# Patient Record
Sex: Male | Born: 1990
Health system: Southern US, Community
[De-identification: ages and names within clinical notes are randomized; demographics above are authoritative.]

## PROBLEM LIST (undated history)

## (undated) DIAGNOSIS — Z9889 Other specified postprocedural states: Secondary | ICD-10-CM

## (undated) DIAGNOSIS — Z87898 Personal history of other specified conditions: Secondary | ICD-10-CM

## (undated) DIAGNOSIS — I499 Cardiac arrhythmia, unspecified: Secondary | ICD-10-CM

## (undated) DIAGNOSIS — T8384XA Pain from genitourinary prosthetic devices, implants and grafts, initial encounter: Secondary | ICD-10-CM

## (undated) DIAGNOSIS — T8859XA Other complications of anesthesia, initial encounter: Secondary | ICD-10-CM

## (undated) DIAGNOSIS — R112 Nausea with vomiting, unspecified: Secondary | ICD-10-CM

## (undated) DIAGNOSIS — Z8619 Personal history of other infectious and parasitic diseases: Secondary | ICD-10-CM

## (undated) DIAGNOSIS — E039 Hypothyroidism, unspecified: Secondary | ICD-10-CM

## (undated) DIAGNOSIS — G43909 Migraine, unspecified, not intractable, without status migrainosus: Secondary | ICD-10-CM

## (undated) DIAGNOSIS — Z973 Presence of spectacles and contact lenses: Secondary | ICD-10-CM

## (undated) DIAGNOSIS — M419 Scoliosis, unspecified: Secondary | ICD-10-CM

## (undated) DIAGNOSIS — T4145XA Adverse effect of unspecified anesthetic, initial encounter: Secondary | ICD-10-CM

## (undated) DIAGNOSIS — Z87442 Personal history of urinary calculi: Secondary | ICD-10-CM

## (undated) DIAGNOSIS — N201 Calculus of ureter: Secondary | ICD-10-CM

## (undated) DIAGNOSIS — N200 Calculus of kidney: Secondary | ICD-10-CM

## (undated) DIAGNOSIS — N318 Other neuromuscular dysfunction of bladder: Secondary | ICD-10-CM

## (undated) HISTORY — PX: EXTRACORPOREAL SHOCK WAVE LITHOTRIPSY: SHX1557

## (undated) HISTORY — PX: OTHER SURGICAL HISTORY: SHX169

## (undated) HISTORY — PX: SVT ABLATION: EP1225

---
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
## 2011-01-08 ENCOUNTER — Emergency Department (HOSPITAL_COMMUNITY)
Admission: EM | Admit: 2011-01-08 | Discharge: 2011-01-08 | Payer: Self-pay | Source: Home / Self Care | Admitting: Emergency Medicine

## 2011-01-11 LAB — BASIC METABOLIC PANEL
BUN: 12 mg/dL (ref 6–23)
CO2: 26 mEq/L (ref 19–32)
Calcium: 9.5 mg/dL (ref 8.4–10.5)
Chloride: 104 mEq/L (ref 96–112)
Creatinine, Ser: 1.01 mg/dL (ref 0.4–1.5)
GFR calc Af Amer: >60 mL/min (ref >60)
GFR calc non Af Amer: >60 mL/min (ref >60)
Glucose, Bld: 109 mg/dL — ABNORMAL HIGH (ref 70–99)
Potassium: 3.8 mEq/L (ref 3.5–5.1)
Sodium: 140 mEq/L (ref 135–145)

## 2011-07-17 IMAGING — CT CT ABD-PELV W/O CM
3 of 4 series · 10 of 46 positions shown, 17 images · non-contrast
Comparison: None.

CLINICAL DATA: Left flank pain.  No history of kidney stones.

CT ABDOMEN AND PELVIS WITHOUT CONTRAST
TECHNIQUE: Multidetector CT imaging of the abdomen and pelvis was
performed following the standard protocol without intravenous
contrast.

[Series 3: lung 5.0 b60f · axial · 0.72mm/px · z∈[-110,-10]mm · 6 of 29 slices shown, 11 images]
[im 5/29  soft-tissue]
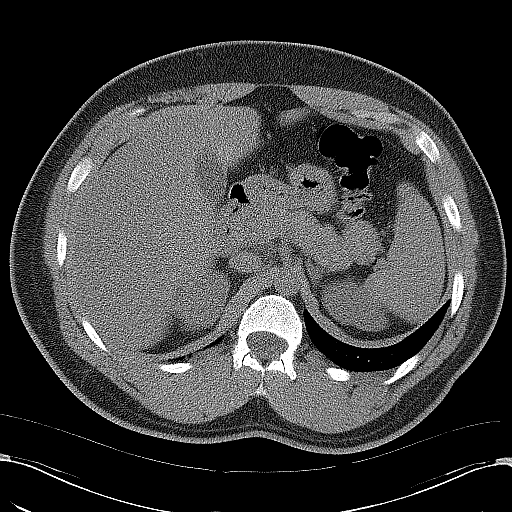
[im 5/29  bone]
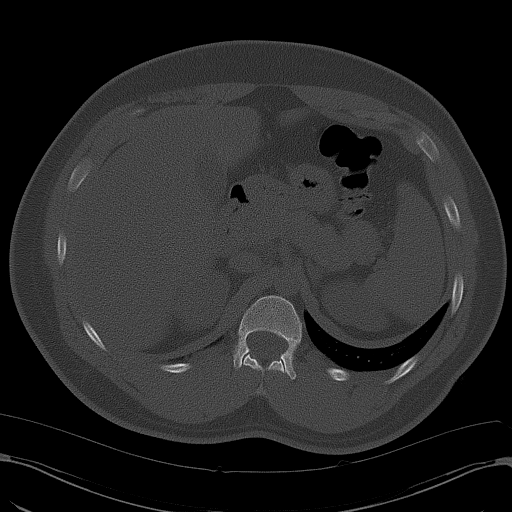
[im 9/29  soft-tissue]
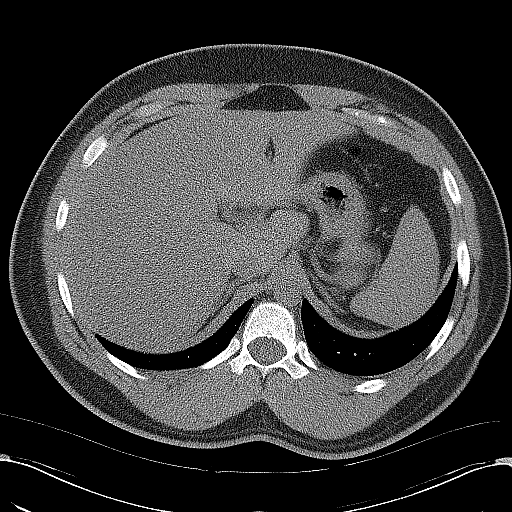
[im 13/29  soft-tissue]
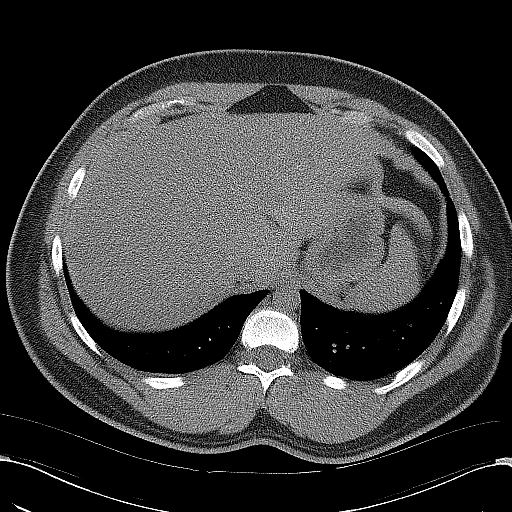
[im 13/29  lung]
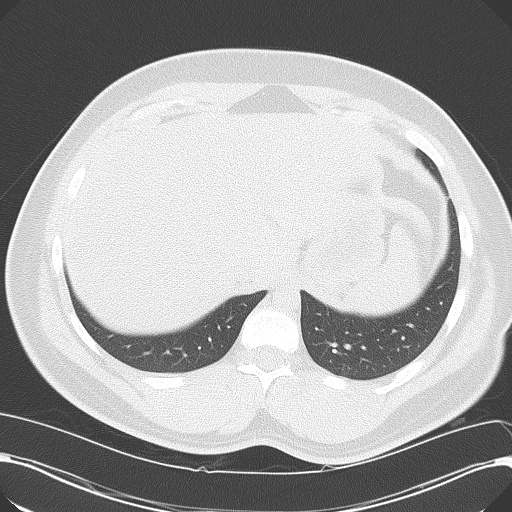
[im 17/29  soft-tissue]
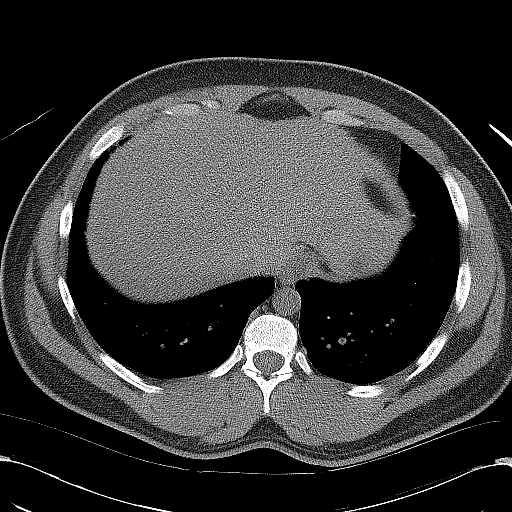
[im 17/29  lung]
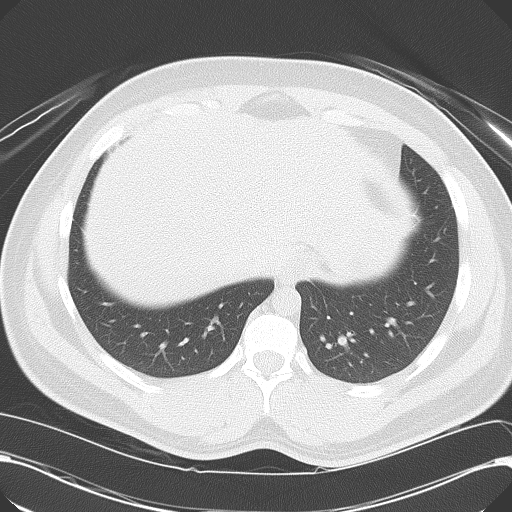
[im 21/29  soft-tissue]
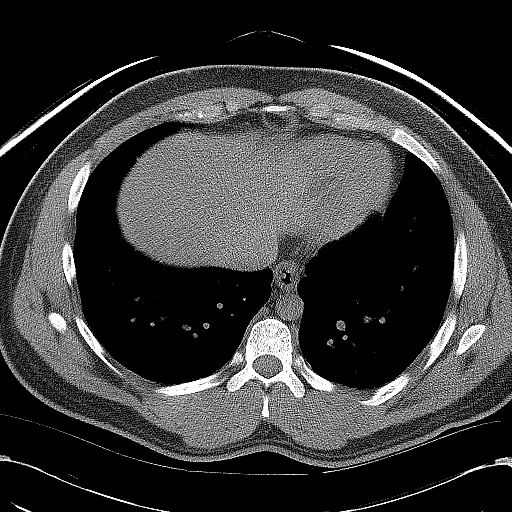
[im 21/29  lung]
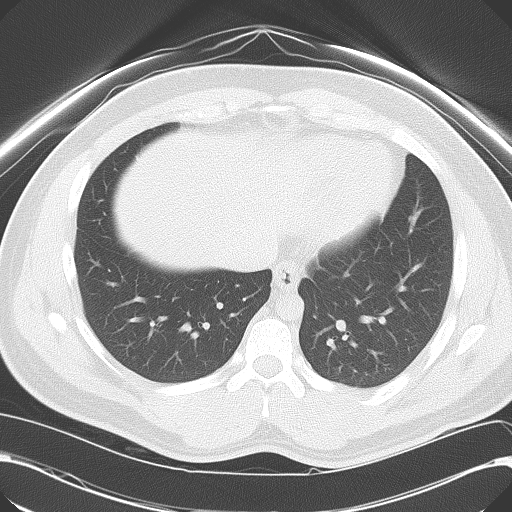
[im 25/29  soft-tissue]
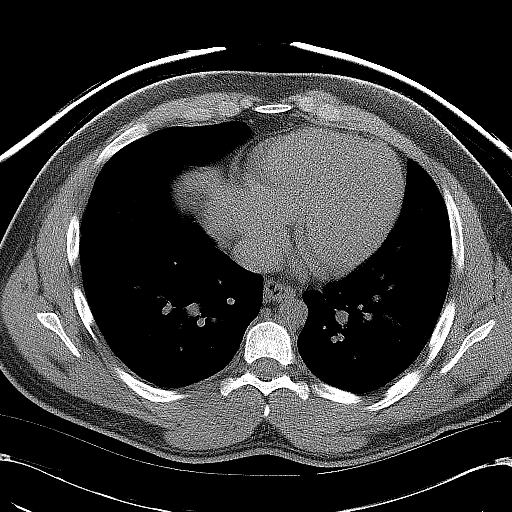
[im 25/29  lung]
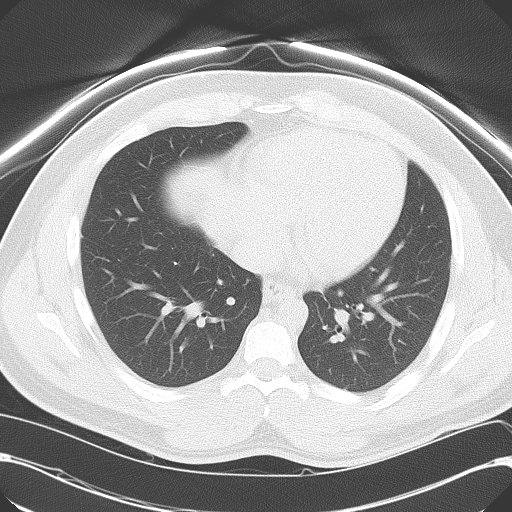

[Series 4: mpr coronal (id) · coronal · 0.80mm/px · 3 of 89 slices shown, 4 images]
[im 30/89  soft-tissue]
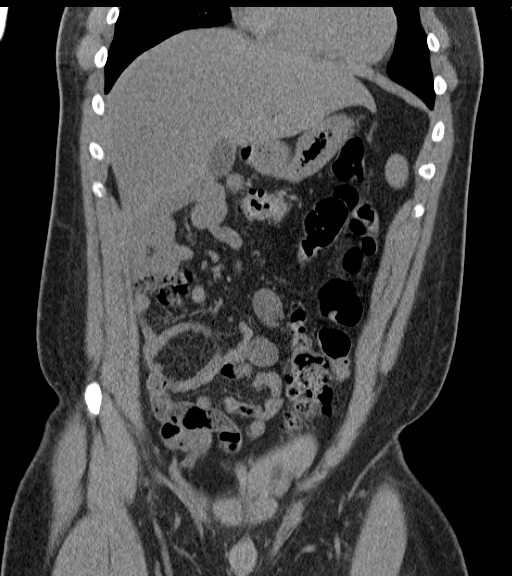
[im 40/89  soft-tissue]
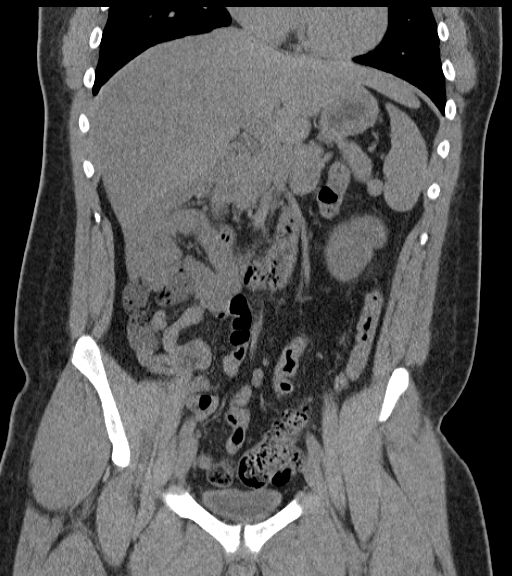
[im 40/89  bone]
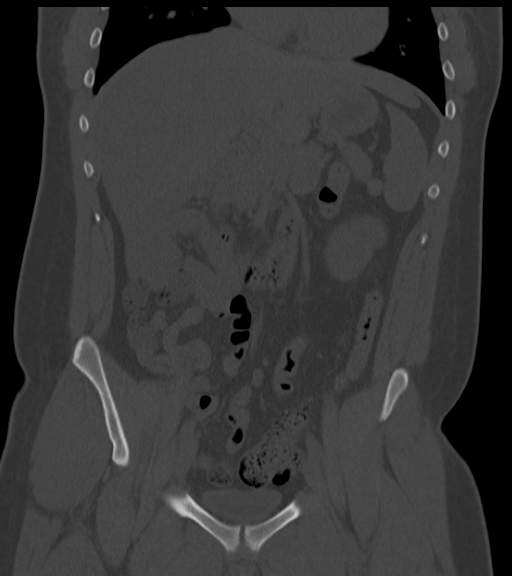
[im 49/89  soft-tissue]
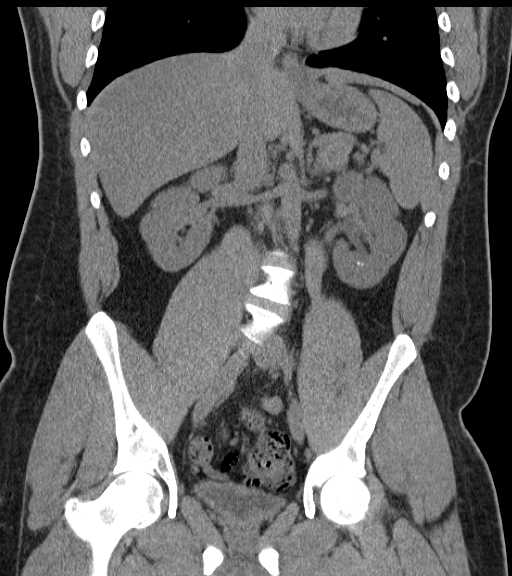

[Series 5: mpr sagittal (id) · sagittal · 0.61mm/px · 1 of 120 slices shown, 2 images]
[im 40/120  soft-tissue]
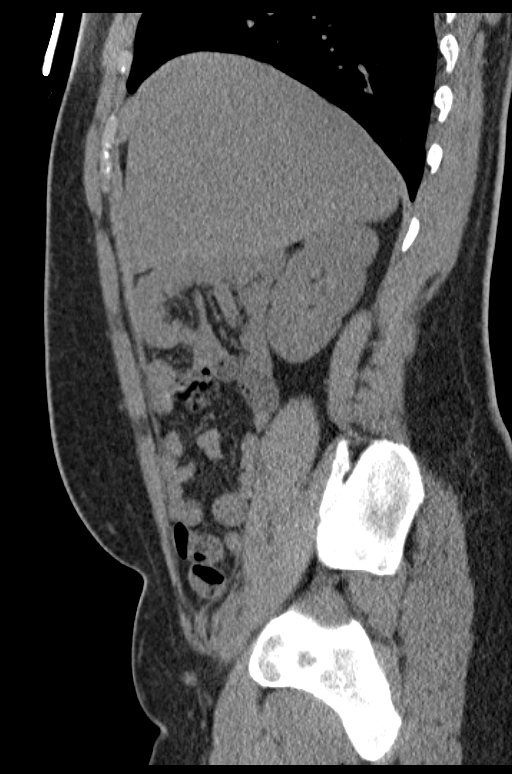
[im 40/120  bone]
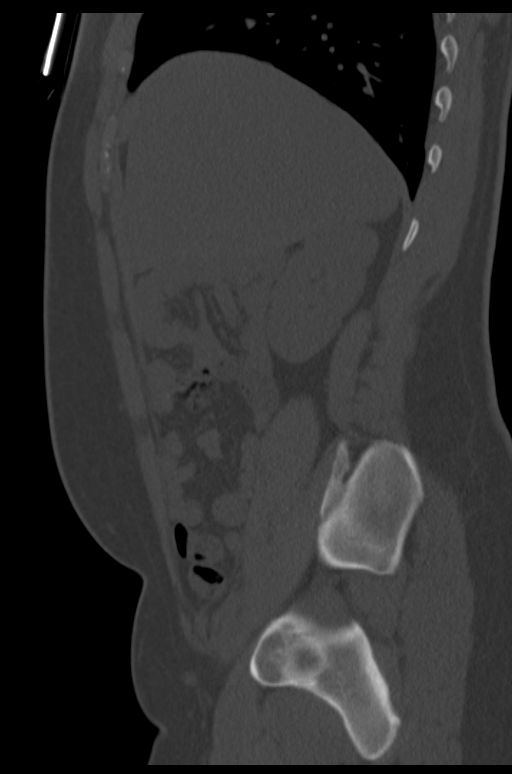

[10 of 46 positions shown; findings below may reference images not displayed]

FINDINGS: There are several calyceal calculi in the lower pole of
the left kidney, the largest measuring 6 mm on image 40.  There is
mild left-sided hydronephrosis without appreciable perinephric soft
tissue stranding.  There is a subtle 2 mm calculus in the proximal
left ureter seen on axial image 48.  This is confirmed on the
reformatted images and is located at the L4-L5 disc space level.
No right-sided renal calculi are demonstrated.

The lung bases are clear.  The liver demonstrates diffuse steatosis
without focal abnormality.  The spleen, gallbladder, pancreas and
adrenal glands appear normal.

The bowel gas pattern appears normal.  No intra-abdominal
inflammatory changes are demonstrated.

There is a moderate convex left lower lumbar scoliosis.  No focal
osseous abnormalities are identified.
IMPRESSION: 1.  2 mm obstructing proximal left ureteral calculus.
2.  Several calyceal calculi are present in the lower pole of the
left kidney.
3.  Hepatic steatosis.
4.  Convex left lumbar scoliosis.

## 2011-09-12 ENCOUNTER — Emergency Department (HOSPITAL_COMMUNITY)
Admission: EM | Admit: 2011-09-12 | Discharge: 2011-09-13 | Disposition: A | Discharge status: Home / Self Care | Payer: No Typology Code available for payment source | Attending: Emergency Medicine | Admitting: Emergency Medicine

## 2011-09-12 DIAGNOSIS — Z87442 Personal history of urinary calculi: Secondary | ICD-10-CM | POA: Insufficient documentation

## 2011-09-12 DIAGNOSIS — R110 Nausea: Secondary | ICD-10-CM | POA: Insufficient documentation

## 2011-09-12 DIAGNOSIS — R109 Unspecified abdominal pain: Secondary | ICD-10-CM | POA: Insufficient documentation

## 2011-09-12 DIAGNOSIS — N23 Unspecified renal colic: Secondary | ICD-10-CM

## 2011-09-12 MED ORDER — KETOROLAC TROMETHAMINE 30 MG/ML IJ SOLN
30.0000 mg | Freq: Once | INTRAMUSCULAR | Status: AC
  Administered 2011-09-13: 30 mg via INTRAVENOUS
  Filled 2011-09-12: qty 1

## 2011-09-12 MED ORDER — HYDROMORPHONE HCL 1 MG/ML IJ SOLN
1.0000 mg | Freq: Once | INTRAMUSCULAR | Status: AC
  Administered 2011-09-13: 1 mg via INTRAVENOUS
  Filled 2011-09-12: qty 1

## 2011-09-12 MED ORDER — ONDANSETRON HCL 4 MG/2ML IJ SOLN
4.0000 mg | Freq: Once | INTRAMUSCULAR | Status: AC
  Administered 2011-09-13: 4 mg via INTRAVENOUS
  Filled 2011-09-12: qty 2

## 2011-09-12 MED ORDER — SODIUM CHLORIDE 0.9 % IV BOLUS (SEPSIS)
1000.0000 mL | Freq: Once | INTRAVENOUS | Status: AC
  Administered 2011-09-13: 1000 mL via INTRAVENOUS

## 2011-09-12 NOTE — ED Provider Notes (Signed)
History     CSN: [id number] Arrival date & time: 09/12/2011 11:51 PM  Chief Complaint  Patient presents with  . Nephrolithiasis    HPI  (Consider location/radiation/quality/duration/timing/severity/associated sxs/prior treatment)  Patient is a 20 y.o. male presenting with flank pain. The history is provided by the patient.  Flank Pain This is a new problem. The current episode started 1 to 2 hours ago. The problem occurs constantly. Pertinent negatives include no chest pain, no headaches and no shortness of breath.   patient experienced sudden onset of left flank flank pain tonight. He has a history of kidney stones once in the past, and is followed by [facility]. He has nausea but no vomiting. Pain radiates from his flank to his left lower quadrant of his abdomen. Quality is sharp. Pain is severe. No associated hematuria or diarrhea. Duration has been constant since onset  Past Medical History  Diagnosis Date  . Kidney stone     Past Surgical History  Procedure Date  . Foot capsule release w/ percutaneous heel cord lengthening, tibial tendon transfer   . Lymph gland excision     No family history on file.  History  Substance Use Topics  . Smoking status: Never Smoker   . Smokeless tobacco: Not on file  . Alcohol Use: Yes     occ      Review of Systems  Review of Systems  Constitutional: Negative for fever and chills.  HENT: Negative for neck pain and neck stiffness.   Eyes: Negative for pain.  Respiratory: Negative for shortness of breath.   Cardiovascular: Negative for chest pain and palpitations.  Gastrointestinal: Positive for nausea. Negative for vomiting and rectal pain.  Genitourinary: Positive for flank pain. Negative for dysuria, frequency, hematuria, penile pain and testicular pain.  Musculoskeletal: Negative for back pain.  Skin: Negative for rash.  Neurological: Negative for headaches.  All other systems reviewed and are  negative.    Allergies  Codeine  Home Medications  No current outpatient prescriptions on file.  Physical Exam    BP 130/89  Pulse 75  Temp(Src) 98.3 F (36.8 C) (Oral)  Resp 20  Ht 5' 8.5" (1.74 m)  Wt 210 lb (95.255 kg)  BMI 31.47 kg/m2  SpO2 100%  Physical Exam  Constitutional: He is oriented to person, place, and time. He appears well-developed and well-nourished.  HENT:  Head: Normocephalic and atraumatic.  Eyes: Conjunctivae and EOM are normal. Pupils are equal, round, and reactive to light.  Neck: Trachea normal. Neck supple. No thyromegaly present.  Cardiovascular: Normal rate, regular rhythm, S1 normal, S2 normal and normal pulses.     No systolic murmur is present   No diastolic murmur is present  Pulses:      Radial pulses are 2+ on the right side, and 2+ on the left side.  Pulmonary/Chest: Effort normal and breath sounds normal. He has no wheezes. He has no rhonchi. He has no rales. He exhibits no tenderness.  Abdominal: Soft. Normal appearance and bowel sounds are normal. There is no CVA tenderness and negative Murphy's sign.       Pain localized to left flank without any reproducible tenderness. No guarding, no rebound, no peritonitis. No discoloration.  Neurological: He is alert and oriented to person, place, and time. He has normal strength. No cranial nerve deficit or sensory deficit. GCS eye subscore is 4. GCS verbal subscore is 5. GCS motor subscore is 6.  Skin: Skin is warm and dry. No rash noted.  He is not diaphoretic.  Psychiatric: His speech is normal.       Cooperative and appropriate    ED Course  Procedures (including critical care time)   Ct Abdomen Pelvis Wo Contrast  09/13/2011  *RADIOLOGY REPORT*  Clinical Data: Left-sided flank pain and pelvic pain; history of renal stones.  CT ABDOMEN AND PELVIS WITHOUT CONTRAST  Technique:  Multidetector CT imaging of the abdomen and pelvis was performed following the standard protocol without  intravenous contrast.  Comparison: CT of the abdomen and pelvis performed 01/08/2011  Findings: The visualized lung bases are clear.  The liver and spleen are unremarkable in appearance.  The gallbladder is within normal limits.  The pancreas and adrenal glands are unremarkable.  There is no evidence of hydronephrosis.  No obstructing ureteral stones are seen.  Two nonobstructing renal stones are noted at the lower pole of the left kidney, measuring 0.8 cm and 0.5 cm in size. Bilateral fetal lobulations are seen.  No perinephric stranding is appreciated.  No free fluid is identified.  The small bowel is grossly unremarkable in appearance; it is entirely on the right side of the abdomen.  The stomach is within normal limits.  No acute vascular abnormalities are seen.  The colon is entirely on the left side of the abdomen, compatible with malrotation.  The cecum is deep within the pelvis.  The appendix is normal in caliber, directly superior to the bladder, without evidence for appendicitis.  The colon is otherwise unremarkable in appearance.  The bladder is decompressed and not well assessed.  The prostate remains normal in size.  No inguinal lymphadenopathy is seen.  No acute osseous abnormalities are identified.  Left convex lumbar scoliosis is noted.  IMPRESSION:  1.  No evidence of hydronephrosis; no obstructing ureteral stones seen. 2.  Two nonobstructing renal stones at the lower pole of the left kidney, measuring 0.8 cm and 0.5 cm in size. 3.  Small bowel noted on the right side of the abdomen, while the colon is seen on the left side of the abdomen, compatible with malrotation.  The appendix remains normal in appearance, seen directly superior to the bladder. 4.  Left convex lumbar scoliosis again noted.  Original Report Authenticated By: Tonia Ghent, M.D.    MDM  11:55 PM patient evaluated. Presentation consistent with left ureteral colic. IV fluids and IV pain medications provided. IV Zofran for  nausea. Urinalysis obtained. CT scan obtained to further evaluate.  1:44 AM on recheck patient is feeling much better, and much more comfortable. CT scan was reviewed as above.  exam remains unchanged without any tenderness elicited on exam and no peritonitis. Plan discharge home with pain medications and followup with urologist as needed. Patient states understanding strict return precautions for any fevers, vomiting or worsening condition.      Sunnie Nielsen, MD 09/13/11 [phone]

## 2011-09-12 NOTE — ED Notes (Signed)
Left lower ab pain that started tonight, h/o stones, told he had 4 and feels like one is moving, +n/v

## 2011-09-13 ENCOUNTER — Emergency Department (HOSPITAL_COMMUNITY): Payer: No Typology Code available for payment source

## 2011-09-13 LAB — URINALYSIS, ROUTINE W REFLEX MICROSCOPIC
Bilirubin Urine: NEGATIVE
Glucose, UA: NEGATIVE mg/dL
Hgb urine dipstick: NEGATIVE
Ketones, ur: NEGATIVE mg/dL
Leukocytes, UA: NEGATIVE
Nitrite: NEGATIVE
Protein, ur: NEGATIVE mg/dL
Specific Gravity, Urine: 1.02 (ref 1.005–1.030)
Urobilinogen, UA: 0.2 mg/dL (ref 0.0–1.0)
pH: 8 (ref 5.0–8.0)

## 2011-09-13 MED ORDER — IBUPROFEN 800 MG PO TABS: 800.0000 mg | ORAL_TABLET | Freq: Three times a day (TID) | ORAL | Status: AC

## 2011-09-13 MED ORDER — ONDANSETRON HCL 4 MG PO TABS: 4.0000 mg | ORAL_TABLET | Freq: Four times a day (QID) | ORAL | Status: AC

## 2011-09-13 MED ORDER — HYDROCODONE-ACETAMINOPHEN 7.5-500 MG/15ML PO SOLN: 15.0000 mL | Freq: Four times a day (QID) | ORAL | Status: AC | PRN

## 2012-03-21 IMAGING — CT CT ABD-PELV W/O CM
2 of 3 series · 9 of 46 positions shown, 11 images · non-contrast
Comparison: CT of the abdomen and pelvis performed 01/08/2011

CLINICAL DATA: Left-sided flank pain and pelvic pain; history of
renal stones.

CT ABDOMEN AND PELVIS WITHOUT CONTRAST
TECHNIQUE: Multidetector CT imaging of the abdomen and pelvis was
performed following the standard protocol without intravenous
contrast.

[Series 4: mpr coronal (id) · coronal · 0.79mm/px · 8 of 87 slices shown, 9 images]
[im 10/87  soft-tissue]
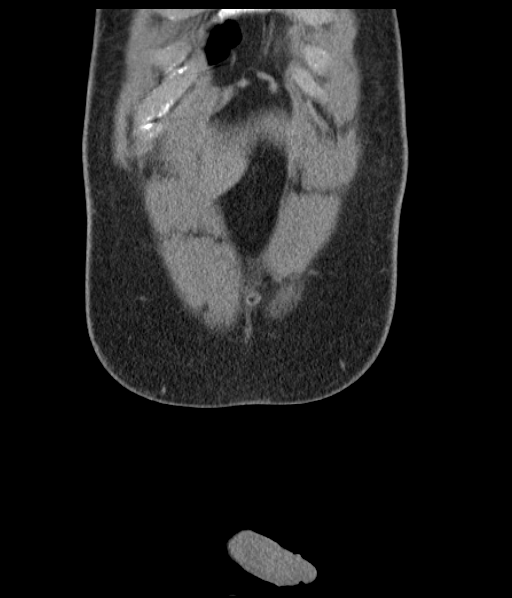
[im 10/87  bone]
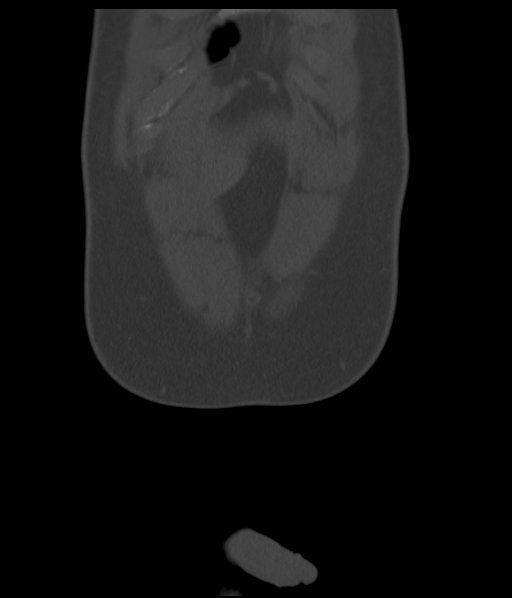
[im 20/87  soft-tissue]
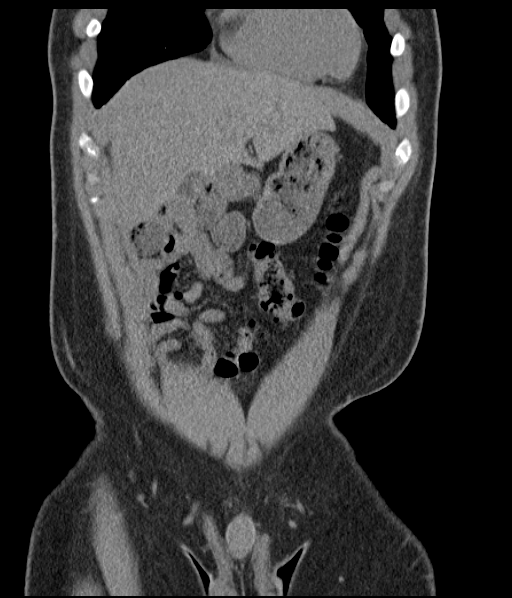
[im 29/87  soft-tissue]
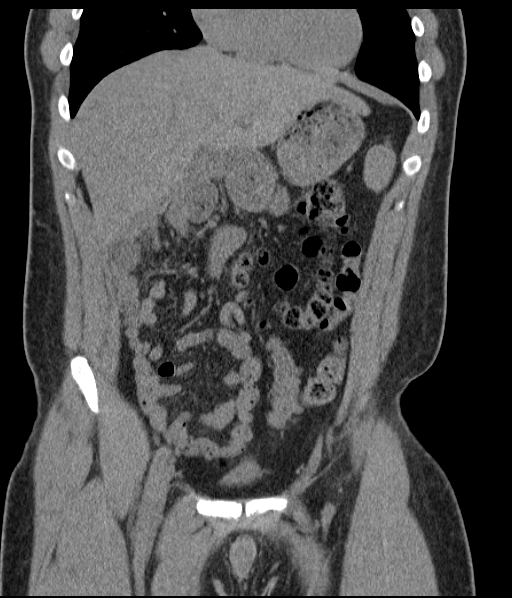
[im 39/87  soft-tissue]
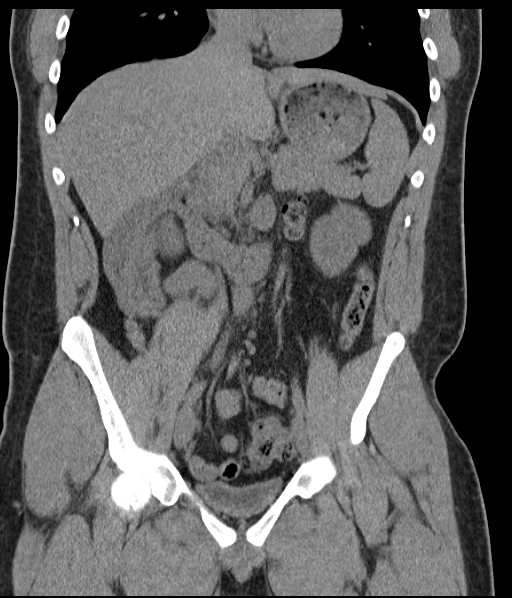
[im 48/87  soft-tissue]
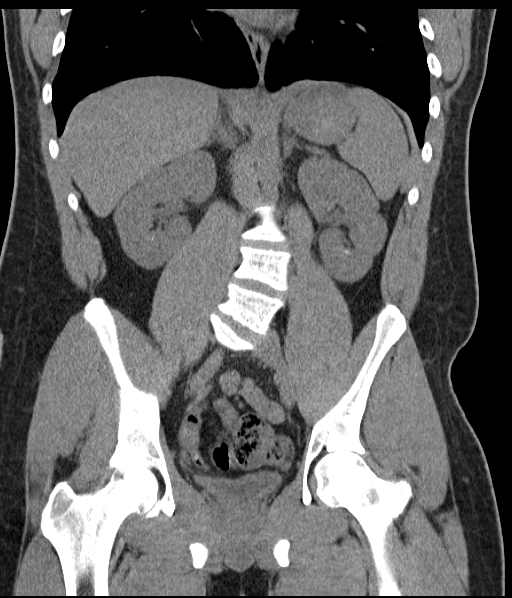
[im 58/87  soft-tissue]
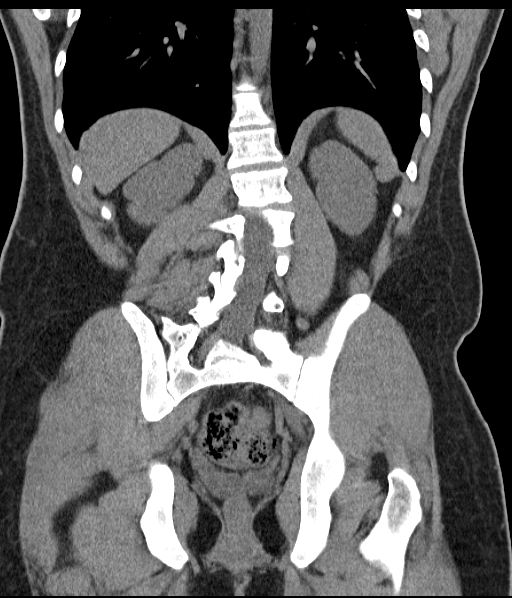
[im 67/87  soft-tissue]
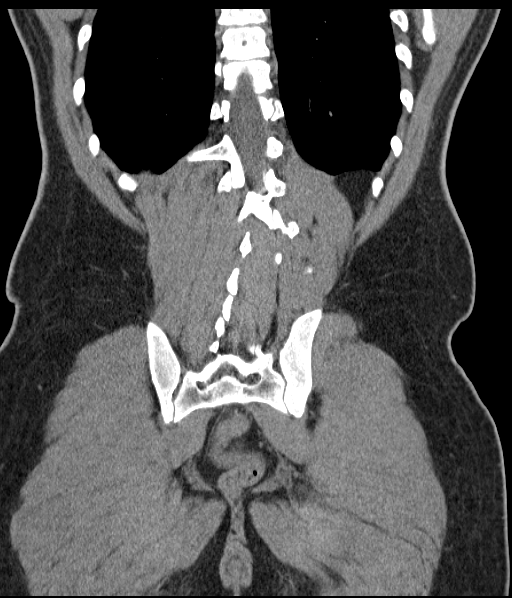
[im 77/87  soft-tissue]
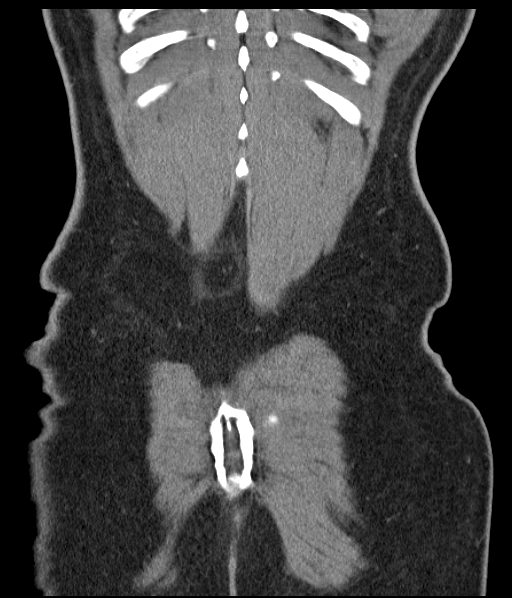

[Series 5: mpr sagittal (id) · sagittal · 0.58mm/px · 1 of 122 slices shown, 2 images]
[im 41/122  soft-tissue]
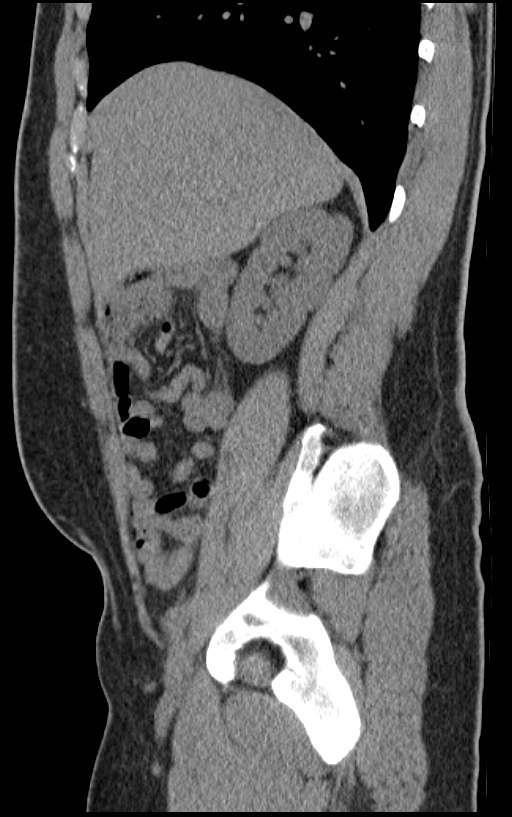
[im 41/122  bone]
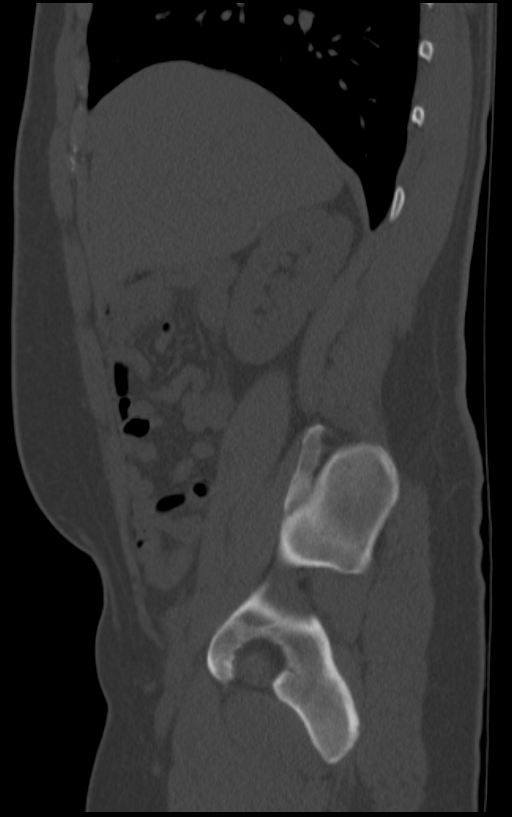

[9 of 46 positions shown; findings below may reference images not displayed]

FINDINGS: The visualized lung bases are clear.

The liver and spleen are unremarkable in appearance.  The
gallbladder is within normal limits.  The pancreas and adrenal
glands are unremarkable.

There is no evidence of hydronephrosis.  No obstructing ureteral
stones are seen.  Two nonobstructing renal stones are noted at the
lower pole of the left kidney, measuring 0.8 cm and 0.5 cm in size.
Bilateral fetal lobulations are seen.  No perinephric stranding is
appreciated.

No free fluid is identified.  The small bowel is grossly
unremarkable in appearance; it is entirely on the right side of the
abdomen.  The stomach is within normal limits.  No acute vascular
abnormalities are seen.

The colon is entirely on the left side of the abdomen, compatible
with malrotation.  The cecum is deep within the pelvis.  The
appendix is normal in caliber, directly superior to the bladder,
without evidence for appendicitis.  The colon is otherwise
unremarkable in appearance.

The bladder is decompressed and not well assessed.  The prostate
remains normal in size.  No inguinal lymphadenopathy is seen.

No acute osseous abnormalities are identified.  Left convex lumbar
scoliosis is noted.
IMPRESSION: 1.  No evidence of hydronephrosis; no obstructing ureteral stones
seen.
2.  Two nonobstructing renal stones at the lower pole of the left
kidney, measuring 0.8 cm and 0.5 cm in size.
3.  Small bowel noted on the right side of the abdomen, while the
colon is seen on the left side of the abdomen, compatible with
malrotation.  The appendix remains normal in appearance, seen
directly superior to the bladder.
4.  Left convex lumbar scoliosis again noted.

## 2013-03-17 ENCOUNTER — Emergency Department (HOSPITAL_COMMUNITY)
Admission: EM | Admit: 2013-03-17 | Discharge: 2013-03-17 | Disposition: A | Discharge status: Home / Self Care | Payer: BC Managed Care – PPO | Attending: Emergency Medicine | Admitting: Emergency Medicine

## 2013-03-17 ENCOUNTER — Encounter (HOSPITAL_COMMUNITY): Payer: Self-pay | Admitting: *Deleted

## 2013-03-17 ENCOUNTER — Emergency Department (HOSPITAL_COMMUNITY): Payer: BC Managed Care – PPO

## 2013-03-17 DIAGNOSIS — R3915 Urgency of urination: Secondary | ICD-10-CM | POA: Insufficient documentation

## 2013-03-17 DIAGNOSIS — N200 Calculus of kidney: Secondary | ICD-10-CM

## 2013-03-17 DIAGNOSIS — R112 Nausea with vomiting, unspecified: Secondary | ICD-10-CM | POA: Insufficient documentation

## 2013-03-17 LAB — URINE MICROSCOPIC-ADD ON

## 2013-03-17 LAB — URINALYSIS, ROUTINE W REFLEX MICROSCOPIC
Bilirubin Urine: NEGATIVE
Glucose, UA: NEGATIVE mg/dL
Ketones, ur: NEGATIVE mg/dL
Leukocytes, UA: NEGATIVE
Nitrite: NEGATIVE
Protein, ur: NEGATIVE mg/dL
Specific Gravity, Urine: 1.02 (ref 1.005–1.030)
Urobilinogen, UA: 0.2 mg/dL (ref 0.0–1.0)
pH: 7 (ref 5.0–8.0)

## 2013-03-17 MED ORDER — ONDANSETRON HCL 4 MG/2ML IJ SOLN
4.0000 mg | Freq: Once | INTRAMUSCULAR | Status: AC
  Administered 2013-03-17: 4 mg via INTRAVENOUS
  Filled 2013-03-17: qty 2

## 2013-03-17 MED ORDER — HYDROMORPHONE HCL PF 1 MG/ML IJ SOLN
1.0000 mg | Freq: Once | INTRAMUSCULAR | Status: AC
  Administered 2013-03-17: 1 mg via INTRAVENOUS
  Filled 2013-03-17: qty 1

## 2013-03-17 MED ORDER — PROMETHAZINE HCL 25 MG PO TABS: 25.0000 mg | ORAL_TABLET | Freq: Four times a day (QID) | ORAL | Status: DC | PRN

## 2013-03-17 MED ORDER — OXYCODONE-ACETAMINOPHEN 5-325 MG PO TABS: 2.0000 | ORAL_TABLET | ORAL | Status: DC | PRN

## 2013-03-17 NOTE — ED Notes (Signed)
Discharge instructions reviewed with pt, questions answered. Pt verbalized understanding.  

## 2013-03-17 NOTE — ED Provider Notes (Signed)
History  This chart was scribed for Donnetta Hutching, MD by Erskine Emery, ED Scribe. This patient was seen in room APA12/APA12 and the patient's care was started at 07:35.   CSN: [id number]  Arrival date & time 03/17/13  0718   First MD Initiated Contact with Patient 03/17/13 [phone]      Chief Complaint  Patient presents with  . Flank Pain  . Nausea    (Consider location/radiation/quality/duration/timing/severity/associated sxs/prior treatment) The history is provided by the patient and a relative. No language interpreter was used.  Thomas Howell is a 22 y.o. male who presents to the Emergency Department complaining of sudden onset urinary urgency and sharp left flank pain that radiates to the LLQ of the abdomen since just PTA. Pt reports some associated nausea and emesis but denies any hematuria. Pt has a h/o 4 kidney stones and states this episode feels similar. Pt is allergic to codeine but has taken Dilaudid successfully for previous episodes. Pt reports he has had a lot of CT scans in his life for various issues. Pt has no other medical conditions or complaints at this time.  Past Medical History  Diagnosis Date  . Kidney stone     Past Surgical History  Procedure Laterality Date  . Foot capsule release w/ percutaneous heel cord lengthening, tibial tendon transfer    . Lymph gland excision      History reviewed. No pertinent family history.  History  Substance Use Topics  . Smoking status: Never Smoker   . Smokeless tobacco: Not on file  . Alcohol Use: Yes     Comment: occ      Review of Systems A complete 10 system review of systems was obtained and all systems are negative except as noted in the HPI and PMH.    Allergies  Codeine  Home Medications  No current outpatient prescriptions on file.  There were no vitals taken for this visit.  Physical Exam  Nursing note and vitals reviewed. Constitutional: He is oriented to person, place, and time. He appears  well-developed and well-nourished.  HENT:  Head: Normocephalic and atraumatic.  Eyes: Conjunctivae and EOM are normal. Pupils are equal, round, and reactive to light.  Neck: Normal range of motion. Neck supple.  Cardiovascular: Normal rate, regular rhythm and normal heart sounds.   Pulmonary/Chest: Effort normal and breath sounds normal.  Abdominal: Soft. Bowel sounds are normal.  Genitourinary:  Tender in left flank that radiates into LLQ.  Musculoskeletal: Normal range of motion.  Neurological: He is alert and oriented to person, place, and time.  Skin: Skin is warm and dry.  Psychiatric: He has a normal mood and affect.    ED Course  Procedures (including critical care time) DIAGNOSTIC STUDIES:  COORDINATION OF CARE: 07:55--I evaluated the patient and we discussed a treatment plan including urinalysis, Zofran, and pain medication (Dilaudid) to which the pt agreed.   Results for orders placed during the hospital encounter of 03/17/13  URINALYSIS, ROUTINE W REFLEX MICROSCOPIC      Result Value Range   Color, Urine YELLOW  YELLOW   APPearance CLEAR  CLEAR   Specific Gravity, Urine 1.020  1.005 - 1.030   pH 7.0  5.0 - 8.0   Glucose, UA NEGATIVE  NEGATIVE mg/dL   Hgb urine dipstick LARGE (*) NEGATIVE   Bilirubin Urine NEGATIVE  NEGATIVE   Ketones, ur NEGATIVE  NEGATIVE mg/dL   Protein, ur NEGATIVE  NEGATIVE mg/dL   Urobilinogen, UA 0.2  0.0 -  1.0 mg/dL   Nitrite NEGATIVE  NEGATIVE   Leukocytes, UA NEGATIVE  NEGATIVE  URINE MICROSCOPIC-ADD ON      Result Value Range   Squamous Epithelial / LPF RARE  RARE   WBC, UA 0-2  <3 WBC/hpf   RBC / HPF 21-50  <3 RBC/hpf   Bacteria, UA FEW (*) RARE   Dg Abd 1 View  03/17/2013  *RADIOLOGY REPORT*  Clinical Data: Left flank pain and nausea.  History of urolithiasis.  ABDOMEN - 1 VIEW  Comparison: CT urogram 09/13/2011.  Findings: Left renal calculi are again noted.  There is a new faint radiodensity in the left pelvis, measuring 2 mm.   This was not seen on the prior CT and could reflect a small distal ureteral calculus. The bowel gas pattern is normal.  A moderate convex left scoliosis is again noted.  IMPRESSION: Possible new small left pelvic calcification suspicious for urolithiasis.  Correlate clinically.  Grossly stable left renal calculi.   Original Report Authenticated By: Carey Bullocks, M.D.       No diagnosis found.    MDM  History and physical suggest stone.  21-50 red cells in urine.  Will avoid CT scan secondary to multiple scans in the past.  Patient is stable after pain management. Discharge meds Percocet #20 and Phenergan 25 mg #20      I personally performed the services described in this documentation, which was scribed in my presence. The recorded information has been reviewed and is accurate.    Donnetta Hutching, MD 03/17/13 [phone]

## 2013-03-17 NOTE — ED Notes (Signed)
Pt co pain to lt flank with N/V. Pt has HX of kidney stones states it feels the same way as past events.

## 2013-03-17 NOTE — ED Notes (Signed)
Pt has a history of kidney stones. The patient states he has nausea and vomiting, and left flank pain radiating to back and groin area.

## 2014-02-05 ENCOUNTER — Emergency Department (HOSPITAL_COMMUNITY)
Admission: EM | Admit: 2014-02-05 | Discharge: 2014-02-05 | Disposition: A | Discharge status: Home / Self Care | Payer: No Typology Code available for payment source | Attending: Emergency Medicine | Admitting: Emergency Medicine

## 2014-02-05 ENCOUNTER — Encounter (HOSPITAL_COMMUNITY): Payer: Self-pay | Admitting: Emergency Medicine

## 2014-02-05 DIAGNOSIS — R1110 Vomiting, unspecified: Secondary | ICD-10-CM | POA: Insufficient documentation

## 2014-02-05 DIAGNOSIS — Z87442 Personal history of urinary calculi: Secondary | ICD-10-CM | POA: Insufficient documentation

## 2014-02-05 DIAGNOSIS — R109 Unspecified abdominal pain: Secondary | ICD-10-CM | POA: Insufficient documentation

## 2014-02-05 DIAGNOSIS — R319 Hematuria, unspecified: Secondary | ICD-10-CM | POA: Insufficient documentation

## 2014-02-05 DIAGNOSIS — Z8679 Personal history of other diseases of the circulatory system: Secondary | ICD-10-CM | POA: Insufficient documentation

## 2014-02-05 HISTORY — DX: Migraine, unspecified, not intractable, without status migrainosus: G43.909

## 2014-02-05 LAB — URINALYSIS, ROUTINE W REFLEX MICROSCOPIC
Glucose, UA: NEGATIVE mg/dL
Ketones, ur: NEGATIVE mg/dL
Leukocytes, UA: NEGATIVE
Nitrite: NEGATIVE
Protein, ur: 30 mg/dL — AB
Specific Gravity, Urine: 1.02 (ref 1.005–1.030)
Urobilinogen, UA: 0.2 mg/dL (ref 0.0–1.0)
pH: 7 (ref 5.0–8.0)

## 2014-02-05 LAB — URINE MICROSCOPIC-ADD ON

## 2014-02-05 MED ORDER — ONDANSETRON HCL 4 MG/2ML IJ SOLN
4.0000 mg | Freq: Once | INTRAMUSCULAR | Status: AC
  Administered 2014-02-05: 4 mg via INTRAVENOUS
  Filled 2014-02-05: qty 2

## 2014-02-05 MED ORDER — KETOROLAC TROMETHAMINE 30 MG/ML IJ SOLN
30.0000 mg | Freq: Once | INTRAMUSCULAR | Status: AC
  Administered 2014-02-05: 30 mg via INTRAVENOUS
  Filled 2014-02-05: qty 1

## 2014-02-05 MED ORDER — ONDANSETRON 8 MG PO TBDP: 8.0000 mg | ORAL_TABLET | Freq: Once | ORAL | Status: DC

## 2014-02-05 MED ORDER — OXYCODONE-ACETAMINOPHEN 5-325 MG PO TABS: 1.0000 | ORAL_TABLET | ORAL | Status: DC | PRN

## 2014-02-05 MED ORDER — OXYCODONE-ACETAMINOPHEN 5-325 MG PO TABS: 1.0000 | ORAL_TABLET | Freq: Once | ORAL | Status: DC

## 2014-02-05 MED ORDER — ONDANSETRON HCL 8 MG PO TABS: 8.0000 mg | ORAL_TABLET | Freq: Three times a day (TID) | ORAL | Status: DC | PRN

## 2014-02-05 MED ORDER — HYDROMORPHONE HCL PF 1 MG/ML IJ SOLN
1.0000 mg | Freq: Once | INTRAMUSCULAR | Status: AC
  Administered 2014-02-05: 1 mg via INTRAVENOUS
  Filled 2014-02-05: qty 1

## 2014-02-05 NOTE — ED Provider Notes (Signed)
CSN: [id number]     Arrival date & time 02/05/14  1356 History   First MD Initiated Contact with Patient 02/05/14 1532     Chief Complaint  Patient presents with  . Flank Pain      Patient is a 23 y.o. male presenting with flank pain. The history is provided by the patient.  Flank Pain This is a recurrent problem. The current episode started 6 to 12 hours ago. The problem occurs constantly. The problem has been gradually worsening. Associated symptoms include abdominal pain. Pertinent negatives include no chest pain and no shortness of breath. Exacerbated by: palpation. The symptoms are relieved by rest. He has tried rest for the symptoms. The treatment provided mild relief.    Past Medical History  Diagnosis Date  . Kidney stone   . Migraine    Past Surgical History  Procedure Laterality Date  . Foot capsule release w/ percutaneous heel cord lengthening, tibial tendon transfer    . Lymph gland excision     History reviewed. No pertinent family history. History  Substance Use Topics  . Smoking status: Never Smoker   . Smokeless tobacco: Never Used  . Alcohol Use: Yes     Comment: occ    Review of Systems  Constitutional: Negative for fever.  Respiratory: Negative for shortness of breath.   Cardiovascular: Negative for chest pain.  Gastrointestinal: Positive for vomiting and abdominal pain.  Genitourinary: Positive for flank pain.  All other systems reviewed and are negative.      Allergies  Codeine and Tape  Home Medications   Current Outpatient Rx  Name  Route  Sig  Dispense  Refill  . naproxen sodium (ALEVE) 220 MG tablet   Oral   Take 660 mg by mouth 2 (two) times daily as needed (pain).          BP 124/76  Pulse 79  Temp(Src) 97.7 F (36.5 C) (Oral)  Resp 18  Ht 5\' 9"  (1.753 m)  Wt 210 lb (95.255 kg)  BMI 31.00 kg/m2  SpO2 99% Physical Exam CONSTITUTIONAL: Well developed/well nourished, pt actively vomiting HEAD:  Normocephalic/atraumatic EYES: EOMI/PERRL ENMT: Mucous membranes moist NECK: supple no meningeal signs SPINE:entire spine nontender CV: S1/S2 noted, no murmurs/rubs/gallops noted LUNGS: Lungs are clear to auscultation bilaterally, no apparent distress ABDOMEN: soft, nontender, no rebound or guarding YN:WGNFA cva tenderness, no inguinal hernia, and no scrotal tenderness noted NEURO: Pt is awake/alert, moves all extremitiesx4 EXTREMITIES: pulses normal, full ROM SKIN: warm, color normal PSYCH: no abnormalities of mood noted  ED Course  Procedures (including critical care time) 4:07 PM Pt with h/o kidney stones reports similar type pain here with nausea/vomiting Previous CT imaging reveals nephrolithiasis though usually left sided Will treat pain and reasses 5:20 PM Pt feels improved, no distress noted Do not feel emergent imaging needed due to h/o kidney stones with similar type pain and hematuria We discussed strict return precautions He reports he has taken percocet in past without issue (this was given previously per records)  Labs Review Labs Reviewed  URINALYSIS, ROUTINE W REFLEX MICROSCOPIC - Abnormal; Notable for the following:    Color, Urine RED (*)    APPearance HAZY (*)    Hgb urine dipstick LARGE (*)    Bilirubin Urine SMALL (*)    Protein, ur 30 (*)    All other components within normal limits  URINE MICROSCOPIC-ADD ON - Abnormal; Notable for the following:    Bacteria, UA FEW (*)    All  other components within normal limits     MDM   Final diagnoses:  Flank pain  Hematuria    Nursing notes including past medical history and social history reviewed and considered in documentation Previous records reviewed and considered Labs/vital reviewed and considered     Joya Gaskinsonald W Birch Farino, MD 02/05/14 1721

## 2014-02-05 NOTE — ED Notes (Signed)
Patient c/o pain in right flank that radiates into right lower abd pain. Patient reports dark urine. Patient reports nausea and vomiting. Denies any fevers or diarrhea. Per patient burning with urination. Patient reports hx of kidney stones.

## 2014-02-08 ENCOUNTER — Encounter (HOSPITAL_COMMUNITY): Payer: Self-pay | Admitting: Emergency Medicine

## 2014-02-08 ENCOUNTER — Emergency Department (HOSPITAL_COMMUNITY): Payer: No Typology Code available for payment source

## 2014-02-08 ENCOUNTER — Emergency Department (HOSPITAL_COMMUNITY)
Admission: EM | Admit: 2014-02-08 | Discharge: 2014-02-09 | Disposition: A | Discharge status: Home / Self Care | Payer: Self-pay | Attending: Emergency Medicine | Admitting: Emergency Medicine

## 2014-02-08 DIAGNOSIS — N23 Unspecified renal colic: Secondary | ICD-10-CM

## 2014-02-08 DIAGNOSIS — N200 Calculus of kidney: Secondary | ICD-10-CM | POA: Insufficient documentation

## 2014-02-08 DIAGNOSIS — Z8679 Personal history of other diseases of the circulatory system: Secondary | ICD-10-CM | POA: Insufficient documentation

## 2014-02-08 DIAGNOSIS — R112 Nausea with vomiting, unspecified: Secondary | ICD-10-CM | POA: Insufficient documentation

## 2014-02-08 MED ORDER — TAMSULOSIN HCL 0.4 MG PO CAPS
ORAL_CAPSULE | ORAL | Status: AC
  Filled 2014-02-08: qty 2

## 2014-02-08 MED ORDER — ONDANSETRON 4 MG PO TBDP: 4.0000 mg | ORAL_TABLET | Freq: Once | ORAL | Status: DC

## 2014-02-08 MED ORDER — SODIUM CHLORIDE 0.9 % IV SOLN
Freq: Once | INTRAVENOUS | Status: AC
  Administered 2014-02-08: 23:00:00 via INTRAVENOUS

## 2014-02-08 MED ORDER — ONDANSETRON HCL 4 MG/2ML IJ SOLN
4.0000 mg | Freq: Once | INTRAMUSCULAR | Status: AC
  Administered 2014-02-08: 4 mg via INTRAVENOUS

## 2014-02-08 MED ORDER — TAMSULOSIN HCL 0.4 MG PO CAPS
0.8000 mg | ORAL_CAPSULE | Freq: Once | ORAL | Status: AC
  Administered 2014-02-08: 0.8 mg via ORAL

## 2014-02-08 MED ORDER — ONDANSETRON HCL 4 MG/2ML IJ SOLN
4.0000 mg | Freq: Once | INTRAMUSCULAR | Status: DC
  Filled 2014-02-08: qty 2

## 2014-02-08 MED ORDER — HYDROMORPHONE HCL PF 1 MG/ML IJ SOLN
INTRAMUSCULAR | Status: AC
  Administered 2014-02-08: 1 mg via INTRAVENOUS
  Filled 2014-02-08: qty 1

## 2014-02-08 MED ORDER — HYDROMORPHONE HCL PF 1 MG/ML IJ SOLN
1.0000 mg | Freq: Once | INTRAMUSCULAR | Status: AC
  Administered 2014-02-08: 1 mg via INTRAVENOUS

## 2014-02-08 MED ORDER — HYDROMORPHONE HCL PF 1 MG/ML IJ SOLN
1.0000 mg | Freq: Once | INTRAMUSCULAR | Status: AC
  Administered 2014-02-08: 1 mg via INTRAVENOUS
  Filled 2014-02-08: qty 1

## 2014-02-08 NOTE — ED Notes (Signed)
Right flank pain, nausea and vomiting. Was here for the same this week per pt. Possible kidney stone.

## 2014-02-08 NOTE — ED Notes (Signed)
PT C/O RT LOWER QUADRANT ABDOMINAL AND RT FLANK PAIN STARTING AT 1830 TONIGHT. STATES THAT HE WAS HERE ON Tuesday FOR SAME THING AND THEY SENT HIM HOME ON PERCOCET WHICH EASED THE PAIN AND PT HAD ACTUALLY BEEN PAIN FREE FOR LAST COUPLE OF DAYS UNTIL TONIGHT AFTER HE GOT BACK FROM [place]. TOOK A ZOFRAN FOR NAUSEA AND A PERCOCET FOR PAIN AND VOMITED BOTH UP. RATES PAIN AS 10/10 NOW. WILL CONTINUE TO MONITOR.

## 2014-02-09 LAB — URINE MICROSCOPIC-ADD ON

## 2014-02-09 LAB — URINALYSIS, ROUTINE W REFLEX MICROSCOPIC
Bilirubin Urine: NEGATIVE
Glucose, UA: NEGATIVE mg/dL
Leukocytes, UA: NEGATIVE
Nitrite: NEGATIVE
Specific Gravity, Urine: 1.015 (ref 1.005–1.030)
Urobilinogen, UA: 0.2 mg/dL (ref 0.0–1.0)
pH: 7.5 (ref 5.0–8.0)

## 2014-02-09 MED ORDER — OXYCODONE-ACETAMINOPHEN 5-325 MG PO TABS: 1.0000 | ORAL_TABLET | Freq: Four times a day (QID) | ORAL | Status: DC | PRN

## 2014-02-09 MED ORDER — KETOROLAC TROMETHAMINE 30 MG/ML IJ SOLN
30.0000 mg | Freq: Once | INTRAMUSCULAR | Status: AC
  Administered 2014-02-09: 30 mg via INTRAVENOUS
  Filled 2014-02-09: qty 1

## 2014-02-09 MED ORDER — KETOROLAC TROMETHAMINE 10 MG PO TABS: 10.0000 mg | ORAL_TABLET | Freq: Three times a day (TID) | ORAL | Status: DC

## 2014-02-09 MED ORDER — TAMSULOSIN HCL 0.4 MG PO CAPS: 0.4000 mg | ORAL_CAPSULE | Freq: Every day | ORAL | Status: DC

## 2014-02-09 MED ORDER — PROMETHAZINE HCL 25 MG/ML IJ SOLN
12.5000 mg | Freq: Once | INTRAMUSCULAR | Status: AC
  Administered 2014-02-09: 12.5 mg via INTRAVENOUS
  Filled 2014-02-09: qty 1

## 2014-02-09 NOTE — ED Notes (Signed)
C/o tenderness at IV site.  Aspirates and flushes easily.  Applied ice pack to site.

## 2014-02-09 NOTE — Discharge Instructions (Signed)
Please add Flomax and Toradol to your current Percocet prescription. Please strain all urine. Please call our licensed urology on Monday for office evaluation of your kidney stones. Please return to the emergency department if pain not responding to the medications. Ureteral Colic Ureteral colic is spasm-like pain from the kidney or the ureter. This is often caused by a kidney stone. The pain is caused by the stone trying to get through the tubes that pass your pee. HOME CARE   Drink enough fluids to keep your pee (urine) clear or pale yellow.  Strain all your pee. A strainer will be provided. Keep anything caught in the strainer and bring it to your doctor. The stone causing the pain may be very small.  Only take medicine as told by your doctor.  Follow up with your doctor as told. GET HELP RIGHT AWAY IF:   Pain is not controlled with medicine.  Pain continues or gets worse.  The pain changes and there is chest or belly (abdominal) pain.  You pass out (faint).  You cannot pee.  You keep throwing up (vomiting).  You have a temperature by mouth above 102 F (38.9 C), not controlled by medicine. MAKE SURE YOU:   Understand these instructions.  Will watch this condition.  Will get help right away if you are not doing well or get worse. Document Released: 05/24/2008 Document Revised: 02/28/2012 Document Reviewed: 05/24/2008 [facility] HospitalExitCare Patient Information 2014 [place] Land[place]ExitCare, [place].

## 2014-02-09 NOTE — ED Provider Notes (Signed)
CSN: [id number]     Arrival date & time 02/08/14  2150 History   First MD Initiated Contact with Patient 02/08/14 2202     Chief Complaint  Patient presents with  . Flank Pain     (Consider location/radiation/quality/duration/timing/severity/associated sxs/prior Treatment) HPI Comments: Patient is a 23 year old male who presents to the emergency department with right flank pain. The patient states that he has a history of kidney stones. He states he was seen here in the emergency department a few days ago, he was given medication was feeling better but today he began to have increasing flank pain. This pain was accompanied by nausea or vomiting. He eventually was unable to keep down his medications, and came to the emergency department for assistance with nausea vomiting and pain. No reported fevers today. He has not seen blood in his urine today, but did see blood in his urine a couple days ago. He has tried Percocet for his pain, but states he cannot keep it down long enough for it to work for him.  Patient is a 23 y.o. male presenting with flank pain. The history is provided by the patient.  Flank Pain This is a recurrent problem. Pertinent negatives include no abdominal pain, arthralgias, chest pain, coughing or neck pain.    Past Medical History  Diagnosis Date  . Kidney stone   . Migraine    Past Surgical History  Procedure Laterality Date  . Foot capsule release w/ percutaneous heel cord lengthening, tibial tendon transfer    . Lymph gland excision     History reviewed. No pertinent family history. History  Substance Use Topics  . Smoking status: Never Smoker   . Smokeless tobacco: Never Used  . Alcohol Use: Yes     Comment: occ    Review of Systems  Constitutional: Negative for activity change.       All ROS Neg except as noted in HPI  HENT: Negative for nosebleeds.   Eyes: Negative for photophobia and discharge.  Respiratory: Negative for cough, shortness of breath  and wheezing.   Cardiovascular: Negative for chest pain and palpitations.  Gastrointestinal: Negative for abdominal pain and blood in stool.  Genitourinary: Positive for flank pain. Negative for dysuria, frequency and hematuria.  Musculoskeletal: Negative for arthralgias, back pain and neck pain.  Skin: Negative.   Neurological: Negative for dizziness, seizures and speech difficulty.  Psychiatric/Behavioral: Negative for hallucinations and confusion.      Allergies  Codeine and Tape  Home Medications   Current Outpatient Rx  Name  Route  Sig  Dispense  Refill  . ondansetron (ZOFRAN) 8 MG tablet   Oral   Take 1 tablet (8 mg total) by mouth every 8 (eight) hours as needed for nausea.   10 tablet   0   . oxyCODONE-acetaminophen (PERCOCET/ROXICET) 5-325 MG per tablet   Oral   Take 1 tablet by mouth every 4 (four) hours as needed for severe pain.   15 tablet   0    BP 154/91  Pulse 76  Temp(Src) 97.6 F (36.4 C) (Oral)  Ht 5\' 9"  (1.753 m)  Wt 210 lb (95.255 kg)  BMI 31.00 kg/m2  SpO2 99% Physical Exam  Nursing note and vitals reviewed. Constitutional: He is oriented to person, place, and time. He appears well-developed and well-nourished.  Non-toxic appearance. He has a sickly appearance. He appears distressed.  HENT:  Head: Normocephalic.  Right Ear: Tympanic membrane and external ear normal.  Left Ear: Tympanic membrane  and external ear normal.  Eyes: EOM and lids are normal. Pupils are equal, round, and reactive to light.  Neck: Normal range of motion. Neck supple. Carotid bruit is not present.  Cardiovascular: Normal rate, regular rhythm, normal heart sounds, intact distal pulses and normal pulses.   Pulmonary/Chest: Breath sounds normal. No respiratory distress.  Abdominal: Soft. Bowel sounds are normal. There is no tenderness. There is no guarding.  Genitourinary:  Right flank pain to movement.  Musculoskeletal: Normal range of motion.  Lymphadenopathy:        Head (right side): No submandibular adenopathy present.       Head (left side): No submandibular adenopathy present.    He has no cervical adenopathy.  Neurological: He is alert and oriented to person, place, and time. He has normal strength. No cranial nerve deficit or sensory deficit.  Skin: Skin is warm and dry.  Psychiatric: He has a normal mood and affect. His speech is normal.    ED Course  Procedures (including critical care time) Labs Review Labs Reviewed  URINALYSIS, ROUTINE W REFLEX MICROSCOPIC - Abnormal; Notable for the following:    Hgb urine dipstick LARGE (*)    Ketones, ur TRACE (*)    Protein, ur TRACE (*)    All other components within normal limits  URINE MICROSCOPIC-ADD ON - Abnormal; Notable for the following:    Bacteria, UA FEW (*)    All other components within normal limits  URINALYSIS, ROUTINE W REFLEX MICROSCOPIC   Imaging Review Dg Abd Acute W/chest  02/08/2014   CLINICAL DATA:  Right flank pain with hematuria, nausea and vomiting.  EXAM: ACUTE ABDOMEN SERIES (ABDOMEN 2 VIEW & CHEST 1 VIEW)  COMPARISON:  DG ABDOMEN 1V dated 03/17/2013  FINDINGS: Cardiomediastinal silhouette is unremarkable. Lungs are clear, no pleural effusions. No pneumothorax. Soft tissue planes and included osseous structures are unremarkable. Thoracic dextroscoliosis.  Bowel gas pattern is nondilated and nonobstructive. Moderate amount of stool projects of the rectal vault, unchanged. At least 6 radiopaque calculi project in the left kidney, largest measuring 8 mm, with probable at least 1-2 new stones. No intra-abdominal mass effect, or free air. Soft tissue planes and included osseous structures are nonsuspicious. Lumbar levoscoliosis.  IMPRESSION: No acute cardiopulmonary process.  At least 6 left renal calculi, at least 1 of which appears new.  Moderate amount of stool projecting at the rectal vault without radiographic findings of bowel obstruction.   Electronically Signed   By: Awilda Metro   On: 02/08/2014 23:47    EKG Interpretation   None       MDM Patient has a history of kidney stones. He began earlier today to have nausea vomiting, unable to keep his pain medication down. He is having increasing right flank area pain, and states this feels like his previous bouts with kidney stones.  Patient was given IV Dilaudid with the first dose not helping, the second dose was given with Toradol and flow max. The patient's pain came down to a 2/10.  Three-way abdomen reveals at least 6 left renal calculi at least one of which appears new. There is moderate stool present, but no bowel obstruction present. Urinalysis reveals a clear yellow specimen with a specific gravity 1.015. A large hemoglobin and trace ketones on the dipstick. There is 0-2 white cells, too numerous to count red cells, only a few bacteria.  The plan is to add Toradol and Flomax to the current Percocet being used. Patient strongly advised to see  the urology specialist next week as sone as possible. Patient is to return to the emergency department if any changes, problems, or deterioration in condition.    Final diagnoses:  None    *I have reviewed nursing notes, vital signs, and all appropriate lab and imaging results for this patient.Kathie Dike**    Krystian Younglove M Pariss Hommes, PA-C 02/09/14 [phone]

## 2014-02-09 NOTE — ED Provider Notes (Signed)
Medical screening examination/treatment/procedure(s) were performed by non-physician practitioner and as supervising physician I was immediately available for consultation/collaboration.     Geoffery Lyonsouglas Tannor Pyon, MD 02/09/14 [phone]

## 2014-02-09 NOTE — ED Notes (Signed)
Patient with no complaints at this time. Respirations even and unlabored. Skin warm/dry. Discharge instructions reviewed with patient at this time. Patient given opportunity to voice concerns/ask questions. Patient discharged at this time and left Emergency Department with steady gait.   

## 2014-03-16 ENCOUNTER — Encounter (HOSPITAL_COMMUNITY): Payer: Self-pay | Admitting: Emergency Medicine

## 2014-03-16 ENCOUNTER — Emergency Department (HOSPITAL_COMMUNITY)
Admission: EM | Admit: 2014-03-16 | Discharge: 2014-03-17 | Disposition: A | Discharge status: Home / Self Care | Payer: BC Managed Care – PPO | Attending: Emergency Medicine | Admitting: Emergency Medicine

## 2014-03-16 DIAGNOSIS — Z79899 Other long term (current) drug therapy: Secondary | ICD-10-CM | POA: Insufficient documentation

## 2014-03-16 DIAGNOSIS — Z791 Long term (current) use of non-steroidal anti-inflammatories (NSAID): Secondary | ICD-10-CM | POA: Insufficient documentation

## 2014-03-16 DIAGNOSIS — R519 Headache, unspecified: Secondary | ICD-10-CM

## 2014-03-16 DIAGNOSIS — R51 Headache: Secondary | ICD-10-CM | POA: Insufficient documentation

## 2014-03-16 DIAGNOSIS — Z8669 Personal history of other diseases of the nervous system and sense organs: Secondary | ICD-10-CM | POA: Insufficient documentation

## 2014-03-16 DIAGNOSIS — Z87442 Personal history of urinary calculi: Secondary | ICD-10-CM | POA: Insufficient documentation

## 2014-03-16 DIAGNOSIS — R112 Nausea with vomiting, unspecified: Secondary | ICD-10-CM | POA: Insufficient documentation

## 2014-03-16 NOTE — ED Notes (Signed)
PT C/O MIGRAINE SINCE 2PM AND VOMITING SINCE 7PM.

## 2014-03-17 MED ORDER — PROCHLORPERAZINE EDISYLATE 5 MG/ML IJ SOLN: 10.0000 mg | Freq: Once | INTRAMUSCULAR | Status: DC

## 2014-03-17 MED ORDER — KETOROLAC TROMETHAMINE 30 MG/ML IJ SOLN
30.0000 mg | Freq: Once | INTRAMUSCULAR | Status: AC
  Administered 2014-03-17: 30 mg via INTRAVENOUS
  Filled 2014-03-17: qty 1

## 2014-03-17 MED ORDER — SODIUM CHLORIDE 0.9 % IV BOLUS (SEPSIS)
1000.0000 mL | Freq: Once | INTRAVENOUS | Status: AC
  Administered 2014-03-17: 1000 mL via INTRAVENOUS

## 2014-03-17 MED ORDER — HYDROMORPHONE HCL PF 1 MG/ML IJ SOLN
1.0000 mg | Freq: Once | INTRAMUSCULAR | Status: AC
  Administered 2014-03-17: 1 mg via INTRAVENOUS
  Filled 2014-03-17: qty 1

## 2014-03-17 MED ORDER — NAPROXEN 500 MG PO TABS: 500.0000 mg | ORAL_TABLET | Freq: Two times a day (BID) | ORAL | Status: DC

## 2014-03-17 MED ORDER — ONDANSETRON HCL 4 MG/2ML IJ SOLN
4.0000 mg | Freq: Once | INTRAMUSCULAR | Status: AC
  Administered 2014-03-17: 4 mg via INTRAVENOUS
  Filled 2014-03-17: qty 2

## 2014-03-17 NOTE — ED Provider Notes (Signed)
CSN: [id number]     Arrival date & time 03/16/14  2219 History  This chart was scribed for Thomas CoKevin M Cyana Shook, MD by Dorothey Basemania Sutton, ED Scribe. This patient was seen in room [facility]09/[facility]09 and the patient's care was started at 12:06 AM.    Chief Complaint  Patient presents with  . Migraine   The history is provided by the patient. No language interpreter was used.   HPI Comments: Thomas MendsRussell K Howell is a 23 y.o. Male with a history of migraines who presents to the Emergency Department complaining of a constant, diffuse headache with associated nausea and multiple episodes of non-bilious, non-bloody emesis onset around 10 hours ago that he states feels similar to his past migraines. He denies any exacerbation with bright light or bright sound. Patient reports taking Tylenol and Percocet at home, but states that he was unable to keep them down, so he has not had any relief of his symptoms. He denies visual disturbance, weakness, diarrhea. Patient has an allergy to codeine. Patient also has a history of nephrolithiasis.   Past Medical History  Diagnosis Date  . Kidney stone   . Migraine    Past Surgical History  Procedure Laterality Date  . Foot capsule release w/ percutaneous heel cord lengthening, tibial tendon transfer    . Lymph gland excision     History reviewed. No pertinent family history. History  Substance Use Topics  . Smoking status: Never Smoker   . Smokeless tobacco: Never Used  . Alcohol Use: Yes     Comment: occ    Review of Systems  A complete 10 system review of systems was obtained and all systems are negative except as noted in the HPI and PMH.    Allergies  Codeine and Tape  Home Medications   Current Outpatient Rx  Name  Route  Sig  Dispense  Refill  . ketorolac (TORADOL) 10 MG tablet   Oral   Take 1 tablet (10 mg total) by mouth 3 (three) times daily.   20 tablet   0   . naproxen (NAPROSYN) 500 MG tablet   Oral   Take 1 tablet (500 mg total) by mouth 2 (two)  times daily with a meal.   15 tablet   0   . ondansetron (ZOFRAN) 8 MG tablet   Oral   Take 1 tablet (8 mg total) by mouth every 8 (eight) hours as needed for nausea.   10 tablet   0   . oxyCODONE-acetaminophen (PERCOCET/ROXICET) 5-325 MG per tablet   Oral   Take 1 tablet by mouth every 4 (four) hours as needed for severe pain.   15 tablet   0   . oxyCODONE-acetaminophen (PERCOCET/ROXICET) 5-325 MG per tablet   Oral   Take 1 tablet by mouth every 6 (six) hours as needed for severe pain.   15 tablet   0   . tamsulosin (FLOMAX) 0.4 MG CAPS capsule   Oral   Take 1 capsule (0.4 mg total) by mouth daily.   20 capsule   0    Triage Vitals: BP 140/87  Pulse 73  Temp(Src) 97.8 F (36.6 C) (Oral)  Resp 20  Wt 210 lb (95.255 kg)  SpO2 97%  Physical Exam  Nursing note and vitals reviewed. Constitutional: He is oriented to person, place, and time. He appears well-developed and well-nourished.  HENT:  Head: Normocephalic and atraumatic.  Eyes: EOM are normal. Pupils are equal, round, and reactive to light.  Neck: Normal range  of motion.  Cardiovascular: Normal rate, regular rhythm, normal heart sounds and intact distal pulses.   Pulmonary/Chest: Effort normal and breath sounds normal. No respiratory distress.  Abdominal: Soft. He exhibits no distension. There is no tenderness.  Musculoskeletal: Normal range of motion.  Neurological: He is alert and oriented to person, place, and time.  5/5 strength in major muscle groups of  bilateral upper and lower extremities. Speech normal. No facial asymetry.   Skin: Skin is warm and dry.  Psychiatric: He has a normal mood and affect. Judgment normal.    ED Course  Procedures (including critical care time)  DIAGNOSTIC STUDIES: Oxygen Saturation is 97% on room air, normal by my interpretation.    COORDINATION OF CARE: 5:06 AM- Will order medication to manage symptoms. Discussed treatment plan with patient at bedside and patient  verbalized agreement.     Labs Review Labs Reviewed - No data to display Imaging Review No results found.   EKG Interpretation None      MDM   Final diagnoses:  Headache    Typical migraine headache for the pt. Non focal neuro exam. No recent head trauma. No fever. Doubt meningitis. Doubt intracranial bleed. Doubt normal pressure hydrocephalus. No indication for imaging. Will treat with migraine cocktail and reevaluate  feels better at time of discharge  I personally performed the services described in this documentation, which was scribed in my presence. The recorded information has been reviewed and is accurate.       Thomas Co, MD 03/17/14 [phone]

## 2014-04-30 ENCOUNTER — Other Ambulatory Visit: Payer: Self-pay | Admitting: *Deleted

## 2014-04-30 MED ORDER — RIZATRIPTAN BENZOATE 10 MG PO TABS: 10.0000 mg | ORAL_TABLET | ORAL | Status: DC | PRN

## 2014-04-30 MED ORDER — NORTRIPTYLINE HCL 25 MG PO CAPS: 25.0000 mg | ORAL_CAPSULE | Freq: Every day | ORAL | Status: DC

## 2014-05-29 ENCOUNTER — Telehealth: Payer: Self-pay | Admitting: Family Medicine

## 2014-05-29 ENCOUNTER — Encounter: Payer: Self-pay | Admitting: Family Medicine

## 2014-05-29 NOTE — Telephone Encounter (Signed)
Patient is requesting a work excuse to cover him for 05/26/2014 and 05/27/2014 for his chronic migraines.

## 2014-05-29 NOTE — Telephone Encounter (Signed)
Patient may have work excuse per Dr. Lorin Picket

## 2014-05-29 NOTE — Telephone Encounter (Signed)
LMOM notifying mother that excuse is ready.

## 2014-05-30 ENCOUNTER — Encounter: Payer: Self-pay | Admitting: Nurse Practitioner

## 2014-05-30 ENCOUNTER — Ambulatory Visit (INDEPENDENT_AMBULATORY_CARE_PROVIDER_SITE_OTHER): Payer: BC Managed Care – PPO | Admitting: Nurse Practitioner

## 2014-05-30 VITALS — BP 118/82 | Ht 69.0 in | Wt 221.0 lb

## 2014-05-30 DIAGNOSIS — G43009 Migraine without aura, not intractable, without status migrainosus: Secondary | ICD-10-CM

## 2014-05-30 DIAGNOSIS — T3995XA Adverse effect of unspecified nonopioid analgesic, antipyretic and antirheumatic, initial encounter: Secondary | ICD-10-CM

## 2014-05-30 DIAGNOSIS — G4440 Drug-induced headache, not elsewhere classified, not intractable: Secondary | ICD-10-CM

## 2014-05-30 MED ORDER — NORTRIPTYLINE HCL 50 MG PO CAPS: 50.0000 mg | ORAL_CAPSULE | Freq: Every day | ORAL | Status: DC

## 2014-05-30 NOTE — Patient Instructions (Signed)
Slowly decrease caffeine intake Slowly wean off all analgesics (except for Naproxen with Maxalt)

## 2014-06-05 ENCOUNTER — Encounter: Payer: Self-pay | Admitting: Nurse Practitioner

## 2014-06-05 DIAGNOSIS — G4440 Drug-induced headache, not elsewhere classified, not intractable: Secondary | ICD-10-CM | POA: Insufficient documentation

## 2014-06-05 DIAGNOSIS — T3995XA Adverse effect of unspecified nonopioid analgesic, antipyretic and antirheumatic, initial encounter: Secondary | ICD-10-CM

## 2014-06-05 NOTE — Progress Notes (Signed)
Subjective:  Presents for recheck on his migraines. Began at age 23. No change in migraine symptomatology. Starts as pressure or throbbing in the temporal area on one side, also pressure behind his eyes. Minimal blurred vision. Photosensitivity. No phonophobia. Nausea and vomiting at times. Now having headaches at least 3-4 days out of the week. Usually lasted several hours. Does not wake him up at nighttime. Some relief with Maxalt and rest. Has a new job working on [employer]. Specific triggers including getting overheated, little [employer] pizza and drinking wine. Is due for an eye exam. Drinks a lot of caffeine. Rare social smoker. Takes daily anti-inflammatories for some form of headache. After being treated for his last kidney stones, was told to stop his Topamax since this can be a factor. Minimal improvement on low-dose nortriptyline 25 mg start on 5/12.  Objective:   BP 118/82  Ht 5\' 9"  (1.753 m)  Wt 221 lb (100.245 kg)  BMI 32.62 kg/m2 NAD. Alert, oriented. Lungs clear. Heart regular rate rhythm.  Assessment: Problem List Items Addressed This Visit     Cardiovascular and Mediastinum   Migraine headache without aura - Primary   Relevant Medications      nortriptyline (PAMELOR) capsule    Other Visit Diagnoses   Analgesic rebound headache        Relevant Medications       nortriptyline (PAMELOR) capsule        Plan: Meds ordered this encounter  Medications  . nortriptyline (PAMELOR) 50 MG capsule    Sig: Take 1 capsule (50 mg total) by mouth at bedtime.    Dispense:  30 capsule    Refill:  2    Order Specific Question:  Supervising Ryleigh Buenger    Answer:  Merlyn AlbertLUKING, WILLIAM S [[id number]]   Increase dose of nortriptyline to 50 mg. Patient to call back in 2 weeks if no improvement in headaches, if no adverse effects, slowly titrate dose of nortriptyline. Maxalt as directed. Slowly wean off all OTC analgesics. Take naproxen as directed with Maxalt. Slowly wean off most of caffeine. Warning  signs reviewed. Return in about 3 months (around 08/30/2014). Keep migraine headache diary and bring to next visit. Call back sooner if needed.

## 2014-06-17 ENCOUNTER — Emergency Department (HOSPITAL_COMMUNITY)
Admission: EM | Admit: 2014-06-17 | Discharge: 2014-06-17 | Disposition: A | Discharge status: Home / Self Care | Payer: BC Managed Care – PPO | Attending: Emergency Medicine | Admitting: Emergency Medicine

## 2014-06-17 ENCOUNTER — Encounter (HOSPITAL_COMMUNITY): Payer: Self-pay | Admitting: Emergency Medicine

## 2014-06-17 ENCOUNTER — Emergency Department (HOSPITAL_COMMUNITY): Payer: BC Managed Care – PPO

## 2014-06-17 DIAGNOSIS — G43909 Migraine, unspecified, not intractable, without status migrainosus: Secondary | ICD-10-CM | POA: Insufficient documentation

## 2014-06-17 DIAGNOSIS — R51 Headache: Secondary | ICD-10-CM | POA: Insufficient documentation

## 2014-06-17 DIAGNOSIS — N201 Calculus of ureter: Secondary | ICD-10-CM | POA: Insufficient documentation

## 2014-06-17 DIAGNOSIS — Z87442 Personal history of urinary calculi: Secondary | ICD-10-CM | POA: Insufficient documentation

## 2014-06-17 DIAGNOSIS — Z79899 Other long term (current) drug therapy: Secondary | ICD-10-CM | POA: Insufficient documentation

## 2014-06-17 DIAGNOSIS — Z791 Long term (current) use of non-steroidal anti-inflammatories (NSAID): Secondary | ICD-10-CM | POA: Insufficient documentation

## 2014-06-17 DIAGNOSIS — R109 Unspecified abdominal pain: Secondary | ICD-10-CM | POA: Diagnosis present

## 2014-06-17 LAB — CBC WITH DIFFERENTIAL/PLATELET
Basophils Absolute: 0 10*3/uL (ref 0.0–0.1)
Basophils Relative: 0 % (ref 0–1)
Eosinophils Absolute: 0.1 10*3/uL (ref 0.0–0.7)
Eosinophils Relative: 1 % (ref 0–5)
HCT: 45.5 % (ref 39.0–52.0)
Hemoglobin: 16.3 g/dL (ref 13.0–17.0)
Lymphocytes Relative: 39 % (ref 12–46)
Lymphs Abs: 2.3 10*3/uL (ref 0.7–4.0)
MCH: 30.5 pg (ref 26.0–34.0)
MCHC: 35.8 g/dL (ref 30.0–36.0)
MCV: 85.2 fL (ref 78.0–100.0)
Monocytes Absolute: 0.5 10*3/uL (ref 0.1–1.0)
Monocytes Relative: 9 % (ref 3–12)
Neutro Abs: 3 10*3/uL (ref 1.7–7.7)
Neutrophils Relative %: 51 % (ref 43–77)
Platelets: 190 10*3/uL (ref 150–400)
RBC: 5.34 MIL/uL (ref 4.22–5.81)
RDW: 11.9 % (ref 11.5–15.5)
WBC: 5.9 10*3/uL (ref 4.0–10.5)

## 2014-06-17 LAB — BASIC METABOLIC PANEL
BUN: 18 mg/dL (ref 6–23)
CO2: 26 mEq/L (ref 19–32)
Calcium: 9.6 mg/dL (ref 8.4–10.5)
Chloride: 100 mEq/L (ref 96–112)
Creatinine, Ser: 1.12 mg/dL (ref 0.50–1.35)
GFR calc Af Amer: >90 mL/min (ref >90)
GFR calc non Af Amer: >90 mL/min (ref >90)
Glucose, Bld: 110 mg/dL — ABNORMAL HIGH (ref 70–99)
Potassium: 4.2 mEq/L (ref 3.7–5.3)
Sodium: 139 mEq/L (ref 137–147)

## 2014-06-17 LAB — URINALYSIS, ROUTINE W REFLEX MICROSCOPIC
Glucose, UA: NEGATIVE mg/dL
Ketones, ur: NEGATIVE mg/dL
Leukocytes, UA: NEGATIVE
Nitrite: NEGATIVE
Specific Gravity, Urine: 1.025 (ref 1.005–1.030)
Urobilinogen, UA: 0.2 mg/dL (ref 0.0–1.0)
pH: 6 (ref 5.0–8.0)

## 2014-06-17 LAB — URINE MICROSCOPIC-ADD ON

## 2014-06-17 MED ORDER — HYDROMORPHONE HCL PF 1 MG/ML IJ SOLN
1.0000 mg | Freq: Once | INTRAMUSCULAR | Status: AC
  Administered 2014-06-17: 1 mg via INTRAVENOUS
  Filled 2014-06-17: qty 1

## 2014-06-17 MED ORDER — ONDANSETRON HCL 4 MG/2ML IJ SOLN
4.0000 mg | Freq: Once | INTRAMUSCULAR | Status: AC
  Administered 2014-06-17: 4 mg via INTRAVENOUS
  Filled 2014-06-17: qty 2

## 2014-06-17 MED ORDER — NAPROXEN 500 MG PO TABS: 500.0000 mg | ORAL_TABLET | Freq: Two times a day (BID) | ORAL | Status: DC

## 2014-06-17 MED ORDER — HYDROCODONE-ACETAMINOPHEN 5-325 MG PO TABS: 1.0000 | ORAL_TABLET | Freq: Four times a day (QID) | ORAL | Status: DC | PRN

## 2014-06-17 MED ORDER — SODIUM CHLORIDE 0.9 % IV BOLUS (SEPSIS)
1000.0000 mL | Freq: Once | INTRAVENOUS | Status: AC
  Administered 2014-06-17: 1000 mL via INTRAVENOUS

## 2014-06-17 MED ORDER — PROMETHAZINE HCL 25 MG PO TABS: 25.0000 mg | ORAL_TABLET | Freq: Four times a day (QID) | ORAL | Status: DC | PRN

## 2014-06-17 MED ORDER — KETOROLAC TROMETHAMINE 30 MG/ML IJ SOLN
30.0000 mg | Freq: Once | INTRAMUSCULAR | Status: AC
  Administered 2014-06-17: 30 mg via INTRAVENOUS
  Filled 2014-06-17: qty 1

## 2014-06-17 MED ORDER — SODIUM CHLORIDE 0.9 % IV SOLN: INTRAVENOUS | Status: DC

## 2014-06-17 NOTE — ED Provider Notes (Signed)
CSN: [id number]     Arrival date & time 06/17/14  7829 History  This chart was scribed for Vanetta Mulders, MD by Ardelia Mems, ED Scribe. This patient was seen in room APA09/APA09 and the patient's care was started at 7:32 AM.  Chief Complaint  Patient presents with  . Flank Pain    Patient is a 23 y.o. male presenting with flank pain. The history is provided by the patient. No language interpreter was used.  Flank Pain This is a recurrent (history of prior kidney stones with similar pain) problem. The current episode started 1 to 2 hours ago. The problem occurs rarely. The problem has not changed since onset.Associated symptoms include abdominal pain and headaches. Pertinent negatives include no chest pain and no shortness of breath. Nothing aggravates the symptoms. Nothing relieves the symptoms. He has tried nothing for the symptoms.    HPI Comments: Thomas Howell is a 23 y.o. Male with a history of kidney stones who presents to the Emergency Department complaining of intermittent, severe left flank pain that radiates to the right side of his abdomen onset this morning, about 1.5 hours ago. He rates his pain at '10/10" currently. He describes his pain as "sharp and aching". He reports associated nausea with episodes of emesis. He also notes that he has been noticing darker than usual urine today. He relates his pain to feeling like prior kidney stones. His most recent kidney stone was in March 2015. He reports that he has always been able to pass kidney stones on his own without surgical intervention. He denies associated fever or any other symptoms. He states that his only medication allergy is to Codeine.    Past Medical History  Diagnosis Date  . Kidney stone   . Migraine    Past Surgical History  Procedure Laterality Date  . Foot capsule release w/ percutaneous heel cord lengthening, tibial tendon transfer    . Lymph gland excision     No family history on file. History   Substance Use Topics  . Smoking status: Never Smoker   . Smokeless tobacco: Never Used  . Alcohol Use: Yes     Comment: occ    Review of Systems  Constitutional: Negative for fever and chills.  HENT: Negative for congestion, rhinorrhea and sore throat.   Eyes: Negative for visual disturbance.  Respiratory: Negative for cough and shortness of breath.   Cardiovascular: Negative for chest pain and leg swelling.  Gastrointestinal: Positive for nausea, vomiting and abdominal pain.  Genitourinary: Positive for hematuria ("dark urine") and flank pain. Negative for dysuria.  Musculoskeletal: Positive for back pain.  Skin: Negative for rash.  Neurological: Positive for headaches.  Hematological: Does not bruise/bleed easily.  Psychiatric/Behavioral: Negative for confusion.    Allergies  Codeine and Tape  Home Medications   Prior to Admission medications   Medication Sig Start Date End Date Taking? Authorizing Provider  naproxen sodium (ANAPROX) 220 MG tablet Take 440 mg by mouth daily as needed (migraines).   Yes Historical Provider, MD  nortriptyline (PAMELOR) 50 MG capsule Take 1 capsule (50 mg total) by mouth at bedtime. 05/30/14  Yes Campbell Riches, NP  rizatriptan (MAXALT) 10 MG tablet Take 1 tablet (10 mg total) by mouth as needed for migraine. May repeat in 2 hours if needed 04/30/14  Yes Merlyn Albert, MD  HYDROcodone-acetaminophen (NORCO/VICODIN) 5-325 MG per tablet Take 1-2 tablets by mouth every 6 (six) hours as needed. 06/17/14   Vanetta Mulders, MD  naproxen (NAPROSYN) 500 MG tablet Take 1 tablet (500 mg total) by mouth 2 (two) times daily. 06/17/14   Vanetta MuldersScott Fatima Fedie, MD  promethazine (PHENERGAN) 25 MG tablet Take 1 tablet (25 mg total) by mouth every 6 (six) hours as needed. 06/17/14   Vanetta MuldersScott Lemoyne Scarpati, MD   Triage Vitals: BP 139/89  Pulse 70  Temp(Src) 98.1 F (36.7 C) (Oral)  Resp 18  Ht 5\' 9"  (1.753 m)  Wt 215 lb (97.523 kg)  BMI 31.74 kg/m2  SpO2  100%  Physical Exam  Nursing note and vitals reviewed. Constitutional: He is oriented to person, place, and time. He appears well-developed and well-nourished. No distress.  HENT:  Head: Normocephalic and atraumatic.  Eyes: Conjunctivae and EOM are normal.  Neck: Neck supple. No tracheal deviation present.  Cardiovascular: Normal rate and regular rhythm.   Pulmonary/Chest: Effort normal and breath sounds normal. No respiratory distress. He has no wheezes. He has no rales.  Lungs CTA bilaterally  Abdominal: Soft. Bowel sounds are normal. There is no tenderness.  Genitourinary:  No CVA tenderness  Musculoskeletal: Normal range of motion. He exhibits no edema.  No swelling in ankles  Neurological: He is alert and oriented to person, place, and time.  Skin: Skin is warm and dry.  Psychiatric: He has a normal mood and affect. His behavior is normal.    ED Course  Procedures (including critical care time)  DIAGNOSTIC STUDIES: Oxygen Saturation is 100% on RA, normal by my interpretation.    COORDINATION OF CARE: 7:36 AM- Discussed pan to obtain an abdomen-pelvis CT and diagnostic lab work. Will also order medications. Pt advised of plan for treatment and pt agrees.  Medications  0.9 %  sodium chloride infusion (not administered)  sodium chloride 0.9 % bolus 1,000 mL (0 mLs Intravenous Stopped 06/17/14 0927)  ondansetron (ZOFRAN) injection 4 mg (4 mg Intravenous Given 06/17/14 0805)  HYDROmorphone (DILAUDID) injection 1 mg (1 mg Intravenous Given 06/17/14 0805)  ondansetron (ZOFRAN) injection 4 mg (4 mg Intravenous Given 06/17/14 0924)  ketorolac (TORADOL) 30 MG/ML injection 30 mg (30 mg Intravenous Given 06/17/14 0924)    Results for orders placed during the hospital encounter of 06/17/14  URINALYSIS, ROUTINE W REFLEX MICROSCOPIC      Result Value Ref Range   Color, Urine YELLOW  YELLOW   APPearance HAZY (*) CLEAR   Specific Gravity, Urine 1.025  1.005 - 1.030   pH 6.0  5.0 - 8.0    Glucose, UA NEGATIVE  NEGATIVE mg/dL   Hgb urine dipstick LARGE (*) NEGATIVE   Bilirubin Urine SMALL (*) NEGATIVE   Ketones, ur NEGATIVE  NEGATIVE mg/dL   Protein, ur TRACE (*) NEGATIVE mg/dL   Urobilinogen, UA 0.2  0.0 - 1.0 mg/dL   Nitrite NEGATIVE  NEGATIVE   Leukocytes, UA NEGATIVE  NEGATIVE  URINE MICROSCOPIC-ADD ON      Result Value Ref Range   RBC / HPF TOO NUMEROUS TO COUNT  <3 RBC/hpf   Bacteria, UA MANY (*) RARE  CBC WITH DIFFERENTIAL      Result Value Ref Range   WBC 5.9  4.0 - 10.5 K/uL   RBC 5.34  4.22 - 5.81 MIL/uL   Hemoglobin 16.3  13.0 - 17.0 g/dL   HCT 78.245.5  95.639.0 - 21.352.0 %   MCV 85.2  78.0 - 100.0 fL   MCH 30.5  26.0 - 34.0 pg   MCHC 35.8  30.0 - 36.0 g/dL   RDW 08.611.9  57.811.5 - 46.915.5 %  Platelets 190  150 - 400 K/uL   Neutrophils Relative % 51  43 - 77 %   Neutro Abs 3.0  1.7 - 7.7 K/uL   Lymphocytes Relative 39  12 - 46 %   Lymphs Abs 2.3  0.7 - 4.0 K/uL   Monocytes Relative 9  3 - 12 %   Monocytes Absolute 0.5  0.1 - 1.0 K/uL   Eosinophils Relative 1  0 - 5 %   Eosinophils Absolute 0.1  0.0 - 0.7 K/uL   Basophils Relative 0  0 - 1 %   Basophils Absolute 0.0  0.0 - 0.1 K/uL  BASIC METABOLIC PANEL      Result Value Ref Range   Sodium 139  137 - 147 mEq/L   Potassium 4.2  3.7 - 5.3 mEq/L   Chloride 100  96 - 112 mEq/L   CO2 26  19 - 32 mEq/L   Glucose, Bld 110 (*) 70 - 99 mg/dL   BUN 18  6 - 23 mg/dL   Creatinine, Ser 9.141.12  0.50 - 1.35 mg/dL   Calcium 9.6  8.4 - 78.210.5 mg/dL   GFR calc non Af Amer >90  >90 mL/min   GFR calc Af Amer >90  >90 mL/min   Ct Abdomen Pelvis Wo Contrast  06/17/2014   CLINICAL DATA:  Left-sided flank pain.  Urolithiasis.  EXAM: CT ABDOMEN AND PELVIS WITHOUT CONTRAST  TECHNIQUE: Multidetector CT imaging of the abdomen and pelvis was performed following the standard protocol without IV contrast.  COMPARISON:  09/13/2011  FINDINGS: A 4 mm proximal left ureteral calculus is seen just above the level of the iliac crest. This causes mild  left hydronephrosis. Multiple small less than 1 cm intrarenal calculi are noted bilaterally, left side greater than right. No evidence of right-sided hydronephrosis or ureteral calculi.  The other abdominal parenchymal organs are unremarkable in appearance on this noncontrast study. No soft tissue masses or lymphadenopathy identified. No evidence of inflammatory process or abnormal fluid collections. No evidence of bowel wall thickening or dilatation. Small bowel is located in the right abdomen with the colon and cecum in the left abdomen and pelvis, consistent with congenital malrotation of bowel.  IMPRESSION: 4 mm proximal left ureteral calculus causing mild left hydronephrosis.  Bilateral nonobstructive nephrolithiasis.  Incidentally noted congenital malrotation of bowel. No evidence of bowel obstruction.   Electronically Signed   By: Myles RosenthalJohn  Stahl M.D.   On: 06/17/2014 08:59     EKG Interpretation None      MDM   Final diagnoses:  Left ureteral stone   CT scan now shows left renal calculus proximal 4 mm in size. That's patient's clinical scenario and urinalysis showing hematuria. Patient has had left flank pain. Patient improved here with pain medicine and antinausea medicine. Will continue Naprosyn hydrocodone and Phenergan at home. Followup with primary care Dr. Work note provided. Patient appears to have no complicating factors at this point in time.  I personally performed the services described in this documentation, which was scribed in my presence. The recorded information has been reviewed and is accurate.    Vanetta MuldersScott Randie Tallarico, MD 06/17/14 1017

## 2014-06-17 NOTE — Discharge Instructions (Signed)
Take the Naprosyn on a regular basis. Take the Phenergan at least for today. Supplement with hydrocodone as needed for pain. Followup with your regular Dr. in the next few days. Would expect you to pass the stone sometime in the next 2 days. Return for any newer worse symptoms like fever or persistent vomiting worse pain. Work note provided to be off the next 3 days.

## 2014-06-17 NOTE — ED Notes (Signed)
Pt states he is not driving, aunt is coming to pick him up.

## 2014-06-17 NOTE — ED Notes (Signed)
Pt reports left sided flank pain that started this morning. Pt has hx of stones.

## 2014-06-17 NOTE — ED Notes (Signed)
Pt given strainer at d/c.

## 2014-06-21 ENCOUNTER — Emergency Department (HOSPITAL_COMMUNITY): Payer: BC Managed Care – PPO

## 2014-06-21 ENCOUNTER — Encounter (HOSPITAL_COMMUNITY): Payer: Self-pay | Admitting: Emergency Medicine

## 2014-06-21 ENCOUNTER — Emergency Department (HOSPITAL_COMMUNITY)
Admission: EM | Admit: 2014-06-21 | Discharge: 2014-06-22 | Disposition: A | Discharge status: Home / Self Care | Payer: BC Managed Care – PPO | Attending: Emergency Medicine | Admitting: Emergency Medicine

## 2014-06-21 DIAGNOSIS — G43909 Migraine, unspecified, not intractable, without status migrainosus: Secondary | ICD-10-CM

## 2014-06-21 DIAGNOSIS — G43009 Migraine without aura, not intractable, without status migrainosus: Secondary | ICD-10-CM | POA: Diagnosis not present

## 2014-06-21 DIAGNOSIS — Z79899 Other long term (current) drug therapy: Secondary | ICD-10-CM | POA: Insufficient documentation

## 2014-06-21 DIAGNOSIS — N200 Calculus of kidney: Secondary | ICD-10-CM

## 2014-06-21 DIAGNOSIS — N23 Unspecified renal colic: Secondary | ICD-10-CM | POA: Diagnosis not present

## 2014-06-21 LAB — BASIC METABOLIC PANEL
Anion gap: 14 (ref 5–15)
BUN: 24 mg/dL — ABNORMAL HIGH (ref 6–23)
CO2: 26 mEq/L (ref 19–32)
Calcium: 9.8 mg/dL (ref 8.4–10.5)
Chloride: 99 mEq/L (ref 96–112)
Creatinine, Ser: 1.63 mg/dL — ABNORMAL HIGH (ref 0.50–1.35)
GFR calc Af Amer: 68 mL/min — ABNORMAL LOW (ref >90)
GFR calc non Af Amer: 58 mL/min — ABNORMAL LOW (ref >90)
Glucose, Bld: 95 mg/dL (ref 70–99)
Potassium: 4.1 mEq/L (ref 3.7–5.3)
Sodium: 139 mEq/L (ref 137–147)

## 2014-06-21 LAB — URINE MICROSCOPIC-ADD ON

## 2014-06-21 LAB — URINALYSIS, ROUTINE W REFLEX MICROSCOPIC
Bilirubin Urine: NEGATIVE
Glucose, UA: NEGATIVE mg/dL
Ketones, ur: 40 mg/dL — AB
Leukocytes, UA: NEGATIVE
Nitrite: NEGATIVE
Protein, ur: NEGATIVE mg/dL
Specific Gravity, Urine: 1.02 (ref 1.005–1.030)
Urobilinogen, UA: 0.2 mg/dL (ref 0.0–1.0)
pH: 6.5 (ref 5.0–8.0)

## 2014-06-21 MED ORDER — HYDROMORPHONE HCL PF 1 MG/ML IJ SOLN
1.0000 mg | Freq: Once | INTRAMUSCULAR | Status: AC
  Administered 2014-06-21: 1 mg via INTRAVENOUS
  Filled 2014-06-21: qty 1

## 2014-06-21 MED ORDER — ONDANSETRON HCL 4 MG/2ML IJ SOLN
4.0000 mg | Freq: Once | INTRAMUSCULAR | Status: AC
  Administered 2014-06-21: 4 mg via INTRAVENOUS
  Filled 2014-06-21: qty 2

## 2014-06-21 MED ORDER — SODIUM CHLORIDE 0.9 % IV BOLUS (SEPSIS)
1000.0000 mL | Freq: Once | INTRAVENOUS | Status: AC
  Administered 2014-06-21: 1000 mL via INTRAVENOUS

## 2014-06-21 MED ORDER — KETOROLAC TROMETHAMINE 30 MG/ML IJ SOLN
30.0000 mg | Freq: Once | INTRAMUSCULAR | Status: AC
  Administered 2014-06-21: 30 mg via INTRAVENOUS
  Filled 2014-06-21: qty 1

## 2014-06-21 NOTE — ED Notes (Signed)
Kidney stone symptoms started Monday morning, was seen and treated. Medication not relieving pain. Now causing vomiting and migraine  headache.

## 2014-06-21 NOTE — ED Provider Notes (Signed)
CSN: [id number]     Arrival date & time 06/21/14  2150 History   First MD Initiated Contact with Patient 06/21/14 2205     Chief Complaint  Patient presents with  . Nephrolithiasis     (Consider location/radiation/quality/duration/timing/severity/associated sxs/prior Treatment) The history is provided by the patient and a parent.    Thomas Howell is a 23 y.o. male presenting with continued left flank pain which has been intermittent since he was seen here 5 days ago and diagnosed with a 4 mm left ureteral stone.  Around 5 pm tonight he developed worsened pain, but also developed an acute migraine headache which is consistent with prior migraines,  Describing right sided pain behind his eye along with nausea and vomiting.  He has had 10+ episodes of vomiting since the migraine started.  He has been unable to keep down his phenergan or hydrocodone.  The patients symptoms were not preceded by prodromal symptoms.   There has been no fevers, chills, syncope, confusion or focal weakness.       Past Medical History  Diagnosis Date  . Kidney stone   . Migraine    Past Surgical History  Procedure Laterality Date  . Foot capsule release w/ percutaneous heel cord lengthening, tibial tendon transfer    . Lymph gland excision     History reviewed. No pertinent family history. History  Substance Use Topics  . Smoking status: Never Smoker   . Smokeless tobacco: Never Used  . Alcohol Use: Yes     Comment: occ    Review of Systems  Constitutional: Negative for fever and chills.  HENT: Negative for congestion and sore throat.   Eyes: Negative.   Respiratory: Negative for chest tightness and shortness of breath.   Cardiovascular: Negative for chest pain.  Gastrointestinal: Negative for nausea and abdominal pain.  Genitourinary: Negative.   Musculoskeletal: Negative for arthralgias, joint swelling and neck pain.  Skin: Negative.  Negative for rash and wound.  Neurological: Positive for  headaches. Negative for dizziness, weakness, light-headedness and numbness.  Psychiatric/Behavioral: Negative.       Allergies  Codeine and Tape  Home Medications   Prior to Admission medications   Medication Sig Start Date End Date Taking? Authorizing Provider  HYDROcodone-acetaminophen (NORCO/VICODIN) 5-325 MG per tablet Take 1-2 tablets by mouth every 6 (six) hours as needed. 06/17/14   Vanetta MuldersScott Zackowski, MD  naproxen (NAPROSYN) 500 MG tablet Take 1 tablet (500 mg total) by mouth 2 (two) times daily. 06/17/14   Vanetta MuldersScott Zackowski, MD  naproxen sodium (ANAPROX) 220 MG tablet Take 440 mg by mouth daily as needed (migraines).    Historical Provider, MD  nortriptyline (PAMELOR) 50 MG capsule Take 1 capsule (50 mg total) by mouth at bedtime. 05/30/14   Campbell Richesarolyn C Hoskins, NP  oxyCODONE-acetaminophen (PERCOCET/ROXICET) 5-325 MG per tablet Take 1 tablet by mouth every 4 (four) hours as needed. 06/22/14   Burgess AmorJulie Callista Hoh, PA-C  promethazine (PHENERGAN) 25 MG tablet Take 1 tablet (25 mg total) by mouth every 6 (six) hours as needed. 06/17/14   Vanetta MuldersScott Zackowski, MD  rizatriptan (MAXALT) 10 MG tablet Take 1 tablet (10 mg total) by mouth as needed for migraine. May repeat in 2 hours if needed 04/30/14   Merlyn AlbertWilliam S Luking, MD  tamsulosin (FLOMAX) 0.4 MG CAPS capsule Take 1 capsule (0.4 mg total) by mouth daily after supper. 06/22/14   Burgess AmorJulie Kamille Toomey, PA-C   BP 133/79  Pulse 80  Temp(Src) 98.2 F (36.8 C) (Oral)  Resp  16  Ht 5\' 9"  (1.753 m)  Wt 215 lb (97.523 kg)  BMI 31.74 kg/m2  SpO2 94% Physical Exam  Nursing note and vitals reviewed. Constitutional: He is oriented to person, place, and time. He appears well-developed and well-nourished.  Uncomfortable appearing  HENT:  Head: Normocephalic and atraumatic.  Mouth/Throat: Oropharynx is clear and moist.  Eyes: Conjunctivae and EOM are normal. Pupils are equal, round, and reactive to light.  Neck: Normal range of motion. Neck supple.  Cardiovascular: Normal rate,  regular rhythm, normal heart sounds and intact distal pulses.   Pulmonary/Chest: Effort normal and breath sounds normal. He has no wheezes.  Abdominal: Soft. Bowel sounds are normal. He exhibits no distension. There is no tenderness.  Musculoskeletal: Normal range of motion.  Lymphadenopathy:    He has no cervical adenopathy.  Neurological: He is alert and oriented to person, place, and time. He has normal strength. No sensory deficit. Gait normal. GCS eye subscore is 4. GCS verbal subscore is 5. GCS motor subscore is 6.   normal rapid alternating movements. Cranial nerves III-XII intact.  No pronator drift.  Skin: Skin is warm and dry. No rash noted.  Psychiatric: He has a normal mood and affect. His speech is normal and behavior is normal. Thought content normal. Cognition and memory are normal.    ED Course  Procedures (including critical care time) Labs Review Labs Reviewed  URINALYSIS, ROUTINE W REFLEX MICROSCOPIC - Abnormal; Notable for the following:    Hgb urine dipstick LARGE (*)    Ketones, ur 40 (*)    All other components within normal limits  BASIC METABOLIC PANEL - Abnormal; Notable for the following:    BUN 24 (*)    Creatinine, Ser 1.63 (*)    GFR calc non Af Amer 58 (*)    GFR calc Af Amer 68 (*)    All other components within normal limits  URINE MICROSCOPIC-ADD ON    Imaging Review Dg Abd 1 View  06/21/2014   CLINICAL DATA:  Kidney stones.  EXAM: ABDOMEN - 1 VIEW  COMPARISON:  02/08/2014  FINDINGS: There are 2 calculi overlapping the left flank, at the level of the L3-4 disc space, measuring 4 and 5 mm individually. Only 1 stone was seen in this location on 06/17/2014 CT. Multiple other left-sided urinary calculi, overlapping the renal shadow, measuring up to 11 mm. No right-sided nephrolithiasis visible by radiography.  The bowel gas pattern is nonobstructive. There is lumbar levoscoliosis incidentally noted.  IMPRESSION: 1. Two urinary calculi, 5 mm and 4 mm,  overlaps the mid left ureter. 2. Extensive left nephrolithiasis.   Electronically Signed   By: Tiburcio PeaJonathan  Watts M.D.   On: 06/21/2014 23:51     EKG Interpretation None      MDM   Final diagnoses:  Ureteral colic  Kidney stone  Migraine without status migrainosus, not intractable, unspecified migraine type    Pt was given IV fluids, 2 liters total, dilaudid, toradol and zofran IV.  Temporary improvement in pain (both headache and flank) until he had to move for imaging. Added decadron and phenergan with no improvement in sx.  Compazine 10 mg, benadryl 12.5 mg with improved headache. Flank pain resolved.  Oral trial of fluids which he tolerated.  flomax also given. He has f/u appt with [facility] in 6 days. Encouraged to keep this appt. In the interim,  Advised return here for any worsened pain, fever, uncontrolled vomiting.  Patients labs and/or radiological studies were viewed  and considered during the medical decision making and disposition process. Discussed patient with Dr. Jeraldine Loots prior to disposition.      Burgess Amor, PA-C 06/22/14 [phone]

## 2014-06-22 MED ORDER — PROCHLORPERAZINE EDISYLATE 5 MG/ML IJ SOLN
10.0000 mg | Freq: Once | INTRAMUSCULAR | Status: AC
  Administered 2014-06-22: 10 mg via INTRAVENOUS
  Filled 2014-06-22: qty 2

## 2014-06-22 MED ORDER — PROMETHAZINE HCL 25 MG/ML IJ SOLN
12.5000 mg | Freq: Once | INTRAMUSCULAR | Status: AC
  Administered 2014-06-22: 12.5 mg via INTRAVENOUS
  Filled 2014-06-22: qty 1

## 2014-06-22 MED ORDER — SODIUM CHLORIDE 0.9 % IV BOLUS (SEPSIS)
1000.0000 mL | Freq: Once | INTRAVENOUS | Status: AC
  Administered 2014-06-22: 1000 mL via INTRAVENOUS

## 2014-06-22 MED ORDER — TAMSULOSIN HCL 0.4 MG PO CAPS
0.4000 mg | ORAL_CAPSULE | Freq: Once | ORAL | Status: AC
  Administered 2014-06-22: 0.4 mg via ORAL
  Filled 2014-06-22: qty 1

## 2014-06-22 MED ORDER — OXYCODONE-ACETAMINOPHEN 5-325 MG PO TABS: 1.0000 | ORAL_TABLET | ORAL | Status: DC | PRN

## 2014-06-22 MED ORDER — DIPHENHYDRAMINE HCL 50 MG/ML IJ SOLN
12.5000 mg | Freq: Once | INTRAMUSCULAR | Status: AC
  Administered 2014-06-22: 12.5 mg via INTRAVENOUS
  Filled 2014-06-22: qty 1

## 2014-06-22 MED ORDER — PROMETHAZINE HCL 25 MG PO TABS: 25.0000 mg | ORAL_TABLET | Freq: Four times a day (QID) | ORAL | Status: DC | PRN

## 2014-06-22 MED ORDER — DEXAMETHASONE SODIUM PHOSPHATE 10 MG/ML IJ SOLN
10.0000 mg | Freq: Once | INTRAMUSCULAR | Status: AC
  Administered 2014-06-22: 10 mg via INTRAVENOUS
  Filled 2014-06-22: qty 1

## 2014-06-22 MED ORDER — TAMSULOSIN HCL 0.4 MG PO CAPS: 0.4000 mg | ORAL_CAPSULE | Freq: Every day | ORAL | Status: DC

## 2014-06-22 MED ORDER — TAMSULOSIN HCL 0.4 MG PO CAPS
ORAL_CAPSULE | ORAL | Status: AC
  Filled 2014-06-22: qty 1

## 2014-06-22 NOTE — ED Provider Notes (Signed)
  Medical screening examination/treatment/procedure(s) were performed by non-physician practitioner and as supervising physician I was immediately available for consultation/collaboration.   EKG Interpretation None         Zedrick Springsteen, MD 06/22/14 1558 

## 2014-06-22 NOTE — Discharge Instructions (Signed)
Kidney Stones °Kidney stones (urolithiasis) are deposits that form inside your kidneys. The intense pain is caused by the stone moving through the urinary tract. When the stone moves, the ureter goes into spasm around the stone. The stone is usually passed in the urine.  °CAUSES  °· A disorder that makes certain neck glands produce too much parathyroid hormone (primary hyperparathyroidism). °· A buildup of uric acid crystals, similar to gout in your joints. °· Narrowing (stricture) of the ureter. °· A kidney obstruction present at birth (congenital obstruction). °· Previous surgery on the kidney or ureters. °· Numerous kidney infections. °SYMPTOMS  °· Feeling sick to your stomach (nauseous). °· Throwing up (vomiting). °· Blood in the urine (hematuria). °· Pain that usually spreads (radiates) to the groin. °· Frequency or urgency of urination. °DIAGNOSIS  °· Taking a history and physical exam. °· Blood or urine tests. °· CT scan. °· Occasionally, an examination of the inside of the urinary bladder (cystoscopy) is performed. °TREATMENT  °· Observation. °· Increasing your fluid intake. °· Extracorporeal shock wave lithotripsy--This is a noninvasive procedure that uses shock waves to break up kidney stones. °· Surgery may be needed if you have severe pain or persistent obstruction. There are various surgical procedures. Most of the procedures are performed with the use of small instruments. Only small incisions are needed to accommodate these instruments, so recovery time is minimized. °The size, location, and chemical composition are all important variables that will determine the proper choice of action for you. Talk to your health care provider to better understand your situation so that you will minimize the risk of injury to yourself and your kidney.  °HOME CARE INSTRUCTIONS  °· Drink enough water and fluids to keep your urine clear or pale yellow. This will help you to pass the stone or stone fragments. °· Strain  all urine through the provided strainer. Keep all particulate matter and stones for your health care provider to see. The stone causing the pain may be as small as a grain of salt. It is very important to use the strainer each and every time you pass your urine. The collection of your stone will allow your health care provider to analyze it and verify that a stone has actually passed. The stone analysis will often identify what you can do to reduce the incidence of recurrences. °· Only take over-the-counter or prescription medicines for pain, discomfort, or fever as directed by your health care provider. °· Make a follow-up appointment with your health care provider as directed. °· Get follow-up X-rays if required. The absence of pain does not always mean that the stone has passed. It may have only stopped moving. If the urine remains completely obstructed, it can cause loss of kidney function or even complete destruction of the kidney. It is your responsibility to make sure X-rays and follow-ups are completed. Ultrasounds of the kidney can show blockages and the status of the kidney. Ultrasounds are not associated with any radiation and can be performed easily in a matter of minutes. °SEEK MEDICAL CARE IF: °· You experience pain that is progressive and unresponsive to any pain medicine you have been prescribed. °SEEK IMMEDIATE MEDICAL CARE IF:  °· Pain cannot be controlled with the prescribed medicine. °· You have a fever or shaking chills. °· The severity or intensity of pain increases over 18 hours and is not relieved by pain medicine. °· You develop a new onset of abdominal pain. °· You feel faint or pass out. °·   You are unable to urinate. MAKE SURE YOU:   Understand these instructions.  Will watch your condition.  Will get help right away if you are not doing well or get worse. Document Released: 12/06/2005 Document Revised: 08/08/2013 Document Reviewed: 05/09/2013 [facility] Patient Information 2015  [place], [place]. This information is not intended to replace advice given to you by your health care provider. Make sure you discuss any questions you have with your health care provider.  Migraine Headache A migraine headache is very bad, throbbing pain on one or both sides of your head. Talk to your doctor about what things may bring on (trigger) your migraine headaches. HOME CARE  Only take medicines as told by your doctor.  Lie down in a dark, quiet room when you have a migraine.  Keep a journal to find out if certain things bring on migraine headaches. For example, write down:  What you eat and drink.  How much sleep you get.  Any change to your diet or medicines.  Lessen how much alcohol you drink.  Quit smoking if you smoke.  Get enough sleep.  Lessen any stress in your life.  Keep lights dim if bright lights bother you or make your migraines worse. GET HELP RIGHT AWAY IF:   Your migraine becomes really bad.  You have a fever.  You have a stiff neck.  You have trouble seeing.  Your muscles are weak, or you lose muscle control.  You lose your balance or have trouble walking.  You feel like you will pass out (faint), or you pass out.  You have really bad symptoms that are different than your first symptoms. MAKE SURE YOU:   Understand these instructions.  Will watch your condition.  Will get help right away if you are not doing well or get worse. Document Released: 09/14/2008 Document Revised: 02/28/2012 Document Reviewed: 08/13/2013 [facility] Patient Information 2015 [place], [place]. This information is not intended to replace advice given to you by your health care provider. Make sure you discuss any questions you have with your health care provider.

## 2014-06-24 ENCOUNTER — Ambulatory Visit (HOSPITAL_COMMUNITY): Payer: BC Managed Care – PPO | Admitting: Anesthesiology

## 2014-06-24 ENCOUNTER — Other Ambulatory Visit: Payer: Self-pay | Admitting: Urology

## 2014-06-24 ENCOUNTER — Encounter (HOSPITAL_COMMUNITY): Payer: BC Managed Care – PPO | Admitting: Anesthesiology

## 2014-06-24 ENCOUNTER — Encounter (HOSPITAL_COMMUNITY)
Admission: RE | Disposition: A | Discharge status: Home / Self Care | Payer: Self-pay | Source: Ambulatory Visit | Attending: Urology

## 2014-06-24 ENCOUNTER — Ambulatory Visit (HOSPITAL_COMMUNITY)
Admission: RE | Admit: 2014-06-24 | Discharge: 2014-06-24 | Disposition: A | Discharge status: Home / Self Care | Payer: BC Managed Care – PPO | Source: Ambulatory Visit | Attending: Urology | Admitting: Urology

## 2014-06-24 ENCOUNTER — Encounter (HOSPITAL_COMMUNITY): Payer: Self-pay | Admitting: *Deleted

## 2014-06-24 DIAGNOSIS — N200 Calculus of kidney: Secondary | ICD-10-CM

## 2014-06-24 DIAGNOSIS — Z885 Allergy status to narcotic agent status: Secondary | ICD-10-CM | POA: Insufficient documentation

## 2014-06-24 DIAGNOSIS — Z6833 Body mass index (BMI) 33.0-33.9, adult: Secondary | ICD-10-CM | POA: Diagnosis not present

## 2014-06-24 DIAGNOSIS — N201 Calculus of ureter: Secondary | ICD-10-CM | POA: Diagnosis not present

## 2014-06-24 HISTORY — DX: Other complications of anesthesia, initial encounter: T88.59XA

## 2014-06-24 HISTORY — DX: Other specified postprocedural states: R11.2

## 2014-06-24 HISTORY — DX: Adverse effect of unspecified anesthetic, initial encounter: T41.45XA

## 2014-06-24 HISTORY — PX: CYSTOSCOPY WITH RETROGRADE PYELOGRAM, URETEROSCOPY AND STENT PLACEMENT: SHX5789

## 2014-06-24 HISTORY — DX: Other specified postprocedural states: Z98.890

## 2014-06-24 SURGERY — CYSTOURETEROSCOPY, WITH RETROGRADE PYELOGRAM AND STENT INSERTION
Anesthesia: General | Laterality: Left

## 2014-06-24 MED ORDER — HYDROMORPHONE HCL PF 1 MG/ML IJ SOLN
INTRAMUSCULAR | Status: AC
  Filled 2014-06-24: qty 1

## 2014-06-24 MED ORDER — MIDAZOLAM HCL 5 MG/5ML IJ SOLN
INTRAMUSCULAR | Status: DC | PRN
  Administered 2014-06-24: 2 mg via INTRAVENOUS

## 2014-06-24 MED ORDER — LIDOCAINE HCL (CARDIAC) 20 MG/ML IV SOLN
INTRAVENOUS | Status: DC | PRN
  Administered 2014-06-24: 100 mg via INTRAVENOUS

## 2014-06-24 MED ORDER — KETOROLAC TROMETHAMINE 30 MG/ML IJ SOLN
INTRAMUSCULAR | Status: AC
  Filled 2014-06-24: qty 1

## 2014-06-24 MED ORDER — SUCCINYLCHOLINE CHLORIDE 20 MG/ML IJ SOLN
INTRAMUSCULAR | Status: DC | PRN
  Administered 2014-06-24: 100 mg via INTRAVENOUS

## 2014-06-24 MED ORDER — HYOSCYAMINE SULFATE 0.125 MG PO TABS: 0.1250 mg | ORAL_TABLET | ORAL | Status: DC | PRN

## 2014-06-24 MED ORDER — OXYCODONE-ACETAMINOPHEN 5-325 MG PO TABS
ORAL_TABLET | ORAL | Status: AC
  Filled 2014-06-24: qty 1

## 2014-06-24 MED ORDER — FENTANYL CITRATE 0.05 MG/ML IJ SOLN
INTRAMUSCULAR | Status: DC | PRN
  Administered 2014-06-24 (×4): 50 ug via INTRAVENOUS

## 2014-06-24 MED ORDER — METOCLOPRAMIDE HCL 5 MG/ML IJ SOLN
INTRAMUSCULAR | Status: AC
  Filled 2014-06-24: qty 2

## 2014-06-24 MED ORDER — OXYCODONE-ACETAMINOPHEN 5-325 MG PO TABS: 1.0000 | ORAL_TABLET | ORAL | Status: DC | PRN

## 2014-06-24 MED ORDER — LIDOCAINE HCL 2 % EX GEL
CUTANEOUS | Status: DC | PRN
  Administered 2014-06-24: 1 via URETHRAL

## 2014-06-24 MED ORDER — FENTANYL CITRATE 0.05 MG/ML IJ SOLN
INTRAMUSCULAR | Status: AC
  Filled 2014-06-24: qty 2

## 2014-06-24 MED ORDER — PROMETHAZINE HCL 25 MG/ML IJ SOLN: 6.2500 mg | INTRAMUSCULAR | Status: DC | PRN

## 2014-06-24 MED ORDER — ESMOLOL HCL 10 MG/ML IV SOLN
INTRAVENOUS | Status: DC | PRN
  Administered 2014-06-24: 20 mg via INTRAVENOUS

## 2014-06-24 MED ORDER — TAMSULOSIN HCL 0.4 MG PO CAPS: 0.4000 mg | ORAL_CAPSULE | Freq: Every day | ORAL | Status: DC

## 2014-06-24 MED ORDER — ONDANSETRON HCL 4 MG/2ML IJ SOLN
INTRAMUSCULAR | Status: DC | PRN
  Administered 2014-06-24: 4 mg via INTRAVENOUS

## 2014-06-24 MED ORDER — LIDOCAINE HCL (CARDIAC) 20 MG/ML IV SOLN
INTRAVENOUS | Status: AC
  Filled 2014-06-24: qty 5

## 2014-06-24 MED ORDER — ONDANSETRON HCL 4 MG/2ML IJ SOLN
INTRAMUSCULAR | Status: AC
  Filled 2014-06-24: qty 2

## 2014-06-24 MED ORDER — HYDROMORPHONE HCL PF 1 MG/ML IJ SOLN
0.2500 mg | INTRAMUSCULAR | Status: DC | PRN
  Administered 2014-06-24: 0.5 mg via INTRAVENOUS

## 2014-06-24 MED ORDER — PHENAZOPYRIDINE HCL 200 MG PO TABS: 200.0000 mg | ORAL_TABLET | Freq: Three times a day (TID) | ORAL | Status: DC | PRN

## 2014-06-24 MED ORDER — FENTANYL CITRATE 0.05 MG/ML IJ SOLN
25.0000 ug | INTRAMUSCULAR | Status: DC
  Administered 2014-06-24: 25 ug via INTRAVENOUS
  Filled 2014-06-24: qty 2

## 2014-06-24 MED ORDER — CEFAZOLIN SODIUM-DEXTROSE 2-3 GM-% IV SOLR
2.0000 g | INTRAVENOUS | Status: AC
  Administered 2014-06-24: 2 g via INTRAVENOUS

## 2014-06-24 MED ORDER — CEFAZOLIN SODIUM-DEXTROSE 2-3 GM-% IV SOLR
INTRAVENOUS | Status: AC
Start: 2014-06-24 — End: 2014-06-24
  Filled 2014-06-24: qty 50

## 2014-06-24 MED ORDER — DEXAMETHASONE SODIUM PHOSPHATE 10 MG/ML IJ SOLN
INTRAMUSCULAR | Status: DC | PRN
  Administered 2014-06-24: 10 mg via INTRAVENOUS

## 2014-06-24 MED ORDER — METOCLOPRAMIDE HCL 5 MG/ML IJ SOLN
INTRAMUSCULAR | Status: DC | PRN
  Administered 2014-06-24: 10 mg via INTRAVENOUS

## 2014-06-24 MED ORDER — PROPOFOL 10 MG/ML IV BOLUS
INTRAVENOUS | Status: AC
  Filled 2014-06-24: qty 20

## 2014-06-24 MED ORDER — BELLADONNA ALKALOIDS-OPIUM 16.2-60 MG RE SUPP
RECTAL | Status: DC | PRN
  Administered 2014-06-24: 1 via RECTAL

## 2014-06-24 MED ORDER — SENNOSIDES-DOCUSATE SODIUM 8.6-50 MG PO TABS: 1.0000 | ORAL_TABLET | Freq: Two times a day (BID) | ORAL | Status: DC

## 2014-06-24 MED ORDER — LIDOCAINE HCL 2 % EX GEL
CUTANEOUS | Status: AC
  Filled 2014-06-24: qty 10

## 2014-06-24 MED ORDER — FENTANYL CITRATE 0.05 MG/ML IJ SOLN
25.0000 ug | INTRAMUSCULAR | Status: DC | PRN
  Administered 2014-06-24 (×2): 50 ug via INTRAVENOUS

## 2014-06-24 MED ORDER — ESMOLOL HCL 10 MG/ML IV SOLN
INTRAVENOUS | Status: AC
  Filled 2014-06-24: qty 10

## 2014-06-24 MED ORDER — OXYBUTYNIN CHLORIDE 5 MG PO TABS: 5.0000 mg | ORAL_TABLET | Freq: Four times a day (QID) | ORAL | Status: DC | PRN

## 2014-06-24 MED ORDER — KETOROLAC TROMETHAMINE 30 MG/ML IJ SOLN
15.0000 mg | Freq: Once | INTRAMUSCULAR | Status: AC | PRN
  Administered 2014-06-24: 30 mg via INTRAVENOUS

## 2014-06-24 MED ORDER — MIDAZOLAM HCL 2 MG/2ML IJ SOLN
INTRAMUSCULAR | Status: AC
  Filled 2014-06-24: qty 2

## 2014-06-24 MED ORDER — FENTANYL CITRATE 0.05 MG/ML IJ SOLN
25.0000 ug | INTRAMUSCULAR | Status: DC | PRN
  Administered 2014-06-24 (×2): 25 ug via INTRAVENOUS

## 2014-06-24 MED ORDER — BELLADONNA ALKALOIDS-OPIUM 16.2-60 MG RE SUPP
RECTAL | Status: AC
  Filled 2014-06-24: qty 1

## 2014-06-24 MED ORDER — PROPOFOL 10 MG/ML IV BOLUS
INTRAVENOUS | Status: DC | PRN
  Administered 2014-06-24: 100 mg via INTRAVENOUS
  Administered 2014-06-24: 200 mg via INTRAVENOUS

## 2014-06-24 MED ORDER — IOHEXOL 350 MG/ML SOLN
INTRAVENOUS | Status: DC | PRN
  Administered 2014-06-24: 20 mL

## 2014-06-24 MED ORDER — LACTATED RINGERS IV SOLN
INTRAVENOUS | Status: DC
  Administered 2014-06-24: 17:00:00 via INTRAVENOUS

## 2014-06-24 MED ORDER — SODIUM CHLORIDE 0.9 % IR SOLN
Status: DC | PRN
  Administered 2014-06-24: 1000 mL via INTRAVESICAL

## 2014-06-24 MED ORDER — CEPHALEXIN 500 MG PO CAPS: 500.0000 mg | ORAL_CAPSULE | Freq: Three times a day (TID) | ORAL | Status: DC

## 2014-06-24 SURGICAL SUPPLY — 27 items
BAG URO CATCHER STRL LF (DRAPE) ×2 IMPLANT
BASKET LASER NITINOL 1.9FR (BASKET) IMPLANT
BASKET STNLS GEMINI 4WIRE 3FR (BASKET) IMPLANT
BASKET ZERO TIP NITINOL 2.4FR (BASKET) IMPLANT
CATH CLEAR GEL 3F BACKSTOP (CATHETERS) IMPLANT
CATH URET 5FR 28IN CONE TIP (BALLOONS)
CATH URET 5FR 28IN OPEN ENDED (CATHETERS) ×2 IMPLANT
CATH URET 5FR 70CM CONE TIP (BALLOONS) IMPLANT
CATH URET DUAL LUMEN 6-10FR 50 (CATHETERS) IMPLANT
CLOTH BEACON ORANGE TIMEOUT ST (SAFETY) ×2 IMPLANT
DRAPE CAMERA CLOSED 9X96 (DRAPES) ×2 IMPLANT
FIBER LASER FLEXIVA 200 (UROLOGICAL SUPPLIES) IMPLANT
FIBER LASER FLEXIVA 365 (UROLOGICAL SUPPLIES) IMPLANT
GLOVE BIOGEL M 7.0 STRL (GLOVE) ×2 IMPLANT
GOWN STRL REUS W/TWL LRG LVL3 (GOWN DISPOSABLE) ×2 IMPLANT
GUIDEWIRE ANG ZIPWIRE 038X150 (WIRE) IMPLANT
GUIDEWIRE STR DUAL SENSOR (WIRE) ×2 IMPLANT
IV NS IRRIG 3000ML ARTHROMATIC (IV SOLUTION) ×2 IMPLANT
LEGGING LITHOTOMY PAIR STRL (DRAPES) ×2 IMPLANT
PACK CYSTO (CUSTOM PROCEDURE TRAY) ×2 IMPLANT
SCRUB PCMX 4 OZ (MISCELLANEOUS) IMPLANT
SHEATH ACCESS URETERAL 38CM (SHEATH) IMPLANT
SHEATH URET ACCESS 12FR/35CM (UROLOGICAL SUPPLIES) IMPLANT
SHEATH URET ACCESS 12FR/55CM (UROLOGICAL SUPPLIES) IMPLANT
STENT POLARIS 5FRX26 (STENTS) ×2 IMPLANT
SYRINGE IRR TOOMEY STRL 70CC (SYRINGE) IMPLANT
TUBING CONNECTING 10 (TUBING) ×2 IMPLANT

## 2014-06-24 NOTE — H&P (Signed)
Urology History and Physical Exam  CC: Left ureter stones. Left nephrolithiasis.  HPI:  23 year old male presents today for several issues.    #1 ureter stones. New. These are located on the left side.  There are 2 stones.  One stone is 5 mm.  The stone is 4 mm.  They are associated with left flank pain.  He denies fever.  Improved with pain medication.  #2 kidney stones.  Chronic.  The located on the left side.  He has a large stone burden.  The largest stone is a millimeters in the left mid pole.  There are at least 4 other left-sided kidney stones.  He passed several stones several years ago.  He's never had surgery for kidney stones. Nothing makes this better or worse.  We discussed management options along with risks, benefits, alternatives, and likelihood of achieving goals.  He elected to proceed with left ureter stent placement today with staged left ureteroscopy for treatment of his left ureter stones and left kidney stones.  06/21/14: KUB shows two left ureter stones and multiple left kidney stones.  06/17/14 CT: Left mid ureter stone. Multiple left kidney stones. LLP 6mm, 1mm, 3mm, 3mm.  LMP 8 mm LUP 4 mm   PMH: Past Medical History  Diagnosis Date  . Kidney stone   . Migraine     PSH: Past Surgical History  Procedure Laterality Date  . Foot capsule release w/ percutaneous heel cord lengthening, tibial tendon transfer    . Lymph gland excision      Allergies: Allergies  Allergen Reactions  . Codeine Swelling    Lips swell  . Tape Rash    Adhesive tape     Medications: No prescriptions prior to admission     Social History: History   Social History  . Marital Status: Single    Spouse Name: N/A    Number of Children: N/A  . Years of Education: N/A   Occupational History  . Not on file.   Social History Main Topics  . Smoking status: Never Smoker   . Smokeless tobacco: Never Used  . Alcohol Use: Yes     Comment: occ  . Drug Use: No  . Sexual  Activity: Yes    Birth Control/ Protection: None   Other Topics Concern  . Not on file   Social History Narrative  . No narrative on file    Family History: No family history on file.  Review of Systems: Positive: Left flank pain. Nausea.  Negative: Fever, SOB, or chest pain.  A further 10 point review of systems was negative except what is listed in the HPI.  Physical Exam: Filed Vitals:   06/24/14 1536  BP: 148/93  Pulse: 102  Temp: 97.4 F (36.3 C)  Resp: 18    General: No acute distress.  Awake. Head:  Normocephalic.  Atraumatic. ENT:  EOMI.  Mucous membranes moist Neck:  Supple.  No lymphadenopathy. CV:  S1 present. S2 present. Regular rate. Pulmonary: Equal effort bilaterally.  Clear to auscultation bilaterally. Abdomen: Soft.  Non- tender to palpation. Skin:  Normal turgor.  No visible rash. Extremity: No gross deformity of bilateral upper extremities.  No gross deformity of    bilateral lower extremities. Neurologic: Alert. Appropriate mood.    Studies:  No results found for this basename: HGB, WBC, PLT,  in the last 72 hours  Recent Labs     06/21/14  2253  NA  139  K  4.1  CL  99  CO2  26  BUN  24*  CREATININE  1.63*  CALCIUM  9.8  GFRNONAA  58*  GFRAA  68*     No results found for this basename: PT, INR, APTT,  in the last 72 hours   No components found with this basename: ABG,     Assessment:  Left ureter stones. Left nephrolithiasis.  Plan: To OR for cystoscopy, left retrograde pyelogram, left ureter stent placement.  He is planning to have a staged left ureteroscopy in the future.

## 2014-06-24 NOTE — Anesthesia Postprocedure Evaluation (Signed)
  Anesthesia Post-op Note  Patient: Thomas Howell  Procedure(s) Performed: Procedure(s) (LRB): CYSTOSCOPY WITH RETROGRADE PYELOGRAM,  AND STENT PLACEMENT (Left)  Patient Location: PACU  Anesthesia Type: General  Level of Consciousness: awake and alert   Airway and Oxygen Therapy: Patient Spontanous Breathing  Post-op Pain: mild  Post-op Assessment: Post-op Vital signs reviewed, Patient's Cardiovascular Status Stable, Respiratory Function Stable, Patent Airway and No signs of Nausea or vomiting  Last Vitals:  Filed Vitals:   06/24/14 1833  BP: 132/79  Pulse: 112  Temp:   Resp: 16    Post-op Vital Signs: stable   Complications: No apparent anesthesia complications

## 2014-06-24 NOTE — Discharge Instructions (Signed)
DISCHARGE INSTRUCTIONS FOR KIDNEY STONES OR URETERAL STENT  MEDICATIONS:   1.  Resume all your other meds from home.  ACTIVITY 1. No strenuous activity x 1week 2. No driving while on narcotic pain medications 3. Drink plenty of water 4. Continue to walk at home - you can still get blood clots when you are at home, so keep active, but don't over do it. 5. May return to work in 3 days.  BATHING 1. You can shower or take a bath.    SIGNS/SYMPTOMS TO CALL: 1. Please call us if you have a fever greater than 101.5, uncontrolled  nausea/vomiting, uncontrolled pain, dizziness, unable to urinate, chest pain, shortness of breath, leg swelling, leg pain, redness around wound, drainage from wound, or any other concerns or questions.  You can reach us at [phone].   Post Anesthesia Home Care Instructions  Activity: Get plenty of rest for the remainder of the day. A responsible adult should stay with you for 24 hours following the procedure.  For the next 24 hours, DO NOT: -Drive a car -Advertising copywriterperate machinery -Drink alcoholic beverages -Take any medication unless instructed by your physician -Make any legal decisions or sign important papers.  Meals: Start with liquid foods such as gelatin or soup. Progress to regular foods as tolerated. Avoid greasy, spicy, heavy foods. If nausea and/or vomiting occur, drink only clear liquids until the nausea and/or vomiting subsides. Call your physician if vomiting continues.  Special Instructions/Symptoms: Your throat may feel dry or sore from the anesthesia or the breathing tube placed in your throat during surgery. If this causes discomfort, gargle with warm salt water. The discomfort should disappear within 24 hours. CYSTOSCOPY HOME CARE INSTRUCTIONS  Activity: Rest for the remainder of the day.  Do not drive or operate equipment today.  You may resume normal activities in one to two days as instructed by your physician.   Meals: Drink plenty  of liquids and eat light foods such as gelatin or soup this evening.  You may return to a normal meal plan tomorrow.  Return to Work: You may return to work in one to two days or as instructed by your physician.  Special Instructions / Symptoms: Call your physician if any of these symptoms occur:   -persistent or heavy bleeding  -bleeding which continues after first few urination  -large blood clots that are difficult to pass  -urine stream diminishes or stops completely  -fever equal to or higher than 101 degrees Farenheit.  -cloudy urine with a strong, foul odor  -severe pain  Females should always wipe from front to back after elimination.  You may feel some burning pain when you urinate.  This should disappear with time.  Applying moist heat to the lower abdomen or a hot tub bath may help relieve the pain. \  Follow-Up / Date of Return Visit to Your Physician:  *** Call for an appointment to arrange follow-up.  Patient Signature:  ________________________________________________________  Nurse's Signature:  ________________________________________________________

## 2014-06-24 NOTE — Op Note (Signed)
Urology Operative Report  Date of Procedure: 06/24/14  Surgeon: Natalia Leatherwoodaniel Declyn Offield, MD Assistant:  None  Preoperative Diagnosis: Left ureter stones. Left nephrolithiasis. Postoperative Diagnosis:  Same  Procedure(s): Cystoscopy Left ureter stent placement (5 x 26 polaris without tether). Left retrograde pyelogram with interpretation.  Estimated blood loss: None  Specimen: None  Drains: None  Complications: None  Findings: Left mid-proximal ureter stones. Left renal pelvic stone.  History of present illness: 23 year old male presented to clinic with 2 left proximal to mid ureter stones and multiple stones and left kidney. He elected to proceed with left ureter stent placement with plans for staged left ureteroscopy by one of my partners in the future.   Procedure in detail: After informed consent was obtained, the patient was taken to the operating room. They were placed in the supine position. SCDs were turned on and in place. IV antibiotics were infused, and general anesthesia was induced. A timeout was performed in which the correct patient, surgical site, and procedure were identified and agreed upon by the team.  The patient was placed in a dorsolithotomy position, making sure to pad all pertinent neurovascular pressure points. The genitals were prepped and draped in the usual sterile fashion.  A rigid cystoscope was advanced through the urethra and into the bladder. The bladder was drained and then fully distended and evaluated in a systematic fashion to visualize the entire surface of the bladder. This was negative for bladder tumors.  Attention was turned the left ureter orifice. It was cannulated with a 5 [place] ureter catheter. I injected 10 cc of Omnipaque which revealed 2 filling defects in the mid to proximal ureter consistent with previously seen stones on CT scan. Contrast would not pass beyond the stones easily. I then advanced the ureter catheter several more  centimeters under fluoroscopy and injected an additional 10 cc of Omnipaque. This revealed an outline of the left renal pelvic stone which may be intermittently obstructing the left ureter.  I removed the left ureter catheter and placed a sensor wire under fluoroscopy up the left ureter and into the left renal pelvis. I then placed a 5 x 26 Polaris stent without tether over the wire through the cystoscope and under fluoroscopy with ease. This was deployed with a good curl in the left renal pelvis and loops within the bladder. There was return of clear reflux from the urine.  The bladder was drained and the cystoscope was removed. I placed 10 cc of lidocaine jelly into the urethra and then a belladonna and opium suppository to rectum.  He's placed in a supine position, anesthesia was reversed, and [facility].  He'll be given 3 days of Keflex. He will followup for staged left ureteroscopy with one of my partners.  All counts were correct at the end of the case.

## 2014-06-24 NOTE — Transfer of Care (Signed)
Immediate Anesthesia Transfer of Care Note  Patient: Thomas Howell  Procedure(s) Performed: Procedure(s) (LRB): CYSTOSCOPY WITH RETROGRADE PYELOGRAM,  AND STENT PLACEMENT (Left)  Patient Location: PACU  Anesthesia Type: General  Level of Consciousness: sedated, patient cooperative and responds to stimulation  Airway & Oxygen Therapy: Patient Spontanous Breathing and Patient connected to face mask oxgen  Post-op Assessment: Report given to PACU RN and Post -op Vital signs reviewed and stable  Post vital signs: Reviewed and stable  Complications: No apparent anesthesia complications

## 2014-06-24 NOTE — Anesthesia Preprocedure Evaluation (Addendum)
Anesthesia Evaluation  Patient identified by MRN, date of birth, ID band Patient awake    Reviewed: Allergy & Precautions, H&P , NPO status , Patient's Chart, lab work & pertinent test results  Airway Mallampati: II TM Distance: >3 FB Neck ROM: Full    Dental no notable dental hx.    Pulmonary neg pulmonary ROS,  breath sounds clear to auscultation  Pulmonary exam normal       Cardiovascular negative cardio ROS  Rhythm:Regular Rate:Normal     Neuro/Psych negative neurological ROS  negative psych ROS   GI/Hepatic negative GI ROS, Neg liver ROS,   Endo/Other  negative endocrine ROS  Renal/GU negative Renal ROS  negative genitourinary   Musculoskeletal negative musculoskeletal ROS (+)   Abdominal   Peds negative pediatric ROS (+)  Hematology negative hematology ROS (+)   Anesthesia Other Findings   Reproductive/Obstetrics negative OB ROS                           Anesthesia Physical Anesthesia Plan  ASA: I  Anesthesia Plan: General   Post-op Pain Management:    Induction: Intravenous  Airway Management Planned: Oral ETT  Additional Equipment:   Intra-op Plan:   Post-operative Plan: Extubation in OR  Informed Consent: I have reviewed the patients History and Physical, chart, labs and discussed the procedure including the risks, benefits and alternatives for the proposed anesthesia with the patient or authorized representative who has indicated his/her understanding and acceptance.   Dental advisory given  Plan Discussed with: CRNA and Surgeon  Anesthesia Plan Comments: (Solids at 1000)       Anesthesia Quick Evaluation

## 2014-06-25 ENCOUNTER — Other Ambulatory Visit: Payer: Self-pay | Admitting: Urology

## 2014-06-25 ENCOUNTER — Encounter (HOSPITAL_COMMUNITY): Payer: Self-pay | Admitting: Urology

## 2014-06-28 ENCOUNTER — Encounter (HOSPITAL_BASED_OUTPATIENT_CLINIC_OR_DEPARTMENT_OTHER): Payer: Self-pay | Admitting: *Deleted

## 2014-07-02 ENCOUNTER — Encounter (HOSPITAL_BASED_OUTPATIENT_CLINIC_OR_DEPARTMENT_OTHER): Payer: Self-pay | Admitting: *Deleted

## 2014-07-02 NOTE — Progress Notes (Signed)
NPO AFTER MN. ARRIVE AT 0600. CURRENT LAB RESULTS IN CHART AND EPIC. MAY TAKE PAIN MEDS IF NEEDED W/ SIPS OF WATER AM DOS EXCEPT NO NAPROXEN OR IBUPROFEN.

## 2014-07-05 ENCOUNTER — Encounter (HOSPITAL_BASED_OUTPATIENT_CLINIC_OR_DEPARTMENT_OTHER): Payer: BC Managed Care – PPO | Admitting: Anesthesiology

## 2014-07-05 ENCOUNTER — Encounter (HOSPITAL_BASED_OUTPATIENT_CLINIC_OR_DEPARTMENT_OTHER): Payer: Self-pay | Admitting: *Deleted

## 2014-07-05 ENCOUNTER — Ambulatory Visit (HOSPITAL_BASED_OUTPATIENT_CLINIC_OR_DEPARTMENT_OTHER)
Admission: RE | Admit: 2014-07-05 | Discharge: 2014-07-05 | Disposition: A | Discharge status: Home / Self Care | Payer: BC Managed Care – PPO | Source: Ambulatory Visit | Attending: Urology | Admitting: Urology

## 2014-07-05 ENCOUNTER — Encounter (HOSPITAL_BASED_OUTPATIENT_CLINIC_OR_DEPARTMENT_OTHER)
Admission: RE | Disposition: A | Discharge status: Home / Self Care | Payer: Self-pay | Source: Ambulatory Visit | Attending: Urology

## 2014-07-05 ENCOUNTER — Ambulatory Visit (HOSPITAL_BASED_OUTPATIENT_CLINIC_OR_DEPARTMENT_OTHER): Payer: BC Managed Care – PPO | Admitting: Anesthesiology

## 2014-07-05 DIAGNOSIS — N200 Calculus of kidney: Secondary | ICD-10-CM | POA: Insufficient documentation

## 2014-07-05 DIAGNOSIS — N201 Calculus of ureter: Secondary | ICD-10-CM | POA: Diagnosis not present

## 2014-07-05 HISTORY — PX: HOLMIUM LASER APPLICATION: SHX5852

## 2014-07-05 HISTORY — DX: Presence of spectacles and contact lenses: Z97.3

## 2014-07-05 HISTORY — PX: CYSTOSCOPY WITH RETROGRADE PYELOGRAM, URETEROSCOPY AND STENT PLACEMENT: SHX5789

## 2014-07-05 SURGERY — CYSTOURETEROSCOPY, WITH RETROGRADE PYELOGRAM AND STENT INSERTION
Anesthesia: General | Site: Ureter | Laterality: Left

## 2014-07-05 MED ORDER — FENTANYL CITRATE 0.05 MG/ML IJ SOLN
INTRAMUSCULAR | Status: DC | PRN
  Administered 2014-07-05 (×2): 25 ug via INTRAVENOUS
  Administered 2014-07-05: 50 ug via INTRAVENOUS
  Administered 2014-07-05 (×4): 25 ug via INTRAVENOUS

## 2014-07-05 MED ORDER — PROMETHAZINE HCL 25 MG/ML IJ SOLN
6.2500 mg | INTRAMUSCULAR | Status: DC | PRN
Start: 2014-07-05 — End: 2014-07-05
  Filled 2014-07-05: qty 1

## 2014-07-05 MED ORDER — LIDOCAINE HCL (CARDIAC) 20 MG/ML IV SOLN
INTRAVENOUS | Status: DC | PRN
  Administered 2014-07-05: 100 mg via INTRAVENOUS

## 2014-07-05 MED ORDER — LACTATED RINGERS IV SOLN
INTRAVENOUS | Status: DC
  Administered 2014-07-05: 07:00:00 via INTRAVENOUS
  Filled 2014-07-05: qty 1000

## 2014-07-05 MED ORDER — CEFAZOLIN SODIUM-DEXTROSE 2-3 GM-% IV SOLR
2.0000 g | INTRAVENOUS | Status: AC
  Administered 2014-07-05: 2 g via INTRAVENOUS
  Filled 2014-07-05: qty 50

## 2014-07-05 MED ORDER — DEXAMETHASONE SODIUM PHOSPHATE 4 MG/ML IJ SOLN
INTRAMUSCULAR | Status: DC | PRN
  Administered 2014-07-05: 10 mg via INTRAVENOUS

## 2014-07-05 MED ORDER — KETOROLAC TROMETHAMINE 30 MG/ML IJ SOLN
INTRAMUSCULAR | Status: DC | PRN
  Administered 2014-07-05: 30 mg via INTRAVENOUS

## 2014-07-05 MED ORDER — FENTANYL CITRATE 0.05 MG/ML IJ SOLN
INTRAMUSCULAR | Status: AC
  Filled 2014-07-05: qty 2

## 2014-07-05 MED ORDER — OXYCODONE-ACETAMINOPHEN 5-325 MG PO TABS: 1.0000 | ORAL_TABLET | ORAL | Status: DC | PRN

## 2014-07-05 MED ORDER — PHENAZOPYRIDINE HCL 200 MG PO TABS
200.0000 mg | ORAL_TABLET | Freq: Three times a day (TID) | ORAL | Status: AC | PRN
  Administered 2014-07-05: 200 mg via ORAL
  Filled 2014-07-05: qty 1

## 2014-07-05 MED ORDER — STERILE WATER FOR IRRIGATION IR SOLN
Status: DC | PRN
  Administered 2014-07-05: 500 mL

## 2014-07-05 MED ORDER — PROPOFOL 10 MG/ML IV BOLUS
INTRAVENOUS | Status: DC | PRN
  Administered 2014-07-05: 250 mg via INTRAVENOUS

## 2014-07-05 MED ORDER — ONDANSETRON HCL 4 MG/2ML IJ SOLN
INTRAMUSCULAR | Status: DC | PRN
  Administered 2014-07-05: 4 mg via INTRAVENOUS

## 2014-07-05 MED ORDER — MIDAZOLAM HCL 2 MG/2ML IJ SOLN
INTRAMUSCULAR | Status: AC
  Filled 2014-07-05: qty 4

## 2014-07-05 MED ORDER — METOCLOPRAMIDE HCL 5 MG/ML IJ SOLN
INTRAMUSCULAR | Status: DC | PRN
  Administered 2014-07-05: 10 mg via INTRAVENOUS

## 2014-07-05 MED ORDER — FENTANYL CITRATE 0.05 MG/ML IJ SOLN
INTRAMUSCULAR | Status: AC
  Filled 2014-07-05: qty 4

## 2014-07-05 MED ORDER — LACTATED RINGERS IV SOLN
INTRAVENOUS | Status: DC | PRN
  Administered 2014-07-05 (×2): via INTRAVENOUS

## 2014-07-05 MED ORDER — OXYCODONE-ACETAMINOPHEN 5-325 MG PO TABS
ORAL_TABLET | ORAL | Status: AC
  Filled 2014-07-05: qty 1

## 2014-07-05 MED ORDER — SULFAMETHOXAZOLE-TMP DS 800-160 MG PO TABS: 1.0000 | ORAL_TABLET | Freq: Every day | ORAL | Status: DC

## 2014-07-05 MED ORDER — MIDAZOLAM HCL 5 MG/5ML IJ SOLN
INTRAMUSCULAR | Status: DC | PRN
  Administered 2014-07-05: 2 mg via INTRAVENOUS
  Administered 2014-07-05 (×2): 1 mg via INTRAVENOUS

## 2014-07-05 MED ORDER — OXYCODONE-ACETAMINOPHEN 5-325 MG PO TABS
1.0000 | ORAL_TABLET | ORAL | Status: AC | PRN
Start: 2014-07-05 — End: 2014-07-05
  Administered 2014-07-05: 1 via ORAL
  Filled 2014-07-05: qty 2

## 2014-07-05 MED ORDER — ACETAMINOPHEN 10 MG/ML IV SOLN
INTRAVENOUS | Status: DC | PRN
  Administered 2014-07-05: 1000 mg via INTRAVENOUS

## 2014-07-05 MED ORDER — IOHEXOL 350 MG/ML SOLN
INTRAVENOUS | Status: DC | PRN
  Administered 2014-07-05: 5 mL

## 2014-07-05 MED ORDER — LACTATED RINGERS IV SOLN
INTRAVENOUS | Status: DC
  Filled 2014-07-05: qty 1000

## 2014-07-05 MED ORDER — MEPERIDINE HCL 25 MG/ML IJ SOLN
6.2500 mg | INTRAMUSCULAR | Status: DC | PRN
  Filled 2014-07-05: qty 1

## 2014-07-05 MED ORDER — FENTANYL CITRATE 0.05 MG/ML IJ SOLN
25.0000 ug | INTRAMUSCULAR | Status: DC | PRN
  Administered 2014-07-05: 50 ug via INTRAVENOUS
  Filled 2014-07-05: qty 1

## 2014-07-05 MED ORDER — PHENAZOPYRIDINE HCL 100 MG PO TABS
ORAL_TABLET | ORAL | Status: AC
  Filled 2014-07-05: qty 2

## 2014-07-05 MED ORDER — SODIUM CHLORIDE 0.9 % IR SOLN
Status: DC | PRN
  Administered 2014-07-05: 1000 mL
  Administered 2014-07-05: 3000 mL

## 2014-07-05 SURGICAL SUPPLY — 22 items
ADAPTER CATH URET PLST 4-6FR (CATHETERS) IMPLANT
BAG DRAIN URO-CYSTO SKYTR STRL (DRAIN) ×2 IMPLANT
CANISTER SUCT LVC 12 LTR MEDI- (MISCELLANEOUS) ×2 IMPLANT
CATH INTERMIT  6FR 70CM (CATHETERS) ×2 IMPLANT
CATH URET DUAL LUMEN 6-10FR 50 (CATHETERS) ×2 IMPLANT
CLOTH BEACON ORANGE TIMEOUT ST (SAFETY) ×2 IMPLANT
DRAPE CAMERA CLOSED 9X96 (DRAPES) ×2 IMPLANT
EXTRACTOR STONE NITINOL NGAGE (UROLOGICAL SUPPLIES) ×2 IMPLANT
FIBER LASER TRAC TIP (UROLOGICAL SUPPLIES) ×2 IMPLANT
GLOVE BIO SURGEON STRL SZ 6.5 (GLOVE) ×2 IMPLANT
GLOVE BIO SURGEON STRL SZ7.5 (GLOVE) ×2 IMPLANT
GLOVE INDICATOR 6.5 STRL GRN (GLOVE) ×4 IMPLANT
GOWN STRL REUS W/ TWL LRG LVL3 (GOWN DISPOSABLE) ×1 IMPLANT
GOWN STRL REUS W/TWL LRG LVL3 (GOWN DISPOSABLE) ×1
GOWN STRL REUS W/TWL XL LVL3 (GOWN DISPOSABLE) ×2 IMPLANT
GUIDEWIRE 0.038 PTFE COATED (WIRE) ×2 IMPLANT
GUIDEWIRE STR DUAL SENSOR (WIRE) ×2 IMPLANT
IV NS IRRIG 3000ML ARTHROMATIC (IV SOLUTION) ×4 IMPLANT
PACK CYSTOSCOPY (CUSTOM PROCEDURE TRAY) ×2 IMPLANT
SHEATH ACCESS URETERAL 38CM (SHEATH) ×2 IMPLANT
STENT URET 6FRX26 CONTOUR (STENTS) ×2 IMPLANT
SYR CONTROL 10ML LL (SYRINGE) ×2 IMPLANT

## 2014-07-05 NOTE — Op Note (Signed)
Preoperative diagnosis: Left ureteral stone, left renal stones Postoperative diagnosis: Left ureteral stone, left renal stones, left lower pole calyceal diverticulum  Procedure: Cystoscopy Left retrograde pyelogram Left ureteroscopy with holmium laser lithotripsy stone basket extraction Left ureteral stent placement  Surgeon: Rosemae Mcquown  Type of anesthesia: Gen.  Findings:    Left retrograde pyelogram-this outlined a single ureter single collecting system unit with a filling defect in the proximal ureter consistent with the stone. The collecting system appeared normal without dilation.  Ureteroscopy-Left proximal ureteral stone, Left lower pole calyceal diverticulum with multiple stones, left upper pole stone.  Description of procedure: After consent was obtained patient brought to the operating room. After adequate anesthesia he is placed in lithotomy position and prepped and draped in the usual sterile fashion. A timeout was performed to confirm the patient and procedure. Cystoscope was passed per urethra and the left ureteral stent was grasped and removed through the urethral meatus. A sensor wire was advanced through the stent and coiled in the left upper pole collecting system. The stent was removed. A dual-lumen exchange catheter was advanced over the wire and through the side-port contrast was injected retrograde. A second guidewire was then advanced and coiled in the collecting system. A dual-lumen was removed. A dual-lumen went up easily. Over the guidewire an access sheath was advanced without difficulty. The inner cannula and wire removed. A digital ureteroscope was advanced and the left proximal ureteral stone was encountered. It was lasered at a setting of 0.2 and 40 as it grew smaller in size it washed into the left renal pelvis. He was followed into the kidney and grasped with an engaged basket and put in the left upper pole adjacent to the other smaller stone. The other calyces were  inspected and I did not see any stones in the midpole but in the lower pole there is a collection of stones underneath the mucosa and a calyceal diverticulum. The engaged basket was used to reach inside the mucosa and grab to the stones. The other ones can be seen submucosal but these channels appear to be connected. Therefore the basket was removed and a 200  laser fiber passed. In the upper pole the stones were fragmented to small 1-2 mm pieces. In the lower pole mucosa was incised which released 3 stones and these were sequentially grasped and removed along with the largest fragments from the upper pole which contained also the remainder of the ureteral stent. I believe the anterior lower pole stones were still present and visible on fluoroscopy hour this may be also a combination of contrast and the calyx or diverticulum. 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 Therefore I couldn't get into the lower pole and and also look anteriorly. All visible stone of significance was removed from all the calyces on final inspection. The access sheath was backed out on the ureteroscope and the ureter carefully examined noted to be free of injury and stone free. The wire was backloaded on the cystoscope and a 6 x 26 cm left ureteral stent was advanced. The wire was removed and coil seen in the collecting system and a good coil in the bladder. The bladder was drained and the scope removed. The string was taped the patient. He was awakened and taken to the recovery room in stable condition.  Complications: None Blood loss: Minimal Drains: 6 x 26 cm left ureteral stent with string Specimens: Stone fragments  to office lab

## 2014-07-05 NOTE — Discharge Instructions (Signed)
Ureteral Stent Implantation, Care After Refer to this sheet in the next few weeks. These instructions provide you with information on caring for yourself after your procedure. Your health care provider may also give you more specific instructions. Your treatment has been planned according to current medical practices, but problems sometimes occur. Call your health care provider if you have any problems or questions after your procedure. WHAT TO EXPECT AFTER THE PROCEDURE You should be back to normal activity within 48 hours after the procedure. Nausea and vomiting may occur and are commonly the result of anesthesia. It is common to experience sharp pain in the back or lower abdomen and penis with voiding. This is caused by movement of the ends of the stent with the act of urinating.It usually goes away within minutes after you have stopped urinating. HOME CARE INSTRUCTIONS Make sure to drink plenty of fluids. You may have small amounts of bleeding, causing your urine to be red. This is normal. Certain movements may trigger pain or a feeling that you need to urinate. You may be given medicines to prevent infection or bladder spasms. Be sure to take all medicines as directed. Only take over-the-counter or prescription medicines for pain, discomfort, or fever as directed by your health care provider. Do not take aspirin, as this can make bleeding worse.  +_+_+_+REMOVAL OF THE STENT - remove the stent Tuesday morning, July 09, 2014, by pulling the string was slow steady pressure. He needed to were common to the office and we can remove it. All then plan to see you back in 6 weeks for left renal ultrasound.   SEEK MEDICAL CARE IF:  You experience increasing pain.  Your pain medicine is not working. SEEK IMMEDIATE MEDICAL CARE IF:  Your urine is dark red or has blood clots.  You are leaking urine (incontinent).  You have a fever, chills, feeling sick to your stomach (nausea), or vomiting.  Your  pain is not relieved by pain medicine.  The end of the stent comes out of the urethra.  You are unable to urinate.  Document Released: 08/08/2013 Document Revised: 12/11/2013 Document Reviewed: 08/08/2013 [facility] Patient Information 2015 [employer], [place]. This information is not intended to replace advice given to you by your health care provider. Make sure you discuss any questions you have with your health care provider.   Post Anesthesia Home Care Instructions  Activity: Get plenty of rest for the remainder of the day. A responsible adult should stay with you for 24 hours following the procedure.  For the next 24 hours, DO NOT: -Drive a car -Advertising copywriterperate machinery -Drink alcoholic beverages -Take any medication unless instructed by your physician -Make any legal decisions or sign important papers.  Meals: Start with liquid foods such as gelatin or soup. Progress to regular foods as tolerated. Avoid greasy, spicy, heavy foods. If nausea and/or vomiting occur, drink only clear liquids until the nausea and/or vomiting subsides. Call your physician if vomiting continues.  Special Instructions/Symptoms: Your throat may feel dry or sore from the anesthesia or the breathing tube placed in your throat during surgery. If this causes discomfort, gargle with warm salt water. The discomfort should disappear within 24 hours.

## 2014-07-05 NOTE — Interval H&P Note (Signed)
History and Physical Interval Note:  07/05/2014 7:27 AM  Thomas Howell  has presented today for surgery, with the diagnosis of LEFT URETERAL STONE  The various methods of treatment have been discussed with the patient and family. After consideration of risks, benefits and other options for treatment, the patient has consented to  Procedure(s): CYSTO/LEFT URETEROSCOPY/LEFT RETROGRADE PYELOGRAM/LEFT STENT PLACEMENT (Left) LASER LITHO (Left) as a surgical intervention .  The patient's history has been reviewed, patient examined, no change in status, stable for surgery.  I have reviewed the patient's chart and labs.  Pt has been well. No f/c, emesis, or dysuria. He has had some flank pain, hematuria and nausea typical of a stent - mild symptoms. I discussed with the patient and his family (mom, dad) the nature, potential benefits, risks and alternatives to left URS/laser/stent, including side effects of the proposed treatment, the likelihood of the patient achieving the goals of the procedure, and any potential problems that might occur during the procedure or recuperation. All questions answered. Patient elects to proceed. His office urine Cx was negative. We discussed he amy need a staged procedure with multiple procedures.     [name], Tyeler Goedken

## 2014-07-05 NOTE — H&P (View-Only) (Signed)
Urology History and Physical Exam  CC: Left ureter stones. Left nephrolithiasis.  HPI:  23 year old male presents today for several issues.    #1 ureter stones. New. These are located on the left side.  There are 2 stones.  One stone is 5 mm.  The stone is 4 mm.  They are associated with left flank pain.  He denies fever.  Improved with pain medication.  #2 kidney stones.  Chronic.  The located on the left side.  He has a large stone burden.  The largest stone is a millimeters in the left mid pole.  There are at least 4 other left-sided kidney stones.  He passed several stones several years ago.  He's never had surgery for kidney stones. Nothing makes this better or worse.  We discussed management options along with risks, benefits, alternatives, and likelihood of achieving goals.  He elected to proceed with left ureter stent placement today with staged left ureteroscopy for treatment of his left ureter stones and left kidney stones.  06/21/14: KUB shows two left ureter stones and multiple left kidney stones.  06/17/14 CT: Left mid ureter stone. Multiple left kidney stones. LLP 6mm, 1mm, 3mm, 3mm.  LMP 8 mm LUP 4 mm   PMH: Past Medical History  Diagnosis Date  . Kidney stone   . Migraine     PSH: Past Surgical History  Procedure Laterality Date  . Foot capsule release w/ percutaneous heel cord lengthening, tibial tendon transfer    . Lymph gland excision      Allergies: Allergies  Allergen Reactions  . Codeine Swelling    Lips swell  . Tape Rash    Adhesive tape     Medications: No prescriptions prior to admission     Social History: History   Social History  . Marital Status: Single    Spouse Name: N/A    Number of Children: N/A  . Years of Education: N/A   Occupational History  . Not on file.   Social History Main Topics  . Smoking status: Never Smoker   . Smokeless tobacco: Never Used  . Alcohol Use: Yes     Comment: occ  . Drug Use: No  . Sexual  Activity: Yes    Birth Control/ Protection: None   Other Topics Concern  . Not on file   Social History Narrative  . No narrative on file    Family History: No family history on file.  Review of Systems: Positive: Left flank pain. Nausea.  Negative: Fever, SOB, or chest pain.  A further 10 point review of systems was negative except what is listed in the HPI.  Physical Exam: Filed Vitals:   06/24/14 1536  BP: 148/93  Pulse: 102  Temp: 97.4 F (36.3 C)  Resp: 18    General: No acute distress.  Awake. Head:  Normocephalic.  Atraumatic. ENT:  EOMI.  Mucous membranes moist Neck:  Supple.  No lymphadenopathy. CV:  S1 present. S2 present. Regular rate. Pulmonary: Equal effort bilaterally.  Clear to auscultation bilaterally. Abdomen: Soft.  Non- tender to palpation. Skin:  Normal turgor.  No visible rash. Extremity: No gross deformity of bilateral upper extremities.  No gross deformity of    bilateral lower extremities. Neurologic: Alert. Appropriate mood.    Studies:  No results found for this basename: HGB, WBC, PLT,  in the last 72 hours  Recent Labs     06/21/14  2253  NA  139  K  4.1  CL  99  CO2  26  BUN  24*  CREATININE  1.63*  CALCIUM  9.8  GFRNONAA  58*  GFRAA  68*     No results found for this basename: PT, INR, APTT,  in the last 72 hours   No components found with this basename: ABG,     Assessment:  Left ureter stones. Left nephrolithiasis.  Plan: To OR for cystoscopy, left retrograde pyelogram, left ureter stent placement.  He is planning to have a staged left ureteroscopy in the future. 

## 2014-07-05 NOTE — Transfer of Care (Signed)
Immediate Anesthesia Transfer of Care Note  Patient: Thomas Howell  Procedure(s) Performed: Procedure(s) (LRB): CYSTO/LEFT URETEROSCOPY/LEFT RETROGRADE PYELOGRAM/LEFT STENT PLACEMENT (Left) LASER LITHO (Left)  Patient Location: PACU  Anesthesia Type: General  Level of Consciousness: awake, sedated, patient cooperative and responds to stimulation  Airway & Oxygen Therapy: Patient Spontanous Breathing and Patient connected to face mask oxygen  Post-op Assessment: Report given to PACU RN, Post -op Vital signs reviewed and stable and Patient moving all extremities  Post vital signs: Reviewed and stable  Complications: No apparent anesthesia complications

## 2014-07-05 NOTE — Anesthesia Procedure Notes (Signed)
Procedure Name: LMA Insertion Date/Time: 07/05/2014 7:35 AM Performed by: Jessica PriestBEESON, Thomas Paulsen C Pre-anesthesia Checklist: Patient identified, Emergency Drugs available, Suction available and Patient being monitored Patient Re-evaluated:Patient Re-evaluated prior to inductionOxygen Delivery Method: Circle System Utilized Preoxygenation: Pre-oxygenation with 100% oxygen Intubation Type: IV induction Ventilation: Mask ventilation without difficulty LMA: LMA inserted LMA Size: 4.0 Number of attempts: 1 Airway Equipment and Method: bite block Placement Confirmation: positive ETCO2 Tube secured with: Tape Dental Injury: Teeth and Oropharynx as per pre-operative assessment

## 2014-07-05 NOTE — Anesthesia Preprocedure Evaluation (Addendum)
Anesthesia Evaluation  Patient identified by MRN, date of birth, ID band Patient awake    Reviewed: Allergy & Precautions, H&P , NPO status , Patient's Chart, lab work & pertinent test results  History of Anesthesia Complications (+) PONV  Airway Mallampati: II TM Distance: >3 FB Neck ROM: Full    Dental no notable dental hx.    Pulmonary neg pulmonary ROS,  breath sounds clear to auscultation  Pulmonary exam normal       Cardiovascular negative cardio ROS  Rhythm:Regular Rate:Normal     Neuro/Psych negative neurological ROS  negative psych ROS   GI/Hepatic negative GI ROS, Neg liver ROS,   Endo/Other  negative endocrine ROS  Renal/GU negative Renal ROS  negative genitourinary   Musculoskeletal negative musculoskeletal ROS (+)   Abdominal   Peds negative pediatric ROS (+)  Hematology negative hematology ROS (+)   Anesthesia Other Findings   Reproductive/Obstetrics negative OB ROS                          Anesthesia Physical Anesthesia Plan  ASA: I  Anesthesia Plan: General   Post-op Pain Management:    Induction: Intravenous  Airway Management Planned: LMA  Additional Equipment:   Intra-op Plan:   Post-operative Plan: Extubation in OR  Informed Consent: I have reviewed the patients History and Physical, chart, labs and discussed the procedure including the risks, benefits and alternatives for the proposed anesthesia with the patient or authorized representative who has indicated his/her understanding and acceptance.   Dental advisory given  Plan Discussed with: CRNA  Anesthesia Plan Comments:         Anesthesia Quick Evaluation

## 2014-07-05 NOTE — Anesthesia Postprocedure Evaluation (Signed)
  Anesthesia Post-op Note  Patient: Thomas Howell  Procedure(s) Performed: Procedure(s) (LRB): CYSTO/LEFT URETEROSCOPY/LEFT RETROGRADE PYELOGRAM/LEFT STENT PLACEMENT (Left) LASER LITHO (Left)  Patient Location: PACU  Anesthesia Type: General  Level of Consciousness: awake and alert   Airway and Oxygen Therapy: Patient Spontanous Breathing  Post-op Pain: mild  Post-op Assessment: Post-op Vital signs reviewed, Patient's Cardiovascular Status Stable, Respiratory Function Stable, Patent Airway and No signs of Nausea or vomiting  Last Vitals:  Filed Vitals:   07/05/14 1040  BP: 136/84  Pulse: 100  Temp: 36.2 C  Resp: 16    Post-op Vital Signs: stable   Complications: No apparent anesthesia complications

## 2014-07-08 ENCOUNTER — Encounter (HOSPITAL_BASED_OUTPATIENT_CLINIC_OR_DEPARTMENT_OTHER): Payer: Self-pay | Admitting: Urology

## 2014-07-31 ENCOUNTER — Other Ambulatory Visit: Payer: Self-pay | Admitting: Family Medicine

## 2014-08-09 ENCOUNTER — Other Ambulatory Visit: Payer: Self-pay | Admitting: Nurse Practitioner

## 2014-08-09 ENCOUNTER — Telehealth: Payer: Self-pay | Admitting: Nurse Practitioner

## 2014-08-09 MED ORDER — NORTRIPTYLINE HCL 50 MG PO CAPS: ORAL_CAPSULE | ORAL | Status: DC

## 2014-08-09 NOTE — Telephone Encounter (Signed)
Patient notified

## 2014-08-09 NOTE — Telephone Encounter (Signed)
Sent in new Rx. Recheck in 3 months.

## 2014-08-09 NOTE — Telephone Encounter (Signed)
Patient said that he is taking the 100 mg nortriptyline (he said he was told to increase weekly). So now that he has reached 100 mg, he needs a [facility] the [facility]

## 2014-08-17 IMAGING — CR DG ABDOMEN ACUTE W/ 1V CHEST
4 series · 4 of 4 positions shown · non-contrast
Comparison: DG ABDOMEN 1V dated 03/17/2013

CLINICAL DATA: Right flank pain with hematuria, nausea and
vomiting.

EXAM:
ACUTE ABDOMEN SERIES (ABDOMEN 2 VIEW & CHEST 1 VIEW)

[view not recorded (1 of 4)]
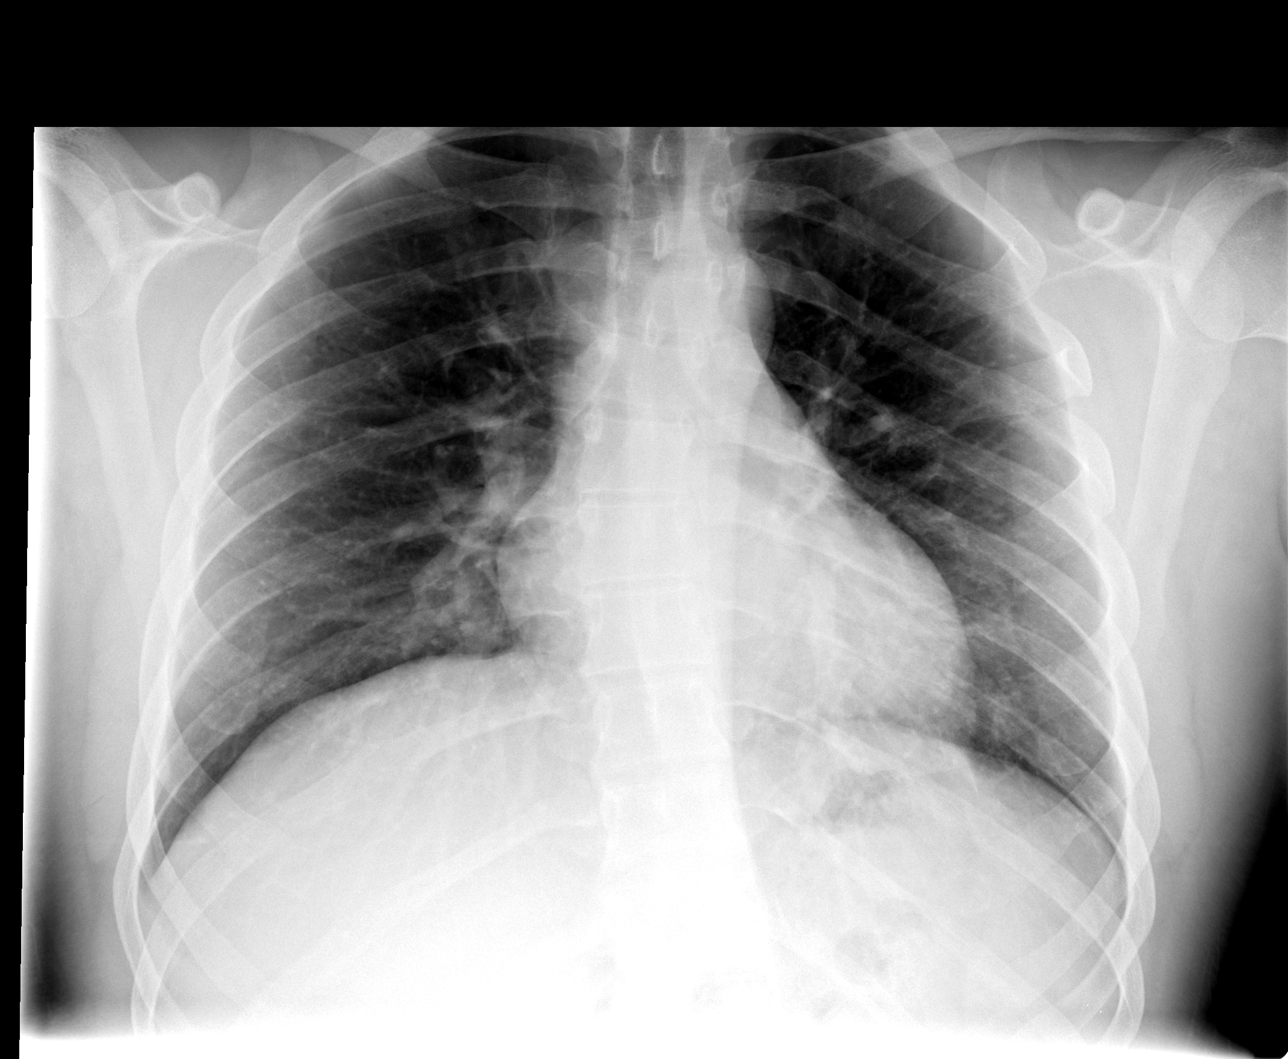

[view not recorded (2 of 4)]
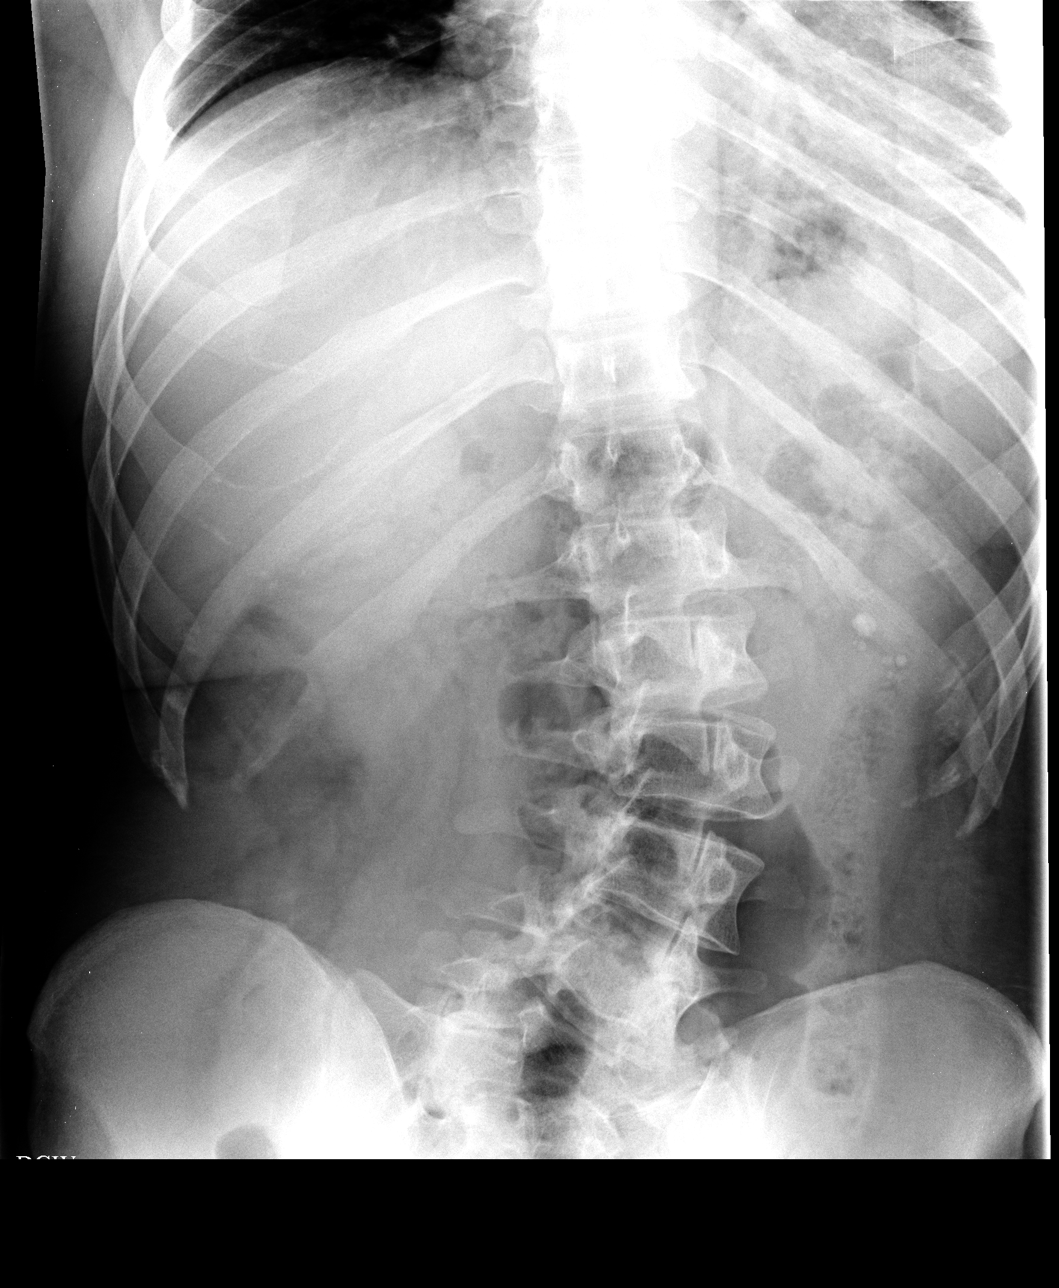

[view not recorded (3 of 4)]
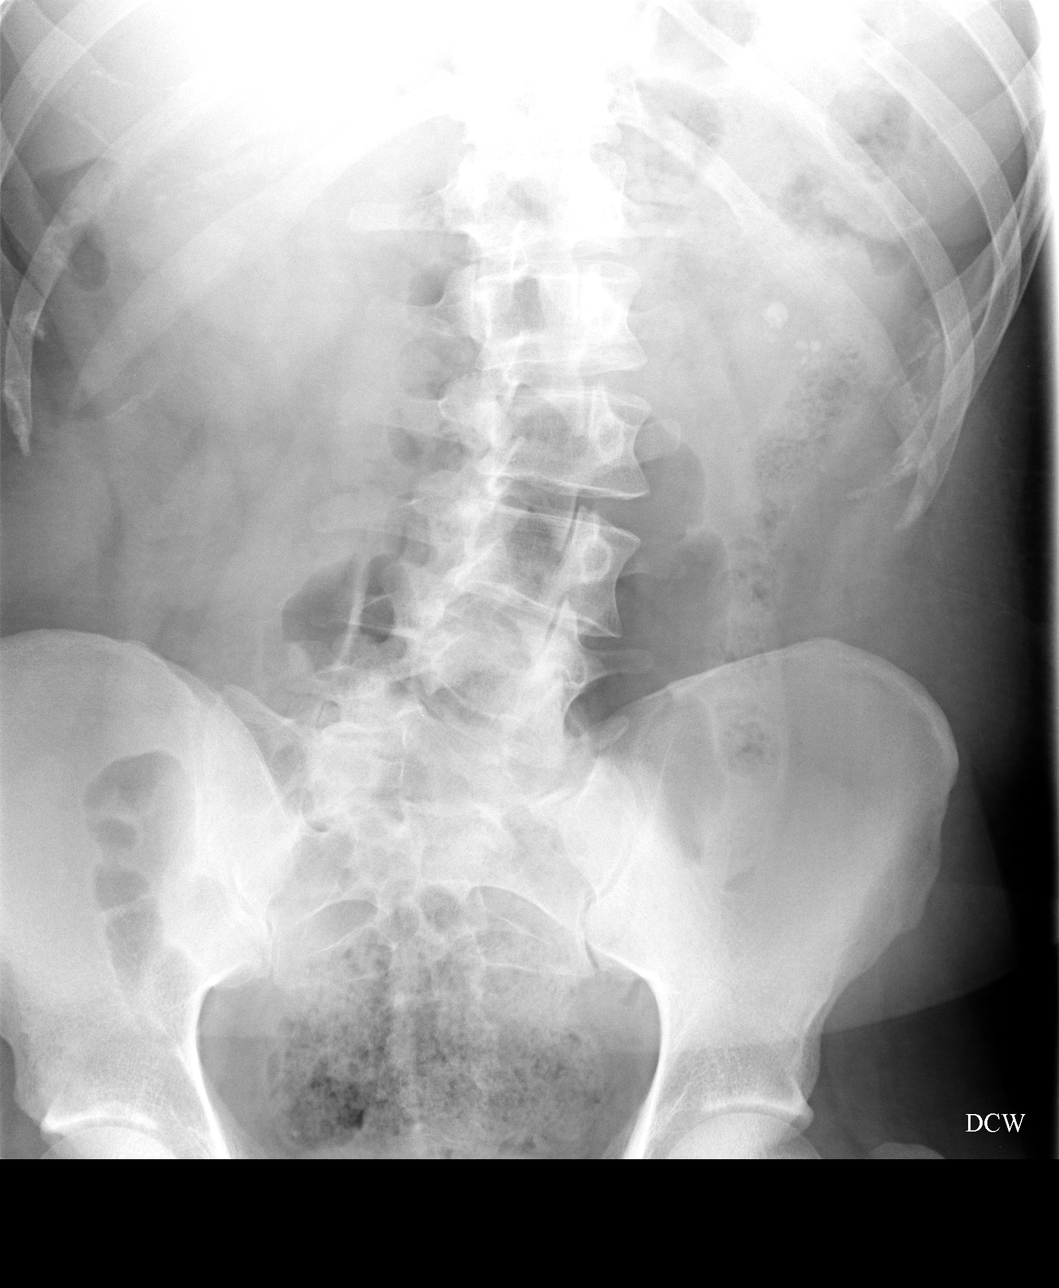

[view not recorded (4 of 4)]
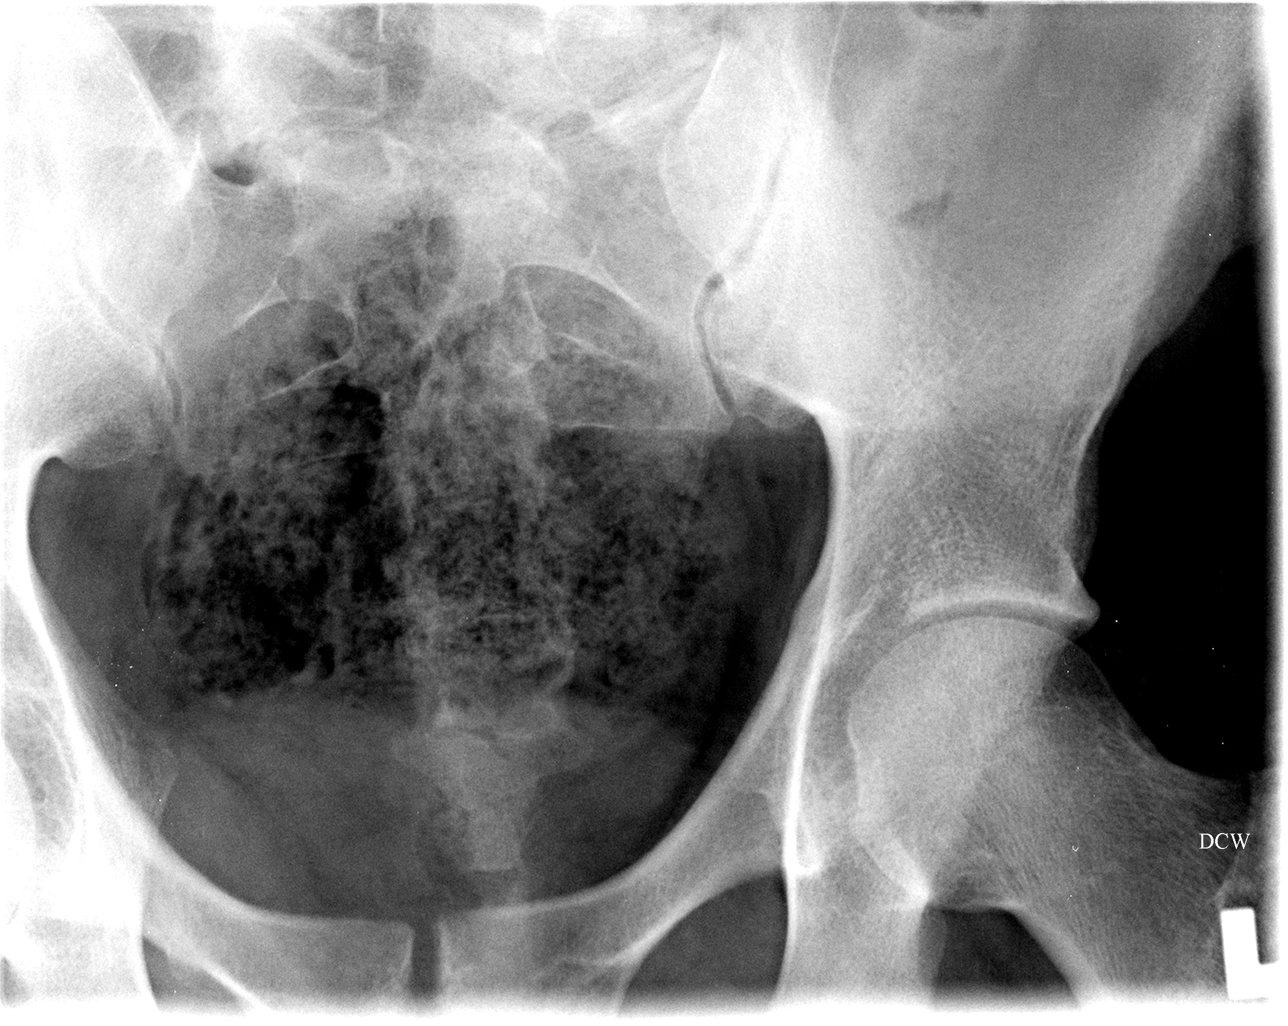

[4 of 4 positions shown; findings below may reference images not displayed]

FINDINGS: Cardiomediastinal silhouette is unremarkable. Lungs are clear, no
pleural effusions. No pneumothorax. Soft tissue planes and included
osseous structures are unremarkable. Thoracic dextroscoliosis.

Bowel gas pattern is nondilated and nonobstructive. Moderate amount
of stool projects of the rectal vault, unchanged. At least 6
radiopaque calculi project in the left kidney, largest measuring 8
mm, with probable at least 1-2 new stones. No intra-abdominal mass
effect, or free air. Soft tissue planes and included osseous
structures are nonsuspicious. Lumbar levoscoliosis.
IMPRESSION: No acute cardiopulmonary process.

At least 6 left renal calculi, at least 1 of which appears new.

Moderate amount of stool projecting at the rectal vault without
radiographic findings of bowel obstruction.

  By: Tuovi Paldanius

## 2014-08-30 ENCOUNTER — Ambulatory Visit: Payer: BC Managed Care – PPO | Admitting: Nurse Practitioner

## 2014-09-02 ENCOUNTER — Other Ambulatory Visit: Payer: Self-pay | Admitting: Nurse Practitioner

## 2014-09-02 MED ORDER — RIZATRIPTAN BENZOATE 10 MG PO TABS: ORAL_TABLET | ORAL | Status: DC

## 2014-09-06 ENCOUNTER — Ambulatory Visit (INDEPENDENT_AMBULATORY_CARE_PROVIDER_SITE_OTHER): Payer: BC Managed Care – PPO | Admitting: Nurse Practitioner

## 2014-09-06 ENCOUNTER — Encounter: Payer: Self-pay | Admitting: Nurse Practitioner

## 2014-09-06 VITALS — BP 118/86 | Ht 69.0 in | Wt 222.0 lb

## 2014-09-06 DIAGNOSIS — G43009 Migraine without aura, not intractable, without status migrainosus: Secondary | ICD-10-CM

## 2014-09-06 MED ORDER — PROMETHAZINE HCL 25 MG PO TABS: 25.0000 mg | ORAL_TABLET | Freq: Four times a day (QID) | ORAL | Status: DC | PRN

## 2014-09-06 MED ORDER — NORTRIPTYLINE HCL 50 MG PO CAPS: ORAL_CAPSULE | ORAL | Status: DC

## 2014-09-06 MED ORDER — PHENTERMINE HCL 37.5 MG PO TABS: 37.5000 mg | ORAL_TABLET | Freq: Every day | ORAL | Status: DC

## 2014-09-06 MED ORDER — RIZATRIPTAN BENZOATE 10 MG PO TABS: ORAL_TABLET | ORAL | Status: DC

## 2014-09-09 ENCOUNTER — Encounter: Payer: Self-pay | Admitting: Nurse Practitioner

## 2014-09-09 NOTE — Progress Notes (Signed)
Subjective:  Presents for recheck on his migraines. Averaging about 1 per week, stops with use of Maxalt. Is going to school and working. Has not been exercising like he did before. Would like to restart phentermine to help some with his weight loss. Has taken this without difficulty in the past.  Objective:   BP 118/86  Ht  (1.753 m)  Wt 222 lb (100.699 kg)  BMI 32.77 kg/m2 NAD. Alert, oriented. Lungs clear. Heart regular rate rhythm.  Assessment:  Problem List Items Addressed This Visit     Cardiovascular and Mediastinum   Migraine headache without aura - Primary   Relevant Medications      rizatriptan (MAXALT) tablet      nortriptyline (PAMELOR) capsule    Other Visit Diagnoses   Morbid obesity        Relevant Medications       phentermine (ADIPEX-P) 37.5 MG tablet       Plan:  Meds ordered this encounter  Medications  . rizatriptan (MAXALT) 10 MG tablet    Sig: One po at onset of migraine; may repeat once in 2 hours if needed; max 2 per 24 hours    Dispense:  10 tablet    Refill:  5    Order Specific Question:  Supervising Provider    Answer:  Merlyn Albert [[id number]]  . nortriptyline (PAMELOR) 50 MG capsule    Sig: Take 2 po at bedtime    Dispense:  60 capsule    Refill:  5    Order Specific Question:  Supervising Provider    Answer:  Merlyn Albert [[id number]]  . phentermine (ADIPEX-P) 37.5 MG tablet    Sig: Take 1 tablet (37.5 mg total) by mouth daily before breakfast.    Dispense:  30 tablet    Refill:  2    Order Specific Question:  Supervising Provider    Answer:  Merlyn Albert [[id number]]  . promethazine (PHENERGAN) 25 MG tablet    Sig: Take 1 tablet (25 mg total) by mouth every 6 (six) hours as needed.    Dispense:  30 tablet    Refill:  0    Order Specific Question:  Supervising Provider    Answer:  Merlyn Albert [[id number]]   Discussed importance of exercise not only for weight loss but for stress reduction. Restart phentermine as directed. Continue  other medications as directed. Return in about 6 months (around 03/07/2015). Call back sooner if any problems. Needs to be seen in 3 months if he wishes to continue phentermine.

## 2014-09-17 ENCOUNTER — Encounter: Payer: Self-pay | Admitting: Family Medicine

## 2014-09-17 ENCOUNTER — Ambulatory Visit (INDEPENDENT_AMBULATORY_CARE_PROVIDER_SITE_OTHER): Payer: BC Managed Care – PPO | Admitting: Family Medicine

## 2014-09-17 VITALS — BP 122/84 | HR 80 | Temp 98.2°F | Resp 14 | Ht 67.25 in | Wt 217.0 lb

## 2014-09-17 DIAGNOSIS — Z0000 Encounter for general adult medical examination without abnormal findings: Secondary | ICD-10-CM

## 2014-09-17 DIAGNOSIS — Z0189 Encounter for other specified special examinations: Secondary | ICD-10-CM

## 2014-09-17 NOTE — Progress Notes (Signed)
   Subjective:    Patient ID: Thomas Howell, male    DOB: [birth year], 23 y.o.   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 Already had flu vaccine.   Occasional migraine headaches otherwise no significant difficulty.  Decent diet though not perfect. Next  Unfortunately not exercising at this time.  Review of Systems  Constitutional: Negative for fever, activity change and appetite change.  HENT: Negative for congestion and rhinorrhea.   Eyes: Negative for discharge.  Respiratory: Negative for cough and wheezing.   Cardiovascular: Negative for chest pain.  Gastrointestinal: Negative for vomiting, abdominal pain and blood in stool.  Genitourinary: Negative for frequency and difficulty urinating.  Musculoskeletal: Negative for neck pain.  Skin: Negative for rash.  Allergic/Immunologic: Negative for environmental allergies and food allergies.  Neurological: Negative for weakness and headaches.  Psychiatric/Behavioral: Negative for agitation.  All other systems reviewed and are negative.      Objective:   Physical Exam  Vitals reviewed. Constitutional: He appears well-developed and well-nourished.  HENT:  Head: Normocephalic and atraumatic.  Right Ear: External ear normal.  Left Ear: External ear normal.  Nose: Nose normal.  Mouth/Throat: Oropharynx is clear and moist.  Eyes: EOM are normal. Pupils are equal, round, and reactive to light.  Neck: Normal range of motion. Neck supple. No thyromegaly present.  Cardiovascular: Normal rate, regular rhythm and normal heart sounds.   No murmur heard. Pulmonary/Chest: Effort normal and breath sounds normal. No respiratory distress. He has no wheezes.  Abdominal: Soft. Bowel sounds are normal. He exhibits no distension and no mass. There is no tenderness.  Genitourinary: Penis normal.  Musculoskeletal: Normal range of motion. He exhibits no edema.  Lymphadenopathy:    He has no cervical adenopathy.  Neurological: He is  alert. He exhibits normal muscle tone.  Skin: Skin is warm and dry. No erythema.  Psychiatric: He has a normal mood and affect. His behavior is normal. Judgment normal.          Assessment & Plan:   Impression #1 wellness exam #2 migraines clinically stable plan patient needs varicella titer. School forms filled out. PPD and TD a at the health Department. WSL

## 2014-09-18 LAB — VARICELLA ZOSTER ANTIBODY, IGG: Varicella IgG: 725.9 Index — ABNORMAL HIGH (ref <135.00)

## 2014-09-24 ENCOUNTER — Telehealth: Payer: Self-pay | Admitting: Family Medicine

## 2014-09-24 MED ORDER — AZITHROMYCIN 250 MG PO TABS: ORAL_TABLET | ORAL | Status: DC

## 2014-09-24 NOTE — Telephone Encounter (Signed)
zpk if not allergic 

## 2014-09-24 NOTE — Telephone Encounter (Signed)
Patient is in clinicals for college right now. Mom said that he is very prone to getting strep throat and that Dr. Brett CanalesSteve has called something in for him at times. He has a sore throat and chills. Mom wants to know if we can call something in.   [facility] [address]

## 2014-09-24 NOTE — Telephone Encounter (Signed)
Left message on voicemail notifying mom that medication has been sent to pharmacy.  

## 2014-12-23 ENCOUNTER — Telehealth: Payer: Self-pay | Admitting: Family Medicine

## 2014-12-23 NOTE — Telephone Encounter (Signed)
Pt called, requested referral to current urologist due to change in insurance, patient states they can see him tomorrow but needs referral from [place]   Referral auth# [id number], good for 6 visits until 06/23/15 through [facility], faxed referral auth to [facility] - Dr. Mena Goes

## 2014-12-24 ENCOUNTER — Other Ambulatory Visit: Payer: Self-pay | Admitting: Urology

## 2014-12-24 ENCOUNTER — Ambulatory Visit: Payer: BC Managed Care – PPO | Admitting: Family Medicine

## 2014-12-24 IMAGING — CT CT ABD-PELV W/O CM
3 of 4 series · 10 of 46 positions shown, 17 images · non-contrast
Comparison: 09/13/2011

CLINICAL DATA: Left-sided flank pain.  Urolithiasis.

EXAM:
CT ABDOMEN AND PELVIS WITHOUT CONTRAST
TECHNIQUE: Multidetector CT imaging of the abdomen and pelvis was performed
following the standard protocol without IV contrast.

[Series 3: mpr coronal (id) · coronal · 0.87mm/px · 3 of 87 slices shown, 4 images]
[im 29/87  soft-tissue]
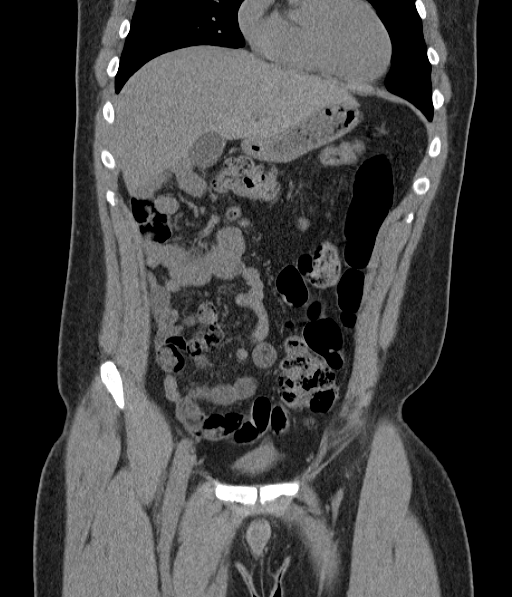
[im 39/87  soft-tissue]
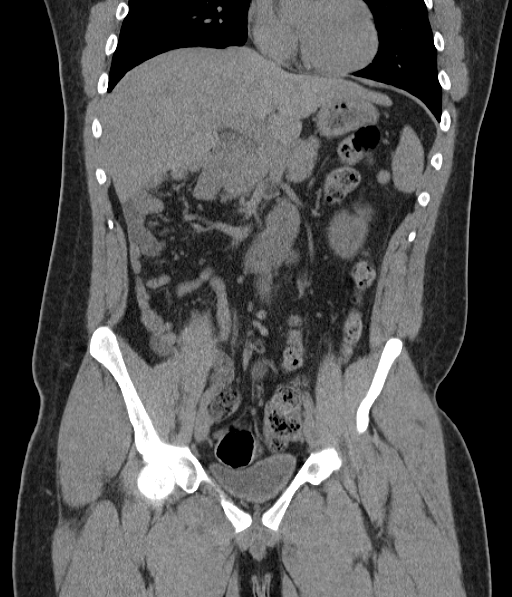
[im 39/87  bone]
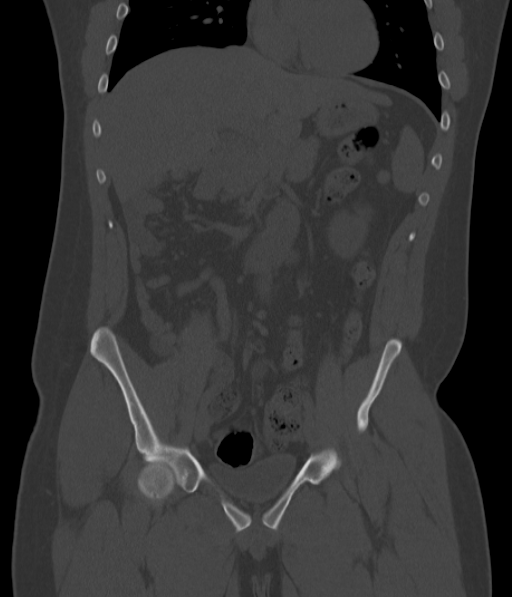
[im 48/87  soft-tissue]
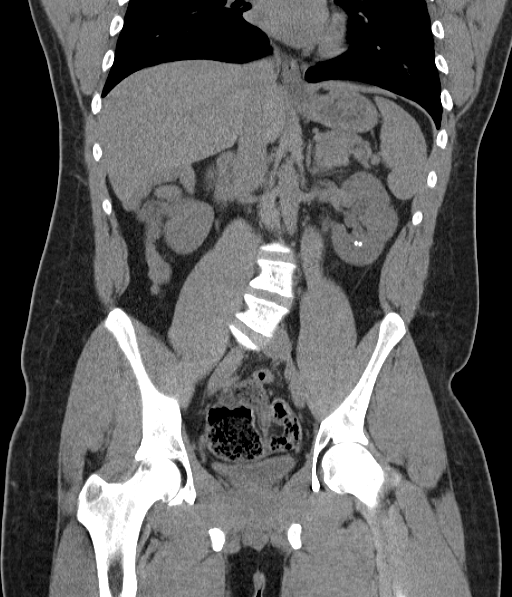

[Series 4: mpr sagittal (id) · sagittal · 0.60mm/px · 1 of 131 slices shown, 2 images]
[im 44/131  soft-tissue]
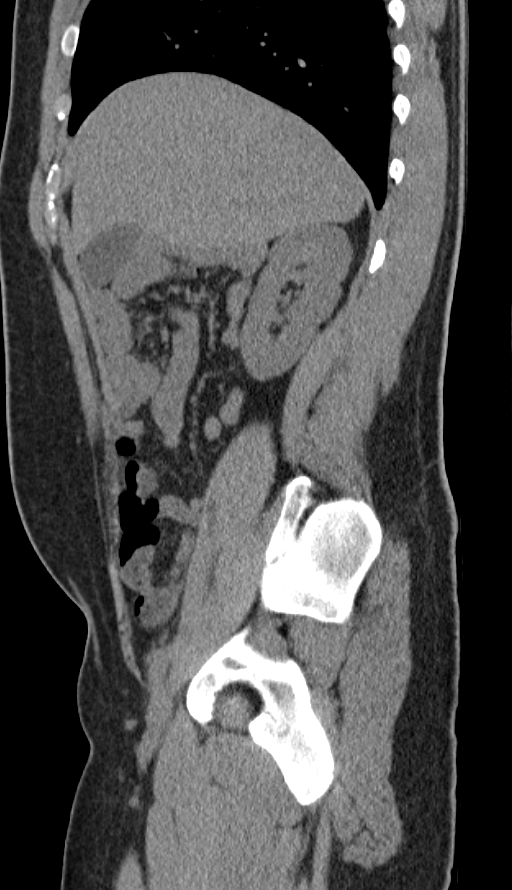
[im 44/131  bone]
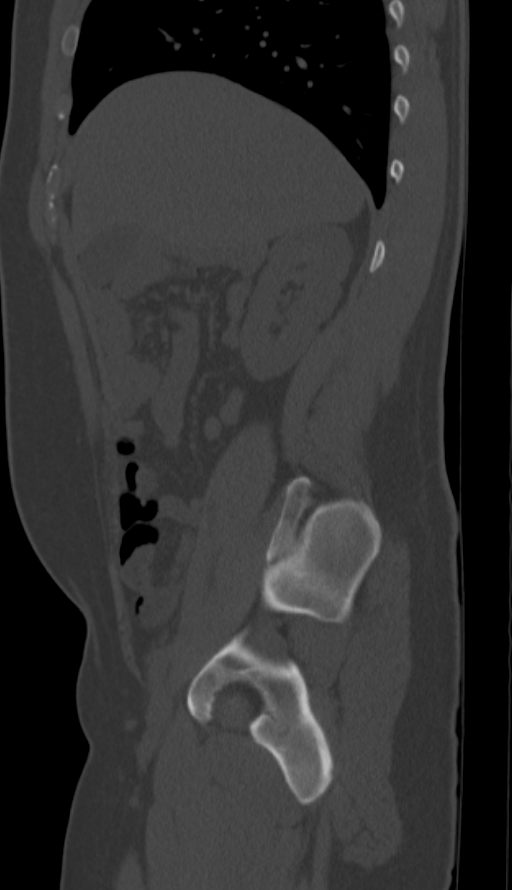

[Series 6: lung 5.0 b60f · axial · 0.87mm/px · z∈[-98,+22]mm · 6 of 34 slices shown, 11 images]
[im 5/34  soft-tissue]
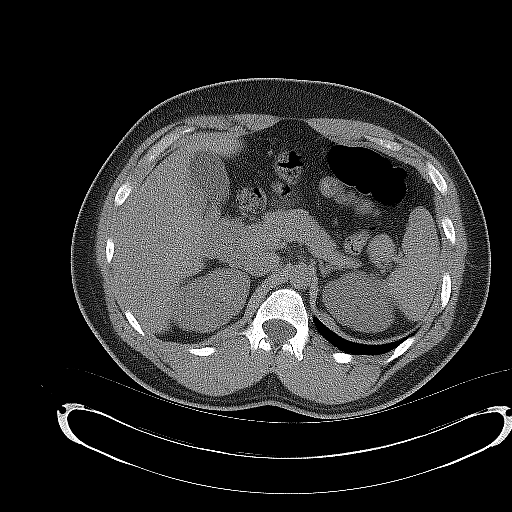
[im 5/34  bone]
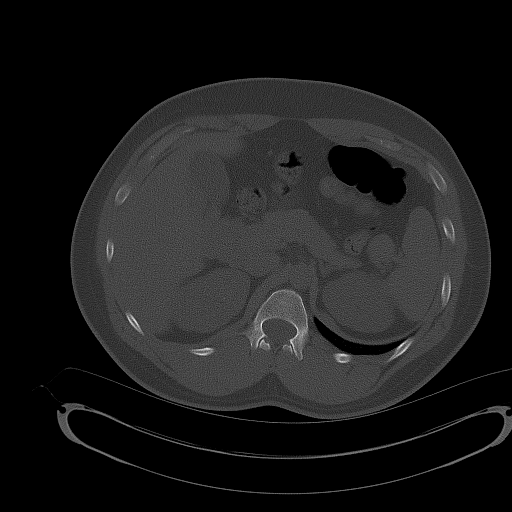
[im 10/34  soft-tissue]
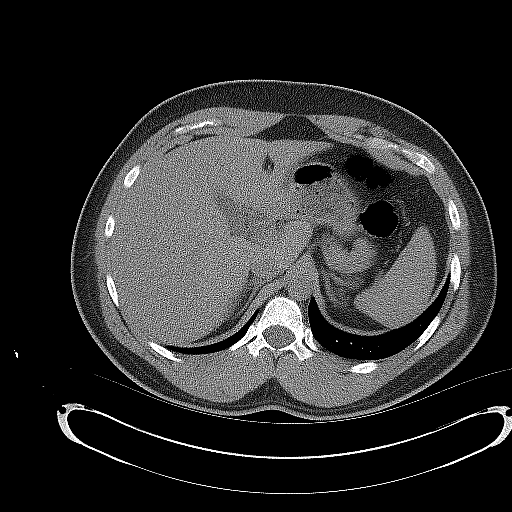
[im 15/34  soft-tissue]
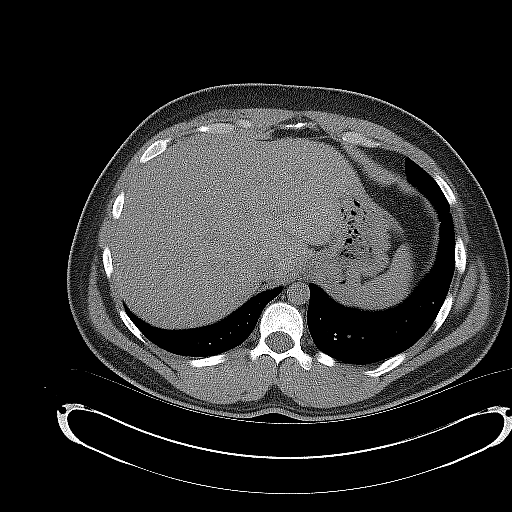
[im 15/34  lung]
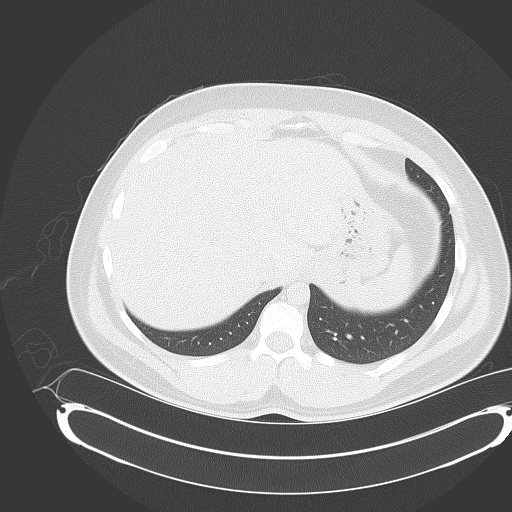
[im 19/34  soft-tissue]
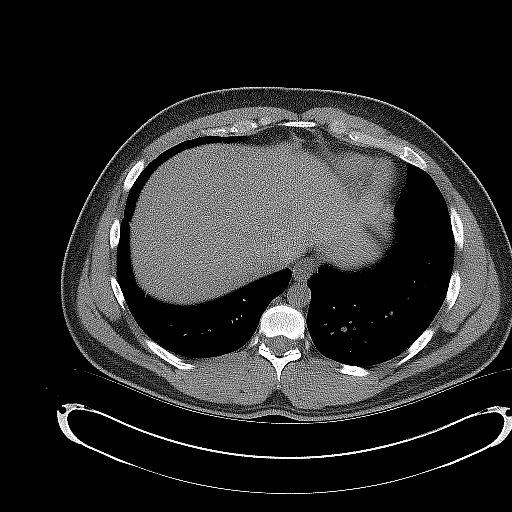
[im 19/34  lung]
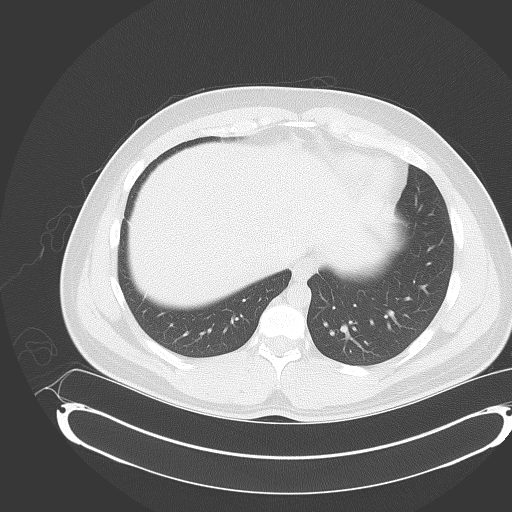
[im 24/34  soft-tissue]
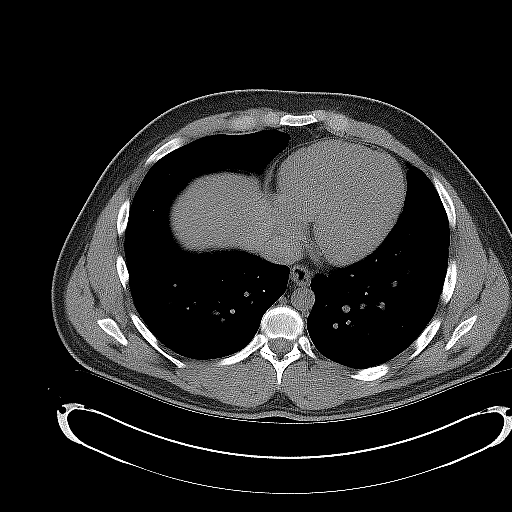
[im 24/34  lung]
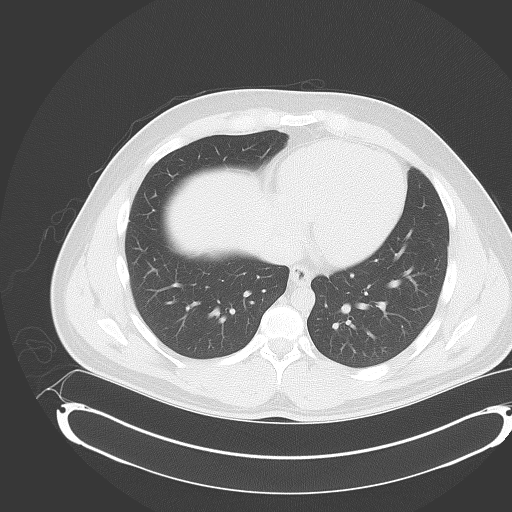
[im 29/34  soft-tissue]
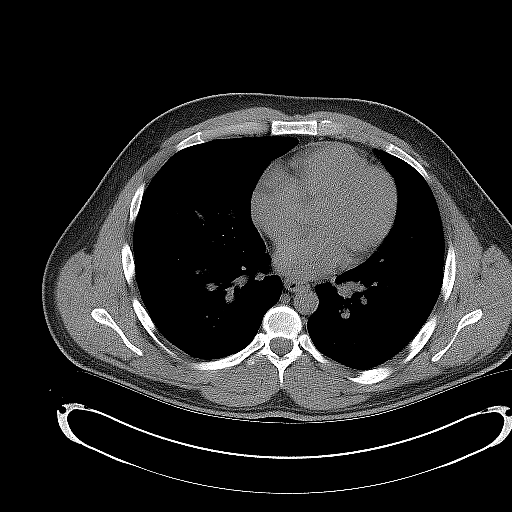
[im 29/34  lung]
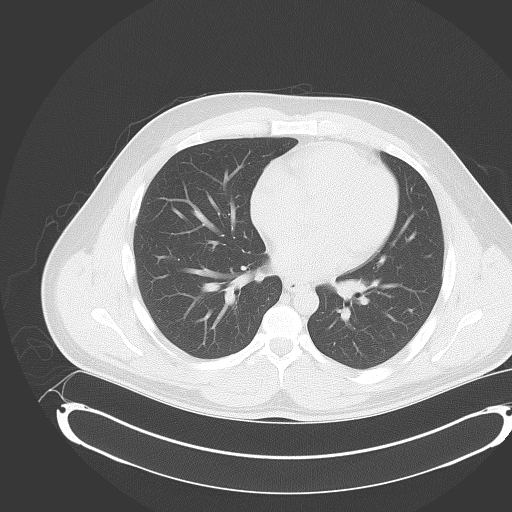

[10 of 46 positions shown; findings below may reference images not displayed]

FINDINGS: A 4 mm proximal left ureteral calculus is seen just above the level
of the iliac crest. This causes mild left hydronephrosis. Multiple
small less than 1 cm intrarenal calculi are noted bilaterally, left
side greater than right. No evidence of right-sided hydronephrosis
or ureteral calculi.

The other abdominal parenchymal organs are unremarkable in
appearance on this noncontrast study. No soft tissue masses or
lymphadenopathy identified. No evidence of inflammatory process or
abnormal fluid collections. No evidence of bowel wall thickening or
dilatation. Small bowel is located in the right abdomen with the
colon and cecum in the left abdomen and pelvis, consistent with
congenital malrotation of bowel.
IMPRESSION: 4 mm proximal left ureteral calculus causing mild left
hydronephrosis.

Bilateral nonobstructive nephrolithiasis.

Incidentally noted congenital malrotation of bowel. No evidence of
bowel obstruction.

## 2014-12-25 ENCOUNTER — Encounter (HOSPITAL_COMMUNITY): Payer: Self-pay | Admitting: *Deleted

## 2014-12-25 NOTE — H&P (Signed)
Reason For Visit Seen today for an ER f/u.   Active Problems Problems  1. Calculus of left ureter (N20.1)   Assessed By: Jimmey Ralph (Urology); Last Assessed: 24 Dec 2014 2. Microscopic hematuria (R31.2)   Assessed By: Jimmey Ralph (Urology); Last Assessed: 24 Dec 2014 3. Nephrolithiasis (N20.0)   Assessed By: Jimmey Ralph (Urology); Last Assessed: 24 Dec 2014  History of Present Illness [name] seen today for an ER f/u. Seen in the ER at [place], [place] 12/23/14.    GU Hx:    1-nephrolithiasis- patient passed stones in 2012 and they were calcium phosphate. PTH and calcium were normal. Urine pH usually 6-7.   -Jul 2015 - He passed several stones several years ago. He's never had surgery for kidney stones. 06/17/14 CT: Left mid ureter stone. Multiple left kidney stones. LLP 15m, 136m 43m12m43mm100m LMP 8 mm  LUP 4 mm   06/21/14: KUB shows two left ureteral stones and multiple left kidney stones.      September 2015 interval history  July 2015 patient underwent a staged left ureteroscopy. I did the procedure. This revealed a multichannel left lower pole diverticulum with multiple stones. I tried to open up and empty as many stones as I could. Stones are carbonate apatite 90%, calcium oxalate 10%    Patient had a string and pulled the stent. Today he is well without flank pain, further stone passage and no gross hematuria.    The renal ultrasound showed no significant dilation. A remaining 4 mm left lower pole stone.    Jan 2016 Interval Hx:   Today states that pain is 5/10 and moderately controlled with PRN Oxycodone. Was not started on alpha blocker. Denies f/c or n/v.   Surgical History Problems  1. History of Cystoscopy With Insertion Of Ureteral Stent Left 2. History of Cystoscopy With Insertion Of Ureteral Stent Left 3. History of Cystoscopy With Ureteroscopy With Lithotripsy 4. History of Foot Surgery 5. History of  Lymphadenectomy  Current Meds 1. Naproxen 500 MG Oral Tablet;  Therapy: [id number] 2. Nortriptyline HCl - 25 MG Oral Capsule;  Therapy: [id number] 3. Percocet 5-325 MG Oral Tablet;  Therapy: (Recorded:05Jan2016) to Recorded 4. Phenergan TABS;  Therapy: (Recorded:06Jul2015) to Recorded  Allergies Medication  1. Codeine Derivatives 2. Hydrocodone-Acetaminophen CAPS  Family History Problems  1. Family history of Blood In Urine : Mother 2. Family history of Blood In Urine : Brother 3. Family history of Family Health Status Number Of Children   1 brother 4. Family history of Nephrolithiasis : Mother, Brother  Social History Problems  1. Alcohol Use   social 2. Caffeine Use   3 glasses a day 3. Current smoker on some days (F17.210)   social 4. Marital History - Single 5. Occupation:   studShip brokerDenied: History of Tobacco Use  Review of Systems Genitourinary, constitutional, skin, eye, otolaryngeal, hematologic/lymphatic, cardiovascular, pulmonary, endocrine, musculoskeletal, gastrointestinal, neurological and psychiatric system(s) were reviewed and pertinent findings if present are noted and are otherwise negative.  Gastrointestinal: flank pain.    Vitals Vital Signs [Data Includes: Last 1 Day]  Recorded: [id number]  Blood Pressure: 152 / 103 Temperature: 97.7 F Heart Rate: 97  Physical Exam Constitutional: Well nourished and well developed . No acute distress. The patient appears well hydrated.  ENT:. The ears and nose are normal in appearance. The oropharynx is normal. Mallimpati - class II.  Abdomen: The abdomen is mildly obese.  Moderate tenderness in the LLQ is present, but no tenderness in the RLQ. No right CVA tenderness and moderate left CVA tenderness.  Skin: Normal skin turgor.  Neuro/Psych:. Mood and affect are appropriate.    Results/Data Urine [Data Includes: Last 1 Day]   [id number]  COLOR AMBER   APPEARANCE CLOUDY    SPECIFIC GRAVITY 1.025   pH 5.0   GLUCOSE NEG mg/dL  BILIRUBIN SMALL   KETONE NEG mg/dL  BLOOD LARGE   PROTEIN TRACE mg/dL  UROBILINOGEN 0.2 mg/dL  NITRITE NEG   LEUKOCYTE ESTERASE NEG   SQUAMOUS EPITHELIAL/HPF NONE SEEN   WBC 0-2 WBC/hpf  RBC TNTC RBC/hpf  BACTERIA FEW   CRYSTALS NONE SEEN   CASTS NONE SEEN   Other MUCUS NOTED    The following images/tracing/specimen were independently visualized:  KUB: shows large amount of stone burden left lower pole. There are at least 1-2 stones noted at left mid ureter. This KUB was also reviewed by Dr. Junious Silk.  The following clinical lab reports were reviewed:  UA- significant microscopic hematuria pH: 5.0.  The following radiology reports were reviewed: Reviewed CT urogram from 12/23/14. Shows bilateral renal calculi and mild left hydronephrosis secondary to 6 mm proximal ureteral stone.    Assessment Assessed  1. Microscopic hematuria (R31.2) 2. Nephrolithiasis (N20.0) 3. Calculus of left ureter (N20.1)  Plan  Calculus of left ureter  1. Start: HYDROmorphone HCl - 2 MG Oral Tablet; TAKE 1 TABLET EVERY 4 TO 6 HOURS  AS NEEDED FOR PAIN 2. Start: Tamsulosin HCl - 0.4 MG Oral Capsule; TAKE 1 CAPSULE Daily Unlinked  3. Stop: Naproxen 500 MG Oral Tablet  Discussed MET. Pt is demanding something be done today because he starts nursing school in 6 days and can not afford to miss class.  Will have pt him begin Tamsulosin 0.4 mg 1 po daily #30/0RF  Hydromorphone 2 mg 1 po Q4-6 hrs prn #20/0RF  Will proceed with ESWL on 12/25/14 with Dr. Junious Silk. Risk and benefits discussed of: renal hematoma, possible need for second procedure either ESWL or cystourethroscopy, L RPG, stone extraction, and double J stent placement. Voices understanding. Wishes to proceed.        KUB; Status:Resulted - Requires Verification;  Done: 01EOF1219 12:00AM  [id number]; Marked Important;Ordered; Today;   EBR:AXENMMHW of left ureter, Nephrolithiasis;  Ordered KG:SUPJSR, Diane;   Electronics engineer signed by : Jimmey Ralph, Trey Paula; Dec 24 2014  3:59PM EST  Attending attestation: I reviewed the ED notes and discussed patient with NP Diane. I reviewed KUB image and compared to prior KUB and CT images and the calcification in the left ureter on KUB appears to be the one described on the CT.

## 2014-12-25 NOTE — Progress Notes (Signed)
Notified Lossie FaesPam Gibson with [facility] Dr. Mena GoesEskridge needs to sign orders.

## 2014-12-26 ENCOUNTER — Encounter (HOSPITAL_COMMUNITY): Payer: Self-pay | Admitting: General Practice

## 2014-12-26 ENCOUNTER — Ambulatory Visit (HOSPITAL_COMMUNITY)
Admission: RE | Admit: 2014-12-26 | Discharge: 2014-12-26 | Disposition: A | Discharge status: Home / Self Care | Payer: 59 | Source: Ambulatory Visit | Attending: Urology | Admitting: Urology

## 2014-12-26 ENCOUNTER — Ambulatory Visit (HOSPITAL_COMMUNITY): Payer: 59

## 2014-12-26 ENCOUNTER — Encounter (HOSPITAL_COMMUNITY)
Admission: RE | Disposition: A | Discharge status: Home / Self Care | Payer: Self-pay | Source: Ambulatory Visit | Attending: Urology

## 2014-12-26 DIAGNOSIS — Z91048 Other nonmedicinal substance allergy status: Secondary | ICD-10-CM | POA: Diagnosis not present

## 2014-12-26 DIAGNOSIS — Z87442 Personal history of urinary calculi: Secondary | ICD-10-CM | POA: Insufficient documentation

## 2014-12-26 DIAGNOSIS — N201 Calculus of ureter: Secondary | ICD-10-CM

## 2014-12-26 DIAGNOSIS — Z888 Allergy status to other drugs, medicaments and biological substances status: Secondary | ICD-10-CM | POA: Insufficient documentation

## 2014-12-26 DIAGNOSIS — F17210 Nicotine dependence, cigarettes, uncomplicated: Secondary | ICD-10-CM | POA: Insufficient documentation

## 2014-12-26 DIAGNOSIS — G43909 Migraine, unspecified, not intractable, without status migrainosus: Secondary | ICD-10-CM | POA: Insufficient documentation

## 2014-12-26 DIAGNOSIS — R312 Other microscopic hematuria: Secondary | ICD-10-CM | POA: Diagnosis not present

## 2014-12-26 SURGERY — LITHOTRIPSY, ESWL
Anesthesia: LOCAL | Laterality: Left

## 2014-12-26 MED ORDER — DIAZEPAM 5 MG PO TABS
10.0000 mg | ORAL_TABLET | ORAL | Status: AC
  Administered 2014-12-26: 10 mg via ORAL
  Filled 2014-12-26: qty 2

## 2014-12-26 MED ORDER — AZITHROMYCIN 250 MG PO TABS: ORAL_TABLET | ORAL | Status: DC

## 2014-12-26 MED ORDER — OXYCODONE-ACETAMINOPHEN 5-325 MG PO TABS: 1.0000 | ORAL_TABLET | ORAL | Status: DC | PRN

## 2014-12-26 MED ORDER — PROMETHAZINE HCL 12.5 MG PO TABS: 12.5000 mg | ORAL_TABLET | Freq: Four times a day (QID) | ORAL | Status: DC | PRN

## 2014-12-26 MED ORDER — TAMSULOSIN HCL 0.4 MG PO CAPS: 0.4000 mg | ORAL_CAPSULE | Freq: Every day | ORAL | Status: DC

## 2014-12-26 MED ORDER — SODIUM CHLORIDE 0.9 % IV SOLN
INTRAVENOUS | Status: DC
Start: 2014-12-26 — End: 2014-12-26
  Administered 2014-12-26: 08:00:00 via INTRAVENOUS

## 2014-12-26 MED ORDER — DIPHENHYDRAMINE HCL 25 MG PO CAPS
25.0000 mg | ORAL_CAPSULE | ORAL | Status: AC
  Administered 2014-12-26: 25 mg via ORAL
  Filled 2014-12-26: qty 1

## 2014-12-26 MED ORDER — CIPROFLOXACIN HCL 500 MG PO TABS
500.0000 mg | ORAL_TABLET | ORAL | Status: AC
  Administered 2014-12-26: 500 mg via ORAL
  Filled 2014-12-26: qty 1

## 2014-12-26 NOTE — Interval H&P Note (Signed)
History and Physical Interval Note:  12/26/2014 9:40 AM  Thomas Howell  has presented today for surgery, with the diagnosis of LEFT MID URETERAL STONE  The various methods of treatment have been discussed with the patient and family. After consideration of risks, benefits and other options for treatment, the patient has consented to  Procedure(s): LEFT EXTRACORPOREAL SHOCK WAVE LITHOTRIPSY (ESWL) (Left) as a surgical intervention .  The patient's history has been reviewed, patient examined, no change in status, stable for surgery.  I have reviewed the patient's chart and labs. Patient passed left ureteral stone. They asked about treating the LLP. Discussed anatomy and why larger more anterior stone not accessable with URS. If we trear LLP I would start on the largest LLP stone near the diverticulum which might be increasing in size and responsible for some of the stone passage. The more anterior LLp stone seems stable. I discussed with the patient, MOP/FOP the nature, potential benefits, risks and alternatives to left ESWL, including side effects of the proposed treatment, the likelihood of the patient achieving the goals of the procedure, and any potential problems that might occur during the procedure or recuperation. He starts school Monday, so I advised waiting for a school break and doing ESWL electively in the event stone fragments obstruct or he needs a staged procedure. All questions answered. Patient elects to proceed with LLP ESWL. Otherwise he has no fever or dysuria.    Joelle Roswell

## 2014-12-26 NOTE — Op Note (Signed)
Left Lower Pole stone Left Lower Pole ESWL  See scanned [facility] Op note  Pt passed the left ureteral stone. Left LP treated largest more medial leading stone.

## 2014-12-26 NOTE — Discharge Instructions (Signed)

## 2014-12-28 IMAGING — CR DG ABDOMEN 1V
2 series · 2 of 2 positions shown · non-contrast
Comparison: 02/08/2014

CLINICAL DATA: Kidney stones.

EXAM:
ABDOMEN - 1 VIEW

[view not recorded (1 of 2)]
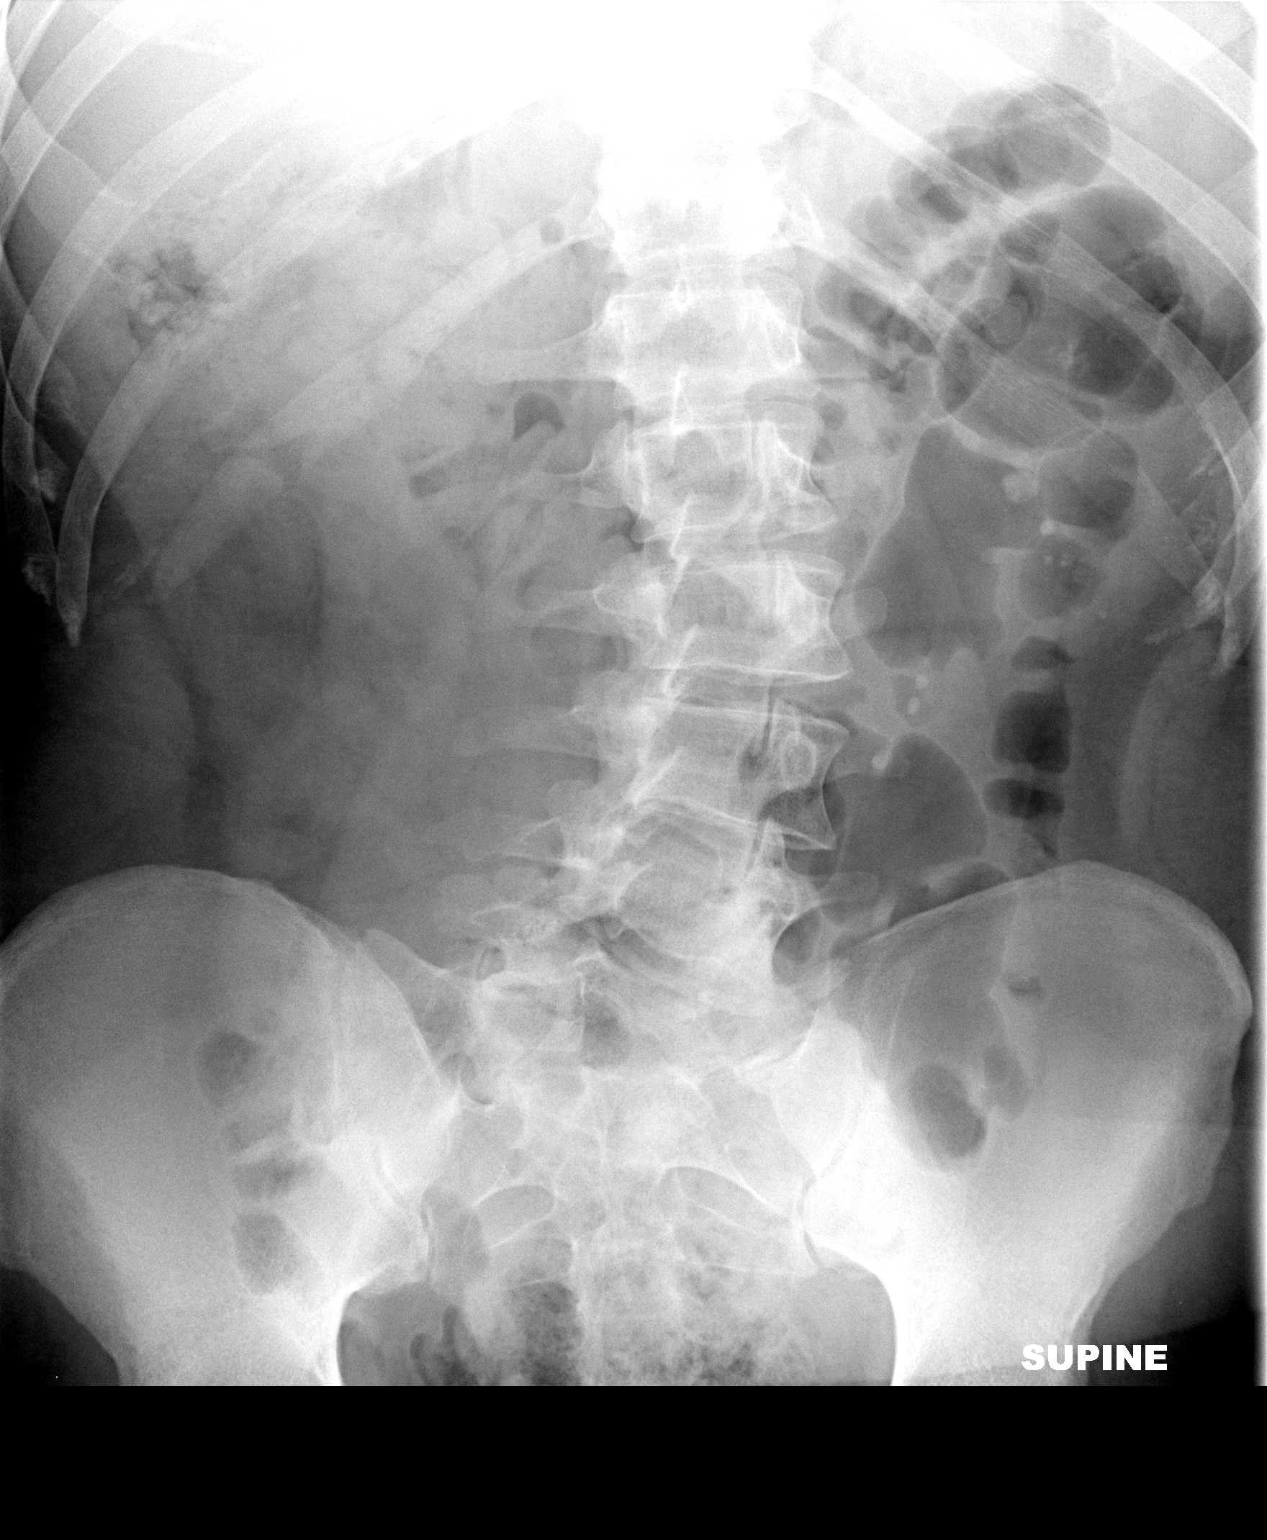

[view not recorded (2 of 2)]
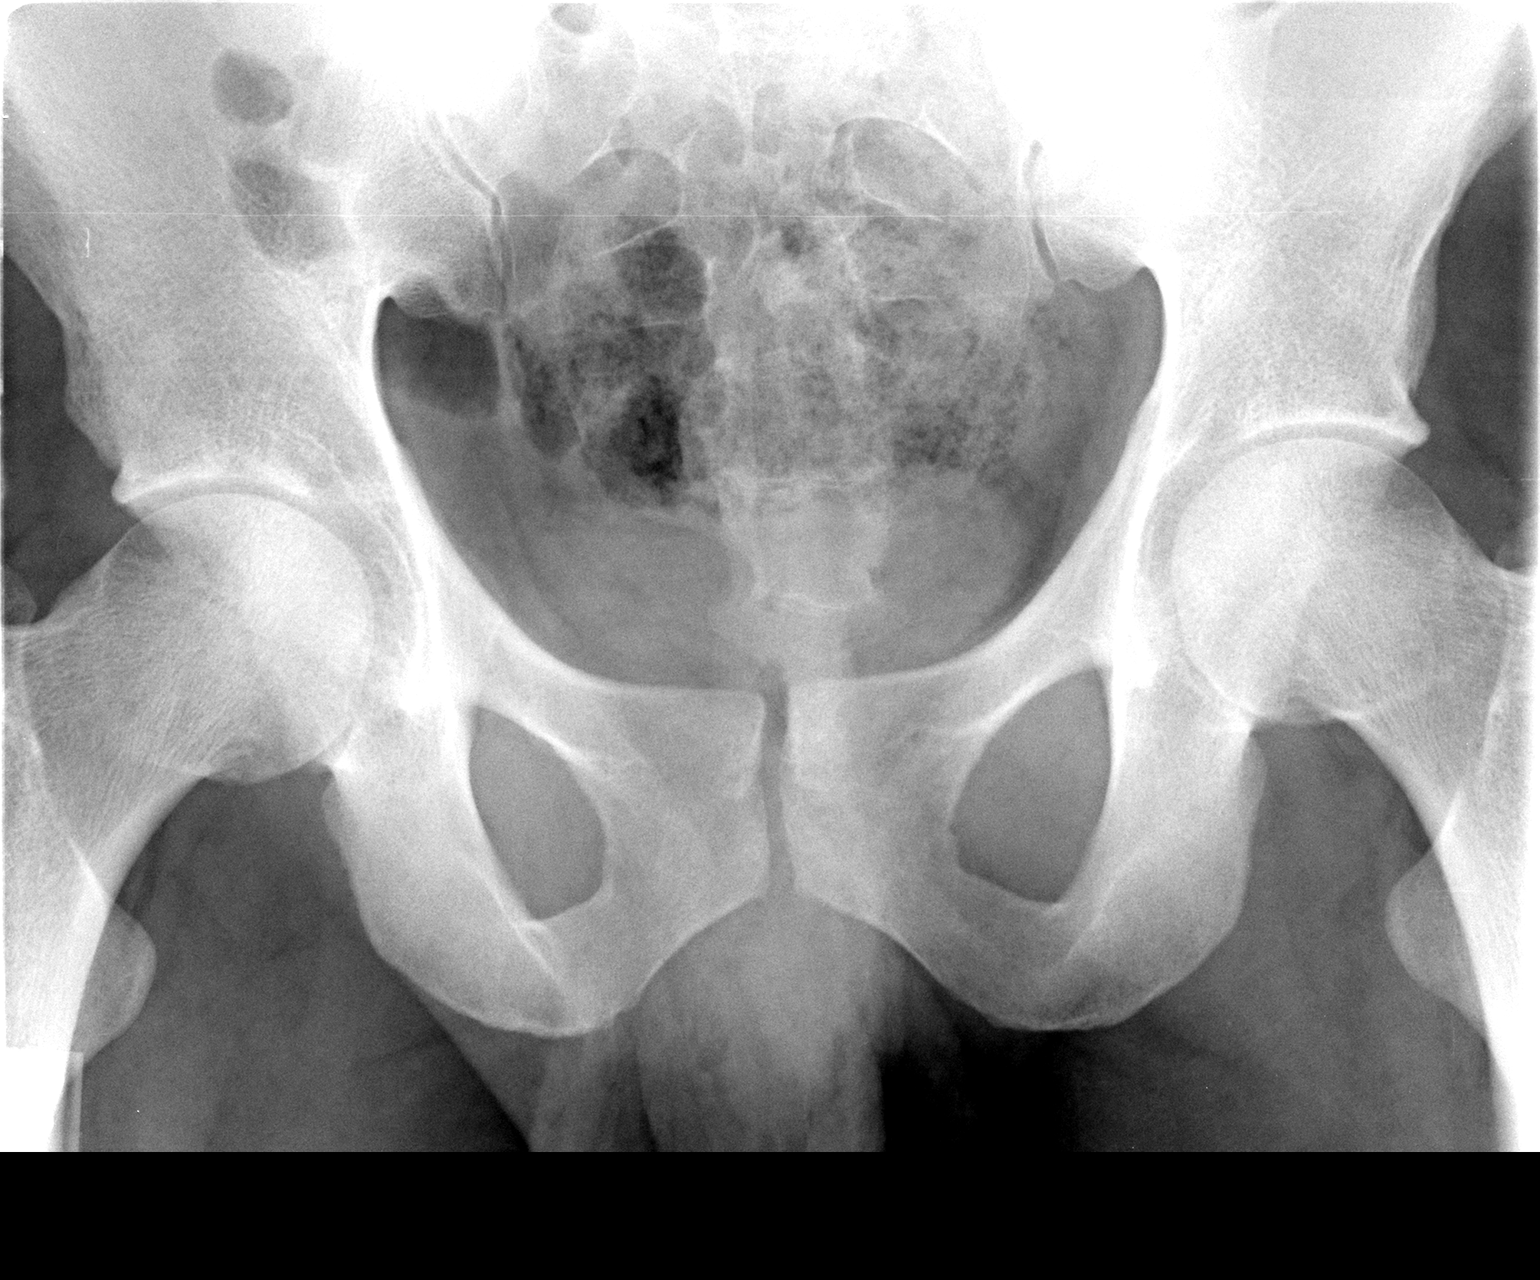

[2 of 2 positions shown; findings below may reference images not displayed]

FINDINGS: There are 2 calculi overlapping the left flank, at the level of the
L3-4 disc space, measuring 4 and 5 mm individually. Only 1 stone was
seen in this location on 06/17/2014 CT. Multiple other left-sided
urinary calculi, overlapping the renal shadow, measuring up to 11
mm. No right-sided nephrolithiasis visible by radiography.

The bowel gas pattern is nonobstructive. There is lumbar
levoscoliosis incidentally noted.
IMPRESSION: 1. Two urinary calculi, 5 mm and 4 mm, overlaps the mid left ureter.
2. Extensive left nephrolithiasis.

## 2015-01-19 ENCOUNTER — Encounter (HOSPITAL_COMMUNITY): Payer: Self-pay | Admitting: Emergency Medicine

## 2015-01-19 ENCOUNTER — Emergency Department (HOSPITAL_COMMUNITY)
Admission: EM | Admit: 2015-01-19 | Discharge: 2015-01-19 | Disposition: A | Discharge status: Home / Self Care | Payer: 59 | Attending: Emergency Medicine | Admitting: Emergency Medicine

## 2015-01-19 ENCOUNTER — Emergency Department (HOSPITAL_COMMUNITY): Payer: 59

## 2015-01-19 DIAGNOSIS — G43909 Migraine, unspecified, not intractable, without status migrainosus: Secondary | ICD-10-CM | POA: Diagnosis not present

## 2015-01-19 DIAGNOSIS — N200 Calculus of kidney: Secondary | ICD-10-CM

## 2015-01-19 DIAGNOSIS — Z79899 Other long term (current) drug therapy: Secondary | ICD-10-CM | POA: Insufficient documentation

## 2015-01-19 DIAGNOSIS — Z9889 Other specified postprocedural states: Secondary | ICD-10-CM | POA: Diagnosis not present

## 2015-01-19 DIAGNOSIS — Z792 Long term (current) use of antibiotics: Secondary | ICD-10-CM | POA: Diagnosis not present

## 2015-01-19 DIAGNOSIS — R109 Unspecified abdominal pain: Secondary | ICD-10-CM | POA: Diagnosis present

## 2015-01-19 LAB — URINE MICROSCOPIC-ADD ON

## 2015-01-19 LAB — URINALYSIS, ROUTINE W REFLEX MICROSCOPIC
Bilirubin Urine: NEGATIVE
Glucose, UA: NEGATIVE mg/dL
Ketones, ur: NEGATIVE mg/dL
Leukocytes, UA: NEGATIVE
Nitrite: NEGATIVE
Protein, ur: NEGATIVE mg/dL
Specific Gravity, Urine: 1.01 (ref 1.005–1.030)
Urobilinogen, UA: 0.2 mg/dL (ref 0.0–1.0)
pH: 6.5 (ref 5.0–8.0)

## 2015-01-19 MED ORDER — PROMETHAZINE HCL 25 MG PO TABS: 25.0000 mg | ORAL_TABLET | Freq: Four times a day (QID) | ORAL | Status: DC | PRN

## 2015-01-19 MED ORDER — KETOROLAC TROMETHAMINE 10 MG PO TABS: 10.0000 mg | ORAL_TABLET | Freq: Four times a day (QID) | ORAL | Status: DC | PRN

## 2015-01-19 MED ORDER — SODIUM CHLORIDE 0.9 % IV BOLUS (SEPSIS)
500.0000 mL | Freq: Once | INTRAVENOUS | Status: AC
  Administered 2015-01-19: 500 mL via INTRAVENOUS

## 2015-01-19 MED ORDER — HYDROMORPHONE HCL 2 MG PO TABS: 2.0000 mg | ORAL_TABLET | ORAL | Status: DC | PRN

## 2015-01-19 MED ORDER — KETOROLAC TROMETHAMINE 30 MG/ML IJ SOLN
30.0000 mg | Freq: Once | INTRAMUSCULAR | Status: AC
  Administered 2015-01-19: 30 mg via INTRAVENOUS
  Filled 2015-01-19: qty 1

## 2015-01-19 MED ORDER — ONDANSETRON HCL 4 MG/2ML IJ SOLN
4.0000 mg | Freq: Once | INTRAMUSCULAR | Status: AC
  Administered 2015-01-19: 4 mg via INTRAVENOUS
  Filled 2015-01-19: qty 2

## 2015-01-19 MED ORDER — HYDROMORPHONE HCL 1 MG/ML IJ SOLN
1.0000 mg | Freq: Once | INTRAMUSCULAR | Status: AC
Start: 2015-01-19 — End: 2015-01-19
  Administered 2015-01-19: 1 mg via INTRAVENOUS
  Filled 2015-01-19: qty 1

## 2015-01-19 NOTE — ED Provider Notes (Signed)
CSN: [id number]     Arrival date & time 01/19/15  1140 History  This chart was scribed for Donnetta Hutching, MD by Ronney Lion, ED Scribe. This patient was seen in room APA12/APA12 and the patient's care was started at 12:33 PM.     Chief Complaint  Patient presents with  . Flank Pain   The history is provided by the patient. No language interpreter was used.     HPI Comments: Thomas Howell is a 24 y.o. male with a history of multiple kidney stones who presents to the Emergency Department complaining of moderate, non-radiating, left flank pain that began about 6 hours ago, at 6 AM. He had a lithotripsy done on his left kidney 3 weeks ago at [facility], and a history of another lithotripsy and a laser uteroscopy in the past. Patient also had a stent placed on his left kidney July 2015. He is usually given Dilaudid, Toradol, and Zofran, which alleviates his pain. Patient also had leftover pain medication that he tried this morning which alleviated his pain, but he ran out of medication. Dr. Mena Goes at [facility] is his urologist.    Past Medical History  Diagnosis Date  . Migraine   . Complication of anesthesia   . PONV (postoperative nausea and vomiting)   . Left ureteral calculus   . Left nephrolithiasis   . Wears glasses    Past Surgical History  Procedure Laterality Date  . Foot capsule release w/ percutaneous heel cord lengthening, tibial tendon transfer Left 1994    clubfoot  . Lymph gland excision  2003    neck--  benign  . Cystoscopy with retrograde pyelogram, ureteroscopy and stent placement Left 06/24/2014    Procedure: CYSTOSCOPY WITH RETROGRADE PYELOGRAM,  AND STENT PLACEMENT;  Surgeon: Magdalene Molly, MD;  Location: [facility];  Service: Urology;  Laterality: Left;  . Cystoscopy with retrograde pyelogram, ureteroscopy and stent placement Left 07/05/2014    Procedure: CYSTO/LEFT URETEROSCOPY/LEFT RETROGRADE PYELOGRAM/LEFT STENT PLACEMENT;  Surgeon: Jerilee Field, MD;   Location: [place];  Service: Urology;  Laterality: Left;  . Holmium laser application Left 07/05/2014    Procedure: LASER LITHO;  Surgeon: Jerilee Field, MD;  Location: [facility];  Service: Urology;  Laterality: Left;   History reviewed. No pertinent family history. History  Substance Use Topics  . Smoking status: Never Smoker   . Smokeless tobacco: Never Used  . Alcohol Use: Yes     Comment: occ    Review of Systems  Constitutional: Negative for fever.  Genitourinary: Positive for flank pain.  All other systems reviewed and are negative.     Allergies  Codeine and Tape  Home Medications   Prior to Admission medications   Medication Sig Start Date End Date Taking? Authorizing Provider  nortriptyline (PAMELOR) 50 MG capsule Take 2 po at bedtime Patient taking differently: Take 100 mg by mouth at bedtime.  09/06/14  Yes Campbell Riches, NP  rizatriptan (MAXALT) 10 MG tablet One po at onset of migraine; may repeat once in 2 hours if needed; max 2 per 24 hours 09/06/14  Yes Campbell Riches, NP  tamsulosin (FLOMAX) 0.4 MG CAPS capsule Take 1 capsule (0.4 mg total) by mouth daily after supper. 12/26/14  Yes Jerilee Field, MD  azithromycin (ZITHROMAX Z-PAK) 250 MG tablet Take 2 tablets (500 mg) on  Day 1,  followed by 1 tablet (250 mg) once daily on Days 2 through 5. Patient not taking: Reported  on 01/19/2015 12/26/14   Jerilee FieldMatthew Eskridge, MD  HYDROmorphone (DILAUDID) 2 MG tablet Take 1 tablet (2 mg total) by mouth every 4 (four) hours as needed for severe pain. 01/19/15   Donnetta HutchingBrian Trini Christiansen, MD  ketorolac (TORADOL) 10 MG tablet Take 1 tablet (10 mg total) by mouth every 6 (six) hours as needed. 01/19/15   Donnetta HutchingBrian Matalyn Nawaz, MD  oxyCODONE-acetaminophen (PERCOCET/ROXICET) 5-325 MG per tablet Take 1-2 tablets by mouth every 4 (four) hours as needed for severe pain. Patient not taking: Reported on 01/19/2015 12/26/14   Jerilee FieldMatthew Eskridge, MD  phentermine (ADIPEX-P) 37.5 MG  tablet Take 1 tablet (37.5 mg total) by mouth daily before breakfast. 09/06/14   Campbell Richesarolyn C Hoskins, NP  promethazine (PHENERGAN) 25 MG tablet Take 1 tablet (25 mg total) by mouth every 6 (six) hours as needed. 01/19/15   Donnetta HutchingBrian Krizia Flight, MD   BP 127/88 mmHg  Pulse 91  Temp(Src) 97.8 F (36.6 C) (Oral)  Resp 16  Ht 5\' 9"  (1.753 m)  Wt 215 lb (97.523 kg)  BMI 31.74 kg/m2  SpO2 94% Physical Exam  Constitutional: He is oriented to person, place, and time. He appears well-developed and well-nourished.  HENT:  Head: Normocephalic and atraumatic.  Eyes: Conjunctivae and EOM are normal. Pupils are equal, round, and reactive to light.  Neck: Normal range of motion. Neck supple.  Cardiovascular: Normal rate and regular rhythm.   Pulmonary/Chest: Effort normal and breath sounds normal.  Abdominal: Soft. Bowel sounds are normal.  Genitourinary:  Tenderness on left flank.  Musculoskeletal: Normal range of motion.  Neurological: He is alert and oriented to person, place, and time.  Skin: Skin is warm and dry.  Psychiatric: He has a normal mood and affect. His behavior is normal.  Nursing note and vitals reviewed.   ED Course  Procedures (including critical care time)  DIAGNOSTIC STUDIES: Oxygen Saturation is 98% on room air, normal by my interpretation.    COORDINATION OF CARE: 12:34 PM - Discussed treatment plan with pt at bedside which includes pain medications and XR abdomen and pt agreed to plan.    Labs Review Labs Reviewed  URINALYSIS, ROUTINE W REFLEX MICROSCOPIC - Abnormal; Notable for the following:    Hgb urine dipstick LARGE (*)    All other components within normal limits  URINE MICROSCOPIC-ADD ON - Abnormal; Notable for the following:    Bacteria, UA FEW (*)    All other components within normal limits    Imaging Review Dg Abd 1 View  01/19/2015   CLINICAL DATA:  Left flank pain, history of lithotripsy 3 weeks ago  EXAM: ABDOMEN - 1 VIEW  COMPARISON:  12/26/2014  FINDINGS:  Multiple densities project in the left mid abdomen, appearing more tightly clustered possibly more numerous than on the prior exam. There is subtle density that projects the left of L3, not evident previously and possibly representing a proximal ureteral stone.  Moderate increased stool is noted in the rectosigmoid. No evidence of bowel obstruction.  Stable levoscoliosis of the lumbar spine.  IMPRESSION: Possible small proximal left ureteral stone. Multiple densities project in the lower pole of the left kidney with a mild change in number and configuration from the prior study consistent with interval lithotripsy.   Electronically Signed   By: Amie Portlandavid  Ormond M.D.   On: 01/19/2015 13:45     EKG Interpretation None      MDM   Final diagnoses:  Kidney stone on left side   History and physical suggestive of left  kidney stone. I avoided a CT scan secondary to risk of radiation exposure. Patient is hemodynamically stable. Will treat pain. Discussed findings of tests with patient and his aunt. Referral to urologist. Discharge medications oral Dilantin, Phenergan 25 mg, Toradol 10 mg  I personally performed the services described in this documentation, which was scribed in my presence. The recorded information has been reviewed and is accurate.      Donnetta Hutching, MD 01/21/15 1022

## 2015-01-19 NOTE — Discharge Instructions (Signed)
Follow-up with your urologist this week. Prescriptions for pain medicine, nausea medicine, Toradol.

## 2015-01-19 NOTE — ED Notes (Signed)
Pt verbalized understanding of no driving and to use caution within 4 hours of taking pain meds due to meds cause drowsiness 

## 2015-01-19 NOTE — ED Notes (Signed)
Iv d/c'd to left AC, catheter intact and site wnl

## 2015-01-19 NOTE — ED Notes (Signed)
Patient c/o left flank pain, non-radiating. Per patient extensive hx of kidney stones. Patient reports having lithotripsy 3 weeks ago. Patient states this pain started this morning. Patient is nauseated but denies any blood in urine, fevers, or vomiting.

## 2015-02-17 ENCOUNTER — Ambulatory Visit (INDEPENDENT_AMBULATORY_CARE_PROVIDER_SITE_OTHER): Payer: 59 | Admitting: Nurse Practitioner

## 2015-02-17 ENCOUNTER — Encounter: Payer: Self-pay | Admitting: Nurse Practitioner

## 2015-02-17 VITALS — BP 128/80 | Temp 98.8°F | Ht 69.0 in

## 2015-02-17 DIAGNOSIS — J300 Vasomotor rhinitis: Secondary | ICD-10-CM

## 2015-02-17 DIAGNOSIS — G43009 Migraine without aura, not intractable, without status migrainosus: Secondary | ICD-10-CM | POA: Diagnosis not present

## 2015-02-17 MED ORDER — AZITHROMYCIN 250 MG PO TABS: ORAL_TABLET | ORAL | Status: DC

## 2015-02-17 MED ORDER — FLUTICASONE PROPIONATE 50 MCG/ACT NA SUSP: 2.0000 | Freq: Every day | NASAL | Status: DC

## 2015-02-17 MED ORDER — TOPIRAMATE 25 MG PO TABS: ORAL_TABLET | ORAL | Status: DC

## 2015-02-20 ENCOUNTER — Encounter: Payer: Self-pay | Admitting: Nurse Practitioner

## 2015-02-20 NOTE — Progress Notes (Signed)
Subjective:  Presents for recheck on his migraines. Was doing well on Nortriptyline for awhile but headaches steadily increasing. Had 3 migraines last week. No specific triggers. No change in symptomatology. Also c/o head congestion that started yesterday. No fever or sinus headache. Scratchy throat. Ear pressure. No cough.   Objective:   BP 128/80 mmHg  Temp(Src) 98.8 F (37.1 C) (Oral)  Ht 5\' 9"  (1.753 m) NAD. Alert, oriented. TMs clear effusion, no erythema. Pharynx injected with cloudy PND noted. Lungs clear. Heart RRR.   Assessment:  Problem List Items Addressed This Visit      Cardiovascular and Mediastinum   Migraine headache without aura - Primary   Relevant Medications   topiramate (TOPAMAX) tablet    Other Visit Diagnoses    Vasomotor rhinitis          Plan:  Meds ordered this encounter  Medications  . DISCONTD: promethazine (PHENERGAN) 12.5 MG tablet    Sig:     Refill:  0  . topiramate (TOPAMAX) 25 MG tablet    Sig: One po qhs x 7 d then 2 po qhs    Dispense:  49 tablet    Refill:  0    Order Specific Question:  Supervising Provider    Answer:  Merlyn AlbertLUKING, WILLIAM S [[id number]]  . azithromycin (ZITHROMAX Z-PAK) 250 MG tablet    Sig: Take 2 tablets (500 mg) on  Day 1,  followed by 1 tablet (250 mg) once daily on Days 2 through 5.    Dispense:  6 each    Refill:  0    Order Specific Question:  Supervising Provider    Answer:  Merlyn AlbertLUKING, WILLIAM S [[id number]]  . fluticasone (FLONASE) 50 MCG/ACT nasal spray    Sig: Place 2 sprays into both nostrils daily.    Dispense:  16 g    Refill:  5    Order Specific Question:  Supervising Provider    Answer:  Merlyn AlbertLUKING, WILLIAM S [[id number]]   Given Rx for Zpack in case it is needed. OTC antihistamine as directed. Wean off amitriptyline, decrease to 50 mg for several days then stop. Start Topamax as directed.  Return in about 3 months (around 05/18/2015) for migraine recheck. Call sooner if no improvement.

## 2015-03-17 ENCOUNTER — Other Ambulatory Visit: Payer: Self-pay | Admitting: Nurse Practitioner

## 2015-03-28 ENCOUNTER — Other Ambulatory Visit: Payer: Self-pay | Admitting: Nurse Practitioner

## 2015-03-28 ENCOUNTER — Other Ambulatory Visit: Payer: Self-pay | Admitting: *Deleted

## 2015-03-28 NOTE — Telephone Encounter (Signed)
Remove his age azithromycin from his med list may refill this time 6

## 2015-04-01 ENCOUNTER — Emergency Department (HOSPITAL_COMMUNITY)
Admission: EM | Admit: 2015-04-01 | Discharge: 2015-04-01 | Disposition: A | Discharge status: Home / Self Care | Payer: 59 | Attending: Emergency Medicine | Admitting: Emergency Medicine

## 2015-04-01 ENCOUNTER — Encounter (HOSPITAL_COMMUNITY): Payer: Self-pay

## 2015-04-01 DIAGNOSIS — L299 Pruritus, unspecified: Secondary | ICD-10-CM | POA: Diagnosis not present

## 2015-04-01 DIAGNOSIS — R21 Rash and other nonspecific skin eruption: Secondary | ICD-10-CM | POA: Diagnosis not present

## 2015-04-01 DIAGNOSIS — Z79899 Other long term (current) drug therapy: Secondary | ICD-10-CM | POA: Diagnosis not present

## 2015-04-01 DIAGNOSIS — T781XXA Other adverse food reactions, not elsewhere classified, initial encounter: Secondary | ICD-10-CM | POA: Insufficient documentation

## 2015-04-01 DIAGNOSIS — Z8679 Personal history of other diseases of the circulatory system: Secondary | ICD-10-CM | POA: Diagnosis not present

## 2015-04-01 DIAGNOSIS — Z7952 Long term (current) use of systemic steroids: Secondary | ICD-10-CM | POA: Insufficient documentation

## 2015-04-01 DIAGNOSIS — Z87442 Personal history of urinary calculi: Secondary | ICD-10-CM | POA: Diagnosis not present

## 2015-04-01 DIAGNOSIS — T7840XA Allergy, unspecified, initial encounter: Secondary | ICD-10-CM

## 2015-04-01 MED ORDER — DIPHENHYDRAMINE HCL 25 MG PO TABS: 50.0000 mg | ORAL_TABLET | Freq: Four times a day (QID) | ORAL | Status: DC | PRN

## 2015-04-01 MED ORDER — FAMOTIDINE 20 MG PO TABS
20.0000 mg | ORAL_TABLET | Freq: Once | ORAL | Status: AC
  Administered 2015-04-01: 20 mg via ORAL

## 2015-04-01 MED ORDER — DIPHENHYDRAMINE HCL 25 MG PO CAPS
ORAL_CAPSULE | ORAL | Status: AC
  Filled 2015-04-01: qty 2

## 2015-04-01 MED ORDER — DIPHENHYDRAMINE HCL 25 MG PO CAPS
50.0000 mg | ORAL_CAPSULE | Freq: Once | ORAL | Status: AC
  Administered 2015-04-01: 50 mg via ORAL

## 2015-04-01 MED ORDER — FAMOTIDINE 20 MG PO TABS
ORAL_TABLET | ORAL | Status: AC
  Filled 2015-04-01: qty 1

## 2015-04-01 MED ORDER — DEXAMETHASONE 4 MG PO TABS
ORAL_TABLET | ORAL | Status: AC
  Filled 2015-04-01: qty 3

## 2015-04-01 MED ORDER — DEXAMETHASONE 4 MG PO TABS
10.0000 mg | ORAL_TABLET | Freq: Once | ORAL | Status: AC
  Administered 2015-04-01: 10 mg via ORAL

## 2015-04-01 NOTE — ED Notes (Signed)
Patient states he drank chocolate milk and right eye started swelling, and throat was tingling. Patient states redness and itching to his face at this time.

## 2015-04-01 NOTE — Discharge Instructions (Signed)

## 2015-04-01 NOTE — ED Notes (Signed)
Patient verbalizes understanding of discharge instructions, prescription medications, home care and follow up care. Patient ambulatory out of department at this time. 

## 2015-04-01 NOTE — ED Provider Notes (Addendum)
CSN: [id number]     Arrival date & time 04/01/15  0001 History   First MD Initiated Contact with Patient 04/01/15 0209     Chief Complaint  Patient presents with  . Allergic Reaction     (Consider location/radiation/quality/duration/timing/severity/associated sxs/prior Treatment) HPI  This is a [age] year old male who developed itching and an urticarial rash of his face, particularly below the right eye, about 11:30 PM yesterday evening. It occurred right after drinking chocolate milk. He has no history of milk or chocolate allergies. His face also felt tingly. And he had some scratchiness in his throat but denies throat swelling, shortness of breath, wheezing, nausea, vomiting or diarrhea. Symptoms have improved without treatment. He continues to scratch his face. Symptoms are mild to moderate. He also has nasal congestion but attributes this to seasonal allergies.  Past Medical History  Diagnosis Date  . Migraine   . Complication of anesthesia   . PONV (postoperative nausea and vomiting)   . Left ureteral calculus   . Left nephrolithiasis   . Wears glasses    Past Surgical History  Procedure Laterality Date  . Foot capsule release w/ percutaneous heel cord lengthening, tibial tendon transfer Left 1994    clubfoot  . Lymph gland excision  2003    neck--  benign  . Cystoscopy with retrograde pyelogram, ureteroscopy and stent placement Left 06/24/2014    Procedure: CYSTOSCOPY WITH RETROGRADE PYELOGRAM,  AND STENT PLACEMENT;  Surgeon: Magdalene Mollyaniel Y Woodruff, MD;  Location: [facility];  Service: Urology;  Laterality: Left;  . Cystoscopy with retrograde pyelogram, ureteroscopy and stent placement Left 07/05/2014    Procedure: CYSTO/LEFT URETEROSCOPY/LEFT RETROGRADE PYELOGRAM/LEFT STENT PLACEMENT;  Surgeon: Jerilee FieldMatthew Eskridge, MD;  [facility];  Service: Urology;  Laterality: Left;  . Holmium laser application Left 07/05/2014    Procedure: LASER LITHO;  Surgeon: Jerilee FieldMatthew Eskridge,  MD;  [facility];  Service: Urology;  Laterality: Left;   History reviewed. No pertinent family history. History  Substance Use Topics  . Smoking status: Never Smoker   . Smokeless tobacco: Never Used  . Alcohol Use: Yes     Comment: occ    Review of Systems  All other systems reviewed and are negative.   Allergies  Codeine; Other; and Tape  Home Medications   Prior to Admission medications   Medication Sig Start Date End Date Taking? Authorizing Provider  fluticasone (FLONASE) 50 MCG/ACT nasal spray Place 2 sprays into both nostrils daily. 02/17/15  Yes Campbell Richesarolyn C Hoskins, NP  topiramate (TOPAMAX) 25 MG tablet TAKE 1 TABLET BY MOUTH AT BEDTIME EVERY DAY FOR 7 DAYS THEN 2 TABLETS DAILY AT BEDTIME 03/17/15  Yes Babs SciaraScott A Luking, MD  nortriptyline (PAMELOR) 50 MG capsule TAKE 2 CAPSULES BY MOUTH AT BEDTIME 03/28/15   Babs SciaraScott A Luking, MD  phentermine (ADIPEX-P) 37.5 MG tablet Take 1 tablet (37.5 mg total) by mouth daily before breakfast. 09/06/14   Campbell Richesarolyn C Hoskins, NP  promethazine (PHENERGAN) 25 MG tablet Take 1 tablet (25 mg total) by mouth every 6 (six) hours as needed. 01/19/15   Donnetta HutchingBrian Cook, MD  rizatriptan (MAXALT) 10 MG tablet One po at onset of migraine; may repeat once in 2 hours if needed; max 2 per 24 hours 09/06/14   Campbell Richesarolyn C Hoskins, NP   BP 135/94 mmHg  Pulse 66  Temp(Src) 98.4 F (36.9 C) (Oral)  Resp 20  Ht 5\' 8"  (1.727 m)  Wt 195 lb (88.451 kg)  BMI 29.66 kg/m2  SpO2 98%   Physical Exam  General: Well-developed, well-nourished male in no acute distress; appearance consistent with age of record HENT: normocephalic; atraumatic; mild erythema and edema below eyes, right greater than left; no dysphonia; nasal congestion Eyes: pupils equal, round and reactive to light; extraocular muscles intact Neck: supple Heart: regular rate and rhythm Lungs: clear to auscultation bilaterally Abdomen: soft; nondistended Extremities: No deformity; full range  of motion Neurologic: Awake, alert and oriented; motor function intact in all extremities and symmetric; no facial droop Skin: Warm and dry; no rash Psychiatric: Normal mood and affect    ED Course  Procedures (including critical care time)   MDM  Patient advised to avoid chocolate milk in the future.     Paula Libra, MD 04/01/15 1610  Paula Libra, MD 04/01/15 9604

## 2015-04-14 ENCOUNTER — Other Ambulatory Visit: Payer: Self-pay | Admitting: Family Medicine

## 2015-04-15 ENCOUNTER — Other Ambulatory Visit: Payer: Self-pay | Admitting: Nurse Practitioner

## 2015-04-15 MED ORDER — RIZATRIPTAN BENZOATE 10 MG PO TBDP: 10.0000 mg | ORAL_TABLET | ORAL | Status: DC | PRN

## 2015-05-02 ENCOUNTER — Ambulatory Visit (INDEPENDENT_AMBULATORY_CARE_PROVIDER_SITE_OTHER): Payer: 59 | Admitting: Family Medicine

## 2015-05-02 ENCOUNTER — Other Ambulatory Visit: Payer: Self-pay | Admitting: Nurse Practitioner

## 2015-05-02 ENCOUNTER — Encounter: Payer: Self-pay | Admitting: Family Medicine

## 2015-05-02 VITALS — BP 110/68 | Ht 69.0 in | Wt 191.6 lb

## 2015-05-02 DIAGNOSIS — R21 Rash and other nonspecific skin eruption: Secondary | ICD-10-CM | POA: Diagnosis not present

## 2015-05-02 MED ORDER — TOPIRAMATE 100 MG PO TABS: ORAL_TABLET | ORAL | Status: DC

## 2015-05-02 NOTE — Progress Notes (Signed)
   Subjective:    Patient ID: Thomas MendsRussell K Kittel, male    DOB: [birth year], 24 y.o.   MRN: [id number]  HPI Patient is here today for an ER follow up visit. [facility] 1 month [facility] for an allergic reaction. Patient is doing well now.    Patient drink chocolate milk. Within an hour he had a very itchy rash on the side of his face.   Also noted a little bit of swollen in the lip.    No shortness breath no headache    Review of Systems     No chest pain no loss of consciousness never is had this before ROS otherwise negative Objective:   Physical Exam   alert vital stable HEENT normal pharynx normal neck supple lungs clear heart regular in rhythm.      Assessment & Plan:   impression acute urticaria possible allergy related discussed plan avoid chocolate milk. If further spells occur will need allergies G referral. Warning signs discussed. WS L

## 2015-05-16 ENCOUNTER — Ambulatory Visit: Payer: 59 | Admitting: Nurse Practitioner

## 2015-06-26 ENCOUNTER — Encounter (HOSPITAL_COMMUNITY): Payer: Self-pay | Admitting: Emergency Medicine

## 2015-06-26 ENCOUNTER — Emergency Department (HOSPITAL_COMMUNITY)
Admission: EM | Admit: 2015-06-26 | Discharge: 2015-06-26 | Disposition: A | Discharge status: Home / Self Care | Payer: 59 | Attending: Emergency Medicine | Admitting: Emergency Medicine

## 2015-06-26 ENCOUNTER — Emergency Department (HOSPITAL_COMMUNITY): Payer: 59

## 2015-06-26 DIAGNOSIS — Z9889 Other specified postprocedural states: Secondary | ICD-10-CM | POA: Diagnosis not present

## 2015-06-26 DIAGNOSIS — G43909 Migraine, unspecified, not intractable, without status migrainosus: Secondary | ICD-10-CM | POA: Insufficient documentation

## 2015-06-26 DIAGNOSIS — N23 Unspecified renal colic: Secondary | ICD-10-CM | POA: Diagnosis not present

## 2015-06-26 DIAGNOSIS — R109 Unspecified abdominal pain: Secondary | ICD-10-CM

## 2015-06-26 DIAGNOSIS — Z87442 Personal history of urinary calculi: Secondary | ICD-10-CM | POA: Insufficient documentation

## 2015-06-26 LAB — URINALYSIS, ROUTINE W REFLEX MICROSCOPIC
Bilirubin Urine: NEGATIVE
Glucose, UA: NEGATIVE mg/dL
Ketones, ur: NEGATIVE mg/dL
Leukocytes, UA: NEGATIVE
Nitrite: NEGATIVE
Protein, ur: NEGATIVE mg/dL
Specific Gravity, Urine: 1.02 (ref 1.005–1.030)
Urobilinogen, UA: 0.2 mg/dL (ref 0.0–1.0)
pH: 6 (ref 5.0–8.0)

## 2015-06-26 LAB — BASIC METABOLIC PANEL
Anion gap: 8 (ref 5–15)
BUN: 19 mg/dL (ref 6–20)
CO2: 24 mmol/L (ref 22–32)
Calcium: 8.7 mg/dL — ABNORMAL LOW (ref 8.9–10.3)
Chloride: 106 mmol/L (ref 101–111)
Creatinine, Ser: 1.1 mg/dL (ref 0.61–1.24)
GFR calc Af Amer: >60 mL/min (ref >60)
GFR calc non Af Amer: >60 mL/min (ref >60)
Glucose, Bld: 93 mg/dL (ref 65–99)
Potassium: 3.5 mmol/L (ref 3.5–5.1)
Sodium: 138 mmol/L (ref 135–145)

## 2015-06-26 LAB — CBC WITH DIFFERENTIAL/PLATELET
Basophils Absolute: 0 10*3/uL (ref 0.0–0.1)
Basophils Relative: 0 % (ref 0–1)
Eosinophils Absolute: 0.1 10*3/uL (ref 0.0–0.7)
Eosinophils Relative: 1 % (ref 0–5)
HCT: 42.8 % (ref 39.0–52.0)
Hemoglobin: 14.8 g/dL (ref 13.0–17.0)
Lymphocytes Relative: 52 % — ABNORMAL HIGH (ref 12–46)
Lymphs Abs: 3.4 10*3/uL (ref 0.7–4.0)
MCH: 30.6 pg (ref 26.0–34.0)
MCHC: 34.6 g/dL (ref 30.0–36.0)
MCV: 88.6 fL (ref 78.0–100.0)
Monocytes Absolute: 0.6 10*3/uL (ref 0.1–1.0)
Monocytes Relative: 10 % (ref 3–12)
Neutro Abs: 2.4 10*3/uL (ref 1.7–7.7)
Neutrophils Relative %: 37 % — ABNORMAL LOW (ref 43–77)
Platelets: 181 10*3/uL (ref 150–400)
RBC: 4.83 MIL/uL (ref 4.22–5.81)
RDW: 12.8 % (ref 11.5–15.5)
WBC: 6.5 10*3/uL (ref 4.0–10.5)

## 2015-06-26 LAB — URINE MICROSCOPIC-ADD ON

## 2015-06-26 MED ORDER — SODIUM CHLORIDE 0.9 % IV BOLUS (SEPSIS)
1000.0000 mL | Freq: Once | INTRAVENOUS | Status: AC
  Administered 2015-06-26: 1000 mL via INTRAVENOUS

## 2015-06-26 MED ORDER — SODIUM CHLORIDE 0.9 % IV BOLUS (SEPSIS)
1000.0000 mL | Freq: Once | INTRAVENOUS | Status: AC
Start: 2015-06-26 — End: 2015-06-26
  Administered 2015-06-26: 1000 mL via INTRAVENOUS

## 2015-06-26 MED ORDER — TAMSULOSIN HCL 0.4 MG PO CAPS: 0.4000 mg | ORAL_CAPSULE | Freq: Every day | ORAL | Status: DC

## 2015-06-26 MED ORDER — OXYCODONE-ACETAMINOPHEN 5-325 MG PO TABS: 1.0000 | ORAL_TABLET | ORAL | Status: DC | PRN

## 2015-06-26 MED ORDER — ONDANSETRON HCL 4 MG/2ML IJ SOLN
4.0000 mg | Freq: Once | INTRAMUSCULAR | Status: AC
  Administered 2015-06-26: 4 mg via INTRAVENOUS
  Filled 2015-06-26: qty 2

## 2015-06-26 MED ORDER — HYDROMORPHONE HCL 1 MG/ML IJ SOLN
1.0000 mg | Freq: Once | INTRAMUSCULAR | Status: AC
  Administered 2015-06-26: 1 mg via INTRAVENOUS
  Filled 2015-06-26: qty 1

## 2015-06-26 MED ORDER — ONDANSETRON HCL 4 MG PO TABS: 4.0000 mg | ORAL_TABLET | Freq: Four times a day (QID) | ORAL | Status: DC

## 2015-06-26 MED ORDER — HYDROCODONE-ACETAMINOPHEN 5-325 MG PO TABS
2.0000 | ORAL_TABLET | Freq: Once | ORAL | Status: AC
  Administered 2015-06-26: 2 via ORAL
  Filled 2015-06-26: qty 2

## 2015-06-26 MED ORDER — KETOROLAC TROMETHAMINE 30 MG/ML IJ SOLN
30.0000 mg | Freq: Once | INTRAMUSCULAR | Status: AC
  Administered 2015-06-26: 30 mg via INTRAVENOUS
  Filled 2015-06-26: qty 1

## 2015-06-26 MED ORDER — IBUPROFEN 800 MG PO TABS: 800.0000 mg | ORAL_TABLET | Freq: Three times a day (TID) | ORAL | Status: DC

## 2015-06-26 NOTE — ED Notes (Signed)
MD Rancour at bedside 

## 2015-06-26 NOTE — Discharge Instructions (Signed)
Ureteral Colic (Kidney Stones) °Ureteral colic is the result of a condition when kidney stones form inside the kidney. Once kidney stones are formed they may move into the tube that connects the kidney with the bladder (ureter). If this occurs, this condition may cause pain (colic) in the ureter.  °CAUSES  °Pain is caused by stone movement in the ureter and the obstruction caused by the stone. °SYMPTOMS  °The pain comes and goes as the ureter contracts around the stone. The pain is usually intense, sharp, and stabbing in character. The location of the pain may move as the stone moves through the ureter. When the stone is near the kidney the pain is usually located in the back and radiates to the belly (abdomen). When the stone is ready to pass into the bladder the pain is often located in the lower abdomen on the side the stone is located. At this location, the symptoms may mimic those of a urinary tract infection with urinary frequency. Once the stone is located here it often passes into the bladder and the pain disappears completely. °TREATMENT  °· Your caregiver will provide you with medicine for pain relief. °· You may require specialized follow-up X-rays. °· The absence of pain does not always mean that the stone has passed. It may have just stopped moving. If the urine remains completely obstructed, it can cause loss of kidney function or even complete destruction of the involved kidney. It is your responsibility and in your interest that X-rays and follow-ups as suggested by your caregiver are completed. Relief of pain without passage of the stone can be associated with severe damage to the kidney, including loss of kidney function on that side. °· If your stone does not pass on its own, additional measures may be taken by your caregiver to ensure its removal. °HOME CARE INSTRUCTIONS  °· Increase your fluid intake. Water is the preferred fluid since juices containing vitamin C may acidify the urine making it  less likely for certain stones (uric acid stones) to pass. °· Strain all urine. A strainer will be provided. Keep all particulate matter or stones for your caregiver to inspect. °· Take your pain medicine as directed. °· Make a follow-up appointment with your caregiver as directed. °· Remember that the goal is passage of your stone. The absence of pain does not mean the stone is gone. Follow your caregiver's instructions. °· Only take over-the-counter or prescription medicines for pain, discomfort, or fever as directed by your caregiver. °SEEK MEDICAL CARE IF:  °· Pain cannot be controlled with the prescribed medicine. °· You have a fever. °· Pain continues for longer than your caregiver advises it should. °· There is a change in the pain, and you develop chest discomfort or constant abdominal pain. °· You feel faint or pass out. °MAKE SURE YOU:  °· Understand these instructions. °· Will watch your condition. °· Will get help right away if you are not doing well or get worse. °Document Released: 09/15/2005 Document Revised: 04/02/2013 Document Reviewed: 06/02/2011 °[employer]® Patient Information ©2015 [employer]. This information is not intended to replace advice given to you by your health care provider. Make sure you discuss any questions you have with your health care provider. ° °

## 2015-06-26 NOTE — ED Notes (Signed)
Pt started with lt flank pain on July 4th - pain staying in back - radiating around to front   Feeling nauseated -  Pt has hx of Kidney stones

## 2015-06-26 NOTE — ED Provider Notes (Signed)
CSN: [id number]     Arrival date & time 06/26/15  1755 History   First MD Initiated Contact with Patient 06/26/15 1818     Chief Complaint  Patient presents with  . Flank Pain     (Consider location/radiation/quality/duration/timing/severity/associated sxs/prior Treatment) HPI Comments: Patient presents with left flank pain for the past 3 days it is becoming worse. Reading to left frontal abdomen. Feels similar to previous kidney stones. History of lithotripsy as well as stent placement in 2015. Denies fever. Has nausea but no vomiting. Endorses hematuria but no dysuria. No testicular pain. No chest pain or shortness of breath. No right-sided abdominal pain. No diarrhea. Been taking tramadol at home without relief.  Patient is a 24 y.o. male presenting with flank pain. The history is provided by the patient.  Flank Pain Associated symptoms include abdominal pain. Pertinent negatives include no chest pain, no headaches and no shortness of breath.    Past Medical History  Diagnosis Date  . Migraine   . Complication of anesthesia   . PONV (postoperative nausea and vomiting)   . Left ureteral calculus   . Left nephrolithiasis   . Wears glasses    Past Surgical History  Procedure Laterality Date  . Foot capsule release w/ percutaneous heel cord lengthening, tibial tendon transfer Left 1994    clubfoot  . Lymph gland excision  2003    neck--  benign  . Cystoscopy with retrograde pyelogram, ureteroscopy and stent placement Left 06/24/2014    Procedure: CYSTOSCOPY WITH RETROGRADE PYELOGRAM,  AND STENT PLACEMENT;  Surgeon: Magdalene Molly, MD;  Location: [facility];  Service: Urology;  Laterality: Left;  . Cystoscopy with retrograde pyelogram, ureteroscopy and stent placement Left 07/05/2014    Procedure: CYSTO/LEFT URETEROSCOPY/LEFT RETROGRADE PYELOGRAM/LEFT STENT PLACEMENT;  Surgeon: Jerilee Field, MD;  Location: [facility];  Service: Urology;  Laterality: Left;  . Holmium  laser application Left 07/05/2014    Procedure: LASER LITHO;  Surgeon: Jerilee Field, MD;  Location: [facility];  Service: Urology;  Laterality: Left;   No family history on file. History  Substance Use Topics  . Smoking status: Never Smoker   . Smokeless tobacco: Never Used  . Alcohol Use: 0.0 oz/week    0 Standard drinks or equivalent per week     Comment: occ    Review of Systems  Constitutional: Negative for fever, activity change and appetite change.  HENT: Negative for congestion and rhinorrhea.   Respiratory: Negative for cough, chest tightness and shortness of breath.   Cardiovascular: Negative for chest pain.  Gastrointestinal: Positive for nausea and abdominal pain. Negative for vomiting.  Genitourinary: Positive for dysuria, frequency, hematuria and flank pain. Negative for urgency and testicular pain.  Musculoskeletal: Positive for myalgias, back pain and arthralgias.  Skin: Negative for rash.  Neurological: Negative for dizziness, weakness and headaches.  A complete 10 system review of systems was obtained and all systems are negative except as noted in the HPI and PMH.      Allergies  Codeine; Other; and Tape  Home Medications   Prior to Admission medications   Medication Sig Start Date End Date Taking? Authorizing Provider  topiramate (TOPAMAX) 100 MG tablet One po qhs Patient taking differently: Take 100 mg by mouth at bedtime. One po qhs 05/02/15  Yes Campbell Riches, NP  diphenhydrAMINE (BENADRYL) 25 MG tablet Take 2 tablets (50 mg total) by mouth every 6 (six) hours as needed for itching or allergies. Patient not taking:  Reported on 06/26/2015 04/01/15   Paula LibraJohn Molpus, MD  fluticasone (FLONASE) 50 MCG/ACT nasal spray Place 2 sprays into both nostrils daily. Patient not taking: Reported on 06/26/2015 02/17/15   Campbell Richesarolyn C Hoskins, NP  ibuprofen (ADVIL,MOTRIN) 800 MG tablet Take 1 tablet (800 mg total) by mouth 3 (three) times daily. 06/26/15    Glynn OctaveStephen Lashala Laser, MD  ondansetron (ZOFRAN) 4 MG tablet Take 1 tablet (4 mg total) by mouth every 6 (six) hours. 06/26/15   Glynn OctaveStephen Leith Hedlund, MD  oxyCODONE-acetaminophen (PERCOCET/ROXICET) 5-325 MG per tablet Take 1 tablet by mouth every 4 (four) hours as needed for severe pain. 06/26/15   Glynn OctaveStephen Liisa Picone, MD  rizatriptan (MAXALT-MLT) 10 MG disintegrating tablet Take 1 tablet (10 mg total) by mouth as needed for migraine. May repeat in 2 hours if needed; max 2 per 24 hrs 04/15/15   Campbell Richesarolyn C Hoskins, NP  tamsulosin (FLOMAX) 0.4 MG CAPS capsule Take 1 capsule (0.4 mg total) by mouth daily. 06/26/15   Glynn OctaveStephen Pinkey Mcjunkin, MD   BP 111/60 mmHg  Pulse 62  Temp(Src) 97.7 F (36.5 C) (Oral)  Resp 18  Ht 5\' 8"  (1.727 m)  Wt 185 lb (83.915 kg)  BMI 28.14 kg/m2  SpO2 98% Physical Exam  Constitutional: He is oriented to person, place, and time. He appears well-developed and well-nourished. No distress.  HENT:  Head: Normocephalic and atraumatic.  Mouth/Throat: Oropharynx is clear and moist. No oropharyngeal exudate.  Eyes: Conjunctivae and EOM are normal. Pupils are equal, round, and reactive to light.  Neck: Normal range of motion. Neck supple.  No meningismus.  Cardiovascular: Normal rate, regular rhythm, normal heart sounds and intact distal pulses.   No murmur heard. Pulmonary/Chest: Effort normal and breath sounds normal. No respiratory distress. He exhibits no tenderness.  Abdominal: Soft. There is tenderness. There is no rebound and no guarding.  TTP LLQ   Genitourinary:  No testicular pain  Musculoskeletal: Normal range of motion. He exhibits tenderness. He exhibits no edema.  L CVAT  Neurological: He is alert and oriented to person, place, and time. No cranial nerve deficit. He exhibits normal muscle tone. Coordination normal.  No ataxia on finger to nose bilaterally. No pronator drift. 5/5 strength throughout. CN 2-12 intact. Negative Romberg. Equal grip strength. Sensation intact. Gait is normal.    Skin: Skin is warm.  Psychiatric: He has a normal mood and affect. His behavior is normal.  Nursing note and vitals reviewed.   ED Course  Procedures (including critical care time) Labs Review Labs Reviewed  URINALYSIS, ROUTINE W REFLEX MICROSCOPIC (NOT AT [facility]) - Abnormal; Notable for the following:    APPearance HAZY (*)    Hgb urine dipstick LARGE (*)    All other components within normal limits  CBC WITH DIFFERENTIAL/PLATELET - Abnormal; Notable for the following:    Neutrophils Relative % 37 (*)    Lymphocytes Relative 52 (*)    All other components within normal limits  BASIC METABOLIC PANEL - Abnormal; Notable for the following:    Calcium 8.7 (*)    All other components within normal limits  URINE MICROSCOPIC-ADD ON - Abnormal; Notable for the following:    Squamous Epithelial / LPF MANY (*)    All other components within normal limits    Imaging Review Ct Renal Stone Study  06/26/2015   CLINICAL DATA:  24 year old male with history of renal stones and left flank pain.  EXAM: CT ABDOMEN AND PELVIS WITHOUT CONTRAST  TECHNIQUE: Multidetector CT imaging of the abdomen  and pelvis was performed following the standard protocol without IV contrast.  COMPARISON:  Radiograph dated 01/24/2015  FINDINGS: Evaluation of this exam is limited in the absence of intravenous contrast.  The visualized lung bases are clear.  No intra-abdominal free air or free fluid.  The liver, gallbladder, pancreas, spleen, and adrenal glands appear unremarkable. There are bilateral nonobstructing renal calculi measuring up to 1.3 cm in the inferior pole of the left kidney. There is no hydronephrosis on either side. There is bilateral renal cortical or lobularity. The visualized ureters and urinary bladder appear unremarkable. The prostate gland is within normal limits by size criteria.  Copious amount of stool noted throughout the colon. There is no evidence of bowel obstruction or inflammation. The appendix is  not visualized with certainty. No inflammatory changes identified in the right lower quadrant.  The visualized abdominal aorta and IVC are grossly unremarkable on this noncontrast study. There is no lymphadenopathy. There is scoliosis of the lumbar spine. No acute fracture.  IMPRESSION: Bilateral nonobstructing renal calculi.  No hydronephrosis.   Electronically Signed   By: Elgie Collard M.D.   On: 06/26/2015 19:09     EKG Interpretation None      MDM   Final diagnoses:  Flank pain  Renal colic   Flank pain with dysuria similar to previous kidney stones. History of multiple urological procedures in the past.  UA shows hematuria without infection. Creatinine normal. CT scan shows multiple renal stones but no ureteral stones.  Suspect possibility of passed kidney stone.  Patient's pain is controlled in the ED. No further vomiting. Tolerating PO in the ED.  patient states he is able to tolerate hydrocodone and oxycodone despite codeine allergy. Follow-up with urology. Return precautions discussed.   Glynn Octave, MD 06/26/15 [phone]

## 2015-06-26 NOTE — ED Notes (Signed)
MD Rancour at bedside updating patient.  

## 2015-06-30 ENCOUNTER — Emergency Department (HOSPITAL_COMMUNITY)
Admission: EM | Admit: 2015-06-30 | Discharge: 2015-06-30 | Disposition: A | Discharge status: Home / Self Care | Payer: 59 | Attending: Emergency Medicine | Admitting: Emergency Medicine

## 2015-06-30 ENCOUNTER — Encounter (HOSPITAL_COMMUNITY): Payer: Self-pay | Admitting: Emergency Medicine

## 2015-06-30 ENCOUNTER — Emergency Department (HOSPITAL_COMMUNITY): Payer: 59

## 2015-06-30 DIAGNOSIS — R319 Hematuria, unspecified: Secondary | ICD-10-CM

## 2015-06-30 DIAGNOSIS — N201 Calculus of ureter: Secondary | ICD-10-CM

## 2015-06-30 DIAGNOSIS — N202 Calculus of kidney with calculus of ureter: Secondary | ICD-10-CM | POA: Diagnosis not present

## 2015-06-30 DIAGNOSIS — Z791 Long term (current) use of non-steroidal anti-inflammatories (NSAID): Secondary | ICD-10-CM | POA: Diagnosis not present

## 2015-06-30 DIAGNOSIS — G43909 Migraine, unspecified, not intractable, without status migrainosus: Secondary | ICD-10-CM | POA: Diagnosis not present

## 2015-06-30 DIAGNOSIS — Z7951 Long term (current) use of inhaled steroids: Secondary | ICD-10-CM | POA: Diagnosis not present

## 2015-06-30 DIAGNOSIS — Z79899 Other long term (current) drug therapy: Secondary | ICD-10-CM | POA: Diagnosis not present

## 2015-06-30 DIAGNOSIS — N200 Calculus of kidney: Secondary | ICD-10-CM

## 2015-06-30 DIAGNOSIS — R109 Unspecified abdominal pain: Secondary | ICD-10-CM | POA: Diagnosis present

## 2015-06-30 LAB — URINALYSIS, ROUTINE W REFLEX MICROSCOPIC
Bilirubin Urine: NEGATIVE
Glucose, UA: NEGATIVE mg/dL
Ketones, ur: NEGATIVE mg/dL
Leukocytes, UA: NEGATIVE
Nitrite: NEGATIVE
Specific Gravity, Urine: 1.02 (ref 1.005–1.030)
Urobilinogen, UA: 0.2 mg/dL (ref 0.0–1.0)
pH: 5.5 (ref 5.0–8.0)

## 2015-06-30 LAB — COMPREHENSIVE METABOLIC PANEL
ALT: 23 U/L (ref 17–63)
AST: 49 U/L — ABNORMAL HIGH (ref 15–41)
Albumin: 4.6 g/dL (ref 3.5–5.0)
Alkaline Phosphatase: 57 U/L (ref 38–126)
Anion gap: 9 (ref 5–15)
BUN: 17 mg/dL (ref 6–20)
CO2: 23 mmol/L (ref 22–32)
Calcium: 8.9 mg/dL (ref 8.9–10.3)
Chloride: 106 mmol/L (ref 101–111)
Creatinine, Ser: 1.18 mg/dL (ref 0.61–1.24)
GFR calc Af Amer: >60 mL/min (ref >60)
GFR calc non Af Amer: >60 mL/min (ref >60)
Glucose, Bld: 98 mg/dL (ref 65–99)
Potassium: 4 mmol/L (ref 3.5–5.1)
Sodium: 138 mmol/L (ref 135–145)
Total Bilirubin: 0.8 mg/dL (ref 0.3–1.2)
Total Protein: 7.8 g/dL (ref 6.5–8.1)

## 2015-06-30 LAB — CBC WITH DIFFERENTIAL/PLATELET
Basophils Absolute: 0 10*3/uL (ref 0.0–0.1)
Basophils Relative: 0 % (ref 0–1)
Eosinophils Absolute: 0.1 10*3/uL (ref 0.0–0.7)
Eosinophils Relative: 1 % (ref 0–5)
HCT: 45.1 % (ref 39.0–52.0)
Hemoglobin: 16.2 g/dL (ref 13.0–17.0)
Lymphocytes Relative: 37 % (ref 12–46)
Lymphs Abs: 2.1 10*3/uL (ref 0.7–4.0)
MCH: 31.7 pg (ref 26.0–34.0)
MCHC: 35.9 g/dL (ref 30.0–36.0)
MCV: 88.3 fL (ref 78.0–100.0)
Monocytes Absolute: 0.4 10*3/uL (ref 0.1–1.0)
Monocytes Relative: 8 % (ref 3–12)
Neutro Abs: 3 10*3/uL (ref 1.7–7.7)
Neutrophils Relative %: 54 % (ref 43–77)
Platelets: 163 10*3/uL (ref 150–400)
RBC: 5.11 MIL/uL (ref 4.22–5.81)
RDW: 12.5 % (ref 11.5–15.5)
WBC: 5.6 10*3/uL (ref 4.0–10.5)

## 2015-06-30 LAB — URINE MICROSCOPIC-ADD ON

## 2015-06-30 MED ORDER — OXYCODONE-ACETAMINOPHEN 5-325 MG PO TABS
1.0000 | ORAL_TABLET | Freq: Once | ORAL | Status: AC
  Administered 2015-06-30: 1 via ORAL
  Filled 2015-06-30: qty 1

## 2015-06-30 MED ORDER — HYDROMORPHONE HCL 1 MG/ML IJ SOLN
1.0000 mg | Freq: Once | INTRAMUSCULAR | Status: AC
  Administered 2015-06-30: 1 mg via INTRAVENOUS
  Filled 2015-06-30: qty 1

## 2015-06-30 MED ORDER — MORPHINE SULFATE 4 MG/ML IJ SOLN: 4.0000 mg | Freq: Once | INTRAMUSCULAR | Status: DC

## 2015-06-30 MED ORDER — KETOROLAC TROMETHAMINE 30 MG/ML IJ SOLN
30.0000 mg | Freq: Once | INTRAMUSCULAR | Status: AC
  Administered 2015-06-30: 30 mg via INTRAVENOUS
  Filled 2015-06-30: qty 1

## 2015-06-30 MED ORDER — PROMETHAZINE HCL 25 MG PO TABS: 25.0000 mg | ORAL_TABLET | Freq: Four times a day (QID) | ORAL | Status: DC | PRN

## 2015-06-30 MED ORDER — ONDANSETRON HCL 4 MG/2ML IJ SOLN
4.0000 mg | Freq: Once | INTRAMUSCULAR | Status: AC
  Administered 2015-06-30: 4 mg via INTRAMUSCULAR
  Filled 2015-06-30: qty 2

## 2015-06-30 MED ORDER — TAMSULOSIN HCL 0.4 MG PO CAPS
0.4000 mg | ORAL_CAPSULE | Freq: Once | ORAL | Status: AC
  Administered 2015-06-30: 0.4 mg via ORAL
  Filled 2015-06-30: qty 1

## 2015-06-30 MED ORDER — OXYCODONE-ACETAMINOPHEN 5-325 MG PO TABS: 1.0000 | ORAL_TABLET | ORAL | Status: DC | PRN

## 2015-06-30 NOTE — ED Notes (Signed)
Pt states that he has been having left flank pain for a week with nausea.  States was seen here and told no kidney stone but pain is getting worse.

## 2015-06-30 NOTE — Discharge Instructions (Signed)
Kidney Stones °Kidney stones (urolithiasis) are deposits that form inside your kidneys. The intense pain is caused by the stone moving through the urinary tract. When the stone moves, the ureter goes into spasm around the stone. The stone is usually passed in the urine.  °CAUSES  °· A disorder that makes certain neck glands produce too much parathyroid hormone (primary hyperparathyroidism). °· A buildup of uric acid crystals, similar to gout in your joints. °· Narrowing (stricture) of the ureter. °· A kidney obstruction present at birth (congenital obstruction). °· Previous surgery on the kidney or ureters. °· Numerous kidney infections. °SYMPTOMS  °· Feeling sick to your stomach (nauseous). °· Throwing up (vomiting). °· Blood in the urine (hematuria). °· Pain that usually spreads (radiates) to the groin. °· Frequency or urgency of urination. °DIAGNOSIS  °· Taking a history and physical exam. °· Blood or urine tests. °· CT scan. °· Occasionally, an examination of the inside of the urinary bladder (cystoscopy) is performed. °TREATMENT  °· Observation. °· Increasing your fluid intake. °· Extracorporeal shock wave lithotripsy--This is a noninvasive procedure that uses shock waves to break up kidney stones. °· Surgery may be needed if you have severe pain or persistent obstruction. There are various surgical procedures. Most of the procedures are performed with the use of small instruments. Only small incisions are needed to accommodate these instruments, so recovery time is minimized. °The size, location, and chemical composition are all important variables that will determine the proper choice of action for you. Talk to your health care provider to better understand your situation so that you will minimize the risk of injury to yourself and your kidney.  °HOME CARE INSTRUCTIONS  °· Drink enough water and fluids to keep your urine clear or pale yellow. This will help you to pass the stone or stone fragments. °· Strain  all urine through the provided strainer. Keep all particulate matter and stones for your health care provider to see. The stone causing the pain may be as small as a grain of salt. It is very important to use the strainer each and every time you pass your urine. The collection of your stone will allow your health care provider to analyze it and verify that a stone has actually passed. The stone analysis will often identify what you can do to reduce the incidence of recurrences. °· Only take over-the-counter or prescription medicines for pain, discomfort, or fever as directed by your health care provider. °· Make a follow-up appointment with your health care provider as directed. °· Get follow-up X-rays if required. The absence of pain does not always mean that the stone has passed. It may have only stopped moving. If the urine remains completely obstructed, it can cause loss of kidney function or even complete destruction of the kidney. It is your responsibility to make sure X-rays and follow-ups are completed. Ultrasounds of the kidney can show blockages and the status of the kidney. Ultrasounds are not associated with any radiation and can be performed easily in a matter of minutes. °SEEK MEDICAL CARE IF: °· You experience pain that is progressive and unresponsive to any pain medicine you have been prescribed. °SEEK IMMEDIATE MEDICAL CARE IF:  °· Pain cannot be controlled with the prescribed medicine. °· You have a fever or shaking chills. °· The severity or intensity of pain increases over 18 hours and is not relieved by pain medicine. °· You develop a new onset of abdominal pain. °· You feel faint or pass out. °·   You are unable to urinate. MAKE SURE YOU:   Understand these instructions.  Will watch your condition.  Will get help right away if you are not doing well or get worse. Document Released: 12/06/2005 Document Revised: 08/08/2013 Document Reviewed: 05/09/2013 [facility] Patient Information 2015  [place]. This information is not intended to replace advice given to you by your health care provider. Make sure you discuss any questions you have with your health care provider.   Do not drive within 4 hours of taking Percocet as his medication will make you drowsy.  Also make sure you get the Flomax prescription filled that you were provided at last visit.  Keep your appointment with Dr. Mena GoesEskridge as planned.  Get rechecked immediately for uncontrolled vomiting or fever.  Do not take any non-steroidal medicines which include Advil, Motrin, ibuprofen or Aleve.  Also avoid aspirin between now and your lithotripsy appointment.

## 2015-07-01 ENCOUNTER — Telehealth: Payer: Self-pay | Admitting: Family Medicine

## 2015-07-01 ENCOUNTER — Other Ambulatory Visit: Payer: Self-pay | Admitting: Urology

## 2015-07-01 DIAGNOSIS — N201 Calculus of ureter: Secondary | ICD-10-CM

## 2015-07-01 NOTE — Telephone Encounter (Signed)
Pt calling to say he would like his referral to [facility] to be renewed Needs this done by this Friday he has an appt Monday with them.   Dr Jerilee FieldMatthew Eskridge   Call pt when done

## 2015-07-01 NOTE — Telephone Encounter (Signed)
Let's do 

## 2015-07-02 NOTE — Telephone Encounter (Signed)
Order for referral put in. Pt notified on voicemail.  

## 2015-07-02 NOTE — ED Provider Notes (Signed)
CSN: [id number]     Arrival date & time 06/30/15  0930 History   First MD Initiated Contact with Patient 06/30/15 [phone]     Chief Complaint  Patient presents with  . Flank Pain     (Consider location/radiation/quality/duration/timing/severity/associated sxs/prior Treatment) The history is provided by the patient and a relative.   Thomas Howell is a 25 y.o. male with a history significant for kidney stones, followed by Dr. Mena Goes, most recently having lithotripsy 1/16 presenting with persistent and worsened left flank pain and continued hematuria despite a Ct scan here 4 days ago negative for ureteral stones (but multiple renal stones).  He has persistent nausea which he reports is due to pain, denies fevers, vomiting, sob, cough and has been able to maintain PO intake. He denies lower abdomen or scrotal pain.  He has been taking tramadol and zofran without relief of symptoms. Additionally he was prescribed flomax at his last visit here which he has not filled yet.  He was planning to contact his urologist today, but his pain was too intense to wait for an appointment.     Past Medical History  Diagnosis Date  . Migraine   . Complication of anesthesia   . PONV (postoperative nausea and vomiting)   . Left ureteral calculus   . Left nephrolithiasis   . Wears glasses    Past Surgical History  Procedure Laterality Date  . Foot capsule release w/ percutaneous heel cord lengthening, tibial tendon transfer Left 1994    clubfoot  . Lymph gland excision  2003    neck--  benign  . Cystoscopy with retrograde pyelogram, ureteroscopy and stent placement Left 06/24/2014    Procedure: CYSTOSCOPY WITH RETROGRADE PYELOGRAM,  AND STENT PLACEMENT;  Surgeon: Magdalene Molly, MD;  Location: [facility];  Service: Urology;  Laterality: Left;  . Cystoscopy with retrograde pyelogram, ureteroscopy and stent placement Left 07/05/2014    Procedure: CYSTO/LEFT URETEROSCOPY/LEFT RETROGRADE PYELOGRAM/LEFT STENT  PLACEMENT;  Surgeon: Jerilee Field, MD;  Location: [facility];  Service: Urology;  Laterality: Left;  . Holmium laser application Left 07/05/2014    Procedure: LASER LITHO;  Surgeon: Jerilee Field, MD;  Location: [facility];  Service: Urology;  Laterality: Left;   History reviewed. No pertinent family history. History  Substance Use Topics  . Smoking status: Never Smoker   . Smokeless tobacco: Never Used  . Alcohol Use: 0.0 oz/week    0 Standard drinks or equivalent per week     Comment: occ    Review of Systems  Constitutional: Negative for fever and chills.  HENT: Negative for congestion and sore throat.   Eyes: Negative.   Respiratory: Negative for chest tightness and shortness of breath.   Cardiovascular: Negative for chest pain.  Gastrointestinal: Positive for nausea. Negative for abdominal pain.  Genitourinary: Positive for hematuria and flank pain. Negative for testicular pain.  Musculoskeletal: Negative for joint swelling, arthralgias and neck pain.  Skin: Negative.  Negative for rash and wound.  Neurological: Negative for dizziness, weakness, light-headedness, numbness and headaches.  Psychiatric/Behavioral: Negative.       Allergies  Codeine; Other; and Tape  Home Medications   Prior to Admission medications   Medication Sig Start Date End Date Taking? Authorizing Provider  aspirin-acetaminophen-caffeine (EXCEDRIN MIGRAINE) [phone] MG per tablet Take 2 tablets by mouth daily as needed for headache.   Yes Historical Provider, MD  ibuprofen (ADVIL,MOTRIN) 800 MG tablet Take 1 tablet (800 mg total) by mouth 3 (three)  times daily. 06/26/15  Yes Glynn OctaveStephen Rancour, MD  ondansetron (ZOFRAN) 4 MG tablet Take 1 tablet (4 mg total) by mouth every 6 (six) hours. 06/26/15  Yes Glynn OctaveStephen Rancour, MD  rizatriptan (MAXALT-MLT) 10 MG disintegrating tablet Take 1 tablet (10 mg total) by mouth as needed for migraine. May repeat in 2 hours if needed; max  2 per 24 hrs 04/15/15  Yes Campbell Richesarolyn C Hoskins, NP  tamsulosin (FLOMAX) 0.4 MG CAPS capsule Take 1 capsule (0.4 mg total) by mouth daily. 06/26/15  Yes Glynn OctaveStephen Rancour, MD  topiramate (TOPAMAX) 100 MG tablet One po qhs Patient taking differently: Take 100 mg by mouth at bedtime. One po qhs 05/02/15  Yes Campbell Richesarolyn C Hoskins, NP  diphenhydrAMINE (BENADRYL) 25 MG tablet Take 2 tablets (50 mg total) by mouth every 6 (six) hours as needed for itching or allergies. Patient not taking: Reported on 06/26/2015 04/01/15   Paula LibraJohn Molpus, MD  fluticasone [facility](FLONASE) 50 MCG/ACT nasal spray Place 2 sprays into both nostrils daily. Patient not taking: Reported on 06/26/2015 02/17/15   Campbell Richesarolyn C Hoskins, NP  oxyCODONE-acetaminophen (PERCOCET/ROXICET) 5-325 MG per tablet Take 1 tablet by mouth every 4 (four) hours as needed. 06/30/15   Burgess AmorJulie Jyra Lagares, PA-C  promethazine (PHENERGAN) 25 MG tablet Take 1 tablet (25 mg total) by mouth every 6 (six) hours as needed for nausea or vomiting. 06/30/15   Burgess AmorJulie Zophia Marrone, PA-C   BP 113/62 mmHg  Pulse 63  Temp(Src) 98 F (36.7 C) (Oral)  Resp 18  Ht 5\' 9"  (1.753 m)  Wt 185 lb (83.915 kg)  BMI 27.31 kg/m2  SpO2 100% Physical Exam  Constitutional: He appears well-developed and well-nourished.  HENT:  Head: Normocephalic and atraumatic.  Eyes: Conjunctivae are normal.  Neck: Normal range of motion.  Cardiovascular: Normal rate, regular rhythm, normal heart sounds and intact distal pulses.   Pulmonary/Chest: Effort normal and breath sounds normal. He has no wheezes.  Abdominal: Soft. Bowel sounds are normal. There is no splenomegaly or hepatomegaly. There is tenderness in the left upper quadrant. There is CVA tenderness. There is no rigidity and no guarding.  Musculoskeletal: Normal range of motion.  Neurological: He is alert.  Skin: Skin is warm and dry.  Psychiatric: He has a normal mood and affect.  Nursing note and vitals reviewed.   ED Course  Procedures (including critical care  time) Labs Review Labs Reviewed  URINALYSIS, ROUTINE W REFLEX MICROSCOPIC (NOT [facility]) - Abnormal; Notable for the following:    Color, Urine RED (*)    APPearance CLOUDY (*)    Hgb urine dipstick LARGE (*)    Protein, ur TRACE (*)    All other components within normal limits  COMPREHENSIVE METABOLIC PANEL - Abnormal; Notable for the following:    AST 49 (*)    All other components within normal limits  CBC WITH DIFFERENTIAL/PLATELET  URINE MICROSCOPIC-ADD ON    Imaging Review Dg Abd 1 View  06/30/2015   CLINICAL DATA:  Left-sided flank pain for 2 days.  Nausea.  EXAM: ABDOMEN - 1 VIEW  COMPARISON:  CT scan dated 06/26/2015  FINDINGS: There is 7 mm oblong stone in the mid left ureter at the L3-4 level, new since the prior exam. There are multiple stones in the left kidney. Small stone in the lower pole of the right kidney.  Bowel gas pattern is normal.  Stable lumbar scoliosis.  IMPRESSION: New 7 mm stone in the mid left ureter.  Multiple renal calculi.   Electronically Signed  By: Francene Boyers M.D.   On: 06/30/2015 13:42     EKG Interpretation None      MDM   Final diagnoses:  Ureteral stone  Renal stones  Hematuria    Patients labs and/or radiological studies were reviewed and considered during the medical decision making and disposition process.  Results were also discussed with patient.  Findings discussed with Dr. Mena Goes who also spoke to the patient while here.  He is being scheduled for lithotripsy this week (in 3 days).  In the interim, advised pt to get his flomax started (was given dose here, advised next dose tomorrow).  Increased pain medicine to oxycodone.  Phenergan prescribed as pt reports zofran not helpful for his nausea. Encouraged immediate recheck for any fevers, uncontrolled vomiting or worsened pain.  No uti today.  F/u per Dr. Estil Daft instructions.    Burgess Amor, PA-C 07/02/15 1610  Blane Ohara, MD 07/04/15 [phone]

## 2015-07-03 ENCOUNTER — Encounter (HOSPITAL_COMMUNITY): Payer: Self-pay | Admitting: *Deleted

## 2015-07-04 ENCOUNTER — Emergency Department (HOSPITAL_COMMUNITY)
Admission: EM | Admit: 2015-07-04 | Discharge: 2015-07-05 | Disposition: A | Discharge status: Home / Self Care | Payer: 59 | Attending: Emergency Medicine | Admitting: Emergency Medicine

## 2015-07-04 ENCOUNTER — Encounter (HOSPITAL_COMMUNITY): Payer: Self-pay

## 2015-07-04 DIAGNOSIS — N200 Calculus of kidney: Secondary | ICD-10-CM | POA: Diagnosis not present

## 2015-07-04 DIAGNOSIS — G43909 Migraine, unspecified, not intractable, without status migrainosus: Secondary | ICD-10-CM | POA: Diagnosis not present

## 2015-07-04 DIAGNOSIS — Z7951 Long term (current) use of inhaled steroids: Secondary | ICD-10-CM | POA: Insufficient documentation

## 2015-07-04 DIAGNOSIS — Z791 Long term (current) use of non-steroidal anti-inflammatories (NSAID): Secondary | ICD-10-CM | POA: Insufficient documentation

## 2015-07-04 DIAGNOSIS — Z79899 Other long term (current) drug therapy: Secondary | ICD-10-CM | POA: Insufficient documentation

## 2015-07-04 DIAGNOSIS — R109 Unspecified abdominal pain: Secondary | ICD-10-CM | POA: Diagnosis present

## 2015-07-04 IMAGING — CR DG ABDOMEN 1V
1 series · 1 of 1 positions shown · non-contrast
Comparison: 12/24/2014, 06/21/2014.

CLINICAL DATA: Preoperative examination- for left ureteral calculi.

EXAM:
ABDOMEN - 1 VIEW

[t abdomen supine]
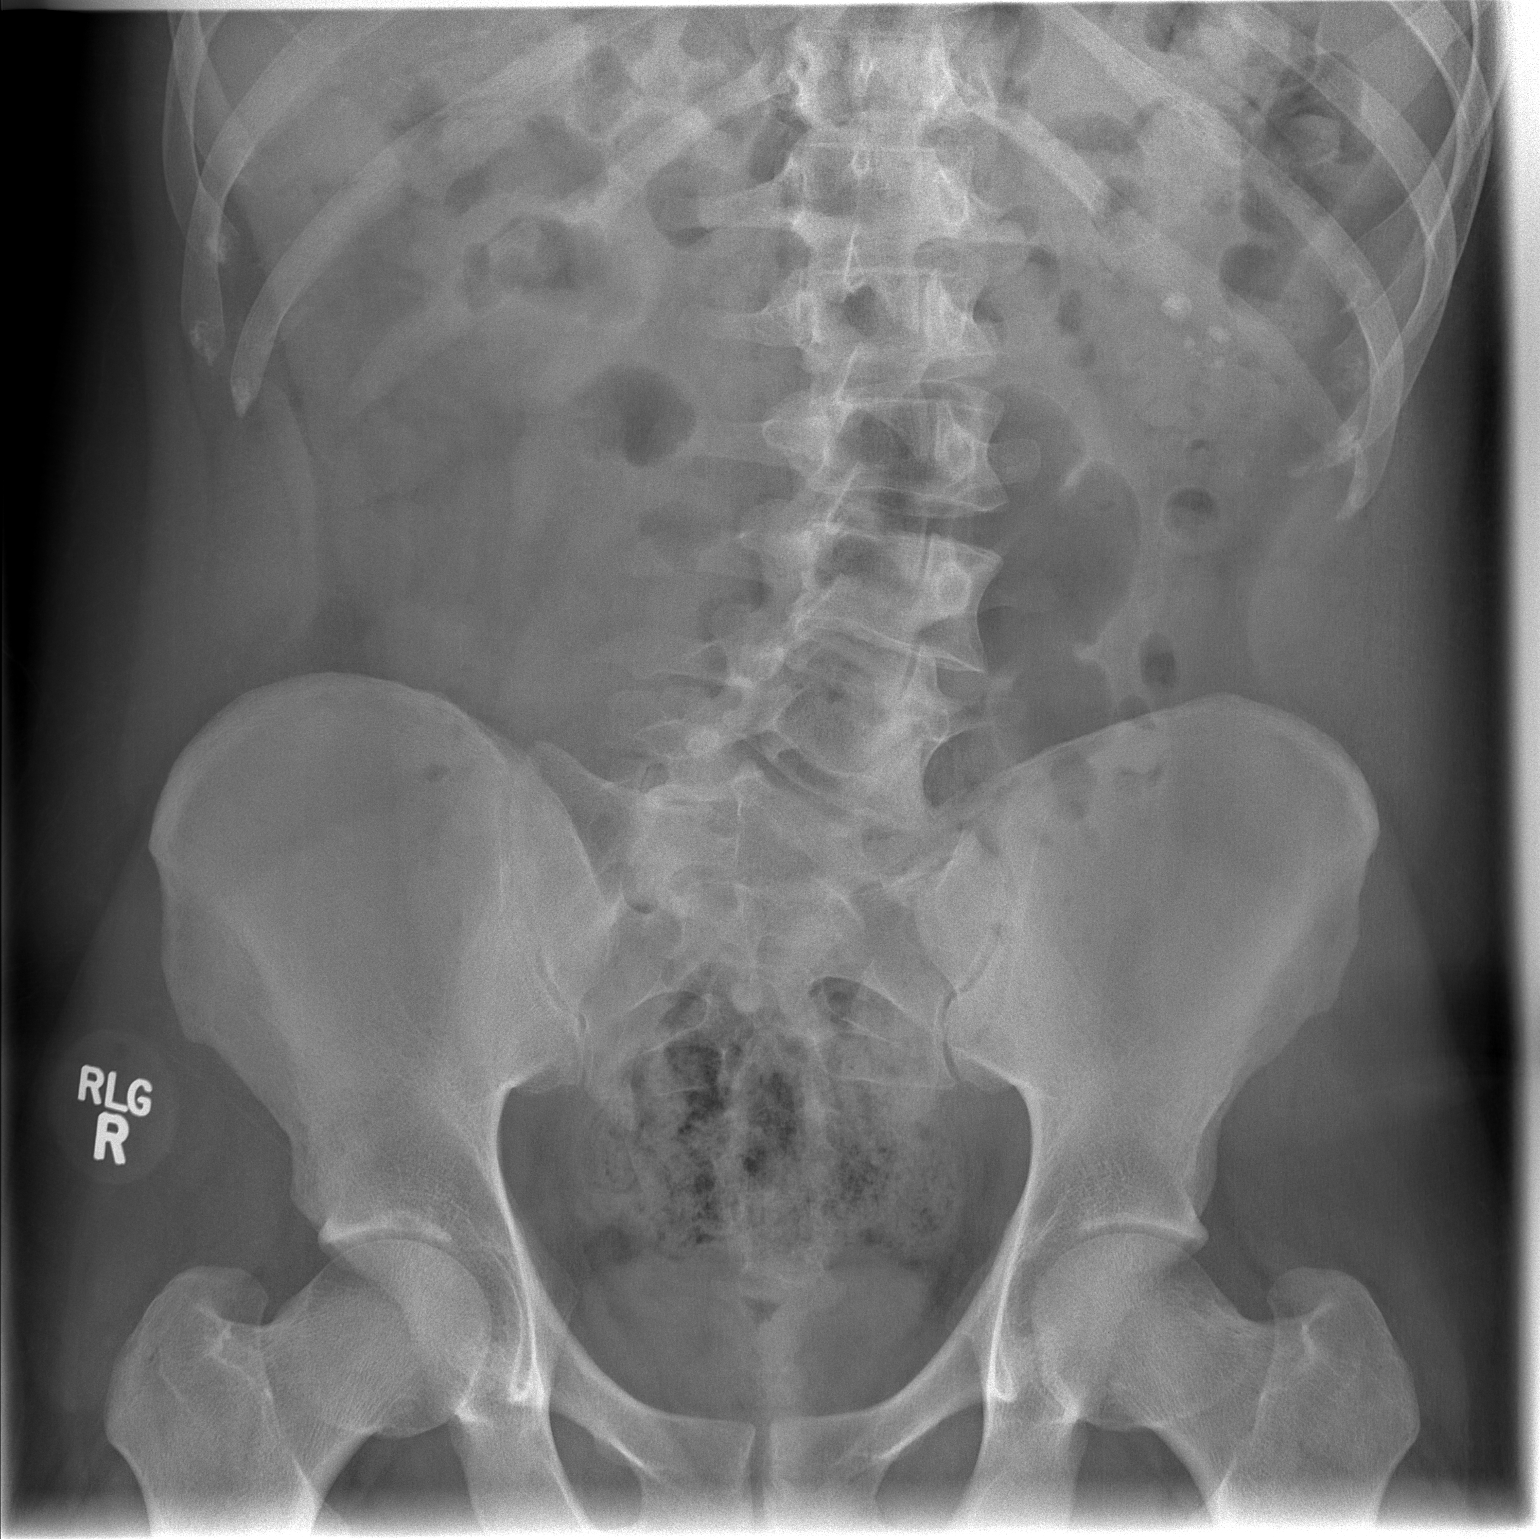

[1 of 1 positions shown; findings below may reference images not displayed]

FINDINGS: The presumed left ureteral calculi identified on the examination 2
days ago at [HOSPITAL] are no longer visualized
and may have passed in the interval. Multiple left lower pole renal
calculi, the largest approximating 9 mm, are unchanged. Bowel gas
pattern unremarkable. Lumbar scoliosis convex left again noted.
IMPRESSION: Left ureteral calculi identified on the examination 2 days ago are
no longer visible and have presumably passed in the interval.
Multiple left lower pole renal calculi, unchanged. No acute
abdominal abnormality.

## 2015-07-04 MED ORDER — ONDANSETRON HCL 4 MG/2ML IJ SOLN
4.0000 mg | Freq: Once | INTRAMUSCULAR | Status: AC
  Administered 2015-07-05: 4 mg via INTRAVENOUS
  Filled 2015-07-04: qty 2

## 2015-07-04 MED ORDER — KETOROLAC TROMETHAMINE 30 MG/ML IJ SOLN
30.0000 mg | Freq: Once | INTRAMUSCULAR | Status: AC
  Administered 2015-07-05: 30 mg via INTRAVENOUS
  Filled 2015-07-04: qty 1

## 2015-07-04 MED ORDER — SODIUM CHLORIDE 0.9 % IV SOLN
INTRAVENOUS | Status: AC
  Administered 2015-07-05: via INTRAVENOUS

## 2015-07-04 NOTE — ED Notes (Signed)
Pt has known 7mm kidney stone on the left, is scheduled for lithotrypsy on Monday, states the pain is unbearable despite pain med taken.  Seen here twice for same.

## 2015-07-04 NOTE — ED Provider Notes (Signed)
CSN: [id number]     Arrival date & time 07/04/15  2315 History   First MD Initiated Contact with Patient 07/04/15 2334     Chief Complaint  Patient presents with  . Flank Pain     (Consider location/radiation/quality/duration/timing/severity/associated sxs/prior Treatment) HPI  Thomas Howell is a 24 y.o. male who presents to the ED with left flank pain. He has had pain for the past week and is scheduled [facility] on [facility]. He reports that he has been taking his Phenergan and Percocet but tonight about 8 pm he started vomiting and the pain became severe. He has not been able to keep down his Phenergan or his Percocet.    Past Medical History  Diagnosis Date  . Migraine   . Complication of anesthesia   . PONV (postoperative nausea and vomiting)   . Left ureteral calculus   . Left nephrolithiasis   . Wears glasses    Past Surgical History  Procedure Laterality Date  . Foot capsule release w/ percutaneous heel cord lengthening, tibial tendon transfer Left 1994    clubfoot  . Lymph gland excision  2003    neck--  benign  . Cystoscopy with retrograde pyelogram, ureteroscopy and stent placement Left 06/24/2014    Procedure: CYSTOSCOPY WITH RETROGRADE PYELOGRAM,  AND STENT PLACEMENT;  Surgeon: Magdalene Mollyaniel Y Woodruff, MD;  Location: [facility];  Service: Urology;  Laterality: Left;  . Cystoscopy with retrograde pyelogram, ureteroscopy and stent placement Left 07/05/2014    Procedure: CYSTO/LEFT URETEROSCOPY/LEFT RETROGRADE PYELOGRAM/LEFT STENT PLACEMENT;  Surgeon: Jerilee FieldMatthew Eskridge, MD;  [facility];  Service: Urology;  Laterality: Left;  . Holmium laser application Left 07/05/2014    Procedure: LASER LITHO;  Surgeon: Jerilee FieldMatthew Eskridge, MD;  [facility];  Service: Urology;  Laterality: Left;   No family history on file. History  Substance Use Topics  . Smoking status: Never Smoker   . Smokeless tobacco: Never Used  .  Alcohol Use: 0.0 oz/week    0 Standard drinks or equivalent per week     Comment: occ    Review of Systems  Gastrointestinal: Positive for nausea, vomiting and abdominal pain.  Genitourinary: Positive for flank pain.  all other systems negative    Allergies  Codeine; Other; and Tape  Home Medications   Prior to Admission medications   Medication Sig Start Date End Date Taking? Authorizing Provider  aspirin-acetaminophen-caffeine (EXCEDRIN MIGRAINE) 6148297846250-250-65 MG per tablet Take 2 tablets by mouth daily as needed for headache.    Historical Provider, MD  diphenhydrAMINE (BENADRYL) 25 MG tablet Take 2 tablets (50 mg total) by mouth every 6 (six) hours as needed for itching or allergies. Patient not taking: Reported on 06/26/2015 04/01/15   Paula LibraJohn Molpus, MD  fluticasone [facility](FLONASE) 50 MCG/ACT nasal spray Place 2 sprays into both nostrils daily. Patient not taking: Reported on 06/26/2015 02/17/15   Campbell Richesarolyn C Hoskins, NP  ibuprofen (ADVIL,MOTRIN) 800 MG tablet Take 1 tablet (800 mg total) by mouth 3 (three) times daily. 06/26/15   Glynn OctaveStephen Rancour, MD  ondansetron (ZOFRAN) 4 MG tablet Take 1 tablet (4 mg total) by mouth every 6 (six) hours. 07/05/15   Chapman Matteucci Orlene OchM Hughes Wyndham, NP  oxyCODONE-acetaminophen (PERCOCET/ROXICET) 5-325 MG per tablet Take 1 tablet by mouth every 4 (four) hours as needed. 06/30/15   Burgess AmorJulie Idol, PA-C  promethazine (PHENERGAN) 25 MG tablet Take 1 tablet (25 mg total) by mouth every 6 (six) hours as needed for nausea or  vomiting. 06/30/15   Burgess Amor, PA-C  rizatriptan (MAXALT-MLT) 10 MG disintegrating tablet Take 1 tablet (10 mg total) by mouth as needed for migraine. May repeat in 2 hours if needed; max 2 per 24 hrs 04/15/15   Campbell Riches, NP  tamsulosin (FLOMAX) 0.4 MG CAPS capsule Take 1 capsule (0.4 mg total) by mouth daily. 06/26/15   Glynn Octave, MD  topiramate (TOPAMAX) 100 MG tablet One po qhs Patient taking differently: Take 100 mg by mouth at bedtime. One po qhs 05/02/15    Campbell Riches, NP   BP 141/72 mmHg  Pulse 77  Temp(Src) 98 F (36.7 C) (Oral)  Resp 18  Ht  (1.727 m)  Wt 185 lb (83.915 kg)  BMI 28.14 kg/m2  SpO2 99% Physical Exam  Constitutional: He is oriented to person, place, and time. He appears well-developed and well-nourished. No distress.  HENT:  Head: Normocephalic and atraumatic.  Eyes: EOM are normal.  Neck: Neck supple.  Cardiovascular: Normal rate.   Pulmonary/Chest: Effort normal and breath sounds normal.  Abdominal: Soft. Bowel sounds are normal. There is tenderness in the left lower quadrant. There is no rebound and no guarding.  Left flank tenderness  Musculoskeletal: Normal range of motion.  Neurological: He is alert and oriented to person, place, and time. No cranial nerve deficit.  Skin: Skin is warm and dry.  Psychiatric: He has a normal mood and affect. His behavior is normal.  Nursing note and vitals reviewed.   ED Course  Procedures (including critical care time)  IV hydration NS 1,000 ccs. Zofran 4 mg IV, Toradol 30 mg IV  Patient feeling better after medications, no nausea, continues to have some left flank pain.  Dilaudid 1 mg IV given Urine sent to lab.   Labs Review  Imaging Review No results found.   MDM  24 y.o. male with known kidney stone and scheduled for lithotripsy 07/07/15 improved with medications and IV fluids.  Will give Rx for Zofran ODT and he will continue his home medications as directed.  U/A pending. Dr. Lynelle Doctor to review results and make final disposition of patient.    Final diagnoses:  Kidney stone        [facility], NP 07/05/15 1610  Devoria Albe, MD 07/05/15 [phone]

## 2015-07-05 LAB — URINALYSIS, ROUTINE W REFLEX MICROSCOPIC
Bilirubin Urine: NEGATIVE
Glucose, UA: NEGATIVE mg/dL
Ketones, ur: NEGATIVE mg/dL
Leukocytes, UA: NEGATIVE
Nitrite: NEGATIVE
Protein, ur: NEGATIVE mg/dL
Specific Gravity, Urine: 1.015 (ref 1.005–1.030)
Urobilinogen, UA: 0.2 mg/dL (ref 0.0–1.0)
pH: 7 (ref 5.0–8.0)

## 2015-07-05 LAB — URINE MICROSCOPIC-ADD ON

## 2015-07-05 MED ORDER — ONDANSETRON HCL 4 MG PO TABS: 4.0000 mg | ORAL_TABLET | Freq: Four times a day (QID) | ORAL | Status: DC

## 2015-07-05 MED ORDER — HYDROMORPHONE HCL 1 MG/ML IJ SOLN
1.0000 mg | Freq: Once | INTRAMUSCULAR | Status: AC
  Administered 2015-07-05: 1 mg via INTRAVENOUS
  Filled 2015-07-05: qty 1

## 2015-07-05 NOTE — ED Notes (Signed)
Discharge instructions given, pt demonstrated teach back and verbal understanding. No concerns voiced.  

## 2015-07-06 ENCOUNTER — Emergency Department (HOSPITAL_COMMUNITY): Payer: 59

## 2015-07-06 ENCOUNTER — Encounter (HOSPITAL_COMMUNITY): Payer: Self-pay | Admitting: Emergency Medicine

## 2015-07-06 ENCOUNTER — Encounter (HOSPITAL_COMMUNITY)
Admission: EM | Disposition: A | Discharge status: Home / Self Care | Payer: Self-pay | Source: Home / Self Care | Attending: Urology

## 2015-07-06 ENCOUNTER — Inpatient Hospital Stay (HOSPITAL_COMMUNITY): Payer: 59 | Admitting: Certified Registered"

## 2015-07-06 ENCOUNTER — Observation Stay (HOSPITAL_COMMUNITY)
Admission: EM | Admit: 2015-07-06 | Discharge: 2015-07-07 | Disposition: A | Discharge status: Home / Self Care | Payer: 59 | Attending: Urology | Admitting: Urology

## 2015-07-06 DIAGNOSIS — R52 Pain, unspecified: Secondary | ICD-10-CM | POA: Diagnosis present

## 2015-07-06 DIAGNOSIS — N201 Calculus of ureter: Secondary | ICD-10-CM | POA: Diagnosis present

## 2015-07-06 DIAGNOSIS — N179 Acute kidney failure, unspecified: Secondary | ICD-10-CM | POA: Diagnosis not present

## 2015-07-06 DIAGNOSIS — G43909 Migraine, unspecified, not intractable, without status migrainosus: Secondary | ICD-10-CM | POA: Diagnosis not present

## 2015-07-06 DIAGNOSIS — Z79899 Other long term (current) drug therapy: Secondary | ICD-10-CM | POA: Diagnosis not present

## 2015-07-06 DIAGNOSIS — N200 Calculus of kidney: Secondary | ICD-10-CM

## 2015-07-06 DIAGNOSIS — N202 Calculus of kidney with calculus of ureter: Principal | ICD-10-CM | POA: Insufficient documentation

## 2015-07-06 DIAGNOSIS — Z87442 Personal history of urinary calculi: Secondary | ICD-10-CM | POA: Insufficient documentation

## 2015-07-06 HISTORY — PX: CYSTOSCOPY W/ RETROGRADES: SHX1426

## 2015-07-06 HISTORY — PX: CYSTOSCOPY WITH RETROGRADE PYELOGRAM, URETEROSCOPY AND STENT PLACEMENT: SHX5789

## 2015-07-06 LAB — CBC WITH DIFFERENTIAL/PLATELET
Basophils Absolute: 0 10*3/uL (ref 0.0–0.1)
Basophils Relative: 0 % (ref 0–1)
Eosinophils Absolute: 0 10*3/uL (ref 0.0–0.7)
Eosinophils Relative: 0 % (ref 0–5)
HCT: 43.5 % (ref 39.0–52.0)
Hemoglobin: 15.4 g/dL (ref 13.0–17.0)
Lymphocytes Relative: 11 % — ABNORMAL LOW (ref 12–46)
Lymphs Abs: 1.2 10*3/uL (ref 0.7–4.0)
MCH: 31.1 pg (ref 26.0–34.0)
MCHC: 35.4 g/dL (ref 30.0–36.0)
MCV: 87.9 fL (ref 78.0–100.0)
Monocytes Absolute: 0.7 10*3/uL (ref 0.1–1.0)
Monocytes Relative: 6 % (ref 3–12)
Neutro Abs: 9.4 10*3/uL — ABNORMAL HIGH (ref 1.7–7.7)
Neutrophils Relative %: 83 % — ABNORMAL HIGH (ref 43–77)
Platelets: 186 10*3/uL (ref 150–400)
RBC: 4.95 MIL/uL (ref 4.22–5.81)
RDW: 12.1 % (ref 11.5–15.5)
WBC: 11.3 10*3/uL — ABNORMAL HIGH (ref 4.0–10.5)

## 2015-07-06 LAB — URINE MICROSCOPIC-ADD ON

## 2015-07-06 LAB — URINALYSIS, ROUTINE W REFLEX MICROSCOPIC
Bilirubin Urine: NEGATIVE
Glucose, UA: NEGATIVE mg/dL
Ketones, ur: NEGATIVE mg/dL
Nitrite: NEGATIVE
Protein, ur: 100 mg/dL — AB
Specific Gravity, Urine: 1.012 (ref 1.005–1.030)
Urobilinogen, UA: 0.2 mg/dL (ref 0.0–1.0)
pH: 6 (ref 5.0–8.0)

## 2015-07-06 LAB — BASIC METABOLIC PANEL
Anion gap: 9 (ref 5–15)
BUN: 23 mg/dL — ABNORMAL HIGH (ref 6–20)
CO2: 21 mmol/L — ABNORMAL LOW (ref 22–32)
Calcium: 9.6 mg/dL (ref 8.9–10.3)
Chloride: 105 mmol/L (ref 101–111)
Creatinine, Ser: 2.22 mg/dL — ABNORMAL HIGH (ref 0.61–1.24)
GFR calc Af Amer: 46 mL/min — ABNORMAL LOW (ref >60)
GFR calc non Af Amer: 40 mL/min — ABNORMAL LOW (ref >60)
Glucose, Bld: 89 mg/dL (ref 65–99)
Potassium: 3.8 mmol/L (ref 3.5–5.1)
Sodium: 135 mmol/L (ref 135–145)

## 2015-07-06 SURGERY — CYSTOURETEROSCOPY, WITH RETROGRADE PYELOGRAM AND STENT INSERTION
Anesthesia: General | Laterality: Right

## 2015-07-06 MED ORDER — PROPOFOL 10 MG/ML IV BOLUS
INTRAVENOUS | Status: AC
  Filled 2015-07-06: qty 20

## 2015-07-06 MED ORDER — SODIUM CHLORIDE 0.9 % IV BOLUS (SEPSIS)
1000.0000 mL | Freq: Once | INTRAVENOUS | Status: AC
  Administered 2015-07-06: 1000 mL via INTRAVENOUS

## 2015-07-06 MED ORDER — DEXAMETHASONE SODIUM PHOSPHATE 10 MG/ML IJ SOLN
INTRAMUSCULAR | Status: DC | PRN
  Administered 2015-07-06: 10 mg via INTRAVENOUS

## 2015-07-06 MED ORDER — DIAZEPAM 5 MG PO TABS: 10.0000 mg | ORAL_TABLET | ORAL | Status: DC

## 2015-07-06 MED ORDER — LACTATED RINGERS IV SOLN: INTRAVENOUS | Status: DC

## 2015-07-06 MED ORDER — CEFAZOLIN SODIUM-DEXTROSE 2-3 GM-% IV SOLR
INTRAVENOUS | Status: AC
  Filled 2015-07-06: qty 50

## 2015-07-06 MED ORDER — HYDROMORPHONE HCL 1 MG/ML IJ SOLN
INTRAMUSCULAR | Status: AC
  Filled 2015-07-06: qty 1

## 2015-07-06 MED ORDER — MORPHINE SULFATE 4 MG/ML IJ SOLN
4.0000 mg | Freq: Once | INTRAMUSCULAR | Status: AC
  Administered 2015-07-06: 4 mg via INTRAVENOUS
  Filled 2015-07-06: qty 1

## 2015-07-06 MED ORDER — SODIUM CHLORIDE 0.9 % IR SOLN
Status: DC | PRN
  Administered 2015-07-06: 3000 mL via INTRAVESICAL
  Administered 2015-07-06: 1000 mL via INTRAVESICAL

## 2015-07-06 MED ORDER — CEFAZOLIN SODIUM-DEXTROSE 2-3 GM-% IV SOLR
2.0000 g | INTRAVENOUS | Status: AC
  Administered 2015-07-06: 2 g via INTRAVENOUS
  Filled 2015-07-06: qty 50

## 2015-07-06 MED ORDER — FENTANYL CITRATE (PF) 100 MCG/2ML IJ SOLN
INTRAMUSCULAR | Status: DC | PRN
  Administered 2015-07-06 (×2): 50 ug via INTRAVENOUS

## 2015-07-06 MED ORDER — SODIUM CHLORIDE 0.9 % IV SOLN: INTRAVENOUS | Status: DC

## 2015-07-06 MED ORDER — LIDOCAINE HCL (CARDIAC) 20 MG/ML IV SOLN
INTRAVENOUS | Status: AC
  Filled 2015-07-06: qty 5

## 2015-07-06 MED ORDER — DIPHENHYDRAMINE HCL 25 MG PO CAPS: 25.0000 mg | ORAL_CAPSULE | ORAL | Status: DC

## 2015-07-06 MED ORDER — CIPROFLOXACIN HCL 500 MG PO TABS: 500.0000 mg | ORAL_TABLET | ORAL | Status: DC

## 2015-07-06 MED ORDER — IOHEXOL 350 MG/ML SOLN
INTRAVENOUS | Status: DC | PRN
  Administered 2015-07-06: 50 mL via URETHRAL

## 2015-07-06 MED ORDER — FENTANYL CITRATE (PF) 100 MCG/2ML IJ SOLN
INTRAMUSCULAR | Status: AC
  Filled 2015-07-06: qty 2

## 2015-07-06 MED ORDER — MIDAZOLAM HCL 5 MG/5ML IJ SOLN
INTRAMUSCULAR | Status: DC | PRN
  Administered 2015-07-06: 2 mg via INTRAVENOUS

## 2015-07-06 MED ORDER — TAMSULOSIN HCL 0.4 MG PO CAPS: 0.4000 mg | ORAL_CAPSULE | Freq: Every day | ORAL | Status: DC

## 2015-07-06 MED ORDER — DEXAMETHASONE SODIUM PHOSPHATE 10 MG/ML IJ SOLN
INTRAMUSCULAR | Status: AC
  Filled 2015-07-06: qty 1

## 2015-07-06 MED ORDER — FENTANYL CITRATE (PF) 100 MCG/2ML IJ SOLN
25.0000 ug | INTRAMUSCULAR | Status: DC | PRN
  Administered 2015-07-06 – 2015-07-07 (×2): 50 ug via INTRAVENOUS

## 2015-07-06 MED ORDER — HYDROMORPHONE HCL 1 MG/ML IJ SOLN
INTRAMUSCULAR | Status: DC | PRN
  Administered 2015-07-06 (×2): 0.5 mg via INTRAVENOUS

## 2015-07-06 MED ORDER — FENTANYL CITRATE (PF) 100 MCG/2ML IJ SOLN
50.0000 ug | Freq: Once | INTRAMUSCULAR | Status: AC
  Administered 2015-07-06: 50 ug via INTRAVENOUS
  Filled 2015-07-06: qty 2

## 2015-07-06 MED ORDER — PROPOFOL 10 MG/ML IV BOLUS
INTRAVENOUS | Status: DC | PRN
  Administered 2015-07-06: 200 mg via INTRAVENOUS

## 2015-07-06 MED ORDER — LIDOCAINE HCL (PF) 2 % IJ SOLN
INTRAMUSCULAR | Status: DC | PRN
  Administered 2015-07-06: 40 mg via INTRADERMAL

## 2015-07-06 MED ORDER — MIDAZOLAM HCL 2 MG/2ML IJ SOLN
INTRAMUSCULAR | Status: AC
  Filled 2015-07-06: qty 2

## 2015-07-06 MED ORDER — HYDROMORPHONE HCL 1 MG/ML IJ SOLN
0.5000 mg | Freq: Once | INTRAMUSCULAR | Status: AC
  Administered 2015-07-06: 0.5 mg via INTRAVENOUS
  Filled 2015-07-06: qty 1

## 2015-07-06 MED ORDER — LACTATED RINGERS IV SOLN
INTRAVENOUS | Status: DC | PRN
Start: 2015-07-06 — End: 2015-07-06
  Administered 2015-07-06: 22:00:00 via INTRAVENOUS

## 2015-07-06 MED ORDER — OXYCODONE-ACETAMINOPHEN 5-325 MG PO TABS: 1.0000 | ORAL_TABLET | Freq: Four times a day (QID) | ORAL | Status: DC | PRN

## 2015-07-06 MED ORDER — ONDANSETRON HCL 4 MG/2ML IJ SOLN
4.0000 mg | Freq: Once | INTRAMUSCULAR | Status: AC
  Administered 2015-07-06: 4 mg via INTRAVENOUS
  Filled 2015-07-06: qty 2

## 2015-07-06 SURGICAL SUPPLY — 28 items
BAG URO CATCHER STRL LF (DRAPE) ×3 IMPLANT
BASKET LASER NITINOL 1.9FR (BASKET) IMPLANT
BASKET STNLS GEMINI 4WIRE 3FR (BASKET) IMPLANT
BASKET ZERO TIP NITINOL 2.4FR (BASKET) IMPLANT
BRUSH URET BIOPSY 3F (UROLOGICAL SUPPLIES) IMPLANT
CATH INTERMIT  6FR 70CM (CATHETERS) IMPLANT
CATH URET 5FR 28IN OPEN ENDED (CATHETERS) ×3 IMPLANT
CLOTH BEACON ORANGE TIMEOUT ST (SAFETY) ×3 IMPLANT
EXTRACTOR STONE NITINOL NGAGE (UROLOGICAL SUPPLIES) ×3 IMPLANT
FIBER LASER FLEXIVA 1000 (UROLOGICAL SUPPLIES) IMPLANT
FIBER LASER FLEXIVA 200 (UROLOGICAL SUPPLIES) ×3 IMPLANT
FIBER LASER FLEXIVA 365 (UROLOGICAL SUPPLIES) IMPLANT
FIBER LASER FLEXIVA 550 (UROLOGICAL SUPPLIES) IMPLANT
FIBER LASER TRAC TIP (UROLOGICAL SUPPLIES) IMPLANT
GLOVE BIOGEL M STRL SZ7.5 (GLOVE) ×3 IMPLANT
GOWN STRL REUS W/TWL XL LVL3 (GOWN DISPOSABLE) ×3 IMPLANT
GUIDEWIRE ANG ZIPWIRE 038X150 (WIRE) ×3 IMPLANT
GUIDEWIRE STR DUAL SENSOR (WIRE) IMPLANT
IV NS IRRIG 3000ML ARTHROMATIC (IV SOLUTION) ×6 IMPLANT
KIT BALLIN UROMAX 15FX10 (LABEL) IMPLANT
KIT BALLN UROMAX 15FX4 (MISCELLANEOUS) IMPLANT
KIT BALLN UROMAX 26 75X4 (MISCELLANEOUS)
PACK CYSTO (CUSTOM PROCEDURE TRAY) ×6 IMPLANT
SET HIGH PRES BAL DIL (LABEL)
SHEATH ACCESS URETERAL 24CM (SHEATH) IMPLANT
SHEATH ACCESS URETERAL 54CM (SHEATH) ×3 IMPLANT
STENT CONTOUR 6FRX26X.038 (STENTS) ×3 IMPLANT
SYRINGE IRR TOOMEY STRL 70CC (SYRINGE) IMPLANT

## 2015-07-06 NOTE — ED Notes (Signed)
Pt states that he is scheduled to have lithotripsy on his L side tomorrow morning but has been vomiting at home and is unable to keep his oxycodone and phenergan down. Alert and oriented.

## 2015-07-06 NOTE — Progress Notes (Signed)
Patient has a 13 mm left lower pole stone that was difficult to reach with the ureteroscope. We have shockwave planned in the morning which was for the left ureteral stone but this was cleared tonight. I discussed with the patient and his mom the nature risk and benefits of left lower pole extracorporeal shockwave lithotripsy versus simply removing the stent and continued observation. All questions answered and elected to proceed with shockwave lithotripsy in the morning of the left lower pole stone.  I did place a left ureteral stent which will need to stay in place for a few weeks given the edema of the left ureter and the left ureteroscopy.

## 2015-07-06 NOTE — Anesthesia Preprocedure Evaluation (Signed)
Anesthesia Evaluation  Patient identified by MRN, date of birth, ID band Patient awake    Reviewed: Allergy & Precautions, H&P , NPO status , Patient's Chart, lab work & pertinent test results  History of Anesthesia Complications (+) PONV  Airway Mallampati: II  TM Distance: >3 FB Neck ROM: Full    Dental no notable dental hx. (+) Dental Advisory Given, Teeth Intact   Pulmonary neg pulmonary ROS,  breath sounds clear to auscultation  Pulmonary exam normal       Cardiovascular negative cardio ROS Normal cardiovascular examRhythm:Regular Rate:Normal     Neuro/Psych  Headaches, negative neurological ROS  negative psych ROS   GI/Hepatic negative GI ROS, Neg liver ROS,   Endo/Other  negative endocrine ROS  Renal/GU Renal diseasenegative Renal ROSCRT 2.2  negative genitourinary   Musculoskeletal negative musculoskeletal ROS (+)   Abdominal   Peds negative pediatric ROS (+)  Hematology negative hematology ROS (+)   Anesthesia Other Findings   Reproductive/Obstetrics negative OB ROS                             Anesthesia Physical Anesthesia Plan  ASA: III  Anesthesia Plan: General   Post-op Pain Management:    Induction: Intravenous  Airway Management Planned: LMA  Additional Equipment:   Intra-op Plan:   Post-operative Plan:   Informed Consent:   Plan Discussed with: Surgeon  Anesthesia Plan Comments:         Anesthesia Quick Evaluation

## 2015-07-06 NOTE — H&P (Signed)
H&P  Chief Complaint: Kidney stones  History of Present Illness: Patient is a 24 year old white male with history of kidney stones. He was scheduled for shockwave lithotripsy for a left proximal ureteral stone in the morning but he developed severe bilateral flank pain earlier today, nausea and vomiting. His creatinine is elevated. Has not been able to keep medicines down in the emergency department. X-ray passed a small stone in the emergency department and on KUB might of been the right lower pole stone. I don't appreciate a small right lower pole stone on the KUB but I did seated on the prior scout CT. Left proximal stone appears stable. Left lower pole stone appears stable. He's had no dysuria or gross hematuria.  Past Medical History  Diagnosis Date  . Migraine   . Complication of anesthesia   . PONV (postoperative nausea and vomiting)   . Left ureteral calculus   . Left nephrolithiasis   . Wears glasses    Past Surgical History  Procedure Laterality Date  . Foot capsule release w/ percutaneous heel cord lengthening, tibial tendon transfer Left 1994    clubfoot  . Lymph gland excision  2003    neck--  benign  . Cystoscopy with retrograde pyelogram, ureteroscopy and stent placement Left 06/24/2014    Procedure: CYSTOSCOPY WITH RETROGRADE PYELOGRAM,  AND STENT PLACEMENT;  Surgeon: Magdalene Molly, MD;  Location: [facility];  Service: Urology;  Laterality: Left;  . Cystoscopy with retrograde pyelogram, ureteroscopy and stent placement Left 07/05/2014    Procedure: CYSTO/LEFT URETEROSCOPY/LEFT RETROGRADE PYELOGRAM/LEFT STENT PLACEMENT;  Surgeon: Jerilee Field, MD;  Location: [facility];  Service: Urology;  Laterality: Left;  . Holmium laser application Left 07/05/2014    Procedure: LASER LITHO;  Surgeon: Jerilee Field, MD;  Location: [facility];  Service: Urology;  Laterality: Left;    Home Medications:  Prescriptions prior to admission  Medication Sig  Dispense Refill Last Dose  . aspirin-acetaminophen-caffeine (EXCEDRIN MIGRAINE) 250-250-65 MG per tablet Take 2 tablets by mouth daily as needed for headache.   Past Month at Unknown time  . oxyCODONE-acetaminophen (PERCOCET/ROXICET) 5-325 MG per tablet Take 1 tablet by mouth every 4 (four) hours as needed. 30 tablet 0 07/06/2015 at Unknown time  . promethazine (PHENERGAN) 25 MG tablet Take 1 tablet (25 mg total) by mouth every 6 (six) hours as needed for nausea or vomiting. 30 tablet 0 07/06/2015 at Unknown time  . rizatriptan (MAXALT-MLT) 10 MG disintegrating tablet Take 1 tablet (10 mg total) by mouth as needed for migraine. May repeat in 2 hours if needed; max 2 per 24 hrs 6 tablet 2 Past Month at Unknown time  . tamsulosin (FLOMAX) 0.4 MG CAPS capsule Take 1 capsule (0.4 mg total) by mouth daily. 10 capsule 0 07/06/2015 at Unknown time  . topiramate (TOPAMAX) 100 MG tablet One po qhs (Patient taking differently: Take 100 mg by mouth at bedtime. One po qhs) 30 tablet 5 07/05/2015 at 2030  . diphenhydrAMINE (BENADRYL) 25 MG tablet Take 2 tablets (50 mg total) by mouth every 6 (six) hours as needed for itching or allergies. (Patient not taking: Reported on 06/26/2015)  0 Completed Course at Unknown time  . fluticasone (FLONASE) 50 MCG/ACT nasal spray Place 2 sprays into both nostrils daily. (Patient not taking: Reported on 06/26/2015) 16 g 5 Completed Course at Unknown time  . ibuprofen (ADVIL,MOTRIN) 800 MG tablet Take 1 tablet (800 mg total) by mouth 3 (three) times daily. 21 tablet 0 06/29/2015  .  ondansetron (ZOFRAN) 4 MG tablet Take 1 tablet (4 mg total) by mouth every 6 (six) hours. 20 tablet 0    Allergies:  Allergies  Allergen Reactions  . Codeine Swelling    Lips swell  . Other     Hycodan cough syrup - lips swelling  . Tape Rash    Adhesive tape     History reviewed. No pertinent family history. Social History:  reports that he has never smoked. He has never used smokeless tobacco. He  reports that he drinks alcohol. He reports that he does not use illicit drugs.  ROS: A complete review of systems was performed.  All systems are negative except for pertinent findings as noted. ROS   Physical Exam:  Vital signs in last 24 hours: Temp:  [97.9 F (36.6 C)-98 F (36.7 C)] 98 F (36.7 C) (07/17 2108) Pulse Rate:  [84-88] 84 (07/17 2108) Resp:  [18-19] 18 (07/17 2108) BP: (130-136)/(72-74) 130/74 mmHg (07/17 2108) SpO2:  [99 %] 99 % (07/17 2108) Weight:  [83.915 kg (185 lb)] 83.915 kg (185 lb) (07/17 1921) General:  Alert and oriented, No acute distress HEENT: Normocephalic, atraumatic Neck: No JVD or lymphadenopathy Cardiovascular: Regular rate and rhythm Lungs: Regular rate and effort Abdomen: Soft, nontender, nondistended, no abdominal masses Back: No CVA tenderness Extremities: No edema Neurologic: Grossly intact  Laboratory Data:  Results for orders placed or performed during the hospital encounter of 07/06/15 (from the past 24 hour(s))  Basic metabolic panel     Status: Abnormal   Collection Time: 07/06/15  7:48 PM  Result Value Ref Range   Sodium 135 135 - 145 mmol/L   Potassium 3.8 3.5 - 5.1 mmol/L   Chloride 105 101 - 111 mmol/L   CO2 21 (L) 22 - 32 mmol/L   Glucose, Bld 89 65 - 99 mg/dL   BUN 23 (H) 6 - 20 mg/dL   Creatinine, Ser 1.61 (H) 0.61 - 1.24 mg/dL   Calcium 9.6 8.9 - 09.6 mg/dL   GFR calc non Af Amer 40 (L) >60 mL/min   GFR calc Af Amer 46 (L) >60 mL/min   Anion gap 9 5 - 15  CBC with Differential     Status: Abnormal   Collection Time: 07/06/15  7:48 PM  Result Value Ref Range   WBC 11.3 (H) 4.0 - 10.5 K/uL   RBC 4.95 4.22 - 5.81 MIL/uL   Hemoglobin 15.4 13.0 - 17.0 g/dL   HCT 04.5 40.9 - 81.1 %   MCV 87.9 78.0 - 100.0 fL   MCH 31.1 26.0 - 34.0 pg   MCHC 35.4 30.0 - 36.0 g/dL   RDW 91.4 78.2 - 95.6 %   Platelets 186 150 - 400 K/uL   Neutrophils Relative % 83 (H) 43 - 77 %   Neutro Abs 9.4 (H) 1.7 - 7.7 K/uL   Lymphocytes  Relative 11 (L) 12 - 46 %   Lymphs Abs 1.2 0.7 - 4.0 K/uL   Monocytes Relative 6 3 - 12 %   Monocytes Absolute 0.7 0.1 - 1.0 K/uL   Eosinophils Relative 0 0 - 5 %   Eosinophils Absolute 0.0 0.0 - 0.7 K/uL   Basophils Relative 0 0 - 1 %   Basophils Absolute 0.0 0.0 - 0.1 K/uL  Urinalysis, Routine w reflex microscopic (not at [facility])     Status: Abnormal   Collection Time: 07/06/15  8:17 PM  Result Value Ref Range   Color, Urine RED (A) YELLOW  APPearance CLOUDY (A) CLEAR   Specific Gravity, Urine 1.012 1.005 - 1.030   pH 6.0 5.0 - 8.0   Glucose, UA NEGATIVE NEGATIVE mg/dL   Hgb urine dipstick LARGE (A) NEGATIVE   Bilirubin Urine NEGATIVE NEGATIVE   Ketones, ur NEGATIVE NEGATIVE mg/dL   Protein, ur 086100 (A) NEGATIVE mg/dL   Urobilinogen, UA 0.2 0.0 - 1.0 mg/dL   Nitrite NEGATIVE NEGATIVE   Leukocytes, UA TRACE (A) NEGATIVE  Urine microscopic-add on     Status: None   Collection Time: 07/06/15  8:17 PM  Result Value Ref Range   WBC, UA 0-2 <3 WBC/hpf   RBC / HPF TOO NUMEROUS TO COUNT <3 RBC/hpf   Bacteria, UA RARE RARE   No results found for this or any previous visit (from the past 240 hour(s)). Creatinine:  Recent Labs  06/30/15 1002 07/06/15 1948  CREATININE 1.18 2.22*    Impression/Assessment/plan: Bilateral renal stones, left ureteral stone- I discussed with the patient I discussed with the patient the nature, potential benefits, risks and alternatives to bilateral retrograde pyelogram, left ureteral stent, possible bilateral ureteroscopy holmium laser lithotripsy and right ureteral stent, including side effects of the proposed treatment, the likelihood of the patient achieving the goals of the procedure, and any potential problems that might occur during the procedure or recuperation. All questions answered. I be concerned about performing shockwave lithotripsy on the left proximal stone given his renal failure, nausea vomiting and possible passage of a right stone. He  could develop bilateral obstruction. Discussed will make best attempt to clear him of all ureteral stones tonight but is not possible will stent one or both sides and consider shockwave lithotripsy of the left proximal stone in the morning. Patient elects to proceed. We discussed alternatives such as continued observation with possible shockwave lithotripsy in the morning.  Taci Sterling 07/06/2015, 9:59 PM

## 2015-07-06 NOTE — Transfer of Care (Signed)
Immediate Anesthesia Transfer of Care Note  Patient: Thomas Howell  Procedure(s) Performed: Procedure(s): CYSTOSCOPY WITH RETROGRADE PYELOGRAM, URETEROSCOPY , laser, STENT PLACEMENT and basket extraction (Left) CYSTOSCOPY WITH RETROGRADE PYELOGRAM (Right)  Patient Location: PACU  Anesthesia Type:General  Level of Consciousness:   patient cooperative and responds to stimulation  Airway & Oxygen Therapy:Patient Spontanous Breathing and Patient connected to face mask oxgen  Post-op Assessment:  Report given to PACU RN and Post -op Vital signs reviewed and stable  Post vital signs:  Reviewed and stable  Last Vitals:  Filed Vitals:   07/06/15 2108  BP: 130/74  Pulse: 84  Temp: 36.7 C  Resp: 18    Complications: No apparent anesthesia complications

## 2015-07-06 NOTE — Op Note (Signed)
Preoperative diagnosis: Right renal stone, left ureteral stone, left renal stone Postoperative diagnosis: Left ureteral stone, left renal stone  Procedure: Cystoscopy, Right retrograde pyelogram, Left retrograde pyelogram, left ureteroscopy with holmium laser lithotripsy, stone basket extraction, left ureteral stent placement  Surgeon: Mena GoesEskridge  Anesthesia: Gen.  Indication for procedure: Patient's a 24 year old male who is passing the left ureteral stone he then developed severe bilateral flank pain today with nausea and vomiting and an elevated creatinine. He is brought to the operating room for cystoscopy with retrograde pyelograms possible ureteroscopy, holmium laser lithotripsy and stent placement given that he might be passing bilateral ureteral stones. Of note the patient reported passing a sizable stone when he voided in the emergency department prior to coming to the operating room. His pain improved at that point. We proceeded with the procedure as I did not want to put him through shockwave lithotripsy with possible bilateral obstruction.  Findings: Cystoscopy revealed a normal urethra, the bladder was normal without stone or foreign body. The trigone and ureteral orifices were in their normal orthotopic position. There is a small amount of blood at the right ureteral orifice. Left ureteral orifice appeared normal.  Right retrograde pyelogram-this outlined a mildly dilated ureter and collecting system without filling defect or stricture. On withdrawal of the catheter the right side drained briskly.  Left retrograde pyelogram-this outlined a normal distal and mid ureter but contrast reached the point of the stone in the proximal ureter and would progress no further. No proximal contrast would advance.  Left ureteroscopy the stone was found in the proximal ureter. There was quite a bit of edema around the stone impaction site. The left lower pole stone was visualized was protruding from  a sort of calyceal diverticulum which branched off the left lower pole at a 90 angle no was not able to get the laser on the stone.   Description of procedure: After consent was obtained patient brought the operating room. After adequate anesthesia he is placed in lithotomy position and prepped and draped in the usual sterile fashion. A timeout was performed to confirm the patient and procedure. Cystoscope was passed per urethra and the bladder inspected. The right ureteral orifice was cannulated with a 5 [place] open-ended catheter and retrograde injection of contrast was performed. The catheter was withdrawn and this side drained briskly and I saw no evidence of further stone on that side. There is also no stone in the lower pole in the scout image on the right.   The stone was visible in the left proximal ureter on scout as well as the left lower pole stone. The left ureteral orifice was cannulated with a 5 [place] open-ended catheter and retrograde injection of contrast was performed. Contrast again would not progress past the stone. I was able to advance a sensor wire past the stone. A semirigid ureteroscope was advanced in the proximal ureter I was not able to reach the level of the stone despite hubbing the scope. Also the ureter narrowed around the stone from edema therefore it would need the digital approach. Therefore a second wire was advanced but this would also not past the stone. Therefore it was left in the ureter not backed out the semirigid ureteroscope. The 5 [place] open-ended catheter was passed over the second wire to brace it now was able to advance it and coiled in the collecting system. A digital access sheath was then advanced and the digital ureteroscope. Carefully peering through the narrowed ureter I was able to  dust the stone in a setting of 0.3 and 50 enough to create some room to get the scope on the stone. The stone was then dusted with 1 small fragment remaining which was grasped  within the engage basket and removed. I was then able to get the ureteroscope through the narrowed edematous ureter and into the proximal ureter and then into the kidney. I tried to angle the ureteroscope down into the lower pole and it was almost a 90 bend in the lower pole stone was progressing office infundibulum into another calyx and I was not able to get the laser on the stone. Because the ureter was quite edematous I thought was best to leave a ureteral stent. I did not appreciate any other stones in the kidney the renal pelvis and proximal ureter were otherwise normal and the sheath and scope her backed out together and the remainder of the ureter was normal without injury or stone fragment. The wire was backloaded on the cystoscope and a 6 x 26 cm stent was advanced. The wire was removed with a good coil in the kidney and a good coil in the bladder. The bladder was drained and the scope removed.   The patient was awakened taken to the PACU in stable condition.   Complications: None   Blood loss: Minimal   Specimens: None   Drains: 6 x 26 cm left ureteral stent without string

## 2015-07-06 NOTE — Anesthesia Procedure Notes (Signed)
Procedure Name: LMA Insertion Date/Time: 07/06/2015 10:08 PM Performed by: Early OsmondEARGLE, Mellisa Arshad E Pre-anesthesia Checklist: Patient identified, Emergency Drugs available, Suction available and Patient being monitored Patient Re-evaluated:Patient Re-evaluated prior to inductionOxygen Delivery Method: Circle system utilized Preoxygenation: Pre-oxygenation with 100% oxygen Intubation Type: IV induction Ventilation: Mask ventilation without difficulty LMA: LMA inserted LMA Size: 4.0 Number of attempts: 1 Placement Confirmation: positive ETCO2 and breath sounds checked- equal and bilateral Dental Injury: Teeth and Oropharynx as per pre-operative assessment

## 2015-07-06 NOTE — Progress Notes (Signed)
ANTIBIOTIC CONSULT NOTE - INITIAL  Pharmacy Consult for Cefazolin Indication: Surgical Prophylaxis  Allergies  Allergen Reactions  . Codeine Swelling    Lips swell  . Other     Hycodan cough syrup - lips swelling  . Tape Rash    Adhesive tape     Patient Measurements: Height: 5\' 8"  (172.7 cm) Weight: 185 lb (83.915 kg) IBW/kg (Calculated) : 68.4  Vital Signs: Temp: 98 F (36.7 C) (07/17 2108) Temp Source: Oral (07/17 2108) BP: 130/74 mmHg (07/17 2108) Pulse Rate: 84 (07/17 2108) Intake/Output from previous day:   Intake/Output from this shift: Total I/O In: 1000 [I.V.:1000] Out: -   Labs:  Recent Labs  07/06/15 1948  WBC 11.3*  HGB 15.4  PLT 186  CREATININE 2.22*   Estimated Creatinine Clearance: 54.6 mL/min (by C-G formula based on Cr of 2.22). No results for input(s): VANCOTROUGH, VANCOPEAK, VANCORANDOM, GENTTROUGH, GENTPEAK, GENTRANDOM, TOBRATROUGH, TOBRAPEAK, TOBRARND, AMIKACINPEAK, AMIKACINTROU, AMIKACIN in the last 72 hours.   Microbiology: No results found for this or any previous visit (from the past 720 hour(s)).  Medical History: Past Medical History  Diagnosis Date  . Migraine   . Complication of anesthesia   . PONV (postoperative nausea and vomiting)   . Left ureteral calculus   . Left nephrolithiasis   . Wears glasses     Medications:  Scheduled:  . [START ON 07/07/2015]  ceFAZolin (ANCEF) IV  2 g Intravenous On Call to OR   Infusions:   Assessment:  [age] yr male scheduled for lithotripsy on 07/07/15, arrives in ED with c/o vomiting and unable to keep down oral meds  Patient with 7mm kidney stone  AFebrile  MD notes urine does not show evidence of infection  Patient to be admitted and to undergo stenting in the AM  Pharmacy consulted to dose Cefazolin for pre-op surgical prophylaxis  Goal of Therapy:  Surgical prophylaxis  Plan:   Cefazolin 2gm IV x 1 preop - to be given on call to the OR  F/U postop for any further  antibiotic needs  Brendalee Matthies, Joselyn GlassmanLeann Trefz, PharmD 07/06/2015,9:31 PM

## 2015-07-06 NOTE — ED Provider Notes (Signed)
CSN: [id number]     Arrival date & time 07/06/15  1911 History   First MD Initiated Contact with Patient 07/06/15 1930     Chief Complaint  Patient presents with  . Nephrolithiasis     (Consider location/radiation/quality/duration/timing/severity/associated sxs/prior Treatment) HPI Comments: Patient presents with abdominal pain. He has recently diagnosed kidney stone which is a 7 mm stone in the left mid ureter. This was diagnosed in the emergency department last week. He is scheduled to come back in the morning to have lithotripsy done by Dr. Mena Goes. He states he's been taking Percocet and phenergan at home.  He states that since yesterday he's had ongoing nausea and vomiting. He states his pain and vomiting is not controlled with medications. Now he is not able to keep down any of his medications. He denies any fevers. He still having hematuria and he is having some burning on urination.   Past Medical History  Diagnosis Date  . Migraine   . Complication of anesthesia   . PONV (postoperative nausea and vomiting)   . Left ureteral calculus   . Left nephrolithiasis   . Wears glasses    Past Surgical History  Procedure Laterality Date  . Foot capsule release w/ percutaneous heel cord lengthening, tibial tendon transfer Left 1994    clubfoot  . Lymph gland excision  2003    neck--  benign  . Cystoscopy with retrograde pyelogram, ureteroscopy and stent placement Left 06/24/2014    Procedure: CYSTOSCOPY WITH RETROGRADE PYELOGRAM,  AND STENT PLACEMENT;  Surgeon: Magdalene Molly, MD;  Location: [facility];  Service: Urology;  Laterality: Left;  . Cystoscopy with retrograde pyelogram, ureteroscopy and stent placement Left 07/05/2014    Procedure: CYSTO/LEFT URETEROSCOPY/LEFT RETROGRADE PYELOGRAM/LEFT STENT PLACEMENT;  Surgeon: Jerilee Field, MD;  Location: [facility];  Service: Urology;  Laterality: Left;  . Holmium laser application Left 07/05/2014    Procedure: LASER LITHO;   Surgeon: Jerilee Field, MD;  Location: [facility] [address];  Service: Urology;  Laterality: Left;   History reviewed. No pertinent family history. History  Substance Use Topics  . Smoking status: Never Smoker   . Smokeless tobacco: Never Used  . Alcohol Use: 0.0 oz/week    0 Standard drinks or equivalent per week     Comment: occ    Review of Systems  Constitutional: Positive for appetite change and fatigue. Negative for fever, chills and diaphoresis.  HENT: Negative for congestion, rhinorrhea and sneezing.   Eyes: Negative.   Respiratory: Negative for cough, chest tightness and shortness of breath.   Cardiovascular: Negative for chest pain and leg swelling.  Gastrointestinal: Positive for nausea, vomiting and abdominal pain. Negative for diarrhea and blood in stool.  Genitourinary: Positive for dysuria and hematuria. Negative for frequency, flank pain and difficulty urinating.  Musculoskeletal: Positive for back pain. Negative for arthralgias.  Skin: Negative for rash.  Neurological: Negative for dizziness, speech difficulty, weakness, numbness and headaches.      Allergies  Codeine; Other; and Tape  Home Medications   Prior to Admission medications   Medication Sig Start Date End Date Taking? Authorizing Provider  aspirin-acetaminophen-caffeine (EXCEDRIN MIGRAINE) [id number] MG per tablet Take 2 tablets by mouth daily as needed for headache.   Yes Historical Provider, MD  oxyCODONE-acetaminophen (PERCOCET/ROXICET) 5-325 MG per tablet Take 1 tablet by mouth every 4 (four) hours as needed. 06/30/15  Yes Burgess Amor, PA-C  promethazine (PHENERGAN) 25 MG tablet Take 1 tablet (25 mg total) by mouth every  6 (six) hours as needed for nausea or vomiting. 06/30/15  Yes Burgess Amor, PA-C  rizatriptan (MAXALT-MLT) 10 MG disintegrating tablet Take 1 tablet (10 mg total) by mouth as needed for migraine. May repeat in 2 hours if needed; max 2 per 24 hrs 04/15/15  Yes Campbell Riches,  NP  tamsulosin (FLOMAX) 0.4 MG CAPS capsule Take 1 capsule (0.4 mg total) by mouth daily. 06/26/15  Yes Glynn Octave, MD  topiramate (TOPAMAX) 100 MG tablet One po qhs Patient taking differently: Take 100 mg by mouth at bedtime. One po qhs 05/02/15  Yes Campbell Riches, NP  diphenhydrAMINE (BENADRYL) 25 MG tablet Take 2 tablets (50 mg total) by mouth every 6 (six) hours as needed for itching or allergies. Patient not taking: Reported on 06/26/2015 04/01/15   Paula Libra, MD  fluticasone [facility]) 50 MCG/ACT nasal spray Place 2 sprays into both nostrils daily. Patient not taking: Reported on 06/26/2015 02/17/15   Campbell Riches, NP  ibuprofen (ADVIL,MOTRIN) 800 MG tablet Take 1 tablet (800 mg total) by mouth 3 (three) times daily. 06/26/15   Glynn Octave, MD  ondansetron (ZOFRAN) 4 MG tablet Take 1 tablet (4 mg total) by mouth every 6 (six) hours. 07/05/15   Hope Orlene Och, NP   BP 136/72 mmHg  Pulse 88  Temp(Src) 97.9 F (36.6 C) (Oral)  Resp 19  Ht  (1.727 m)  Wt 185 lb (83.915 kg)  BMI 28.14 kg/m2  SpO2 99% Physical Exam  Constitutional: He is oriented to person, place, and time. He appears well-developed and well-nourished.  HENT:  Head: Normocephalic and atraumatic.  Eyes: Pupils are equal, round, and reactive to light.  Neck: Normal range of motion. Neck supple.  Cardiovascular: Normal rate, regular rhythm and normal heart sounds.   Pulmonary/Chest: Effort normal and breath sounds normal. No respiratory distress. He has no wheezes. He has no rales. He exhibits no tenderness.  Abdominal: Soft. Bowel sounds are normal. There is tenderness (Moderate tenderness across the lower abdomen bilaterally). There is no rebound and no guarding.  Musculoskeletal: Normal range of motion. He exhibits no edema.  Tenderness to lower back bilaterally  Lymphadenopathy:    He has no cervical adenopathy.  Neurological: He is alert and oriented to person, place, and time.  Skin: Skin is warm and  dry. No rash noted.  Psychiatric: He has a normal mood and affect.    ED Course  Procedures (including critical care time) Labs Review Labs Reviewed  BASIC METABOLIC PANEL - Abnormal; Notable for the following:    CO2 21 (*)    BUN 23 (*)    Creatinine, Ser 2.22 (*)    GFR calc non Af Amer 40 (*)    GFR calc Af Amer 46 (*)    All other components within normal limits  CBC WITH DIFFERENTIAL/PLATELET - Abnormal; Notable for the following:    WBC 11.3 (*)    Neutrophils Relative % 83 (*)    Neutro Abs 9.4 (*)    Lymphocytes Relative 11 (*)    All other components within normal limits  URINALYSIS, ROUTINE W REFLEX MICROSCOPIC (NOT AT [facility]) - Abnormal; Notable for the following:    Color, Urine RED (*)    APPearance CLOUDY (*)    Hgb urine dipstick LARGE (*)    Protein, ur 100 (*)    Leukocytes, UA TRACE (*)    All other components within normal limits  URINE MICROSCOPIC-ADD ON    Imaging Review Dg  Abd 1 View  07/06/2015   CLINICAL DATA:  24 year old male with bilateral flank pain and history of kidney stones.  EXAM: ABDOMEN - 1 VIEW  COMPARISON:  Radiograph dated 06/30/2015 and CT dated 06/26/2015  FINDINGS: There is a stable 13 mm calculus over the inferior left renal silhouette. A stable appearing 7 mm high attenuating density is also noted along the course of the left ureter at the level of the L3 vertebra similar in position or approximately 1 cm more distally compared the prior study.  IMPRESSION: Left renal calculus as well as 7 mm calculus along the course of the mid left ureter in stable positioning or slightly more distally compared to prior study.   Electronically Signed   By: Elgie CollardArash  Radparvar M.D.   On: 07/06/2015 20:19     EKG Interpretation None      MDM   Final diagnoses:  Kidney stone    Patient presents with worsening pain and vomiting related to a 7 mm stone in the left mid ureter. He's not able to keep his pain medications or. Down at home. His urine does  not show evidence of infection. His creatinine is bumped to 2.2. I spoke with Dr. Mena GoesEskridge who will admit the patient for stenting.    Rolan BuccoMelanie Ishaaq Penna, MD 07/06/15 2049

## 2015-07-07 ENCOUNTER — Encounter (HOSPITAL_COMMUNITY): Payer: Self-pay | Admitting: Urology

## 2015-07-07 ENCOUNTER — Encounter (HOSPITAL_COMMUNITY)
Admission: EM | Disposition: A | Discharge status: Home / Self Care | Payer: Self-pay | Source: Home / Self Care | Attending: Urology

## 2015-07-07 ENCOUNTER — Ambulatory Visit (HOSPITAL_COMMUNITY): Admission: RE | Admit: 2015-07-07 | Payer: 59 | Source: Ambulatory Visit | Admitting: Urology

## 2015-07-07 LAB — BASIC METABOLIC PANEL
Anion gap: 9 (ref 5–15)
BUN: 22 mg/dL — ABNORMAL HIGH (ref 6–20)
CO2: 23 mmol/L (ref 22–32)
Calcium: 9.2 mg/dL (ref 8.9–10.3)
Chloride: 106 mmol/L (ref 101–111)
Creatinine, Ser: 1.61 mg/dL — ABNORMAL HIGH (ref 0.61–1.24)
GFR calc Af Amer: >60 mL/min (ref >60)
GFR calc non Af Amer: 59 mL/min — ABNORMAL LOW (ref >60)
Glucose, Bld: 117 mg/dL — ABNORMAL HIGH (ref 65–99)
Potassium: 4.2 mmol/L (ref 3.5–5.1)
Sodium: 138 mmol/L (ref 135–145)

## 2015-07-07 SURGERY — LITHOTRIPSY, ESWL
Anesthesia: LOCAL | Laterality: Left

## 2015-07-07 MED ORDER — SODIUM CHLORIDE 0.9 % IV SOLN
INTRAVENOUS | Status: DC
  Administered 2015-07-07: 01:00:00 via INTRAVENOUS

## 2015-07-07 MED ORDER — PHENAZOPYRIDINE HCL 100 MG PO TABS
100.0000 mg | ORAL_TABLET | Freq: Three times a day (TID) | ORAL | Status: DC | PRN
  Administered 2015-07-07: 100 mg via ORAL
  Filled 2015-07-07 (×2): qty 1

## 2015-07-07 MED ORDER — DIPHENHYDRAMINE HCL 25 MG PO CAPS
25.0000 mg | ORAL_CAPSULE | Freq: Once | ORAL | Status: AC
  Administered 2015-07-07: 25 mg via ORAL
  Filled 2015-07-07: qty 1

## 2015-07-07 MED ORDER — TAMSULOSIN HCL 0.4 MG PO CAPS: 0.4000 mg | ORAL_CAPSULE | Freq: Every day | ORAL | Status: DC

## 2015-07-07 MED ORDER — MENTHOL 3 MG MT LOZG
1.0000 | LOZENGE | OROMUCOSAL | Status: DC | PRN
  Filled 2015-07-07: qty 9

## 2015-07-07 MED ORDER — OXYCODONE-ACETAMINOPHEN 5-325 MG PO TABS: 1.0000 | ORAL_TABLET | ORAL | Status: DC | PRN

## 2015-07-07 MED ORDER — ONDANSETRON HCL 4 MG PO TABS: 4.0000 mg | ORAL_TABLET | Freq: Three times a day (TID) | ORAL | Status: DC | PRN

## 2015-07-07 MED ORDER — PHENAZOPYRIDINE HCL 200 MG PO TABS: 200.0000 mg | ORAL_TABLET | Freq: Two times a day (BID) | ORAL | Status: DC | PRN

## 2015-07-07 MED ORDER — HYDROMORPHONE HCL 1 MG/ML IJ SOLN
0.5000 mg | INTRAMUSCULAR | Status: DC | PRN
  Administered 2015-07-07 (×4): 1 mg via INTRAVENOUS
  Filled 2015-07-07 (×4): qty 1

## 2015-07-07 MED ORDER — HYDROMORPHONE HCL 4 MG PO TABS: 4.0000 mg | ORAL_TABLET | Freq: Four times a day (QID) | ORAL | Status: DC | PRN

## 2015-07-07 MED ORDER — ACETAMINOPHEN 325 MG PO TABS
650.0000 mg | ORAL_TABLET | ORAL | Status: DC | PRN
  Administered 2015-07-07: 650 mg via ORAL
  Filled 2015-07-07: qty 2

## 2015-07-07 MED ORDER — ONDANSETRON HCL 4 MG/2ML IJ SOLN
4.0000 mg | INTRAMUSCULAR | Status: DC | PRN
  Filled 2015-07-07: qty 2

## 2015-07-07 MED ORDER — DIAZEPAM 5 MG PO TABS
10.0000 mg | ORAL_TABLET | Freq: Once | ORAL | Status: AC
  Administered 2015-07-07: 10 mg via ORAL
  Filled 2015-07-07: qty 2

## 2015-07-07 MED ORDER — CIPROFLOXACIN HCL 500 MG PO TABS
500.0000 mg | ORAL_TABLET | Freq: Once | ORAL | Status: AC
  Administered 2015-07-07: 500 mg via ORAL
  Filled 2015-07-07: qty 1

## 2015-07-07 NOTE — Progress Notes (Signed)
Report to Jessica RN.

## 2015-07-07 NOTE — Progress Notes (Signed)
Pt voided x1 yielded 400 mL red urine. Small stone also noted in urine once strained. Will continue to monitor closely. Mardene CelesteAsaro, Kamilya Wakeman I

## 2015-07-07 NOTE — Op Note (Signed)
Pre-op diagnosis: left lower pole renal stone Post-op diagnosis: same   Procedure: left ESWL   See [facility] stone scanned op note

## 2015-07-07 NOTE — Anesthesia Postprocedure Evaluation (Signed)
  Anesthesia Post-op Note  Patient: Thomas Howell  Procedure(s) Performed: Procedure(s) (LRB): CYSTOSCOPY WITH RETROGRADE PYELOGRAM, URETEROSCOPY , laser, STENT PLACEMENT and basket extraction (Left) CYSTOSCOPY WITH RETROGRADE PYELOGRAM (Right)  Patient Location: PACU  Anesthesia Type: General  Level of Consciousness: awake and alert   Airway and Oxygen Therapy: Patient Spontanous Breathing  Post-op Pain: mild  Post-op Assessment: Post-op Vital signs reviewed, Patient's Cardiovascular Status Stable, Respiratory Function Stable, Patent Airway and No signs of Nausea or vomiting  Last Vitals:  Filed Vitals:   07/07/15 1043  BP: 117/58  Pulse: 57  Temp: 36.2 C  Resp: 16    Post-op Vital Signs: stable   Complications: No apparent anesthesia complications

## 2015-07-07 NOTE — Discharge Instructions (Signed)
Ureteral Stent Implantation, Care After Refer to this sheet in the next few weeks. These instructions provide you with information on caring for yourself after your procedure. Your health care provider may also give you more specific instructions. Your treatment has been planned according to current medical practices, but problems sometimes occur. Call your health care provider if you have any problems or questions after your procedure. WHAT TO EXPECT AFTER THE PROCEDURE You should be back to normal activity within 48 hours after the procedure. Nausea and vomiting may occur and are commonly the result of anesthesia. It is common to experience sharp pain in the back or lower abdomen and penis with voiding. This is caused by movement of the ends of the stent with the act of urinating.It usually goes away within minutes after you have stopped urinating. HOME CARE INSTRUCTIONS Make sure to drink plenty of fluids. You may have small amounts of bleeding, causing your urine to be red. This is normal. Certain movements may trigger pain or a feeling that you need to urinate. You may be given medicines to prevent infection or bladder spasms. Be sure to take all medicines as directed. Only take over-the-counter or prescription medicines for pain, discomfort, or fever as directed by your health care provider. Do not take aspirin, as this can make bleeding worse.  Your stent will be left in for 2 - 3 weeks. It will be removed in the office. Be sure to keep all follow-up appointments so your health care provider can check that you are healing properly and remove the stent.   SEEK MEDICAL CARE IF:  You experience increasing pain.  Your pain medicine is not working. SEEK IMMEDIATE MEDICAL CARE IF:  Your urine is dark red or has blood clots.  You are leaking urine (incontinent).  You have a fever, chills, feeling sick to your stomach (nausea), or vomiting.  Your pain is not relieved by pain medicine.  The  end of the stent comes out of the urethra.  You are unable to urinate.  Lithotripsy, Care After Refer to this sheet in the next few weeks. These instructions provide you with information on caring for yourself after your procedure. Your health care provider may also give you more specific instructions. Your treatment has been planned according to current medical practices, but problems sometimes occur. Call your health care provider if you have any problems or questions after your procedure. WHAT TO EXPECT AFTER THE PROCEDURE   Your urine may have a red tinge for a few days after treatment. Blood loss is usually minimal.  You may have soreness in the back or flank area. This usually goes away after a few days. The procedure can cause blotches or bruises on the back where the pressure wave enters the skin. These marks usually cause only minimal discomfort and should disappear in a short time.  Stone fragments should begin to pass within 24 hours of treatment. However, a delayed passage is not unusual.  You may have pain, discomfort, and feel sick to your stomach (nauseated) when the crushed fragments of stone are passed down the tube from the kidney to the bladder. Stone fragments can pass soon after the procedure and may last for up to 4-8 weeks.  A small number of patients may have severe pain when stone fragments are not able to pass, which leads to an obstruction.  If your stone is greater than 1 inch (2.5 cm) in diameter or if you have multiple stones that have  a combined diameter greater than 1 inch (2.5 cm), you may require more than one treatment.  If you had a stent placed prior to your procedure, you may experience some discomfort, especially during urination. You may experience the pain or discomfort in your flank or back, or you may experience a sharp pain or discomfort at the base of your penis or in your lower abdomen. The discomfort usually lasts only a few minutes after  urinating. HOME CARE INSTRUCTIONS   Rest at home until you feel your energy improving.  Only take over-the-counter or prescription medicines for pain, discomfort, or fever as directed by your health care provider. Depending on the type of lithotripsy, you may need to take antibiotics and anti-inflammatory medicines for a few days.  Drink enough water and fluids to keep your urine clear or pale yellow. This helps "flush" your kidneys. It helps pass any remaining pieces of stone and prevents stones from coming back.  Most people can resume daily activities within 1-2 days after standard lithotripsy. It can take longer to recover from laser and percutaneous lithotripsy.  If the stones are in your urinary system, you may be asked to strain your urine at home to look for stones. Any stones that are found can be sent to a medical lab for examination.  Visit your health care provider for a follow-up appointment in a few weeks. Your doctor may remove your stent if you have one. Your health care provider will also check to see whether stone particles still remain. SEEK MEDICAL CARE IF:   Your pain is not relieved by medicine.  You have a lasting nauseous feeling.  You feel there is too much blood in the urine.  You develop persistent problems with frequent or painful urination that does not at least partially improve after 2 days following the procedure.  You have a congested cough.  You feel lightheaded.  You develop a rash or any other signs that might suggest an allergic problem.  You develop any reaction or side effects to your medicine(s). SEEK IMMEDIATE MEDICAL CARE IF:   You experience severe back or flank pain or both.  You see nothing but blood when you urinate.  You cannot pass any urine at all.  You have a fever or shaking chills.  You develop shortness of breath, difficulty breathing, or chest pain.  You develop vomiting that will not stop after 6-8 hours.  You have a  fainting episode. Document Released: 12/26/2007 Document Revised: 09/26/2013 Document Reviewed: 06/21/2013 [facility] Patient Information 2015 [employer], [place]. This information is not intended to replace advice given to you by your health care provider. Make sure you discuss any questions you have with your health care provider.

## 2015-07-07 NOTE — Progress Notes (Signed)
Patient discharged home with family, discharge instructions given and explained to patient/mother and they verbalized understanding, denies any pain/distress. Accompanied home by mother. Skin intact, no wound noted.

## 2015-07-07 NOTE — Progress Notes (Signed)
Pt c/o some mild dysuria. No Fever or chills. Mild stent pain.   Filed Vitals:   07/07/15 0612  BP: 128/62  Pulse: 83  Temp: 98.3 F (36.8 C)  Resp: 18    PE: NAD CV - RRR Lungs - regular effort, depth, rate abd - soft. nt Ext - no CCE, calf pain or tenderness  A/p - Left LP stone - plan ESWL, left S/p left URS/HLL/stent for left ureteral stone

## 2015-07-08 NOTE — Discharge Summary (Signed)
Physician Discharge Summary  Patient ID: Thomas MendsRussell K Indelicato MRN: [id number] DOB/AGE: 1991/02/22 24 y.o.  Admit date: 07/06/2015 Discharge date: 07/08/2015  Admission Diagnoses: Acute renal failure, Left ureteral stone, Left renal stone  Discharge Diagnoses:  Active Problems:   Kidney stone   Left ureteral stone   Discharged Condition: Good  Hospital Course: Patient was admitted following left ureteroscopy holmium laser lithotripsy and stent for ureteral stone. The left proximal ureter was quite tight was difficult to get the access sheath close to the kidney and get the ureteroscope into the kidney. He had a large left lower pole stone which was difficult to approach. He was scheduled for shockwave lithotripsy the next day for the ureteral stone and since this was cleared we kept that appointment for the left lower pole stone. Following left lower pole shockwave lithotripsy he was discharged in stable condition.  Consults: None  Significant Diagnostic Studies: KUB  Treatments: surgery: #1 left ureteroscopy, holmium laser lithotripsy and stent placement for ureteral stone, #2 left extracorporeal shockwave lithotripsy for left lower pole stone  Discharge Exam: Blood pressure 117/58, pulse 57, temperature 97.2 F (36.2 C), temperature source Oral, resp. rate 16, height 5\' 8"  (1.727 m), weight 83.915 kg (185 lb), SpO2 99 %.  Disposition: 01-Home or Self Care  Discharge Instructions    Discharge patient    Complete by:  As directed             Medication List    STOP taking these medications        aspirin-acetaminophen-caffeine 250-250-65 MG per tablet  Commonly known as:  EXCEDRIN MIGRAINE     diphenhydrAMINE 25 MG tablet  Commonly known as:  BENADRYL     fluticasone 50 MCG/ACT nasal spray  Commonly known as:  FLONASE     ibuprofen 800 MG tablet  Commonly known as:  ADVIL,MOTRIN     oxyCODONE-acetaminophen 5-325 MG per tablet  Commonly known as:  PERCOCET/ROXICET      promethazine 25 MG tablet  Commonly known as:  PHENERGAN      TAKE these medications        HYDROmorphone 4 MG tablet  Commonly known as:  DILAUDID  Take 1 tablet (4 mg total) by mouth every 6 (six) hours as needed for moderate pain or severe pain.     ondansetron 4 MG tablet  Commonly known as:  ZOFRAN  Take 1 tablet (4 mg total) by mouth every 6 (six) hours.     ondansetron 4 MG tablet  Commonly known as:  ZOFRAN  Take 1 tablet (4 mg total) by mouth every 8 (eight) hours as needed for nausea or vomiting.     phenazopyridine 200 MG tablet  Commonly known as:  PYRIDIUM  Take 1 tablet (200 mg total) by mouth 2 (two) times daily as needed for pain.     rizatriptan 10 MG disintegrating tablet  Commonly known as:  MAXALT-MLT  Take 1 tablet (10 mg total) by mouth as needed for migraine. May repeat in 2 hours if needed; max 2 per 24 hrs     tamsulosin 0.4 MG Caps capsule  Commonly known as:  FLOMAX  Take 1 capsule (0.4 mg total) by mouth daily.     tamsulosin 0.4 MG Caps capsule  Commonly known as:  FLOMAX  Take 1 capsule (0.4 mg total) by mouth daily after supper.     tamsulosin 0.4 MG Caps capsule  Commonly known as:  FLOMAX  Take 1 capsule (0.4 mg total)  by mouth daily after supper.     topiramate 100 MG tablet  Commonly known as:  TOPAMAX  One po qhs           Follow-up Information    Follow up with Jerilee Field, MD.   Specialty:  Urology   Why:  2-3 weeks   Contact information:   [address] [place] [address] [phone]       Signed: Jerilee Field 07/08/2015, 11:31 AM

## 2015-07-14 ENCOUNTER — Encounter (HOSPITAL_COMMUNITY): Payer: Self-pay | Admitting: Emergency Medicine

## 2015-07-14 ENCOUNTER — Emergency Department (HOSPITAL_COMMUNITY)
Admission: EM | Admit: 2015-07-14 | Discharge: 2015-07-14 | Disposition: A | Discharge status: Home / Self Care | Payer: 59 | Attending: Emergency Medicine | Admitting: Emergency Medicine

## 2015-07-14 DIAGNOSIS — G43909 Migraine, unspecified, not intractable, without status migrainosus: Secondary | ICD-10-CM | POA: Insufficient documentation

## 2015-07-14 DIAGNOSIS — Z973 Presence of spectacles and contact lenses: Secondary | ICD-10-CM | POA: Diagnosis not present

## 2015-07-14 DIAGNOSIS — Z79899 Other long term (current) drug therapy: Secondary | ICD-10-CM | POA: Diagnosis not present

## 2015-07-14 DIAGNOSIS — R319 Hematuria, unspecified: Secondary | ICD-10-CM | POA: Diagnosis present

## 2015-07-14 DIAGNOSIS — Z87442 Personal history of urinary calculi: Secondary | ICD-10-CM | POA: Insufficient documentation

## 2015-07-14 LAB — CBC
HCT: 42.9 % (ref 39.0–52.0)
Hemoglobin: 15.1 g/dL (ref 13.0–17.0)
MCH: 30.6 pg (ref 26.0–34.0)
MCHC: 35.2 g/dL (ref 30.0–36.0)
MCV: 86.8 fL (ref 78.0–100.0)
Platelets: 248 10*3/uL (ref 150–400)
RBC: 4.94 MIL/uL (ref 4.22–5.81)
RDW: 12.1 % (ref 11.5–15.5)
WBC: 10.7 10*3/uL — ABNORMAL HIGH (ref 4.0–10.5)

## 2015-07-14 LAB — URINALYSIS, ROUTINE W REFLEX MICROSCOPIC
Bilirubin Urine: NEGATIVE
Glucose, UA: NEGATIVE mg/dL
Ketones, ur: NEGATIVE mg/dL
Nitrite: NEGATIVE
Protein, ur: >300 mg/dL — AB
Specific Gravity, Urine: 1.02 (ref 1.005–1.030)
Urobilinogen, UA: 0.2 mg/dL (ref 0.0–1.0)
pH: 6 (ref 5.0–8.0)

## 2015-07-14 LAB — COMPREHENSIVE METABOLIC PANEL
ALT: 12 U/L — ABNORMAL LOW (ref 17–63)
AST: 18 U/L (ref 15–41)
Albumin: 4.5 g/dL (ref 3.5–5.0)
Alkaline Phosphatase: 53 U/L (ref 38–126)
Anion gap: 6 (ref 5–15)
BUN: 20 mg/dL (ref 6–20)
CO2: 25 mmol/L (ref 22–32)
Calcium: 9.1 mg/dL (ref 8.9–10.3)
Chloride: 106 mmol/L (ref 101–111)
Creatinine, Ser: 1.04 mg/dL (ref 0.61–1.24)
GFR calc Af Amer: >60 mL/min (ref >60)
GFR calc non Af Amer: >60 mL/min (ref >60)
Glucose, Bld: 91 mg/dL (ref 65–99)
Potassium: 3.6 mmol/L (ref 3.5–5.1)
Sodium: 137 mmol/L (ref 135–145)
Total Bilirubin: 0.4 mg/dL (ref 0.3–1.2)
Total Protein: 7.9 g/dL (ref 6.5–8.1)

## 2015-07-14 LAB — URINE MICROSCOPIC-ADD ON

## 2015-07-14 MED ORDER — FENTANYL CITRATE (PF) 100 MCG/2ML IJ SOLN
50.0000 ug | Freq: Once | INTRAMUSCULAR | Status: AC
  Administered 2015-07-14: 50 ug via INTRAVENOUS
  Filled 2015-07-14: qty 2

## 2015-07-14 MED ORDER — ONDANSETRON HCL 4 MG/2ML IJ SOLN
4.0000 mg | Freq: Once | INTRAMUSCULAR | Status: AC | PRN
  Administered 2015-07-14: 4 mg via INTRAVENOUS
  Filled 2015-07-14: qty 2

## 2015-07-14 NOTE — Discharge Instructions (Signed)
Take all of your medicines including Flomax as prescribed.   Hematuria Hematuria is blood in your urine. It can be caused by a bladder infection, kidney infection, prostate infection, kidney stone, or cancer of your urinary tract. Infections can usually be treated with medicine, and a kidney stone usually will pass through your urine. If neither of these is the cause of your hematuria, further workup to find out the reason may be needed. It is very important that you tell your health care provider about any blood you see in your urine, even if the blood stops without treatment or happens without causing pain. Blood in your urine that happens and then stops and then happens again can be a symptom of a very serious condition. Also, pain is not a symptom in the initial stages of many urinary cancers. HOME CARE INSTRUCTIONS   Drink lots of fluid, 3-4 quarts a day. If you have been diagnosed with an infection, cranberry juice is especially recommended, in addition to large amounts of water.  Avoid caffeine, tea, and carbonated beverages because they tend to irritate the bladder.  Avoid alcohol because it may irritate the prostate.  Take all medicines as directed by your health care provider.  If you were prescribed an antibiotic medicine, finish it all even if you start to feel better.  If you have been diagnosed with a kidney stone, follow your health care provider's instructions regarding straining your urine to catch the stone.  Empty your bladder often. Avoid holding urine for long periods of time.  After a bowel movement, women should cleanse front to back. Use each tissue only once.  Empty your bladder before and after sexual intercourse if you are a male. SEEK MEDICAL CARE IF:  You develop back pain.  You have a fever.  You have a feeling of sickness in your stomach (nausea) or vomiting.  Your symptoms are not better in 3 days. Return sooner if you are getting worse. SEEK  IMMEDIATE MEDICAL CARE IF:   You develop severe vomiting and are unable to keep the medicine down.  You develop severe back or abdominal pain despite taking your medicines.  You begin passing a large amount of blood or clots in your urine.  You feel extremely weak or faint, or you pass out. MAKE SURE YOU:   Understand these instructions.  Will watch your condition.  Will get help right away if you are not doing well or get worse. Document Released: 12/06/2005 Document Revised: 04/22/2014 Document Reviewed: 08/06/2013 [facility] Patient Information 2015 [place], [place]. This information is not intended to replace advice given to you by your health care provider. Make sure you discuss any questions you have with your health care provider.

## 2015-07-14 NOTE — ED Notes (Signed)
Pt states he had stent placement for a kidney stone last Sunday and laser lithotripsy on Monday  Pt states he has continued to pass a large amt of blood and clots in his urine  Pt states he has been drinking large amts of water with no change  Pt states his urine goes from coffee color to the color of fruit punch  Pt states his surgery was done by Dr Sol Passer at [facility]   Pt states he tried to call him but he would not return his call

## 2015-07-14 NOTE — ED Provider Notes (Signed)
CSN: [id number]     Arrival date & time 07/14/15  2118 History   First MD Initiated Contact with Patient 07/14/15 2224     Chief Complaint  Patient presents with  . Hematuria     (Consider location/radiation/quality/duration/timing/severity/associated sxs/prior Treatment) HPI    Thomas Howell is a 24 y.o. male who presents for evaluation of hematuria present for several days, worsening, and noted clots in it. He denies fever, chills, nausea or vomiting. He stopped taking his Flomax, 2 days ago. He had ureteral stenting, and lithotripsy done, one week ago. There are no other known modifying factors.   Past Medical History  Diagnosis Date  . Migraine   . Complication of anesthesia   . PONV (postoperative nausea and vomiting)   . Left ureteral calculus   . Left nephrolithiasis   . Wears glasses    Past Surgical History  Procedure Laterality Date  . Foot capsule release w/ percutaneous heel cord lengthening, tibial tendon transfer Left 1994    clubfoot  . Lymph gland excision  2003    neck--  benign  . Cystoscopy with retrograde pyelogram, ureteroscopy and stent placement Left 06/24/2014    Procedure: CYSTOSCOPY WITH RETROGRADE PYELOGRAM,  AND STENT PLACEMENT;  Surgeon: Magdalene Molly, MD;  Location: [facility];  Service: Urology;  Laterality: Left;  . Cystoscopy with retrograde pyelogram, ureteroscopy and stent placement Left 07/05/2014    Procedure: CYSTO/LEFT URETEROSCOPY/LEFT RETROGRADE PYELOGRAM/LEFT STENT PLACEMENT;  Surgeon: Jerilee Field, MD;  Location: [facility];  Service: Urology;  Laterality: Left;  . Holmium laser application Left 07/05/2014    Procedure: LASER LITHO;  Surgeon: Jerilee Field, MD;  Location: [facility];  Service: Urology;  Laterality: Left;  . Cystoscopy with retrograde pyelogram, ureteroscopy and stent placement Left 07/06/2015    Procedure: CYSTOSCOPY WITH RETROGRADE PYELOGRAM, URETEROSCOPY , LASER, STENT PLACEMENT  and BASKET EXTRACTION;  Surgeon: Jerilee Field, MD;  Location: [facility];  Service: Urology;  Laterality: Left;  . Cystoscopy w/ retrogrades Right 07/06/2015    Procedure: CYSTOSCOPY WITH RETROGRADE PYELOGRAM;  Surgeon: Jerilee Field, MD;  Location: [facility];  Service: Urology;  Laterality: Right;   Family History  Problem Relation Age of Onset  . Cancer Father   . Cancer Other   . Hypertension Other   . Hyperlipidemia Other   . Stroke Other    History  Substance Use Topics  . Smoking status: Never Smoker   . Smokeless tobacco: Never Used  . Alcohol Use: No     Comment: occ    Review of Systems  All other systems reviewed and are negative.     Allergies  Codeine; Other; and Tape  Home Medications   Prior to Admission medications   Medication Sig Start Date End Date Taking? Authorizing Provider  HYDROmorphone (DILAUDID) 4 MG tablet Take 1 tablet (4 mg total) by mouth every 6 (six) hours as needed for moderate pain or severe pain. 07/07/15  Yes Jerilee Field, MD  naproxen sodium (ANAPROX) 220 MG tablet Take 440 mg by mouth 2 (two) times daily as needed (pain).   Yes Historical Provider, MD  phenazopyridine (PYRIDIUM) 200 MG tablet Take 1 tablet (200 mg total) by mouth 2 (two) times daily as needed for pain. 07/07/15  Yes Jerilee Field, MD  rizatriptan (MAXALT-MLT) 10 MG disintegrating tablet Take 1 tablet (10 mg total) by mouth as needed for migraine. May repeat in 2 hours if needed; max 2 per 24 hrs 04/15/15  Yes  Campbell Riches, NP  tamsulosin (FLOMAX) 0.4 MG CAPS capsule Take 1 capsule (0.4 mg total) by mouth daily. 06/26/15  Yes Glynn Octave, MD  topiramate (TOPAMAX) 100 MG tablet One po qhs Patient taking differently: Take 100 mg by mouth at bedtime. One po qhs 05/02/15  Yes Campbell Riches, NP  ondansetron (ZOFRAN) 4 MG tablet Take 1 tablet (4 mg total) by mouth every 6 (six) hours. 07/05/15   Hope Orlene Och, NP  ondansetron (ZOFRAN) 4 MG tablet Take 1 tablet (4 mg  total) by mouth every 8 (eight) hours as needed for nausea or vomiting. Patient not taking: Reported on 07/14/2015 07/07/15   Jerilee Field, MD  tamsulosin (FLOMAX) 0.4 MG CAPS capsule Take 1 capsule (0.4 mg total) by mouth daily after supper. Patient not taking: Reported on 07/14/2015 07/06/15   Jerilee Field, MD  tamsulosin (FLOMAX) 0.4 MG CAPS capsule Take 1 capsule (0.4 mg total) by mouth daily after supper. Patient not taking: Reported on 07/14/2015 07/07/15   Jerilee Field, MD   BP 124/65 mmHg  Pulse 53  Temp(Src) 98.2 F (36.8 C) (Oral)  Resp 16  SpO2 100% Physical Exam  Constitutional: He is oriented to person, place, and time. He appears well-developed and well-nourished. No distress.  HENT:  Head: Normocephalic and atraumatic.  Right Ear: External ear normal.  Left Ear: External ear normal.  Eyes: Conjunctivae and EOM are normal. Pupils are equal, round, and reactive to light.  Neck: Normal range of motion and phonation normal. Neck supple.  Cardiovascular: Normal rate, regular rhythm and normal heart sounds.   Pulmonary/Chest: Effort normal and breath sounds normal. No respiratory distress. He exhibits no bony tenderness.  Abdominal: Soft. He exhibits no distension and no mass. There is no tenderness.  Genitourinary:  The vertebral angle percussive tenderness  Musculoskeletal: Normal range of motion.  Neurological: He is alert and oriented to person, place, and time. No cranial nerve deficit or sensory deficit. He exhibits normal muscle tone. Coordination normal.  Skin: Skin is warm, dry and intact.  Psychiatric: He has a normal mood and affect. His behavior is normal. Judgment and thought content normal.  Nursing note and vitals reviewed.   ED Course  Procedures (including critical care time)  Medications  fentaNYL (SUBLIMAZE) injection 50 mcg (50 mcg Intravenous Given 07/14/15 2205)  ondansetron (ZOFRAN) injection 4 mg (4 mg Intravenous Given 07/14/15 2210)     Patient Vitals for the past 24 hrs:  BP Temp Temp src Pulse Resp SpO2  07/14/15 2132 124/65 mmHg 98.2 F (36.8 C) Oral (!) 53 16 100 %  07/14/15 2123 132/78 mmHg 97.8 F (36.6 C) Oral 86 18 99 %    Case findings discussed with on-call urology, who recommends discharge with symptomatic treatment.  11:08 PM Reevaluation with update and discussion. After initial assessment and treatment, an updated evaluation reveals his pain is improved to 3/10, with treatment here. Findings discussed with patient and mother, all questions were answered. Kourtney Montesinos L    Labs Review Labs Reviewed  URINALYSIS, ROUTINE W REFLEX MICROSCOPIC (NOT AT [facility]) - Abnormal; Notable for the following:    Color, Urine RED (*)    APPearance TURBID (*)    Hgb urine dipstick LARGE (*)    Protein, ur >300 (*)    Leukocytes, UA MODERATE (*)    All other components within normal limits  CBC - Abnormal; Notable for the following:    WBC 10.7 (*)    All other components within normal  limits  COMPREHENSIVE METABOLIC PANEL - Abnormal; Notable for the following:    ALT 12 (*)    All other components within normal limits  URINE MICROSCOPIC-ADD ON    Imaging Review No results found.   EKG Interpretation None      MDM   Final diagnoses:  Hematuria    Hematuria and flank pain related to ureteral colic, likely passage of small stones as source. No clear evidence for urinary tract infection, or suggestion for obstructing nephropathy.  Nursing Notes Reviewed/ Care Coordinated Applicable Imaging Reviewed Interpretation of Laboratory Data incorporated into ED treatment  The patient appears reasonably screened and/or stabilized for discharge and I doubt any other medical condition or other [facility] requiring further screening, evaluation, or treatment in the ED at this time prior to discharge.  Plan: Home Medications- usual; Home Treatments- rest; return here if the recommended treatment, does not improve the  symptoms; Recommended follow up- contact urology in the morning to arrange a follow-up appointment.     Mancel Bale, MD 07/14/15 [phone]

## 2015-07-22 ENCOUNTER — Other Ambulatory Visit: Payer: Self-pay | Admitting: Urology

## 2015-07-28 IMAGING — DX DG ABDOMEN 1V
1 series · 1 of 1 positions shown · non-contrast
Comparison: 12/26/2014

CLINICAL DATA: Left flank pain, history of lithotripsy 3 weeks ago

EXAM:
ABDOMEN - 1 VIEW

[abdomen supine]
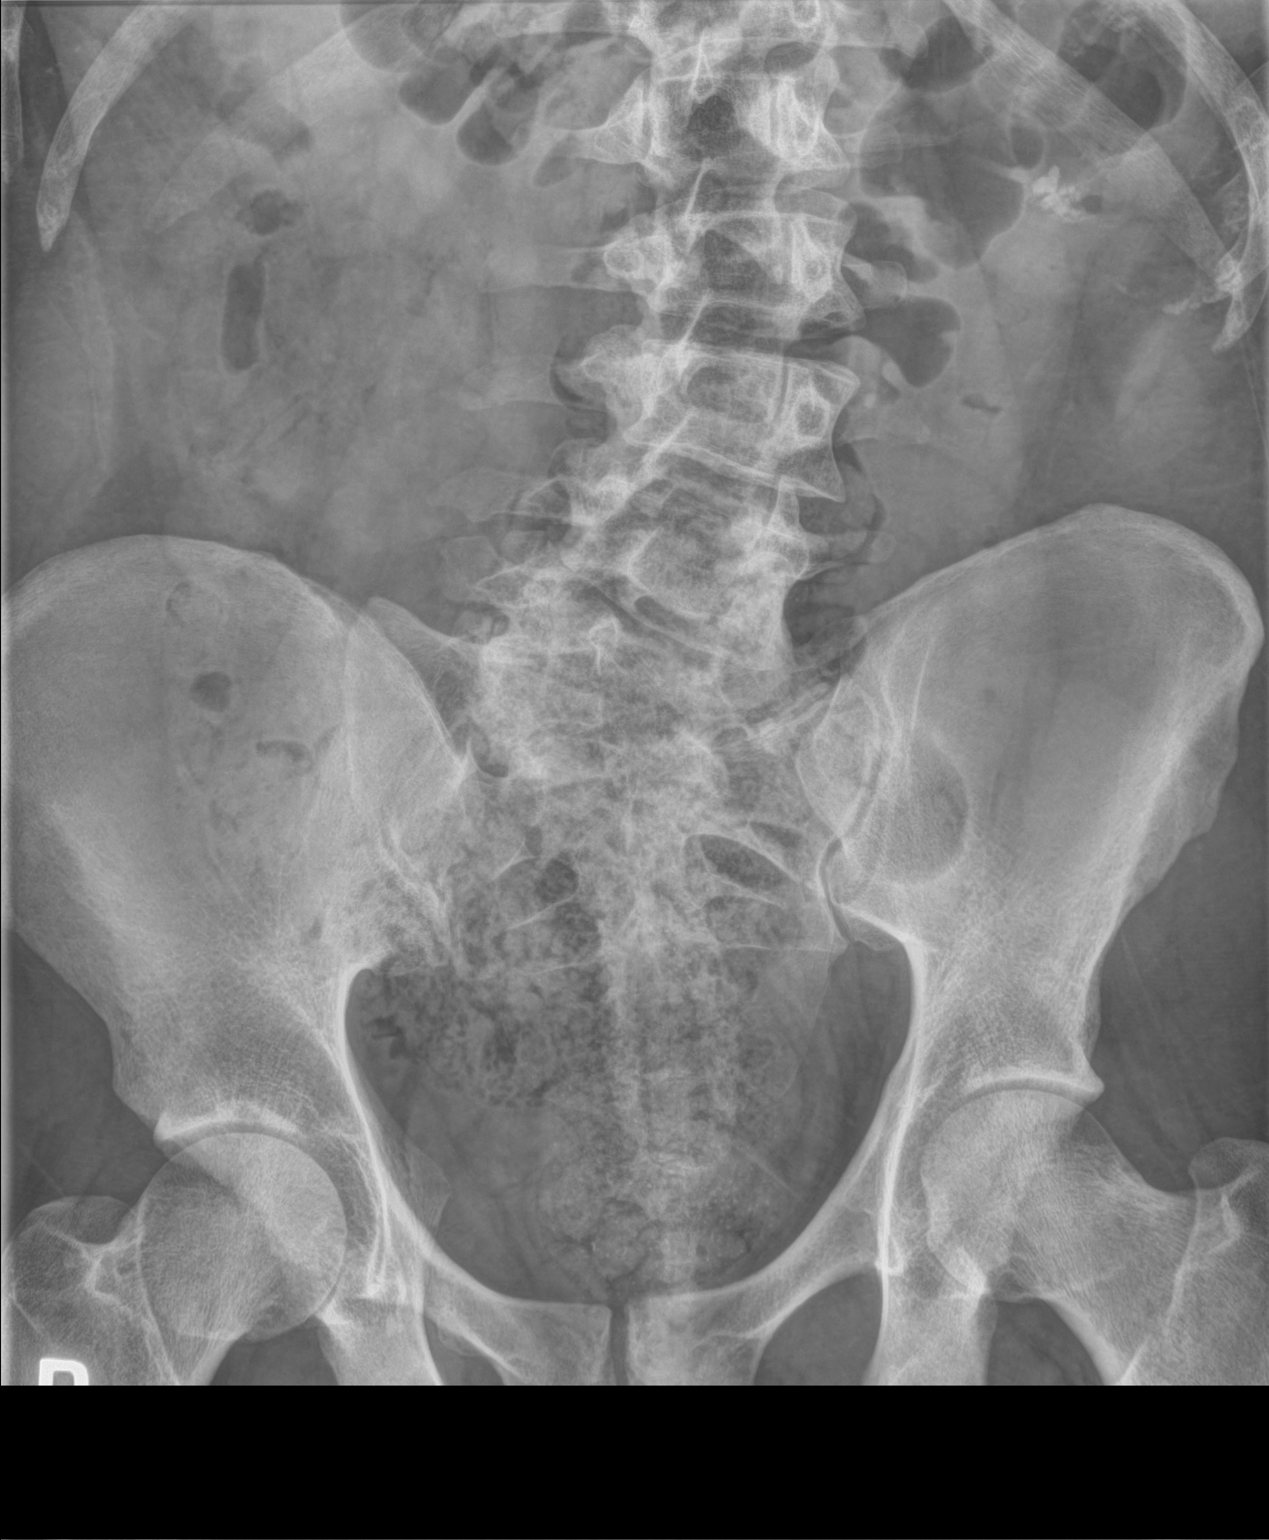

[1 of 1 positions shown; findings below may reference images not displayed]

FINDINGS: Multiple densities project in the left mid abdomen, appearing more
tightly clustered possibly more numerous than on the prior exam.
There is subtle density that projects the left of L3, not evident
previously and possibly representing a proximal ureteral stone.

Moderate increased stool is noted in the rectosigmoid. No evidence
of bowel obstruction.

Stable levoscoliosis of the lumbar spine.
IMPRESSION: Possible small proximal left ureteral stone. Multiple densities
project in the lower pole of the left kidney with a mild change in
number and configuration from the prior study consistent with
interval lithotripsy.

## 2015-07-31 ENCOUNTER — Encounter (HOSPITAL_BASED_OUTPATIENT_CLINIC_OR_DEPARTMENT_OTHER): Payer: Self-pay | Admitting: *Deleted

## 2015-08-01 ENCOUNTER — Encounter (HOSPITAL_BASED_OUTPATIENT_CLINIC_OR_DEPARTMENT_OTHER): Payer: Self-pay | Admitting: *Deleted

## 2015-08-01 NOTE — Progress Notes (Signed)
NPO AFTER MN.  ARRIVE AT 0830.  CURRENT LAB RESULTS IN CHART AND EPIC.  MAY TAKE DILAUDID/ ZOFRAN IF NEEDED AM DOS W/ SIPS OF WATER.

## 2015-08-06 NOTE — H&P (Signed)
History of Present Illness Mr Thomas Howell underwent ESWL and subsequent ureteroscopy for a left ureteral and left lower pole stone. The left ureteral stone was successfully treated but due to inflammation etc, the lower pole stone could not be removed and stent was left in place. He presents today for further evaluation and planning of care. He is doing fair. He continues with frequency, gross hematuria, and left renal colic. He has been managing his symptoms with hydromorphone and phenergan. He is trying to remain hydrated and remains afebrile. He does have a history of kidney stones.   Surgical History Problems  1. History of Cystoscopy With Insertion Of Ureteral Stent Left 2. History of Cystoscopy With Insertion Of Ureteral Stent Left 3. History of Cystoscopy With Insertion Of Ureteral Stent Left 4. History of Cystoscopy With Ureteroscopy With Lithotripsy 5. History of Cystoscopy With Ureteroscopy With Lithotripsy 6. History of Foot Surgery 7. History of Lithotripsy 8. History of Lithotripsy 9. History of Lymphadenectomy  Current Meds 1. Aleve TABS;  Therapy: (Recorded:05Feb2016) to Recorded 2. HYDROmorphone HCl - 2 MG Oral Tablet; TAKE 1 TABLET EVERY 4 TO 6 HOURS AS  NEEDED FOR PAIN;  Therapy: 05Jan2016 to (Evaluate:09Jan2016); Last Rx:05Jan2016 Ordered 3. Phenergan TABS (Promethazine HCl);  Therapy: (Recorded:06Jul2015) to Recorded 4. Tamsulosin HCl - 0.4 MG Oral Capsule; TAKE 1 CAPSULE Daily;  Therapy: 05Jan2016 to (Evaluate:04Feb2016)  Requested for: 05Jan2016; Last  Rx:05Jan2016 Ordered  Allergies Medication  1. Codeine Derivatives 2. Hydrocodone-Acetaminophen CAPS Non-Medication  3. Adhesive Tape  Family History Problems  1. Family history of Blood In Urine : Mother 2. Family history of Blood In Urine : Brother 3. Family history of Family Health Status Number Of Children   1 brother 4. Family history of Nephrolithiasis : Mother, Brother  Social History Problems  1.  Alcohol Use   social 2. Caffeine Use   3 glasses a day 3. Current smoker on some days (F17.210)   social 4. Marital History - Single 5. Occupation:   Consulting civil engineer 6. Denied: History of Tobacco Use  Review of Systems Genitourinary, constitutional, skin, eye, otolaryngeal, hematologic/lymphatic, cardiovascular, pulmonary, endocrine, musculoskeletal, gastrointestinal, neurological and psychiatric system(s) were reviewed and pertinent findings if present are noted and are otherwise negative.  Genitourinary: urinary frequency and hematuria.  Gastrointestinal: nausea and flank pain.  Constitutional: feeling poorly (malaise) and feeling tired (fatigue).  Musculoskeletal: back pain.    Vitals Vital Signs [Data Includes: Last 1 Day]  Recorded: 02Aug2016 08:15AM  Blood Pressure: 101 / 66 Heart Rate: 66  Physical Exam Constitutional: Well nourished and well developed . No acute distress.  ENT:. The ears and nose are normal in appearance.  Neck: The appearance of the neck is normal and no neck mass is present.  Pulmonary: No respiratory distress and normal respiratory rhythm and effort.  Cardiovascular: Heart rate and rhythm are normal . No peripheral edema.  Abdomen: The abdomen is soft and nontender. No masses are palpated. Left CVA tenderness. No hernias are palpable. No hepatosplenomegaly noted.  Genitourinary: Examination of the penis demonstrates no discharge, no masses, no lesions and a normal meatus. The scrotum is without lesions. The right epididymis is palpably normal and non-tender. The left epididymis is palpably normal and non-tender. The right testis is non-tender and without masses. The left testis is non-tender and without masses.  Lymphatics: The femoral and inguinal nodes are not enlarged or tender.  Skin: Normal skin turgor, no visible rash and no visible skin lesions.  Neuro/Psych:. Mood and affect are appropriate.  Results/Data Urine [Data Includes: Last 1 Day]    02Aug2016  COLOR RED   APPEARANCE TURBID   SPECIFIC GRAVITY 1.015   pH 5.5   GLUCOSE NEGATIVE   BILIRUBIN NEGATIVE   KETONE NEGATIVE   BLOOD 3+   PROTEIN 2+   NITRITE POSITIVE   LEUKOCYTE ESTERASE 1+   SQUAMOUS EPITHELIAL/HPF NONE SEEN HPF  WBC 0-5 WBC/HPF  RBC >60 RBC/HPF  BACTERIA FEW HPF  CRYSTALS See Below HPF  CASTS NONE SEEN LPF  Yeast NONE SEEN HPF   Old records or history reviewed: CT and KUB from July.  The following images/tracing/specimen were independently visualized:  KUB: The right renal shadow is grossly obscured by bowel gas. No lithiasis noted in the right shadow. There remains a large calculus in the left lower pole. A proximal coil of a double J stent is noted in the left renal collecting system. No lithiasis noted along the expected track of the left ureter using the stent as a guideline. The distal coil of the stent is noted to be in the bladder whichh is grossly clear. Scoliosis noted in the lumbar spine.  The following clinical lab reports were reviewed:  UA with gross hematuria. Sent for culture.    Assessment Assessed  1. Kidney stone on left side (N20.0) 2. Gross hematuria (R31.0)  Plan Health Maintenance  1. UA With REFLEX; [Do Not Release]; Status:Complete;   Done: 02Aug2016 07:49AM Kidney stone on left side  2. Start: Meloxicam 15 MG Oral Tablet; TAKE 1 TABLET DAILY AS NEEDED 3. Start: Promethazine HCl - 25 MG Oral Tablet; TAKE 1 TABLET EVERY 3 TO 4 HOURS AS  NEEDED FOR NAUSEA AND VOMITING 4. Start: TraMADol HCl - 50 MG Oral Tablet; TAKE 1 OR 2 TABLETS BY MOUTH EVERY 4 TO  6 HOURS AS NEEDED 5. Follow-up Schedule Surgery Office  Follow-up  Status: Hold For - Appointment   Requested for: 02Aug2016  Discussion/Summary I described the risks which include heart attack, stroke, pulmonary embolus, death, bleeding, infection, damage to contiguous structures, positioning injury, ureteral stricture, ureteral avulsion, ureteral injury, need for ureteral  stent, inability to perform ureteroscopy, need for an interval procedure, inability to clear stone burden, stent discomfort and pain. He would like to proceed with a second ureteroscopy to attempt removal of the left lower pole stone. I will refill his pain medications and culture his urine. AUS/ED f/u information regarding fever, uncontrollable pain, gross hematuria/urinary retention in the presence of new or worsening LUTS. AUS/ED f/u information regarding fever, uncontrollable pain, gross hematuria/urinary retention in the presence of new or worsening LUTS.        Signatures Electronically signed by : Anne Fu, Dyann Ruddle; Jul 22 2015 10:16AM EST   Addendum: I discussed the patient with NP Hollice Espy And agree with his assessment and plan. I reviewed the patients notes, labs and images.  Urine culture was negative.

## 2015-08-08 ENCOUNTER — Ambulatory Visit (HOSPITAL_BASED_OUTPATIENT_CLINIC_OR_DEPARTMENT_OTHER): Payer: 59 | Admitting: Anesthesiology

## 2015-08-08 ENCOUNTER — Encounter (HOSPITAL_BASED_OUTPATIENT_CLINIC_OR_DEPARTMENT_OTHER): Payer: Self-pay | Admitting: *Deleted

## 2015-08-08 ENCOUNTER — Ambulatory Visit (HOSPITAL_BASED_OUTPATIENT_CLINIC_OR_DEPARTMENT_OTHER)
Admission: RE | Admit: 2015-08-08 | Discharge: 2015-08-08 | Disposition: A | Discharge status: Home / Self Care | Payer: 59 | Source: Ambulatory Visit | Attending: Urology | Admitting: Urology

## 2015-08-08 ENCOUNTER — Encounter (HOSPITAL_BASED_OUTPATIENT_CLINIC_OR_DEPARTMENT_OTHER)
Admission: RE | Disposition: A | Discharge status: Home / Self Care | Payer: Self-pay | Source: Ambulatory Visit | Attending: Urology

## 2015-08-08 DIAGNOSIS — Z79891 Long term (current) use of opiate analgesic: Secondary | ICD-10-CM | POA: Diagnosis not present

## 2015-08-08 DIAGNOSIS — F17200 Nicotine dependence, unspecified, uncomplicated: Secondary | ICD-10-CM | POA: Diagnosis not present

## 2015-08-08 DIAGNOSIS — Z791 Long term (current) use of non-steroidal anti-inflammatories (NSAID): Secondary | ICD-10-CM | POA: Insufficient documentation

## 2015-08-08 DIAGNOSIS — Z79899 Other long term (current) drug therapy: Secondary | ICD-10-CM | POA: Diagnosis not present

## 2015-08-08 DIAGNOSIS — N200 Calculus of kidney: Secondary | ICD-10-CM | POA: Diagnosis not present

## 2015-08-08 DIAGNOSIS — S3712XA Contusion of ureter, initial encounter: Secondary | ICD-10-CM | POA: Insufficient documentation

## 2015-08-08 DIAGNOSIS — Z87442 Personal history of urinary calculi: Secondary | ICD-10-CM | POA: Diagnosis not present

## 2015-08-08 DIAGNOSIS — X58XXXA Exposure to other specified factors, initial encounter: Secondary | ICD-10-CM | POA: Insufficient documentation

## 2015-08-08 HISTORY — DX: Personal history of urinary calculi: Z87.442

## 2015-08-08 HISTORY — PX: HOLMIUM LASER APPLICATION: SHX5852

## 2015-08-08 HISTORY — PX: CYSTOSCOPY W/ URETERAL STENT PLACEMENT: SHX1429

## 2015-08-08 HISTORY — PX: CYSTOSCOPY/RETROGRADE/URETEROSCOPY/STONE EXTRACTION WITH BASKET: SHX5317

## 2015-08-08 SURGERY — HOLMIUM LASER APPLICATION
Anesthesia: General | Site: Ureter | Laterality: Left

## 2015-08-08 MED ORDER — HYDROMORPHONE HCL 4 MG PO TABS: 4.0000 mg | ORAL_TABLET | Freq: Four times a day (QID) | ORAL | Status: DC | PRN

## 2015-08-08 MED ORDER — FENTANYL CITRATE (PF) 100 MCG/2ML IJ SOLN
INTRAMUSCULAR | Status: DC | PRN
  Administered 2015-08-08 (×2): 50 ug via INTRAVENOUS
  Administered 2015-08-08: 100 ug via INTRAVENOUS

## 2015-08-08 MED ORDER — IOHEXOL 350 MG/ML SOLN
INTRAVENOUS | Status: DC | PRN
  Administered 2015-08-08: 5 mL via URETHRAL

## 2015-08-08 MED ORDER — PHENAZOPYRIDINE HCL 100 MG PO TABS
ORAL_TABLET | ORAL | Status: AC
Start: 2015-08-08 — End: 2015-08-08
  Filled 2015-08-08: qty 2

## 2015-08-08 MED ORDER — FENTANYL CITRATE (PF) 100 MCG/2ML IJ SOLN
INTRAMUSCULAR | Status: AC
  Filled 2015-08-08: qty 2

## 2015-08-08 MED ORDER — FENTANYL CITRATE (PF) 100 MCG/2ML IJ SOLN
25.0000 ug | INTRAMUSCULAR | Status: DC | PRN
  Administered 2015-08-08: 50 ug via INTRAVENOUS
  Filled 2015-08-08: qty 1

## 2015-08-08 MED ORDER — BELLADONNA ALKALOIDS-OPIUM 16.2-60 MG RE SUPP
RECTAL | Status: AC
  Filled 2015-08-08: qty 1

## 2015-08-08 MED ORDER — PHENAZOPYRIDINE HCL 200 MG PO TABS
200.0000 mg | ORAL_TABLET | Freq: Three times a day (TID) | ORAL | Status: DC
  Administered 2015-08-08: 200 mg via ORAL
  Filled 2015-08-08: qty 1

## 2015-08-08 MED ORDER — PROPOFOL 10 MG/ML IV BOLUS
INTRAVENOUS | Status: DC | PRN
  Administered 2015-08-08: 200 mg via INTRAVENOUS

## 2015-08-08 MED ORDER — ONDANSETRON HCL 4 MG/2ML IJ SOLN
INTRAMUSCULAR | Status: DC | PRN
  Administered 2015-08-08: 4 mg via INTRAVENOUS

## 2015-08-08 MED ORDER — FENTANYL CITRATE (PF) 100 MCG/2ML IJ SOLN
INTRAMUSCULAR | Status: AC
Start: 2015-08-08 — End: 2015-08-08
  Filled 2015-08-08: qty 2

## 2015-08-08 MED ORDER — ACETAMINOPHEN 10 MG/ML IV SOLN
INTRAVENOUS | Status: DC | PRN
  Administered 2015-08-08: 1000 mg via INTRAVENOUS

## 2015-08-08 MED ORDER — LACTATED RINGERS IV SOLN
INTRAVENOUS | Status: DC
  Administered 2015-08-08: 13:00:00 via INTRAVENOUS
  Filled 2015-08-08: qty 1000

## 2015-08-08 MED ORDER — CEFAZOLIN SODIUM-DEXTROSE 2-3 GM-% IV SOLR
2.0000 g | INTRAVENOUS | Status: AC
  Administered 2015-08-08: 2 g via INTRAVENOUS
  Filled 2015-08-08: qty 50

## 2015-08-08 MED ORDER — LIDOCAINE HCL (CARDIAC) 20 MG/ML IV SOLN
INTRAVENOUS | Status: DC | PRN
  Administered 2015-08-08: 80 mg via INTRAVENOUS

## 2015-08-08 MED ORDER — FENTANYL CITRATE (PF) 100 MCG/2ML IJ SOLN
100.0000 ug | Freq: Once | INTRAMUSCULAR | Status: AC
  Administered 2015-08-08 (×2): 50 ug via INTRAVENOUS
  Filled 2015-08-08: qty 2

## 2015-08-08 MED ORDER — DEXAMETHASONE SODIUM PHOSPHATE 4 MG/ML IJ SOLN
INTRAMUSCULAR | Status: DC | PRN
  Administered 2015-08-08: 10 mg via INTRAVENOUS

## 2015-08-08 MED ORDER — PHENAZOPYRIDINE HCL 200 MG PO TABS: 200.0000 mg | ORAL_TABLET | Freq: Three times a day (TID) | ORAL | Status: DC | PRN

## 2015-08-08 MED ORDER — FENTANYL CITRATE (PF) 100 MCG/2ML IJ SOLN
INTRAMUSCULAR | Status: AC
  Filled 2015-08-08: qty 4

## 2015-08-08 MED ORDER — BELLADONNA ALKALOIDS-OPIUM 16.2-60 MG RE SUPP
RECTAL | Status: DC | PRN
  Administered 2015-08-08: 1 via RECTAL

## 2015-08-08 MED ORDER — MIDAZOLAM HCL 5 MG/5ML IJ SOLN
INTRAMUSCULAR | Status: DC | PRN
  Administered 2015-08-08: 2 mg via INTRAVENOUS

## 2015-08-08 MED ORDER — LACTATED RINGERS IV SOLN
INTRAVENOUS | Status: DC
  Administered 2015-08-08: 09:00:00 via INTRAVENOUS
  Filled 2015-08-08: qty 1000

## 2015-08-08 MED ORDER — NITROFURANTOIN MONOHYD MACRO 100 MG PO CAPS: 100.0000 mg | ORAL_CAPSULE | Freq: Every day | ORAL | Status: DC

## 2015-08-08 MED ORDER — 0.9 % SODIUM CHLORIDE (POUR BTL) OPTIME
TOPICAL | Status: DC | PRN
  Administered 2015-08-08: 100 mL

## 2015-08-08 MED ORDER — SODIUM CHLORIDE 0.9 % IR SOLN
Status: DC | PRN
  Administered 2015-08-08: 3000 mL

## 2015-08-08 MED ORDER — CEFAZOLIN SODIUM-DEXTROSE 2-3 GM-% IV SOLR
INTRAVENOUS | Status: AC
  Filled 2015-08-08: qty 50

## 2015-08-08 MED ORDER — MIDAZOLAM HCL 2 MG/2ML IJ SOLN
INTRAMUSCULAR | Status: AC
  Filled 2015-08-08: qty 2

## 2015-08-08 SURGICAL SUPPLY — 40 items
ADAPTER CATH URET PLST 4-6FR (CATHETERS) IMPLANT
BAG DRAIN URO-CYSTO SKYTR STRL (DRAIN) ×3 IMPLANT
BASKET LASER NITINOL 1.9FR (BASKET) IMPLANT
BASKET STNLS GEMINI 4WIRE 3FR (BASKET) IMPLANT
BASKET STONE 1.7 NGAGE (UROLOGICAL SUPPLIES) ×3 IMPLANT
BASKET ZERO TIP NITINOL 2.4FR (BASKET) IMPLANT
BENZOIN TINCTURE PRP APPL 2/3 (GAUZE/BANDAGES/DRESSINGS) ×3 IMPLANT
CANISTER SUCT LVC 12 LTR MEDI- (MISCELLANEOUS) IMPLANT
CATH INTERMIT  6FR 70CM (CATHETERS) IMPLANT
CATH URET 5FR 28IN CONE TIP (BALLOONS)
CATH URET 5FR 28IN OPEN ENDED (CATHETERS) IMPLANT
CATH URET 5FR 70CM CONE TIP (BALLOONS) IMPLANT
CATH URET DUAL LUMEN 6-10FR 50 (CATHETERS) IMPLANT
CLOTH BEACON ORANGE TIMEOUT ST (SAFETY) ×3 IMPLANT
DRSG TEGADERM 2-3/8X2-3/4 SM (GAUZE/BANDAGES/DRESSINGS) ×3 IMPLANT
ELECT REM PT RETURN 9FT ADLT (ELECTROSURGICAL)
ELECTRODE REM PT RTRN 9FT ADLT (ELECTROSURGICAL) IMPLANT
FIBER LASER FLEXIVA 200 (UROLOGICAL SUPPLIES) ×3 IMPLANT
GLOVE BIO SURGEON STRL SZ 6.5 (GLOVE) ×3 IMPLANT
GLOVE BIO SURGEON STRL SZ7.5 (GLOVE) ×3 IMPLANT
GLOVE INDICATOR 6.5 STRL GRN (GLOVE) ×6 IMPLANT
GOWN STRL REUS W/ TWL LRG LVL3 (GOWN DISPOSABLE) ×2 IMPLANT
GOWN STRL REUS W/ TWL XL LVL3 (GOWN DISPOSABLE) ×2 IMPLANT
GOWN STRL REUS W/TWL LRG LVL3 (GOWN DISPOSABLE) ×1
GOWN STRL REUS W/TWL XL LVL3 (GOWN DISPOSABLE) ×1
GUIDEWIRE 0.038 PTFE COATED (WIRE) IMPLANT
GUIDEWIRE ANG ZIPWIRE 038X150 (WIRE) ×3 IMPLANT
GUIDEWIRE STR DUAL SENSOR (WIRE) ×3 IMPLANT
IV NS IRRIG 3000ML ARTHROMATIC (IV SOLUTION) ×6 IMPLANT
KIT BALLIN UROMAX 15FX10 (LABEL) IMPLANT
KIT BALLN UROMAX 15FX4 (MISCELLANEOUS) IMPLANT
KIT BALLN UROMAX 26 75X4 (MISCELLANEOUS)
MANIFOLD NEPTUNE II (INSTRUMENTS) ×3 IMPLANT
NS IRRIG 500ML POUR BTL (IV SOLUTION) ×3 IMPLANT
PACK CYSTO (CUSTOM PROCEDURE TRAY) ×3 IMPLANT
SET HIGH PRES BAL DIL (LABEL)
SHEATH ACCESS URETERAL 38CM (SHEATH) IMPLANT
STENT URET 6FRX26 CONTOUR (STENTS) ×3 IMPLANT
SYRINGE 10CC LL (SYRINGE) ×3 IMPLANT
SYRINGE IRR TOOMEY STRL 70CC (SYRINGE) IMPLANT

## 2015-08-08 NOTE — Discharge Instructions (Signed)
Ureteral Stent Implantation, Care After Refer to this sheet in the next few weeks. These instructions provide you with information on caring for yourself after your procedure. Your health care provider may also give you more specific instructions. Your treatment has been planned according to current medical practices, but problems sometimes occur. Call your health care provider if you have any problems or questions after your procedure. WHAT TO EXPECT AFTER THE PROCEDURE You should be back to normal activity within 48 hours after the procedure. Nausea and vomiting may occur and are commonly the result of anesthesia. It is common to experience sharp pain in the back or lower abdomen and penis with voiding. This is caused by movement of the ends of the stent with the act of urinating.It usually goes away within minutes after you have stopped urinating. HOME CARE INSTRUCTIONS Make sure to drink plenty of fluids. You may have small amounts of bleeding, causing your urine to be red. This is normal. Certain movements may trigger pain or a feeling that you need to urinate. You may be given medicines to prevent infection or bladder spasms. Be sure to take all medicines as directed. Only take over-the-counter or prescription medicines for pain, discomfort, or fever as directed by your health care provider. Do not take aspirin, as this can make bleeding worse.  Removal of the stent: Remove the stent by pulling the string next Thursday, Aug 14, 2015.    Be sure to keep all follow-up appointments so your health care provider can check that you are healing properly.  SEEK MEDICAL CARE IF:  You experience increasing pain.  Your pain medicine is not working. SEEK IMMEDIATE MEDICAL CARE IF:  Your urine is dark red or has blood clots.  You are leaking urine (incontinent).  You have a fever, chills, feeling sick to your stomach (nausea), or vomiting.  Your pain is not relieved by pain medicine.  The end of  the stent comes out of the urethra.  You are unable to urinate. Document Released: 08/08/2013 Document Revised: 12/11/2013 Document Reviewed: 08/08/2013 [facility] Patient Information 2015 [place], [place]. This information is not intended to replace advice given to you by your health care provider. Make sure you discuss any questions you have with your health care provider.     Post Anesthesia Home Care Instructions  Activity: Get plenty of rest for the remainder of the day. A responsible adult should stay with you for 24 hours following the procedure.  For the next 24 hours, DO NOT: -Drive a car -Advertising copywriter -Drink alcoholic beverages -Take any medication unless instructed by your physician -Make any legal decisions or sign important papers.  Meals: Start with liquid foods such as gelatin or soup. Progress to regular foods as tolerated. Avoid greasy, spicy, heavy foods. If nausea and/or vomiting occur, drink only clear liquids until the nausea and/or vomiting subsides. Call your physician if vomiting continues.  Special Instructions/Symptoms: Your throat may feel dry or sore from the anesthesia or the breathing tube placed in your throat during surgery. If this causes discomfort, gargle with warm salt water. The discomfort should disappear within 24 hours.  If you had a scopolamine patch placed behind your ear for the management of post- operative nausea and/or vomiting:  1. The medication in the patch is effective for 72 hours, after which it should be removed.  Wrap patch in a tissue and discard in the trash. Wash hands thoroughly with soap and water. 2. You may remove the patch earlier  than 72 hours if you experience unpleasant side effects which may include dry mouth, dizziness or visual disturbances. 3. Avoid touching the patch. Wash your hands with soap and water after contact with the patch.

## 2015-08-08 NOTE — Interval H&P Note (Signed)
History and Physical Interval Note:  08/08/2015 9:59 AM  Thomas Howell  has presented today for surgery, with the diagnosis of LEFT LOWER POLE LITHIASIS   The various methods of treatment have been discussed with the patient and family. After consideration of risks, benefits and other options for treatment, the patient has consented to  Procedure(s): CYSTOSCOPY WITH LEFT URETEROSCOPY, BASKET EXTRACTION  AND STENT PLACEMENT (Left) HOLMIUM LASER  WITH LITHOTRIPSY  (Left) as a surgical intervention .  The patient's history has been reviewed, patient examined, no change in status, stable for surgery.  I have reviewed the patient's chart and labs.  I discussed with the patient the nature, potential benefits, risks and alternatives (remove stent/surveillance, PCNL) to the above, including side effects of the proposed treatment, the likelihood of the patient achieving the goals of the procedure, and any potential problems that might occur during the procedure or recuperation. Discussed need for staged procedure, continued stenting among others. Questions were answered to the patient's satisfaction.  Patient elects to proceed.    [name]

## 2015-08-08 NOTE — Anesthesia Postprocedure Evaluation (Signed)
  Anesthesia Post-op Note  Patient: Thomas Howell  Procedure(s) Performed: Procedure(s) (LRB): HOLMIUM LASER  WITH LITHOTRIPSY  (Left) CYSTOSCOPY/RETROGRADE/URETEROSCOPY/STONE EXTRACTION WITH BASKET (Left) CYSTOSCOPY WITH STENT REPLACEMENT (Left)  Patient Location: PACU  Anesthesia Type: General  Level of Consciousness: awake and alert   Airway and Oxygen Therapy: Patient Spontanous Breathing  Post-op Pain: mild  Post-op Assessment: Post-op Vital signs reviewed, Patient's Cardiovascular Status Stable, Respiratory Function Stable, Patent Airway and No signs of Nausea or vomiting  Last Vitals:  Filed Vitals:   08/08/15 1230  BP: 131/85  Pulse: 66  Temp:   Resp: 12    Post-op Vital Signs: stable   Complications: No apparent anesthesia complications

## 2015-08-08 NOTE — OR Nursing (Signed)
Left ureteral stone giving to the PACU nurse.

## 2015-08-08 NOTE — Op Note (Signed)
Preoperative diagnosis: Left renal stone Postoperative diagnosis:Left renal stone, left proximal ureteral mucosal abrasion in location of prior proximal stone  Procedure: Cystoscopy, left retrograde pyelogram, Left ureteroscopy with holmium laser lithotripsy, stone basket extraction, left ureteral stent placement  Surgeon: Mena Goes  Anesthesia: General  Indication of procedure: 24 year old treated for a large impacted left proximal ureteral stone about a month ago with a residual large left lower pole stone.  He's been stented for a few weeks and presents for management of the left renal stone.  Procedure the left proximal ureter was very narrow and edematous at the location of the prior stone which limited some of the scope movement into the left lower pole.  Since that time the left lower pole stone underwent shockwave lithotripsy which did reduce the size of the stone.  He was brought today for management of the left renal stone in lieu of a PCNL or continued surveillance.  Findings:  Fairly encrusted stent despite being in only 4 weeks.  Left retrograde pyelogram outlined a dilated collecting system and renal pelvis.  There were no filling defects  On ureteroscopy in the left proximal ureter where the stone was noted to be impacted a month ago and where there was a lot of edema and narrowing from the stone was noted to have a mucosal abnormality.  The remainder of the collecting system, ureter was free of any sign of abnormality or stone fragment.  Description of procedure: After consent was obtained the patient was brought to the operating room.  After adequate anesthesia was placed in lithotomy position and prepped and draped in the usual sterile fashion.A timeout was performed from the patient and procedure.  The cystoscope was passed per urethra and the left ureteral stent grasped and removed through the urethral meatus.  It was quite encrusted in the distal coil sprung open and not  some stone off.  I could not get any wire through the stent.  The stent also uncoiled but would not come through the proximal ureter.  I did not want to put a lot of pressure on the stent and therefore I pushed it back into the bladder by advancing the cystoscope.  Adjacent to the stent passed a Glidewire without difficulty.  I then re-grasp the stent and was able to remove it without difficulty but I was surprised as it was quite encrusted along the length of the stent despite only being in a few weeks.  Using the access sheath to wires were placed without difficulty.  I went up over the sensor wire leaving the Glidewire as a safety wire.  The ureteroscope was advanced in the lower pole stone was located.  It had significantly reduced in size due to the prior shockwave lithotripsy.  I grasped one or 2 small fragments and remove these from the lower pole.  I then grasped the large stone and brought it to the upper pole where it would be easier to fragment and remove the pieces.  It was then mostly dusted at a setting of 0.4 and 50 but once it would go down to smaller more manageable pieces of broken up at a setting of 0.8 and 8.  I removed all of the significant fragments and only a few 1 mm or less fragments remained.  The remainder of the collecting system was free of any visible stone.  I then withdrew the ureteroscope and access sheath together and we distended been impacted in the left proximal ureter there was an abrasion or  opening in the mucosa in this very area.  A photo was taken.  The remainder of the ureter was unremarkable.  TheWire was backloaded on the cystoscope and a 6 x 26 cm stent was advanced.  The wire was removed with a good coil seen in the kidney and a good coil of the bladder.  A string was left on the stent.  The patient was awakened taken to recovery room in stable condition.  Complications: None Blood loss: Minimal  Specimens: Stone fragments to office lab  Drains: 6 x 26 cm left  ureteral stent with string

## 2015-08-08 NOTE — Transfer of Care (Signed)
Immediate Anesthesia Transfer of Care Note  Patient: Thomas Howell  Procedure(s) Performed: Procedure(s): HOLMIUM LASER  WITH LITHOTRIPSY  (Left) CYSTOSCOPY/RETROGRADE/URETEROSCOPY/STONE EXTRACTION WITH BASKET (Left) CYSTOSCOPY WITH STENT REPLACEMENT (Left)  Patient Location: PACU  Anesthesia Type:General  Level of Consciousness: awake, alert  and oriented  Airway & Oxygen Therapy: Patient Spontanous Breathing and Patient connected to nasal cannula oxygen  Post-op Assessment: Report given to RN  Post vital signs: Reviewed and stable  Last Vitals:  Filed Vitals:   08/08/15 0802  BP: 122/73  Pulse: 59  Temp: 36.9 C  Resp: 18    Complications: No apparent anesthesia complications

## 2015-08-08 NOTE — Anesthesia Procedure Notes (Signed)
Procedure Name: LMA Insertion Date/Time: 08/08/2015 10:21 AM Performed by: Maris Berger T Pre-anesthesia Checklist: Patient identified, Emergency Drugs available, Suction available and Patient being monitored Patient Re-evaluated:Patient Re-evaluated prior to inductionOxygen Delivery Method: Circle System Utilized Preoxygenation: Pre-oxygenation with 100% oxygen Intubation Type: IV induction Ventilation: Mask ventilation without difficulty LMA: LMA inserted LMA Size: 5.0 Number of attempts: 1 Airway Equipment and Method: Bite block Placement Confirmation: positive ETCO2 Tube secured with: Tape Dental Injury: Teeth and Oropharynx as per pre-operative assessment

## 2015-08-08 NOTE — Brief Op Note (Signed)
08/08/2015  11:56 AM  PATIENT:  Thomas Howell  24 y.o. male  PRE-OPERATIVE DIAGNOSIS:  LEFT LOWER POLE LITHIASIS   POST-OPERATIVE DIAGNOSIS:  LEFT LOWER POLE LITHIASIS   PROCEDURE:  Procedure(s): HOLMIUM LASER  WITH LITHOTRIPSY  (Left) CYSTOSCOPY/RETROGRADE/URETEROSCOPY/STONE EXTRACTION WITH BASKET (Left) CYSTOSCOPY WITH STENT REPLACEMENT (Left)  SURGEON:  Surgeon(s) and Role:    * Jerilee Field, MD - Primary  ANESTHESIA:   general  EBL:  Total I/O In: 100 [I.V.:100] Out: -    DRAINS: 6 x 26 cm left ureteral stent with string     PATIENT DISPOSITION:  PACU - hemodynamically stable.

## 2015-08-08 NOTE — Anesthesia Preprocedure Evaluation (Addendum)
Anesthesia Evaluation  Patient identified by MRN, date of birth, ID band Patient awake    Reviewed: Allergy & Precautions, H&P , NPO status , Patient's Chart, lab work & pertinent test results  History of Anesthesia Complications (+) PONV  Airway Mallampati: II  TM Distance: >3 FB Neck ROM: Full    Dental no notable dental hx. (+) Dental Advisory Given, Teeth Intact   Pulmonary neg pulmonary ROS,  breath sounds clear to auscultation  Pulmonary exam normal       Cardiovascular negative cardio ROS Normal cardiovascular examRhythm:Regular Rate:Normal     Neuro/Psych  Headaches, negative neurological ROS  negative psych ROS   GI/Hepatic negative GI ROS, Neg liver ROS,   Endo/Other  negative endocrine ROS  Renal/GU Renal diseasenegative Renal ROS  negative genitourinary   Musculoskeletal negative musculoskeletal ROS (+)   Abdominal   Peds negative pediatric ROS (+)  Hematology negative hematology ROS (+)   Anesthesia Other Findings   Reproductive/Obstetrics negative OB ROS                           Anesthesia Physical Anesthesia Plan  ASA: I  Anesthesia Plan: General   Post-op Pain Management:    Induction: Intravenous  Airway Management Planned: LMA  Additional Equipment:   Intra-op Plan:   Post-operative Plan:   Informed Consent:   Plan Discussed with: Surgeon  Anesthesia Plan Comments:         Anesthesia Quick Evaluation

## 2015-08-12 ENCOUNTER — Encounter (HOSPITAL_BASED_OUTPATIENT_CLINIC_OR_DEPARTMENT_OTHER): Payer: Self-pay | Admitting: Urology

## 2015-08-18 ENCOUNTER — Observation Stay (HOSPITAL_COMMUNITY)
Admission: EM | Admit: 2015-08-18 | Discharge: 2015-08-19 | Disposition: A | Discharge status: Home / Self Care | Payer: 59 | Attending: Internal Medicine | Admitting: Internal Medicine

## 2015-08-18 ENCOUNTER — Emergency Department (HOSPITAL_COMMUNITY): Payer: 59

## 2015-08-18 ENCOUNTER — Encounter (HOSPITAL_COMMUNITY): Payer: Self-pay

## 2015-08-18 DIAGNOSIS — Z960 Presence of urogenital implants: Secondary | ICD-10-CM | POA: Insufficient documentation

## 2015-08-18 DIAGNOSIS — N132 Hydronephrosis with renal and ureteral calculous obstruction: Secondary | ICD-10-CM | POA: Diagnosis not present

## 2015-08-18 DIAGNOSIS — Z79891 Long term (current) use of opiate analgesic: Secondary | ICD-10-CM | POA: Diagnosis not present

## 2015-08-18 DIAGNOSIS — N200 Calculus of kidney: Secondary | ICD-10-CM | POA: Insufficient documentation

## 2015-08-18 DIAGNOSIS — Z79899 Other long term (current) drug therapy: Secondary | ICD-10-CM | POA: Insufficient documentation

## 2015-08-18 DIAGNOSIS — R109 Unspecified abdominal pain: Secondary | ICD-10-CM | POA: Diagnosis present

## 2015-08-18 DIAGNOSIS — Z791 Long term (current) use of non-steroidal anti-inflammatories (NSAID): Secondary | ICD-10-CM | POA: Diagnosis not present

## 2015-08-18 DIAGNOSIS — N1330 Unspecified hydronephrosis: Secondary | ICD-10-CM | POA: Insufficient documentation

## 2015-08-18 DIAGNOSIS — Z87442 Personal history of urinary calculi: Secondary | ICD-10-CM | POA: Diagnosis not present

## 2015-08-18 DIAGNOSIS — N201 Calculus of ureter: Secondary | ICD-10-CM | POA: Diagnosis present

## 2015-08-18 LAB — CBC WITH DIFFERENTIAL/PLATELET
Basophils Absolute: 0 10*3/uL (ref 0.0–0.1)
Basophils Relative: 0 % (ref 0–1)
Eosinophils Absolute: 0 10*3/uL (ref 0.0–0.7)
Eosinophils Relative: 0 % (ref 0–5)
HCT: 39.7 % (ref 39.0–52.0)
Hemoglobin: 13.9 g/dL (ref 13.0–17.0)
Lymphocytes Relative: 25 % (ref 12–46)
Lymphs Abs: 2.5 10*3/uL (ref 0.7–4.0)
MCH: 30.3 pg (ref 26.0–34.0)
MCHC: 35 g/dL (ref 30.0–36.0)
MCV: 86.7 fL (ref 78.0–100.0)
Monocytes Absolute: 0.7 10*3/uL (ref 0.1–1.0)
Monocytes Relative: 7 % (ref 3–12)
Neutro Abs: 6.7 10*3/uL (ref 1.7–7.7)
Neutrophils Relative %: 68 % (ref 43–77)
Platelets: 206 10*3/uL (ref 150–400)
RBC: 4.58 MIL/uL (ref 4.22–5.81)
RDW: 12.2 % (ref 11.5–15.5)
WBC: 10 10*3/uL (ref 4.0–10.5)

## 2015-08-18 LAB — I-STAT CHEM 8, ED
BUN: 18 mg/dL (ref 6–20)
Calcium, Ion: 1.23 mmol/L (ref 1.12–1.23)
Chloride: 103 mmol/L (ref 101–111)
Creatinine, Ser: 1.5 mg/dL — ABNORMAL HIGH (ref 0.61–1.24)
Glucose, Bld: 84 mg/dL (ref 65–99)
HCT: 44 % (ref 39.0–52.0)
Hemoglobin: 15 g/dL (ref 13.0–17.0)
Potassium: 3.5 mmol/L (ref 3.5–5.1)
Sodium: 139 mmol/L (ref 135–145)
TCO2: 21 mmol/L (ref 0–100)

## 2015-08-18 LAB — URINALYSIS, ROUTINE W REFLEX MICROSCOPIC
Bilirubin Urine: NEGATIVE
Glucose, UA: NEGATIVE mg/dL
Ketones, ur: NEGATIVE mg/dL
Leukocytes, UA: NEGATIVE
Nitrite: NEGATIVE
Protein, ur: NEGATIVE mg/dL
Specific Gravity, Urine: 1.017 (ref 1.005–1.030)
Urobilinogen, UA: 0.2 mg/dL (ref 0.0–1.0)
pH: 6 (ref 5.0–8.0)

## 2015-08-18 LAB — URINE MICROSCOPIC-ADD ON

## 2015-08-18 MED ORDER — FENTANYL CITRATE (PF) 100 MCG/2ML IJ SOLN
50.0000 ug | Freq: Once | INTRAMUSCULAR | Status: AC
  Administered 2015-08-18: 50 ug via INTRAVENOUS
  Filled 2015-08-18: qty 2

## 2015-08-18 MED ORDER — ONDANSETRON HCL 4 MG PO TABS: 4.0000 mg | ORAL_TABLET | Freq: Four times a day (QID) | ORAL | Status: DC | PRN

## 2015-08-18 MED ORDER — ONDANSETRON HCL 4 MG/2ML IJ SOLN: 4.0000 mg | Freq: Four times a day (QID) | INTRAMUSCULAR | Status: DC | PRN

## 2015-08-18 MED ORDER — ACETAMINOPHEN 650 MG RE SUPP: 650.0000 mg | Freq: Four times a day (QID) | RECTAL | Status: DC | PRN

## 2015-08-18 MED ORDER — ONDANSETRON HCL 4 MG/2ML IJ SOLN
4.0000 mg | Freq: Once | INTRAMUSCULAR | Status: AC
  Administered 2015-08-18: 4 mg via INTRAVENOUS
  Filled 2015-08-18: qty 2

## 2015-08-18 MED ORDER — HEPARIN SODIUM (PORCINE) 5000 UNIT/ML IJ SOLN
5000.0000 [IU] | Freq: Three times a day (TID) | INTRAMUSCULAR | Status: DC
  Administered 2015-08-19: 5000 [IU] via SUBCUTANEOUS
  Filled 2015-08-18 (×2): qty 1

## 2015-08-18 MED ORDER — SODIUM CHLORIDE 0.9 % IV BOLUS (SEPSIS)
1000.0000 mL | Freq: Once | INTRAVENOUS | Status: AC
  Administered 2015-08-18: 1000 mL via INTRAVENOUS

## 2015-08-18 MED ORDER — TOPIRAMATE 100 MG PO TABS
100.0000 mg | ORAL_TABLET | Freq: Every day | ORAL | Status: DC
  Administered 2015-08-19: 100 mg via ORAL
  Filled 2015-08-18 (×2): qty 1

## 2015-08-18 MED ORDER — KCL IN DEXTROSE-NACL 20-5-0.45 MEQ/L-%-% IV SOLN
INTRAVENOUS | Status: DC
  Administered 2015-08-19: 01:00:00 via INTRAVENOUS
  Filled 2015-08-18 (×3): qty 1000

## 2015-08-18 MED ORDER — ACETAMINOPHEN 325 MG PO TABS: 650.0000 mg | ORAL_TABLET | Freq: Four times a day (QID) | ORAL | Status: DC | PRN

## 2015-08-18 MED ORDER — HYDROMORPHONE HCL 1 MG/ML IJ SOLN
1.0000 mg | INTRAMUSCULAR | Status: DC | PRN
  Administered 2015-08-19 (×3): 1 mg via INTRAVENOUS
  Filled 2015-08-18 (×3): qty 1

## 2015-08-18 NOTE — ED Provider Notes (Signed)
CSN: [id number]     Arrival date & time 08/18/15  1648 History   First MD Initiated Contact with Patient 08/18/15 1806     Chief Complaint  Patient presents with  . Flank Pain     (Consider location/radiation/quality/duration/timing/severity/associated sxs/prior Treatment) HPI  24 year old male with history of kidney stones, status post left lithotripsy on 08/08/15 presenting for evaluation of left flank pain. Patient states after his lithotripsy procedure he was told that THE stone has been evacuated. He did had a stents with a purse string placed which he was able to remove 5 days ago. He has been doing fine up until today when he developed left flank pain. He described pain as achy sensation, nonradiating, 9 out of 10 with associate nausea and vomiting. He has vomited multiple times of nonbloody non-bilious content. Pain is in the same location that he had kidney stones in the past. No complaint of fever, chills, chest pain, shortness of breath, dysuria, hematuria, penile pain or scrotal pain. States that his urine has been clear. He reports family history of kidney stones.   Past Medical History  Diagnosis Date  . Migraine   . Complication of anesthesia   . PONV (postoperative nausea and vomiting)   . Left nephrolithiasis   . Wears glasses   . History of kidney stones    Past Surgical History  Procedure Laterality Date  . Foot capsule release w/ percutaneous heel cord lengthening, tibial tendon transfer Left 1994    clubfoot  . Lymph gland excision  2003    neck--  benign  . Cystoscopy with retrograde pyelogram, ureteroscopy and stent placement Left 06/24/2014    Procedure: CYSTOSCOPY WITH RETROGRADE PYELOGRAM,  AND STENT PLACEMENT;  Surgeon: Magdalene Molly, MD;  Location: [facility];  Service: Urology;  Laterality: Left;  . Cystoscopy with retrograde pyelogram, ureteroscopy and stent placement Left 07/05/2014    Procedure: CYSTO/LEFT URETEROSCOPY/LEFT RETROGRADE PYELOGRAM/LEFT STENT  PLACEMENT;  Surgeon: Jerilee Field, MD;  Location: [facility];  Service: Urology;  Laterality: Left;  . Holmium laser application Left 07/05/2014    Procedure: LASER LITHO;  Surgeon: Jerilee Field, MD;  Location: [facility];  Service: Urology;  Laterality: Left;  . Cystoscopy with retrograde pyelogram, ureteroscopy and stent placement Left 07/06/2015    Procedure: CYSTOSCOPY WITH RETROGRADE PYELOGRAM, URETEROSCOPY , LASER, STENT PLACEMENT and BASKET EXTRACTION;  Surgeon: Jerilee Field, MD;  Location: [facility];  Service: Urology;  Laterality: Left;  . Cystoscopy w/ retrogrades Right 07/06/2015    Procedure: CYSTOSCOPY WITH RETROGRADE PYELOGRAM;  Surgeon: Jerilee Field, MD;  Location: [facility];  Service: Urology;  Laterality: Right;  . Extracorporeal shock wave lithotripsy Left 07-07-2015  &  12-26-2014  . Holmium laser application Left 08/08/2015    Procedure: HOLMIUM LASER  WITH LITHOTRIPSY ;  Surgeon: Jerilee Field, MD;  Location: [facility];  Service: Urology;  Laterality: Left;  . Cystoscopy/retrograde/ureteroscopy/stone extraction with basket Left 08/08/2015    Procedure: CYSTOSCOPY/RETROGRADE/URETEROSCOPY/STONE EXTRACTION WITH BASKET;  Surgeon: Jerilee Field, MD;  Location: [facility];  Service: Urology;  Laterality: Left;  . Cystoscopy w/ ureteral stent placement Left 08/08/2015    Procedure: CYSTOSCOPY WITH STENT REPLACEMENT;  Surgeon: Jerilee Field, MD;  Location: [facility];  Service: Urology;  Laterality: Left;   Family History  Problem Relation Age of Onset  . Cancer Father   . Cancer Other   . Hypertension Other   . Hyperlipidemia Other   . Stroke Other  Social History  Substance Use Topics  . Smoking status: Never Smoker   . Smokeless tobacco: Never Used  . Alcohol Use: 0.0 oz/week    0 Standard drinks or equivalent per week    Review of Systems  All other systems reviewed and are  negative.     Allergies  Codeine and Adhesive  Home Medications   Prior to Admission medications   Medication Sig Start Date End Date Taking? Authorizing Provider  HYDROmorphone (DILAUDID) 4 MG tablet Take 1 tablet (4 mg total) by mouth every 6 (six) hours as needed for severe pain. 08/08/15  Yes Jerilee Field, MD  naproxen sodium (ANAPROX) 220 MG tablet Take 400 mg by mouth every 12 (twelve) hours as needed (pain).   Yes Historical Provider, MD  rizatriptan (MAXALT-MLT) 10 MG disintegrating tablet Take 1 tablet (10 mg total) by mouth as needed for migraine. May repeat in 2 hours if needed; max 2 per 24 hrs 04/15/15  Yes Campbell Riches, NP  topiramate (TOPAMAX) 100 MG tablet One po qhs Patient taking differently: Take 100 mg by mouth at bedtime. One po qhs 05/02/15  Yes Campbell Riches, NP  HYDROmorphone (DILAUDID) 4 MG tablet Take 1 tablet (4 mg total) by mouth every 6 (six) hours as needed for moderate pain or severe pain. Patient not taking: Reported on 08/18/2015 07/07/15   Jerilee Field, MD  nitrofurantoin, macrocrystal-monohydrate, (MACROBID) 100 MG capsule Take 1 capsule (100 mg total) by mouth at bedtime. Patient not taking: Reported on 08/18/2015 08/08/15   Jerilee Field, MD  ondansetron (ZOFRAN) 4 MG tablet Take 1 tablet (4 mg total) by mouth every 8 (eight) hours as needed for nausea or vomiting. Patient not taking: Reported on 08/18/2015 07/07/15   Jerilee Field, MD  phenazopyridine (PYRIDIUM) 200 MG tablet Take 1 tablet (200 mg total) by mouth 2 (two) times daily as needed for pain. Patient not taking: Reported on 08/18/2015 07/07/15   Jerilee Field, MD  phenazopyridine (PYRIDIUM) 200 MG tablet Take 1 tablet (200 mg total) by mouth 3 (three) times daily as needed for pain. Patient not taking: Reported on 08/18/2015 08/08/15   Jerilee Field, MD  tamsulosin (FLOMAX) 0.4 MG CAPS capsule Take 1 capsule (0.4 mg total) by mouth daily after supper. Patient not taking:  Reported on 08/18/2015 07/07/15   Jerilee Field, MD   BP 116/78 mmHg  Pulse 72  Temp(Src) 98.2 F (36.8 C) (Oral)  Resp 18  SpO2 100% Physical Exam  Constitutional: He appears well-developed and well-nourished. No distress.  HENT:  Head: Atraumatic.  Eyes: Conjunctivae are normal.  Neck: Neck supple.  Cardiovascular: Normal rate and regular rhythm.   Pulmonary/Chest: Effort normal and breath sounds normal.  Abdominal: Soft.  Genitourinary:  Mild left CVA tenderness.  Neurological: He is alert.  Skin: No rash noted.  Psychiatric: He has a normal mood and affect.  Nursing note and vitals reviewed.   ED Course  Procedures (including critical care time)  Patient with history of recurrent kidney stones status post left lithotripsy 10 days ago. He is here with left flank pain. He has persistent vomiting and pain before, will give antinausea medication and pain medication via IV. I will check his renal function, and check a UA. His abdomen is benign. His afebrile stable normal vital sign. No hypoxia.  9:33 PM Urine with moderate hemoglobin on urine dipstick but no evidence of urinary tract infection. Renal function with creatinine 1.5 which is mildly elevated from baseline. Normal WBC. Renal  ultrasound showing moderate to marked left hydronephrosis. A week left ureteral jet in the urinary bladder on Doppler evaluation indicates that if there is obstruction it is incomplete. Bilateral renal calculi. Patient has received several doses of pain medication and antinausea medication while he is in the ER. I appreciate consultation from urologist, Dr. Mena Goes who recommend patient to be admitted by medicine for the night and patient will be scheduled for renal stenting tomorrow. Patient and family understand the plan and agrees with plan. Will consult medicine for admission.  10:36 PM Appreciate consultation from Hospitalist Dr. Cena Benton who agrees to see pt in ER and will admit to med surg, under  his care.   Labs Review Labs Reviewed  URINALYSIS, ROUTINE W REFLEX MICROSCOPIC (NOT AT [facility]) - Abnormal; Notable for the following:    APPearance CLOUDY (*)    Hgb urine dipstick MODERATE (*)    All other components within normal limits  I-STAT CHEM 8, ED - Abnormal; Notable for the following:    Creatinine, Ser 1.50 (*)    All other components within normal limits  CBC WITH DIFFERENTIAL/PLATELET  URINE MICROSCOPIC-ADD ON    Imaging Review US Renal  08/18/2015   CLINICAL DATA:  24 year old with current history of urinary tract calculi O underwent stone extraction from the left urinary tract on 08/08/2015. Double-J stent was removed on 08/14/2015. He presents with 1 day history of left flank pain and nausea.  EXAM: RENAL / URINARY TRACT ULTRASOUND COMPLETE  COMPARISON:  Multiple one-view abdomen x-rays, most recently 08/08/2015. CT abdomen and pelvis 06/26/2015.  FINDINGS: Right Kidney:  Length: Approximately 11.7 cm. No hydronephrosis. Well-preserved cortex. Approximate 7 mm shadowing calculus in a lower pole calyx as noted on prior CT. Normal parenchymal echotexture. No focal parenchymal abnormality.  Left Kidney:  Length: Approximately 13.4 cm. Moderate to marked hydronephrosis. Well-preserved cortex. Approximate 10 mm calculus (versus multiple stone fragments) in a lower pole calix. Normal parenchymal echotexture. No focal parenchymal abnormality.  Bladder:  Decompressed and unremarkable. Strong right ureteral jet and weak left ureteral jet identified on color Doppler evaluation.  IMPRESSION: 1. Moderate to marked left hydronephrosis. A weak left ureteral jet in the urinary bladder on Doppler evaluation indicates that if there is obstruction, it is incomplete. 2. Bilateral renal calculi.   Electronically Signed   By: Hulan Saas M.D.   On: 08/18/2015 20:12   I have personally reviewed and evaluated these images and lab results as part of my medical decision-making.   EKG  Interpretation None      MDM   Final diagnoses:  Left ureteral stone    BP 101/58 mmHg  Pulse 69  Temp(Src) 98.2 F (36.8 C) (Oral)  Resp 18  SpO2 100%  I have reviewed nursing notes and vital signs. I personally viewed the imaging tests through PACS system and agrees with radiologist's intepretation I reviewed available ER/hospitalization records through the EMR     Fayrene Helper, PA-C 08/18/15 2237  Tilden Fossa, MD 08/19/15 [phone]

## 2015-08-18 NOTE — ED Notes (Signed)
Patient reports left lithrotripsy on 08/08/15. Today, the patient c/o left flank pain, fever, and vomiting x 4 since 1300.

## 2015-08-18 NOTE — ED Notes (Signed)
Pt comes in today with a c/o left lower flank pain. Pt states that he had a laser lithotrispy on 8.19. Pt began to have pain today, called his PCP, and they recommended to come to the ED to be evaluated.

## 2015-08-18 NOTE — ED Notes (Signed)
Pt ambulated to bathroom to provide specimen

## 2015-08-18 NOTE — H&P (Signed)
[facility] History and Physical  Thomas Howell QMV:[id number] DOB: 06-10-1991 DOA: 08/18/2015  Referring personnel: Fayrene Helper PCP: Lubertha South, MD   Chief Complaint: Left flank pain and nausea  HPI: Thomas Howell is a 24 y.o. male  With recent history of cystoscopy with left ureteroscopy, basket extraction and stent placement with holmium laser with lithotripsy on 08/08/2015. He presents with one-day complaint of left flank pain and nausea. This started today and has persisted. Nothing he is aware of makes it better or worse. Since onset the pain has been persistent. As a result patient presented for further evaluation recommendations. Ultrasound would show left hydronephrosis. Emergency department contacted urologist and plans were to admit patient for symptom management and further evaluation recommendations by urologist next a.m.   Review of Systems:  Constitutional:  No weight loss, night sweats, Fevers, chills, fatigue.  HEENT:  No headaches, Difficulty swallowing,Tooth/dental problems,Sore throat,  No sneezing, itching, ear ache, nasal congestion, post nasal drip,  Cardio-vascular:  No chest pain, Orthopnea, PND, swelling in lower extremities, anasarca, dizziness, palpitations  GI:  No heartburn, indigestion, abdominal pain, + nausea, vomiting, diarrhea, change in bowel habits, loss of appetite  Resp:  No shortness of breath with exertion or at rest. No excess mucus, no productive cough, No non-productive cough, No coughing up of blood.No change in color of mucus.No wheezing.No chest wall deformity  Skin:  no rash or lesions.  GU:  no dysuria, change in color of urine, no urgency or frequency. + flank pain.  Musculoskeletal:  No joint pain or swelling. No decreased range of motion. No back pain.  Psych:  No change in mood or affect. No depression or anxiety. No memory loss.   Past Medical History  Diagnosis Date  . Migraine   . Complication of anesthesia     . PONV (postoperative nausea and vomiting)   . Left nephrolithiasis   . Wears glasses   . History of kidney stones    Past Surgical History  Procedure Laterality Date  . Foot capsule release w/ percutaneous heel cord lengthening, tibial tendon transfer Left 1994    clubfoot  . Lymph gland excision  2003    neck--  benign  . Cystoscopy with retrograde pyelogram, ureteroscopy and stent placement Left 06/24/2014    Procedure: CYSTOSCOPY WITH RETROGRADE PYELOGRAM,  AND STENT PLACEMENT;  Surgeon: Magdalene Molly, MD;  Location: [facility];  Service: Urology;  Laterality: Left;  . Cystoscopy with retrograde pyelogram, ureteroscopy and stent placement Left 07/05/2014    Procedure: CYSTO/LEFT URETEROSCOPY/LEFT RETROGRADE PYELOGRAM/LEFT STENT PLACEMENT;  Surgeon: Jerilee Field, MD;  Location: [facility];  Service: Urology;  Laterality: Left;  . Holmium laser application Left 07/05/2014    Procedure: LASER LITHO;  Surgeon: Jerilee Field, MD;  Location: [facility];  Service: Urology;  Laterality: Left;  . Cystoscopy with retrograde pyelogram, ureteroscopy and stent placement Left 07/06/2015    Procedure: CYSTOSCOPY WITH RETROGRADE PYELOGRAM, URETEROSCOPY , LASER, STENT PLACEMENT and BASKET EXTRACTION;  Surgeon: Jerilee Field, MD;  Location: [facility];  Service: Urology;  Laterality: Left;  . Cystoscopy w/ retrogrades Right 07/06/2015    Procedure: CYSTOSCOPY WITH RETROGRADE PYELOGRAM;  Surgeon: Jerilee Field, MD;  Location: [facility];  Service: Urology;  Laterality: Right;  . Extracorporeal shock wave lithotripsy Left 07-07-2015  &  12-26-2014  . Holmium laser application Left 08/08/2015    Procedure: HOLMIUM LASER  WITH LITHOTRIPSY ;  Surgeon: Jerilee Field, MD;  Location: [facility];  Service: Urology;  Laterality: Left;  . Cystoscopy/retrograde/ureteroscopy/stone extraction with basket Left 08/08/2015    Procedure:  CYSTOSCOPY/RETROGRADE/URETEROSCOPY/STONE EXTRACTION WITH BASKET;  Surgeon: Jerilee Field, MD;  Location: [facility];  Service: Urology;  Laterality: Left;  . Cystoscopy w/ ureteral stent placement Left 08/08/2015    Procedure: CYSTOSCOPY WITH STENT REPLACEMENT;  Surgeon: Jerilee Field, MD;  Location: [facility];  Service: Urology;  Laterality: Left;   Social History:  reports that he has never smoked. He has never used smokeless tobacco. He reports that he drinks alcohol. He reports that he does not use illicit drugs.  Allergies  Allergen Reactions  . Codeine Swelling    Lips swell//  This includes cough syrup w/ codeine  . Adhesive [Tape] Rash    Family History  Problem Relation Age of Onset  . Cancer Father   . Cancer Other   . Hypertension Other   . Hyperlipidemia Other   . Stroke Other      Prior to Admission medications   Medication Sig Start Date End Date Taking? Authorizing Provider  HYDROmorphone (DILAUDID) 4 MG tablet Take 1 tablet (4 mg total) by mouth every 6 (six) hours as needed for severe pain. 08/08/15  Yes Jerilee Field, MD  naproxen sodium (ANAPROX) 220 MG tablet Take 400 mg by mouth every 12 (twelve) hours as needed (pain).   Yes Historical Provider, MD  rizatriptan (MAXALT-MLT) 10 MG disintegrating tablet Take 1 tablet (10 mg total) by mouth as needed for migraine. May repeat in 2 hours if needed; max 2 per 24 hrs 04/15/15  Yes Campbell Riches, NP  topiramate (TOPAMAX) 100 MG tablet One po qhs Patient taking differently: Take 100 mg by mouth at bedtime. One po qhs 05/02/15  Yes Campbell Riches, NP  HYDROmorphone (DILAUDID) 4 MG tablet Take 1 tablet (4 mg total) by mouth every 6 (six) hours as needed for moderate pain or severe pain. Patient not taking: Reported on 08/18/2015 07/07/15   Jerilee Field, MD  nitrofurantoin, macrocrystal-monohydrate, (MACROBID) 100 MG capsule Take 1 capsule (100 mg total) by mouth at  bedtime. Patient not taking: Reported on 08/18/2015 08/08/15   Jerilee Field, MD  ondansetron (ZOFRAN) 4 MG tablet Take 1 tablet (4 mg total) by mouth every 8 (eight) hours as needed for nausea or vomiting. Patient not taking: Reported on 08/18/2015 07/07/15   Jerilee Field, MD  phenazopyridine (PYRIDIUM) 200 MG tablet Take 1 tablet (200 mg total) by mouth 2 (two) times daily as needed for pain. Patient not taking: Reported on 08/18/2015 07/07/15   Jerilee Field, MD  phenazopyridine (PYRIDIUM) 200 MG tablet Take 1 tablet (200 mg total) by mouth 3 (three) times daily as needed for pain. Patient not taking: Reported on 08/18/2015 08/08/15   Jerilee Field, MD  tamsulosin (FLOMAX) 0.4 MG CAPS capsule Take 1 capsule (0.4 mg total) by mouth daily after supper. Patient not taking: Reported on 08/18/2015 07/07/15   Jerilee Field, MD   Physical Exam: Filed Vitals:   08/18/15 1742 08/18/15 2036  BP: 116/78 101/58  Pulse: 72 69  Temp: 98.2 F (36.8 C) 98.2 F (36.8 C)  TempSrc: Oral Oral  Resp: 18 18  SpO2: 100% 100%    Wt Readings from Last 3 Encounters:  08/08/15 83.235 kg (183 lb 8 oz)  07/06/15 83.915 kg (185 lb)  07/04/15 83.915 kg (185 lb)    General:  Appears calm and comfortable Eyes: PERRL, normal lids, irises & conjunctiva ENT: grossly  normal hearing, lips & tongue Neck: no LAD, masses or thyromegaly Cardiovascular: RRR, no m/r/g. No LE edema. Respiratory: CTA bilaterally, no w/r/r. Normal respiratory effort. Abdomen: soft, nt, nd Skin: no rash or induration seen on limited exam Musculoskeletal: grossly normal tone BUE/BLE Psychiatric: grossly normal mood and affect, speech fluent and appropriate Neurologic: grossly non-focal.          Labs on Admission:  Basic Metabolic Panel:  Recent Labs Lab 08/18/15 1842  NA 139  K 3.5  CL 103  GLUCOSE 84  BUN 18  CREATININE 1.50*   Liver Function Tests: No results for input(s): AST, ALT, ALKPHOS, BILITOT, PROT,  ALBUMIN in the last 168 hours. No results for input(s): LIPASE, AMYLASE in the last 168 hours. No results for input(s): AMMONIA in the last 168 hours. CBC:  Recent Labs Lab 08/18/15 1831 08/18/15 1842  WBC 10.0  --   NEUTROABS 6.7  --   HGB 13.9 15.0  HCT 39.7 44.0  MCV 86.7  --   PLT 206  --    Cardiac Enzymes: No results for input(s): CKTOTAL, CKMB, CKMBINDEX, TROPONINI in the last 168 hours.  BNP (last 3 results) No results for input(s): BNP in the last 8760 hours.  ProBNP (last 3 results) No results for input(s): PROBNP in the last 8760 hours.  CBG: No results for input(s): GLUCAP in the last 168 hours.  Radiological Exams on Admission: US Renal  08/18/2015   CLINICAL DATA:  24 year old with current history of urinary tract calculi O underwent stone extraction from the left urinary tract on 08/08/2015. Double-J stent was removed on 08/14/2015. He presents with 1 day history of left flank pain and nausea.  EXAM: RENAL / URINARY TRACT ULTRASOUND COMPLETE  COMPARISON:  Multiple one-view abdomen x-rays, most recently 08/08/2015. CT abdomen and pelvis 06/26/2015.  FINDINGS: Right Kidney:  Length: Approximately 11.7 cm. No hydronephrosis. Well-preserved cortex. Approximate 7 mm shadowing calculus in a lower pole calyx as noted on prior CT. Normal parenchymal echotexture. No focal parenchymal abnormality.  Left Kidney:  Length: Approximately 13.4 cm. Moderate to marked hydronephrosis. Well-preserved cortex. Approximate 10 mm calculus (versus multiple stone fragments) in a lower pole calix. Normal parenchymal echotexture. No focal parenchymal abnormality.  Bladder:  Decompressed and unremarkable. Strong right ureteral jet and weak left ureteral jet identified on color Doppler evaluation.  IMPRESSION: 1. Moderate to marked left hydronephrosis. A weak left ureteral jet in the urinary bladder on Doppler evaluation indicates that if there is obstruction, it is incomplete. 2. Bilateral renal  calculi.   Electronically Signed   By: Hulan Saas M.D.   On: 08/18/2015 20:12     Assessment/Plan Active Problems:   Kidney stone on left side -We'll admit for symptom management given continued discomfort and nausea. - Disposition per urology    Nephrolithiasis - Urologist consulted and will plan on seeing patient next a.m. With possible stent placement. Will place nothing by mouth after midnight   Code Status: Full DVT Prophylaxis: Heparin Family Communication: Discussed with patient and family member at bedside Disposition Plan: MedSurg  Time spent: > 35 minutes  Penny Pia [facility] Pager [phone]

## 2015-08-19 ENCOUNTER — Observation Stay (HOSPITAL_COMMUNITY): Payer: 59 | Admitting: Anesthesiology

## 2015-08-19 ENCOUNTER — Encounter (HOSPITAL_COMMUNITY)
Admission: EM | Disposition: A | Discharge status: Home / Self Care | Payer: Self-pay | Source: Home / Self Care | Attending: Emergency Medicine

## 2015-08-19 ENCOUNTER — Encounter (HOSPITAL_COMMUNITY): Payer: Self-pay

## 2015-08-19 DIAGNOSIS — N1330 Unspecified hydronephrosis: Secondary | ICD-10-CM | POA: Diagnosis not present

## 2015-08-19 DIAGNOSIS — N201 Calculus of ureter: Secondary | ICD-10-CM

## 2015-08-19 HISTORY — PX: CYSTOSCOPY WITH RETROGRADE PYELOGRAM, URETEROSCOPY AND STENT PLACEMENT: SHX5789

## 2015-08-19 LAB — CBC
HCT: 35.8 % — ABNORMAL LOW (ref 39.0–52.0)
Hemoglobin: 12.2 g/dL — ABNORMAL LOW (ref 13.0–17.0)
MCH: 29.8 pg (ref 26.0–34.0)
MCHC: 34.1 g/dL (ref 30.0–36.0)
MCV: 87.3 fL (ref 78.0–100.0)
Platelets: 171 10*3/uL (ref 150–400)
RBC: 4.1 MIL/uL — ABNORMAL LOW (ref 4.22–5.81)
RDW: 12.2 % (ref 11.5–15.5)
WBC: 7 10*3/uL (ref 4.0–10.5)

## 2015-08-19 LAB — BASIC METABOLIC PANEL
Anion gap: 5 (ref 5–15)
BUN: 19 mg/dL (ref 6–20)
CO2: 27 mmol/L (ref 22–32)
Calcium: 8.9 mg/dL (ref 8.9–10.3)
Chloride: 106 mmol/L (ref 101–111)
Creatinine, Ser: 1.49 mg/dL — ABNORMAL HIGH (ref 0.61–1.24)
GFR calc Af Amer: >60 mL/min (ref >60)
GFR calc non Af Amer: >60 mL/min (ref >60)
Glucose, Bld: 99 mg/dL (ref 65–99)
Potassium: 3.7 mmol/L (ref 3.5–5.1)
Sodium: 138 mmol/L (ref 135–145)

## 2015-08-19 LAB — SURGICAL PCR SCREEN
MRSA, PCR: NEGATIVE
Staphylococcus aureus: NEGATIVE

## 2015-08-19 SURGERY — CYSTOURETEROSCOPY, WITH RETROGRADE PYELOGRAM AND STENT INSERTION
Anesthesia: General | Site: Ureter | Laterality: Left

## 2015-08-19 MED ORDER — PROMETHAZINE HCL 12.5 MG PO TABS: 12.5000 mg | ORAL_TABLET | Freq: Four times a day (QID) | ORAL | Status: DC | PRN

## 2015-08-19 MED ORDER — DEXAMETHASONE SODIUM PHOSPHATE 10 MG/ML IJ SOLN
INTRAMUSCULAR | Status: DC | PRN
  Administered 2015-08-19: 10 mg via INTRAVENOUS

## 2015-08-19 MED ORDER — HYDROMORPHONE HCL 1 MG/ML IJ SOLN
INTRAMUSCULAR | Status: AC
  Filled 2015-08-19: qty 1

## 2015-08-19 MED ORDER — CEFAZOLIN SODIUM-DEXTROSE 2-3 GM-% IV SOLR
INTRAVENOUS | Status: AC
  Filled 2015-08-19: qty 50

## 2015-08-19 MED ORDER — MIDAZOLAM HCL 2 MG/2ML IJ SOLN
INTRAMUSCULAR | Status: AC
  Filled 2015-08-19: qty 4

## 2015-08-19 MED ORDER — HYDROMORPHONE HCL 1 MG/ML IJ SOLN
0.5000 mg | Freq: Once | INTRAMUSCULAR | Status: AC
  Administered 2015-08-19: 0.5 mg via INTRAVENOUS
  Filled 2015-08-19: qty 1

## 2015-08-19 MED ORDER — PROPOFOL 10 MG/ML IV BOLUS
INTRAVENOUS | Status: AC
  Filled 2015-08-19: qty 20

## 2015-08-19 MED ORDER — LACTATED RINGERS IV SOLN
INTRAVENOUS | Status: DC
  Administered 2015-08-19: 1000 mL via INTRAVENOUS
  Administered 2015-08-19: 12:00:00 via INTRAVENOUS

## 2015-08-19 MED ORDER — FENTANYL CITRATE (PF) 100 MCG/2ML IJ SOLN
INTRAMUSCULAR | Status: AC
  Filled 2015-08-19: qty 4

## 2015-08-19 MED ORDER — MIDAZOLAM HCL 5 MG/5ML IJ SOLN
INTRAMUSCULAR | Status: DC | PRN
  Administered 2015-08-19: 2 mg via INTRAVENOUS

## 2015-08-19 MED ORDER — FUROSEMIDE 10 MG/ML IJ SOLN
INTRAMUSCULAR | Status: DC | PRN
  Administered 2015-08-19: 10 mg via INTRAMUSCULAR

## 2015-08-19 MED ORDER — FENTANYL CITRATE (PF) 250 MCG/5ML IJ SOLN
INTRAMUSCULAR | Status: DC | PRN
  Administered 2015-08-19 (×2): 25 ug via INTRAVENOUS

## 2015-08-19 MED ORDER — CEFAZOLIN SODIUM-DEXTROSE 2-3 GM-% IV SOLR
2.0000 g | Freq: Three times a day (TID) | INTRAVENOUS | Status: DC
  Administered 2015-08-19 (×2): 2 g via INTRAVENOUS
  Filled 2015-08-19: qty 50

## 2015-08-19 MED ORDER — DEXAMETHASONE SODIUM PHOSPHATE 10 MG/ML IJ SOLN
INTRAMUSCULAR | Status: AC
  Filled 2015-08-19: qty 1

## 2015-08-19 MED ORDER — PROPOFOL 10 MG/ML IV BOLUS
INTRAVENOUS | Status: DC | PRN
  Administered 2015-08-19: 50 mg via INTRAVENOUS
  Administered 2015-08-19: 150 mg via INTRAVENOUS

## 2015-08-19 MED ORDER — PROMETHAZINE HCL 25 MG/ML IJ SOLN: 6.2500 mg | INTRAMUSCULAR | Status: DC | PRN

## 2015-08-19 MED ORDER — ONDANSETRON HCL 4 MG/2ML IJ SOLN
INTRAMUSCULAR | Status: DC | PRN
  Administered 2015-08-19: 4 mg via INTRAVENOUS

## 2015-08-19 MED ORDER — FUROSEMIDE 10 MG/ML IJ SOLN
INTRAMUSCULAR | Status: AC
  Filled 2015-08-19: qty 2

## 2015-08-19 MED ORDER — LIDOCAINE HCL 2 % EX GEL
CUTANEOUS | Status: AC
  Filled 2015-08-19: qty 10

## 2015-08-19 MED ORDER — SODIUM CHLORIDE 0.9 % IR SOLN
Status: DC | PRN
  Administered 2015-08-19: 3000 mL
  Administered 2015-08-19: 1000 mL

## 2015-08-19 MED ORDER — HYDROMORPHONE HCL 4 MG PO TABS: 4.0000 mg | ORAL_TABLET | Freq: Four times a day (QID) | ORAL | Status: DC | PRN

## 2015-08-19 MED ORDER — OXYCODONE HCL 5 MG PO TABS: 5.0000 mg | ORAL_TABLET | Freq: Once | ORAL | Status: DC | PRN

## 2015-08-19 MED ORDER — IOHEXOL 300 MG/ML  SOLN
INTRAMUSCULAR | Status: DC | PRN
  Administered 2015-08-19: 30 mL via URETHRAL

## 2015-08-19 MED ORDER — HYDROMORPHONE HCL 1 MG/ML IJ SOLN
0.2500 mg | INTRAMUSCULAR | Status: DC | PRN
  Administered 2015-08-19 (×2): 0.25 mg via INTRAVENOUS
  Administered 2015-08-19: 0.5 mg via INTRAVENOUS

## 2015-08-19 MED ORDER — OXYCODONE HCL 5 MG/5ML PO SOLN
5.0000 mg | Freq: Once | ORAL | Status: DC | PRN
  Filled 2015-08-19: qty 5

## 2015-08-19 SURGICAL SUPPLY — 29 items
BAG URO CATCHER STRL LF (DRAPE) IMPLANT
BASKET LASER NITINOL 1.9FR (BASKET) IMPLANT
BASKET STNLS GEMINI 4WIRE 3FR (BASKET) IMPLANT
BASKET ZERO TIP NITINOL 2.4FR (BASKET) IMPLANT
BRUSH URET BIOPSY 3F (UROLOGICAL SUPPLIES) IMPLANT
CATH INTERMIT  6FR 70CM (CATHETERS) IMPLANT
CATH URET 5FR 28IN CONE TIP (BALLOONS) ×1
CATH URET 5FR 70CM CONE TIP (BALLOONS) ×2 IMPLANT
CLOTH BEACON ORANGE TIMEOUT ST (SAFETY) ×3 IMPLANT
FIBER LASER FLEXIVA 1000 (UROLOGICAL SUPPLIES) IMPLANT
FIBER LASER FLEXIVA 200 (UROLOGICAL SUPPLIES) IMPLANT
FIBER LASER FLEXIVA 365 (UROLOGICAL SUPPLIES) IMPLANT
FIBER LASER FLEXIVA 550 (UROLOGICAL SUPPLIES) IMPLANT
FIBER LASER TRAC TIP (UROLOGICAL SUPPLIES) IMPLANT
GLOVE BIOGEL M STRL SZ7.5 (GLOVE) ×3 IMPLANT
GLOVE SURG SS PI 7.0 STRL IVOR (GLOVE) ×3 IMPLANT
GOWN STRL REUS W/TWL XL LVL3 (GOWN DISPOSABLE) ×6 IMPLANT
GUIDEWIRE ANG ZIPWIRE 038X150 (WIRE) ×3 IMPLANT
GUIDEWIRE STR DUAL SENSOR (WIRE) ×3 IMPLANT
IV NS IRRIG 3000ML ARTHROMATIC (IV SOLUTION) ×6 IMPLANT
KIT BALLIN UROMAX 15FX10 (LABEL) IMPLANT
KIT BALLN UROMAX 15FX4 (MISCELLANEOUS) IMPLANT
KIT BALLN UROMAX 26 75X4 (MISCELLANEOUS)
PACK CYSTO (CUSTOM PROCEDURE TRAY) ×3 IMPLANT
SET HIGH PRES BAL DIL (LABEL)
SHEATH ACCESS URETERAL 24CM (SHEATH) IMPLANT
SHEATH ACCESS URETERAL 54CM (SHEATH) IMPLANT
STENT CONTOUR 6FRX26X.038 (STENTS) ×3 IMPLANT
SYRINGE IRR TOOMEY STRL 70CC (SYRINGE) IMPLANT

## 2015-08-19 NOTE — Transfer of Care (Signed)
Immediate Anesthesia Transfer of Care Note  Patient: Thomas Howell  Procedure(s) Performed: Procedure(s): CYSTOSCOPY WITH RETROGRADE PYELOGRAM, URETEROSCOPY AND STENT PLACEMENT (Left)  Patient Location: PACU  Anesthesia Type:General  Level of Consciousness: awake and patient cooperative  Airway & Oxygen Therapy: Patient Spontanous Breathing and Patient connected to face mask oxygen  Post-op Assessment: Report given to RN and Post -op Vital signs reviewed and stable  Post vital signs: Reviewed and stable  Last Vitals:  Filed Vitals:   08/19/15 0434  BP: 105/59  Pulse: 56  Temp: 36.7 C  Resp: 18    Complications: No apparent anesthesia complications

## 2015-08-19 NOTE — Anesthesia Postprocedure Evaluation (Signed)
  Anesthesia Post-op Note  Patient: Thomas Howell  Procedure(s) Performed: Procedure(s): CYSTOSCOPY WITH RETROGRADE PYELOGRAM, URETEROSCOPY AND STENT PLACEMENT (Left)  Patient Location: PACU  Anesthesia Type:General  Level of Consciousness: awake  Airway and Oxygen Therapy: Patient Spontanous Breathing  Post-op Pain: mild  Post-op Assessment: Post-op Vital signs reviewed, Patient's Cardiovascular Status Stable, Respiratory Function Stable, Patent Airway, No signs of Nausea or vomiting and Pain level controlled              Post-op Vital Signs: Reviewed and stable  Last Vitals:  Filed Vitals:   08/19/15 1300  BP: 104/56  Pulse: 71  Temp: 36.7 C  Resp: 12    Complications: No apparent anesthesia complications

## 2015-08-19 NOTE — Anesthesia Procedure Notes (Signed)
Procedure Name: LMA Insertion Date/Time: 08/19/2015 10:49 AM Performed by: Delphia Grates Pre-anesthesia Checklist: Patient identified, Emergency Drugs available, Suction available and Patient being monitored Patient Re-evaluated:Patient Re-evaluated prior to inductionOxygen Delivery Method: Circle system utilized Preoxygenation: Pre-oxygenation with 100% oxygen Intubation Type: IV induction LMA: LMA inserted LMA Size: 5.0 Number of attempts: 1 Placement Confirmation: positive ETCO2 Tube secured with: Tape Dental Injury: Teeth and Oropharynx as per pre-operative assessment

## 2015-08-19 NOTE — Consult Note (Signed)
Consult: Left hydronephrosis, left flank pain Requested by: Thomas Howell, Thomas Howell  History of Present Illness: 24 year old white male status post staged treatment of significant left stone burden with Staged left ureteroscopy and shockwave lithotripsy to clear a large proximal left ureteral stone and a large left lower pole stone.  He removed his ureteral stent 5 days ago with a tether. He went for a run yesterday morning and then noted significant left flank pain of sudden onset yesterday afternoon. He presented to the [facility] ED. Ultrasound was obtained which showed mild to moderate  Hydronephrosis.  There is some shadowing stone fragments in the left lower pole.  I do not appreciate dilation of the ureter. Cr was up slightly to 1.5.   He continues to have left flank pain this AM. His urine has been clear and he's passed no stone fragments. He has no dysuria. Again, no gross hematuria. No F/C. Vitals stable.     Past Medical History  Diagnosis Date  . Migraine   . Complication of anesthesia   . PONV (postoperative nausea and vomiting)   . Left nephrolithiasis   . Wears glasses   . History of kidney stones    Past Surgical History  Procedure Laterality Date  . Foot capsule release w/ percutaneous heel cord lengthening, tibial tendon transfer Left 1994    clubfoot  . Lymph gland excision  2003    neck--  benign  . Cystoscopy with retrograde pyelogram, ureteroscopy and stent placement Left 06/24/2014    Procedure: CYSTOSCOPY WITH RETROGRADE PYELOGRAM,  AND STENT PLACEMENT;  Surgeon: Thomas Molly, MD;  Location: [facility];  Service: Urology;  Laterality: Left;  . Cystoscopy with retrograde pyelogram, ureteroscopy and stent placement Left 07/05/2014    Procedure: CYSTO/LEFT URETEROSCOPY/LEFT RETROGRADE PYELOGRAM/LEFT STENT PLACEMENT;  Surgeon: Thomas Field, MD;  Location: [facility];  Service: Urology;  Laterality: Left;  . Holmium laser application Left 07/05/2014    Procedure:  LASER LITHO;  Surgeon: Thomas Field, MD;  Location: [facility];  Service: Urology;  Laterality: Left;  . Cystoscopy with retrograde pyelogram, ureteroscopy and stent placement Left 07/06/2015    Procedure: CYSTOSCOPY WITH RETROGRADE PYELOGRAM, URETEROSCOPY , LASER, STENT PLACEMENT and BASKET EXTRACTION;  Surgeon: Thomas Field, MD;  Location: [facility];  Service: Urology;  Laterality: Left;  . Cystoscopy w/ retrogrades Right 07/06/2015    Procedure: CYSTOSCOPY WITH RETROGRADE PYELOGRAM;  Surgeon: Thomas Field, MD;  Location: [facility];  Service: Urology;  Laterality: Right;  . Extracorporeal shock wave lithotripsy Left 07-07-2015  &  12-26-2014  . Holmium laser application Left 08/08/2015    Procedure: HOLMIUM LASER  WITH LITHOTRIPSY ;  Surgeon: Thomas Field, MD;  Location: [facility];  Service: Urology;  Laterality: Left;  . Cystoscopy/retrograde/ureteroscopy/stone extraction with basket Left 08/08/2015    Procedure: CYSTOSCOPY/RETROGRADE/URETEROSCOPY/STONE EXTRACTION WITH BASKET;  Surgeon: Thomas Field, MD;  Location: [facility];  Service: Urology;  Laterality: Left;  . Cystoscopy w/ ureteral stent placement Left 08/08/2015    Procedure: CYSTOSCOPY WITH STENT REPLACEMENT;  Surgeon: Thomas Field, MD;  Location: [facility];  Service: Urology;  Laterality: Left;    Home Medications:  Prescriptions prior to admission  Medication Sig Dispense Refill Last Dose  . HYDROmorphone (DILAUDID) 4 MG tablet Take 1 tablet (4 mg total) by mouth every 6 (six) hours as needed for severe pain. 30 tablet 0 08/18/2015 at Unknown time  . naproxen sodium (ANAPROX) 220 MG tablet Take 400 mg  by mouth every 12 (twelve) hours as needed (pain).   Past Week at Unknown time  . rizatriptan (MAXALT-MLT) 10 MG disintegrating tablet Take 1 tablet (10 mg total) by mouth as needed for migraine. May repeat in 2 hours if needed; max 2 per 24 hrs 6 tablet 2  PRN  . topiramate (TOPAMAX) 100 MG tablet One po qhs (Patient taking differently: Take 100 mg by mouth at bedtime. One po qhs) 30 tablet 5 08/17/2015 at Unknown time  . HYDROmorphone (DILAUDID) 4 MG tablet Take 1 tablet (4 mg total) by mouth every 6 (six) hours as needed for moderate pain or severe pain. (Patient not taking: Reported on 08/18/2015) 30 tablet 0 Not Taking at Unknown time  . nitrofurantoin, macrocrystal-monohydrate, (MACROBID) 100 MG capsule Take 1 capsule (100 mg total) by mouth at bedtime. (Patient not taking: Reported on 08/18/2015) 7 capsule 0 Not Taking at Unknown time  . ondansetron (ZOFRAN) 4 MG tablet Take 1 tablet (4 mg total) by mouth every 8 (eight) hours as needed for nausea or vomiting. (Patient not taking: Reported on 08/18/2015) 20 tablet 0 Not Taking at Unknown time  . phenazopyridine (PYRIDIUM) 200 MG tablet Take 1 tablet (200 mg total) by mouth 2 (two) times daily as needed for pain. (Patient not taking: Reported on 08/18/2015) 10 tablet 0 Not Taking at Unknown time  . phenazopyridine (PYRIDIUM) 200 MG tablet Take 1 tablet (200 mg total) by mouth 3 (three) times daily as needed for pain. (Patient not taking: Reported on 08/18/2015) 16 tablet 0 Not Taking at Unknown time  . tamsulosin (FLOMAX) 0.4 MG CAPS capsule Take 1 capsule (0.4 mg total) by mouth daily after supper. (Patient not taking: Reported on 08/18/2015) 30 capsule 0 Not Taking at Unknown time   Allergies:  Allergies  Allergen Reactions  . Codeine Swelling    Lips swell//  This includes cough syrup w/ codeine  . Adhesive [Tape] Rash    Family History  Problem Relation Age of Onset  . Cancer Father   . Cancer Other   . Hypertension Other   . Hyperlipidemia Other   . Stroke Other    Social History:  reports that he has never smoked. He has never used smokeless tobacco. He reports that he drinks alcohol. He reports that he does not use illicit drugs.  ROS: A complete review of systems was performed.  All  systems are negative except for pertinent findings as noted. Review of Systems  All other systems reviewed and are negative.    Physical Exam:  Vital signs in last 24 hours: Temp:  [98 F (36.7 C)-98.8 F (37.1 C)] 98 F (36.7 C) (08/30 0434) Pulse Rate:  [56-72] 56 (08/30 0434) Resp:  [18-20] 18 (08/30 0434) BP: (101-117)/(54-78) 105/59 mmHg (08/30 0434) SpO2:  [99 %-100 %] 99 % (08/30 0434) Weight:  [82.2 kg (181 lb 3.5 oz)] 82.2 kg (181 lb 3.5 oz) (08/30 0046) General:  Alert and oriented, No acute distress HEENT: Normocephalic, atraumatic Neck: No JVD or lymphadenopathy Cardiovascular: Regular rate and rhythm Lungs: Regular rate and effort Abdomen: Soft, nontender, nondistended, no abdominal masses Back: No CVA tenderness Extremities: No edema Neurologic: Grossly intact  Laboratory Data:  Results for orders placed or performed during the hospital encounter of 08/18/15 (from the past 24 hour(s))  CBC with Differential/Platelet     Status: None   Collection Time: 08/18/15  6:31 PM  Result Value Ref Range   WBC 10.0 4.0 - 10.5 K/uL   RBC  4.58 4.22 - 5.81 MIL/uL   Hemoglobin 13.9 13.0 - 17.0 g/dL   HCT 21.3 08.6 - 57.8 %   MCV 86.7 78.0 - 100.0 fL   MCH 30.3 26.0 - 34.0 pg   MCHC 35.0 30.0 - 36.0 g/dL   RDW 46.9 62.9 - 52.8 %   Platelets 206 150 - 400 K/uL   Neutrophils Relative % 68 43 - 77 %   Neutro Abs 6.7 1.7 - 7.7 K/uL   Lymphocytes Relative 25 12 - 46 %   Lymphs Abs 2.5 0.7 - 4.0 K/uL   Monocytes Relative 7 3 - 12 %   Monocytes Absolute 0.7 0.1 - 1.0 K/uL   Eosinophils Relative 0 0 - 5 %   Eosinophils Absolute 0.0 0.0 - 0.7 K/uL   Basophils Relative 0 0 - 1 %   Basophils Absolute 0.0 0.0 - 0.1 K/uL  I-stat chem 8, ed     Status: Abnormal   Collection Time: 08/18/15  6:42 PM  Result Value Ref Range   Sodium 139 135 - 145 mmol/L   Potassium 3.5 3.5 - 5.1 mmol/L   Chloride 103 101 - 111 mmol/L   BUN 18 6 - 20 mg/dL   Creatinine, Ser 4.13 (H) 0.61 - 1.24  mg/dL   Glucose, Bld 84 65 - 99 mg/dL   Calcium, Ion 2.44 0.10 - 1.23 mmol/L   TCO2 21 0 - 100 mmol/L   Hemoglobin 15.0 13.0 - 17.0 g/dL   HCT 27.2 53.6 - 64.4 %  Urinalysis, Routine w reflex microscopic-may I&O cath if menses (not at [facility])     Status: Abnormal   Collection Time: 08/18/15  7:00 PM  Result Value Ref Range   Color, Urine YELLOW YELLOW   APPearance CLOUDY (A) CLEAR   Specific Gravity, Urine 1.017 1.005 - 1.030   pH 6.0 5.0 - 8.0   Glucose, UA NEGATIVE NEGATIVE mg/dL   Hgb urine dipstick MODERATE (A) NEGATIVE   Bilirubin Urine NEGATIVE NEGATIVE   Ketones, ur NEGATIVE NEGATIVE mg/dL   Protein, ur NEGATIVE NEGATIVE mg/dL   Urobilinogen, UA 0.2 0.0 - 1.0 mg/dL   Nitrite NEGATIVE NEGATIVE   Leukocytes, UA NEGATIVE NEGATIVE  Urine microscopic-add on     Status: None   Collection Time: 08/18/15  7:00 PM  Result Value Ref Range   WBC, UA 0-2 <3 WBC/hpf   RBC / HPF 3-6 <3 RBC/hpf   Urine-Other MUCOUS PRESENT   CBC     Status: Abnormal   Collection Time: 08/19/15  6:00 AM  Result Value Ref Range   WBC 7.0 4.0 - 10.5 K/uL   RBC 4.10 (L) 4.22 - 5.81 MIL/uL   Hemoglobin 12.2 (L) 13.0 - 17.0 g/dL   HCT 03.4 (L) 74.2 - 59.5 %   MCV 87.3 78.0 - 100.0 fL   MCH 29.8 26.0 - 34.0 pg   MCHC 34.1 30.0 - 36.0 g/dL   RDW 63.8 75.6 - 43.3 %   Platelets 171 150 - 400 K/uL  Basic metabolic panel     Status: Abnormal   Collection Time: 08/19/15  6:00 AM  Result Value Ref Range   Sodium 138 135 - 145 mmol/L   Potassium 3.7 3.5 - 5.1 mmol/L   Chloride 106 101 - 111 mmol/L   CO2 27 22 - 32 mmol/L   Glucose, Bld 99 65 - 99 mg/dL   BUN 19 6 - 20 mg/dL   Creatinine, Ser 2.95 (H) 0.61 - 1.24 mg/dL  Calcium 8.9 8.9 - 10.3 mg/dL   GFR calc non Af Amer >60 >60 mL/min   GFR calc Af Amer >60 >60 mL/min   Anion gap 5 5 - 15   No results found for this or any previous visit (from the past 240 hour(s)). Creatinine:  Recent Labs  08/18/15 1842 08/19/15 0600  CREATININE 1.50* 1.49*    Renal u/s - images reviewed .   Impression/Assessment/plan: Left flank pain, left hydronephrosis - discussed with patient and MOP, I discussed with the patient the nature, potential benefits, risks and alternatives to cysto, left RGP, left ureteral stent possible left URS, HLL, stone basket extraction if any fragments encountered, including side effects of the proposed treatment, the likelihood of the patient achieving the goals of the procedure, and any potential problems that might occur during the procedure or recuperation. We discussed alternatives such as continued observation. All questions answered. Patient elects to proceed with above procedures.     Thomas Howell 08/19/2015

## 2015-08-19 NOTE — Op Note (Signed)
Preoperative diagnosis: Left hydronephrosis Postoperative diagnosis: Left hydronephrosis, bladder stones, ureteral stones  Procedure: Cystoscopy, left retrograde pyelogram, left ureteroscopy, left ureteral stent placement  Surgeon: Mena Goes   Anesthesia: Gen.  Indication for procedure: 24 year old with prior staged treatment of left upper ureter and renal stones who presents with left hydronephrosis. He was brought today for left ureteroscopy, left retrograde pyelogram.  Findings: On inspection in the bladder there were multiple small stone fragments which appeared to recently passed. The urethra and bladder were otherwise unremarkable.  On left retrograde pyelogram-there was a single ureter single collecting system unit. There was some narrowing in the proximal ureter which was present and had caused some difficulty the initial left ureteroscopy in July. I believe this is where the stone had been impacted in the proximal ureter. However there were no obvious filling defects of the ureter. There was mild to moderate left hydronephrosis. Again no filling defects. Also no stones were noted on scout imaging. IV Lasix was given and after 8 minutes there was poor washout of the collecting system although ureteral jet appeared adequate.  On left ureteroscopy there was narrowing in the left proximal ureter with a few small stone fragments above this which simply just washed away. These were approximately millimeter or less in size. There were no clinically significant stone fragments noted. The scope was able to advance easily to the UPJ which was clear.  Description of procedure: After consent was obtained patient brought to the operating room. After adequate anesthesia he is placed in lithotomy position and prepped and draped in the usual sterile fashion. A timeout was performed to confirm the patient and procedure. The left ureteral orifice was cannulated with a candidate catheter left retrograde  injection of contrast was performed. 10 mg of IV Lasix was given and the left ureteral jet observed. The jet was adequate but I didn't classified is brisk. After 8 minutes    I wasn't satisfied with washout from the collecting system and therefore thought ureteroscopy was indicated. the needlepoint ureteroscope was advanced and the narrowed area in the proximal ureter was encountered but the scope easily passed through this area. There were some small 1 mm fragments and dust which washed away. I was able to go on the way up to the UPJ without difficulty and the ureter was patent. No stones were visible on scout therefore I did not feel renoscopy was indicated. I believe his symptoms and hydronephrosis were from passing several stone fragments after he went for a run yesterday.   The wire was backloaded on the cystoscope and a 6 x 26 cm stent was advanced. The wire was removed with a good coil seen in the kidney and a good coil in the bladder. Left ureteroscopy I felt a ureteral stent was indicated. The patient requested no string. He was awakened and taken into the recovery room in stable condition.   Complications: None   Blood loss: Minimal   Specimens: Stone fragments to office lab   Drains: 6 x 26 cm left ureteral stent   Disposition: Patient stable to PACU

## 2015-08-19 NOTE — Anesthesia Preprocedure Evaluation (Signed)
Anesthesia Evaluation  Patient identified by MRN, date of birth, ID band Patient awake    Reviewed: Allergy & Precautions, H&P , NPO status , Patient's Chart, lab work & pertinent test results  Airway Mallampati: II  TM Distance: >3 FB Neck ROM: Full    Dental no notable dental hx. (+) Dental Advisory Given, Teeth Intact   Pulmonary neg pulmonary ROS,  breath sounds clear to auscultation  Pulmonary exam normal       Cardiovascular negative cardio ROS Normal cardiovascular examRhythm:Regular Rate:Normal     Neuro/Psych  Headaches, negative neurological ROS  negative psych ROS   GI/Hepatic negative GI ROS, Neg liver ROS,   Endo/Other  negative endocrine ROS  Renal/GU Renal InsufficiencyRenal diseasenegative Renal ROS  negative genitourinary   Musculoskeletal negative musculoskeletal ROS (+)   Abdominal   Peds negative pediatric ROS (+)  Hematology negative hematology ROS (+)   Anesthesia Other Findings   Reproductive/Obstetrics negative OB ROS                             Anesthesia Physical  Anesthesia Plan  ASA: II  Anesthesia Plan: General   Post-op Pain Management:    Induction: Intravenous  Airway Management Planned: LMA  Additional Equipment:   Intra-op Plan:   Post-operative Plan:   Informed Consent:   Plan Discussed with: Surgeon  Anesthesia Plan Comments:         Anesthesia Quick Evaluation

## 2015-08-19 NOTE — Progress Notes (Signed)
ANTIBIOTIC CONSULT NOTE - INITIAL  Pharmacy Consult for cefazolin Indication: surgical prophylaxis  Allergies  Allergen Reactions  . Codeine Swelling    Lips swell//  This includes cough syrup w/ codeine  . Adhesive [Tape] Rash    Patient Measurements: Height:  (172.7 cm) Weight: 181 lb 3.5 oz (82.2 kg) IBW/kg (Calculated) : 68.4   Vital Signs: Temp: 98 F (36.7 C) (08/30 0434) Temp Source: Oral (08/30 0434) BP: 105/59 mmHg (08/30 0434) Pulse Rate: 56 (08/30 0434) Intake/Output from previous day: 08/29 0701 - 08/30 0700 In: 506.7 [I.V.:506.7] Out: -  Intake/Output from this shift:    Labs:  Recent Labs  08/18/15 1831 08/18/15 1842 08/19/15 0600  WBC 10.0  --  7.0  HGB 13.9 15.0 12.2*  PLT 206  --  171  CREATININE  --  1.50* 1.49*   Estimated Creatinine Clearance: 80.6 mL/min (by C-G formula based on Cr of 1.49). No results for input(s): VANCOTROUGH, VANCOPEAK, VANCORANDOM, GENTTROUGH, GENTPEAK, GENTRANDOM, TOBRATROUGH, TOBRAPEAK, TOBRARND, AMIKACINPEAK, AMIKACINTROU, AMIKACIN in the last 72 hours.   Microbiology: No results found for this or any previous visit (from the past 720 hour(s)).  Medical History: Past Medical History  Diagnosis Date  . Migraine   . Complication of anesthesia   . PONV (postoperative nausea and vomiting)   . Left nephrolithiasis   . Wears glasses   . History of kidney stones      Assessment: 24 year old white male status post staged treatment of significant left stone burden with Staged left ureteroscopy and shockwave lithotripsy to clear a large proximal left ureteral stone and a large left lower pole stone. He removed his ureteral stent 5 days ago with a tether. He went for a run yesterday morning and then noted significant left flank pain of sudden onset yesterday afternoon. He presented to the [facility] ED. Ultrasound was obtained which showed mild to moderate Hydronephrosis. There is some shadowing stone fragments in the  left lower pole.   Goal of Therapy:  Cefazolin per renal function  Plan:  Cefazolin 2gm IV q8h F/u renal function  Arley Phenix RPh 08/19/2015, 9:18 AM Pager [phone]

## 2015-08-19 NOTE — Discharge Summary (Signed)
Physician Discharge Summary   Patient ID: Thomas Howell MRN: [id number] DOB/AGE: [birth year] 24 y.o.  Admit date: 08/18/2015 Discharge date: 08/19/2015  Primary Care Physician:  Lubertha South, MD  Discharge Diagnoses:    . Left ureteral stone . Hydronephrosis, left  Consults: Urology   Recommendations for Outpatient Follow-up:  Follow up in urology in 1 week    DIET: Regular diet    Allergies:   Allergies  Allergen Reactions  . Codeine Swelling    Lips swell//  This includes cough syrup w/ codeine  . Adhesive [Tape] Rash     Discharge Medications:   Medication List    STOP taking these medications        nitrofurantoin (macrocrystal-monohydrate) 100 MG capsule  Commonly known as:  MACROBID     ondansetron 4 MG tablet  Commonly known as:  ZOFRAN     phenazopyridine 200 MG tablet  Commonly known as:  PYRIDIUM      TAKE these medications        HYDROmorphone 4 MG tablet  Commonly known as:  DILAUDID  Take 1 tablet (4 mg total) by mouth every 6 (six) hours as needed for severe pain.     naproxen sodium 220 MG tablet  Commonly known as:  ANAPROX  Take 400 mg by mouth every 12 (twelve) hours as needed (pain).     promethazine 12.5 MG tablet  Commonly known as:  PHENERGAN  Take 1 tablet (12.5 mg total) by mouth every 6 (six) hours as needed for nausea or vomiting.     rizatriptan 10 MG disintegrating tablet  Commonly known as:  MAXALT-MLT  Take 1 tablet (10 mg total) by mouth as needed for migraine. May repeat in 2 hours if needed; max 2 per 24 hrs     tamsulosin 0.4 MG Caps capsule  Commonly known as:  FLOMAX  Take 1 capsule (0.4 mg total) by mouth daily after supper.     topiramate 100 MG tablet  Commonly known as:  TOPAMAX  One po qhs         Brief H and P: For complete details please refer to admission H and P, but in brief Thomas Howell is a 24 y.o. male  With recent history of cystoscopy with left ureteroscopy, basket extraction  and stent placement with holmium laser with lithotripsy on 08/08/2015. He presented with one-day complaint of left flank pain and nausea that started on the day of admission and persisted. Patient presented to ED. Ultrasound showed hydronephrosis and urology was consulted. Patient was admitted for further management.   Hospital Course:  Left ureteral stone, Left flank pain  - Renal ultrasound showed left-sided hydronephrosis. Patient has a prior stage to treatment of the left upper ureter and renal stones. Urology was consulted and recommended cystoscopy with left retrograde pyelogram, left the uterus copy with ureteral stent placement. Patient underwent the left ureteral stent placement on 8/30 today.  Continue antiemetics and pain control as needed and follow-up with urology out-patient.   Hydronephrosis, left - Per urology possibly his symptoms and hydronephrosis were from passing several stone fragments after he went for a run on the day of admission (training for marathon)  Day of Discharge BP 104/56 mmHg  Pulse 71  Temp(Src) 98 F (36.7 C) (Oral)  Resp 12  Ht  (1.727 m)  Wt 82.2 kg (181 lb 3.5 oz)  BMI 27.56 kg/m2  SpO2 99%  Physical Exam: General: Alert and awake oriented x3 not in  any acute distress. HEENT: anicteric sclera, pupils reactive to light and accommodation CVS: S1-S2 clear no murmur rubs or gallops Chest: clear to auscultation bilaterally, no wheezing rales or rhonchi Abdomen: soft nontender, nondistended, normal bowel sounds Extremities: no cyanosis, clubbing or edema noted bilaterally Neuro: Cranial nerves II-XII intact, no focal neurological deficits   The results of significant diagnostics from this hospitalization (including imaging, microbiology, ancillary and laboratory) are listed below for reference.    LAB RESULTS: Basic Metabolic Panel:  Recent Labs Lab 08/18/15 1842 08/19/15 0600  NA 139 138  K 3.5 3.7  CL 103 106  CO2  --  27   GLUCOSE 84 99  BUN 18 19  CREATININE 1.50* 1.49*  CALCIUM  --  8.9   Liver Function Tests: No results for input(s): AST, ALT, ALKPHOS, BILITOT, PROT, ALBUMIN in the last 168 hours. No results for input(s): LIPASE, AMYLASE in the last 168 hours. No results for input(s): AMMONIA in the last 168 hours. CBC:  Recent Labs Lab 08/18/15 1831 08/18/15 1842 08/19/15 0600  WBC 10.0  --  7.0  NEUTROABS 6.7  --   --   HGB 13.9 15.0 12.2*  HCT 39.7 44.0 35.8*  MCV 86.7  --  87.3  PLT 206  --  171   Cardiac Enzymes: No results for input(s): CKTOTAL, CKMB, CKMBINDEX, TROPONINI in the last 168 hours. BNP: Invalid input(s): POCBNP CBG: No results for input(s): GLUCAP in the last 168 hours.  Significant Diagnostic Studies:  US Renal  08/18/2015   CLINICAL DATA:  24 year old with current history of urinary tract calculi O underwent stone extraction from the left urinary tract on 08/08/2015. Double-J stent was removed on 08/14/2015. He presents with 1 day history of left flank pain and nausea.  EXAM: RENAL / URINARY TRACT ULTRASOUND COMPLETE  COMPARISON:  Multiple one-view abdomen x-rays, most recently 08/08/2015. CT abdomen and pelvis 06/26/2015.  FINDINGS: Right Kidney:  Length: Approximately 11.7 cm. No hydronephrosis. Well-preserved cortex. Approximate 7 mm shadowing calculus in a lower pole calyx as noted on prior CT. Normal parenchymal echotexture. No focal parenchymal abnormality.  Left Kidney:  Length: Approximately 13.4 cm. Moderate to marked hydronephrosis. Well-preserved cortex. Approximate 10 mm calculus (versus multiple stone fragments) in a lower pole calix. Normal parenchymal echotexture. No focal parenchymal abnormality.  Bladder:  Decompressed and unremarkable. Strong right ureteral jet and weak left ureteral jet identified on color Doppler evaluation.  IMPRESSION: 1. Moderate to marked left hydronephrosis. A weak left ureteral jet in the urinary bladder on Doppler evaluation indicates  that if there is obstruction, it is incomplete. 2. Bilateral renal calculi.   Electronically Signed   By: Hulan Saas M.D.   On: 08/18/2015 20:12    2D ECHO:   Disposition and Follow-up:    DISPOSITION: Home   DISCHARGE FOLLOW-UP Follow-up Information    Follow up with ESKRIDGE, MATTHEW, MD. Schedule an appointment as soon as possible for a visit in 10 days.   Specialty:  Urology   Why:  for hospital follow-up   Contact information:   [address] [place] [address] [phone]       Follow up with Lubertha South, MD. Schedule an appointment as soon as possible for a visit in 2 weeks.   Specialty:  Family Medicine   Why:  for hospital follow-up, As needed   Contact information:   [address] [place] [address] [phone]        Time spent on Discharge:  25 minutes  Signed:   Lavonne Cass M.D. [facility] 08/19/2015, 8:47 PM Pager: [phone]

## 2015-08-19 NOTE — Progress Notes (Signed)
[facility]                                                                              Patient Demographics  Thomas Howell, is a 24 y.o. male, DOB - [birth year], ZOX:[id number]  Admit date - 08/18/2015   Admitting Physician Penny Pia, MD  Outpatient Primary MD for the patient is Lubertha South, MD  LOS -    Chief Complaint  Patient presents with  . Flank Pain       Brief HPI   Thomas Howell is a 24 y.o. male  With recent history of cystoscopy with left ureteroscopy, basket extraction and stent placement with holmium laser with lithotripsy on 08/08/2015. He presented with one-day complaint of left flank pain and nausea that started on the day of admission and persisted. Patient presented to ED. Ultrasound showed hydronephrosis and urology was consulted. Patient was admitted for further management.   Assessment & Plan    Principal Problem:   Left ureteral stone,  Left flank pain  - Renal ultrasound showed left-sided hydronephrosis. Patient has a prior stage to treatment of the left upper ureter and renal stones. Urology was consulted and recommended cystoscopy with left retrograde pyelogram, left the uterus copy with ureteral stent placement.  Patient underwent the left ureteral stent placement on 8/30 today.  - Per urology possible discharge today evening if patient is feeling stable otherwise in a.m.  - Continue antiemetics and pain control   Active Problems:   Hydronephrosis, left - Per urology possibly his symptoms and hydronephrosis were from passing several stone fragments after he went for a run on the day of admission (training for marathon)   Code Status:Full code  Family Communication: Discussed in detail with the patient, all imaging results, lab results explained to the patient and mother  Disposition Plan: Possibly today if patient is feeling stable after surgery   Time Spent in minutes  20 minutes  Procedures  Cystoscopy, left  retrograde pyelogram, left ureteroscopy, left ureteral stent placement  Consults   Urology  DVT Prophylaxis  heparin subcutaneous  Medications  Scheduled Meds: . [MAR Hold]  ceFAZolin (ANCEF) IV  2 g Intravenous 3 times per day  . furosemide      . [MAR Hold] heparin  5,000 Units Subcutaneous 3 times per day  . HYDROmorphone      . [MAR Hold] topiramate  100 mg Oral QHS   Continuous Infusions: . dextrose 5 % and 0.45 % NaCl with KCl 20 mEq/L Stopped (08/19/15 1029)  . lactated ringers 125 mL/hr at 08/19/15 1217   PRN Meds:.[MAR Hold] acetaminophen **OR** [MAR Hold] acetaminophen, HYDROmorphone (DILAUDID) injection, [MAR Hold]  HYDROmorphone (DILAUDID) injection, [MAR Hold] ondansetron **OR** [MAR Hold] ondansetron (ZOFRAN) IV, oxyCODONE **OR** oxyCODONE, promethazine   Antibiotics   Anti-infectives    Start     Dose/Rate Route Frequency Ordered Stop   08/19/15 0930  [MAR Hold]  ceFAZolin (ANCEF) IVPB 2 g/50 mL premix     (MAR Hold since 08/19/15 1009)   2 g 100 mL/hr over 30 Minutes Intravenous 3 times per day 08/19/15 0915  Subjective:   Thomas Howell was seen and examined today. Having slight headache and left-sided flank pain otherwise no nausea, vomiting, fevers or chills.  Patient denies dizziness, chest pain, shortness of breath,  new weakness, numbess, tingling. No acute events overnight.    Objective:   Blood pressure 110/55, pulse 60, temperature 97.7 F (36.5 C), temperature source Oral, resp. rate 10, height  (1.727 m), weight 82.2 kg (181 lb 3.5 oz), SpO2 100 %.  Wt Readings from Last 3 Encounters:  08/19/15 82.2 kg (181 lb 3.5 oz)  08/08/15 83.235 kg (183 lb 8 oz)  07/06/15 83.915 kg (185 lb)     Intake/Output Summary (Last 24 hours) at 08/19/15 1228 Last data filed at 08/19/15 1139  Gross per 24 hour  Intake 1406.66 ml  Output      0 ml  Net 1406.66 ml    Exam  General: Alert and oriented x 3, NAD  HEENT:  PERRLA, EOMI,  Anicteric Sclera, mucous membranes moist.   Neck: Supple, no JVD, no masses  CVS: S1 S2 auscultated, no rubs, murmurs or gallops. Regular rate and rhythm.  Respiratory: Clear to auscultation bilaterally, no wheezing, rales or rhonchi  Abdomen: Soft, nontender, nondistended, + bowel sounds  Ext: no cyanosis clubbing or edema  Neuro: AAOx3, Cr N's II- XII. Strength 5/5 upper and lower extremities bilaterally  Skin: No rashes  Psych: Normal affect and demeanor, alert and oriented x3    Data Review   Micro Results No results found for this or any previous visit (from the past 240 hour(s)).  Radiology Reports US Renal  08/18/2015   CLINICAL DATA:  24 year old with current history of urinary tract calculi O underwent stone extraction from the left urinary tract on 08/08/2015. Double-J stent was removed on 08/14/2015. He presents with 1 day history of left flank pain and nausea.  EXAM: RENAL / URINARY TRACT ULTRASOUND COMPLETE  COMPARISON:  Multiple one-view abdomen x-rays, most recently 08/08/2015. CT abdomen and pelvis 06/26/2015.  FINDINGS: Right Kidney:  Length: Approximately 11.7 cm. No hydronephrosis. Well-preserved cortex. Approximate 7 mm shadowing calculus in a lower pole calyx as noted on prior CT. Normal parenchymal echotexture. No focal parenchymal abnormality.  Left Kidney:  Length: Approximately 13.4 cm. Moderate to marked hydronephrosis. Well-preserved cortex. Approximate 10 mm calculus (versus multiple stone fragments) in a lower pole calix. Normal parenchymal echotexture. No focal parenchymal abnormality.  Bladder:  Decompressed and unremarkable. Strong right ureteral jet and weak left ureteral jet identified on color Doppler evaluation.  IMPRESSION: 1. Moderate to marked left hydronephrosis. A weak left ureteral jet in the urinary bladder on Doppler evaluation indicates that if there is obstruction, it is incomplete. 2. Bilateral renal calculi.   Electronically Signed   By:  Hulan Saas M.D.   On: 08/18/2015 20:12    CBC  Recent Labs Lab 08/18/15 1831 08/18/15 1842 08/19/15 0600  WBC 10.0  --  7.0  HGB 13.9 15.0 12.2*  HCT 39.7 44.0 35.8*  PLT 206  --  171  MCV 86.7  --  87.3  MCH 30.3  --  29.8  MCHC 35.0  --  34.1  RDW 12.2  --  12.2  LYMPHSABS 2.5  --   --   MONOABS 0.7  --   --   EOSABS 0.0  --   --   BASOSABS 0.0  --   --     Chemistries   Recent Labs Lab 08/18/15 1842 08/19/15 0600  NA 139 138  K 3.5 3.7  CL 103 106  CO2  --  27  GLUCOSE 84 99  BUN 18 19  CREATININE 1.50* 1.49*  CALCIUM  --  8.9   ------------------------------------------------------------------------------------------------------------------ estimated creatinine clearance is 80.6 mL/min (by C-G formula based on Cr of 1.49). ------------------------------------------------------------------------------------------------------------------ No results for input(s): HGBA1C in the last 72 hours. ------------------------------------------------------------------------------------------------------------------ No results for input(s): CHOL, HDL, LDLCALC, TRIG, CHOLHDL, LDLDIRECT in the last 72 hours. ------------------------------------------------------------------------------------------------------------------ No results for input(s): TSH, T4TOTAL, T3FREE, THYROIDAB in the last 72 hours.  Invalid input(s): FREET3 ------------------------------------------------------------------------------------------------------------------ No results for input(s): VITAMINB12, FOLATE, FERRITIN, TIBC, IRON, RETICCTPCT in the last 72 hours.  Coagulation profile No results for input(s): INR, PROTIME in the last 168 hours.  No results for input(s): DDIMER in the last 72 hours.  Cardiac Enzymes No results for input(s): CKMB, TROPONINI, MYOGLOBIN in the last 168 hours.  Invalid input(s):  CK ------------------------------------------------------------------------------------------------------------------ Invalid input(s): POCBNP  No results for input(s): GLUCAP in the last 72 hours.   Thomas Howell M.D. [facility] 08/19/2015, 12:28 PM  Pager: [phone] Between 7am to 7pm - call Pager - [phone]  After 7pm go to [email or web address] - password [email or web address]  Call night coverage person covering after 7pm

## 2015-08-20 ENCOUNTER — Encounter (HOSPITAL_COMMUNITY): Payer: Self-pay | Admitting: Urology

## 2015-10-20 ENCOUNTER — Other Ambulatory Visit: Payer: Self-pay | Admitting: Nurse Practitioner

## 2015-12-03 ENCOUNTER — Encounter: Payer: 59 | Admitting: Nurse Practitioner

## 2015-12-17 ENCOUNTER — Emergency Department (HOSPITAL_COMMUNITY)
Admission: EM | Admit: 2015-12-17 | Discharge: 2015-12-18 | Disposition: A | Discharge status: Home / Self Care | Payer: 59 | Attending: Emergency Medicine | Admitting: Emergency Medicine

## 2015-12-17 ENCOUNTER — Encounter (HOSPITAL_COMMUNITY): Payer: Self-pay | Admitting: Emergency Medicine

## 2015-12-17 DIAGNOSIS — N23 Unspecified renal colic: Secondary | ICD-10-CM | POA: Diagnosis not present

## 2015-12-17 DIAGNOSIS — R109 Unspecified abdominal pain: Secondary | ICD-10-CM | POA: Diagnosis present

## 2015-12-17 DIAGNOSIS — Z973 Presence of spectacles and contact lenses: Secondary | ICD-10-CM | POA: Insufficient documentation

## 2015-12-17 DIAGNOSIS — Z79899 Other long term (current) drug therapy: Secondary | ICD-10-CM | POA: Diagnosis not present

## 2015-12-17 DIAGNOSIS — G43909 Migraine, unspecified, not intractable, without status migrainosus: Secondary | ICD-10-CM | POA: Diagnosis not present

## 2015-12-17 LAB — URINALYSIS, ROUTINE W REFLEX MICROSCOPIC
Glucose, UA: NEGATIVE mg/dL
Ketones, ur: 40 mg/dL — AB
Nitrite: POSITIVE — AB
Protein, ur: 100 mg/dL — AB
Specific Gravity, Urine: 1.016 (ref 1.005–1.030)
pH: 7 (ref 5.0–8.0)

## 2015-12-17 LAB — URINE MICROSCOPIC-ADD ON: Squamous Epithelial / LPF: NONE SEEN

## 2015-12-17 NOTE — ED Notes (Signed)
Pt is c/o bilateral flank pain  Pt states the pain in his left started about a week ago and the right side started hurting today  Pt states he has had hematuria for about a week  Pt has hx of kidney stones  Pt has nausea without vomiting

## 2015-12-18 ENCOUNTER — Emergency Department (HOSPITAL_COMMUNITY): Payer: 59

## 2015-12-18 MED ORDER — PROMETHAZINE HCL 25 MG PO TABS: 25.0000 mg | ORAL_TABLET | Freq: Four times a day (QID) | ORAL | Status: DC | PRN

## 2015-12-18 MED ORDER — HYDROCODONE-ACETAMINOPHEN 5-325 MG PO TABS: ORAL_TABLET | ORAL | Status: DC

## 2015-12-18 MED ORDER — ONDANSETRON HCL 4 MG/2ML IJ SOLN
4.0000 mg | Freq: Once | INTRAMUSCULAR | Status: AC
  Administered 2015-12-18: 4 mg via INTRAVENOUS
  Filled 2015-12-18: qty 2

## 2015-12-18 MED ORDER — HYDROMORPHONE HCL 1 MG/ML IJ SOLN
0.5000 mg | Freq: Once | INTRAMUSCULAR | Status: AC
  Administered 2015-12-18: 0.5 mg via INTRAVENOUS
  Filled 2015-12-18: qty 1

## 2015-12-18 MED ORDER — HYDROMORPHONE HCL 1 MG/ML IJ SOLN
0.5000 mg | Freq: Once | INTRAMUSCULAR | Status: AC
Start: 2015-12-18 — End: 2015-12-18
  Administered 2015-12-18: 0.5 mg via INTRAVENOUS
  Filled 2015-12-18: qty 1

## 2015-12-18 NOTE — ED Provider Notes (Signed)
CSN: [id number]     Arrival date & time 12/17/15  1953 History   First MD Initiated Contact with Patient 12/17/15 2339     Chief Complaint  Patient presents with  . Flank Pain     (Consider location/radiation/quality/duration/timing/severity/associated sxs/prior Treatment) HPI   Blood pressure 129/91, pulse 96, temperature 97.8 F (36.6 C), temperature source Oral, resp. rate 16, SpO2 99 %.  Thomas Howell is a 24 y.o. male complaining of left flank pain starting about a week ago, right flank pain onset today radiating around to the right groin he's also had hematuria for about a week with mild nausea no vomiting. Patient has a history of multiple kidney stones, has required lithotripsy and stenting in the past. He denies fevers, chills, dysuria, urethral discharge. Patient has been taking Flomax and Phenergan at home with little relief. Rates his pain at 8 out of 10, colicky, no alleviating factors identified.  Past Medical History  Diagnosis Date  . Migraine   . Complication of anesthesia   . PONV (postoperative nausea and vomiting)   . Left nephrolithiasis   . Wears glasses   . History of kidney stones    Past Surgical History  Procedure Laterality Date  . Foot capsule release w/ percutaneous heel cord lengthening, tibial tendon transfer Left 1994    clubfoot  . Lymph gland excision  2003    neck--  benign  . Cystoscopy with retrograde pyelogram, ureteroscopy and stent placement Left 06/24/2014    Procedure: CYSTOSCOPY WITH RETROGRADE PYELOGRAM,  AND STENT PLACEMENT;  Surgeon: Magdalene Molly, MD;  Location: [facility];  Service: Urology;  Laterality: Left;  . Cystoscopy with retrograde pyelogram, ureteroscopy and stent placement Left 07/05/2014    Procedure: CYSTO/LEFT URETEROSCOPY/LEFT RETROGRADE PYELOGRAM/LEFT STENT PLACEMENT;  Surgeon: Jerilee Field, MD;  Location: [facility];  Service: Urology;  Laterality: Left;  . Holmium laser application Left  07/05/2014    Procedure: LASER LITHO;  Surgeon: Jerilee Field, MD;  Location: [facility];  Service: Urology;  Laterality: Left;  . Cystoscopy with retrograde pyelogram, ureteroscopy and stent placement Left 07/06/2015    Procedure: CYSTOSCOPY WITH RETROGRADE PYELOGRAM, URETEROSCOPY , LASER, STENT PLACEMENT and BASKET EXTRACTION;  Surgeon: Jerilee Field, MD;  Location: [facility];  Service: Urology;  Laterality: Left;  . Cystoscopy w/ retrogrades Right 07/06/2015    Procedure: CYSTOSCOPY WITH RETROGRADE PYELOGRAM;  Surgeon: Jerilee Field, MD;  Location: [facility];  Service: Urology;  Laterality: Right;  . Extracorporeal shock wave lithotripsy Left 07-07-2015  &  12-26-2014  . Holmium laser application Left 08/08/2015    Procedure: HOLMIUM LASER  WITH LITHOTRIPSY ;  Surgeon: Jerilee Field, MD;  Location: [facility];  Service: Urology;  Laterality: Left;  . Cystoscopy/retrograde/ureteroscopy/stone extraction with basket Left 08/08/2015    Procedure: CYSTOSCOPY/RETROGRADE/URETEROSCOPY/STONE EXTRACTION WITH BASKET;  Surgeon: Jerilee Field, MD;  Location: [facility];  Service: Urology;  Laterality: Left;  . Cystoscopy w/ ureteral stent placement Left 08/08/2015    Procedure: CYSTOSCOPY WITH STENT REPLACEMENT;  Surgeon: Jerilee Field, MD;  Location: [facility];  Service: Urology;  Laterality: Left;  . Cystoscopy with retrograde pyelogram, ureteroscopy and stent placement Left 08/19/2015    Procedure: CYSTOSCOPY WITH RETROGRADE PYELOGRAM, URETEROSCOPY AND STENT PLACEMENT;  Surgeon: Jerilee Field, MD;  Location: [facility];  Service: Urology;  Laterality: Left;   Family History  Problem Relation Age of Onset  . Cancer Father   . Cancer Other   . Hypertension  Other   . Hyperlipidemia Other   . Stroke Other   . Kidney Stones Mother   . Kidney Stones Brother    Social History  Substance Use Topics  . Smoking status: Never Smoker   .  Smokeless tobacco: Never Used  . Alcohol Use: 0.0 oz/week    0 Standard drinks or equivalent per week     Comment: occ    Review of Systems  10 systems reviewed and found to be negative, except as noted in the HPI.   Allergies  Codeine and Adhesive  Home Medications   Prior to Admission medications   Medication Sig Start Date End Date Taking? Authorizing Provider  aspirin-acetaminophen-caffeine (EXCEDRIN MIGRAINE) [phone] MG tablet Take 1 tablet by mouth every 6 (six) hours as needed for headache or migraine.   Yes Historical Provider, MD  rizatriptan (MAXALT-MLT) 10 MG disintegrating tablet Take 1 tablet (10 mg total) by mouth as needed for migraine. May repeat in 2 hours if needed; max 2 per 24 hrs 04/15/15  Yes Campbell Riches, NP  topiramate (TOPAMAX) 100 MG tablet TAKE 1 TABLET AT BEDTIME 10/21/15  Yes Campbell Riches, NP  HYDROcodone-acetaminophen (NORCO/VICODIN) 5-325 MG tablet Take 1-2 tablets by mouth every 6 hours as needed for pain and/or cough. 12/18/15   Emelina Hinch, PA-C  HYDROmorphone (DILAUDID) 4 MG tablet Take 1 tablet (4 mg total) by mouth every 6 (six) hours as needed for severe pain. Patient not taking: Reported on 12/17/2015 08/19/15   Ripudeep Jenna Luo, MD  naproxen sodium (ANAPROX) 220 MG tablet Take 400 mg by mouth every 12 (twelve) hours as needed (pain).    Historical Provider, MD  promethazine (PHENERGAN) 25 MG tablet Take 1 tablet (25 mg total) by mouth every 6 (six) hours as needed for nausea or vomiting. 12/18/15   Joni Reining Briarrose Shor, PA-C  tamsulosin (FLOMAX) 0.4 MG CAPS capsule Take 1 capsule (0.4 mg total) by mouth daily after supper. Patient not taking: Reported on 08/18/2015 07/07/15   Jerilee Field, MD   BP 120/78 mmHg  Pulse 59  Temp(Src) 97.8 F (36.6 C) (Oral)  Resp 13  SpO2 100% Physical Exam  Constitutional: He is oriented to person, place, and time. He appears well-developed and well-nourished. No distress.  HENT:  Head:  Normocephalic and atraumatic.  Mouth/Throat: Oropharynx is clear and moist.  Eyes: Conjunctivae and EOM are normal. Pupils are equal, round, and reactive to light.  Neck: Normal range of motion.  Cardiovascular: Normal rate, regular rhythm and intact distal pulses.   Pulmonary/Chest: Effort normal and breath sounds normal. No stridor.  Abdominal: Soft. He exhibits no distension and no mass. There is no tenderness. There is no rebound and no guarding.  Genitourinary:  No CVA tenderness to percussion bilaterally  Musculoskeletal: Normal range of motion.  Neurological: He is alert and oriented to person, place, and time.  Skin: He is not diaphoretic.  Psychiatric: He has a normal mood and affect.  Nursing note and vitals reviewed.   ED Course  Procedures (including critical care time) Labs Review Labs Reviewed  URINALYSIS, ROUTINE W REFLEX MICROSCOPIC (NOT AT [facility]) - Abnormal; Notable for the following:    Color, Urine RED (*)    APPearance TURBID (*)    Hgb urine dipstick LARGE (*)    Bilirubin Urine LARGE (*)    Ketones, ur 40 (*)    Protein, ur 100 (*)    Nitrite POSITIVE (*)    Leukocytes, UA MODERATE (*)  All other components within normal limits  URINE MICROSCOPIC-ADD ON - Abnormal; Notable for the following:    Bacteria, UA FEW (*)    All other components within normal limits  URINE CULTURE    Imaging Review [facility]  12/18/2015  CLINICAL DATA:  Acute onset of left flank pain.  Initial encounter. EXAM: RENAL / URINARY TRACT ULTRASOUND COMPLETE COMPARISON:  CT of the abdomen and pelvis performed 06/26/2015, and renal ultrasound performed 08/18/2015 FINDINGS: Right Kidney: Length: 12.5 cm. Echogenicity within normal limits. A 4 mm stone is noted at the interpole region of the right kidney. No mass or hydronephrosis visualized. Left Kidney: Length: 12.0 cm. Echogenicity within normal limits. Two stones are seen at the lower pole of the left kidney, measuring 1.2 cm and 0.7  cm. No mass or hydronephrosis visualized. Bladder: Appears normal for degree of bladder distention. IMPRESSION: 1. No evidence of hydronephrosis. 2. Nonobstructing bilateral renal stones seen. Electronically Signed   By: Roanna RaiderJeffery  Chang M.D.   On: 12/18/2015 02:05   I have personally reviewed and evaluated these images and lab results as part of my medical decision-making.   EKG Interpretation None      MDM   Final diagnoses:  Renal colic   Filed Vitals:   12/17/15 2032 12/18/15 0043  BP: 129/91 120/78  Pulse: 96 59  Temp: 97.8 F (36.6 C)   TempSrc: Oral   Resp: 16 13  SpO2: 99% 100%    Medications  ondansetron (ZOFRAN) injection 4 mg (4 mg Intravenous Given 12/18/15 0034)  HYDROmorphone (DILAUDID) injection 0.5 mg (0.5 mg Intravenous Given 12/18/15 0033)  HYDROmorphone (DILAUDID) injection 0.5 mg (0.5 mg Intravenous Given 12/18/15 0211)    Sheliah MendsRussell K Dickman is 24 y.o. male presenting with flank pain and hematuria, h/o kidney stones with urologic intervention to pass the stones. Patient is afebrile, very comfortable and well appearing, large amount of hemoglobin in urine. He does have nitrites and leukocytes. Discussed this with attending physician, recommend urine culture. Renal ultrasound without hydronephrosis. Patient will be encouraged to follow closely with urology.  Evaluation does not show pathology that would require ongoing emergent intervention or inpatient treatment. Pt is hemodynamically stable and mentating appropriately. Discussed findings and plan with patient/guardian, who agrees with care plan. All questions answered. Return precautions discussed and outpatient follow up given.   New Prescriptions   HYDROCODONE-ACETAMINOPHEN (NORCO/VICODIN) 5-325 MG TABLET    Take 1-2 tablets by mouth every 6 hours as needed for pain and/or cough.   PROMETHAZINE (PHENERGAN) 25 MG TABLET    Take 1 tablet (25 mg total) by mouth every 6 (six) hours as needed for nausea or vomiting.          Wynetta Emeryicole Londen Bok, PA-C 12/18/15 [id number]  April Palumbo, MD 12/18/15 [phone]

## 2015-12-18 NOTE — Discharge Instructions (Signed)
Push fluids: take small frequent sips of water or Gatorade, do not drink any soda, juice or caffeinated beverages.  Take vicodin for breakthrough pain, do not drink alcohol, drive, care for children or do other critical tasks while taking vicodin.  Follow with your urologist.   Return to the ED if you develop fever, have vomiting that is not controlled with the medication or if pain becomes too severe.  Please follow with your primary care doctor in the next 2 days for a check-up. They must obtain records for further management.   Do not hesitate to return to the Emergency Department for any new, worsening or concerning symptoms.   Kidney Stones Kidney stones (urolithiasis) are deposits that form inside your kidneys. The intense pain is caused by the stone moving through the urinary tract. When the stone moves, the ureter goes into spasm around the stone. The stone is usually passed in the urine.  CAUSES   A disorder that makes certain neck glands produce too much parathyroid hormone (primary hyperparathyroidism).  A buildup of uric acid crystals, similar to gout in your joints.  Narrowing (stricture) of the ureter.  A kidney obstruction present at birth (congenital obstruction).  Previous surgery on the kidney or ureters.  Numerous kidney infections. SYMPTOMS   Feeling sick to your stomach (nauseous).  Throwing up (vomiting).  Blood in the urine (hematuria).  Pain that usually spreads (radiates) to the groin.  Frequency or urgency of urination. DIAGNOSIS   Taking a history and physical exam.  Blood or urine tests.  CT scan.  Occasionally, an examination of the inside of the urinary bladder (cystoscopy) is performed. TREATMENT   Observation.  Increasing your fluid intake.  Extracorporeal shock wave lithotripsy--This is a noninvasive procedure that uses shock waves to break up kidney stones.  Surgery may be needed if you have severe pain or persistent  obstruction. There are various surgical procedures. Most of the procedures are performed with the use of small instruments. Only small incisions are needed to accommodate these instruments, so recovery time is minimized. The size, location, and chemical composition are all important variables that will determine the proper choice of action for you. Talk to your health care provider to better understand your situation so that you will minimize the risk of injury to yourself and your kidney.  HOME CARE INSTRUCTIONS   Drink enough water and fluids to keep your urine clear or pale yellow. This will help you to pass the stone or stone fragments.  Strain all urine through the provided strainer. Keep all particulate matter and stones for your health care provider to see. The stone causing the pain may be as small as a grain of salt. It is very important to use the strainer each and every time you pass your urine. The collection of your stone will allow your health care provider to analyze it and verify that a stone has actually passed. The stone analysis will often identify what you can do to reduce the incidence of recurrences.  Only take over-the-counter or prescription medicines for pain, discomfort, or fever as directed by your health care provider.  Keep all follow-up visits as told by your health care provider. This is important.  Get follow-up X-rays if required. The absence of pain does not always mean that the stone has passed. It may have only stopped moving. If the urine remains completely obstructed, it can cause loss of kidney function or even complete destruction of the kidney. It is your  responsibility to make sure X-rays and follow-ups are completed. Ultrasounds of the kidney can show blockages and the status of the kidney. Ultrasounds are not associated with any radiation and can be performed easily in a matter of minutes.  Make changes to your daily diet as told by your health care provider.  You may be told to:  Limit the amount of salt that you eat.  Eat 5 or more servings of fruits and vegetables each day.  Limit the amount of meat, poultry, fish, and eggs that you eat.  Collect a 24-hour urine sample as told by your health care provider.You may need to collect another urine sample every 6-12 months. SEEK MEDICAL CARE IF:  You experience pain that is progressive and unresponsive to any pain medicine you have been prescribed. SEEK IMMEDIATE MEDICAL CARE IF:   Pain cannot be controlled with the prescribed medicine.  You have a fever or shaking chills.  The severity or intensity of pain increases over 18 hours and is not relieved by pain medicine.  You develop a new onset of abdominal pain.  You feel faint or pass out.  You are unable to urinate.   This information is not intended to replace advice given to you by your health care provider. Make sure you discuss any questions you have with your health care provider.   Document Released: 12/06/2005 Document Revised: 08/27/2015 Document Reviewed: 05/09/2013 Elsevier Interactive Patient Education [employer]2016 [employer] Inc[employer]

## 2015-12-18 NOTE — ED Notes (Signed)
Provider at bedside

## 2015-12-18 NOTE — ED Notes (Signed)
Ultrasound at bedside

## 2015-12-19 LAB — URINE CULTURE: Culture: 2000

## 2016-01-02 ENCOUNTER — Ambulatory Visit: Payer: Self-pay | Admitting: Nurse Practitioner

## 2016-01-02 IMAGING — CT CT RENAL STONE PROTOCOL
3 of 4 series · 10 of 46 positions shown, 17 images · non-contrast
Comparison: Radiograph dated 01/24/2015

CLINICAL DATA: 24-year-old male with history of renal stones and
left flank pain.

EXAM:
CT ABDOMEN AND PELVIS WITHOUT CONTRAST
TECHNIQUE: Multidetector CT imaging of the abdomen and pelvis was performed
following the standard protocol without IV contrast.

[Series 3: lung 5.0 b60f · axial · 0.76mm/px · z∈[-124,-24]mm · 6 of 28 slices shown, 11 images]
[im 4/28  soft-tissue]
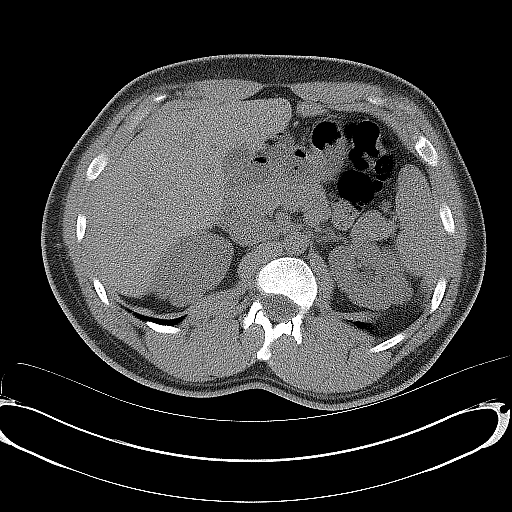
[im 4/28  bone]
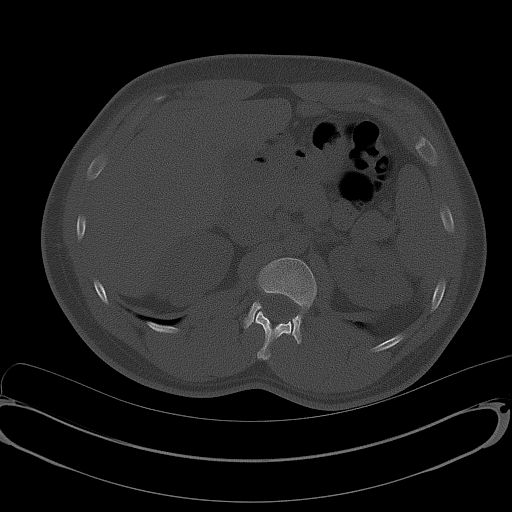
[im 8/28  soft-tissue]
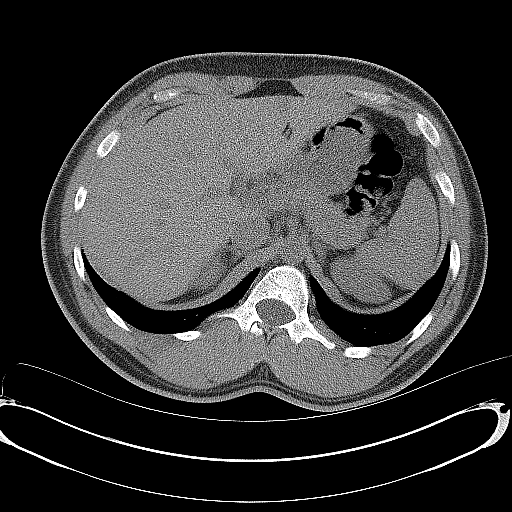
[im 12/28  soft-tissue]
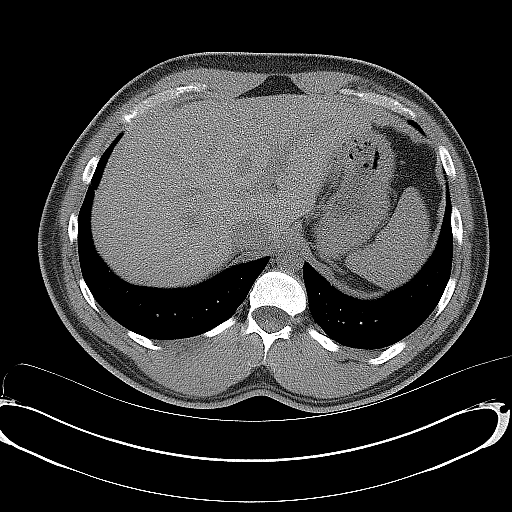
[im 12/28  lung]
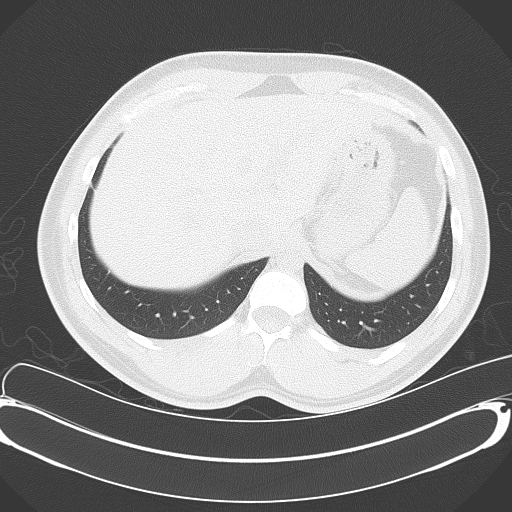
[im 16/28  soft-tissue]
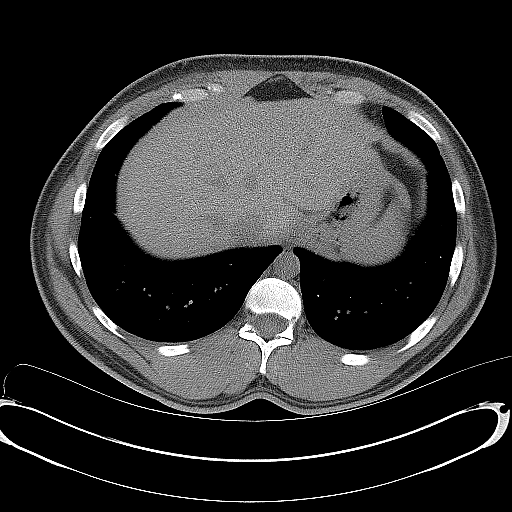
[im 16/28  lung]
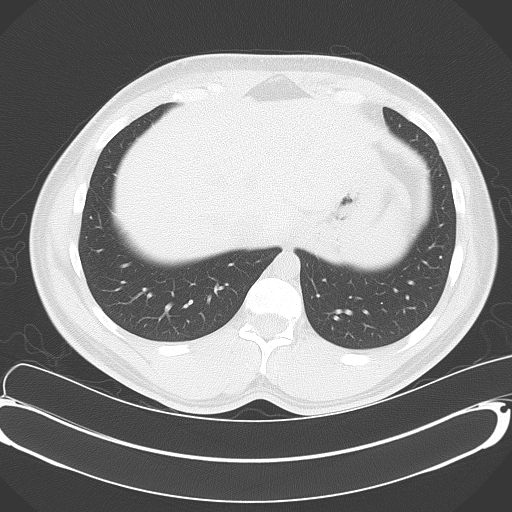
[im 20/28  soft-tissue]
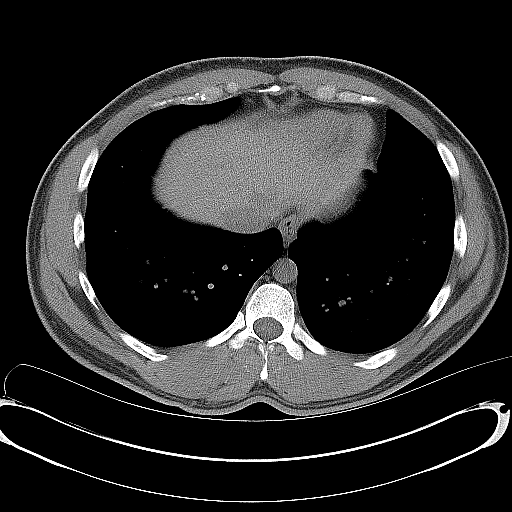
[im 20/28  lung]
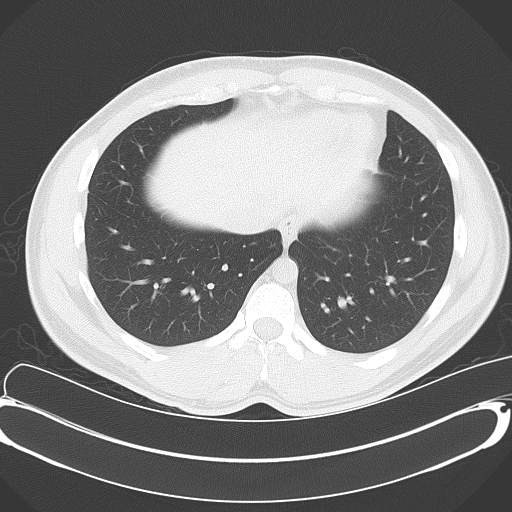
[im 24/28  soft-tissue]
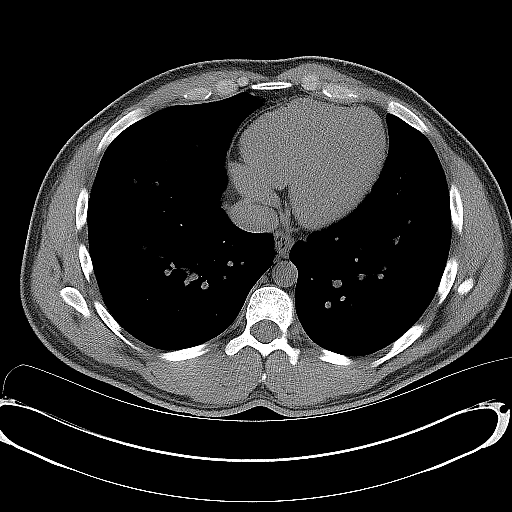
[im 24/28  lung]
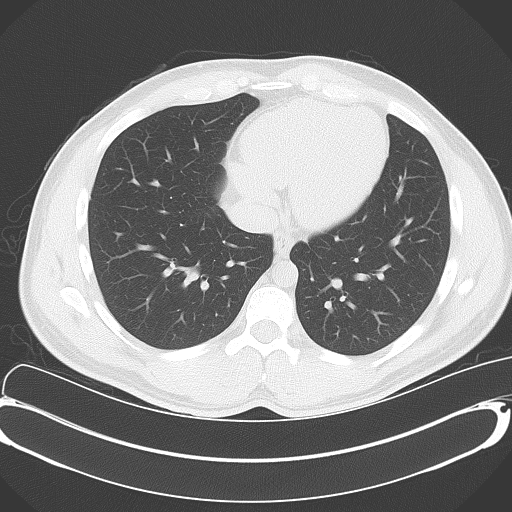

[Series 4: mpr coronal 3.0mm · coronal · 0.73mm/px · 3 of 85 slices shown, 4 images]
[im 29/85  soft-tissue]
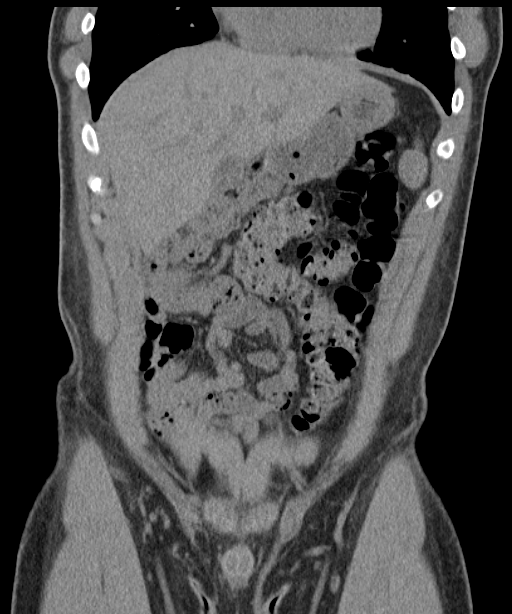
[im 38/85  soft-tissue]
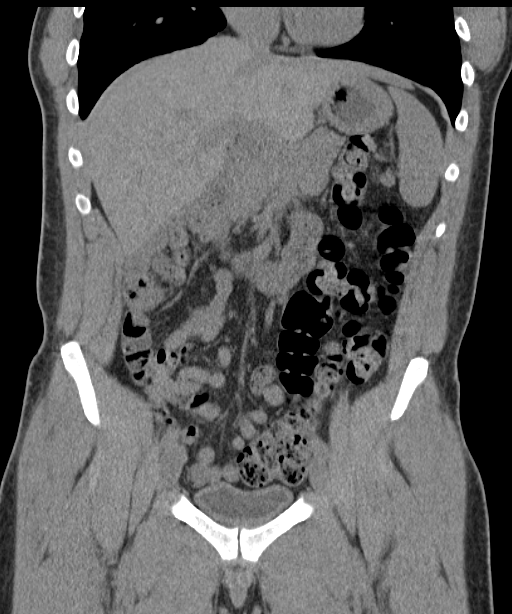
[im 38/85  bone]
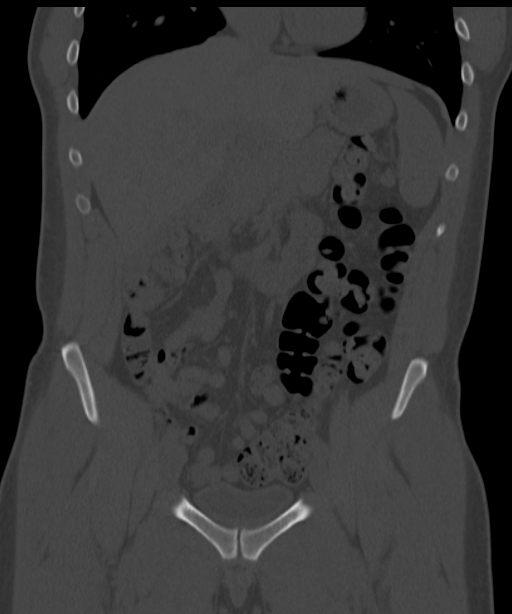
[im 47/85  soft-tissue]
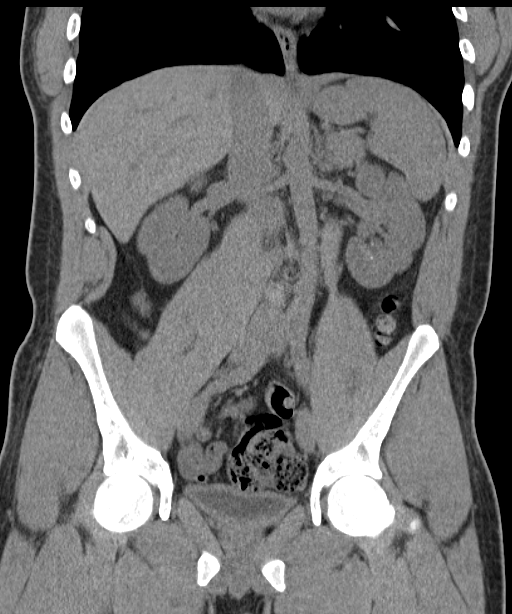

[Series 5: mpr sagittal 3.0mm · sagittal · 0.59mm/px · 1 of 119 slices shown, 2 images]
[im 40/119  soft-tissue]
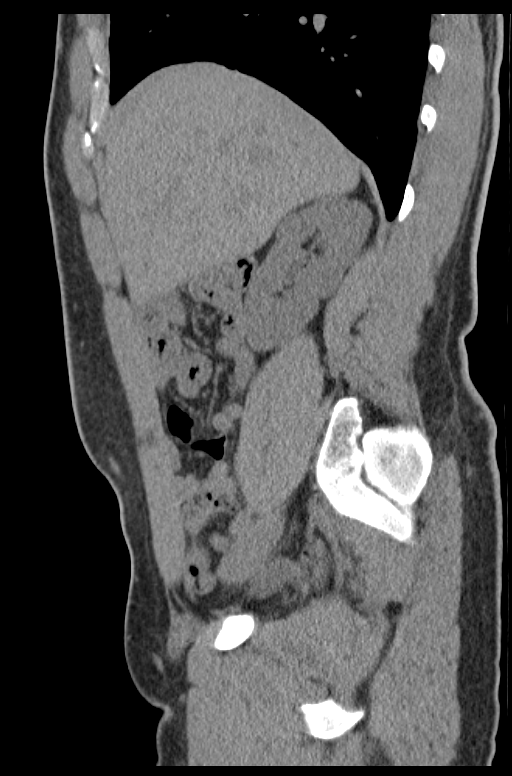
[im 40/119  bone]
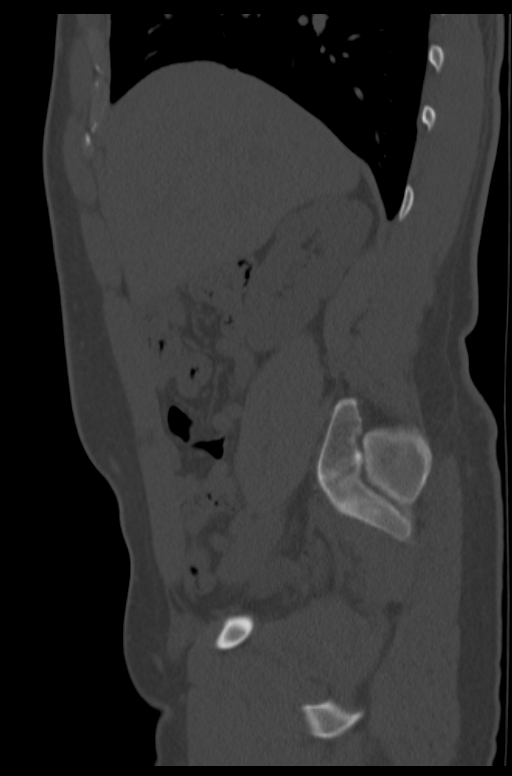

[10 of 46 positions shown; findings below may reference images not displayed]

FINDINGS: Evaluation of this exam is limited in the absence of intravenous
contrast.

The visualized lung bases are clear.

No intra-abdominal free air or free fluid.

The liver, gallbladder, pancreas, spleen, and adrenal glands appear
unremarkable. There are bilateral nonobstructing renal calculi
measuring up to 1.3 cm in the inferior pole of the left kidney.
There is no hydronephrosis on either side. There is bilateral renal
cortical or lobularity. The visualized ureters and urinary bladder
appear unremarkable. The prostate gland is within normal limits by
size criteria.

Copious amount of stool noted throughout the colon. There is no
evidence of bowel obstruction or inflammation. The appendix is not
visualized with certainty. No inflammatory changes identified in the
right lower quadrant.

The visualized abdominal aorta and IVC are grossly unremarkable on
this noncontrast study. There is no lymphadenopathy. There is
scoliosis of the lumbar spine. No acute fracture.
IMPRESSION: Bilateral nonobstructing renal calculi.  No hydronephrosis.

## 2016-01-06 IMAGING — DX DG ABDOMEN 1V
1 series · 1 of 1 positions shown · non-contrast
Comparison: CT scan dated 06/26/2015

CLINICAL DATA: Left-sided flank pain for 2 days.  Nausea.

EXAM:
ABDOMEN - 1 VIEW

[abdomen supine]
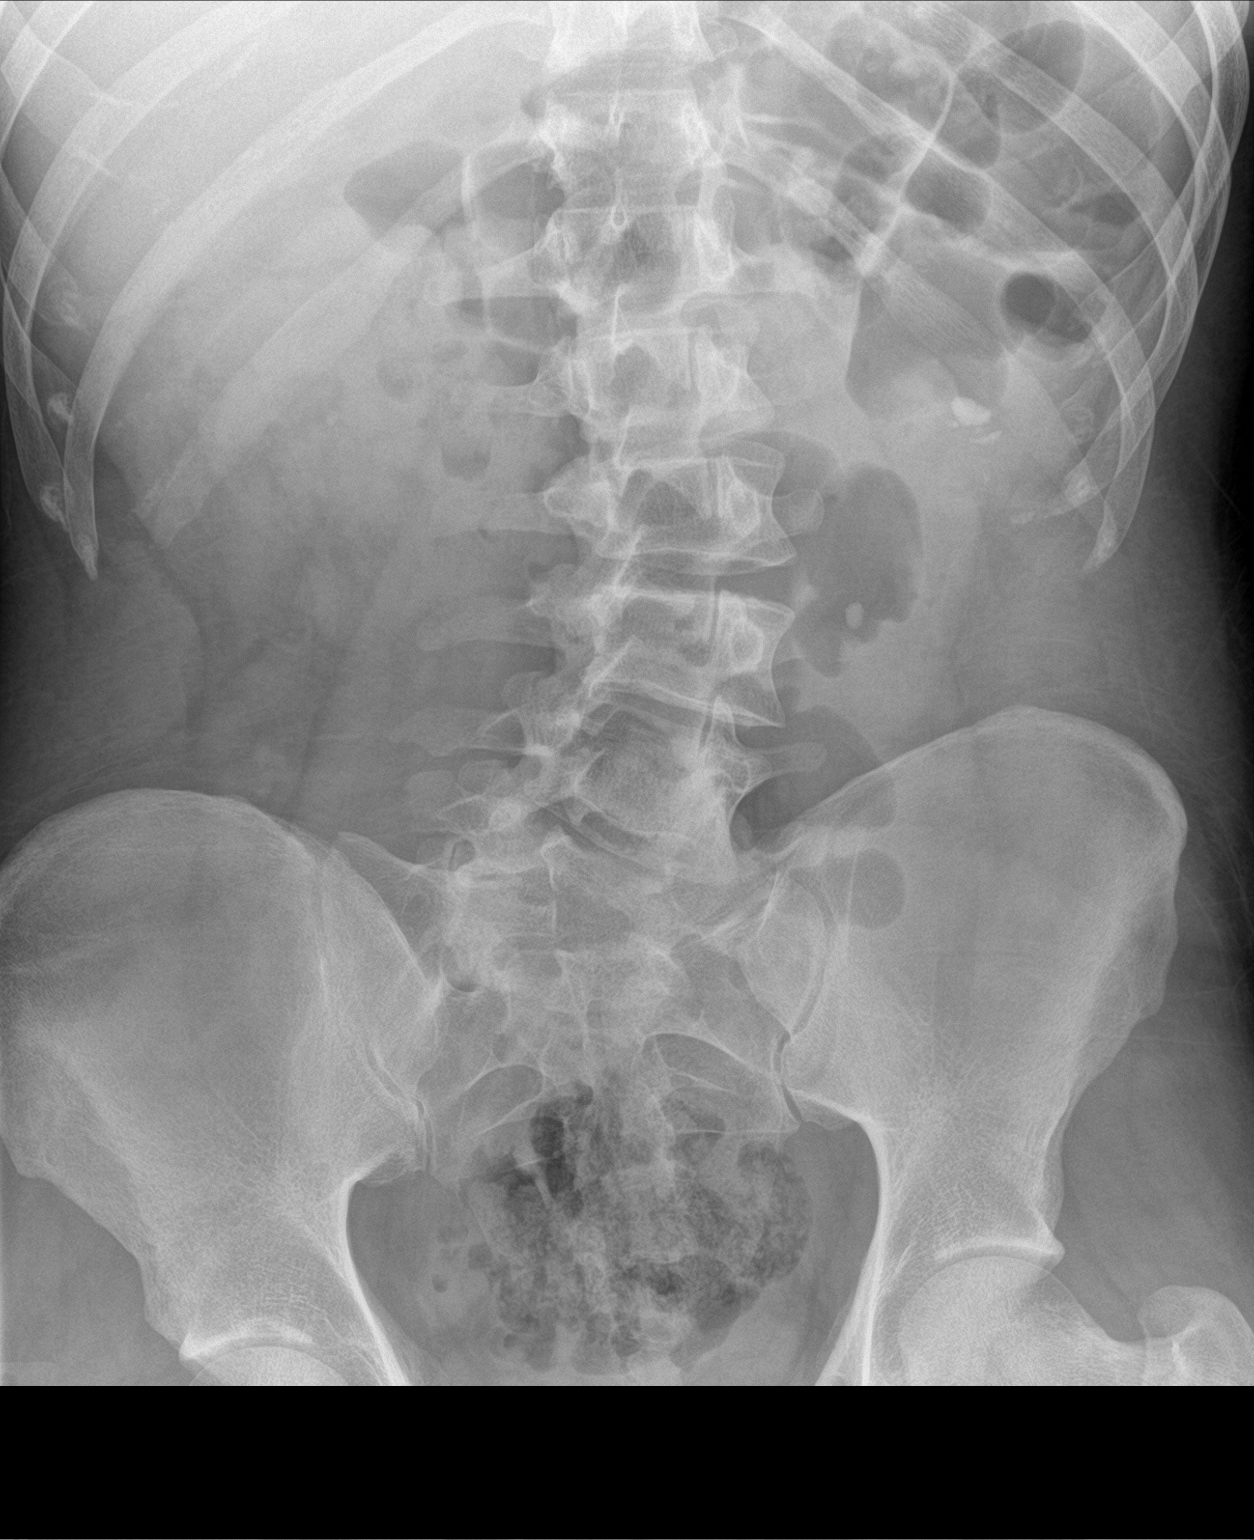

[1 of 1 positions shown; findings below may reference images not displayed]

FINDINGS: There is 7 mm oblong stone in the mid left ureter at the L3-4 level,
new since the prior exam. There are multiple stones in the left
kidney. Small stone in the lower pole of the right kidney.

Bowel gas pattern is normal.  Stable lumbar scoliosis.
IMPRESSION: New 7 mm stone in the mid left ureter.  Multiple renal calculi.

## 2016-01-09 ENCOUNTER — Encounter: Payer: Self-pay | Admitting: Nurse Practitioner

## 2016-01-09 ENCOUNTER — Ambulatory Visit (INDEPENDENT_AMBULATORY_CARE_PROVIDER_SITE_OTHER): Payer: BLUE CROSS/BLUE SHIELD | Admitting: Nurse Practitioner

## 2016-01-09 VITALS — BP 112/70 | Ht 69.0 in | Wt 202.4 lb

## 2016-01-09 DIAGNOSIS — G43009 Migraine without aura, not intractable, without status migrainosus: Secondary | ICD-10-CM

## 2016-01-09 MED ORDER — RIZATRIPTAN BENZOATE 10 MG PO TBDP: 10.0000 mg | ORAL_TABLET | ORAL | Status: DC | PRN

## 2016-01-09 MED ORDER — TOPIRAMATE 100 MG PO TABS: 100.0000 mg | ORAL_TABLET | Freq: Every day | ORAL | Status: DC

## 2016-01-11 ENCOUNTER — Encounter: Payer: Self-pay | Admitting: Nurse Practitioner

## 2016-01-11 NOTE — Progress Notes (Signed)
Subjective:  Presents for recheck on his migraines. 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 Has noticed almost daily migraines for the past 4 weeks. No change in symptomatology. Some relief with Maxalt. Tries to avoid excessive analgesic use.  Objective:   BP 112/70 mmHg  Ht  (1.753 m)  Wt 202 lb 6 oz (91.797 kg)  BMI 29.87 kg/m2 NAD. Alert, oriented. TMs minimal clear effusion, no erythema. Pharynx clear. Neck supple with mild soft anterior adenopathy. Lungs clear. Heart regular rate rhythm. Tight tender muscles noted along the upper back and neck area.  Assessment:  Problem List Items Addressed This Visit      Cardiovascular and Mediastinum   Migraine headache without aura - Primary   Relevant Medications   topiramate (TOPAMAX) 100 MG tablet   rizatriptan (MAXALT-MLT) 10 MG disintegrating tablet     Plan:  Meds ordered this encounter  Medications  . topiramate (TOPAMAX) 100 MG tablet    Sig: Take 1 tablet (100 mg total) by mouth at bedtime. 1/2 po qam and one po qhs    Dispense:  45 tablet    Refill:  2    Order Specific Question:  Supervising Provider    Answer:  Merlyn Albert [[id number]]  . rizatriptan (MAXALT-MLT) 10 MG disintegrating tablet    Sig: Take 1 tablet (10 mg total) by mouth as needed for migraine. May repeat in 2 hours if needed; max 2 per 24 hrs    Dispense:  10 tablet    Refill:  2    Order Specific Question:  Supervising Provider    Answer:  Merlyn Albert [[id number]]   Increase Topamax dose. Call back in 2-3 weeks, may need to increase again. Discussed importance of stress reduction. Recommend ice/heat applications to the neck area, massage therapy and OTC TENS unit. Return in about 4 months (around 05/08/2016) for recheck migraines. Call back sooner if needed.

## 2016-01-12 IMAGING — CR DG ABDOMEN 1V
1 series · 1 of 1 positions shown · non-contrast
Comparison: Radiograph dated 06/30/2015 and CT dated 06/26/2015

CLINICAL DATA: 24-year-old male with bilateral flank pain and
history of kidney stones.

EXAM:
ABDOMEN - 1 VIEW

[t abdomen supine]
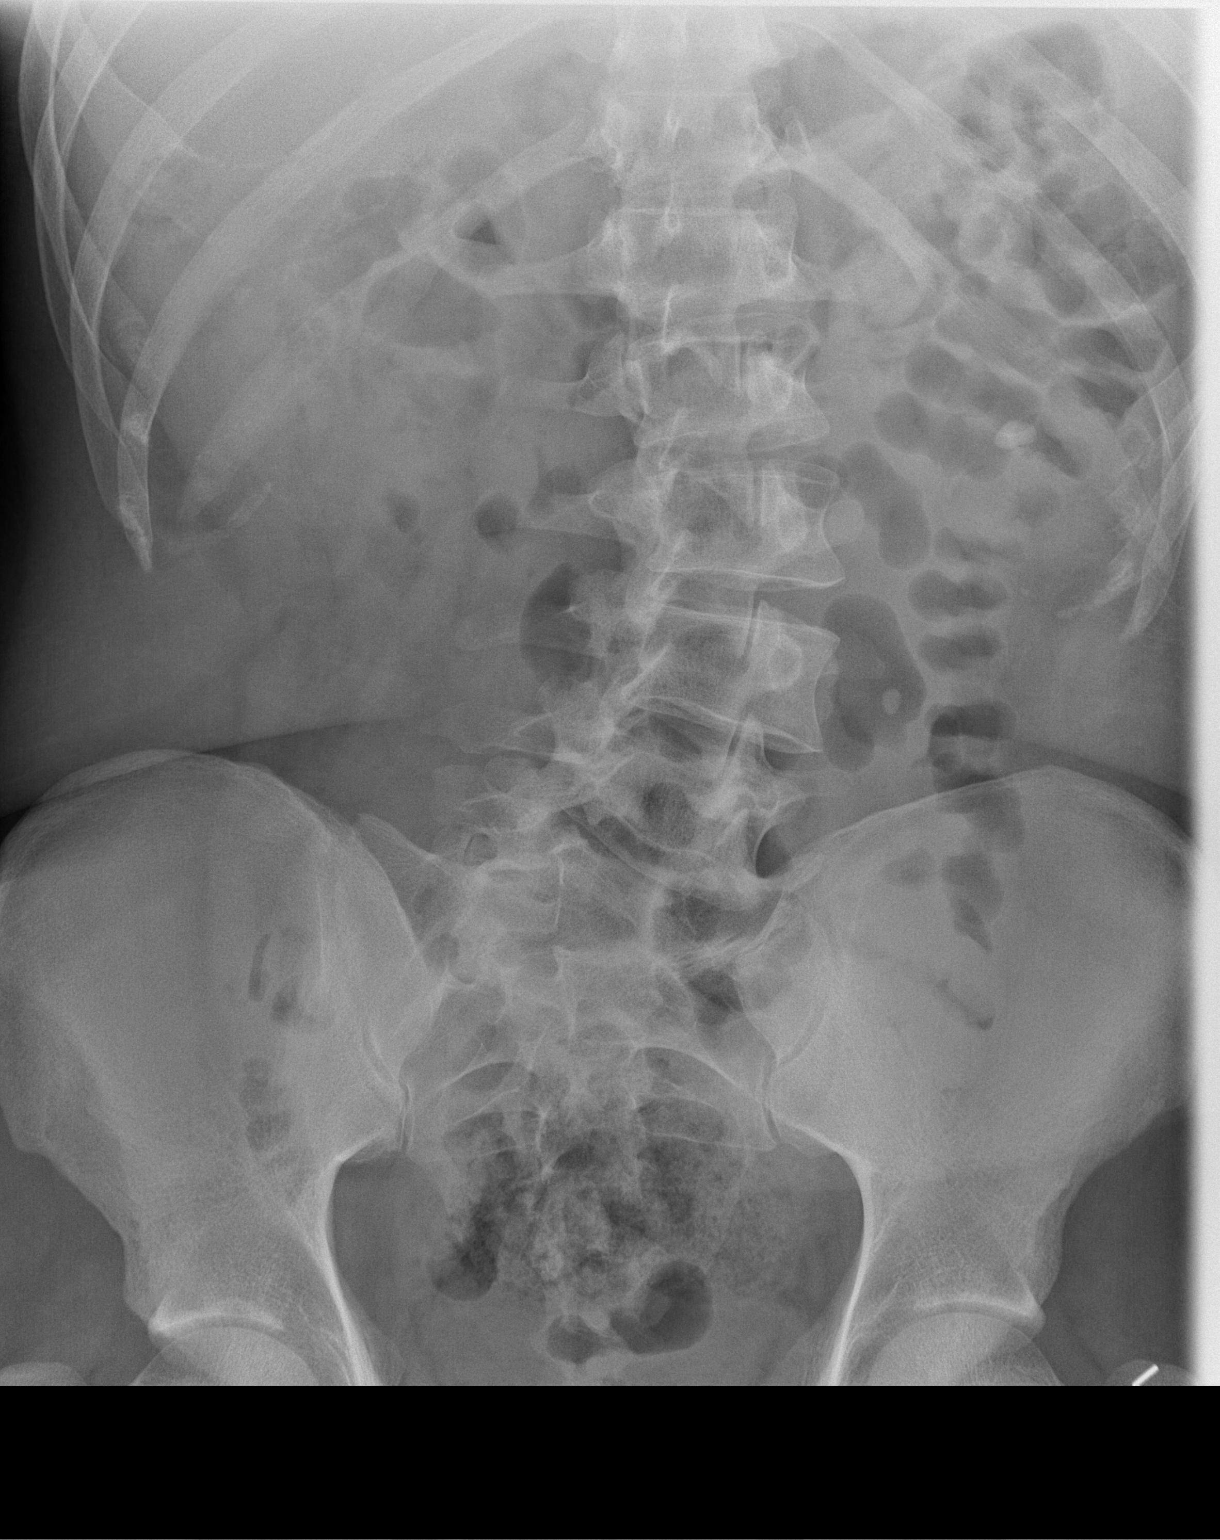

[1 of 1 positions shown; findings below may reference images not displayed]

FINDINGS: There is a stable 13 mm calculus over the inferior left renal
silhouette. A stable appearing 7 mm high attenuating density is also
noted along the course of the left ureter at the level of the L3
vertebra similar in position or approximately 1 cm more distally
compared the prior study.
IMPRESSION: Left renal calculus as well as 7 mm calculus along the course of the
mid left ureter in stable positioning or slightly more distally
compared to prior study.

## 2016-01-15 ENCOUNTER — Emergency Department (HOSPITAL_COMMUNITY)
Admission: EM | Admit: 2016-01-15 | Discharge: 2016-01-15 | Disposition: A | Discharge status: Home / Self Care | Payer: BLUE CROSS/BLUE SHIELD | Attending: Emergency Medicine | Admitting: Emergency Medicine

## 2016-01-15 ENCOUNTER — Encounter (HOSPITAL_COMMUNITY): Payer: Self-pay | Admitting: Emergency Medicine

## 2016-01-15 ENCOUNTER — Emergency Department (HOSPITAL_COMMUNITY): Payer: BLUE CROSS/BLUE SHIELD

## 2016-01-15 DIAGNOSIS — N200 Calculus of kidney: Secondary | ICD-10-CM | POA: Diagnosis not present

## 2016-01-15 DIAGNOSIS — R109 Unspecified abdominal pain: Secondary | ICD-10-CM

## 2016-01-15 DIAGNOSIS — Z973 Presence of spectacles and contact lenses: Secondary | ICD-10-CM | POA: Diagnosis not present

## 2016-01-15 DIAGNOSIS — Z79899 Other long term (current) drug therapy: Secondary | ICD-10-CM | POA: Insufficient documentation

## 2016-01-15 DIAGNOSIS — G43909 Migraine, unspecified, not intractable, without status migrainosus: Secondary | ICD-10-CM | POA: Diagnosis not present

## 2016-01-15 LAB — BASIC METABOLIC PANEL
Anion gap: 8 (ref 5–15)
BUN: 16 mg/dL (ref 6–20)
CO2: 24 mmol/L (ref 22–32)
Calcium: 9.5 mg/dL (ref 8.9–10.3)
Chloride: 109 mmol/L (ref 101–111)
Creatinine, Ser: 1.27 mg/dL — ABNORMAL HIGH (ref 0.61–1.24)
GFR calc Af Amer: >60 mL/min (ref >60)
GFR calc non Af Amer: >60 mL/min (ref >60)
Glucose, Bld: 112 mg/dL — ABNORMAL HIGH (ref 65–99)
Potassium: 4.2 mmol/L (ref 3.5–5.1)
Sodium: 141 mmol/L (ref 135–145)

## 2016-01-15 LAB — URINALYSIS, ROUTINE W REFLEX MICROSCOPIC
Bilirubin Urine: NEGATIVE
Glucose, UA: NEGATIVE mg/dL
Ketones, ur: NEGATIVE mg/dL
Leukocytes, UA: NEGATIVE
Nitrite: NEGATIVE
Protein, ur: NEGATIVE mg/dL
Specific Gravity, Urine: 1.02 (ref 1.005–1.030)
pH: 5 (ref 5.0–8.0)

## 2016-01-15 LAB — CBC
HCT: 44.9 % (ref 39.0–52.0)
Hemoglobin: 15.5 g/dL (ref 13.0–17.0)
MCH: 30.6 pg (ref 26.0–34.0)
MCHC: 34.5 g/dL (ref 30.0–36.0)
MCV: 88.7 fL (ref 78.0–100.0)
Platelets: 188 10*3/uL (ref 150–400)
RBC: 5.06 MIL/uL (ref 4.22–5.81)
RDW: 12.6 % (ref 11.5–15.5)
WBC: 6.8 10*3/uL (ref 4.0–10.5)

## 2016-01-15 LAB — URINE MICROSCOPIC-ADD ON

## 2016-01-15 MED ORDER — OXYCODONE-ACETAMINOPHEN 5-325 MG PO TABS: 1.0000 | ORAL_TABLET | ORAL | Status: DC | PRN

## 2016-01-15 MED ORDER — ONDANSETRON HCL 4 MG/2ML IJ SOLN
4.0000 mg | Freq: Once | INTRAMUSCULAR | Status: AC
  Administered 2016-01-15: 4 mg via INTRAVENOUS
  Filled 2016-01-15: qty 2

## 2016-01-15 MED ORDER — KETOROLAC TROMETHAMINE 30 MG/ML IJ SOLN
30.0000 mg | Freq: Once | INTRAMUSCULAR | Status: AC
  Administered 2016-01-15: 30 mg via INTRAVENOUS
  Filled 2016-01-15: qty 1

## 2016-01-15 MED ORDER — HYDROMORPHONE HCL 1 MG/ML IJ SOLN
1.0000 mg | Freq: Once | INTRAMUSCULAR | Status: AC
  Administered 2016-01-15: 1 mg via INTRAVENOUS
  Filled 2016-01-15: qty 1

## 2016-01-15 NOTE — Discharge Instructions (Signed)
Please and contact [facility] inform them of your visit today and all relevant data, please schedule follow-up evaluation today. If her pain is uncontrollable, he had any nausea or vomiting, fever, please return to the emergency room for reevaluation.

## 2016-01-15 NOTE — ED Notes (Signed)
Nurse drawing labs. 

## 2016-01-15 NOTE — ED Notes (Signed)
Pt from home with c/o left flank pain rated at 10/10. Pt also has c/o of nausea and emesis (4 times) secondary to pain. When he last saw his urologist, he was told he had a 1 cm stone in his left kidney

## 2016-01-15 NOTE — ED Provider Notes (Signed)
CSN: [id number]     Arrival date & time 01/15/16  4098 History   First MD Initiated Contact with Patient 01/15/16 [phone]     Chief Complaint  Patient presents with  . Flank Pain    hx of kidney stone    HPI   25 year old male presents today with left flank pain. Patient reports pain is sharp in nature, stabbing on his left posterior flank. He reports symptoms are most consistent with his previous attacks of nephrolithiasis. Patient denies any difficulty urinating, blood in his urine, fever, chills, nausea or vomiting. Patient reports that he had not followed up with urology after his last ED visit for nephrolithiasis. Patient reports significant past medical history stents and lithotripsy.    Past Medical History  Diagnosis Date  . Migraine   . Complication of anesthesia   . PONV (postoperative nausea and vomiting)   . Left nephrolithiasis   . Wears glasses   . History of kidney stones    Past Surgical History  Procedure Laterality Date  . Foot capsule release w/ percutaneous heel cord lengthening, tibial tendon transfer Left 1994    clubfoot  . Lymph gland excision  2003    neck--  benign  . Cystoscopy with retrograde pyelogram, ureteroscopy and stent placement Left 06/24/2014    Procedure: CYSTOSCOPY WITH RETROGRADE PYELOGRAM,  AND STENT PLACEMENT;  Surgeon: Magdalene Molly, MD;  Location: [facility];  Service: Urology;  Laterality: Left;  . Cystoscopy with retrograde pyelogram, ureteroscopy and stent placement Left 07/05/2014    Procedure: CYSTO/LEFT URETEROSCOPY/LEFT RETROGRADE PYELOGRAM/LEFT STENT PLACEMENT;  Surgeon: Jerilee Field, MD;  Location: [facility];  Service: Urology;  Laterality: Left;  . Holmium laser application Left 07/05/2014    Procedure: LASER LITHO;  Surgeon: Jerilee Field, MD;  Location: [facility];  Service: Urology;  Laterality: Left;  . Cystoscopy with retrograde pyelogram, ureteroscopy and stent placement Left 07/06/2015     Procedure: CYSTOSCOPY WITH RETROGRADE PYELOGRAM, URETEROSCOPY , LASER, STENT PLACEMENT and BASKET EXTRACTION;  Surgeon: Jerilee Field, MD;  Location: [facility];  Service: Urology;  Laterality: Left;  . Cystoscopy w/ retrogrades Right 07/06/2015    Procedure: CYSTOSCOPY WITH RETROGRADE PYELOGRAM;  Surgeon: Jerilee Field, MD;  Location: [facility];  Service: Urology;  Laterality: Right;  . Extracorporeal shock wave lithotripsy Left 07-07-2015  &  12-26-2014  . Holmium laser application Left 08/08/2015    Procedure: HOLMIUM LASER  WITH LITHOTRIPSY ;  Surgeon: Jerilee Field, MD;  Location: [facility];  Service: Urology;  Laterality: Left;  . Cystoscopy/retrograde/ureteroscopy/stone extraction with basket Left 08/08/2015    Procedure: CYSTOSCOPY/RETROGRADE/URETEROSCOPY/STONE EXTRACTION WITH BASKET;  Surgeon: Jerilee Field, MD;  Location: [facility];  Service: Urology;  Laterality: Left;  . Cystoscopy w/ ureteral stent placement Left 08/08/2015    Procedure: CYSTOSCOPY WITH STENT REPLACEMENT;  Surgeon: Jerilee Field, MD;  Location: [facility];  Service: Urology;  Laterality: Left;  . Cystoscopy with retrograde pyelogram, ureteroscopy and stent placement Left 08/19/2015    Procedure: CYSTOSCOPY WITH RETROGRADE PYELOGRAM, URETEROSCOPY AND STENT PLACEMENT;  Surgeon: Jerilee Field, MD;  Location: [facility];  Service: Urology;  Laterality: Left;   Family History  Problem Relation Age of Onset  . Cancer Father   . Cancer Other   . Hypertension Other   . Hyperlipidemia Other   . Stroke Other   . Kidney Stones Mother   . Kidney Stones Brother    Social History  Substance Use Topics  .  Smoking status: Never Smoker   . Smokeless tobacco: Never Used  . Alcohol Use: 0.0 oz/week    0 Standard drinks or equivalent per week     Comment: occ    Review of Systems  All other systems reviewed and are negative.   Allergies  Codeine and Adhesive  Home  Medications   Prior to Admission medications   Medication Sig Start Date End Date Taking? Authorizing Provider  aspirin-acetaminophen-caffeine (EXCEDRIN MIGRAINE) [phone] MG tablet Take 1 tablet by mouth every 6 (six) hours as needed for headache or migraine.   Yes Historical Provider, MD  naproxen sodium (ANAPROX) 220 MG tablet Take 400 mg by mouth every 12 (twelve) hours as needed (pain).   Yes Historical Provider, MD  rizatriptan (MAXALT-MLT) 10 MG disintegrating tablet Take 1 tablet (10 mg total) by mouth as needed for migraine. May repeat in 2 hours if needed; max 2 per 24 hrs 01/09/16  Yes Campbell Riches, NP  topiramate (TOPAMAX) 100 MG tablet Take 1 tablet (100 mg total) by mouth at bedtime. 1/2 po qam and one po qhs Patient taking differently: Take 150 mg by mouth daily.  01/09/16  Yes Campbell Riches, NP  HYDROcodone-acetaminophen (NORCO/VICODIN) 5-325 MG tablet Take 1-2 tablets by mouth every 6 hours as needed for pain and/or cough. Patient not taking: Reported on 01/09/2016 12/18/15   Joni Reining Pisciotta, PA-C  HYDROmorphone (DILAUDID) 4 MG tablet Take 1 tablet (4 mg total) by mouth every 6 (six) hours as needed for severe pain. Patient not taking: Reported on 12/17/2015 08/19/15   Ripudeep Jenna Luo, MD  oxyCODONE-acetaminophen (PERCOCET/ROXICET) 5-325 MG tablet Take 1 tablet by mouth every 4 (four) hours as needed for severe pain. 01/15/16   Eyvonne Mechanic, PA-C  promethazine (PHENERGAN) 25 MG tablet Take 1 tablet (25 mg total) by mouth every 6 (six) hours as needed for nausea or vomiting. Patient not taking: Reported on 01/15/2016 12/18/15   Joni Reining Pisciotta, PA-C  tamsulosin (FLOMAX) 0.4 MG CAPS capsule Take 1 capsule (0.4 mg total) by mouth daily after supper. Patient not taking: Reported on 08/18/2015 07/07/15   Jerilee Field, MD   BP 142/97 mmHg  Pulse 80  Temp(Src) 98.6 F (37 C) (Oral)  Resp 18  Ht  (1.727 m)  Wt 91.627 kg  BMI 30.72 kg/m2  SpO2 100%   Physical Exam   Constitutional: He is oriented to person, place, and time. He appears well-developed and well-nourished.  HENT:  Head: Normocephalic and atraumatic.  Eyes: Conjunctivae are normal. Pupils are equal, round, and reactive to light. Right eye exhibits no discharge. Left eye exhibits no discharge. No scleral icterus.  Neck: Normal range of motion. No JVD present. No tracheal deviation present.  Cardiovascular: Normal rate.   Pulmonary/Chest: Effort normal. No stridor.  Abdominal: Soft. He exhibits no distension and no mass. There is no tenderness. There is no rebound and no guarding.  No significant abdominal tenderness or CVA tenderness  Neurological: He is alert and oriented to person, place, and time. Coordination normal.  Skin: Skin is warm and dry. No rash noted. No erythema. No pallor.  Psychiatric: He has a normal mood and affect. His behavior is normal. Judgment and thought content normal.  Nursing note and vitals reviewed.     ED Course  Procedures (including critical care time) Labs Review Labs Reviewed  URINALYSIS, ROUTINE W REFLEX MICROSCOPIC (NOT AT [facility]) - Abnormal; Notable for the following:    Hgb urine dipstick LARGE (*)  All other components within normal limits  BASIC METABOLIC PANEL - Abnormal; Notable for the following:    Glucose, Bld 112 (*)    Creatinine, Ser 1.27 (*)    All other components within normal limits  URINE MICROSCOPIC-ADD ON - Abnormal; Notable for the following:    Squamous Epithelial / LPF 0-5 (*)    Bacteria, UA RARE (*)    All other components within normal limits  CBC    Imaging Review Ct Renal Stone Study  01/15/2016  CLINICAL DATA:  Acute onset of left flank pain, nausea and vomiting. Microhematuria. Initial encounter. EXAM: CT ABDOMEN AND PELVIS WITHOUT CONTRAST TECHNIQUE: Multidetector CT imaging of the abdomen and pelvis was performed following the standard protocol without IV contrast. COMPARISON:  CT of the abdomen and pelvis from  06/26/2015, and renal ultrasound performed 12/24/2015 FINDINGS: The visualized lung bases are clear. The liver and spleen are unremarkable in appearance. The gallbladder is within normal limits. The pancreas and adrenal glands are unremarkable. There is mild-to-moderate left-sided hydronephrosis, with an obstructing 8 x 4 mm stone noted at the proximal to mid left ureter, 7 cm below the left renal pelvis. Mild left-sided perinephric stranding is noted. Scattered nonobstructing bilateral renal stones are noted, larger on the left, measuring up to 1.3 cm in size. No free fluid is identified. The small bowel is situated entirely on the right side of the abdomen, while the colon is on the left side, compatible with developmental nonrotation. The stomach is within normal limits. No acute vascular abnormalities are seen. The appendix is normal in caliber, noted at the right hemipelvis. The cecum is situated deep within the pelvis. The colon is noted on the left side of the abdomen, and is unremarkable in appearance. The bladder is mildly distended and grossly unremarkable. The prostate remains normal in size. No inguinal lymphadenopathy is seen. No acute osseous abnormalities are identified. Mild left convex lumbar scoliosis is noted. IMPRESSION: 1. Mild-to-moderate left-sided hydronephrosis, with an obstructing 8 x 4 mm stone at the proximal to mid left ureter, 7 cm below the left renal pelvis. 2. Scattered nonobstructing bilateral renal stones, larger on the left, measuring up to 1.3 cm in size. 3. Small bowel noted entirely on the right side of the abdomen, while the colon is on the left side, compatible with developmental nonrotation. 4. Mild left convex lumbar scoliosis noted. Electronically Signed   By: Roanna Raider M.D.   On: 01/15/2016 06:54   I have personally reviewed and evaluated these images and lab results as part of my medical decision-making.   EKG Interpretation None      MDM   Final  diagnoses:  Flank pain  Nephrolithiasis    Labs: Urinalysis, BMP, CBC  Imaging: CT renal  Consults:  Therapeutics:  Discharge Meds:   Assessment/Plan: Patient presents with an obstructing 8 mm stone with no signs of infection. Patient's pain was managed here in ED with Toradol and Dilaudid. Patient has a significant past medical history of stents and lithotripsy done by Dr. Mena Goes of [facility], on call provider consult at who instructed to have patient follow-up in her office today for reevaluation.       Eyvonne Mechanic, PA-C 01/15/16 1610  April Palumbo, MD 01/16/16 [phone]

## 2016-01-16 ENCOUNTER — Encounter (HOSPITAL_COMMUNITY)
Admission: AD | Disposition: A | Discharge status: Home / Self Care | Payer: Self-pay | Source: Ambulatory Visit | Attending: Urology

## 2016-01-16 ENCOUNTER — Ambulatory Visit (HOSPITAL_COMMUNITY): Payer: BLUE CROSS/BLUE SHIELD | Admitting: Anesthesiology

## 2016-01-16 ENCOUNTER — Encounter (HOSPITAL_COMMUNITY): Payer: Self-pay | Admitting: *Deleted

## 2016-01-16 ENCOUNTER — Ambulatory Visit (HOSPITAL_COMMUNITY)
Admission: AD | Admit: 2016-01-16 | Discharge: 2016-01-16 | Disposition: A | Discharge status: Home / Self Care | Payer: BLUE CROSS/BLUE SHIELD | Source: Ambulatory Visit | Attending: Urology | Admitting: Urology

## 2016-01-16 ENCOUNTER — Other Ambulatory Visit: Payer: Self-pay | Admitting: Urology

## 2016-01-16 DIAGNOSIS — Z841 Family history of disorders of kidney and ureter: Secondary | ICD-10-CM | POA: Insufficient documentation

## 2016-01-16 DIAGNOSIS — N200 Calculus of kidney: Secondary | ICD-10-CM | POA: Diagnosis not present

## 2016-01-16 DIAGNOSIS — E669 Obesity, unspecified: Secondary | ICD-10-CM | POA: Diagnosis not present

## 2016-01-16 DIAGNOSIS — Z79891 Long term (current) use of opiate analgesic: Secondary | ICD-10-CM | POA: Diagnosis not present

## 2016-01-16 DIAGNOSIS — Z79899 Other long term (current) drug therapy: Secondary | ICD-10-CM | POA: Diagnosis not present

## 2016-01-16 DIAGNOSIS — Z791 Long term (current) use of non-steroidal anti-inflammatories (NSAID): Secondary | ICD-10-CM | POA: Insufficient documentation

## 2016-01-16 DIAGNOSIS — Z87442 Personal history of urinary calculi: Secondary | ICD-10-CM | POA: Diagnosis not present

## 2016-01-16 DIAGNOSIS — Z6830 Body mass index (BMI) 30.0-30.9, adult: Secondary | ICD-10-CM | POA: Insufficient documentation

## 2016-01-16 DIAGNOSIS — G43909 Migraine, unspecified, not intractable, without status migrainosus: Secondary | ICD-10-CM | POA: Diagnosis not present

## 2016-01-16 DIAGNOSIS — N201 Calculus of ureter: Secondary | ICD-10-CM | POA: Diagnosis present

## 2016-01-16 HISTORY — PX: CYSTOSCOPY WITH URETEROSCOPY AND STENT PLACEMENT: SHX6377

## 2016-01-16 HISTORY — DX: Scoliosis, unspecified: M41.9

## 2016-01-16 SURGERY — CYSTOURETEROSCOPY, WITH STENT INSERTION
Anesthesia: General | Site: Ureter | Laterality: Left

## 2016-01-16 MED ORDER — FENTANYL CITRATE (PF) 100 MCG/2ML IJ SOLN
INTRAMUSCULAR | Status: AC
  Filled 2016-01-16: qty 2

## 2016-01-16 MED ORDER — PROPOFOL 10 MG/ML IV BOLUS
INTRAVENOUS | Status: AC
  Filled 2016-01-16: qty 20

## 2016-01-16 MED ORDER — ACETAMINOPHEN 10 MG/ML IV SOLN
1000.0000 mg | Freq: Once | INTRAVENOUS | Status: AC
  Administered 2016-01-16: 1000 mg via INTRAVENOUS
  Filled 2016-01-16: qty 100

## 2016-01-16 MED ORDER — HYDROMORPHONE HCL 4 MG PO TABS: 4.0000 mg | ORAL_TABLET | Freq: Four times a day (QID) | ORAL | Status: DC | PRN

## 2016-01-16 MED ORDER — ONDANSETRON HCL 4 MG/2ML IJ SOLN
INTRAMUSCULAR | Status: AC
  Filled 2016-01-16: qty 2

## 2016-01-16 MED ORDER — FENTANYL CITRATE (PF) 100 MCG/2ML IJ SOLN
INTRAMUSCULAR | Status: DC | PRN
  Administered 2016-01-16 (×2): 50 ug via INTRAVENOUS

## 2016-01-16 MED ORDER — FENTANYL CITRATE (PF) 100 MCG/2ML IJ SOLN
25.0000 ug | INTRAMUSCULAR | Status: DC | PRN
  Administered 2016-01-16 (×3): 50 ug via INTRAVENOUS

## 2016-01-16 MED ORDER — LIDOCAINE HCL (CARDIAC) 20 MG/ML IV SOLN
INTRAVENOUS | Status: DC | PRN
  Administered 2016-01-16: 100 mg via INTRAVENOUS

## 2016-01-16 MED ORDER — 0.9 % SODIUM CHLORIDE (POUR BTL) OPTIME
TOPICAL | Status: DC | PRN
  Administered 2016-01-16: 1000 mL

## 2016-01-16 MED ORDER — DEXAMETHASONE SODIUM PHOSPHATE 10 MG/ML IJ SOLN
INTRAMUSCULAR | Status: DC | PRN
  Administered 2016-01-16: 10 mg via INTRAVENOUS

## 2016-01-16 MED ORDER — BELLADONNA ALKALOIDS-OPIUM 16.2-60 MG RE SUPP
RECTAL | Status: AC
  Filled 2016-01-16: qty 1

## 2016-01-16 MED ORDER — ONDANSETRON HCL 4 MG/2ML IJ SOLN
INTRAMUSCULAR | Status: DC | PRN
  Administered 2016-01-16: 4 mg via INTRAVENOUS

## 2016-01-16 MED ORDER — IOHEXOL 300 MG/ML  SOLN
INTRAMUSCULAR | Status: DC | PRN
  Administered 2016-01-16: 5 mL via URETHRAL

## 2016-01-16 MED ORDER — HYDROMORPHONE HCL 1 MG/ML IJ SOLN: 0.2500 mg | INTRAMUSCULAR | Status: DC | PRN

## 2016-01-16 MED ORDER — MORPHINE SULFATE (PF) 2 MG/ML IV SOLN
2.0000 mg | INTRAVENOUS | Status: DC | PRN
  Filled 2016-01-16: qty 1

## 2016-01-16 MED ORDER — CEFAZOLIN SODIUM-DEXTROSE 2-3 GM-% IV SOLR
2.0000 g | INTRAVENOUS | Status: AC
  Administered 2016-01-16: 2 g via INTRAVENOUS

## 2016-01-16 MED ORDER — MIDAZOLAM HCL 2 MG/2ML IJ SOLN
INTRAMUSCULAR | Status: AC
  Filled 2016-01-16: qty 2

## 2016-01-16 MED ORDER — LIDOCAINE HCL 2 % EX GEL
CUTANEOUS | Status: AC
  Filled 2016-01-16: qty 5

## 2016-01-16 MED ORDER — DEXAMETHASONE SODIUM PHOSPHATE 10 MG/ML IJ SOLN
INTRAMUSCULAR | Status: AC
  Filled 2016-01-16: qty 1

## 2016-01-16 MED ORDER — CEFAZOLIN SODIUM-DEXTROSE 2-3 GM-% IV SOLR
INTRAVENOUS | Status: AC
Start: 2016-01-16 — End: 2016-01-16
  Filled 2016-01-16: qty 50

## 2016-01-16 MED ORDER — LACTATED RINGERS IV SOLN: INTRAVENOUS | Status: DC

## 2016-01-16 MED ORDER — HYDROMORPHONE HCL 2 MG PO TABS
4.0000 mg | ORAL_TABLET | Freq: Four times a day (QID) | ORAL | Status: DC | PRN
  Administered 2016-01-16: 4 mg via ORAL
  Filled 2016-01-16: qty 2

## 2016-01-16 MED ORDER — LACTATED RINGERS IV SOLN
INTRAVENOUS | Status: DC
  Administered 2016-01-16: 1000 mL via INTRAVENOUS

## 2016-01-16 MED ORDER — MIDAZOLAM HCL 5 MG/5ML IJ SOLN
INTRAMUSCULAR | Status: DC | PRN
  Administered 2016-01-16: 2 mg via INTRAVENOUS

## 2016-01-16 MED ORDER — SODIUM CHLORIDE 0.9 % IR SOLN
Status: DC | PRN
  Administered 2016-01-16: 3000 mL

## 2016-01-16 MED ORDER — ONDANSETRON HCL 4 MG/2ML IJ SOLN: 4.0000 mg | Freq: Once | INTRAMUSCULAR | Status: DC | PRN

## 2016-01-16 MED ORDER — LIDOCAINE HCL (CARDIAC) 20 MG/ML IV SOLN
INTRAVENOUS | Status: AC
  Filled 2016-01-16: qty 5

## 2016-01-16 MED ORDER — NITROFURANTOIN MONOHYD MACRO 100 MG PO CAPS: 100.0000 mg | ORAL_CAPSULE | Freq: Every day | ORAL | Status: DC

## 2016-01-16 MED ORDER — PROPOFOL 10 MG/ML IV BOLUS
INTRAVENOUS | Status: DC | PRN
  Administered 2016-01-16: 200 mg via INTRAVENOUS

## 2016-01-16 MED ORDER — FENTANYL CITRATE (PF) 100 MCG/2ML IJ SOLN
50.0000 ug | INTRAMUSCULAR | Status: DC | PRN
  Administered 2016-01-16 (×2): 50 ug via INTRAVENOUS

## 2016-01-16 MED ORDER — ACETAMINOPHEN 10 MG/ML IV SOLN
INTRAVENOUS | Status: AC
  Filled 2016-01-16: qty 100

## 2016-01-16 SURGICAL SUPPLY — 27 items
BAG URO CATCHER STRL LF (MISCELLANEOUS) ×3 IMPLANT
BASKET STNLS GEMINI 4WIRE 3FR (BASKET) IMPLANT
BASKET ZERO TIP NITINOL 2.4FR (BASKET) IMPLANT
CATH INTERMIT  6FR 70CM (CATHETERS) ×3 IMPLANT
CATH URET 5FR 28IN CONE TIP (BALLOONS) ×1
CATH URET 5FR 70CM CONE TIP (BALLOONS) ×2 IMPLANT
CATH URET DUAL LUMEN 6-10FR 50 (CATHETERS) IMPLANT
CLOTH BEACON ORANGE TIMEOUT ST (SAFETY) ×3 IMPLANT
FIBER LASER FLEXIVA 1000 (UROLOGICAL SUPPLIES) IMPLANT
FIBER LASER FLEXIVA 200 (UROLOGICAL SUPPLIES) IMPLANT
FIBER LASER FLEXIVA 365 (UROLOGICAL SUPPLIES) IMPLANT
FIBER LASER FLEXIVA 550 (UROLOGICAL SUPPLIES) IMPLANT
FIBER LASER TRAC TIP (UROLOGICAL SUPPLIES) IMPLANT
GLOVE BIO SURGEON STRL SZ7.5 (GLOVE) ×3 IMPLANT
GLOVE SURG SS PI 6.5 STRL IVOR (GLOVE) ×3 IMPLANT
GOWN STRL REUS W/TWL XL LVL3 (GOWN DISPOSABLE) ×6 IMPLANT
GUIDEWIRE ANG ZIPWIRE 038X150 (WIRE) ×3 IMPLANT
GUIDEWIRE STR DUAL SENSOR (WIRE) ×3 IMPLANT
IV NS 1000ML (IV SOLUTION) ×3
IV NS 1000ML BAXH (IV SOLUTION) ×6 IMPLANT
MANIFOLD NEPTUNE II (INSTRUMENTS) ×3 IMPLANT
NS IRRIG 1000ML POUR BTL (IV SOLUTION) ×3 IMPLANT
PACK CYSTO (CUSTOM PROCEDURE TRAY) ×3 IMPLANT
SHEATH ACCESS URETERAL 38CM (SHEATH) ×3 IMPLANT
STENT CONTOUR 6FRX26X.038 (STENTS) ×3 IMPLANT
TUBING CONNECTING 10 (TUBING) ×3 IMPLANT
WIRE COONS/BENSON .038X145CM (WIRE) ×3 IMPLANT

## 2016-01-16 NOTE — Anesthesia Postprocedure Evaluation (Signed)
Anesthesia Post Note  Patient: Thomas Howell  Procedure(s) Performed: Procedure(s) (LRB): CYSTOSCOPY WITH LEFT RETROGRADE PYELOGRAM  LEFT DIGITAL URETEROSCOPY AND PLACEMENT LEFT URETERAL STENT (Left)  Patient location during evaluation: PACU Anesthesia Type: General Level of consciousness: awake and alert Pain management: pain level controlled Vital Signs Assessment: post-procedure vital signs reviewed and stable Respiratory status: spontaneous breathing, nonlabored ventilation, respiratory function stable and patient connected to nasal cannula oxygen Cardiovascular status: blood pressure returned to baseline and stable Postop Assessment: no signs of nausea or vomiting Anesthetic complications: no    Last Vitals:  Filed Vitals:   01/16/16 1439 01/16/16 1755  BP: 134/74 123/62  Pulse: 60 89  Temp: 36.3 C 36.8 C  Resp: 16 12    Last Pain:  Filed Vitals:   01/16/16 1757  PainSc: 6                  Keyna Blizard JENNETTE

## 2016-01-16 NOTE — Transfer of Care (Signed)
Immediate Anesthesia Transfer of Care Note  Patient: Thomas Howell  Procedure(s) Performed: Procedure(s): CYSTOSCOPY WITH LEFT RETROGRADE PYELOGRAM  LEFT DIGITAL URETEROSCOPY AND PLACEMENT LEFT URETERAL STENT (Left)  Patient Location: PACU  Anesthesia Type:General  Level of Consciousness:  sedated, patient cooperative and responds to stimulation  Airway & Oxygen Therapy:Patient Spontanous Breathing and Patient connected to face mask oxgen  Post-op Assessment:  Report given to PACU RN and Post -op Vital signs reviewed and stable  Post vital signs:  Reviewed and stable  Last Vitals:  Filed Vitals:   01/16/16 1439  BP: 134/74  Pulse: 60  Temp: 36.3 C  Resp: 16    Complications: No apparent anesthesia complications

## 2016-01-16 NOTE — Progress Notes (Signed)
Paged Dr Mena Goes for pain medications for patient.

## 2016-01-16 NOTE — Progress Notes (Addendum)
Called for pain medication by short stay nurse. Instructed nurse to send patient down to the holding area so he could be evaluated and given pain medication. Was told "this is what electronic medical records are for". I do not feel it is appropriate to give out narcotic pain medication to a presurgical patient that noone from anesthesia has ever seen or discussed care with. The patient also appears to have an allergic reaction to an opioid pain medication that I can not assess until I talk with the patient. I also do not believe he will be monitored appropriately on the transportation down. Therefore the safest and most appropriate action for this patient is to have him brought down to the holding area for evaluation and pain management. The nurse was not receptive to this idea and said she will call Dr. Mena Goes for pain medications. I have called upstairs and requested this patient be brought down.

## 2016-01-16 NOTE — Anesthesia Procedure Notes (Signed)
Procedure Name: LMA Insertion Date/Time: 01/16/2016 4:54 PM Performed by: Orest Dikes Pre-anesthesia Checklist: Patient identified, Emergency Drugs available, Suction available and Patient being monitored Patient Re-evaluated:Patient Re-evaluated prior to inductionOxygen Delivery Method: Circle system utilized Preoxygenation: Pre-oxygenation with 100% oxygen Intubation Type: IV induction LMA: LMA inserted LMA Size: 5.0 Number of attempts: 1 Placement Confirmation: positive ETCO2,  CO2 detector and breath sounds checked- equal and bilateral Tube secured with: Tape Dental Injury: Teeth and Oropharynx as per pre-operative assessment

## 2016-01-16 NOTE — Op Note (Signed)
Preoperative diagnosis left ureteral stone, left renal stone  Postoperative diagnosis: Left renal stones  Procedure: Cystoscopy, left retrograde pyelogram, left ureteroscopy left ureteral stent placement  Surgeon: Mena Goes  Anesthesia: Gen.  Indication for procedure: 25 year old with history of stones and a left 8 mm x 4 mm ureteral stone. Patient desired intervention today to prevent future episodes of emesis and uncontrolled pain as he cannot miss time from school. He kept himself nothing by mouth for today. He was brought to the OR.  Findings: On left retropyelogram this outlined a single ureter single collecting system unit with a slight narrowing in the proximal ureter but minimal dilation and hide. There was actually brisk drainage of the entire system.  Given the patient's symptoms I sent the ureteroscope up and a semirigid scope was able to pass the narrowed area and into the more proximal ureter which was mildly dilated but no stone noted. I could not get the flexible ureteroscope to pass this area in the proximal ureter. The ureters wanted to bunch up around the scope. This did not occur with the semirigid.  Description of procedure: After consent was obtained patient brought to the operating room. After adequate anesthesia he is placed in lithotomy position prepped and draped in the usual sterile fashion. A timeout was performed to confirm the patient and procedure. The cystoscope was passed per urethra and the urethra and bladder were unremarkable. I didn't appreciate a stone in the bladder. The left ureteral orifice was cannulated with a six-inch open-end catheter and retrograde injection of contrast was performed. There was brisk strain edge I didn't appreciate a stone on the scout or filling defect the given the patient's symptoms and the narrowing the proximal ureter all thought it best take a direct look. A sensor wire was advanced and coiled in the collecting system. A needlepoint  ureteroscope semirigid was advanced without difficulty well up into the proximal ureter. No stone was noted. I was not able to reach the renal pelvis. A second wire was placed under direct vision in the semirigid backed out. I then passed an access sheath over the sensor wire without difficulty but I could not get a flexible digital scope through the narrowing the proximal ureter. The ureter tried to bunch up around the scope. This was despite passing the sensor wire again and tried to go over the wire. I then switched to the single-channel more narrow digital and it would also not pass. Given that I darty been proximal to this area with the semirigid scope I decided to leave a stent. The access sheath and the ureteral access sheath were backed out and the ureter noted to be normal. As I back goes out I left a sensor wire in place. Passed a 6 [place] open-ended catheter back into the rare the renal pelvis and shot a retrograde on the way out again that outlined slight narrowing the proximal ureter but no obvious filling defects and again there is breast drainage. The Glidewire was backloaded on the cystoscope and a 626 and ureter stent placed. The wire was removed with good coil seen in the kidney and a good coil in the bladder.  I'll notify the patient and his mother the ureter was clear and gauge their interest in a second look ureteroscopy of the collecting system. Otherwise we could remove the stent.  Complications: None  Blood loss: Minimal  Specimens: None  Drains: 6 x 26 cm left ureteral stent  Disposition: Patient stable to PACU

## 2016-01-16 NOTE — H&P (Signed)
H&P  Chief Complaint: left prox ureteral stone  History of Present Illness: Pt with long h.o stones and a symptomatic left proximal ureteral stone. He is in school and cannot miss dur to pain, n/v. He stayed NPO today in hopes of a procedure. He's had no dysuria or gross hematuria.    Past Medical History  Diagnosis Date  . Migraine   . Complication of anesthesia   . PONV (postoperative nausea and vomiting)   . Left nephrolithiasis   . Wears glasses   . History of kidney stones   . Scoliosis of lumbar spine 1994    treated at [facility] until age 48   Past Surgical History  Procedure Laterality Date  . Foot capsule release w/ percutaneous heel cord lengthening, tibial tendon transfer Left 1994    clubfoot  . Lymph gland excision  2003    neck--  benign  . Cystoscopy with retrograde pyelogram, ureteroscopy and stent placement Left 06/24/2014    Procedure: CYSTOSCOPY WITH RETROGRADE PYELOGRAM,  AND STENT PLACEMENT;  Surgeon: Magdalene Molly, MD;  Location: [facility];  Service: Urology;  Laterality: Left;  . Cystoscopy with retrograde pyelogram, ureteroscopy and stent placement Left 07/05/2014    Procedure: CYSTO/LEFT URETEROSCOPY/LEFT RETROGRADE PYELOGRAM/LEFT STENT PLACEMENT;  Surgeon: Jerilee Field, MD;  Location: [facility];  Service: Urology;  Laterality: Left;  . Holmium laser application Left 07/05/2014    Procedure: LASER LITHO;  Surgeon: Jerilee Field, MD;  Location: [facility];  Service: Urology;  Laterality: Left;  . Cystoscopy with retrograde pyelogram, ureteroscopy and stent placement Left 07/06/2015    Procedure: CYSTOSCOPY WITH RETROGRADE PYELOGRAM, URETEROSCOPY , LASER, STENT PLACEMENT and BASKET EXTRACTION;  Surgeon: Jerilee Field, MD;  Location: [facility];  Service: Urology;  Laterality: Left;  . Cystoscopy w/ retrogrades Right 07/06/2015    Procedure: CYSTOSCOPY WITH RETROGRADE PYELOGRAM;  Surgeon: Jerilee Field, MD;  Location: [facility];   Service: Urology;  Laterality: Right;  . Extracorporeal shock wave lithotripsy Left 07-07-2015  &  12-26-2014  . Holmium laser application Left 08/08/2015    Procedure: HOLMIUM LASER  WITH LITHOTRIPSY ;  Surgeon: Jerilee Field, MD;  Location: [facility];  Service: Urology;  Laterality: Left;  . Cystoscopy/retrograde/ureteroscopy/stone extraction with basket Left 08/08/2015    Procedure: CYSTOSCOPY/RETROGRADE/URETEROSCOPY/STONE EXTRACTION WITH BASKET;  Surgeon: Jerilee Field, MD;  Location: [facility];  Service: Urology;  Laterality: Left;  . Cystoscopy w/ ureteral stent placement Left 08/08/2015    Procedure: CYSTOSCOPY WITH STENT REPLACEMENT;  Surgeon: Jerilee Field, MD;  Location: [facility];  Service: Urology;  Laterality: Left;  . Cystoscopy with retrograde pyelogram, ureteroscopy and stent placement Left 08/19/2015    Procedure: CYSTOSCOPY WITH RETROGRADE PYELOGRAM, URETEROSCOPY AND STENT PLACEMENT;  Surgeon: Jerilee Field, MD;  Location: [facility];  Service: Urology;  Laterality: Left;    Home Medications:  Prescriptions prior to admission  Medication Sig Dispense Refill Last Dose  . aspirin-acetaminophen-caffeine (EXCEDRIN MIGRAINE) 250-250-65 MG tablet Take 1 tablet by mouth every 6 (six) hours as needed for headache or migraine.   01/15/2016 at Unknown time  . meloxicam (MOBIC) 15 MG tablet Take 15 mg by mouth as needed for pain.   01/15/2016 at 1200  . naproxen sodium (ANAPROX) 220 MG tablet Take 400 mg by mouth every 12 (twelve) hours as needed (pain).   Past Week at Unknown time  . oxyCODONE-acetaminophen (PERCOCET/ROXICET) 5-325 MG tablet Take 1 tablet by mouth every 4 (four) hours as needed  for severe pain. 15 tablet 0 01/16/2016 at 1000  . promethazine (PHENERGAN) 25 MG tablet Take 1 tablet (25 mg total) by mouth every 6 (six) hours as needed for nausea or vomiting. 12 tablet 0 Past Week at Unknown time  . rizatriptan (MAXALT-MLT)  10 MG disintegrating tablet Take 1 tablet (10 mg total) by mouth as needed for migraine. May repeat in 2 hours if needed; max 2 per 24 hrs 10 tablet 2 Past Week at Unknown time  . tamsulosin (FLOMAX) 0.4 MG CAPS capsule Take 1 capsule (0.4 mg total) by mouth daily after supper. 30 capsule 0 Past Week at Unknown time  . topiramate (TOPAMAX) 100 MG tablet Take 1 tablet (100 mg total) by mouth at bedtime. 1/2 po qam and one po qhs (Patient taking differently: Take 50-100 mg by mouth daily. Take 50 mg in the am and Take 100 mg at bedtime) 45 tablet 2 01/15/2016 at 2200  . HYDROcodone-acetaminophen (NORCO/VICODIN) 5-325 MG tablet Take 1-2 tablets by mouth every 6 hours as needed for pain and/or cough. (Patient not taking: Reported on 01/09/2016) 7 tablet 0 Not Taking  . HYDROmorphone (DILAUDID) 4 MG tablet Take 1 tablet (4 mg total) by mouth every 6 (six) hours as needed for severe pain. (Patient not taking: Reported on 12/17/2015) 30 tablet 0 Completed Course at Unknown time   Allergies:  Allergies  Allergen Reactions  . Codeine Swelling    Lips swell//  This includes cough syrup w/ codeine  . Adhesive [Tape] Rash    Family History  Problem Relation Age of Onset  . Cancer Father   . Cancer Other   . Hypertension Other   . Hyperlipidemia Other   . Stroke Other   . Kidney Stones Mother   . Kidney Stones Brother    Social History:  reports that he has been passively smoking.  He has never used smokeless tobacco. He reports that he drinks alcohol. He reports that he does not use illicit drugs.  ROS: A complete review of systems was performed.  All systems are negative except for pertinent findings as noted. ROS   Physical Exam:  Vital signs in last 24 hours: Temp:  [97.4 F (36.3 C)] 97.4 F (36.3 C) (01/27 1439) Pulse Rate:  [60] 60 (01/27 1439) Resp:  [16] 16 (01/27 1439) BP: (134)/(74) 134/74 mmHg (01/27 1439) SpO2:  [100 %] 100 % (01/27 1439) Weight:  [91.173 kg (201 lb)] 91.173 kg  (201 lb) (01/27 1439) General:  Alert and oriented, No acute distress HEENT: Normocephalic, atraumatic Cardiovascular: Regular rate and rhythm Lungs: Regular rate and effort Abdomen: Soft, nontender, nondistended, no abdominal masses Back: No CVA tenderness Extremities: No edema Neurologic: Grossly intact  Laboratory Data:  No results found for this or any previous visit (from the past 24 hour(s)). No results found for this or any previous visit (from the past 240 hour(s)). Creatinine:  Recent Labs  01/15/16 0541  CREATININE 1.27*    Impression/Assessment:  Left ureteral stone, left LP stones  Plan:  I discussed with the patient the nature, potential benefits, risks and alternatives to left ureteral stent poss left URS/HLL, including side effects of the proposed treatment, the likelihood of the patient achieving the goals of the procedure, and any potential problems that might occur during the procedure or recuperation. All questions answered. Patient elects to proceed. Understands risks of staged procedure and focus on ureteral stone, not LP stones so as to limit # of procedures. Already discussed stopping Topamax  and need for 24hr urine.    Thomas Howell 01/16/2016, 4:59 PM

## 2016-01-16 NOTE — Anesthesia Preprocedure Evaluation (Signed)
Anesthesia Evaluation  Patient identified by MRN, date of birth, ID band Patient awake    Reviewed: Allergy & Precautions, NPO status , Patient's Chart, lab work & pertinent test results  History of Anesthesia Complications (+) PONV and history of anesthetic complications  Airway Mallampati: II  TM Distance: >3 FB Neck ROM: Full    Dental no notable dental hx. (+) Dental Advisory Given   Pulmonary neg pulmonary ROS,    Pulmonary exam normal breath sounds clear to auscultation       Cardiovascular negative cardio ROS Normal cardiovascular exam Rhythm:Regular Rate:Normal     Neuro/Psych  Headaches, negative psych ROS   GI/Hepatic negative GI ROS, Neg liver ROS,   Endo/Other  obesity  Renal/GU Renal disease  negative genitourinary   Musculoskeletal negative musculoskeletal ROS (+)   Abdominal   Peds negative pediatric ROS (+)  Hematology negative hematology ROS (+)   Anesthesia Other Findings   Reproductive/Obstetrics negative OB ROS                             Anesthesia Physical Anesthesia Plan  ASA: II  Anesthesia Plan: General   Post-op Pain Management:    Induction: Intravenous  Airway Management Planned: LMA  Additional Equipment:   Intra-op Plan:   Post-operative Plan: Extubation in OR  Informed Consent: I have reviewed the patients History and Physical, chart, labs and discussed the procedure including the risks, benefits and alternatives for the proposed anesthesia with the patient or authorized representative who has indicated his/her understanding and acceptance.   Dental advisory given  Plan Discussed with: CRNA  Anesthesia Plan Comments:         Anesthesia Quick Evaluation

## 2016-01-16 NOTE — Transfer of Care (Deleted)
Immediate Anesthesia Transfer of Care Note  Patient: Thomas Howell  Procedure(s) Performed: Procedure(s): CYSTOSCOPY WITH URETEROSCOPY AND STENT PLACEMENT (Left) HOLMIUM LASER APPLICATION (Left)  Patient Location: PACU  Anesthesia Type:General  Level of Consciousness:  sedated, patient cooperative and responds to stimulation  Airway & Oxygen Therapy:Patient Spontanous Breathing and Patient connected to face mask oxgen  Post-op Assessment:  Report given to PACU RN and Post -op Vital signs reviewed and stable  Post vital signs:  Reviewed and stable  Last Vitals:  Filed Vitals:   01/16/16 1439  BP: 134/74  Pulse: 60  Temp: 36.3 C  Resp: 16    Complications: No apparent anesthesia complications

## 2016-01-16 NOTE — Discharge Instructions (Signed)
General Anesthesia, Adult, Care After °Refer to this sheet in the next few weeks. These instructions provide you with information on caring for yourself after your procedure. Your health care provider may also give you more specific instructions. Your treatment has been planned according to current medical practices, but problems sometimes occur. Call your health care provider if you have any problems or questions after your procedure. °WHAT TO EXPECT AFTER THE PROCEDURE °After the procedure, it is typical to experience: °· Sleepiness. °· Nausea and vomiting. °HOME CARE INSTRUCTIONS °· For the first 24 hours after general anesthesia: °¨ Have a responsible person with you. °¨ Do not drive a car. If you are alone, do not take public transportation. °¨ Do not drink alcohol. °¨ Do not take medicine that has not been prescribed by your health care provider. °¨ Do not sign important papers or make important decisions. °¨ You may resume a normal diet and activities as directed by your health care provider. °· Change bandages (dressings) as directed. °· If you have questions or problems that seem related to general anesthesia, call the hospital and ask for the anesthetist or anesthesiologist on call. °SEEK MEDICAL CARE IF: °· You have nausea and vomiting that continue the day after anesthesia. °· You develop a rash. °SEEK IMMEDIATE MEDICAL CARE IF:  °· You have difficulty breathing. °· You have chest pain. °· You have any allergic problems. °  °This information is not intended to replace advice given to you by your health care provider. Make sure you discuss any questions you have with your health care provider. °  °Document Released: 03/14/2001 Document Revised: 12/27/2014 Document Reviewed: 04/05/2012 °Elsevier Interactive Patient Education ©2016 [employer] °Cystoscopy, Care After °Refer to this sheet in the next few weeks. These instructions provide you with information on caring for yourself after your procedure.  Your caregiver may also give you more specific instructions. Your treatment has been planned according to current medical practices, but problems sometimes occur. Call your caregiver if you have any problems or questions after your procedure. °HOME CARE INSTRUCTIONS  °Things you can do to ease any discomfort after your procedure include: °· Drinking enough water and fluids to keep your urine clear or pale yellow. °· Taking a warm bath to relieve any burning feelings. °SEEK IMMEDIATE MEDICAL CARE IF:  °· You have an increase in blood in your urine. °· You notice blood clots in your urine. °· You have difficulty passing urine. °· You have the chills. °· You have abdominal pain. °· You have a fever or persistent symptoms for more than 2-3 days. °· You have a fever and your symptoms suddenly get worse. °MAKE SURE YOU:  °· Understand these instructions. °· Will watch your condition. °· Will get help right away if you are not doing well or get worse. °  °This information is not intended to replace advice given to you by your health care provider. Make sure you discuss any questions you have with your health care provider. °  °Document Released: 06/25/2005 Document Revised: 12/27/2014 Document Reviewed: 05/29/2012 °Elsevier Interactive Patient Education ©2016 [employer] ° °

## 2016-01-16 NOTE — Progress Notes (Signed)
Met and discussed pain management with the patient. He has had lip and throat swelling with cough syrup that contained codeine as a child. Since then he has had multiple pain medications including dilaudid, fentanyl and percocet with no problems. He classifies his pain as a 7/10 in his left flank. I have ordered fentanyl and he is on a monitor with close observation. He is very pleased with his anesthetic plan and care.

## 2016-01-19 ENCOUNTER — Encounter (HOSPITAL_COMMUNITY): Payer: Self-pay | Admitting: Urology

## 2016-01-21 ENCOUNTER — Telehealth: Payer: Self-pay | Admitting: Family Medicine

## 2016-01-21 IMAGING — US US RENAL
1 series · 13 of 25 positions shown · non-contrast
Comparison: Multiple one-view abdomen x-rays, most recently
08/08/2015. CT abdomen and pelvis 06/26/2015.

CLINICAL DATA: 24-year-old with current history of urinary tract
calculi O underwent stone extraction from the left urinary tract on
08/08/2015. Double-J stent was removed on 08/14/2015. He presents
with 1 day history of left flank pain and nausea.

EXAM:
RENAL / URINARY TRACT ULTRASOUND COMPLETE

[Series 1: us renal · 0.21mm/px · 13 of 43 slices shown]
[im 1/43]
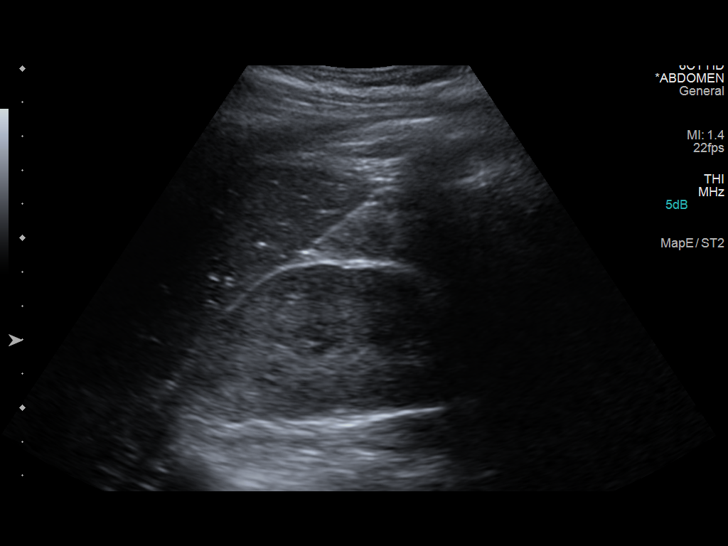
[im 4/43]
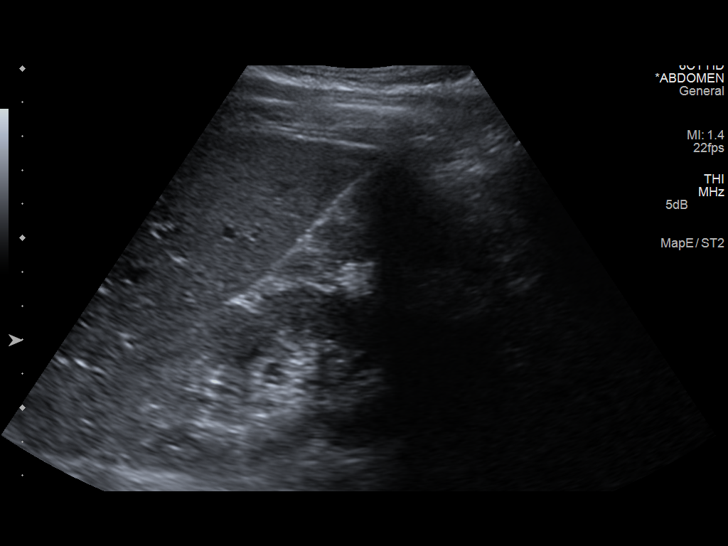
[im 8/43]
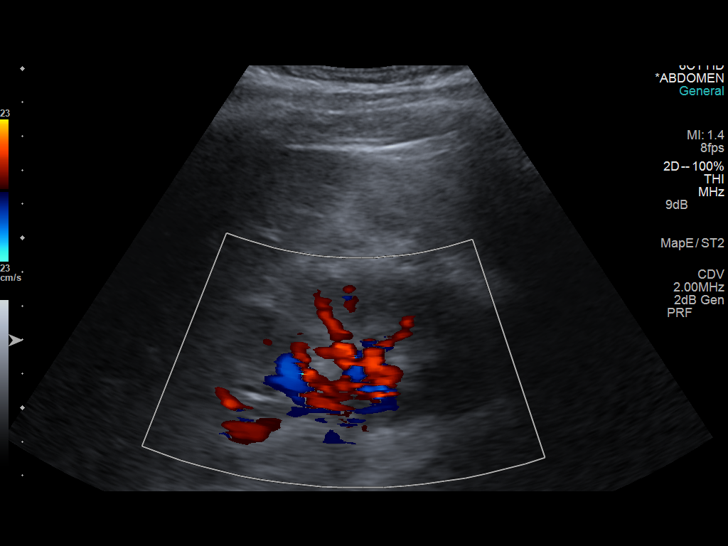
[im 11/43]
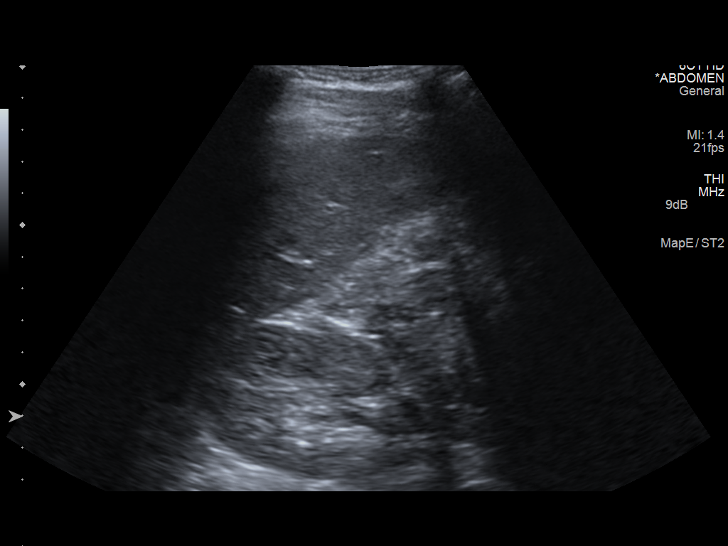
[im 15/43]
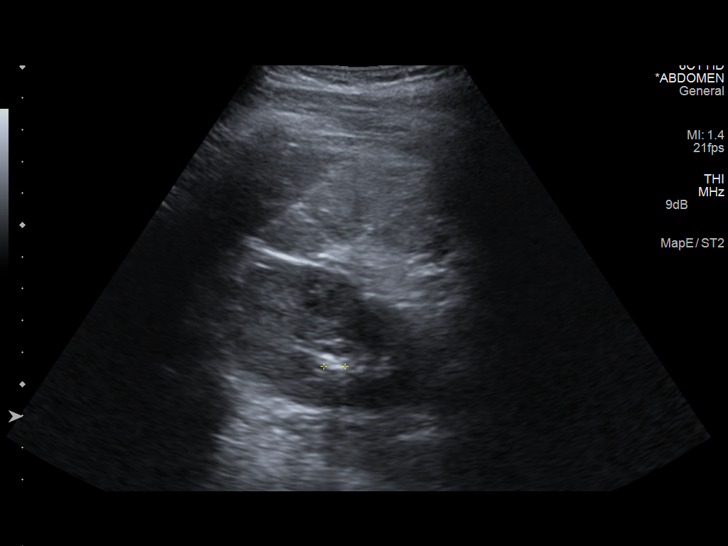
[im 18/43]
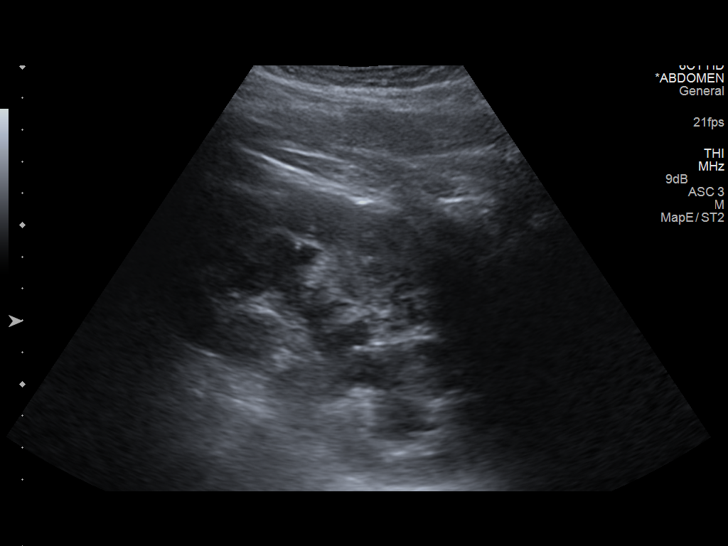
[im 22/43]
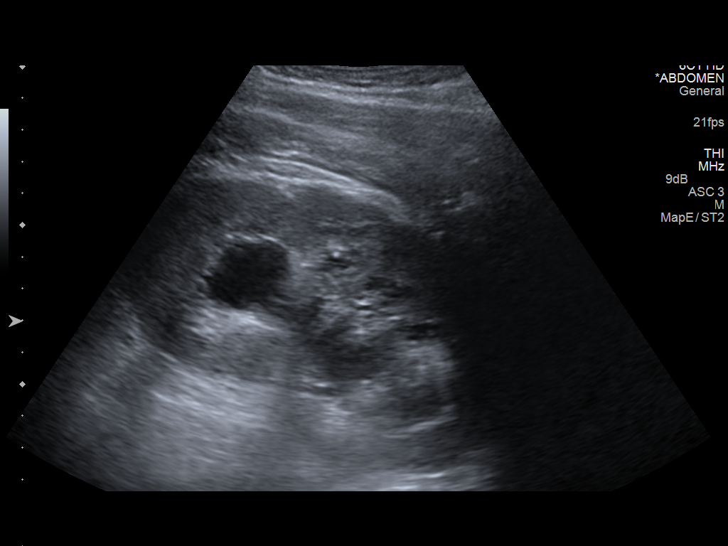
[im 25/43]
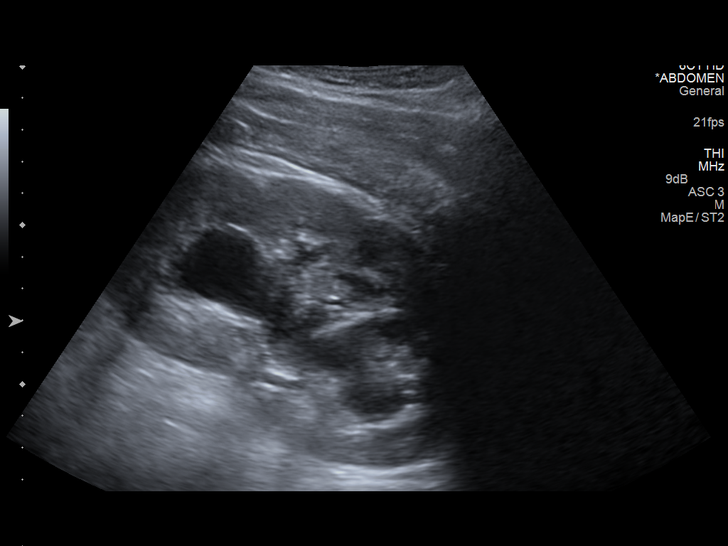
[im 29/43]
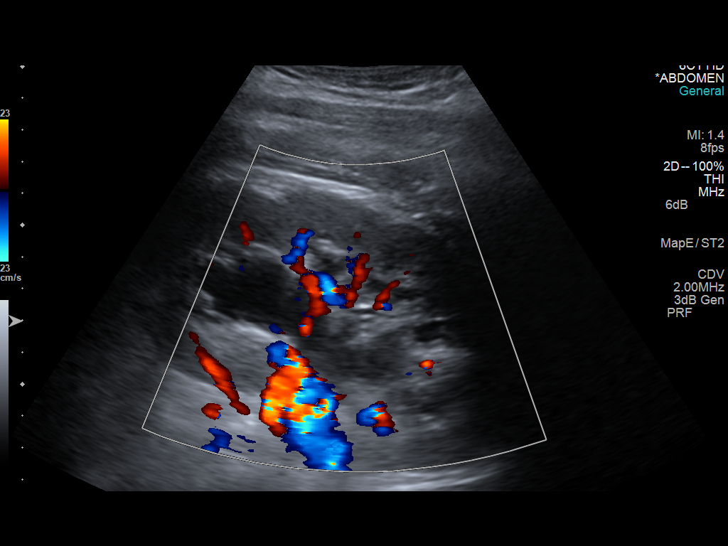
[im 32/43]
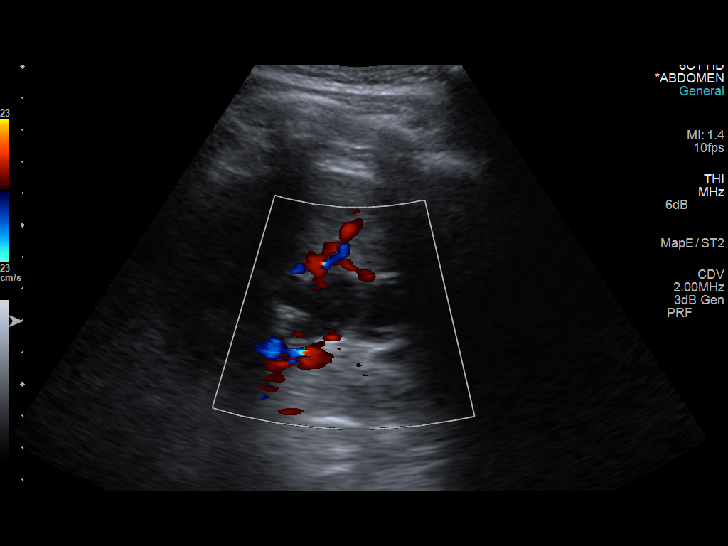
[im 36/43]
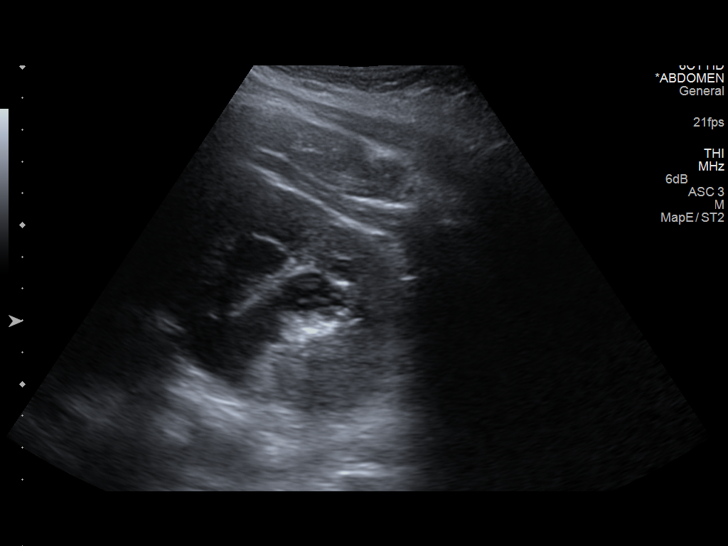
[im 39/43]
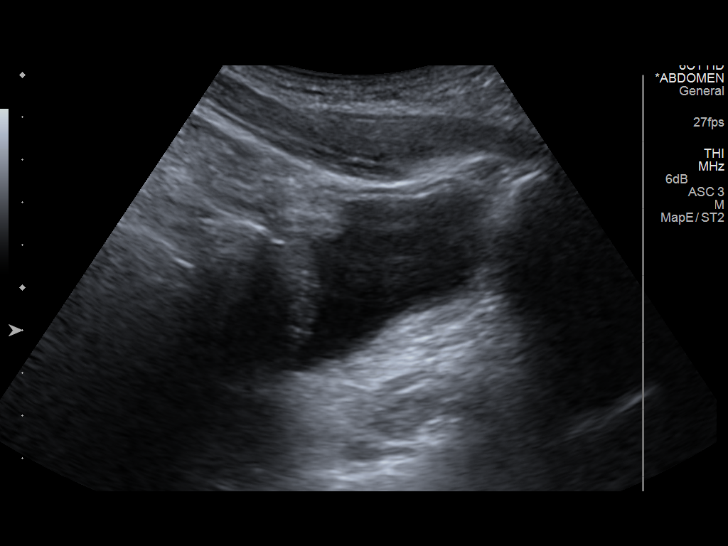
[im 43/43]
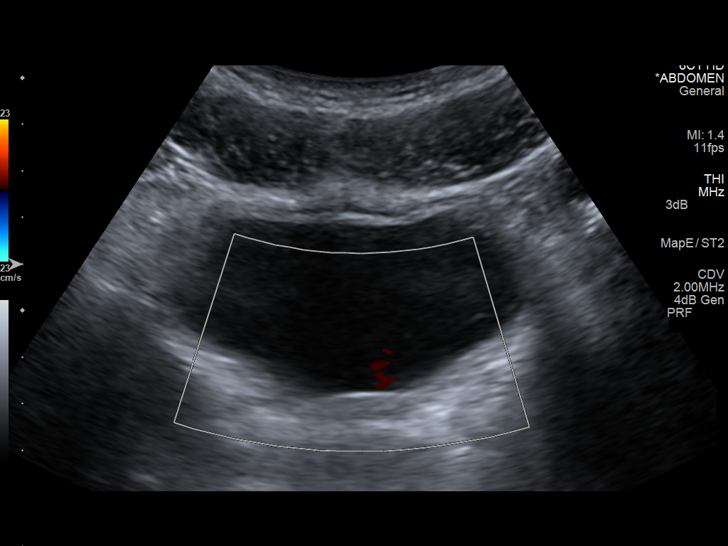

[13 of 25 positions shown; findings below may reference images not displayed]

FINDINGS: Right Kidney:

Length: Approximately 11.7 cm. No hydronephrosis. Well-preserved
cortex. Approximate 7 mm shadowing calculus in a lower pole calyx as
noted on prior CT. Normal parenchymal echotexture. No focal
parenchymal abnormality.

Left Kidney:

Length: Approximately 13.4 cm. Moderate to marked hydronephrosis.
Well-preserved cortex. Approximate 10 mm calculus (versus multiple
stone fragments) in a lower pole calix. Normal parenchymal
echotexture. No focal parenchymal abnormality.

Bladder:

Decompressed and unremarkable. Strong right ureteral jet and weak
left ureteral jet identified on color Doppler evaluation.
IMPRESSION: 1. Moderate to marked left hydronephrosis. A weak left ureteral jet
in the urinary bladder on Doppler evaluation indicates that if there
is obstruction, it is incomplete.
2. Bilateral renal calculi.

## 2016-01-21 NOTE — Telephone Encounter (Signed)
pts urologist thinks he needs to switch his Topamax to something else, he  Feels this is causing the patient to have his kidney stones. He just had to have  Emergency surgery (stints placed) from the stones.   He says he is willing to come in Friday to discuss this or you can change the Med an send it to [facility].   Please advise   ( he has tried nortriptyline an it did not work for him)

## 2016-01-21 NOTE — Telephone Encounter (Signed)
Needs ov

## 2016-01-23 ENCOUNTER — Encounter: Payer: Self-pay | Admitting: Family Medicine

## 2016-01-23 ENCOUNTER — Ambulatory Visit (INDEPENDENT_AMBULATORY_CARE_PROVIDER_SITE_OTHER): Payer: BLUE CROSS/BLUE SHIELD | Admitting: Family Medicine

## 2016-01-23 VITALS — BP 130/86 | Ht 69.0 in | Wt 203.1 lb

## 2016-01-23 DIAGNOSIS — G43009 Migraine without aura, not intractable, without status migrainosus: Secondary | ICD-10-CM

## 2016-01-23 MED ORDER — AMITRIPTYLINE HCL 25 MG PO TABS: ORAL_TABLET | ORAL | Status: DC

## 2016-01-23 NOTE — Progress Notes (Signed)
   Subjective:    Patient ID: Thomas Howell, male    DOB: Dec 31, 1990, 25 y.o.   MRN: [id number]  Migraine  This is a chronic problem. The current episode started more than 1 year ago.    Patient in today to discuss other medication options for migraine headaches. Patient's urologist believes that topamax was the cause of kidney stones.  Long-standing history of migraine headaches. Topamax it helped nicely. Unfortunately was advised by urologist to to recurrent kidney stones to get off Topamax. Of note Topamax can increase the risk of calcium phosphate stones. Patient has also experienced oxalate stones and likely uric acid stones  Frustrated by his chronic migraine status. Would like to have a neurologist discuss and assess with him  States no other concerns this visit.  Review of Systems No chest pain or abdominal pain    Objective:   Physical Exam  Alert vitals stable. HEENT normal. Lungs clear. Heart regular in rhythm.      Assessment & Plan:  Impression 1 recurrent migraine headaches with inability to take Topamax per his urologist plan stop Topamax. Initiate amitriptyline neurology referral per patient request

## 2016-02-04 ENCOUNTER — Ambulatory Visit (INDEPENDENT_AMBULATORY_CARE_PROVIDER_SITE_OTHER): Payer: BLUE CROSS/BLUE SHIELD | Admitting: Neurology

## 2016-02-04 ENCOUNTER — Encounter: Payer: Self-pay | Admitting: Neurology

## 2016-02-04 VITALS — BP 106/76 | HR 74 | Resp 14 | Ht 68.0 in | Wt 203.2 lb

## 2016-02-04 DIAGNOSIS — N200 Calculus of kidney: Secondary | ICD-10-CM | POA: Diagnosis not present

## 2016-02-04 DIAGNOSIS — G43009 Migraine without aura, not intractable, without status migrainosus: Secondary | ICD-10-CM

## 2016-02-04 DIAGNOSIS — M542 Cervicalgia: Secondary | ICD-10-CM

## 2016-02-04 MED ORDER — KETOROLAC TROMETHAMINE 60 MG/2ML IM SOLN: 60.0000 mg | Freq: Once | INTRAMUSCULAR | Status: DC

## 2016-02-04 MED ORDER — "SYRINGE/NEEDLE (DISP) 25G X 1"" 3 ML MISC": Status: DC

## 2016-02-04 MED ORDER — LEVETIRACETAM 500 MG PO TABS: ORAL_TABLET | ORAL | Status: DC

## 2016-02-04 NOTE — Progress Notes (Signed)
[facility]  PATIENT: Thomas Howell DOB: [birth year]  REFERRING DOCTOR OR PCP:  Ardyth Gal SOURCE: patient, records in EMR  _________________________________   HISTORICAL  CHIEF COMPLAINT:  Chief Complaint  Patient presents with  . Migraines    Thomas Howell is here for eval of h/a's.  Sts. has had 1-2 severe h/a's per week since age 25.  Previously treated by pcp Dr. Gerda Diss at [facility] for same.  Sts. has tried and failed Nortriptyline (ineffective), Topamax (kidney stones), and is currently taking Amitriptyline 75mg  qhs, but would like to stop--it makes him feel "out of it" all the time--leaves him fatigued.  Rescue med is Maxalt and he sts. this helps./fim    HISTORY OF PRESENT ILLNESS:  I had the pleasure of seeing [facility] for a neurologic consultation regarding his frequent migraine headaches.     He is a 25 yo man who has had frequent migraine headaches since age 25.     He currently is experiencing about 15 headaches a month lasting > 4 hours each day.   They have been at a frequency of at least 12 /month x many years.   On topiramate, the frequency dropped to 1/week but it caused him to have many more kidney stones.     A typical headache begins with pain starting in the occiput and radiating to the eye with a pounding quality.  He has no preceding aura.   He gets nausea and vomiting.   Moving increases the headache and laying in bed in a dark quiet room helps.    He has associated phonophobia and photophobia. Most headaches are random but some are triggered by strong smells such as perfume.  Maxalt helps most headaches .   He uses about 10/month and usually runs out by the end of the month.    He often takes in combination with Aleve.   Phenergan helps nausea    If he falls asleep, pain is better upon awakening.     In between headaches, he has some neck/occipital pain most days.  Currently, he has some  neck pain on the left.  He sleeps well most nights, even before being on amitriptyline.  Medications:   He has tries nortriptyline without benefit.   Amitriptyline has not helped and is poorly tolerated.    Topiramate helped but he had to stop due to multiple kidney stones (he now has a stent).  Aleve and other NSAID's by themself have not helped.     He has not been on Keppra, beta blocker or calcium channel blockers.   There are no imaging studies.    REVIEW OF SYSTEMS: Constitutional: No fevers, chills, sweats, or change in appetite Eyes: No visual changes, double vision, eye pain Ear, nose and throat: No hearing loss, ear pain, nasal congestion, sore throat Cardiovascular: No chest pain, palpitations Respiratory: No shortness of breath at rest or with exertion.   No wheezes GastrointestinaI: No nausea, vomiting, diarrhea, abdominal pain, fecal incontinence Genitourinary: No dysuria, urinary retention or frequency.  He has frequent kidney stones (sees Dr. Mena Goes at [facility]) Musculoskeletal: No neck pain, back pain Integumentary: No rash, pruritus, skin lesions Neurological: as above Psychiatric: No depression at this time.  No anxiety Endocrine: No palpitations, diaphoresis, change in appetite, change in weigh or increased thirst Hematologic/Lymphatic: No anemia, purpura, petechiae. Allergic/Immunologic: No itchy/runny eyes, nasal congestion, recent allergic reactions, rashes  ALLERGIES: Allergies  Allergen Reactions  . Codeine Swelling  Lips swell//  This includes cough syrup w/ codeine  . Adhesive [Tape] Rash    HOME MEDICATIONS:  Current outpatient prescriptions:  .  amitriptyline (ELAVIL) 25 MG tablet, Take 1 tablet by mouth at bedtime for 3 days, then take 2 tablets at bedtime for 3 days, then take 3 tablets at bedtime., Disp: 90 tablet, Rfl: 3 .  nitrofurantoin, macrocrystal-monohydrate, (MACROBID) 100 MG capsule, Take 1 capsule (100 mg total) by mouth at  bedtime., Disp: 21 capsule, Rfl: 0 .  promethazine (PHENERGAN) 25 MG tablet, Take 1 tablet (25 mg total) by mouth every 6 (six) hours as needed for nausea or vomiting., Disp: 12 tablet, Rfl: 0 .  rizatriptan (MAXALT-MLT) 10 MG disintegrating tablet, Take 1 tablet (10 mg total) by mouth as needed for migraine. May repeat in 2 hours if needed; max 2 per 24 hrs, Disp: 10 tablet, Rfl: 2 .  aspirin-acetaminophen-caffeine (EXCEDRIN MIGRAINE) 250-250-65 MG tablet, Take 1 tablet by mouth every 6 (six) hours as needed for headache or migraine. Reported on 02/04/2016, Disp: , Rfl:  .  meloxicam (MOBIC) 15 MG tablet, Take 15 mg by mouth as needed for pain. Reported on 02/04/2016, Disp: , Rfl:  .  naproxen sodium (ANAPROX) 220 MG tablet, Take 400 mg by mouth every 12 (twelve) hours as needed (pain). Reported on 02/04/2016, Disp: , Rfl:  .  tamsulosin (FLOMAX) 0.4 MG CAPS capsule, Take 1 capsule (0.4 mg total) by mouth daily after supper. (Patient not taking: Reported on 01/23/2016), Disp: 30 capsule, Rfl: 0 .  [DISCONTINUED] phentermine (ADIPEX-P) 37.5 MG tablet, Take 1 tablet (37.5 mg total) by mouth daily before breakfast., Disp: 30 tablet, Rfl: 2  PAST MEDICAL HISTORY: Past Medical History  Diagnosis Date  . Migraine   . Complication of anesthesia   . PONV (postoperative nausea and vomiting)   . Left nephrolithiasis   . Wears glasses   . History of kidney stones   . Scoliosis of lumbar spine 1994    treated at [facility] until age 62    PAST SURGICAL HISTORY: Past Surgical History  Procedure Laterality Date  . Foot capsule release w/ percutaneous heel cord lengthening, tibial tendon transfer Left 1994    clubfoot  . Lymph gland excision  2003    neck--  benign  . Cystoscopy with retrograde pyelogram, ureteroscopy and stent placement Left 06/24/2014    Procedure: CYSTOSCOPY WITH RETROGRADE PYELOGRAM,  AND STENT PLACEMENT;  Surgeon: Magdalene Molly, MD;  Location: [facility];  Service: Urology;  Laterality:  Left;  . Cystoscopy with retrograde pyelogram, ureteroscopy and stent placement Left 07/05/2014    Procedure: CYSTO/LEFT URETEROSCOPY/LEFT RETROGRADE PYELOGRAM/LEFT STENT PLACEMENT;  Surgeon: Jerilee Field, MD;  Location[facility];  Service: Urology;  Laterality: Left;  . Holmium laser application Left 07/05/2014    Procedure: LASER LITHO;  Surgeon: Jerilee Field, MD;  Location: [facility];  Service: Urology;  Laterality: Left;  . Cystoscopy with retrograde pyelogram, ureteroscopy and stent placement Left 07/06/2015    Procedure: CYSTOSCOPY WITH RETROGRADE PYELOGRAM, URETEROSCOPY , LASER, STENT PLACEMENT and BASKET EXTRACTION;  Surgeon: Jerilee Field, MD;  Location: [facility];  Service: Urology;  Laterality: Left;  . Cystoscopy w/ retrogrades Right 07/06/2015    Procedure: CYSTOSCOPY WITH RETROGRADE PYELOGRAM;  Surgeon: Jerilee Field, MD;  Location: [facility];  Service: Urology;  Laterality: Right;  . Extracorporeal shock wave lithotripsy Left 07-07-2015  &  12-26-2014  . Holmium laser application Left 08/08/2015    Procedure: HOLMIUM LASER  WITH LITHOTRIPSY ;  Surgeon: Jerilee Field, MD;  Location: [facility];  Service: Urology;  Laterality: Left;  . Cystoscopy/retrograde/ureteroscopy/stone extraction with basket Left 08/08/2015    Procedure: CYSTOSCOPY/RETROGRADE/URETEROSCOPY/STONE EXTRACTION WITH BASKET;  Surgeon: Jerilee Field, MD;  Location: [facility];  Service: Urology;  Laterality: Left;  . Cystoscopy w/ ureteral stent placement Left 08/08/2015    Procedure: CYSTOSCOPY WITH STENT REPLACEMENT;  Surgeon: Jerilee Field, MD;  Location[facility]  Service: Urology;  Laterality: Left;  . Cystoscopy with retrograde pyelogram, ureteroscopy and stent placement Left 08/19/2015    Procedure: CYSTOSCOPY WITH RETROGRADE PYELOGRAM, URETEROSCOPY AND STENT PLACEMENT;  Surgeon: Jerilee Field, MD;  Location: [facility];   Service: Urology;  Laterality: Left;  . Cystoscopy with ureteroscopy and stent placement Left 01/16/2016    Procedure: CYSTOSCOPY WITH LEFT RETROGRADE PYELOGRAM  LEFT DIGITAL URETEROSCOPY AND PLACEMENT LEFT URETERAL STENT;  Surgeon: Jerilee Field, MD;  Location: [facility];  Service: Urology;  Laterality: Left;    FAMILY HISTORY: Family History  Problem Relation Age of Onset  . Cancer Father   . Cancer Other   . Hypertension Other   . Hyperlipidemia Other   . Stroke Other   . Kidney Stones Mother   . Kidney Stones Brother     SOCIAL HISTORY:  Social History   Social History  . Marital Status: Single    Spouse Name: N/A  . Number of Children: N/A  . Years of Education: N/A   Occupational History  . Not on file.   Social History Main Topics  . Smoking status: Passive Smoke Exposure - Never Smoker -- 2 years  . Smokeless tobacco: Never Used  . Alcohol Use: 0.0 oz/week    0 Standard drinks or equivalent per week     Comment: occ  . Drug Use: No  . Sexual Activity: No   Other Topics Concern  . Not on file   Social History Narrative     PHYSICAL EXAM  Filed Vitals:   02/04/16 1521  BP: 106/76  Pulse: 74  Resp: 14  Height:  (1.727 m)  Weight: 203 lb 3.2 oz (92.171 kg)    Body mass index is 30.9 kg/(m^2).   General: The patient is well-developed and well-nourished and in no acute distress  Eyes:  Funduscopic exam shows normal optic discs and retinal vessels.  Neck: The neck is supple, no carotid bruits are noted.  The neck is tender over the left splenius capitis muscle/occipital nerve.  Cardiovascular: The heart has a regular rate and rhythm with a normal S1 and S2. There were no murmurs, gallops or rubs.    Skin: Extremities are without significant edema.  Musculoskeletal:  Back is nontender  Neurologic Exam  Mental status: The patient is alert and oriented x 3 at the time of the examination. The patient has apparent normal recent and remote  memory, with an apparently normal attention span and concentration ability.   Speech is normal.  Cranial nerves: Extraocular movements are full. Pupils are equal, round, and reactive to light and accomodation.  Visual fields are full.  Facial symmetry is present. There is good facial sensation to soft touch bilaterally.Facial strength is normal.  Trapezius and sternocleidomastoid strength is normal. No dysarthria is noted.  The tongue is midline, and the patient has symmetric elevation of the soft palate. No obvious hearing deficits are noted.  Motor:  Muscle bulk is normal.   Tone is normal. Strength is  5 /  5 in all 4 extremities.   Sensory: Sensory testing is intact to pinprick, soft touch and vibration sensation in all 4 extremities.  Coordination: Cerebellar testing reveals good finger-nose-finger and heel-to-shin bilaterally.  Gait and station: Station is normal.   Gait is normal. Tandem gait is normal. Romberg is negative.   Reflexes: Deep tendon reflexes are symmetric and normal bilaterally.        DIAGNOSTIC DATA (LABS, IMAGING, TESTING) - I reviewed patient records, labs, notes, testing and imaging myself where available.  Lab Results  Component Value Date   WBC 6.8 01/15/2016   HGB 15.5 01/15/2016   HCT 44.9 01/15/2016   MCV 88.7 01/15/2016   PLT 188 01/15/2016      Component Value Date/Time   NA 141 01/15/2016 0541   K 4.2 01/15/2016 0541   CL 109 01/15/2016 0541   CO2 24 01/15/2016 0541   GLUCOSE 112* 01/15/2016 0541   BUN 16 01/15/2016 0541   CREATININE 1.27* 01/15/2016 0541   CALCIUM 9.5 01/15/2016 0541   PROT 7.9 07/14/2015 2215   ALBUMIN 4.5 07/14/2015 2215   AST 18 07/14/2015 2215   ALT 12* 07/14/2015 2215   ALKPHOS 53 07/14/2015 2215   BILITOT 0.4 07/14/2015 2215   GFRNONAA >60 01/15/2016 0541   GFRAA >60 01/15/2016 0541       ASSESSMENT AND PLAN  Migraine without aura and without status migrainosus, not intractable  Neck pain  Kidney  stone   In summary, Mr. Guerrant is a 25 year old man with a nine-year history of migraine headaches occurring about 15 days a month.    We discussed different prophylactic medications for the migraines. He has not responded well to tricyclics cannot tolerate topiramate or zonisamide due to his kidney history. He has not yet tried levetiracetam and I will place him on a dose of 500 mg in the morning and 1000 mg at night and increase this further if he gets a partial benefit. He will stop amitriptyline by tapering over the next week. I would also consider Depakote if levetiracetam does not help. To help abort the headaches if he runs out of Maxalt, I wrote for Toradol that he can self administer as a 60 mg shot.   We also discussed that if he not respond to couple more prophylactic medications that I would consider Botox or investigational therapies. If the frequency increases further or if the intensity changes, we'll consider imaging studies.  He will return to see me in 8 weeks or sooner if there are new or worsening neurologic symptoms.     Richard A. Epimenio Foot, MD, PhD 02/04/2016, 3:26 PM Certified in Neurology, Clinical Neurophysiology, Sleep Medicine, Pain Medicine and Neuroimaging  [facility] [address] [place], [place] [address] [phone]

## 2016-02-04 NOTE — Patient Instructions (Signed)
1.   Use either Maxalt or Toradol to treat migraine 2.   Keppra 500 mg in morning and 1000 mg at night 3.   Stop amitriptyline

## 2016-02-06 ENCOUNTER — Other Ambulatory Visit: Payer: Self-pay | Admitting: Urology

## 2016-02-09 ENCOUNTER — Encounter (HOSPITAL_COMMUNITY): Payer: Self-pay | Admitting: *Deleted

## 2016-02-12 NOTE — H&P (Signed)
History of Present Illness         F/u - PCP Dr. Gerda Diss      1- nephrolithiasis - July 2015 - staged left ureteroscopy. I did the procedure. This revealed a multichannel left lower pole diverticulum with multiple stones. I tried to open up and empty as many stones as I could. Stones are carbonate apatite 90%, calcium oxalate 10%  -Jan 2016 Left LP ESWL  -Jul - Aug 2016 - staged LEFT URS/ESWL/URS for a proximal ureteral stone and LLP stone (difficult anatomy). Pt developed recurrent pain and hydro on U/S. I took him for a third URS and cleared some fragments. Concern for sx at the proximal stone impaction site. Stent removed Sept 2016     His stones have been all types of Ca. Prior PTH, Ca, elytes and Uric acid have been normal.     Feb 2017 int hx  Patient follows up from ureteroscopy last month. He had some left flank pain and a a CT scan showed a 4 x 8 mm left proximal stone. I took him to the operating room where a left retrograde was actually quite normal with brisk drainage. Because of all his symptoms attempted direct look with the semirigid ureteroscope into the proximal ureter which was normal, but I could not get a flexible ureteroscope passed a narrowed area in the proximal ureter into the collecting system. I left a stent and he presents today for stent removal.    Today, patient is doing well. We had talked about removing the stent today versus going back up with ureteroscopy to clear some of the lower pole stones. Initially, we were going to remove the stent, so I prepped him with ketorolac and the Cipro. However, patient thought about it and wanted to proceed with a staged ureteroscopy.    He was okay with the Toradol anyway Casillas had some typical stent pain. He has tapered off the Topamax.   Past Medical History Problems  1. History of migraine headaches (Z86.69)  Surgical History Problems  1. History of Cystoscopy With Insertion Of Ureteral Stent  Left 2. History of Cystoscopy With Insertion Of Ureteral Stent Left 3. History of Cystoscopy With Insertion Of Ureteral Stent Left 4. History of Cystoscopy With Insertion Of Ureteral Stent Left 5. History of Cystoscopy With Insertion Of Ureteral Stent Left 6. History of Cystoscopy With Insertion Of Ureteral Stent Left 7. History of Cystoscopy With Ureteroscopy Left 8. History of Cystoscopy With Ureteroscopy Left 9. History of Cystoscopy With Ureteroscopy With Lithotripsy 10. History of Cystoscopy With Ureteroscopy With Lithotripsy 11. History of Cystoscopy With Ureteroscopy With Lithotripsy 12. History of Foot Surgery 13. History of Lymphadenectomy 14. History of Renal Lithotripsy 15. History of Renal Lithotripsy  Current Meds 1. Aleve TABS;  Therapy: (Recorded:05Feb2016) to Recorded 2. Keppra TABS;  Therapy: (Recorded:17Feb2017) to Recorded 3. Meloxicam 15 MG Oral Tablet; TAKE 1 TABLET DAILY WITH FOOD;  Therapy: 02Aug2016 to (Evaluate:03Feb2017); Last Rx:04Jan2017 Ordered 4. Phenergan TABS;  Therapy: (Recorded:06Jul2015) to Recorded  Allergies Medication  1. Codeine Derivatives 2. Hydrocodone-Acetaminophen CAPS Non-Medication  3. Adhesive Tape  Family History Problems  1. Family history of Blood In Urine : Mother 2. Family history of Blood In Urine : Brother 3. Family history of Family Health Status Number Of Children   1 brother 4. Family history of Nephrolithiasis : Mother, Brother  Social History Problems  1. Alcohol Use (History)   social 2. Caffeine Use   3 glasses a day 3. Current smoker  on some days (F17.200)   social 4. Marital History - Single 5. Occupation:   Consulting civil engineer 6. Denied: History of Tobacco Use  Vitals Vital Signs [Data Includes: Last 1 Day]  Recorded: 17Feb2017 02:01PM  Blood Pressure: 126 / 85 Temperature: 98.1 F Heart Rate: 86  Physical Exam Constitutional: Well nourished and well developed . No acute distress.  Neuro/Psych:.  Mood and affect are appropriate.    Procedure patient given Toradol 60 mg IM     Assessment Assessed  1. Hydronephrosis, left (N13.30) 2. Nephrolithiasis (N20.0)  Plan Hydronephrosis, left  1. Administered: Ketorolac Tromethamine 60 MG/2ML Injection Solution 2. Cysto Removal JJ(s); Status:Canceled - Appointment,Date of Service;  Nephrolithiasis  3. Follow-up Schedule Surgery Office  Follow-up  Status: Hold For - Appointment   Requested for: 17Feb2017  Discussion/Summary     Nephrolithiasis - as pt already has stent he wanted to go ahead and proceed with a staged ureteroscopy. We went over again the nature risks benefits and alternatives to ureteroscopy. We'll set this up for next week.   Left ureteral stone-resolved.     Signatures Electronically signed by : Jerilee Field, M.D.; Feb 06 2016  2:36PM EST  Add: His urine cx was negative.

## 2016-02-13 ENCOUNTER — Encounter (HOSPITAL_COMMUNITY): Payer: Self-pay | Admitting: *Deleted

## 2016-02-13 ENCOUNTER — Ambulatory Visit (HOSPITAL_COMMUNITY): Payer: BLUE CROSS/BLUE SHIELD | Admitting: Anesthesiology

## 2016-02-13 ENCOUNTER — Ambulatory Visit (HOSPITAL_COMMUNITY)
Admission: RE | Admit: 2016-02-13 | Discharge: 2016-02-13 | Disposition: A | Discharge status: Home / Self Care | Payer: BLUE CROSS/BLUE SHIELD | Source: Ambulatory Visit | Attending: Urology | Admitting: Urology

## 2016-02-13 ENCOUNTER — Encounter (HOSPITAL_COMMUNITY)
Admission: RE | Disposition: A | Discharge status: Home / Self Care | Payer: Self-pay | Source: Ambulatory Visit | Attending: Urology

## 2016-02-13 DIAGNOSIS — Z791 Long term (current) use of non-steroidal anti-inflammatories (NSAID): Secondary | ICD-10-CM | POA: Diagnosis not present

## 2016-02-13 DIAGNOSIS — N200 Calculus of kidney: Secondary | ICD-10-CM

## 2016-02-13 DIAGNOSIS — G43909 Migraine, unspecified, not intractable, without status migrainosus: Secondary | ICD-10-CM | POA: Insufficient documentation

## 2016-02-13 DIAGNOSIS — F17200 Nicotine dependence, unspecified, uncomplicated: Secondary | ICD-10-CM | POA: Insufficient documentation

## 2016-02-13 DIAGNOSIS — Z79899 Other long term (current) drug therapy: Secondary | ICD-10-CM | POA: Insufficient documentation

## 2016-02-13 DIAGNOSIS — Z6830 Body mass index (BMI) 30.0-30.9, adult: Secondary | ICD-10-CM | POA: Diagnosis not present

## 2016-02-13 DIAGNOSIS — E669 Obesity, unspecified: Secondary | ICD-10-CM | POA: Diagnosis not present

## 2016-02-13 DIAGNOSIS — Z841 Family history of disorders of kidney and ureter: Secondary | ICD-10-CM | POA: Insufficient documentation

## 2016-02-13 DIAGNOSIS — N132 Hydronephrosis with renal and ureteral calculous obstruction: Secondary | ICD-10-CM | POA: Diagnosis present

## 2016-02-13 HISTORY — PX: CYSTOSCOPY WITH URETEROSCOPY, STONE BASKETRY AND STENT PLACEMENT: SHX6378

## 2016-02-13 SURGERY — CYSTOSCOPY, WITH CALCULUS MANIPULATION OR REMOVAL
Anesthesia: General | Laterality: Left

## 2016-02-13 MED ORDER — LIDOCAINE HCL (CARDIAC) 20 MG/ML IV SOLN
INTRAVENOUS | Status: AC
  Filled 2016-02-13: qty 5

## 2016-02-13 MED ORDER — CEPHALEXIN 500 MG PO CAPS: 500.0000 mg | ORAL_CAPSULE | Freq: Every day | ORAL | Status: DC

## 2016-02-13 MED ORDER — MIDAZOLAM HCL 2 MG/2ML IJ SOLN
INTRAMUSCULAR | Status: AC
  Filled 2016-02-13: qty 2

## 2016-02-13 MED ORDER — HYDROMORPHONE HCL 4 MG PO TABS: 4.0000 mg | ORAL_TABLET | Freq: Four times a day (QID) | ORAL | Status: DC | PRN

## 2016-02-13 MED ORDER — SODIUM CHLORIDE 0.9 % IJ SOLN
INTRAMUSCULAR | Status: AC
  Filled 2016-02-13: qty 10

## 2016-02-13 MED ORDER — LIDOCAINE HCL 2 % EX GEL
CUTANEOUS | Status: DC | PRN
Start: 2016-02-13 — End: 2016-02-13
  Administered 2016-02-13: 1 via URETHRAL

## 2016-02-13 MED ORDER — LACTATED RINGERS IV SOLN
INTRAVENOUS | Status: DC | PRN
  Administered 2016-02-13 (×2): via INTRAVENOUS

## 2016-02-13 MED ORDER — PROPOFOL 10 MG/ML IV BOLUS
INTRAVENOUS | Status: AC
  Filled 2016-02-13: qty 20

## 2016-02-13 MED ORDER — ONDANSETRON HCL 4 MG/2ML IJ SOLN
INTRAMUSCULAR | Status: AC
  Filled 2016-02-13: qty 2

## 2016-02-13 MED ORDER — BELLADONNA ALKALOIDS-OPIUM 16.2-60 MG RE SUPP
RECTAL | Status: AC
  Filled 2016-02-13: qty 1

## 2016-02-13 MED ORDER — CEFAZOLIN SODIUM-DEXTROSE 2-3 GM-% IV SOLR
INTRAVENOUS | Status: AC
  Filled 2016-02-13: qty 50

## 2016-02-13 MED ORDER — DEXAMETHASONE SODIUM PHOSPHATE 4 MG/ML IJ SOLN
INTRAMUSCULAR | Status: DC | PRN
  Administered 2016-02-13: 10 mg via INTRAVENOUS

## 2016-02-13 MED ORDER — FENTANYL CITRATE (PF) 100 MCG/2ML IJ SOLN
INTRAMUSCULAR | Status: AC
  Filled 2016-02-13: qty 2

## 2016-02-13 MED ORDER — PROPOFOL 10 MG/ML IV BOLUS
INTRAVENOUS | Status: DC | PRN
  Administered 2016-02-13: 200 mg via INTRAVENOUS

## 2016-02-13 MED ORDER — SODIUM CHLORIDE 0.9 % IR SOLN
Status: DC | PRN
  Administered 2016-02-13: 2000 mL

## 2016-02-13 MED ORDER — DEXAMETHASONE SODIUM PHOSPHATE 10 MG/ML IJ SOLN
INTRAMUSCULAR | Status: AC
  Filled 2016-02-13: qty 1

## 2016-02-13 MED ORDER — FENTANYL CITRATE (PF) 100 MCG/2ML IJ SOLN
INTRAMUSCULAR | Status: DC | PRN
  Administered 2016-02-13: 25 ug via INTRAVENOUS
  Administered 2016-02-13 (×2): 50 ug via INTRAVENOUS
  Administered 2016-02-13: 25 ug via INTRAVENOUS
  Administered 2016-02-13 (×2): 50 ug via INTRAVENOUS

## 2016-02-13 MED ORDER — LIDOCAINE HCL (CARDIAC) 20 MG/ML IV SOLN
INTRAVENOUS | Status: DC | PRN
  Administered 2016-02-13: 50 mg via INTRAVENOUS

## 2016-02-13 MED ORDER — FENTANYL CITRATE (PF) 250 MCG/5ML IJ SOLN
INTRAMUSCULAR | Status: AC
  Filled 2016-02-13: qty 5

## 2016-02-13 MED ORDER — ONDANSETRON HCL 4 MG/2ML IJ SOLN
INTRAMUSCULAR | Status: DC | PRN
  Administered 2016-02-13: 4 mg via INTRAVENOUS

## 2016-02-13 MED ORDER — MIDAZOLAM HCL 5 MG/5ML IJ SOLN
INTRAMUSCULAR | Status: DC | PRN
  Administered 2016-02-13 (×2): 1 mg via INTRAVENOUS

## 2016-02-13 MED ORDER — EPHEDRINE SULFATE 50 MG/ML IJ SOLN
INTRAMUSCULAR | Status: AC
  Filled 2016-02-13: qty 1

## 2016-02-13 MED ORDER — PROMETHAZINE HCL 25 MG/ML IJ SOLN: 6.2500 mg | INTRAMUSCULAR | Status: DC | PRN

## 2016-02-13 MED ORDER — FENTANYL CITRATE (PF) 100 MCG/2ML IJ SOLN
25.0000 ug | INTRAMUSCULAR | Status: DC | PRN
  Administered 2016-02-13 (×3): 50 ug via INTRAVENOUS

## 2016-02-13 MED ORDER — CEFAZOLIN SODIUM-DEXTROSE 2-3 GM-% IV SOLR
2.0000 g | INTRAVENOUS | Status: AC
  Administered 2016-02-13: 2 g via INTRAVENOUS

## 2016-02-13 MED ORDER — LIDOCAINE HCL 2 % EX GEL
CUTANEOUS | Status: AC
  Filled 2016-02-13: qty 5

## 2016-02-13 SURGICAL SUPPLY — 19 items
BAG URO CATCHER STRL LF (MISCELLANEOUS) ×2 IMPLANT
BASKET LASER NITINOL 1.9FR (BASKET) ×2 IMPLANT
BASKET ZERO TIP NITINOL 2.4FR (BASKET) ×2 IMPLANT
CATH URET 5FR 28IN CONE TIP (BALLOONS)
CATH URET 5FR 70CM CONE TIP (BALLOONS) IMPLANT
CLOTH BEACON ORANGE TIMEOUT ST (SAFETY) ×2 IMPLANT
FIBER LASER TRAC TIP (UROLOGICAL SUPPLIES) ×2 IMPLANT
GLOVE BIO SURGEON STRL SZ7.5 (GLOVE) ×2 IMPLANT
GOWN STRL REUS W/TWL XL LVL3 (GOWN DISPOSABLE) ×2 IMPLANT
GUIDEWIRE STR DUAL SENSOR (WIRE) ×2 IMPLANT
MANIFOLD NEPTUNE II (INSTRUMENTS) ×2 IMPLANT
PACK CYSTO (CUSTOM PROCEDURE TRAY) ×2 IMPLANT
SHEATH ACCESS URETERAL 24CM (SHEATH) IMPLANT
SHEATH ACCESS URETERAL 38CM (SHEATH) ×2 IMPLANT
SHEATH ACCESS URETERAL 54CM (SHEATH) ×2 IMPLANT
STENT CONTOUR 6FRX26X.038 (STENTS) ×2 IMPLANT
STENT CONTOUR URETERAL (STENTS) IMPLANT
TUBING CONNECTING 10 (TUBING) ×2 IMPLANT
WIRE COONS/BENSON .038X145CM (WIRE) ×2 IMPLANT

## 2016-02-13 NOTE — Discharge Instructions (Signed)
Ureteral Stent Implantation, Care After °Refer to this sheet in the next few weeks. These instructions provide you with information on caring for yourself after your procedure. Your health care provider may also give you more specific instructions. Your treatment has been planned according to current medical practices, but problems sometimes occur. Call your health care provider if you have any problems or questions after your procedure. °WHAT TO EXPECT AFTER THE PROCEDURE °You should be back to normal activity within 48 hours after the procedure. Nausea and vomiting may occur and are commonly the result of anesthesia. °It is common to experience sharp pain in the back or lower abdomen and penis with voiding. This is caused by movement of the ends of the stent with the act of urinating. It usually goes away within minutes after you have stopped urinating. °HOME CARE INSTRUCTIONS °Make sure to drink plenty of fluids. You may have small amounts of bleeding, causing your urine to be red. This is normal. Certain movements may trigger pain or a feeling that you need to urinate. You may be given medicines to prevent infection or bladder spasms. Be sure to take all medicines as directed. Only take over-the-counter or prescription medicines for pain, discomfort, or fever as directed by your health care provider. Do not take aspirin, as this can make bleeding worse. °Your stent will be left in until the blockage is resolved. This may take 2 weeks or longer, depending on the reason for stent implantation. You may have an X-ray exam to make sure your ureter is open and that the stent has not moved out of position (migrated). The stent can be removed by your health care provider in the office. Medicines may be given for comfort while the stent is being removed. Be sure to keep all follow-up appointments so your health care provider can check that you are healing properly. °SEEK MEDICAL CARE IF: °· You experience increasing  pain. °· Your pain medicine is not working. °SEEK IMMEDIATE MEDICAL CARE IF: °· Your urine is dark red or has blood clots. °· You are leaking urine (incontinent). °· You have a fever, chills, feeling sick to your stomach (nausea), or vomiting. °· Your pain is not relieved by pain medicine. °· The end of the stent comes out of the urethra. °· You are unable to urinate. °  °This information is not intended to replace advice given to you by your health care provider. Make sure you discuss any questions you have with your health care provider. °  °Document Released: 08/08/2013 Document Revised: 12/11/2013 Document Reviewed: 06/20/2015 °Elsevier Interactive Patient Education ©2016 [employer] ° °

## 2016-02-13 NOTE — Interval H&P Note (Signed)
History and Physical Interval Note:  02/13/2016 7:25 AM  Thomas Howell  has presented today for surgery, with the diagnosis of LEFT NEPHROLITHIASIS  The various methods of treatment have been discussed with the patient and family. After consideration of risks, benefits and other options for treatment, the patient has consented to  Procedure(s): CYSTOSCOPY WITH LEFT URETEROSCOPY, HOLMIUM LASER AND STENT PLACEMENT (Left) as a surgical intervention .  The patient's history has been reviewed, patient examined, no change in status, stable for surgery.  I have reviewed the patient's chart and labs.  Questions were answered to the patient's satisfaction.  Pt requested no string on stent and dilaudid for pain. Discussed will try and clear LLP but might not be able to get all clear given anatomy. He has been well. No fever or dysuria.    Sollie Vultaggio

## 2016-02-13 NOTE — Addendum Note (Signed)
Addendum  created 02/13/16 1103 by Ludwig Lean, CRNA   Modules edited: Anesthesia Medication Administration

## 2016-02-13 NOTE — Anesthesia Postprocedure Evaluation (Signed)
Anesthesia Post Note  Patient: [name]  Procedure(s) Performed: Procedure(s) (LRB): CYSTOSCOPY WITH LEFT URETEROSCOPY, HOLMIUM LASER AND STENT PLACEMENT (Left)  Patient location during evaluation: PACU Anesthesia Type: General Level of consciousness: awake and alert Pain management: pain level controlled Vital Signs Assessment: post-procedure vital signs reviewed and stable Respiratory status: spontaneous breathing, nonlabored ventilation, respiratory function stable and patient connected to nasal cannula oxygen Cardiovascular status: blood pressure returned to baseline and stable Postop Assessment: no signs of nausea or vomiting Anesthetic complications: no    Last Vitals:  Filed Vitals:   02/13/16 0945 02/13/16 1000  BP: 128/83 115/68  Pulse: 63 65  Temp: 36.8 C 36.9 C  Resp: 12 12    Last Pain:  Filed Vitals:   02/13/16 1018  PainSc: 6                  Thomas Howell

## 2016-02-13 NOTE — Progress Notes (Signed)
Patient stated pain 6/10 post op. Had IV Fentanyl IV in PACU. Called MD regarding po pain med for patient in short stay. Patient up to restroom with RN and voided. Tolerated well. Patient declined to take pain med po here in short stay. Stated he was comfortable enough to go home with mom and take pain med at home. Instructions reviewed with patient and mom. Prescription given to mom. Patient home at 1050.

## 2016-02-13 NOTE — Op Note (Signed)
Preoperative diagnosis: Left lower pole renal stones Postoperative diagnosis: Same  Procedure: Cystoscopy with left ureteroscopy holmium laser lithotripsy, stone basket extraction and stent placement  Surgeon: Mena Goes  Anesthesia: Gen.  Findings: Ureter with small stone which was removed might have been stone from last time. Narrowed area a proximal ureter accommodated semirigid scope and the single-channel digital ureteroscope. Stones were located in the left lower pole. One stone in the lower pole and then the ureteroscope had to be rotated medially which is able to guide the scope into the calyx with the larger stone. Both stents were grasped in the upper pole and dusted.  Description of procedure: After consent was obtained patient brought to the operating room. After adequate anesthesia he is placed in lithotomy position and prepped and draped in the usual sterile fashion. A timeout was performed to confirm the patient and procedure. The cystoscope was passed per urethra and the left ureteral stent grasped and removed through the urethral meatus and a sensor wire was advanced and coiled in the collecting system. The stent had become quite encrusted. A semirigid ureteroscope was advanced through the ureter to clear it and noted some small fragments from the stent and also more around stone which was grasped with an escape basket and dropped in the bladder. I was then able to run the semirigid all the way up to the proximal ureter to the renal pelvis without difficulty. A Glidewire was then advanced and the semirigid scope backed out. The ureteral access sheath was passed, medium. The dual channel digital ureteroscope was advanced but I could negotiated through a narrowed area in the proximal ureter. Therefore the single-channel digital ureteroscope was advanced and this passed easily. I inspected the lower pole and initially confined either stone although I can see them on fluoroscopy. I then rotated  the scope and found another lower pole calyx for the large stone was located. An escape basket was used to ensnare it and it was brought out and then dropped in the upper pole. I then looked back down and the other lower pole calyx and now the smaller stone was located near was grasped and also dropped and an upper pole calyx. Post stones were then dusted at a setting of 0.2 and 50 and they dusted quite well. Escape basket was used to bring out some of the biggest pieces which were only 1 or 2 mm. I then went back up and tried to grasp more stones from both stone piles and could not grasp any of the smaller fragments remained. Collecting system was inspected again and noted to not have any stone fragments and also fluoroscopy was now clear of any stones. The narrowed area in the proximal ureter was photographed. The access sheath and the scope were backed out together and the ureter noted to be normal without injury or stone fragment. The Glidewire was backloaded on the cystoscope and a 6 x 26 and ureter stent was advanced. The wire was removed with a good coil seen in the renal pelvis and a good coil in the bladder. The bladder was drained and the scope removed. I didn't see any other stones in the bladder. Lichen jelly was instilled per urethra. Patient was awakened and taken to recovery room in stable condition.  Complications: None  Blood loss: Minimal  Specimens: None  Drains: 6 x 26 cm left ureteral stent

## 2016-02-13 NOTE — Anesthesia Preprocedure Evaluation (Signed)
Anesthesia Evaluation  Patient identified by MRN, date of birth, ID band Patient awake    Reviewed: Allergy & Precautions, NPO status , Patient's Chart, lab work & pertinent test results  History of Anesthesia Complications (+) PONV and history of anesthetic complications  Airway Mallampati: II  TM Distance: >3 FB Neck ROM: Full    Dental no notable dental hx. (+) Dental Advisory Given   Pulmonary neg pulmonary ROS,    Pulmonary exam normal breath sounds clear to auscultation       Cardiovascular negative cardio ROS Normal cardiovascular exam Rhythm:Regular Rate:Normal     Neuro/Psych  Headaches, negative psych ROS   GI/Hepatic negative GI ROS, Neg liver ROS,   Endo/Other  obesity  Renal/GU Renal disease  negative genitourinary   Musculoskeletal negative musculoskeletal ROS (+)   Abdominal   Peds negative pediatric ROS (+)  Hematology negative hematology ROS (+)   Anesthesia Other Findings   Reproductive/Obstetrics negative OB ROS                             Anesthesia Physical  Anesthesia Plan  ASA: II  Anesthesia Plan: General   Post-op Pain Management:    Induction: Intravenous  Airway Management Planned: LMA  Additional Equipment:   Intra-op Plan:   Post-operative Plan: Extubation in OR  Informed Consent: I have reviewed the patients History and Physical, chart, labs and discussed the procedure including the risks, benefits and alternatives for the proposed anesthesia with the patient or authorized representative who has indicated his/her understanding and acceptance.   Dental advisory given  Plan Discussed with: CRNA  Anesthesia Plan Comments:         Anesthesia Quick Evaluation

## 2016-02-13 NOTE — Transfer of Care (Signed)
Immediate Anesthesia Transfer of Care Note  Patient: Thomas Howell  Procedure(s) Performed: Procedure(s): CYSTOSCOPY WITH LEFT URETEROSCOPY, HOLMIUM LASER AND STENT PLACEMENT (Left)  Patient Location: PACU  Anesthesia Type:General  Level of Consciousness: Patient easily awoken, sedated, comfortable, cooperative, following commands, responds to stimulation.   Airway & Oxygen Therapy: Patient spontaneously breathing, ventilating well, oxygen via simple oxygen mask.  Post-op Assessment: Report given to PACU RN, vital signs reviewed and stable, moving all extremities.   Post vital signs: Reviewed and stable.  Complications: No apparent anesthesia complications

## 2016-02-13 NOTE — Anesthesia Procedure Notes (Signed)
Procedure Name: LMA Insertion Date/Time: 02/13/2016 7:36 AM Performed by: Ludwig Lean Pre-anesthesia Checklist: Patient identified, Emergency Drugs available, Suction available and Patient being monitored Patient Re-evaluated:Patient Re-evaluated prior to inductionOxygen Delivery Method: Circle system utilized Preoxygenation: Pre-oxygenation with 100% oxygen Intubation Type: IV induction Ventilation: Mask ventilation without difficulty LMA: LMA with gastric port inserted LMA Size: 5.0 Number of attempts: 1 Placement Confirmation: positive ETCO2 and breath sounds checked- equal and bilateral Tube secured with: Tape Dental Injury: Teeth and Oropharynx as per pre-operative assessment

## 2016-02-16 ENCOUNTER — Encounter (HOSPITAL_COMMUNITY): Payer: Self-pay | Admitting: Urology

## 2016-03-19 ENCOUNTER — Telehealth: Payer: Self-pay | Admitting: Family Medicine

## 2016-03-19 ENCOUNTER — Emergency Department
Admission: EM | Admit: 2016-03-19 | Discharge: 2016-03-19 | Disposition: A | Discharge status: Home / Self Care | Payer: BLUE CROSS/BLUE SHIELD | Attending: Emergency Medicine | Admitting: Emergency Medicine

## 2016-03-19 ENCOUNTER — Encounter: Payer: Self-pay | Admitting: Emergency Medicine

## 2016-03-19 DIAGNOSIS — Z7722 Contact with and (suspected) exposure to environmental tobacco smoke (acute) (chronic): Secondary | ICD-10-CM | POA: Insufficient documentation

## 2016-03-19 DIAGNOSIS — M4126 Other idiopathic scoliosis, lumbar region: Secondary | ICD-10-CM | POA: Insufficient documentation

## 2016-03-19 DIAGNOSIS — R112 Nausea with vomiting, unspecified: Secondary | ICD-10-CM | POA: Insufficient documentation

## 2016-03-19 DIAGNOSIS — Z79899 Other long term (current) drug therapy: Secondary | ICD-10-CM | POA: Diagnosis not present

## 2016-03-19 DIAGNOSIS — G43909 Migraine, unspecified, not intractable, without status migrainosus: Secondary | ICD-10-CM | POA: Diagnosis not present

## 2016-03-19 DIAGNOSIS — R6889 Other general symptoms and signs: Secondary | ICD-10-CM

## 2016-03-19 LAB — RAPID INFLUENZA A&B ANTIGENS
Influenza A (ARMC): NEGATIVE
Influenza B (ARMC): NEGATIVE

## 2016-03-19 MED ORDER — KETOROLAC TROMETHAMINE 30 MG/ML IJ SOLN
30.0000 mg | Freq: Once | INTRAMUSCULAR | Status: AC
  Administered 2016-03-19: 30 mg via INTRAMUSCULAR
  Filled 2016-03-19: qty 1

## 2016-03-19 MED ORDER — DIPHENHYDRAMINE HCL 50 MG/ML IJ SOLN
50.0000 mg | Freq: Once | INTRAMUSCULAR | Status: AC
  Administered 2016-03-19: 50 mg via INTRAMUSCULAR
  Filled 2016-03-19: qty 1

## 2016-03-19 MED ORDER — ONDANSETRON 4 MG PO TBDP
4.0000 mg | ORAL_TABLET | Freq: Once | ORAL | Status: AC | PRN
  Administered 2016-03-19: 4 mg via ORAL

## 2016-03-19 MED ORDER — PROCHLORPERAZINE EDISYLATE 5 MG/ML IJ SOLN
10.0000 mg | Freq: Once | INTRAMUSCULAR | Status: AC
  Administered 2016-03-19: 10 mg via INTRAMUSCULAR
  Filled 2016-03-19: qty 2

## 2016-03-19 MED ORDER — OSELTAMIVIR PHOSPHATE 75 MG PO CAPS: 75.0000 mg | ORAL_CAPSULE | Freq: Two times a day (BID) | ORAL | Status: DC

## 2016-03-19 MED ORDER — ONDANSETRON 4 MG PO TBDP: 4.0000 mg | ORAL_TABLET | Freq: Three times a day (TID) | ORAL | Status: DC | PRN

## 2016-03-19 MED ORDER — ONDANSETRON 4 MG PO TBDP
ORAL_TABLET | ORAL | Status: AC
  Filled 2016-03-19: qty 1

## 2016-03-19 NOTE — Telephone Encounter (Signed)
Pt is having flu symptoms since yesterday. Mom wants to know if tamiflu can be called in.      [facility] [address] [place]

## 2016-03-19 NOTE — Telephone Encounter (Signed)
Med sent to pharmacy. Mom was notified.  

## 2016-03-19 NOTE — Telephone Encounter (Signed)
Tamiflu 75 twice a day for 5 days

## 2016-03-19 NOTE — Discharge Instructions (Signed)

## 2016-03-19 NOTE — Telephone Encounter (Signed)
Patient is having body aches, chills and coughing. Onset today. 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

## 2016-03-19 NOTE — ED Provider Notes (Signed)
[facility] Emergency Department Provider Note  ____________________________________________  Time seen: Approximately 9:58 PM  I have reviewed the triage vital signs and the nursing notes.   HISTORY  Chief Complaint Influenza and Emesis    HPI Thomas Howell is a 25 y.o. male who presents to emergency department complaining of malaise, fever, generalized body aches, and vomiting. Patient states that he woke up this morning with symptoms of malaise, body aches, and fever. He called his primary care provider and was given a Tamiflu without a diagnosis of the flu. Patient states that after taking his Tamiflu to begin to become sick on his stomach with nausea. He states that he has had 9 episodes of emesis today. Patient is able to keep down fluids but is unable to keep down solids at this time. Patient endorses a mild headache but denies any visual acuity changes, neck pain, chest pain, shortness of breath, abdominal pain, diarrhea or constipation.   Past Medical History  Diagnosis Date  . Migraine   . Complication of anesthesia   . PONV (postoperative nausea and vomiting)   . Left nephrolithiasis   . Wears glasses   . History of kidney stones   . Scoliosis of lumbar spine 1994    treated at [facility] until age 29    Patient Active Problem List   Diagnosis Date Noted  . Neck pain 02/04/2016  . Hydronephrosis, left 08/19/2015  . Hydronephrosis determined by ultrasound   . Kidney stone on left side 08/18/2015  . Nephrolithiasis 08/18/2015  . Kidney stone 07/06/2015  . Left ureteral stone 07/06/2015  . Analgesic rebound headache 06/05/2014  . Migraine headache without aura 05/30/2014    Past Surgical History  Procedure Laterality Date  . Foot capsule release w/ percutaneous heel cord lengthening, tibial tendon transfer Left 1994    clubfoot  . Lymph gland excision  2003    neck--  benign  . Cystoscopy with retrograde pyelogram, ureteroscopy and stent  placement Left 06/24/2014    Procedure: CYSTOSCOPY WITH RETROGRADE PYELOGRAM,  AND STENT PLACEMENT;  Surgeon: Magdalene Molly, MD;  Location: [facility];  Service: Urology;  Laterality: Left;  . Cystoscopy with retrograde pyelogram, ureteroscopy and stent placement Left 07/05/2014    Procedure: CYSTO/LEFT URETEROSCOPY/LEFT RETROGRADE PYELOGRAM/LEFT STENT PLACEMENT;  Surgeon: Jerilee Field, MD;  Location: [facility];  Service: Urology;  Laterality: Left;  . Holmium laser application Left 07/05/2014    Procedure: LASER LITHO;  Surgeon: Jerilee Field, MD;  Location: [facility];  Service: Urology;  Laterality: Left;  . Cystoscopy with retrograde pyelogram, ureteroscopy and stent placement Left 07/06/2015    Procedure: CYSTOSCOPY WITH RETROGRADE PYELOGRAM, URETEROSCOPY , LASER, STENT PLACEMENT and BASKET EXTRACTION;  Surgeon: Jerilee Field, MD;  Location: [facility];  Service: Urology;  Laterality: Left;  . Cystoscopy w/ retrogrades Right 07/06/2015    Procedure: CYSTOSCOPY WITH RETROGRADE PYELOGRAM;  Surgeon: Jerilee Field, MD;  Location: [facility];  Service: Urology;  Laterality: Right;  . Extracorporeal shock wave lithotripsy Left 07-07-2015  &  12-26-2014  . Holmium laser application Left 08/08/2015    Procedure: HOLMIUM LASER  WITH LITHOTRIPSY ;  Surgeon: Jerilee Field, MD;  Location: [facility];  Service: Urology;  Laterality: Left;  . Cystoscopy/retrograde/ureteroscopy/stone extraction with basket Left 08/08/2015    Procedure: CYSTOSCOPY/RETROGRADE/URETEROSCOPY/STONE EXTRACTION WITH BASKET;  Surgeon: Jerilee Field, MD;  Location: [facility];  Service: Urology;  Laterality: Left;  . Cystoscopy w/ ureteral stent placement Left  08/08/2015    Procedure: CYSTOSCOPY WITH STENT REPLACEMENT;  Surgeon: Jerilee FieldMatthew Eskridge, MD;  [facility];  Service: Urology;  Laterality: Left;  . Cystoscopy with retrograde pyelogram,  ureteroscopy and stent placement Left 08/19/2015    Procedure: CYSTOSCOPY WITH RETROGRADE PYELOGRAM, URETEROSCOPY AND STENT PLACEMENT;  Surgeon: Jerilee FieldMatthew Eskridge, MD;  Location: [facility];  Service: Urology;  Laterality: Left;  . Cystoscopy with ureteroscopy and stent placement Left 01/16/2016    Procedure: CYSTOSCOPY WITH LEFT RETROGRADE PYELOGRAM  LEFT DIGITAL URETEROSCOPY AND PLACEMENT LEFT URETERAL STENT;  Surgeon: Jerilee FieldMatthew Eskridge, MD;  Location: [facility];  Service: Urology;  Laterality: Left;  . Cystoscopy with ureteroscopy, stone basketry and stent placement Left 02/13/2016    Procedure: CYSTOSCOPY WITH LEFT URETEROSCOPY, HOLMIUM LASER AND STENT PLACEMENT;  Surgeon: Jerilee FieldMatthew Eskridge, MD;  Location: [facility];  Service: Urology;  Laterality: Left;    Current Outpatient Rx  Name  Route  Sig  Dispense  Refill  . amitriptyline (ELAVIL) 25 MG tablet      Take 1 tablet by mouth at bedtime for 3 days, then take 2 tablets at bedtime for 3 days, then take 3 tablets at bedtime. Patient taking differently: Take 25-75 mg by mouth at bedtime. Pt is weaning off medication. Last dose 02/09/16. Took 75mg  at bedtime, then took 50mg  at bedtime for 3 nights, then 1 tablet at bedtime for 3 nights.   90 tablet   3   . aspirin-acetaminophen-caffeine (EXCEDRIN MIGRAINE) 250-250-65 MG tablet   Oral   Take 1 tablet by mouth every 6 (six) hours as needed for headache or migraine. Reported on 02/04/2016         . cephALEXin (KEFLEX) 500 MG capsule   Oral   Take 1 capsule (500 mg total) by mouth daily.   30 capsule   0   . HYDROmorphone (DILAUDID) 4 MG tablet   Oral   Take 1 tablet (4 mg total) by mouth every 6 (six) hours as needed for severe pain.   30 tablet   0   . ketorolac (TORADOL) 60 MG/2ML SOLN injection   Intramuscular   Inject 2 mLs (60 mg total) into the muscle once. Dispense as six 2 ml vials Patient taking differently: Inject 60 mg into the muscle daily as needed (Migraines). Dispense as six 2 ml  vials   12 mL   5   . levETIRAcetam (KEPPRA) 500 MG tablet      One pill po in am and two pills po at night   90 tablet   11   . meloxicam (MOBIC) 15 MG tablet   Oral   Take 15 mg by mouth daily as needed for pain. Reported on 02/04/2016         . naproxen sodium (ANAPROX) 220 MG tablet   Oral   Take 400 mg by mouth every 12 (twelve) hours as needed (pain). Reported on 02/04/2016         . nitrofurantoin, macrocrystal-monohydrate, (MACROBID) 100 MG capsule   Oral   Take 1 capsule (100 mg total) by mouth at bedtime. Patient taking differently: Take 100 mg by mouth at bedtime. Completed on 02/08/16 per patient   21 capsule   0   . ondansetron (ZOFRAN-ODT) 4 MG disintegrating tablet   Oral   Take 1 tablet (4 mg total) by mouth every 8 (eight) hours as needed for nausea or vomiting.   20 tablet   0   . oseltamivir (TAMIFLU) 75 MG capsule  Oral   Take 1 capsule (75 mg total) by mouth 2 (two) times daily. X 5 days   10 capsule   0   . promethazine (PHENERGAN) 25 MG tablet   Oral   Take 1 tablet (25 mg total) by mouth every 6 (six) hours as needed for nausea or vomiting.   12 tablet   0   . rizatriptan (MAXALT-MLT) 10 MG disintegrating tablet   Oral   Take 1 tablet (10 mg total) by mouth as needed for migraine. May repeat in 2 hours if needed; max 2 per 24 hrs   10 tablet   2   . SYRINGE-NEEDLE, DISP, 3 ML (B-D 3CC LUER-LOK SYR 25GX1") 25G X 1" 3 ML MISC      Use with ketorolac   25 each   2   . tamsulosin (FLOMAX) 0.4 MG CAPS capsule   Oral   Take 1 capsule (0.4 mg total) by mouth daily after supper. Patient not taking: Reported on 01/23/2016   30 capsule   0     Allergies Codeine and Adhesive  Family History  Problem Relation Age of Onset  . Cancer Father   . Cancer Other   . Hypertension Other   . Hyperlipidemia Other   . Stroke Other   . Kidney Stones Mother   . Kidney Stones Brother     Social History Social History  Substance Use Topics   . Smoking status: Passive Smoke Exposure - Never Smoker -- 2 years  . Smokeless tobacco: Never Used  . Alcohol Use: 0.0 oz/week    0 Standard drinks or equivalent per week     Comment: occ     Review of Systems  Constitutional: Positive fever/chills Eyes: No visual changes. No discharge ENT: No sore throat.Denies nasal congestion. Denies ear pain. Cardiovascular: no chest pain. Respiratory: no cough. No SOB. Gastrointestinal: No abdominal pain.  Positive for nausea and vomiting.  No diarrhea.  No constipation. Genitourinary: Negative for dysuria. No hematuria Musculoskeletal: Positive for generalized myalgias. Skin: Negative for rash. Neurological: Positive for headache but denies focal weakness or numbness. 10-point ROS otherwise negative.  ____________________________________________   PHYSICAL EXAM:  VITAL SIGNS: ED Triage Vitals  Enc Vitals Group     BP 03/19/16 2003 130/73 mmHg     Pulse --      Resp 03/19/16 2003 20     Temp 03/19/16 2003 99.2 F (37.3 C)     Temp Source 03/19/16 2003 Oral     SpO2 03/19/16 2003 95 %     Weight 03/19/16 2003 200 lb (90.719 kg)     Height 03/19/16 2003  (1.727 m)     Head Cir --      Peak Flow --      Pain Score 03/19/16 2027 7     Pain Loc --      Pain Edu? --      Excl. in GC? --      Constitutional: Alert and oriented. Well appearing and in no acute distress. Eyes: Conjunctivae are normal. PERRL. EOMI. Head: Atraumatic. ENT:      Ears: EACs and TMs are unremarkable bilaterally.      Nose: No congestion/rhinnorhea.      Mouth/Throat: Mucous membranes are moist. Oropharynx is nonerythematous and nonedematous. Uvula is midline. Neck: No stridor. Neck is supple with full range of motion. Hematological/Lymphatic/Immunilogical: Diffuse, mobile, nontender anterior cervical lymphadenopathy. Cardiovascular: Normal rate, regular rhythm. Normal S1 and S2.  Good peripheral circulation.  Respiratory: Normal respiratory effort  without tachypnea or retractions. Lungs CTAB. Gastrointestinal: Bowel sounds 4 quadrants. Soft and nontender. No guarding or rigidity. No distention. No CVA tenderness. Musculoskeletal: Full range of motion all extremities Neurologic:  Normal speech and language. No gross focal neurologic deficits are appreciated.  Skin:  Skin is warm, dry and intact. No rash noted. Psychiatric: Mood and affect are normal. Speech and behavior are normal. Patient exhibits appropriate insight and judgement.   ____________________________________________   LABS (all labs ordered are listed, but only abnormal results are displayed)  Labs Reviewed  RAPID INFLUENZA A&B ANTIGENS ([facility] ONLY)   ____________________________________________  EKG   ____________________________________________  RADIOLOGY   No results found.  ____________________________________________    PROCEDURES  Procedure(s) performed:       Medications  ondansetron (ZOFRAN-ODT) disintegrating tablet 4 mg (4 mg Oral Given 03/19/16 2009)  ketorolac (TORADOL) 30 MG/ML injection 30 mg (30 mg Intramuscular Given 03/19/16 2227)  prochlorperazine (COMPAZINE) injection 10 mg (10 mg Intramuscular Given 03/19/16 2227)  diphenhydrAMINE (BENADRYL) injection 50 mg (50 mg Intramuscular Given 03/19/16 2226)     ____________________________________________   INITIAL IMPRESSION / ASSESSMENT AND PLAN / ED COURSE  Pertinent labs & imaging results that were available during my care of the patient were reviewed by me and considered in my medical decision making (see chart for details).  Patient's diagnosis is consistent with Flulike symptoms and emesis from Tamiflu. Patient states that he was treated without testing for the flu by his primary care provider. He states that he took Tamiflu and immediately began to have nausea and vomiting. Patient states that he still has his flulike symptoms consisting of fevers, generalized body aches, and  general malaise. Patient denies any abdominal pain. Exam is reassuring. Options were given to the patient to include further workup with labs and imaging and patient declined at this time. Patient requests symptomatic control medication for his nausea. He states that he will return to the emergency department or see his primary care provider should symptoms of nausea and vomiting persist or he have any new symptoms.. Patient will be discharged home with prescriptions for Zofran. Patient is to follow up with primary care provider if symptoms persist past this treatment course. Patient is given ED precautions to return to the ED for any worsening or new symptoms.     ____________________________________________  FINAL CLINICAL IMPRESSION(S) / ED DIAGNOSES  Final diagnoses:  Flu-like symptoms  Non-intractable vomiting with nausea, vomiting of unspecified type      NEW MEDICATIONS STARTED DURING THIS VISIT:  Discharge Medication List as of 03/19/2016 10:29 PM    START taking these medications   Details  ondansetron (ZOFRAN-ODT) 4 MG disintegrating tablet Take 1 tablet (4 mg total) by mouth every 8 (eight) hours as needed for nausea or vomiting., Starting 03/19/2016, Until Discontinued, Print            This chart was dictated using voice recognition software/Dragon. Despite best efforts to proofread, errors can occur which can change the meaning. Any change was purely unintentional.    Racheal Patches, PA-C 03/20/16 1610  Loleta Rose, MD 03/20/16 [phone]

## 2016-03-19 NOTE — ED Notes (Signed)
Pt presents to ED with fatigue, sever vomiting x9, body aches since today. Pt called PCP and was given tamiflu, afterwards " I got sick on my stomach". I had a bad headache.

## 2016-04-09 ENCOUNTER — Encounter: Payer: Self-pay | Admitting: Neurology

## 2016-04-09 ENCOUNTER — Ambulatory Visit (INDEPENDENT_AMBULATORY_CARE_PROVIDER_SITE_OTHER): Payer: BLUE CROSS/BLUE SHIELD | Admitting: Neurology

## 2016-04-09 VITALS — BP 122/90 | HR 64 | Resp 14 | Ht 68.0 in | Wt 209.5 lb

## 2016-04-09 DIAGNOSIS — G43009 Migraine without aura, not intractable, without status migrainosus: Secondary | ICD-10-CM | POA: Diagnosis not present

## 2016-04-09 DIAGNOSIS — G4440 Drug-induced headache, not elsewhere classified, not intractable: Secondary | ICD-10-CM

## 2016-04-09 DIAGNOSIS — M542 Cervicalgia: Secondary | ICD-10-CM | POA: Diagnosis not present

## 2016-04-09 DIAGNOSIS — T3995XA Adverse effect of unspecified nonopioid analgesic, antipyretic and antirheumatic, initial encounter: Secondary | ICD-10-CM

## 2016-04-09 MED ORDER — RIZATRIPTAN BENZOATE 10 MG PO TBDP: 10.0000 mg | ORAL_TABLET | ORAL | Status: DC | PRN

## 2016-04-09 MED ORDER — LEVETIRACETAM 750 MG PO TABS: ORAL_TABLET | ORAL | Status: DC

## 2016-04-09 NOTE — Progress Notes (Signed)
[facility]  PATIENT: Thomas Howell DOB: Jan 25, 1991  REFERRING DOCTOR OR PCP:  Ardyth Gal SOURCE: patient, records in EMR  _________________________________   HISTORICAL  CHIEF COMPLAINT:  Chief Complaint  Patient presents with  . Migraines    Sts. he tolerates Keppra well. He did stop Amitriptyline as directed.  Sts. h/a are just a little better--1-2 per week, same severity.  Maxalt helps more than Toradol./fim    HISTORY OF PRESENT ILLNESS:  Areon Cocuzza  is a 25 yo man who has had frequent migraine headaches since age 56.     At last visit, he was experiencing about 15 headaches a month lasting > 4 hours each day.   They have been at a frequency of at least 12 /month x many years.     A typical headache starts in the occiput and then radiates to the eye with a pounding quality.  He has no preceding aura.   He gets nausea and vomiting.   Moving increases the headache and laying in bed in a dark quiet room helps.    He has associated phonophobia and photophobia. Most headaches are random but some are triggered by strong smells such as perfume.  Maxalt helps most headaches .   He uses about 10/month and usually runs out by the end of the month.    He often takes in combination with Aleve.   Phenergan helps nausea    If he falls asleep, pain is better upon awakening.     On topiramate, the frequency dropped to 1/week but it caused him to have many more kidney stones.   At the last visit, we started Keppra 500 mg in am and 1000 mg at night.   He is tolerating Keppra well and reports slight improvement, now having about 8 headache days/month  In between headaches, he has some neck/occipital pain most days.  Currently, he has some neck pain on the left.  He sleeps well most nights, even before being on amitriptyline.  Medications:   He has tries nortriptyline without benefit.   Amitriptyline has not helped and is poorly tolerated.    Topiramate helped but  he had to stop due to multiple kidney stones (he now has a stent).  Aleve and other NSAID's by themself have not helped.     He has not been on Keppra, beta blocker or calcium channel blockers.    REVIEW OF SYSTEMS: Constitutional: No fevers, chills, sweats, or change in appetite Eyes: No visual changes, double vision, eye pain Ear, nose and throat: No hearing loss, ear pain, nasal congestion, sore throat Cardiovascular: No chest pain, palpitations Respiratory: No shortness of breath at rest or with exertion.   No wheezes GastrointestinaI: No nausea, vomiting, diarrhea, abdominal pain, fecal incontinence Genitourinary: No dysuria, urinary retention or frequency.  He has frequent kidney stones (sees Dr. Mena Goes at [facility]) Musculoskeletal: No neck pain, back pain Integumentary: No rash, pruritus, skin lesions Neurological: as above Psychiatric: No depression at this time.  No anxiety Endocrine: No palpitations, diaphoresis, change in appetite, change in weigh or increased thirst Hematologic/Lymphatic: No anemia, purpura, petechiae. Allergic/Immunologic: No itchy/runny eyes, nasal congestion, recent allergic reactions, rashes  ALLERGIES: Allergies  Allergen Reactions  . Codeine Swelling    Lips swell//  This includes cough syrup w/ codeine  . Adhesive [Tape] Rash    HOME MEDICATIONS:  Current outpatient prescriptions:  .  aspirin-acetaminophen-caffeine (EXCEDRIN MIGRAINE) 250-250-65 MG tablet, Take 1 tablet by mouth every 6 (  six) hours as needed for headache or migraine. Reported on 02/04/2016, Disp: , Rfl:  .  ketorolac (TORADOL) 60 MG/2ML SOLN injection, Inject 2 mLs (60 mg total) into the muscle once. Dispense as six 2 ml vials (Patient taking differently: Inject 60 mg into the muscle daily as needed (Migraines). Dispense as six 2 ml vials), Disp: 12 mL, Rfl: 5 .  levETIRAcetam (KEPPRA) 500 MG tablet, One pill po in am and two pills po at night, Disp: 90 tablet, Rfl:  11 .  naproxen sodium (ANAPROX) 220 MG tablet, Take 400 mg by mouth every 12 (twelve) hours as needed (pain). Reported on 04/09/2016, Disp: , Rfl:  .  promethazine (PHENERGAN) 25 MG tablet, Take 1 tablet (25 mg total) by mouth every 6 (six) hours as needed for nausea or vomiting., Disp: 12 tablet, Rfl: 0 .  rizatriptan (MAXALT-MLT) 10 MG disintegrating tablet, Take 1 tablet (10 mg total) by mouth as needed for migraine. May repeat in 2 hours if needed; max 2 per 24 hrs, Disp: 10 tablet, Rfl: 2 .  SYRINGE-NEEDLE, DISP, 3 ML (B-D 3CC LUER-LOK SYR 25GX1") 25G X 1" 3 ML MISC, Use with ketorolac, Disp: 25 each, Rfl: 2 .  meloxicam (MOBIC) 15 MG tablet, Take 15 mg by mouth daily as needed for pain. Reported on 04/09/2016, Disp: , Rfl:  .  ondansetron (ZOFRAN-ODT) 4 MG disintegrating tablet, Take 1 tablet (4 mg total) by mouth every 8 (eight) hours as needed for nausea or vomiting. (Patient not taking: Reported on 04/09/2016), Disp: 20 tablet, Rfl: 0 .  [DISCONTINUED] phentermine (ADIPEX-P) 37.5 MG tablet, Take 1 tablet (37.5 mg total) by mouth daily before breakfast., Disp: 30 tablet, Rfl: 2  PAST MEDICAL HISTORY: Past Medical History  Diagnosis Date  . Migraine   . Complication of anesthesia   . PONV (postoperative nausea and vomiting)   . Left nephrolithiasis   . Wears glasses   . History of kidney stones   . Scoliosis of lumbar spine 1994    treated at [facility] until age 25    PAST SURGICAL HISTORY: Past Surgical History  Procedure Laterality Date  . Foot capsule release w/ percutaneous heel cord lengthening, tibial tendon transfer Left 1994    clubfoot  . Lymph gland excision  2003    neck--  benign  . Cystoscopy with retrograde pyelogram, ureteroscopy and stent placement Left 06/24/2014    Procedure: CYSTOSCOPY WITH RETROGRADE PYELOGRAM,  AND STENT PLACEMENT;  Surgeon: Magdalene Mollyaniel Y Woodruff, MD;  Location: [facility];  Service: Urology;  Laterality: Left;  . Cystoscopy with retrograde pyelogram,  ureteroscopy and stent placement Left 07/05/2014    Procedure: CYSTO/LEFT URETEROSCOPY/LEFT RETROGRADE PYELOGRAM/LEFT STENT PLACEMENT;  Surgeon: Jerilee FieldMatthew Eskridge, MD;  [facility] [place];  Service: Urology;  Laterality: Left;  . Holmium laser application Left 07/05/2014    Procedure: LASER LITHO;  Surgeon: Jerilee FieldMatthew Eskridge, MD;  [facility] [place];  Service: Urology;  Laterality: Left;  . Cystoscopy with retrograde pyelogram, ureteroscopy and stent placement Left 07/06/2015    Procedure: CYSTOSCOPY WITH RETROGRADE PYELOGRAM, URETEROSCOPY , LASER, STENT PLACEMENT and BASKET EXTRACTION;  Surgeon: Jerilee FieldMatthew Eskridge, MD;  Location: [facility];  Service: Urology;  Laterality: Left;  . Cystoscopy w/ retrogrades Right 07/06/2015    Procedure: CYSTOSCOPY WITH RETROGRADE PYELOGRAM;  Surgeon: Jerilee FieldMatthew Eskridge, MD;  Location: [facility];  Service: Urology;  Laterality: Right;  . Extracorporeal shock wave lithotripsy Left 07-07-2015  &  12-26-2014  . Holmium laser application Left 08/08/2015  Procedure: HOLMIUM LASER  WITH LITHOTRIPSY ;  Surgeon: Jerilee Field, MD;  Location: [facility];  Service: Urology;  Laterality: Left;  . Cystoscopy/retrograde/ureteroscopy/stone extraction with basket Left 08/08/2015    Procedure: CYSTOSCOPY/RETROGRADE/URETEROSCOPY/STONE EXTRACTION WITH BASKET;  Surgeon: Jerilee Field, MD;  Location: [facility];  Service: Urology;  Laterality: Left;  . Cystoscopy w/ ureteral stent placement Left 08/08/2015    Procedure: CYSTOSCOPY WITH STENT REPLACEMENT;  Surgeon: Jerilee Field, MD;  Location: [facility];  Service: Urology;  Laterality: Left;  . Cystoscopy with retrograde pyelogram, ureteroscopy and stent placement Left 08/19/2015    Procedure: CYSTOSCOPY WITH RETROGRADE PYELOGRAM, URETEROSCOPY AND STENT PLACEMENT;  Surgeon: Jerilee Field, MD;  Location: [facility];  Service: Urology;  Laterality: Left;  .  Cystoscopy with ureteroscopy and stent placement Left 01/16/2016    Procedure: CYSTOSCOPY WITH LEFT RETROGRADE PYELOGRAM  LEFT DIGITAL URETEROSCOPY AND PLACEMENT LEFT URETERAL STENT;  Surgeon: Jerilee Field, MD;  Location: [facility];  Service: Urology;  Laterality: Left;  . Cystoscopy with ureteroscopy, stone basketry and stent placement Left 02/13/2016    Procedure: CYSTOSCOPY WITH LEFT URETEROSCOPY, HOLMIUM LASER AND STENT PLACEMENT;  Surgeon: Jerilee Field, MD;  Location: [facility];  Service: Urology;  Laterality: Left;    FAMILY HISTORY: Family History  Problem Relation Age of Onset  . Cancer Father   . Cancer Other   . Hypertension Other   . Hyperlipidemia Other   . Stroke Other   . Kidney Stones Mother   . Kidney Stones Brother     SOCIAL HISTORY:  Social History   Social History  . Marital Status: Single    Spouse Name: N/A  . Number of Children: N/A  . Years of Education: N/A   Occupational History  . Not on file.   Social History Main Topics  . Smoking status: Passive Smoke Exposure - Never Smoker -- 2 years  . Smokeless tobacco: Never Used  . Alcohol Use: 0.0 oz/week    0 Standard drinks or equivalent per week     Comment: occ  . Drug Use: No  . Sexual Activity: No   Other Topics Concern  . Not on file   Social History Narrative     PHYSICAL EXAM  Filed Vitals:   04/09/16 0837  BP: 122/90  Pulse: 64  Resp: 14  Height: 5\' 8"  (1.727 m)  Weight: 209 lb 8 oz (95.029 kg)    Body mass index is 31.86 kg/(m^2).   General: The patient is well-developed and well-nourished and in no acute distress  Neck: The neck is supple, no carotid bruits are noted.  The neck is tender over the left splenius capitis muscle/occipital nerve.  Neurologic Exam  Mental status: The patient is alert and oriented x 3 at the time of the examination. The patient has apparent normal recent and remote memory, with an apparently normal attention span and concentration ability.    Speech is normal.  Cranial nerves: Extraocular movements are full.   Facial symmetry is present. There is good facial sensation to soft touch bilaterally.Facial strength is normal.  Trapezius and sternocleidomastoid strength is normal. No dysarthria is noted.  The tongue is midline, and the patient has symmetric elevation of the soft palate. No obvious hearing deficits are noted.  Motor:  Muscle bulk is normal.   Tone is normal. Strength is  5 / 5 in all 4 extremities.   Coordination: Cerebellar testing reveals good finger-nose-finger ilaterally.  Gait and station: Station is  normal.   Gait is normal. Tandem gait is normal.       DIAGNOSTIC DATA (LABS, IMAGING, TESTING) - I reviewed patient records, labs, notes, testing and imaging myself where available.  Lab Results  Component Value Date   WBC 6.8 01/15/2016   HGB 15.5 01/15/2016   HCT 44.9 01/15/2016   MCV 88.7 01/15/2016   PLT 188 01/15/2016      Component Value Date/Time   NA 141 01/15/2016 0541   K 4.2 01/15/2016 0541   CL 109 01/15/2016 0541   CO2 24 01/15/2016 0541   GLUCOSE 112* 01/15/2016 0541   BUN 16 01/15/2016 0541   CREATININE 1.27* 01/15/2016 0541   CALCIUM 9.5 01/15/2016 0541   PROT 7.9 07/14/2015 2215   ALBUMIN 4.5 07/14/2015 2215   AST 18 07/14/2015 2215   ALT 12* 07/14/2015 2215   ALKPHOS 53 07/14/2015 2215   BILITOT 0.4 07/14/2015 2215   GFRNONAA >60 01/15/2016 0541   GFRAA >60 01/15/2016 0541       ASSESSMENT AND PLAN  Migraine without aura and without status migrainosus, not intractable  Analgesic rebound headache  Neck pain   1.  Increase Keppra to 750 mg in a.m. and 1500 mg at night..    2.   Continue prn Maxalt. 3.   Return in 2 sooner if there are significant changes in the headache frequency or other new neurologic symptoms.    Richard A. Epimenio Foot, MD, PhD 04/09/2016, 8:47 AM Certified in Neurology, Clinical Neurophysiology, Sleep Medicine, Pain Medicine and Neuroimaging  [facility] [address] [place], [place] [address] [phone]

## 2016-05-22 IMAGING — US US RENAL
1 series · 14 of 25 positions shown · non-contrast
Comparison: CT of the abdomen and pelvis performed 06/26/2015, and
renal ultrasound performed 08/18/2015

CLINICAL DATA: Acute onset of left flank pain.  Initial encounter.

EXAM:
RENAL / URINARY TRACT ULTRASOUND COMPLETE

[Series 1: us renal · 0.25mm/px · 14 of 43 slices shown]
[im 1/43]
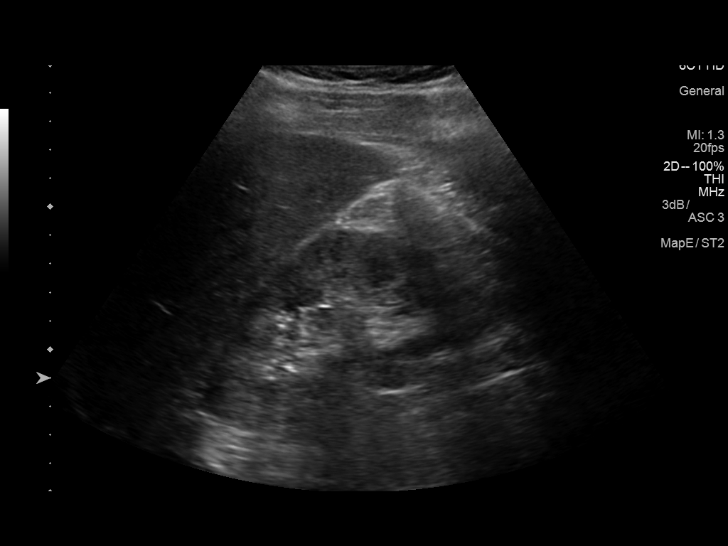
[im 4/43]
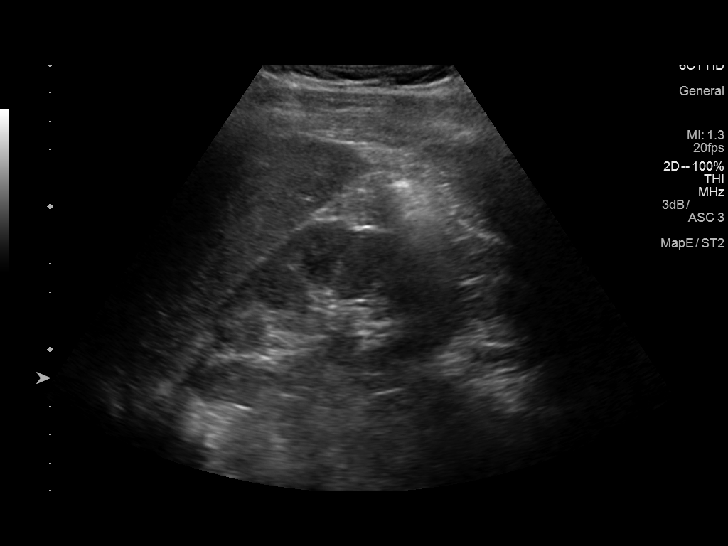
[im 8/43]
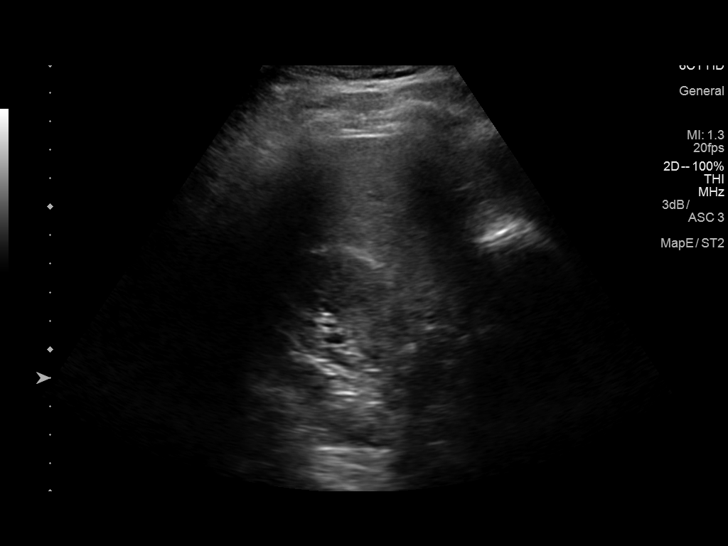
[im 11/43]
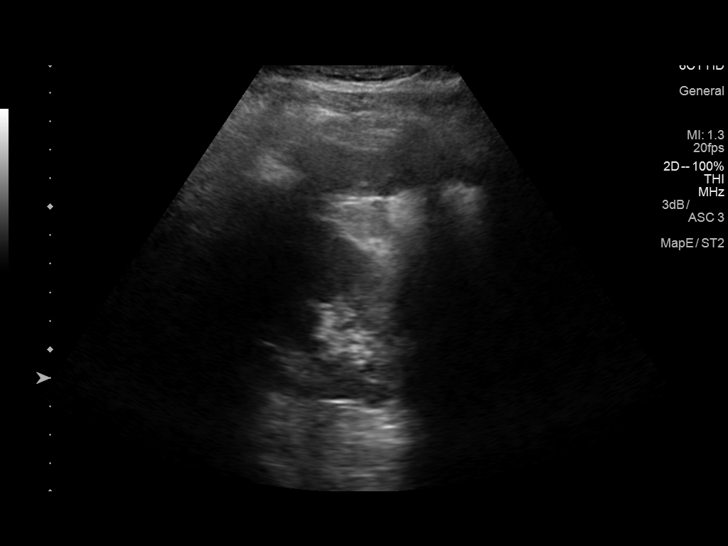
[im 15/43]
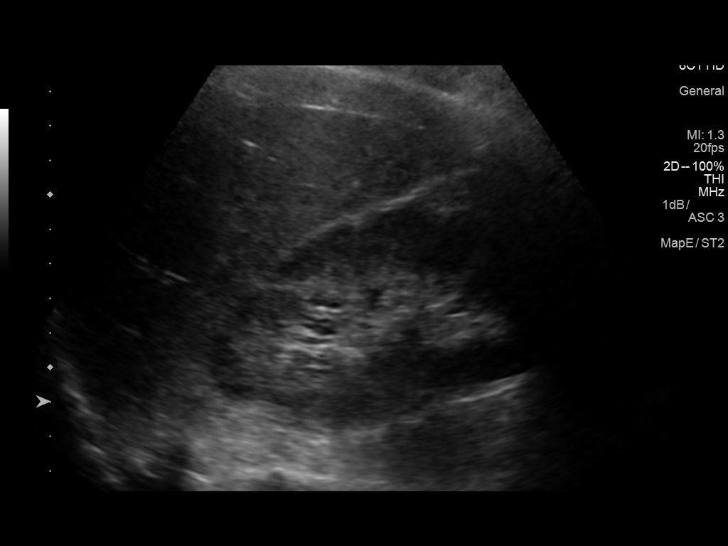
[im 16/43]
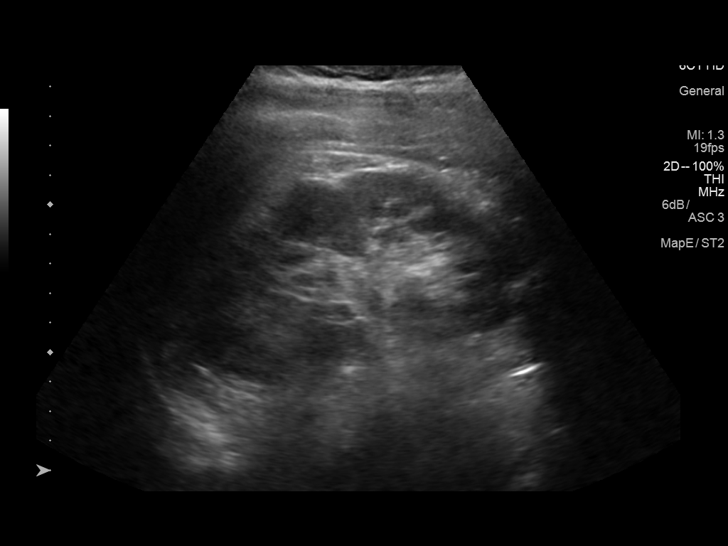
[im 20/43]
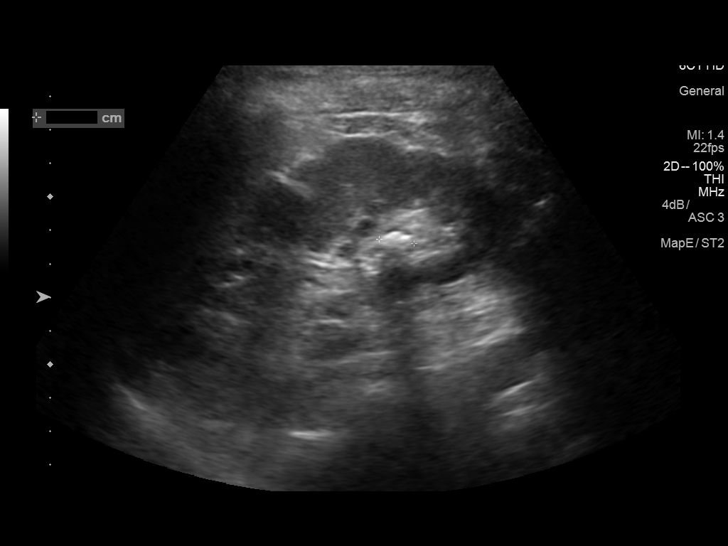
[im 23/43]
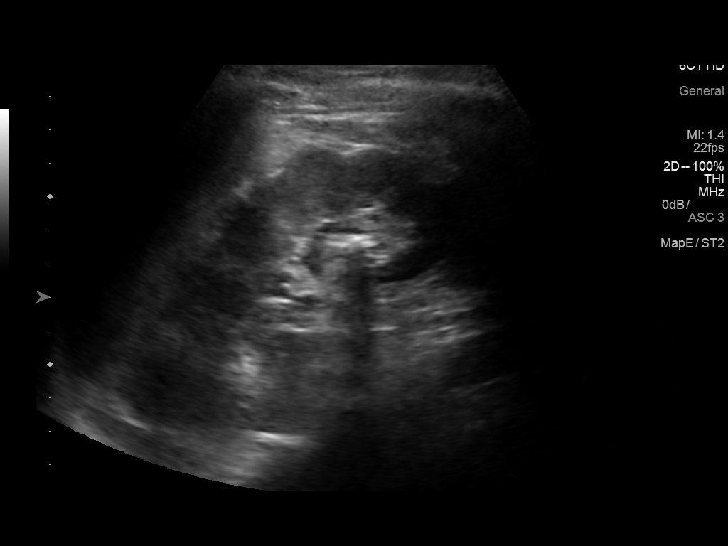
[im 27/43]
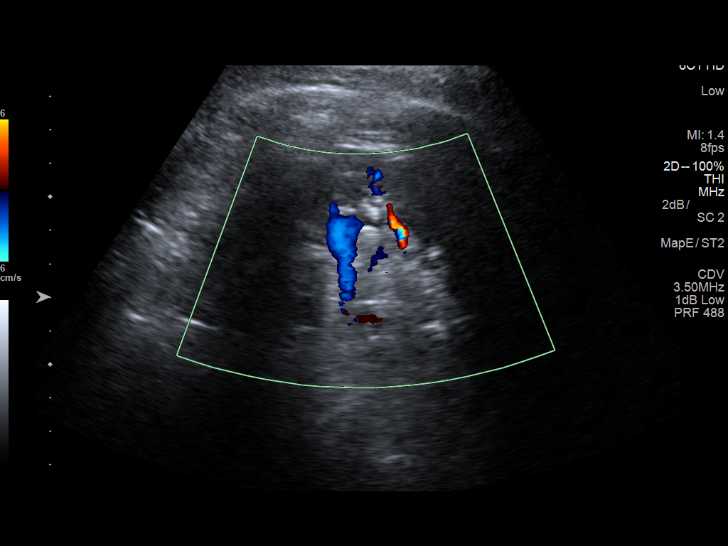
[im 29/43]
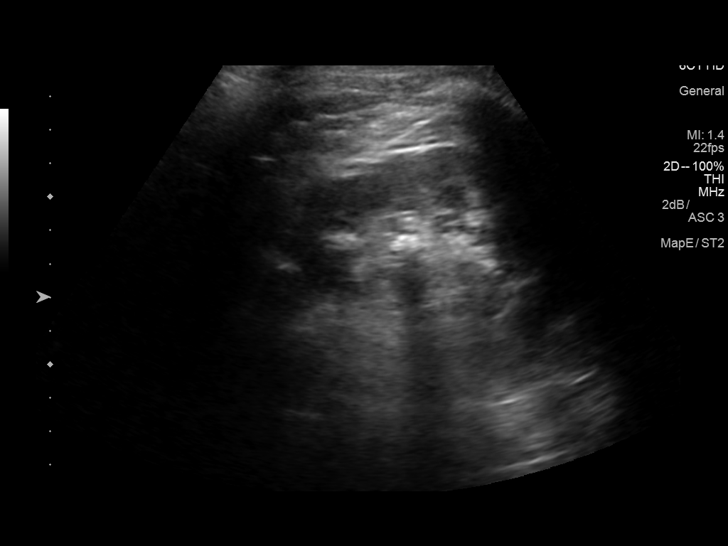
[im 32/43]
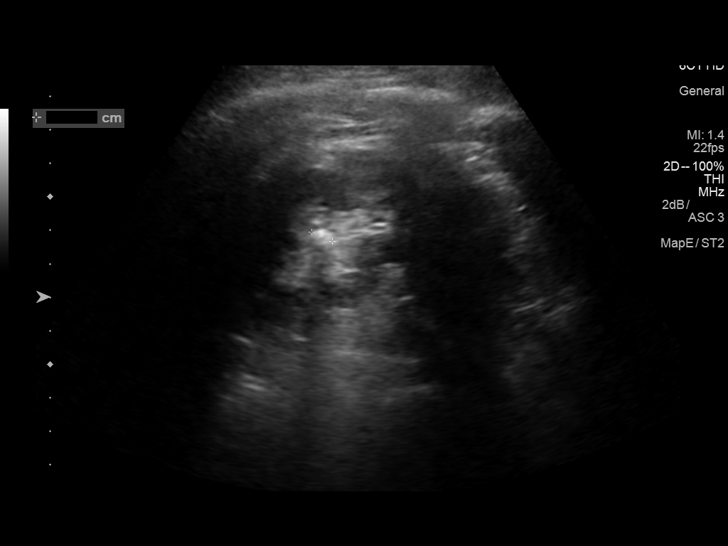
[im 36/43]
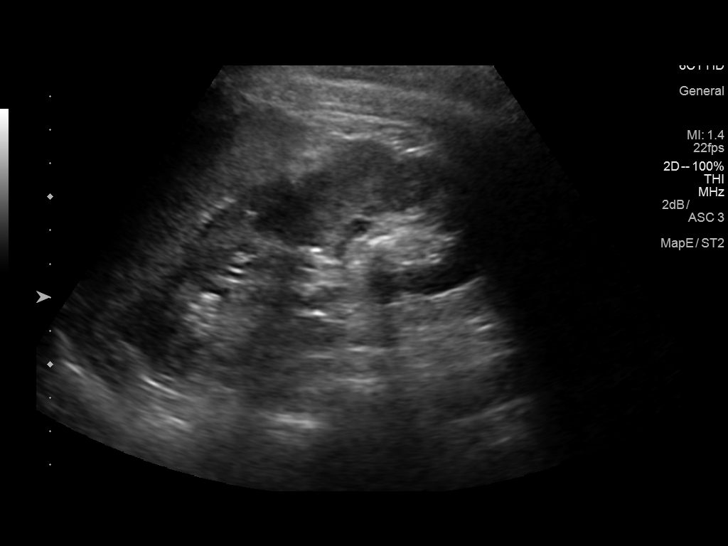
[im 39/43]
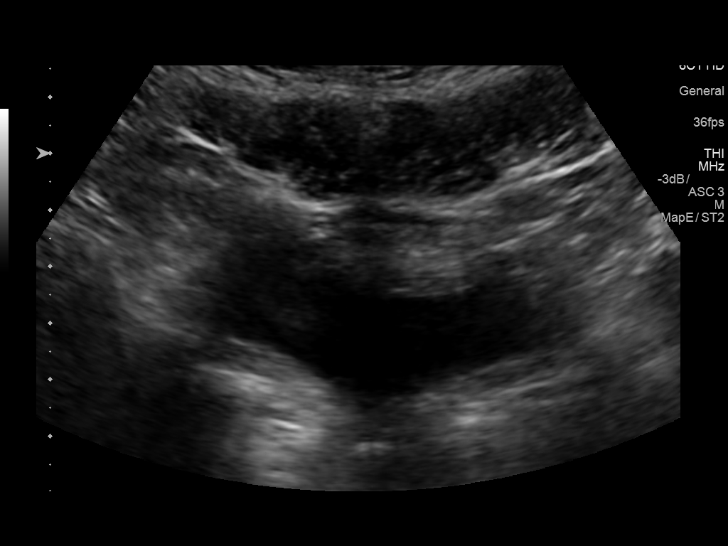
[im 43/43]
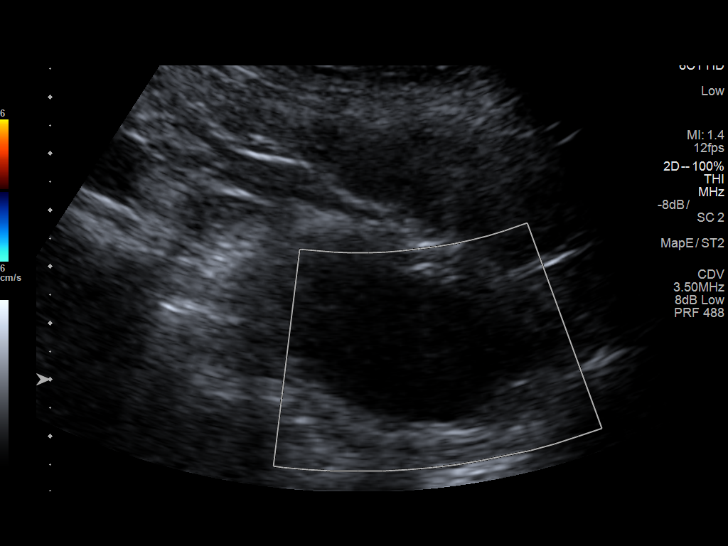

[14 of 25 positions shown; findings below may reference images not displayed]

FINDINGS: Right Kidney:

Length: 12.5 cm. Echogenicity within normal limits. A 4 mm stone is
noted at the interpole region of the right kidney. No mass or
hydronephrosis visualized.

Left Kidney:

Length: 12.0 cm. Echogenicity within normal limits. Two stones are
seen at the lower pole of the left kidney, measuring 1.2 cm and
cm. No mass or hydronephrosis visualized.

Bladder:

Appears normal for degree of bladder distention.
IMPRESSION: 1. No evidence of hydronephrosis.
2. Nonobstructing bilateral renal stones seen.

## 2016-06-15 ENCOUNTER — Emergency Department (HOSPITAL_COMMUNITY)
Admission: EM | Admit: 2016-06-15 | Discharge: 2016-06-15 | Disposition: A | Discharge status: Home / Self Care | Payer: BLUE CROSS/BLUE SHIELD | Attending: Emergency Medicine | Admitting: Emergency Medicine

## 2016-06-15 ENCOUNTER — Encounter (HOSPITAL_COMMUNITY): Payer: Self-pay | Admitting: Emergency Medicine

## 2016-06-15 ENCOUNTER — Emergency Department (HOSPITAL_COMMUNITY): Payer: BLUE CROSS/BLUE SHIELD

## 2016-06-15 DIAGNOSIS — Z7722 Contact with and (suspected) exposure to environmental tobacco smoke (acute) (chronic): Secondary | ICD-10-CM | POA: Insufficient documentation

## 2016-06-15 DIAGNOSIS — N200 Calculus of kidney: Secondary | ICD-10-CM | POA: Insufficient documentation

## 2016-06-15 DIAGNOSIS — R109 Unspecified abdominal pain: Secondary | ICD-10-CM | POA: Diagnosis not present

## 2016-06-15 DIAGNOSIS — Z79899 Other long term (current) drug therapy: Secondary | ICD-10-CM | POA: Diagnosis not present

## 2016-06-15 DIAGNOSIS — N132 Hydronephrosis with renal and ureteral calculous obstruction: Secondary | ICD-10-CM | POA: Diagnosis not present

## 2016-06-15 MED ORDER — KETOROLAC TROMETHAMINE 30 MG/ML IJ SOLN
30.0000 mg | Freq: Once | INTRAMUSCULAR | Status: AC
  Administered 2016-06-15: 30 mg via INTRAVENOUS
  Filled 2016-06-15: qty 1

## 2016-06-15 MED ORDER — HYDROMORPHONE HCL 1 MG/ML IJ SOLN
1.0000 mg | Freq: Once | INTRAMUSCULAR | Status: AC
  Administered 2016-06-15: 1 mg via INTRAVENOUS
  Filled 2016-06-15: qty 1

## 2016-06-15 MED ORDER — TAMSULOSIN HCL 0.4 MG PO CAPS
0.4000 mg | ORAL_CAPSULE | Freq: Every day | ORAL | Status: DC
Start: 2016-06-15 — End: 2016-06-15
  Administered 2016-06-15: 0.4 mg via ORAL

## 2016-06-15 MED ORDER — LIDOCAINE IN D5W 4-5 MG/ML-% IV SOLN
2.0000 mg/min | INTRAVENOUS | Status: AC
  Administered 2016-06-15: 2 mg/min via INTRAVENOUS
  Filled 2016-06-15: qty 500

## 2016-06-15 MED ORDER — SODIUM CHLORIDE 0.9 % IV SOLN
Freq: Once | INTRAVENOUS | Status: AC
  Administered 2016-06-15: 1000 mL/h via INTRAVENOUS

## 2016-06-15 MED ORDER — PEG 3350-KCL-NA BICARB-NACL 420 G PO SOLR: 4000.0000 mL | Freq: Once | ORAL | Status: DC

## 2016-06-15 MED ORDER — OXYCODONE-ACETAMINOPHEN 5-325 MG PO TABS
2.0000 | ORAL_TABLET | Freq: Once | ORAL | Status: AC
  Administered 2016-06-15: 2 via ORAL
  Filled 2016-06-15: qty 2

## 2016-06-15 MED ORDER — ONDANSETRON HCL 4 MG/2ML IJ SOLN
4.0000 mg | Freq: Once | INTRAMUSCULAR | Status: AC
  Administered 2016-06-15: 4 mg via INTRAVENOUS
  Filled 2016-06-15: qty 2

## 2016-06-15 MED ORDER — TAMSULOSIN HCL 0.4 MG PO CAPS
ORAL_CAPSULE | ORAL | Status: AC
  Administered 2016-06-15: 0.4 mg via ORAL
  Filled 2016-06-15: qty 1

## 2016-06-15 NOTE — ED Notes (Addendum)
Pt states left flank pain since 0800 this morning with vomiting.  Hx of kidney stones.  Temp deferred due to active vomiting.

## 2016-06-15 NOTE — ED Provider Notes (Signed)
CSN: [id number]     Arrival date & time 06/15/16  1013 History  By signing my name below, I, Thomas Howell, attest that this documentation has been prepared under the direction and in the presence of Thomas MemosJason Johanne Mcglade, MD. Electronically Signed: Tanda RockersMargaux Howell, ED Scribe. 06/15/2016. 10:47 AM.   Chief Complaint  Patient presents with  . Flank Pain   The history is provided by the patient. No language interpreter was used.    HPI Comments: Sheliah MendsRussell K Kerwood is a 25 y.o. male who presents to the Emergency Department complaining of sudden onset, constant, left flank pain radiating to LLQ that began at 8 AM this morning. Pt has hx of recurrent kidney stones and states this pain feels similar. Pt has had to have lithotripsy in the past. The largest kidney stones he had passed on his own was 4-5 mm. He also complains of nausea and vomiting. He has not taken anything for his symptoms. Denies dysuria, hematuria, fever, diarrhea, constipation, chest pain, cough, rash, or any other associated symptoms. No recent illness or recent travel.   Past Medical History  Diagnosis Date  . Migraine   . Complication of anesthesia   . PONV (postoperative nausea and vomiting)   . Left nephrolithiasis   . Wears glasses   . History of kidney stones   . Scoliosis of lumbar spine 1994    treated at [facility] until age 25   Past Surgical History  Procedure Laterality Date  . Foot capsule release w/ percutaneous heel cord lengthening, tibial tendon transfer Left 1994    clubfoot  . Lymph gland excision  2003    neck--  benign  . Cystoscopy with retrograde pyelogram, ureteroscopy and stent placement Left 06/24/2014    Procedure: CYSTOSCOPY WITH RETROGRADE PYELOGRAM,  AND STENT PLACEMENT;  Surgeon: Magdalene Mollyaniel Y Woodruff, MD;  Location: [facility];  Service: Urology;  Laterality: Left;  . Cystoscopy with retrograde pyelogram, ureteroscopy and stent placement Left 07/05/2014    Procedure: CYSTO/LEFT URETEROSCOPY/LEFT RETROGRADE  PYELOGRAM/LEFT STENT PLACEMENT;  Surgeon: Jerilee FieldMatthew Eskridge, MD;  [facility] [place];  Service: Urology;  Laterality: Left;  . Holmium laser application Left 07/05/2014    Procedure: LASER LITHO;  Surgeon: Jerilee FieldMatthew Eskridge, MD;  [facility] [place];  Service: Urology;  Laterality: Left;  . Cystoscopy with retrograde pyelogram, ureteroscopy and stent placement Left 07/06/2015    Procedure: CYSTOSCOPY WITH RETROGRADE PYELOGRAM, URETEROSCOPY , LASER, STENT PLACEMENT and BASKET EXTRACTION;  Surgeon: Jerilee FieldMatthew Eskridge, MD;  Location: [facility];  Service: Urology;  Laterality: Left;  . Cystoscopy w/ retrogrades Right 07/06/2015    Procedure: CYSTOSCOPY WITH RETROGRADE PYELOGRAM;  Surgeon: Jerilee FieldMatthew Eskridge, MD;  Location: [facility];  Service: Urology;  Laterality: Right;  . Extracorporeal shock wave lithotripsy Left 07-07-2015  &  12-26-2014  . Holmium laser application Left 08/08/2015    Procedure: HOLMIUM LASER  WITH LITHOTRIPSY ;  Surgeon: Jerilee FieldMatthew Eskridge, MD;  [facility] [place];  Service: Urology;  Laterality: Left;  . Cystoscopy/retrograde/ureteroscopy/stone extraction with basket Left 08/08/2015    Procedure: CYSTOSCOPY/RETROGRADE/URETEROSCOPY/STONE EXTRACTION WITH BASKET;  Surgeon: Jerilee FieldMatthew Eskridge, MD;  [facility] [place];  Service: Urology;  Laterality: Left;  . Cystoscopy w/ ureteral stent placement Left 08/08/2015    Procedure: CYSTOSCOPY WITH STENT REPLACEMENT;  Surgeon: Jerilee FieldMatthew Eskridge, MD;  [facility]  Service: Urology;  Laterality: Left;  . Cystoscopy with retrograde pyelogram, ureteroscopy and stent placement Left 08/19/2015    Procedure: CYSTOSCOPY WITH RETROGRADE PYELOGRAM,  URETEROSCOPY AND STENT PLACEMENT;  Surgeon: Jerilee Field, MD;  Location: [facility];  Service: Urology;  Laterality: Left;  . Cystoscopy with ureteroscopy and stent placement Left 01/16/2016    Procedure: CYSTOSCOPY WITH LEFT  RETROGRADE PYELOGRAM  LEFT DIGITAL URETEROSCOPY AND PLACEMENT LEFT URETERAL STENT;  Surgeon: Jerilee Field, MD;  Location: [facility];  Service: Urology;  Laterality: Left;  . Cystoscopy with ureteroscopy, stone basketry and stent placement Left 02/13/2016    Procedure: CYSTOSCOPY WITH LEFT URETEROSCOPY, HOLMIUM LASER AND STENT PLACEMENT;  Surgeon: Jerilee Field, MD;  Location: [facility];  Service: Urology;  Laterality: Left;   Family History  Problem Relation Age of Onset  . Cancer Father   . Cancer Other   . Hypertension Other   . Hyperlipidemia Other   . Stroke Other   . Kidney Stones Mother   . Kidney Stones Brother    Social History  Substance Use Topics  . Smoking status: Passive Smoke Exposure - Never Smoker -- 2 years  . Smokeless tobacco: Never Used  . Alcohol Use: 0.0 oz/week    0 Standard drinks or equivalent per week     Comment: occ    Review of Systems  Constitutional: Negative for fever.  Cardiovascular: Negative for chest pain.  Gastrointestinal: Positive for nausea, vomiting and abdominal pain. Negative for diarrhea and constipation.  Genitourinary: Positive for flank pain. Negative for dysuria and hematuria.  Skin: Negative for rash.  All other systems reviewed and are negative.  Allergies  Codeine and Adhesive  Home Medications   Prior to Admission medications   Medication Sig Start Date End Date Taking? Authorizing Provider  levETIRAcetam (KEPPRA) 750 MG tablet Take 750-1,500 mg by mouth 2 (two) times daily. One tablet in the morning and two tablets at bedtime   Yes Historical Provider, MD  rizatriptan (MAXALT-MLT) 10 MG disintegrating tablet Take 1 tablet (10 mg total) by mouth as needed for migraine. May repeat in 2 hours if needed; max 2 per 24 hrs 04/09/16  Yes Asa Lente, MD   BP 113/64 mmHg  Pulse 64  Temp(Src) 98.2 F (36.8 C) (Oral)  Resp 12  Ht  (1.727 m)  Wt 200 lb (90.719 kg)  BMI 30.42 kg/m2  SpO2 100%   Physical Exam   Constitutional: He is oriented to person, place, and time. He appears well-developed and well-nourished. He appears distressed.  Pt rocking back and forth in bed and in mild distress.   HENT:  Head: Normocephalic and atraumatic.  Eyes: Conjunctivae and EOM are normal.  Neck: Neck supple. No tracheal deviation present.  Cardiovascular: Normal rate.   Pulmonary/Chest: Effort normal. No tachypnea. No respiratory distress.  Abdominal: Soft. There is no tenderness. There is CVA tenderness.  Left CVA tenderness. No rash to area.  Abdomen benign.   Musculoskeletal: Normal range of motion.  No midline tenderness.   Neurological: He is alert and oriented to person, place, and time.  Skin: Skin is warm and dry.  Psychiatric: He has a normal mood and affect. His behavior is normal.  Nursing note and vitals reviewed.   ED Course  Procedures (including critical care time)  DIAGNOSTIC STUDIES: Oxygen Saturation is 100% on RA, normal by my interpretation.    COORDINATION OF CARE: 10:44 AM-Discussed treatment plan which includes CT Renal Stone Study with pt at bedside and pt agreed to plan.   Labs Review Labs Reviewed - No data to display  Imaging Review Ct Renal Stone Study  06/15/2016  CLINICAL  DATA:  Acute onset left flank pain this morning. nausea and vomiting. Urolithiasis. EXAM: CT ABDOMEN AND PELVIS WITHOUT CONTRAST TECHNIQUE: Multidetector CT imaging of the abdomen and pelvis was performed following the standard protocol without IV contrast. COMPARISON:  01/15/2016 FINDINGS: Lower chest:  No acute findings. Hepatobiliary: No significant abnormality seen. Gallbladder is unremarkable. Pancreas: No abnormality identified. Spleen: Normal size and appearance. Adrenals/Urinary Tract: Normal adrenal glands. Bilateral intrarenal calculi again demonstrated, largest in lower pole of left kidney measuring 11 mm. Moderate left hydronephrosis and proximal ureterectasis remains stable. He 3 mm calculus  is again seen in the left mid ureter at the level of the iliac crest on image 52 which is unchanged in location since previous study. No other ureteral calculi identified. 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 This is unchanged in appearance. No evidence of bowel obstruction, volvulus, inflammatory process, or abnormal fluid collections. Vascular/Lymphatic: No pathologically enlarged lymph nodes identified. No evidence of abdominal aortic aneurysm. Reproductive: Normal size prostate and seminal vesicles. Other: None Musculoskeletal: No suspicious bone lesions identified. 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 IMPRESSION: Moderate left hydronephrosis due to 3 mm left mid ureteral calculus, without significant change compared to prior study. Bilateral nonobstructive nephrolithiasis. 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 Electronically Signed   By: Myles RosenthalJohn  Stahl M.D.   On: 06/15/2016 11:52   I have personally reviewed and evaluated these images and lab results as part of my medical decision-making.   EKG Interpretation None      MDM   Final diagnoses:  Kidney stone    A 25 year old male here with a kidney stone that passed after lidocaine and Toradol while in the emergency department. Patient's symptoms improved significantly with this. He will follow-up with his urologist for continued workup of his continued source of stones otherwise no other emergent pathology at this time.   New Prescriptions: Discharge Medication List as of 06/15/2016  1:48 PM      I have personally and contemperaneously reviewed labs and imaging and used in my decision making as above.   A medical screening exam was performed and I feel the patient has had an appropriate workup for their  chief complaint at this time and likelihood of emergent condition existing is low and thus workup can continue on an outpatient basis.. Their vital signs are stable. They have been counseled on decision, discharge, follow up and which symptoms necessitate immediate return to the emergency department.  They verbally stated understanding and agreement with plan and discharged in stable condition.   I personally performed the services described in this documentation, which was scribed in my presence. The recorded information has been reviewed and is accurate.      Thomas MemosJason Arno Cullers, MD 06/15/16 [id number]

## 2016-06-15 NOTE — ED Notes (Addendum)
Pt reports passing kidney stone. Small stone noted at bottom of urinal. Dr. Clayborne DanaMesner notified.

## 2016-06-15 NOTE — ED Notes (Signed)
Pt vomiting and dry heaves, mother at the bedside

## 2016-06-15 NOTE — ED Notes (Signed)
Pt called out to nurses station reporting that pain had come back. Dr. Clayborne DanaMesner notified.

## 2016-07-23 IMAGING — CT CT RENAL STONE PROTOCOL
2 of 3 series · 16 of 37 positions shown, 18 images · non-contrast
Comparison: CT of the abdomen and pelvis from 06/26/2015, and renal
ultrasound performed 12/24/2015

CLINICAL DATA: Acute onset of left flank pain, nausea and vomiting.
Microhematuria. Initial encounter.

EXAM:
CT ABDOMEN AND PELVIS WITHOUT CONTRAST
TECHNIQUE: Multidetector CT imaging of the abdomen and pelvis was performed
following the standard protocol without IV contrast.

[Series 3: lung · axial · 0.74mm/px · z∈[+1359,+1459]mm · 13 of 23 slices shown, 15 images]
[im 2/23  soft-tissue]
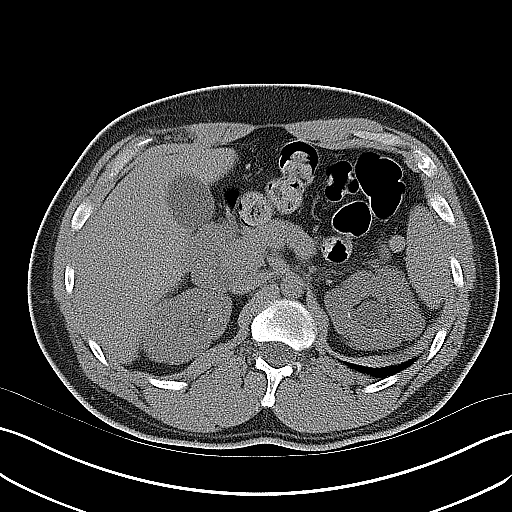
[im 2/23  bone]
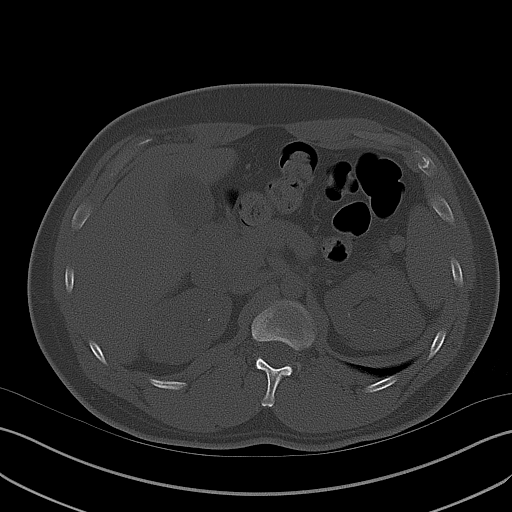
[im 4/23  soft-tissue]
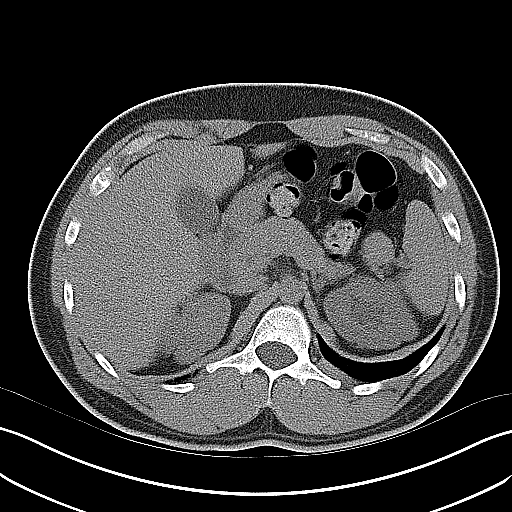
[im 6/23  soft-tissue]
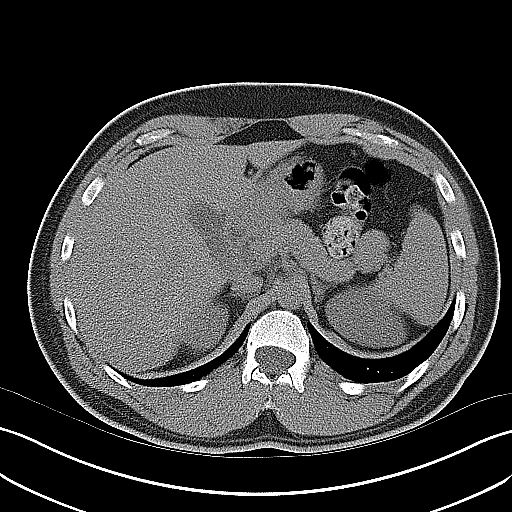
[im 7/23  soft-tissue]
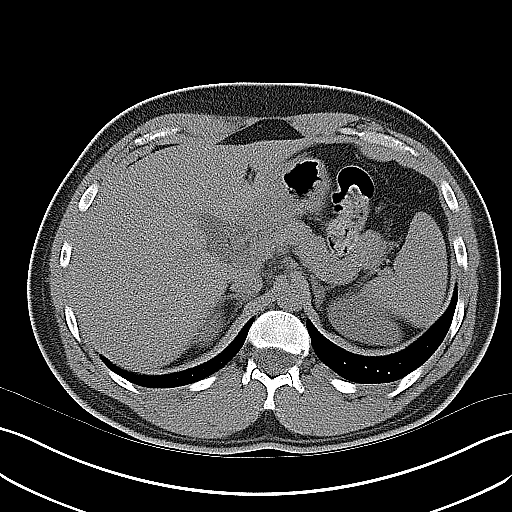
[im 9/23  soft-tissue]
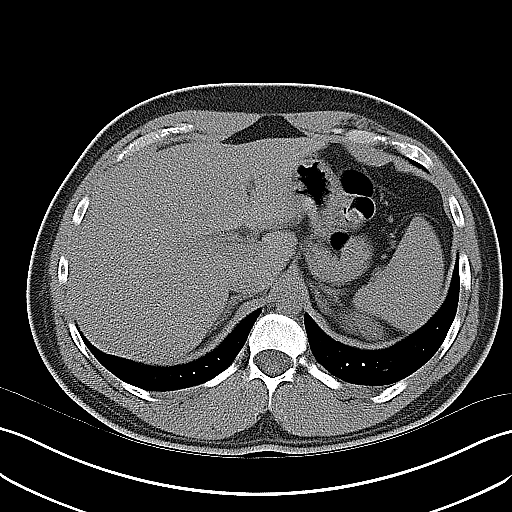
[im 10/23  soft-tissue]
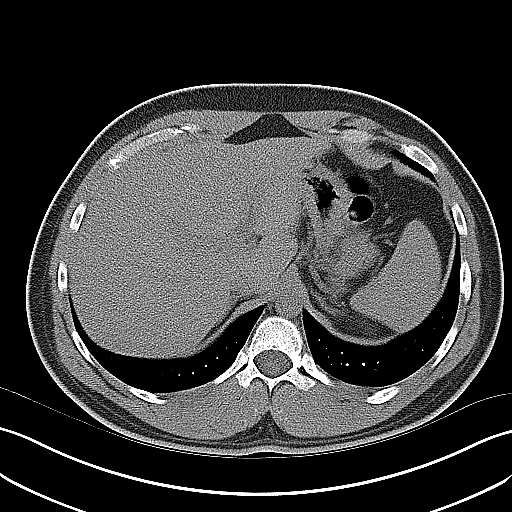
[im 12/23  soft-tissue]
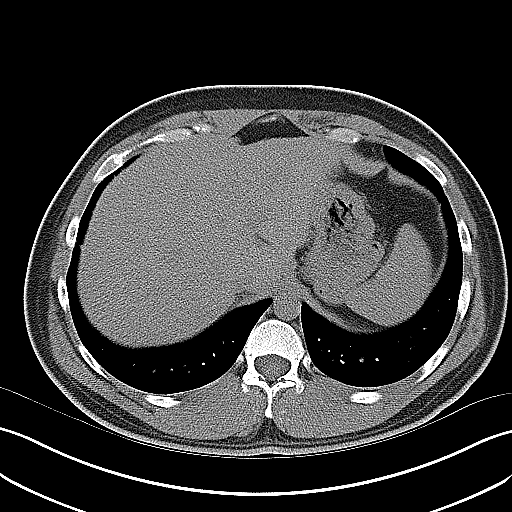
[im 14/23  soft-tissue]
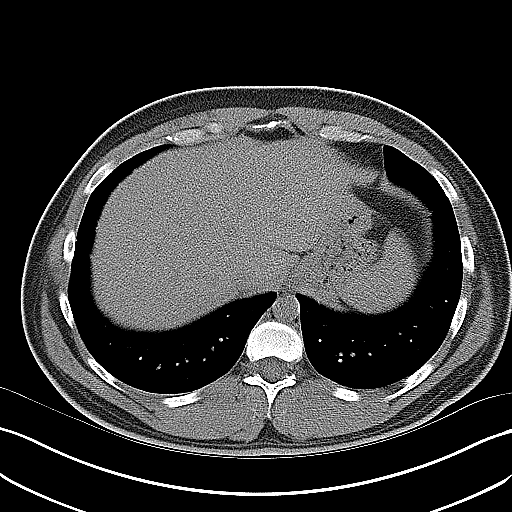
[im 15/23  soft-tissue]
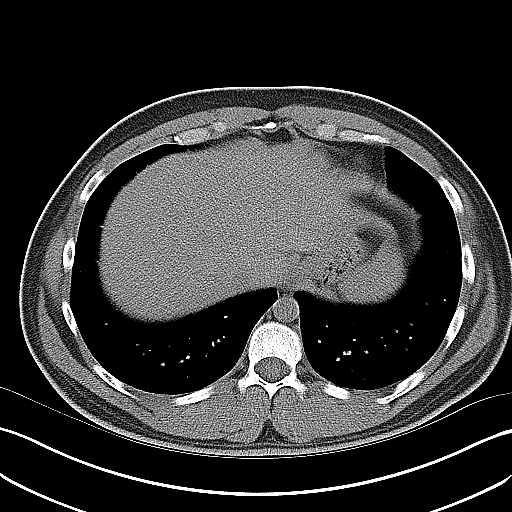
[im 15/23  bone]
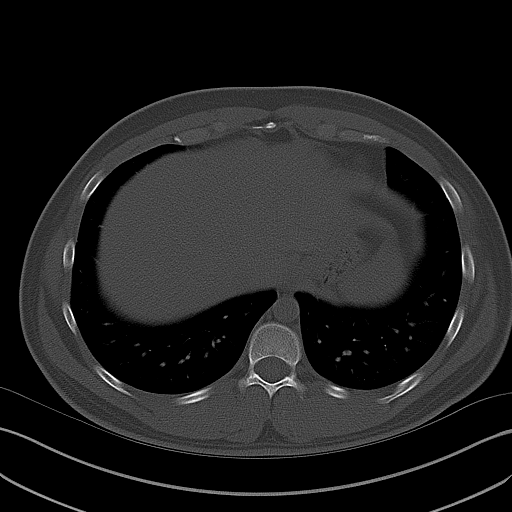
[im 17/23  soft-tissue]
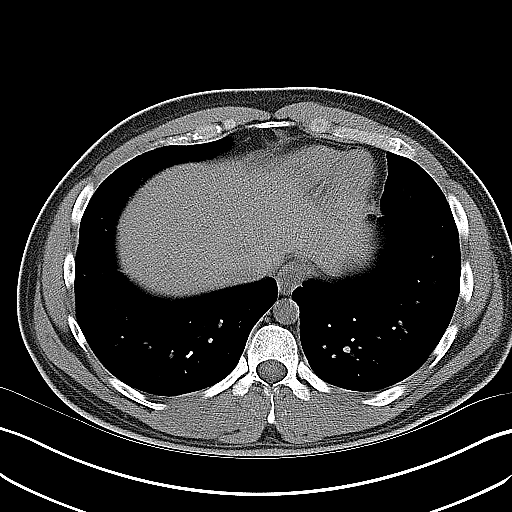
[im 18/23  soft-tissue]
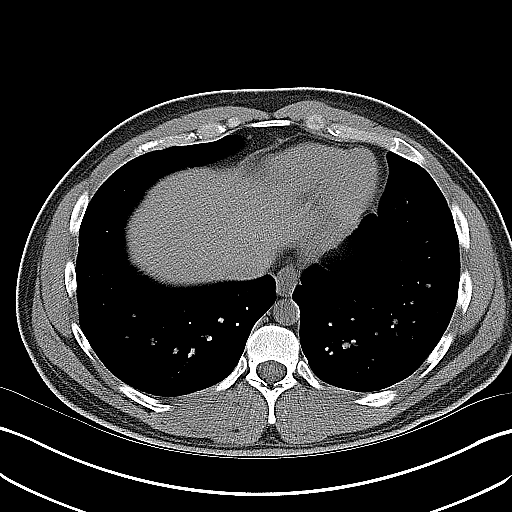
[im 20/23  soft-tissue]
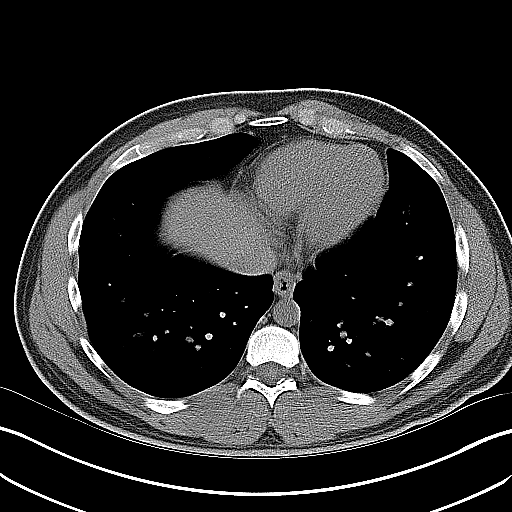
[im 22/23  soft-tissue]
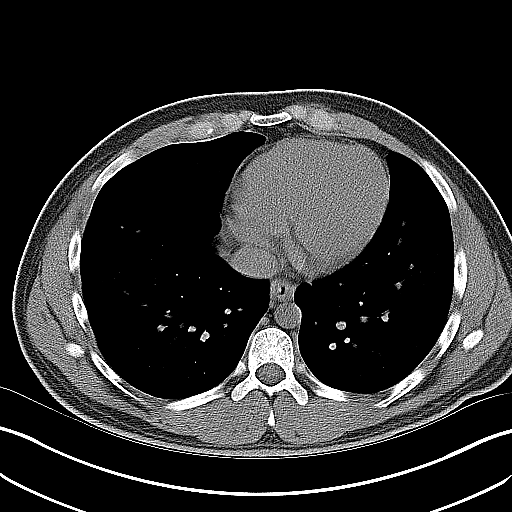

[Series 4: coronal · coronal · 0.73mm/px · 3 of 85 slices shown]
[im 29/85  soft-tissue]
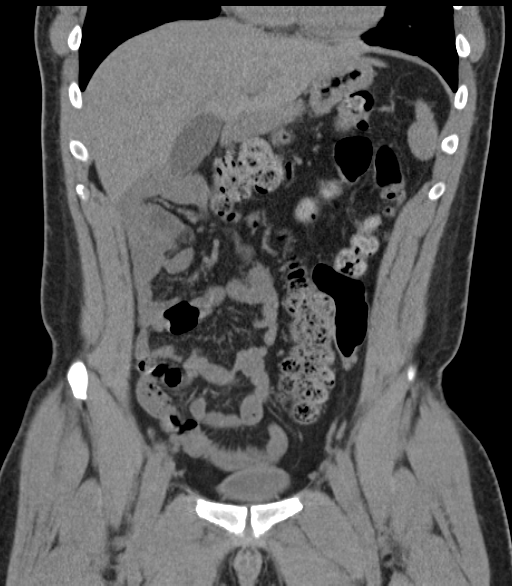
[im 38/85  soft-tissue]
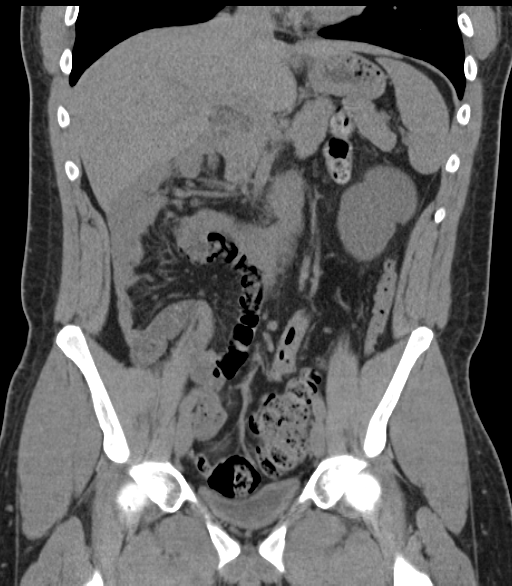
[im 47/85  soft-tissue]
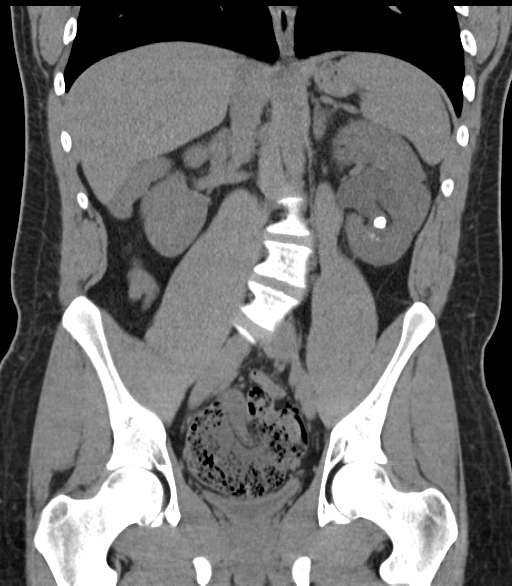

[16 of 37 positions shown; findings below may reference images not displayed]

FINDINGS: The visualized lung bases are clear.

The liver and spleen are unremarkable in appearance. The gallbladder
is within normal limits. The pancreas and adrenal glands are
unremarkable.

There is mild-to-moderate left-sided hydronephrosis, with an
obstructing 8 x 4 mm stone noted at the proximal to mid left ureter,
7 cm below the left renal pelvis. Mild left-sided perinephric
stranding is noted. Scattered nonobstructing bilateral renal stones
are noted, larger on the left, measuring up to 1.3 cm in size.

No free fluid is identified. The small bowel is situated entirely on
the right side of the abdomen, while the colon is on the left side,
compatible with developmental nonrotation. The stomach is within
normal limits. No acute vascular abnormalities are seen.

The appendix is normal in caliber, noted at the right hemipelvis.
The cecum is situated deep within the pelvis. The colon is noted on
the left side of the abdomen, and is unremarkable in appearance.

The bladder is mildly distended and grossly unremarkable. The
prostate remains normal in size. No inguinal lymphadenopathy is
seen.

No acute osseous abnormalities are identified. Mild left convex
lumbar scoliosis is noted.
IMPRESSION: 1. Mild-to-moderate left-sided hydronephrosis, with an obstructing 8
x 4 mm stone at the proximal to mid left ureter, 7 cm below the left
renal pelvis.
2. Scattered nonobstructing bilateral renal stones, larger on the
left, measuring up to 1.3 cm in size.
3. Small bowel noted entirely on the right side of the abdomen,
while the colon is on the left side, compatible with developmental
nonrotation.
4. Mild left convex lumbar scoliosis noted.

## 2016-07-26 ENCOUNTER — Encounter: Payer: Self-pay | Admitting: Emergency Medicine

## 2016-07-26 ENCOUNTER — Emergency Department
Admission: EM | Admit: 2016-07-26 | Discharge: 2016-07-26 | Disposition: A | Discharge status: Home / Self Care | Payer: Self-pay | Attending: Student in an Organized Health Care Education/Training Program | Admitting: Student in an Organized Health Care Education/Training Program

## 2016-07-26 ENCOUNTER — Emergency Department: Payer: Self-pay

## 2016-07-26 DIAGNOSIS — N202 Calculus of kidney with calculus of ureter: Secondary | ICD-10-CM | POA: Insufficient documentation

## 2016-07-26 DIAGNOSIS — R109 Unspecified abdominal pain: Secondary | ICD-10-CM | POA: Diagnosis not present

## 2016-07-26 DIAGNOSIS — Z79899 Other long term (current) drug therapy: Secondary | ICD-10-CM | POA: Insufficient documentation

## 2016-07-26 DIAGNOSIS — N132 Hydronephrosis with renal and ureteral calculous obstruction: Secondary | ICD-10-CM | POA: Diagnosis not present

## 2016-07-26 DIAGNOSIS — N201 Calculus of ureter: Secondary | ICD-10-CM | POA: Diagnosis not present

## 2016-07-26 DIAGNOSIS — N200 Calculus of kidney: Secondary | ICD-10-CM

## 2016-07-26 DIAGNOSIS — R319 Hematuria, unspecified: Secondary | ICD-10-CM

## 2016-07-26 DIAGNOSIS — Z7722 Contact with and (suspected) exposure to environmental tobacco smoke (acute) (chronic): Secondary | ICD-10-CM | POA: Insufficient documentation

## 2016-07-26 LAB — URINALYSIS COMPLETE WITH MICROSCOPIC (ARMC ONLY)
Bilirubin Urine: NEGATIVE
Glucose, UA: NEGATIVE mg/dL
Ketones, ur: NEGATIVE mg/dL
Leukocytes, UA: NEGATIVE
Nitrite: NEGATIVE
Protein, ur: NEGATIVE mg/dL
Specific Gravity, Urine: 1.008 (ref 1.005–1.030)
Squamous Epithelial / LPF: NONE SEEN
pH: 8 (ref 5.0–8.0)

## 2016-07-26 LAB — BASIC METABOLIC PANEL
Anion gap: 12 (ref 5–15)
BUN: 17 mg/dL (ref 6–20)
CO2: 22 mmol/L (ref 22–32)
Calcium: 9.8 mg/dL (ref 8.9–10.3)
Chloride: 103 mmol/L (ref 101–111)
Creatinine, Ser: 0.98 mg/dL (ref 0.61–1.24)
GFR calc Af Amer: >60 mL/min (ref >60)
GFR calc non Af Amer: >60 mL/min (ref >60)
Glucose, Bld: 104 mg/dL — ABNORMAL HIGH (ref 65–99)
Potassium: 3.7 mmol/L (ref 3.5–5.1)
Sodium: 137 mmol/L (ref 135–145)

## 2016-07-26 LAB — CBC
HCT: 49.6 % (ref 40.0–52.0)
Hemoglobin: 17.2 g/dL (ref 13.0–18.0)
MCH: 30.3 pg (ref 26.0–34.0)
MCHC: 34.7 g/dL (ref 32.0–36.0)
MCV: 87.4 fL (ref 80.0–100.0)
Platelets: 179 10*3/uL (ref 150–440)
RBC: 5.68 MIL/uL (ref 4.40–5.90)
RDW: 13.6 % (ref 11.5–14.5)
WBC: 6.5 10*3/uL (ref 3.8–10.6)

## 2016-07-26 MED ORDER — KETOROLAC TROMETHAMINE 30 MG/ML IJ SOLN
30.0000 mg | Freq: Once | INTRAMUSCULAR | Status: AC
  Administered 2016-07-26: 30 mg via INTRAVENOUS
  Filled 2016-07-26: qty 1

## 2016-07-26 MED ORDER — ONDANSETRON HCL 8 MG PO TABS: 8.0000 mg | ORAL_TABLET | Freq: Three times a day (TID) | ORAL | 0 refills | Status: DC | PRN

## 2016-07-26 MED ORDER — OXYCODONE-ACETAMINOPHEN 7.5-325 MG PO TABS: 1.0000 | ORAL_TABLET | ORAL | 0 refills | Status: DC | PRN

## 2016-07-26 MED ORDER — ONDANSETRON HCL 4 MG/2ML IJ SOLN
4.0000 mg | Freq: Once | INTRAMUSCULAR | Status: AC
  Administered 2016-07-26: 4 mg via INTRAVENOUS
  Filled 2016-07-26: qty 2

## 2016-07-26 MED ORDER — SODIUM CHLORIDE 0.9 % IV BOLUS (SEPSIS)
1000.0000 mL | Freq: Once | INTRAVENOUS | Status: AC
  Administered 2016-07-26: 1000 mL via INTRAVENOUS

## 2016-07-26 MED ORDER — HYDROMORPHONE HCL 1 MG/ML IJ SOLN
1.0000 mg | Freq: Once | INTRAMUSCULAR | Status: AC
  Administered 2016-07-26: 1 mg via INTRAVENOUS
  Filled 2016-07-26: qty 1

## 2016-07-26 NOTE — ED Triage Notes (Signed)
Presents with left flank pain today   Hx of kidney stones in past  Positive n/v

## 2016-07-26 NOTE — ED Provider Notes (Signed)
[facility] Emergency Department Provider Note   ____________________________________________   First MD Initiated Contact with Patient 07/26/16 [phone]     (approximate)  I have reviewed the triage vital signs and the nursing notes.   HISTORY  Chief Complaint Flank Pain    HPI Thomas Howell is a 26 y.o. male patient complaining of onset of left flank pain today. Patient state history of kidney stones and this feels another one has developed. Patient states nausea and vomiting with this episode. Patient rates his pain discomfortas 8/10. No palliative measures for his complaint. Patient is describing his pain as sharp.   Past Medical History:  Diagnosis Date  . Complication of anesthesia   . History of kidney stones   . Left nephrolithiasis   . Migraine   . PONV (postoperative nausea and vomiting)   . Scoliosis of lumbar spine 1994   treated at [facility] until age 68  . Wears glasses     Patient Active Problem List   Diagnosis Date Noted  . Neck pain 02/04/2016  . Hydronephrosis, left 08/19/2015  . Hydronephrosis determined by ultrasound   . Kidney stone on left side 08/18/2015  . Nephrolithiasis 08/18/2015  . Kidney stone 07/06/2015  . Left ureteral stone 07/06/2015  . Analgesic rebound headache 06/05/2014  . Migraine headache without aura 05/30/2014    Past Surgical History:  Procedure Laterality Date  . CYSTOSCOPY W/ RETROGRADES Right 07/06/2015   Procedure: CYSTOSCOPY WITH RETROGRADE PYELOGRAM;  Surgeon: Jerilee Field, MD;  Location: [facility];  Service: Urology;  Laterality: Right;  . CYSTOSCOPY W/ URETERAL STENT PLACEMENT Left 08/08/2015   Procedure: CYSTOSCOPY WITH STENT REPLACEMENT;  Surgeon: Jerilee Field, MD;  Location: [facility];  Service: Urology;  Laterality: Left;  . CYSTOSCOPY WITH RETROGRADE PYELOGRAM, URETEROSCOPY AND STENT PLACEMENT Left 06/24/2014   Procedure: CYSTOSCOPY WITH RETROGRADE PYELOGRAM,  AND  STENT PLACEMENT;  Surgeon: Magdalene Molly, MD;  Location: [facility];  Service: Urology;  Laterality: Left;  . CYSTOSCOPY WITH RETROGRADE PYELOGRAM, URETEROSCOPY AND STENT PLACEMENT Left 07/05/2014   Procedure: CYSTO/LEFT URETEROSCOPY/LEFT RETROGRADE PYELOGRAM/LEFT STENT PLACEMENT;  Surgeon: Jerilee Field, MD;  Location: [facility];  Service: Urology;  Laterality: Left;  . CYSTOSCOPY WITH RETROGRADE PYELOGRAM, URETEROSCOPY AND STENT PLACEMENT Left 07/06/2015   Procedure: CYSTOSCOPY WITH RETROGRADE PYELOGRAM, URETEROSCOPY , LASER, STENT PLACEMENT and BASKET EXTRACTION;  Surgeon: Jerilee Field, MD;  Location: [facility];  Service: Urology;  Laterality: Left;  . CYSTOSCOPY WITH RETROGRADE PYELOGRAM, URETEROSCOPY AND STENT PLACEMENT Left 08/19/2015   Procedure: CYSTOSCOPY WITH RETROGRADE PYELOGRAM, URETEROSCOPY AND STENT PLACEMENT;  Surgeon: Jerilee Field, MD;  Location: [facility];  Service: Urology;  Laterality: Left;  . CYSTOSCOPY WITH URETEROSCOPY AND STENT PLACEMENT Left 01/16/2016   Procedure: CYSTOSCOPY WITH LEFT RETROGRADE PYELOGRAM  LEFT DIGITAL URETEROSCOPY AND PLACEMENT LEFT URETERAL STENT;  Surgeon: Jerilee Field, MD;  Location: [facility];  Service: Urology;  Laterality: Left;  . CYSTOSCOPY WITH URETEROSCOPY, STONE BASKETRY AND STENT PLACEMENT Left 02/13/2016   Procedure: CYSTOSCOPY WITH LEFT URETEROSCOPY, HOLMIUM LASER AND STENT PLACEMENT;  Surgeon: Jerilee Field, MD;  Location: [facility];  Service: Urology;  Laterality: Left;  . CYSTOSCOPY/RETROGRADE/URETEROSCOPY/STONE EXTRACTION WITH BASKET Left 08/08/2015   Procedure: CYSTOSCOPY/RETROGRADE/URETEROSCOPY/STONE EXTRACTION WITH BASKET;  Surgeon: Jerilee Field, MD;  Location: [facility];  Service: Urology;  Laterality: Left;  . EXTRACORPOREAL SHOCK WAVE LITHOTRIPSY Left 07-07-2015  &  12-26-2014  . 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 HOLMIUM  LASER APPLICATION Left 07/05/2014   Procedure: LASER LITHO;  Surgeon: Jerilee FieldMatthew Eskridge, MD;  [facility];  Service: Urology;  Laterality: Left;  . HOLMIUM LASER APPLICATION Left 08/08/2015   Procedure: HOLMIUM LASER  WITH LITHOTRIPSY ;  Surgeon: Jerilee FieldMatthew Eskridge, MD;  [facility];  Service: Urology;  Laterality: Left;  . LYMPH GLAND EXCISION  2003   neck--  benign    Prior to Admission medications   Medication Sig Start Date End Date Taking? Authorizing Provider  levETIRAcetam (KEPPRA) 750 MG tablet Take 750-1,500 mg by mouth 2 (two) times daily. One tablet in the morning and two tablets at bedtime    Historical Provider, MD  ondansetron (ZOFRAN) 8 MG tablet Take 1 tablet (8 mg total) by mouth every 8 (eight) hours as needed for nausea or vomiting. 07/26/16   Joni Reiningonald K Eh Sesay, PA-C  oxyCODONE-acetaminophen (PERCOCET) 7.5-325 MG tablet Take 1 tablet by mouth every 4 (four) hours as needed for severe pain. 07/26/16 07/26/17  Joni Reiningonald K Ebonee Stober, PA-C  rizatriptan (MAXALT-MLT) 10 MG disintegrating tablet Take 1 tablet (10 mg total) by mouth as needed for migraine. May repeat in 2 hours if needed; max 2 per 24 hrs 04/09/16   Asa Lenteichard A Sater, MD    Allergies Codeine and Adhesive [tape]  Family History  Problem Relation Age of Onset  . Cancer Father   . Kidney Stones Mother   . Cancer Other   . Hypertension Other   . Hyperlipidemia Other   . Stroke Other   . Kidney Stones Brother     Social History Social History  Substance Use Topics  . Smoking status: Passive Smoke Exposure - Never Smoker    Years: 2.00  . Smokeless tobacco: Never Used  . Alcohol use 0.0 oz/week     Comment: occ    Review of Systems Constitutional: No fever/chills Eyes: No visual changes. ENT: No sore throat. Cardiovascular: Denies chest pain. Respiratory: Denies shortness of breath. Gastrointestinal: No abdominal pain.  No nausea, no vomiting.  No diarrhea.  No  constipation. Genitourinary: Negative for dysuria.  Musculoskeletal: Left flank pain  Skin: Negative for rash. Neurological: Negative for headaches, focal weakness or numbness. Allergic/Immunilogical: See medication list ____________________________________________   PHYSICAL EXAM:  VITAL SIGNS: ED Triage Vitals  Enc Vitals Group     BP 07/26/16 1608 (!) 146/84     Pulse Rate 07/26/16 1608 (!) 46     Resp 07/26/16 1608 (!) 26     Temp 07/26/16 1608 98 F (36.7 C)     Temp Source 07/26/16 1608 Oral     SpO2 07/26/16 1608 98 %     Weight 07/26/16 1608 200 lb (90.7 kg)     Height 07/26/16 1608 5\' 8"  (1.727 m)     Head Circumference --      Peak Flow --      Pain Score 07/26/16 1617 8     Pain Loc --      Pain Edu? --      Excl. in GC? --     Constitutional: Alert and oriented. Moderate distress Eyes: Conjunctivae are normal. PERRL. EOMI. Head: Atraumatic. Nose: No congestion/rhinnorhea. Mouth/Throat: Mucous membranes are moist.  Oropharynx non-erythematous. Neck: No stridor.  No cervical spine tenderness to palpation. Hematological/Lymphatic/Immunilogical: No cervical lymphadenopathy. Cardiovascular: Normal rate, regular rhythm. Grossly normal heart sounds.  Good peripheral circulation. Bradycardic will retake heart rate. Respiratory: Normal respiratory effort.  No  retractions. Lungs CTAB. Gastrointestinal: Soft and nontender. No distention. No abdominal bruits. Left CVA tenderness. Musculoskeletal: No lower extremity tenderness nor edema.  No joint effusions. Neurologic:  Normal speech and language. No gross focal neurologic deficits are appreciated. No gait instability. Skin:  Skin is warm, dry and intact. No rash noted. Psychiatric: Mood and affect are normal. Speech and behavior are normal.  ____________________________________________   LABS (all labs ordered are listed, but only abnormal results are displayed)  Labs Reviewed  URINALYSIS COMPLETEWITH MICROSCOPIC  ([facility] ONLY) - Abnormal; Notable for the following:       Result Value   Color, Urine STRAW (*)    APPearance CLEAR (*)    Hgb urine dipstick 2+ (*)    Bacteria, UA RARE (*)    All other components within normal limits  BASIC METABOLIC PANEL - Abnormal; Notable for the following:    Glucose, Bld 104 (*)    All other components within normal limits  CBC   ____________________________________________  EKG   ____________________________________________  RADIOLOGY  CT revealed to obstructed left ureteral calcui with moderate left hydroureteronephrosis. Numerous bilateral renal calculi. ____________________________________________   PROCEDURES  Procedure(s) performed: None  Procedures  Critical Care performed: No  ____________________________________________   INITIAL IMPRESSION / ASSESSMENT AND PLAN / ED COURSE  Pertinent labs & imaging results that were available during my care of the patient were reviewed by me and considered in my medical decision making (see chart for details).  Left obstructing left ureteral calculi. Discuss CT findings with patient. Patient will follow-up with treating neurologist by calling for an appointment tomorrow. Patient given a prescription for Percocet and Zofran.  Clinical Course     ____________________________________________   FINAL CLINICAL IMPRESSION(S) / ED DIAGNOSES  Final diagnoses:  Nephrolithiasis  Ureterolithiasis  Hematuria  Left flank pain      NEW MEDICATIONS STARTED DURING THIS VISIT:  New Prescriptions   ONDANSETRON (ZOFRAN) 8 MG TABLET    Take 1 tablet (8 mg total) by mouth every 8 (eight) hours as needed for nausea or vomiting.   OXYCODONE-ACETAMINOPHEN (PERCOCET) 7.5-325 MG TABLET    Take 1 tablet by mouth every 4 (four) hours as needed for severe pain.     Note:  This document was prepared using Dragon voice recognition software and may include unintentional dictation errors.    Joni Reining,  PA-C 07/26/16 2003    Willy Eddy, MD 07/26/16 [phone]

## 2016-07-29 ENCOUNTER — Encounter (HOSPITAL_BASED_OUTPATIENT_CLINIC_OR_DEPARTMENT_OTHER): Payer: Self-pay | Admitting: *Deleted

## 2016-07-29 ENCOUNTER — Other Ambulatory Visit: Payer: Self-pay | Admitting: Urology

## 2016-07-29 DIAGNOSIS — N202 Calculus of kidney with calculus of ureter: Secondary | ICD-10-CM | POA: Diagnosis not present

## 2016-07-29 NOTE — H&P (Signed)
@media  print    Office Visit Report 07/29/2016    Thomas Howell         MRN: [id number]  PRIMARY CARE:  Donna BernardW Stephen Luking, MD  DOB: [birth year], 25 year old Male  REFERRING:  Vanetta MuldersScott Zackowski  SSN: [id number]  PROVIDER:  Jerilee FieldMatthew Olen Eaves, M.D.    LOCATION:  [facility] [phone]    CC: I have kidney stones.  HPI: Thomas Howell is a 25 year-old male established patient who is here for renal calculi.  The problem is on the left side. He is currently having flank pain. He denies having back pain, groin pain, nausea, vomiting, fever, and chills. He has not caught a stone in his urine strainer since his symptoms began.   He has had eswl and ureteroscopy for treatment of his stones in the past.   Patient with July 26, 2016 CT showed a 6 mm stone in junction of left proximal/mid ureter and a smaller 4 mm stone above this. There was a 6 mm left lower pole stone. A June 27 CT revealed an 11 mm left lower pole stone and a 3 mm left proximal. The patient passed this stone in June. Urine showed rare bacteria with 6-30 red cells. He has no dysuria or fever.      ALLERGIES: Adhesive tape Codeine Derivatives Hycodan SYRP Hydrocodone-Acetaminophen CAPS    MEDICATIONS: Percocet  Aleve TABS Oral  Keppra TABS Oral  Maxalt  Zofran      GU PSH: Cysto Uretero Lithotripsy - 08/19/2015, 07/17/2015, 2015 Cysto/Uretero W/Lithotripsy - 02/18/2016 Cystoscopy Insert Stent - 01/19/2016, 08/20/2015, 08/19/2015, 07/17/2015, 2015, 2015 Cystoscopy Ureteroscopy - 01/19/2016, 08/20/2015 Remove Pelvic Lymph Nodes - 2012 Renal ESWL - 07/08/2015, 2016       PSH Notes: Cystoscopy Ureteroscopy Lithotripsy Incl Insert Indwelling Ureter Stent, Cystoscopy With Insertion Of Ureteral Stent Left, Cystoscopy With Ureteroscopy Left, Cystoscopy With Ureteroscopy Left, Cystoscopy With Insertion Of Ureteral Stent Left, Cystoscopy With Insertion Of Ureteral Stent Left, Cystoscopy With Ureteroscopy With Lithotripsy,  Cystoscopy With Ureteroscopy With Lithotripsy, Cystoscopy With Insertion Of Ureteral Stent Left, Lithotripsy, Lithotripsy, Cystoscopy With Ureteroscopy With Lithotripsy, Cystoscopy With Insertion Of Ureteral Stent Left, Cystoscopy With Insertion Of Ureteral Stent Left, Lymphadenectomy, Foot Surgery    NON-GU PSH: No Non-GU PSH            GU PMH: Kidney Stone, Kidney stone on left side - 02/27/2016, Nephrolithiasis, - 02/06/2016 Hydronephrosis Unspec, Hydronephrosis, left - 02/06/2016 Other microscopic hematuria, Microscopic hematuria - 02/06/2016 Calculus Ureter, Calculus of left ureter - 01/16/2016 Gross hematuria, Gross hematuria - 12/24/2015      NON-GU PMH: Personal history of other diseases of the nervous system and sense organs, History of migraine headaches - 02/06/2016 Encounter for general adult medical examination without abnormal findings, Encounter for preventive health examination - 12/24/2015      FAMILY HISTORY: Blood In Urine - Mother, Brother Family Health Status Number - Runs In Family nephrolithiasis - Runs In Family     SOCIAL HISTORY: No Social History       Notes: Current smoker on some days, Alcohol Use, Marital History - Single, Tobacco Use, Caffeine Use, Occupation:     REVIEW OF SYSTEMS:       GU Review Male:  Patient denies frequent urination, hard to postpone urination, burning/ pain with urination, get up at night to urinate, leakage of urine, stream starts and stops, trouble starting your stream, have to strain to urinate , erection problems, and penile pain.  Gastrointestinal (Upper):  Patient reports indigestion/ heartburn, nausea, and vomiting.       Gastrointestinal (Lower):  Patient denies diarrhea and constipation.      Constitutional:  Patient reports fatigue. Patient denies fever, night sweats, and weight loss.      Skin:  Patient denies skin rash/ lesion and itching.      Eyes:  Patient denies blurred vision and double vision.      Ears/ Nose/ Throat:   Patient denies sore throat and sinus problems.      Hematologic/Lymphatic:  Patient denies swollen glands and easy bruising.      Cardiovascular:  Patient denies leg swelling and chest pains.      Respiratory:  Patient denies cough and shortness of breath.      Endocrine:  Patient denies excessive thirst.      Musculoskeletal:  Patient reports back pain. Patient denies joint pain.      Neurological:  Patient reports headaches and dizziness.       Psychologic:  Patient denies depression and anxiety.      VITAL SIGNS:         07/29/2016 11:20 AM       BP 124/81 mmHg       Pulse 66 /min       Temperature 97.5 F / 36 C       MULTI-SYSTEM PHYSICAL EXAMINATION:        Constitutional: Well-nourished. No physical deformities. Normally developed. Good grooming.       Neck: Neck symmetrical, not swollen. Normal tracheal position.       Respiratory: No labored breathing, no use of accessory muscles.        Cardiovascular: Normal temperature, normal extremity pulses, no swelling, no varicosities.       Skin: No paleness, no jaundice, no cyanosis. No lesion, no ulcer, no rash.       Neurologic / Psychiatric: Oriented to time, oriented to place, oriented to person. No depression, no anxiety, no agitation.                PAST DATA REVIEWED:   Source Of History:  Patient  Records Review:  Previous Doctor Records  X-Ray Review: C.T. Abdomen/Pelvis: Reviewed Films. x2    PROCEDURES: None   ASSESSMENT:     ICD-10 Details  1 GU:  Kidney Stone - N20.0   2  Calculus Ureter - N20.1    PLAN:   Medications  New Meds: Promethazine Hcl 25 mg tablet 1/2-1 tablet PO Q 8 H PRN #14 0 Refill(s)    Document  Letter(s):  Created for Patient: Clinical Summary   Notes:  left proximal ureteral stones and left lower pole stone -- discussed with patient nature risks benefits and alternatives to left ureteroscopy. He said he cannot tolerate stone passage due to pain and nausea. We discussed importance of  follow-up after stone free to check for continued obstruction from proximal ureter which might be promoting stone formation. Discussed he may need a staged procedure. He elects to proceed.   cc: Dr. Gerda Diss     The information contained in this medical record document is considered private and confidential patient information. This information can only be used for the medical diagnosis and/or medical services that are being provided by the patient's selected caregivers. This information can only be distributed outside of the patient's care if the patient agrees and signs waivers of authorization for this information to be sent to an outside source or route.

## 2016-07-29 NOTE — Progress Notes (Signed)
NPO AFTER MN.  ARRIVE AT 0600.  CURRENT LAB RESULTS IN CHART AND EPIC.   WILL TAKE KEPPRA AM DOS W/ SIPS OF WATER AND IF NEEDED TAKE PAIN/ NAUSEA  MEDS.

## 2016-07-30 ENCOUNTER — Ambulatory Visit (HOSPITAL_BASED_OUTPATIENT_CLINIC_OR_DEPARTMENT_OTHER)
Admission: RE | Admit: 2016-07-30 | Discharge: 2016-07-30 | Disposition: A | Discharge status: Home / Self Care | Payer: BLUE CROSS/BLUE SHIELD | Source: Ambulatory Visit | Attending: Urology | Admitting: Urology

## 2016-07-30 ENCOUNTER — Ambulatory Visit (HOSPITAL_BASED_OUTPATIENT_CLINIC_OR_DEPARTMENT_OTHER): Payer: BLUE CROSS/BLUE SHIELD | Admitting: Anesthesiology

## 2016-07-30 ENCOUNTER — Encounter (HOSPITAL_BASED_OUTPATIENT_CLINIC_OR_DEPARTMENT_OTHER)
Admission: RE | Disposition: A | Discharge status: Home / Self Care | Payer: Self-pay | Source: Ambulatory Visit | Attending: Urology

## 2016-07-30 ENCOUNTER — Encounter (HOSPITAL_BASED_OUTPATIENT_CLINIC_OR_DEPARTMENT_OTHER): Payer: Self-pay | Admitting: *Deleted

## 2016-07-30 DIAGNOSIS — E669 Obesity, unspecified: Secondary | ICD-10-CM | POA: Diagnosis not present

## 2016-07-30 DIAGNOSIS — Z6830 Body mass index (BMI) 30.0-30.9, adult: Secondary | ICD-10-CM | POA: Insufficient documentation

## 2016-07-30 DIAGNOSIS — Z87442 Personal history of urinary calculi: Secondary | ICD-10-CM | POA: Diagnosis not present

## 2016-07-30 DIAGNOSIS — N202 Calculus of kidney with calculus of ureter: Secondary | ICD-10-CM | POA: Insufficient documentation

## 2016-07-30 DIAGNOSIS — N135 Crossing vessel and stricture of ureter without hydronephrosis: Secondary | ICD-10-CM | POA: Insufficient documentation

## 2016-07-30 DIAGNOSIS — Z79899 Other long term (current) drug therapy: Secondary | ICD-10-CM | POA: Insufficient documentation

## 2016-07-30 HISTORY — DX: Calculus of ureter: N20.1

## 2016-07-30 HISTORY — DX: Calculus of kidney: N20.0

## 2016-07-30 HISTORY — PX: CYSTOSCOPY/URETEROSCOPY/HOLMIUM LASER/STENT PLACEMENT: SHX6546

## 2016-07-30 SURGERY — CYSTOSCOPY/URETEROSCOPY/HOLMIUM LASER/STENT PLACEMENT
Anesthesia: General | Site: Ureter | Laterality: Left

## 2016-07-30 MED ORDER — CEFAZOLIN IN D5W 1 GM/50ML IV SOLN
1.0000 g | INTRAVENOUS | Status: DC
  Filled 2016-07-30: qty 50

## 2016-07-30 MED ORDER — FENTANYL CITRATE (PF) 100 MCG/2ML IJ SOLN
25.0000 ug | INTRAMUSCULAR | Status: DC | PRN
  Filled 2016-07-30: qty 1

## 2016-07-30 MED ORDER — LIDOCAINE HCL 2 % EX GEL
CUTANEOUS | Status: DC | PRN
  Administered 2016-07-30: 1

## 2016-07-30 MED ORDER — CEFAZOLIN SODIUM-DEXTROSE 2-4 GM/100ML-% IV SOLN
2.0000 g | INTRAVENOUS | Status: AC
  Administered 2016-07-30: 2 g via INTRAVENOUS
  Filled 2016-07-30: qty 100

## 2016-07-30 MED ORDER — CEFAZOLIN SODIUM-DEXTROSE 2-4 GM/100ML-% IV SOLN
INTRAVENOUS | Status: AC
  Filled 2016-07-30: qty 100

## 2016-07-30 MED ORDER — PROPOFOL 10 MG/ML IV BOLUS
INTRAVENOUS | Status: AC
  Filled 2016-07-30: qty 40

## 2016-07-30 MED ORDER — ONDANSETRON HCL 4 MG/2ML IJ SOLN
INTRAMUSCULAR | Status: AC
  Filled 2016-07-30: qty 2

## 2016-07-30 MED ORDER — ONDANSETRON HCL 4 MG/2ML IJ SOLN
INTRAMUSCULAR | Status: DC | PRN
  Administered 2016-07-30: 4 mg via INTRAVENOUS

## 2016-07-30 MED ORDER — IOHEXOL 300 MG/ML  SOLN
INTRAMUSCULAR | Status: DC | PRN
  Administered 2016-07-30: 50 mL

## 2016-07-30 MED ORDER — LIDOCAINE HCL (CARDIAC) 20 MG/ML IV SOLN
INTRAVENOUS | Status: AC
Start: 2016-07-30 — End: 2016-07-30
  Filled 2016-07-30: qty 5

## 2016-07-30 MED ORDER — MIDAZOLAM HCL 2 MG/2ML IJ SOLN
INTRAMUSCULAR | Status: AC
  Filled 2016-07-30: qty 2

## 2016-07-30 MED ORDER — STERILE WATER FOR IRRIGATION IR SOLN
Status: DC | PRN
  Administered 2016-07-30: 500 mL

## 2016-07-30 MED ORDER — FENTANYL CITRATE (PF) 100 MCG/2ML IJ SOLN
INTRAMUSCULAR | Status: DC | PRN
Start: 2016-07-30 — End: 2016-07-30
  Administered 2016-07-30: 25 ug via INTRAVENOUS
  Administered 2016-07-30 (×2): 50 ug via INTRAVENOUS

## 2016-07-30 MED ORDER — CEPHALEXIN 500 MG PO CAPS: 500.0000 mg | ORAL_CAPSULE | Freq: Two times a day (BID) | ORAL | 0 refills | Status: DC

## 2016-07-30 MED ORDER — MIDAZOLAM HCL 5 MG/5ML IJ SOLN
INTRAMUSCULAR | Status: DC | PRN
  Administered 2016-07-30: 2 mg via INTRAVENOUS

## 2016-07-30 MED ORDER — FENTANYL CITRATE (PF) 100 MCG/2ML IJ SOLN
INTRAMUSCULAR | Status: AC
  Filled 2016-07-30: qty 2

## 2016-07-30 MED ORDER — DEXAMETHASONE SODIUM PHOSPHATE 10 MG/ML IJ SOLN
INTRAMUSCULAR | Status: AC
  Filled 2016-07-30: qty 1

## 2016-07-30 MED ORDER — SODIUM CHLORIDE 0.9 % IR SOLN
Status: DC | PRN
  Administered 2016-07-30: 1000 mL
  Administered 2016-07-30: 3000 mL

## 2016-07-30 MED ORDER — PROPOFOL 10 MG/ML IV BOLUS
INTRAVENOUS | Status: DC | PRN
  Administered 2016-07-30: 200 mg via INTRAVENOUS

## 2016-07-30 MED ORDER — LACTATED RINGERS IV SOLN
INTRAVENOUS | Status: DC
  Administered 2016-07-30: 06:00:00 via INTRAVENOUS
  Filled 2016-07-30: qty 1000

## 2016-07-30 MED ORDER — KETOROLAC TROMETHAMINE 30 MG/ML IJ SOLN
INTRAMUSCULAR | Status: DC | PRN
  Administered 2016-07-30: 30 mg via INTRAVENOUS

## 2016-07-30 MED ORDER — LIDOCAINE 2% (20 MG/ML) 5 ML SYRINGE
INTRAMUSCULAR | Status: DC | PRN
  Administered 2016-07-30: 100 mg via INTRAVENOUS

## 2016-07-30 MED ORDER — PROMETHAZINE HCL 25 MG/ML IJ SOLN
6.2500 mg | INTRAMUSCULAR | Status: DC | PRN
  Filled 2016-07-30: qty 1

## 2016-07-30 MED ORDER — KETOROLAC TROMETHAMINE 30 MG/ML IJ SOLN
INTRAMUSCULAR | Status: AC
  Filled 2016-07-30: qty 1

## 2016-07-30 MED ORDER — DEXAMETHASONE SODIUM PHOSPHATE 4 MG/ML IJ SOLN
INTRAMUSCULAR | Status: DC | PRN
  Administered 2016-07-30: 10 mg via INTRAVENOUS

## 2016-07-30 SURGICAL SUPPLY — 40 items
ADAPTER CATH URET PLST 4-6FR (CATHETERS) IMPLANT
BAG DRAIN URO-CYSTO SKYTR STRL (DRAIN) ×2 IMPLANT
BASKET LASER NITINOL 1.9FR (BASKET) IMPLANT
BASKET STNLS GEMINI 4WIRE 3FR (BASKET) IMPLANT
BASKET STONE 1.7 NGAGE (UROLOGICAL SUPPLIES) ×2 IMPLANT
BASKET ZERO TIP NITINOL 2.4FR (BASKET) ×2 IMPLANT
CATH INTERMIT  6FR 70CM (CATHETERS) ×2 IMPLANT
CATH URET 5FR 28IN CONE TIP (BALLOONS) ×1
CATH URET 5FR 28IN OPEN ENDED (CATHETERS) IMPLANT
CATH URET 5FR 70CM CONE TIP (BALLOONS) ×1 IMPLANT
CATH URET DUAL LUMEN 6-10FR 50 (CATHETERS) IMPLANT
CLOTH BEACON ORANGE TIMEOUT ST (SAFETY) ×2 IMPLANT
ELECT REM PT RETURN 9FT ADLT (ELECTROSURGICAL)
ELECTRODE REM PT RTRN 9FT ADLT (ELECTROSURGICAL) IMPLANT
FIBER LASER LITHO 273 (Laser) ×2 IMPLANT
GLOVE BIO SURGEON STRL SZ 6.5 (GLOVE) ×2 IMPLANT
GLOVE BIO SURGEON STRL SZ7.5 (GLOVE) ×2 IMPLANT
GLOVE INDICATOR 6.5 STRL GRN (GLOVE) ×4 IMPLANT
GOWN STRL REUS W/ TWL LRG LVL3 (GOWN DISPOSABLE) ×1 IMPLANT
GOWN STRL REUS W/ TWL XL LVL3 (GOWN DISPOSABLE) ×1 IMPLANT
GOWN STRL REUS W/TWL LRG LVL3 (GOWN DISPOSABLE) ×1
GOWN STRL REUS W/TWL XL LVL3 (GOWN DISPOSABLE) ×1
GUIDEWIRE 0.038 PTFE COATED (WIRE) IMPLANT
GUIDEWIRE ANG ZIPWIRE 038X150 (WIRE) ×2 IMPLANT
GUIDEWIRE STR DUAL SENSOR (WIRE) ×2 IMPLANT
IV NS 1000ML (IV SOLUTION) ×1
IV NS 1000ML BAXH (IV SOLUTION) ×1 IMPLANT
IV NS IRRIG 3000ML ARTHROMATIC (IV SOLUTION) ×2 IMPLANT
KIT BALLIN UROMAX 15FX10 (LABEL) IMPLANT
KIT BALLN UROMAX 15FX4 (MISCELLANEOUS) IMPLANT
KIT BALLN UROMAX 26 75X4 (MISCELLANEOUS)
KIT ROOM TURNOVER WOR (KITS) ×2 IMPLANT
MANIFOLD NEPTUNE II (INSTRUMENTS) ×2 IMPLANT
PACK CYSTO (CUSTOM PROCEDURE TRAY) ×2 IMPLANT
SET HIGH PRES BAL DIL (LABEL)
SHEATH ACCESS URETERAL 38CM (SHEATH) ×2 IMPLANT
STENT URET 6FRX26 CONTOUR (STENTS) ×2 IMPLANT
SYRINGE IRR TOOMEY STRL 70CC (SYRINGE) IMPLANT
TUBE CONNECTING 12X1/4 (SUCTIONS) IMPLANT
WATER STERILE IRR 500ML POUR (IV SOLUTION) ×2 IMPLANT

## 2016-07-30 NOTE — Anesthesia Procedure Notes (Signed)
Procedure Name: LMA Insertion Date/Time: 07/30/2016 7:38 AM Performed by: Maris BergerENENNY, Emery Binz T Pre-anesthesia Checklist: Patient identified, Emergency Drugs available, Suction available and Patient being monitored Patient Re-evaluated:Patient Re-evaluated prior to inductionOxygen Delivery Method: Circle system utilized Preoxygenation: Pre-oxygenation with 100% oxygen Intubation Type: IV induction Ventilation: Mask ventilation without difficulty LMA: LMA inserted LMA Size: 5.0 Number of attempts: 1 Airway Equipment and Method: Bite block Placement Confirmation: positive ETCO2 and breath sounds checked- equal and bilateral Dental Injury: Teeth and Oropharynx as per pre-operative assessment

## 2016-07-30 NOTE — Interval H&P Note (Signed)
History and Physical Interval Note:  07/30/2016 7:29 AM  Sheliah Mendsussell K Odonnel  has presented today for surgery, with the diagnosis of LEFT URETERAL AND RENAL STONES  The various methods of treatment have been discussed with the patient and family. After consideration of risks, benefits and other options for treatment, the patient has consented to  Procedure(s): CYSTOSCOPY/URETEROSCOPY/HOLMIUM LASER/STENT PLACEMENT (Left) as a surgical intervention .  The patient's history has been reviewed, patient examined, no change in status, stable for surgery. He continues to have left flank pain and has not passed a stone. I again discussed importance of f/u and the 24 hr urine (which he reports he has not done).  I have reviewed the patient's chart and labs.  Questions were answered to the patient's satisfaction.  May need stent/staged procedure.    Ladiamond Gallina

## 2016-07-30 NOTE — Anesthesia Preprocedure Evaluation (Addendum)
Anesthesia Evaluation  Patient identified by MRN, date of birth, ID band Patient awake    Reviewed: Allergy & Precautions, NPO status , Patient's Chart, lab work & pertinent test results  History of Anesthesia Complications (+) PONV and history of anesthetic complications  Airway Mallampati: II  TM Distance: >3 FB Neck ROM: Full    Dental no notable dental hx.    Pulmonary neg pulmonary ROS, former smoker,    Pulmonary exam normal breath sounds clear to auscultation       Cardiovascular negative cardio ROS Normal cardiovascular exam Rhythm:Regular Rate:Normal     Neuro/Psych  Headaches, negative psych ROS   GI/Hepatic negative GI ROS, Neg liver ROS,   Endo/Other  negative endocrine ROS  Renal/GU Renal diseaseKidney stones  negative genitourinary   Musculoskeletal negative musculoskeletal ROS (+)   Abdominal (+) + obese,   Peds negative pediatric ROS (+)  Hematology negative hematology ROS (+)   Anesthesia Other Findings   Reproductive/Obstetrics negative OB ROS                             Anesthesia Physical Anesthesia Plan  ASA: II  Anesthesia Plan: General   Post-op Pain Management:    Induction: Intravenous  Airway Management Planned: LMA  Additional Equipment:   Intra-op Plan:   Post-operative Plan: Extubation in OR  Informed Consent: I have reviewed the patients History and Physical, chart, labs and discussed the procedure including the risks, benefits and alternatives for the proposed anesthesia with the patient or authorized representative who has indicated his/her understanding and acceptance.   Dental advisory given  Plan Discussed with: CRNA  Anesthesia Plan Comments: (LMA 5 last time)       Anesthesia Quick Evaluation

## 2016-07-30 NOTE — Transfer of Care (Signed)
Immediate Anesthesia Transfer of Care Note  Patient: Thomas Howell  Procedure(s) Performed: Procedure(s): CYSTOSCOPY/URETEROSCOPY/HOLMIUM LASER/STENT PLACEMENT (Left)  Patient Location: PACU  Anesthesia Type:General  Level of Consciousness: sedated and responds to stimulation  Airway & Oxygen Therapy: Patient Spontanous Breathing and Patient connected to nasal cannula oxygen  Post-op Assessment: Report given to RN  Post vital signs: Reviewed and stable  Last Vitals: 121/77, 71, 13, 100%, 97.8  Vitals:   07/30/16 0600  BP: 125/77  Pulse: 74  Resp: 16  Temp: 37.2 C    Last Pain:  Vitals:   07/30/16 0609  TempSrc:   PainSc: 8       Patients Stated Pain Goal: 8 (07/30/16 0609)  Complications: No apparent anesthesia complications

## 2016-07-30 NOTE — Op Note (Signed)
Preoperative diagnosis: Left proximal ureteral stones, left lower pole stone, left lower pole diverticulum, left proximal ureteral narrowing at L4 Postoperative diagnosis: Same  Procedure: Cystoscopy, left retrograde pyelogram, ureteroscopy, laser lithotripsy, stone basket extraction, ureteral stent placement  Surgeon: Mena GoesEskridge  Anesthesia: Gen.  Indication for procedure: 25 year old with recurrent left lower pole stones and subsequent ureteral stones with history of complex left lower pole anatomy and narrowing of the left ureter at L4. A 6 mm stone appeared to be held up at L4 with a 4 mm stone proximal to that. There were 3 lower pole stones. Patient continue to be symptomatic left flank pain and was brought for ureteroscopy. He did not complete 24 hour urine nor get postoperative renal ultrasound after last ureteroscopy and we discussed importance of follow-up.  Findings: Cystourethroscopy was unremarkable.  Left retrograde pyelogram-the ureter appeared normal with obstruction in the proximal ureter at L4 where the stone was noted on CT and scout imaging. No contrast passed proximally. After obtaining proximal access repeat retrograde revealed a dilated collecting system.  Left ureteroscopy revealed a narrowing at L4 but there was no difficulty passing the scope or removing any of the fragments. 2 stones were noted in the renal pelvis and 2 stones in the lower pole all of these placed in the upper pole, fragmented and removed. I was not able to locate what I thought was the fifth stone in a more posterior lower pole calyx.  Description of procedure: After consent was obtained patient brought to the operating room. After adequate anesthesia he is placed in lithotomy position prepped and draped in the usual sterile fashion. A timeout was performed to confirm the patient and procedure. The cystoscope was passed per urethra and the left ureter cannulated with a 6 [place] open-ended catheter. The mid  and distal ureter filled normally but most of the contrast washed into the bladder. Also tried retrograde with a cone-tipped catheter but no contrast progress proximally. Therefore a sensor wire was advanced up into the collecting system in the 6 [place] open-ended catheter advanced into the proximal ureter where the wire was removed. There was good hydronephrotic drip. Contrast was injected which confirmed placement of the wire in the collecting system. I felt the distal and mid ureter were clear and therefore a ureteral access sheath was used to place a second wire without difficulty. I went up beside a glide Glidewire with the access sheath. The ureteroscope was then advanced where 2 stones had been pushed into the renal pelvis is were collected with the Nitinol basket and dropped in the upper pole. The lower pole was cleared of 2 large stones which were dropped in the upper pole and I could find no other stones. The upper pole was then accessed again and with a tuna microns laser fiber therefore stones were fragmented to manageable pieces at 0.6 and 6. The pieces were sequentially removed with a 0 tip basket as well as any engage basket. The collecting system was carefully inspected and noted no other stones. The ureter proximal ureter was inspected and noted to be without injury or fragment, the access sheath and ureteroscope were backed out together and noted to be normal. The wire was backloaded on the cystoscope and a 6 x 26 and a meter ureteral stent was advanced. The wire was removed with a good coil seen in the upper pole calyx and a good coil in the bladder. Lidocaine jelly was instilled per urethra. I left a string on the stent. The patient  was awakened and taken to recovery room in stable condition.  Complications: None  Blood loss: Minimal  Specimens: Stone fragments to office lab  Disposition: Patient stable to PACU

## 2016-07-30 NOTE — Anesthesia Postprocedure Evaluation (Signed)
Anesthesia Post Note  Patient: Thomas MendsRussell K Gato  Procedure(s) Performed: Procedure(s) (LRB): CYSTOSCOPY/URETEROSCOPY/HOLMIUM LASER/STENT PLACEMENT (Left)  Patient location during evaluation: PACU Anesthesia Type: General Level of consciousness: awake and alert Pain management: pain level controlled Vital Signs Assessment: post-procedure vital signs reviewed and stable Respiratory status: spontaneous breathing, nonlabored ventilation, respiratory function stable and patient connected to nasal cannula oxygen Cardiovascular status: blood pressure returned to baseline and stable Postop Assessment: no signs of nausea or vomiting Anesthetic complications: no    Last Vitals:  Vitals:   07/30/16 0915 07/30/16 0930  BP: 120/76 114/74  Pulse:  66  Resp: 12 10  Temp:      Last Pain:  Vitals:   07/30/16 0945  TempSrc:   PainSc: 5                  Verdia Bolt J

## 2016-07-30 NOTE — Discharge Instructions (Signed)
Post Anesthesia Home Care Instructions  Activity: Get plenty of rest for the remainder of the day. A responsible adult should stay with you for 24 hours following the procedure.  For the next 24 hours, DO NOT: -Drive a car -Advertising copywriterperate machinery -Drink alcoholic beverages -Take any medication unless instructed by your physician -Make any legal decisions or sign important papers.  Meals: Start with liquid foods such as gelatin or soup. Progress to regular foods as tolerated. Avoid greasy, spicy, heavy foods. If nausea and/or vomiting occur, drink only clear liquids until the nausea and/or vomiting subsides. Call your physician if vomiting continues.  Special Instructions/Symptoms: Your throat may feel dry or sore from the anesthesia or the breathing tube placed in your throat during surgery. If this causes discomfort, gargle with warm salt water. The discomfort should disappear within 24 hours.  If you had a scopolamine patch placed behind your ear for the management of post- operative nausea and/or vomiting:  1. The medication in the patch is effective for 72 hours, after which it should be removed.  Wrap patch in a tissue and discard in the trash. Wash hands thoroughly with soap and water. 2. You may remove the patch earlier than 72 hours if you experience unpleasant side effects which may include dry mouth, dizziness or visual disturbances. 3. Avoid touching the patch. Wash your hands with soap and water after contact with the patch.   Ureteral Stent Implantation, Care After Refer to this sheet in the next few weeks. These instructions provide you with information on caring for yourself after your procedure. Your health care provider may also give you more specific instructions. Your treatment has been planned according to current medical practices, but problems sometimes occur. Call your health care provider if you have any problems or questions after your procedure.  REMOVAL OF THE STENT:  remove the stent Tuesday morning, August 03, 2016 by pulling the string.   WHAT TO EXPECT AFTER THE PROCEDURE You should be back to normal activity within 48 hours after the procedure. Nausea and vomiting may occur and are commonly the result of anesthesia. It is common to experience sharp pain in the back or lower abdomen and penis with voiding. This is caused by movement of the ends of the stent with the act of urinating.It usually goes away within minutes after you have stopped urinating.  HOME CARE INSTRUCTIONS Make sure to drink plenty of fluids. You may have small amounts of bleeding, causing your urine to be red. This is normal. Certain movements may trigger pain or a feeling that you need to urinate. You may be given medicines to prevent infection or bladder spasms. Be sure to take all medicines as directed. Only take over-the-counter or prescription medicines for pain, discomfort, or fever as directed by your health care provider. Do not take aspirin, as this can make bleeding worse. Be sure to keep all follow-up appointments so your health care provider can check that you are healing properly.  SEEK MEDICAL CARE IF:  You experience increasing pain.  Your pain medicine is not working. SEEK IMMEDIATE MEDICAL CARE IF:  Your urine is dark red or has blood clots.  You are leaking urine (incontinent).  You have a fever, chills, feeling sick to your stomach (nausea), or vomiting.  Your pain is not relieved by pain medicine.  The end of the stent comes out of the urethra.  You are unable to urinate.   This information is not intended to replace advice given  to you by your health care provider. Make sure you discuss any questions you have with your health care provider.   Document Released: 08/08/2013 Document Revised: 12/11/2013 Document Reviewed: 06/20/2015 Elsevier Interactive Patient Education [employer]

## 2016-08-02 ENCOUNTER — Encounter (HOSPITAL_BASED_OUTPATIENT_CLINIC_OR_DEPARTMENT_OTHER): Payer: Self-pay | Admitting: Urology

## 2016-08-25 DIAGNOSIS — N200 Calculus of kidney: Secondary | ICD-10-CM | POA: Diagnosis not present

## 2016-09-24 DIAGNOSIS — N200 Calculus of kidney: Secondary | ICD-10-CM | POA: Diagnosis not present

## 2016-10-05 ENCOUNTER — Other Ambulatory Visit: Payer: Self-pay | Admitting: Neurology

## 2016-10-06 DIAGNOSIS — N201 Calculus of ureter: Secondary | ICD-10-CM | POA: Diagnosis not present

## 2016-10-06 DIAGNOSIS — E669 Obesity, unspecified: Secondary | ICD-10-CM | POA: Diagnosis not present

## 2016-10-06 DIAGNOSIS — N200 Calculus of kidney: Secondary | ICD-10-CM | POA: Diagnosis not present

## 2016-10-18 ENCOUNTER — Encounter: Payer: Self-pay | Admitting: Neurology

## 2016-10-18 ENCOUNTER — Ambulatory Visit (INDEPENDENT_AMBULATORY_CARE_PROVIDER_SITE_OTHER): Payer: BLUE CROSS/BLUE SHIELD | Admitting: Neurology

## 2016-10-18 VITALS — BP 114/70 | HR 68 | Resp 14 | Ht 68.0 in | Wt 211.5 lb

## 2016-10-18 DIAGNOSIS — M542 Cervicalgia: Secondary | ICD-10-CM | POA: Diagnosis not present

## 2016-10-18 DIAGNOSIS — G43009 Migraine without aura, not intractable, without status migrainosus: Secondary | ICD-10-CM | POA: Diagnosis not present

## 2016-10-18 MED ORDER — RIZATRIPTAN BENZOATE 10 MG PO TBDP: 10.0000 mg | ORAL_TABLET | ORAL | 6 refills | Status: DC | PRN

## 2016-10-18 MED ORDER — LAMOTRIGINE 100 MG PO TABS: 100.0000 mg | ORAL_TABLET | Freq: Every day | ORAL | 11 refills | Status: DC

## 2016-10-18 MED ORDER — LEVETIRACETAM 750 MG PO TABS: ORAL_TABLET | ORAL | 6 refills | Status: DC

## 2016-10-18 NOTE — Progress Notes (Signed)
[facility]  PATIENT: Thomas Howell DOB: September 24, 1991  REFERRING DOCTOR OR PCP:  Ardyth GalWilliam Luking SOURCE: patient, records in EMR  _________________________________   HISTORICAL  CHIEF COMPLAINT:  Chief Complaint  Patient presents with  . Migraines    Sts. h/a's initially improved after increasing Keppra to 750mg  qam and 1500mg  qhs.  Sts. since August, h/a's have been more frequent (about twice a week), same severity.  He is now having to take 2 Maxalt  to break h/a/fim    HISTORY OF PRESENT ILLNESS:  Thomas Howell  is a 25 yo man who has had frequent migraine headaches since age 25.     At last visit, he was experiencing about 12 headaches a month lasting > 4 hours each day and Keppra ws increased.  Initially, headaches and very well but over the last 2 months they are occurring about 8 times a month.      A typical headache starts in the occiput and then radiates to the eye with a pounding quality.  Pain can build up as the day goes on.    He has no preceding aura.   He gets nausea and vomiting.   Moving increases the headache and laying in bed in a dark quiet room helps.    He has associated phonophobia and photophobia. Most headaches are random but some are triggered by strong smells such as perfume.  Maxalt helps most headaches.  Often he has to take 2 over the day to get rid of the headache .   He uses about 10/month often in combination with Aleve.   Phenergan helps nausea    If he falls asleep, pain is better upon awakening.     HA treatment History:  On topiramate, the frequency dropped to 1/week but it caused him to have many more kidney stones. Amitriptyline caused grogginess the next day.   Nortriptyline had not helped.    He is tolerating Keppra well and 750 mg po qAM and 1500 mg qHS helps.  In between headaches, he has right >> left neck/occipital pain most days.   Aleve and other NSAID's by themself have not helped.  He sleeps well most  nights.    REVIEW OF SYSTEMS: Constitutional: No fevers, chills, sweats, or change in appetite Eyes: No visual changes, double vision, eye pain Ear, nose and throat: No hearing loss, ear pain, nasal congestion, sore throat Cardiovascular: No chest pain, palpitations Respiratory: No shortness of breath at rest or with exertion.   No wheezes GastrointestinaI: No nausea, vomiting, diarrhea, abdominal pain, fecal incontinence Genitourinary: No dysuria, urinary retention or frequency.  He has frequent kidney stones (sees Dr. Mena [facility]Eskridge at [facility]) Musculoskeletal: No neck pain, back pain Integumentary: No rash, pruritus, skin lesions Neurological: as above Psychiatric: No depression at this time.  No anxiety Endocrine: No palpitations, diaphoresis, change in appetite, change in weigh or increased thirst Hematologic/Lymphatic: No anemia, purpura, petechiae. Allergic/Immunologic: No itchy/runny eyes, nasal congestion, recent allergic reactions, rashes  ALLERGIES: Allergies  Allergen Reactions  . Codeine Swelling    Lips swell//  This includes cough syrup w/ codeine  . Adhesive [Tape] Rash    HOME MEDICATIONS:  Current Outpatient Prescriptions:  .  levETIRAcetam (KEPPRA) 750 MG tablet, Take one po qAM and 2 po q HS, Disp: 90 tablet, Rfl: 6 .  ondansetron (ZOFRAN) 8 MG tablet, Take 1 tablet (8 mg total) by mouth every 8 (eight) hours as needed for nausea or vomiting., Disp: 20 tablet, Rfl:  0 .  Potassium Citrate 15 MEQ (1620 MG) TBCR, Take 2 tablets by mouth 2 (two) times daily., Disp: , Rfl: 6 .  rizatriptan (MAXALT-MLT) 10 MG disintegrating tablet, Take 1 tablet (10 mg total) by mouth as needed for migraine. May repeat in 2 hours if needed; max 2 per 24 hrs, Disp: 10 tablet, Rfl: 6 .  lamoTRIgine (LAMICTAL) 100 MG tablet, Take 1 tablet (100 mg total) by mouth daily. Take 1/2 po qd x 5 days, then 1/2 po bid x 5 d, then 1/2 po tid x 5d then 1 po bid, Disp: 60 tablet, Rfl:  11  PAST MEDICAL HISTORY: Past Medical History:  Diagnosis Date  . Complication of anesthesia   . History of kidney stones   . Left ureteral stone   . Migraine   . PONV (postoperative nausea and vomiting)   . Renal calculi    bilateral per ct 07-26-2016  . Scoliosis of lumbar spine 1994   treated at [facility] until age 25  . Wears glasses     PAST SURGICAL HISTORY: Past Surgical History:  Procedure Laterality Date  . CYSTOSCOPY W/ RETROGRADES Right 07/06/2015   Procedure: CYSTOSCOPY WITH RETROGRADE PYELOGRAM;  Surgeon: Jerilee FieldMatthew Eskridge, MD;  Location: [facility];  Service: Urology;  Laterality: Right;  . CYSTOSCOPY W/ URETERAL STENT PLACEMENT Left 08/08/2015   Procedure: CYSTOSCOPY WITH STENT REPLACEMENT;  Surgeon: Jerilee FieldMatthew Eskridge, MD;  [facility] [place];  Service: Urology;  Laterality: Left;  . CYSTOSCOPY WITH RETROGRADE PYELOGRAM, URETEROSCOPY AND STENT PLACEMENT Left 06/24/2014   Procedure: CYSTOSCOPY WITH RETROGRADE PYELOGRAM,  AND STENT PLACEMENT;  Surgeon: Magdalene Mollyaniel Y Woodruff, MD;  Location: [facility];  Service: Urology;  Laterality: Left;  . CYSTOSCOPY WITH RETROGRADE PYELOGRAM, URETEROSCOPY AND STENT PLACEMENT Left 07/05/2014   Procedure: CYSTO/LEFT URETEROSCOPY/LEFT RETROGRADE PYELOGRAM/LEFT STENT PLACEMENT;  Surgeon: Jerilee FieldMatthew Eskridge, MD;  [facility] [place];  Service: Urology;  Laterality: Left;  . CYSTOSCOPY WITH RETROGRADE PYELOGRAM, URETEROSCOPY AND STENT PLACEMENT Left 07/06/2015   Procedure: CYSTOSCOPY WITH RETROGRADE PYELOGRAM, URETEROSCOPY , LASER, STENT PLACEMENT and BASKET EXTRACTION;  Surgeon: Jerilee FieldMatthew Eskridge, MD;  Location: [facility];  Service: Urology;  Laterality: Left;  . CYSTOSCOPY WITH RETROGRADE PYELOGRAM, URETEROSCOPY AND STENT PLACEMENT Left 08/19/2015   Procedure: CYSTOSCOPY WITH RETROGRADE PYELOGRAM, URETEROSCOPY AND STENT PLACEMENT;  Surgeon: Jerilee FieldMatthew Eskridge, MD;  Location: [facility];  Service: Urology;  Laterality: Left;  . CYSTOSCOPY  WITH URETEROSCOPY AND STENT PLACEMENT Left 01/16/2016   Procedure: CYSTOSCOPY WITH LEFT RETROGRADE PYELOGRAM  LEFT DIGITAL URETEROSCOPY AND PLACEMENT LEFT URETERAL STENT;  Surgeon: Jerilee FieldMatthew Eskridge, MD;  Location: [facility];  Service: Urology;  Laterality: Left;  . CYSTOSCOPY WITH URETEROSCOPY, STONE BASKETRY AND STENT PLACEMENT Left 02/13/2016   Procedure: CYSTOSCOPY WITH LEFT URETEROSCOPY, HOLMIUM LASER AND STENT PLACEMENT;  Surgeon: Jerilee FieldMatthew Eskridge, MD;  Location: [facility];  Service: Urology;  Laterality: Left;  . CYSTOSCOPY/RETROGRADE/URETEROSCOPY/STONE EXTRACTION WITH BASKET Left 08/08/2015   Procedure: CYSTOSCOPY/RETROGRADE/URETEROSCOPY/STONE EXTRACTION WITH BASKET;  Surgeon: Jerilee FieldMatthew Eskridge, MD;  [facility] [place];  Service: Urology;  Laterality: Left;  . CYSTOSCOPY/URETEROSCOPY/HOLMIUM LASER/STENT PLACEMENT Left 07/30/2016   Procedure: CYSTOSCOPY/URETEROSCOPY/HOLMIUM LASER/STENT PLACEMENT;  Surgeon: Jerilee FieldMatthew Eskridge, MD;  [facility] [place];  Service: Urology;  Laterality: Left;  . EXTRACORPOREAL SHOCK WAVE LITHOTRIPSY Left 07-07-2015  &  12-26-2014  . 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 HOLMIUM LASER APPLICATION Left 07/05/2014   Procedure: LASER LITHO;  Surgeon: Jerilee FieldMatthew Eskridge,  MD;  Location: [facility];  Service: Urology;  Laterality: Left;  . HOLMIUM LASER APPLICATION Left 08/08/2015   Procedure: HOLMIUM LASER  WITH LITHOTRIPSY ;  Surgeon: Jerilee Field, MD;  Location[facility];  Service: Urology;  Laterality: Left;  . LYMPH GLAND EXCISION  2003   neck--  benign    FAMILY HISTORY: Family History  Problem Relation Age of Onset  . Cancer Father   . Kidney Stones Mother   . Cancer Other   . Hypertension Other   . Hyperlipidemia Other   . Stroke Other   . Kidney Stones Brother     SOCIAL HISTORY:  Social History   Social History  .  Marital status: Single    Spouse name: N/A  . Number of children: N/A  . Years of education: N/A   Occupational History  . Not on file.   Social History Main Topics  . Smoking status: Former Smoker    Years: 2.00    Quit date: 07/29/2014  . Smokeless tobacco: Never Used     Comment: social smoker  . Alcohol use 0.0 oz/week     Comment: occ  . Drug use: No  . Sexual activity: No   Other Topics Concern  . Not on file   Social History Narrative  . No narrative on file     PHYSICAL EXAM  Vitals:   10/18/16 0833  BP: 114/70  Pulse: 68  Resp: 14  Weight: 211 lb 8 oz (95.9 kg)  Height: 5\' 8"  (1.727 m)    Body mass index is 32.16 kg/m.   General: The patient is well-developed and well-nourished and in no acute distress  Neck: The neck is supple, no carotid bruits are noted.  The neck is tender over the left splenius capitis muscle/occipital nerve.  Neurologic Exam  Mental status: The patient is alert and oriented x 3 at the time of the examination. The patient has apparent normal recent and remote memory, with an apparently normal attention span and concentration ability.   Speech is normal.  Cranial nerves: Extraocular movements are full.   Facial symmetry is present. Facial strength is normal.  Trapezius and sternocleidomastoid strength is normal. No dysarthria is noted.  The tongue is midline, and the patient has symmetric elevation of the soft palate. No obvious hearing deficits are noted.  Motor:  Muscle bulk is normal.   Tone is normal. Strength is  5 / 5 in all 4 extremities.   Coordination: Cerebellar testing reveals good finger-nose-finger ilaterally.  Gait and station: Station is normal.   Gait is normal. Tandem gait is normal.       DIAGNOSTIC DATA (LABS, IMAGING, TESTING) - I reviewed patient records, labs, notes, testing and imaging myself where available.  Lab Results  Component Value Date   WBC 6.5 07/26/2016   HGB 17.2 07/26/2016   HCT 49.6  07/26/2016   MCV 87.4 07/26/2016   PLT 179 07/26/2016      Component Value Date/Time   NA 137 07/26/2016 1614   K 3.7 07/26/2016 1614   CL 103 07/26/2016 1614   CO2 22 07/26/2016 1614   GLUCOSE 104 (H) 07/26/2016 1614   BUN 17 07/26/2016 1614   CREATININE 0.98 07/26/2016 1614   CALCIUM 9.8 07/26/2016 1614   PROT 7.9 07/14/2015 2215   ALBUMIN 4.5 07/14/2015 2215   AST 18 07/14/2015 2215   ALT 12 (L) 07/14/2015 2215   ALKPHOS 53 07/14/2015 2215   BILITOT  0.4 07/14/2015 2215   GFRNONAA >60 07/26/2016 1614   GFRAA >60 07/26/2016 1614       ASSESSMENT AND PLAN  Neck pain  Migraine without aura and without status migrainosus, not intractable    1.  Continue Keppra to 750 mg in a.m. and 1500 mg at night..    2.   Add lamotrigine and titrate to 100 mg by mouth twice a day.    If this does not help, consider increasing the Keppra further changing lamotrigine to Trileptal, or changing lamotrigine to Depakote. 3.   Continue prn Maxalt. 4.   Return in 6 mos or sooner if there are significant changes in the headache frequency or other new neurologic symptoms.    Richard A. Epimenio Foot, MD, PhD 10/18/2016, 9:04 AM Certified in Neurology, Clinical Neurophysiology, Sleep Medicine, Pain Medicine and Neuroimaging  [facility] [address], [address] [place], [place] [address] [phone]

## 2016-10-18 NOTE — Patient Instructions (Signed)
The pharmacy has the prescription for lamotrigine 100 mg tablets. For 5 days, just take one half pill a day. For the next 5 days, take one half pill twice a day. For the next 5 days, take one half pill 3 times a day Then start taking one pill twice a day from this point on.    In the future, we may increase the dose further.  If you get a rash, need to stop the medication and not take it again. 

## 2016-10-22 DIAGNOSIS — J069 Acute upper respiratory infection, unspecified: Secondary | ICD-10-CM | POA: Diagnosis not present

## 2016-10-22 DIAGNOSIS — J029 Acute pharyngitis, unspecified: Secondary | ICD-10-CM | POA: Diagnosis not present

## 2016-11-15 ENCOUNTER — Encounter (HOSPITAL_COMMUNITY): Payer: Self-pay | Admitting: *Deleted

## 2016-11-15 ENCOUNTER — Emergency Department (HOSPITAL_COMMUNITY)
Admission: EM | Admit: 2016-11-15 | Discharge: 2016-11-15 | Disposition: A | Discharge status: Home / Self Care | Payer: BLUE CROSS/BLUE SHIELD | Attending: Emergency Medicine | Admitting: Emergency Medicine

## 2016-11-15 DIAGNOSIS — Z87891 Personal history of nicotine dependence: Secondary | ICD-10-CM | POA: Insufficient documentation

## 2016-11-15 DIAGNOSIS — G43909 Migraine, unspecified, not intractable, without status migrainosus: Secondary | ICD-10-CM | POA: Diagnosis not present

## 2016-11-15 DIAGNOSIS — Z79899 Other long term (current) drug therapy: Secondary | ICD-10-CM | POA: Insufficient documentation

## 2016-11-15 MED ORDER — METOCLOPRAMIDE HCL 5 MG/ML IJ SOLN
10.0000 mg | Freq: Once | INTRAMUSCULAR | Status: AC
  Administered 2016-11-15: 10 mg via INTRAVENOUS
  Filled 2016-11-15: qty 2

## 2016-11-15 MED ORDER — KETOROLAC TROMETHAMINE 30 MG/ML IJ SOLN
15.0000 mg | Freq: Once | INTRAMUSCULAR | Status: AC
  Administered 2016-11-15: 15 mg via INTRAVENOUS
  Filled 2016-11-15: qty 1

## 2016-11-15 MED ORDER — DIPHENHYDRAMINE HCL 50 MG/ML IJ SOLN
25.0000 mg | Freq: Once | INTRAMUSCULAR | Status: AC
  Administered 2016-11-15: 25 mg via INTRAVENOUS
  Filled 2016-11-15: qty 1

## 2016-11-15 NOTE — Discharge Instructions (Signed)

## 2016-11-15 NOTE — ED Notes (Signed)
Pt states pain and nausea have completely resided. Water given

## 2016-11-15 NOTE — ED Triage Notes (Signed)
Pt c/o migraine headache that started today with n/v, pt reports that he has not been able to keep any medications down today,

## 2016-11-15 NOTE — ED Provider Notes (Signed)
[facility] Provider Note   CSN: [id number] Arrival date & time: 11/15/16  0115     History   Chief Complaint Chief Complaint  Patient presents with  . Migraine    HPI Thomas Howell is a 25 y.o. male.  The history is provided by the patient.  Migraine  This is a recurrent problem. The current episode started 6 to 12 hours ago. The problem occurs constantly. The problem has been gradually worsening. Associated symptoms include headaches. Pertinent negatives include no chest pain and no shortness of breath. Nothing aggravates the symptoms. Nothing relieves the symptoms.   Pt reports gradual onset of HA/migraine during the day while at work Soon after it started he began having nausea/vomiting.   No fever No visual changes No focal weakness No head trauma This is similar to prior episodes of migraines  Past Medical History:  Diagnosis Date  . Complication of anesthesia   . History of kidney stones   . Left ureteral stone   . Migraine   . PONV (postoperative nausea and vomiting)   . Renal calculi    bilateral per ct 07-26-2016  . Scoliosis of lumbar spine 1994   treated at [facility] until age [age]  . Wears glasses     Patient Active Problem List   Diagnosis Date Noted  . Neck pain 02/04/2016  . Hydronephrosis, left 08/19/2015  . Hydronephrosis determined by ultrasound   . Kidney stone on left side 08/18/2015  . Nephrolithiasis 08/18/2015  . Kidney stone 07/06/2015  . Left ureteral stone 07/06/2015  . Analgesic rebound headache 06/05/2014  . Migraine headache without aura 05/30/2014    Past Surgical History:  Procedure Laterality Date  . CYSTOSCOPY W/ RETROGRADES Right 07/06/2015   Procedure: CYSTOSCOPY WITH RETROGRADE PYELOGRAM;  Surgeon: Jerilee FieldMatthew Eskridge, MD;  Location: [facility];  Service: Urology;  Laterality: Right;  . CYSTOSCOPY W/ URETERAL STENT PLACEMENT Left 08/08/2015   Procedure: CYSTOSCOPY WITH STENT REPLACEMENT;  Surgeon: Jerilee FieldMatthew Eskridge, MD;   [facility] [place];  Service: Urology;  Laterality: Left;  . CYSTOSCOPY WITH RETROGRADE PYELOGRAM, URETEROSCOPY AND STENT PLACEMENT Left 06/24/2014   Procedure: CYSTOSCOPY WITH RETROGRADE PYELOGRAM,  AND STENT PLACEMENT;  Surgeon: Magdalene Mollyaniel Y Woodruff, MD;  Location: [facility];  Service: Urology;  Laterality: Left;  . CYSTOSCOPY WITH RETROGRADE PYELOGRAM, URETEROSCOPY AND STENT PLACEMENT Left 07/05/2014   Procedure: CYSTO/LEFT URETEROSCOPY/LEFT RETROGRADE PYELOGRAM/LEFT STENT PLACEMENT;  Surgeon: Jerilee FieldMatthew Eskridge, MD;  [facility] [place];  Service: Urology;  Laterality: Left;  . CYSTOSCOPY WITH RETROGRADE PYELOGRAM, URETEROSCOPY AND STENT PLACEMENT Left 07/06/2015   Procedure: CYSTOSCOPY WITH RETROGRADE PYELOGRAM, URETEROSCOPY , LASER, STENT PLACEMENT and BASKET EXTRACTION;  Surgeon: Jerilee FieldMatthew Eskridge, MD;  [facility];  Service: Urology;  Laterality: Left;  . CYSTOSCOPY WITH RETROGRADE PYELOGRAM, URETEROSCOPY AND STENT PLACEMENT Left 08/19/2015   Procedure: CYSTOSCOPY WITH RETROGRADE PYELOGRAM, URETEROSCOPY AND STENT PLACEMENT;  Surgeon: Jerilee FieldMatthew Eskridge, MD;  [facility];  Service: Urology;  Laterality: Left;  . CYSTOSCOPY WITH URETEROSCOPY AND STENT PLACEMENT Left 01/16/2016   Procedure: CYSTOSCOPY WITH LEFT RETROGRADE PYELOGRAM  LEFT DIGITAL URETEROSCOPY AND PLACEMENT LEFT URETERAL STENT;  Surgeon: Jerilee FieldMatthew Eskridge, MD;  Location[facility];  Service: Urology;  Laterality: Left;  . CYSTOSCOPY WITH URETEROSCOPY, STONE BASKETRY AND STENT PLACEMENT Left 02/13/2016   Procedure: CYSTOSCOPY WITH LEFT URETEROSCOPY, HOLMIUM LASER AND STENT PLACEMENT;  Surgeon: Jerilee FieldMatthew Eskridge, MD;  [facility];  Service: Urology;  Laterality: Left;  . CYSTOSCOPY/RETROGRADE/URETEROSCOPY/STONE EXTRACTION WITH BASKET Left 08/08/2015  Procedure: CYSTOSCOPY/RETROGRADE/URETEROSCOPY/STONE EXTRACTION WITH BASKET;  Surgeon: Jerilee Field, MD;  Location: [facility];  Service:  Urology;  Laterality: Left;  . CYSTOSCOPY/URETEROSCOPY/HOLMIUM LASER/STENT PLACEMENT Left 07/30/2016   Procedure: CYSTOSCOPY/URETEROSCOPY/HOLMIUM LASER/STENT PLACEMENT;  Surgeon: Jerilee Field, MD;  Location: [facility];  Service: Urology;  Laterality: Left;  . EXTRACORPOREAL SHOCK WAVE LITHOTRIPSY Left 07-07-2015  &  12-26-2014  . 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 HOLMIUM LASER APPLICATION Left 07/05/2014   Procedure: LASER LITHO;  Surgeon: Jerilee Field, MD;  Location: [facility];  Service: Urology;  Laterality: Left;  . HOLMIUM LASER APPLICATION Left 08/08/2015   Procedure: HOLMIUM LASER  WITH LITHOTRIPSY ;  Surgeon: Jerilee Field, MD;  Location: [facility];  Service: Urology;  Laterality: Left;  . LYMPH GLAND EXCISION  2003   neck--  benign       Home Medications    Prior to Admission medications   Medication Sig Start Date End Date Taking? Authorizing Provider  lamoTRIgine (LAMICTAL) 100 MG tablet Take 1 tablet (100 mg total) by mouth daily. Take 1/2 po qd x 5 days, then 1/2 po bid x 5 d, then 1/2 po tid x 5d then 1 po bid 10/18/16   Asa Lente, MD  levETIRAcetam (KEPPRA) 750 MG tablet Take one po qAM and 2 po q HS 10/18/16   Asa Lente, MD  ondansetron (ZOFRAN) 8 MG tablet Take 1 tablet (8 mg total) by mouth every 8 (eight) hours as needed for nausea or vomiting. 07/26/16   Joni Reining, PA-C  Potassium Citrate 15 MEQ (1620 MG) TBCR Take 2 tablets by mouth 2 (two) times daily. 10/06/16   Historical Provider, MD  rizatriptan (MAXALT-MLT) 10 MG disintegrating tablet Take 1 tablet (10 mg total) by mouth as needed for migraine. May repeat in 2 hours if needed; max 2 per 24 hrs 10/18/16   Asa Lente, MD    Family History Family History  Problem Relation Age of Onset  . Cancer Father   . Kidney Stones Mother   . Cancer Other   . Hypertension  Other   . Hyperlipidemia Other   . Stroke Other   . Kidney Stones Brother     Social History Social History  Substance Use Topics  . Smoking status: Former Smoker    Years: 2.00    Quit date: 07/29/2014  . Smokeless tobacco: Never Used     Comment: social smoker  . Alcohol use 0.0 oz/week     Comment: occ     Allergies   Codeine and Adhesive [tape]   Review of Systems Review of Systems  Constitutional: Negative for fever.  Eyes: Negative for visual disturbance.  Respiratory: Negative for shortness of breath.   Cardiovascular: Negative for chest pain.  Neurological: Positive for headaches. Negative for weakness.  All other systems reviewed and are negative.    Physical Exam Updated Vital Signs BP 144/88   Pulse 74   Temp 97.6 F (36.4 C) (Oral)   Resp 18   Ht 5\' 8"  (1.727 m)   Wt 90.7 kg   SpO2 98%   BMI 30.41 kg/m   Physical Exam  CONSTITUTIONAL: Well developed/well nourished HEAD: Normocephalic/atraumatic EYES: EOMI/PERRL, no nystagmus, no ptosis, ENMT: Mucous membranes moist NECK: supple no meningeal signs, no bruits SPINE/BACK:entire spine nontender CV: S1/S2 noted, no murmurs/rubs/gallops noted LUNGS: Lungs are clear to auscultation bilaterally, no apparent  distress ABDOMEN: soft, nontender, no rebound or guarding GU:no cva tenderness NEURO:Awake/alert, face symmetric, no arm or leg drift is noted Equal 5/5 strength with shoulder abduction, elbow flex/extension, wrist flex/extension in upper extremities  Equal 5/5 strength with hip flexion,knee flex/extension, foot dorsi/plantar flexion Cranial nerves 3/4/5/6/06/27/09/11/12 tested and intact Gait normal without ataxia No past pointing Sensation to light touch intact in all extremities EXTREMITIES: pulses normal, full ROM SKIN: warm, color normal PSYCH: no abnormalities of mood noted, alert and oriented to situation   ED Treatments / Results  Labs (all labs ordered are listed, but only abnormal  results are displayed) Labs Reviewed - No data to display  EKG  EKG Interpretation None       Radiology No results found.  Procedures Procedures (including critical care time)  Medications Ordered in ED Medications  metoCLOPramide (REGLAN) injection 10 mg (10 mg Intravenous Given 11/15/16 0218)  diphenhydrAMINE (BENADRYL) injection 25 mg (25 mg Intravenous Given 11/15/16 0217)  ketorolac (TORADOL) 30 MG/ML injection 15 mg (15 mg Intravenous Given 11/15/16 0217)     Initial Impression / Assessment and Plan / ED Course  I have reviewed the triage vital signs and the nursing notes.    Clinical Course     Pt back to baseline HA has completely resolved He is requesting d/c home  Final Clinical Impressions(s) / ED Diagnoses   Final diagnoses:  Migraine without status migrainosus, not intractable, unspecified migraine type    New Prescriptions New Prescriptions   No medications on file     Zadie Rhineonald Kenita Bines, MD 11/15/16 [phone]

## 2016-11-29 ENCOUNTER — Ambulatory Visit (INDEPENDENT_AMBULATORY_CARE_PROVIDER_SITE_OTHER): Payer: BLUE CROSS/BLUE SHIELD | Admitting: Family Medicine

## 2016-11-29 ENCOUNTER — Encounter: Payer: Self-pay | Admitting: Family Medicine

## 2016-11-29 VITALS — Temp 98.2°F | Ht 68.0 in | Wt 217.8 lb

## 2016-11-29 DIAGNOSIS — R21 Rash and other nonspecific skin eruption: Secondary | ICD-10-CM

## 2016-11-29 MED ORDER — TRIAMCINOLONE ACETONIDE 0.1 % EX OINT: 1.0000 "application " | TOPICAL_OINTMENT | Freq: Two times a day (BID) | CUTANEOUS | 0 refills | Status: DC

## 2016-11-29 NOTE — Progress Notes (Signed)
   Subjective:    Patient ID: Thomas Howell, male    DOB: 01/12/1991, 25 y.o.   MRN: [id number]  HPI Patient arrives wit c/o rash on right thigh for 2 weeks. Started couple weeks ago after hiking. The rash started above the sock. Pruritic at times. Worse when sweating.   Review of Systems No headache, no major weight loss or weight gain, no chest pain no back pain abdominal pain no change in bowel habits complete ROS otherwise negative     Objective:   Physical Exam contaAlert vitals stable, NAD. Blood pressure good on repeat. HEENT normal. Lungs clear. Heart regular rate and rhythm.        Assessment & Plan:  Contact dernatitis, likely rhus dermatitis Plan triam cin oint bid afected are, local measures disc, fift min spent most in disc

## 2016-12-22 IMAGING — CT CT RENAL STONE PROTOCOL
2 of 4 series · 16 of 46 positions shown, 18 images · non-contrast
Comparison: 01/15/2016

CLINICAL DATA: Acute onset left flank pain this morning. nausea and
vomiting. Urolithiasis.

EXAM:
CT ABDOMEN AND PELVIS WITHOUT CONTRAST
TECHNIQUE: Multidetector CT imaging of the abdomen and pelvis was performed
following the standard protocol without IV contrast.

[Series 2: routine abd pel with · axial · 0.75mm/px · z∈[-467,-12]mm · 13 of 101 slices shown, 15 images]
[im 5/101  soft-tissue]
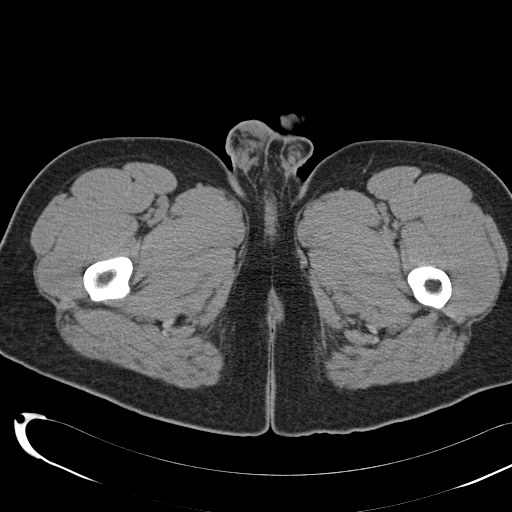
[im 5/101  bone]
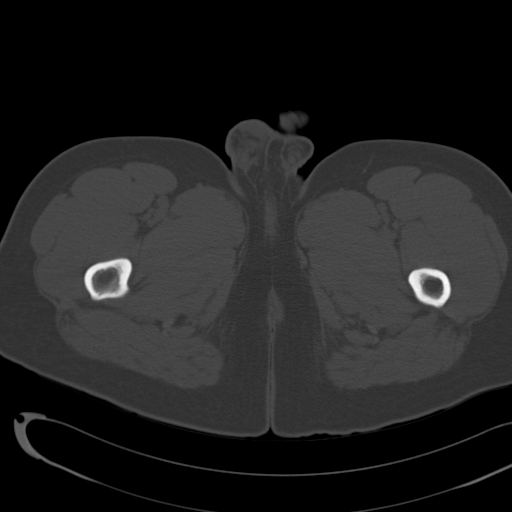
[im 14/101  soft-tissue]
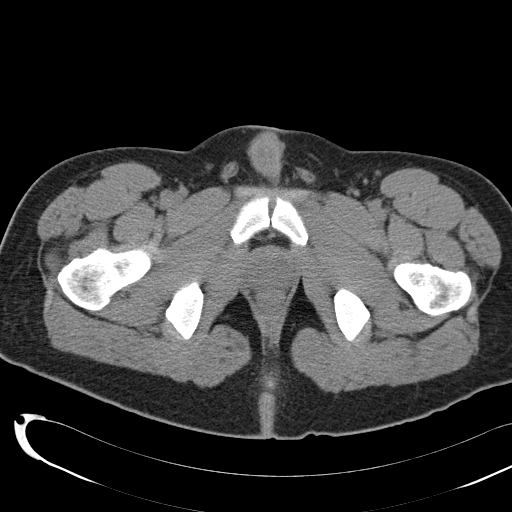
[im 22/101  soft-tissue]
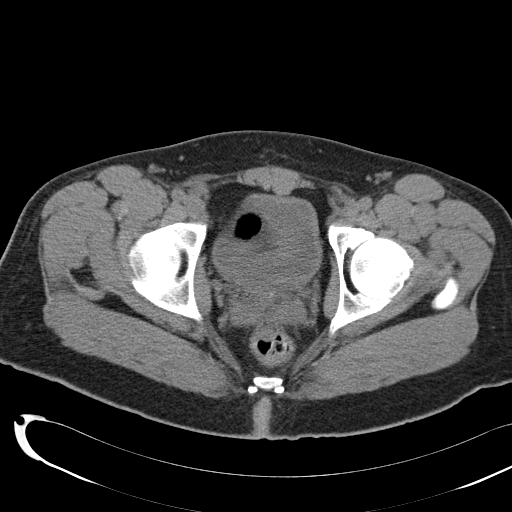
[im 27/101  soft-tissue]
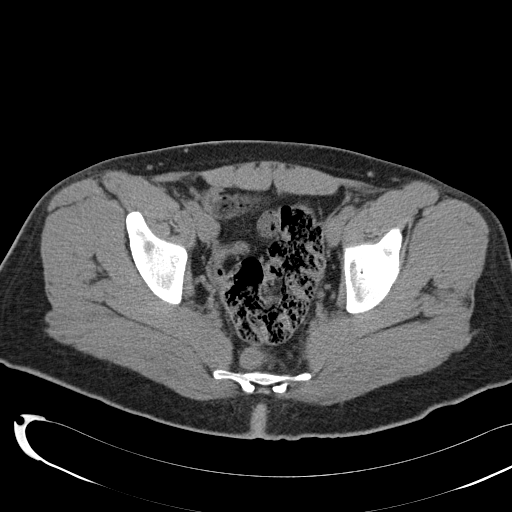
[im 35/101  soft-tissue]
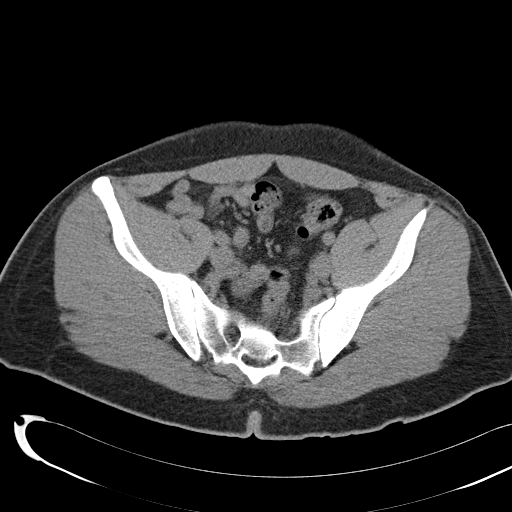
[im 44/101  soft-tissue]
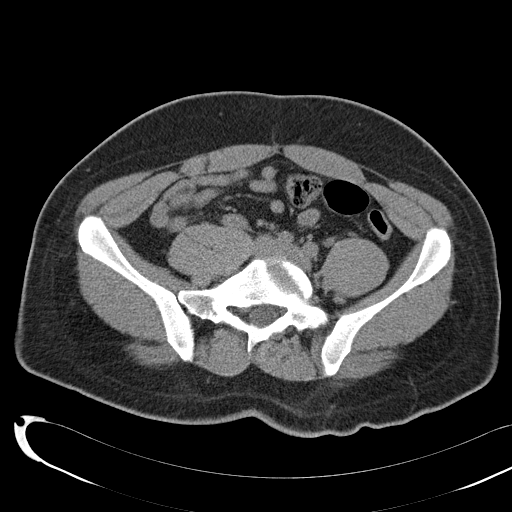
[im 53/101  soft-tissue]
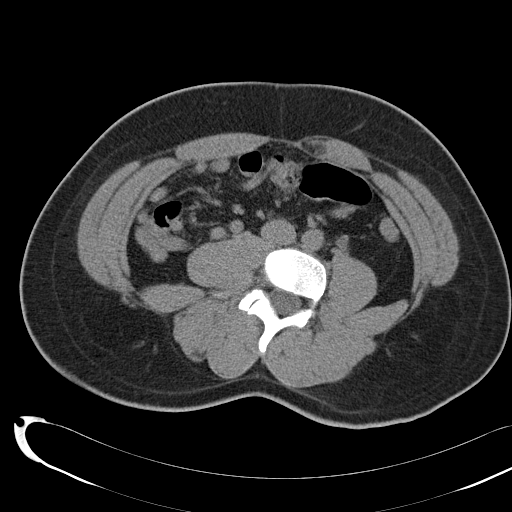
[im 57/101  soft-tissue]
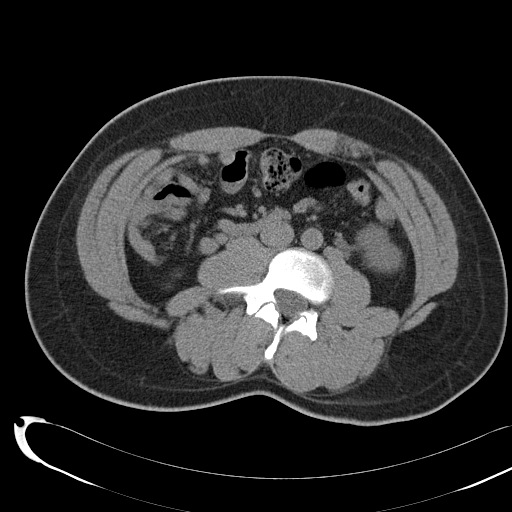
[im 66/101  soft-tissue]
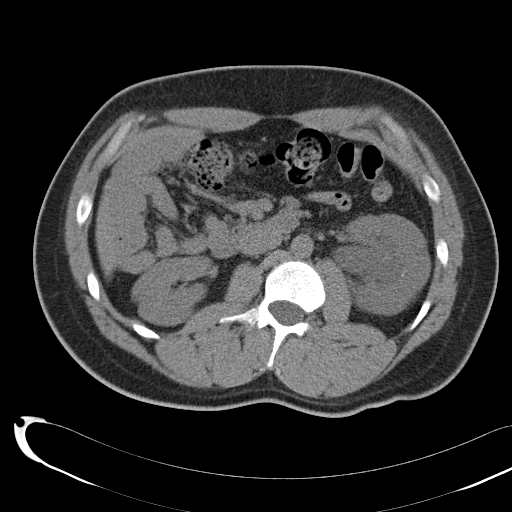
[im 66/101  bone]
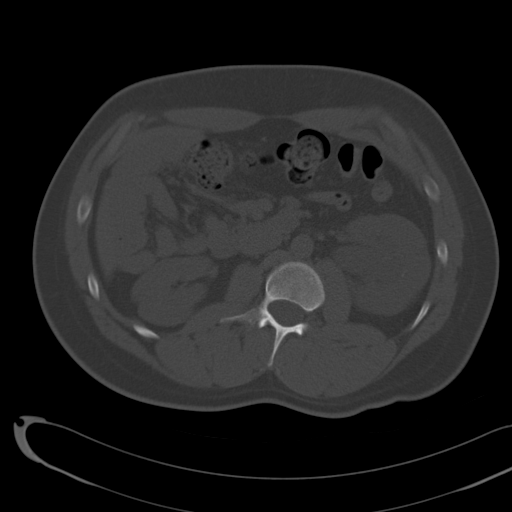
[im 74/101  soft-tissue]
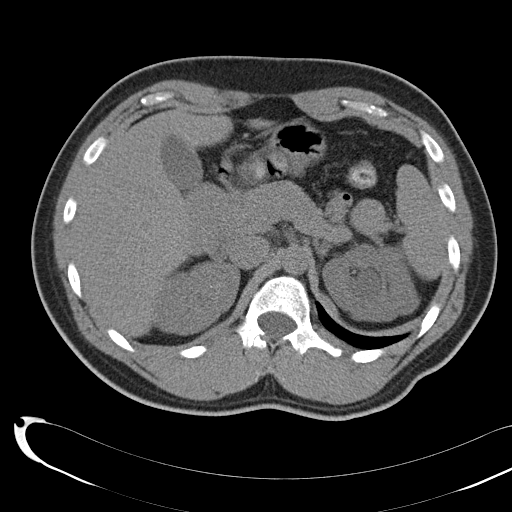
[im 79/101  soft-tissue]
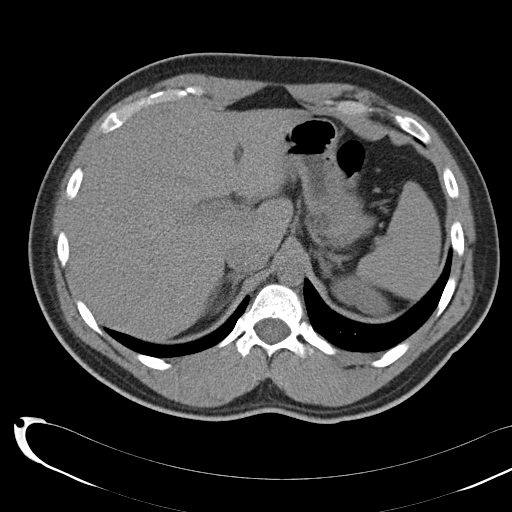
[im 87/101  soft-tissue]
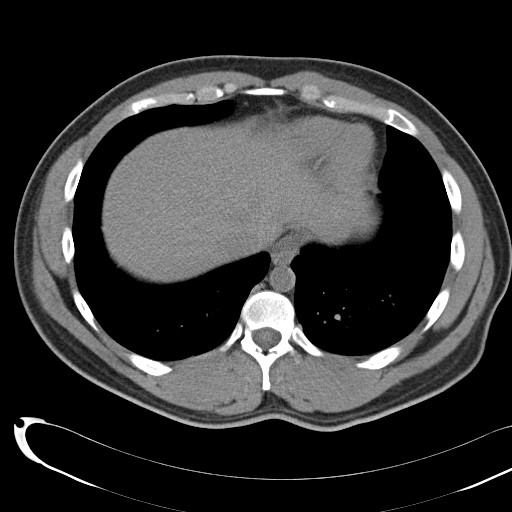
[im 96/101  soft-tissue]
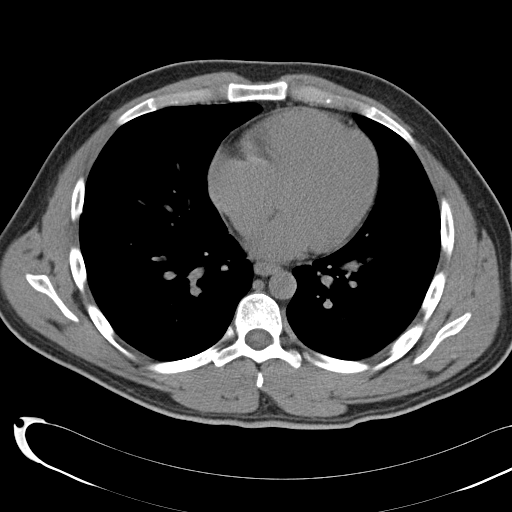

[Series 4: coronal · coronal · 0.73mm/px · 3 of 137 slices shown]
[im 46/137  soft-tissue]
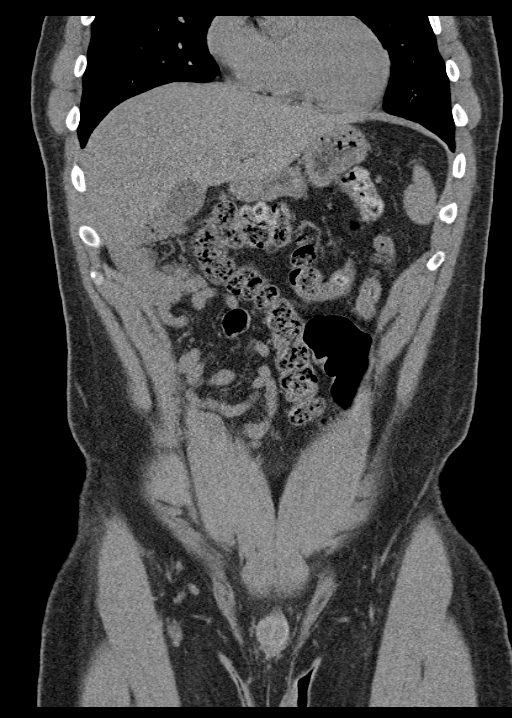
[im 61/137  soft-tissue]
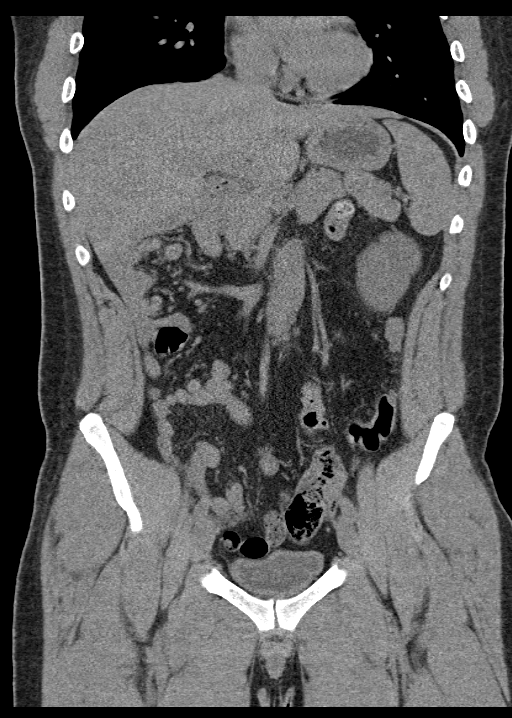
[im 76/137  soft-tissue]
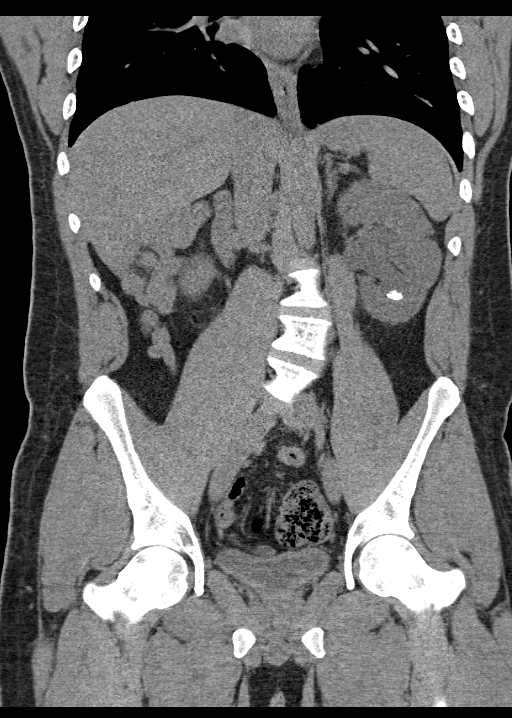

[16 of 46 positions shown; findings below may reference images not displayed]

FINDINGS: Lower chest:  No acute findings.

Hepatobiliary: No significant abnormality seen. Gallbladder is
unremarkable.

Pancreas: No abnormality identified.

Spleen: Normal size and appearance.

Adrenals/Urinary Tract: Normal adrenal glands. Bilateral intrarenal
calculi again demonstrated, largest in lower pole of left kidney
measuring 11 mm. Moderate left hydronephrosis and proximal
ureterectasis remains stable. He 3 mm calculus is again seen in the
left mid ureter at the level of the iliac crest on image 52 which is
unchanged in location since previous study. No other ureteral
calculi identified.

Stomach/Bowel: Congenital malrotation of bowel is noted withJejunum
located in the left upper quadrant and colon seen within the central
and left abdomen. This is unchanged in appearance. No evidence of
bowel obstruction, volvulus, inflammatory process, or abnormal fluid
collections.

Vascular/Lymphatic: No pathologically enlarged lymph nodes
identified. No evidence of abdominal aortic aneurysm.

Reproductive: Normal size prostate and seminal vesicles.

Other: None

Musculoskeletal: No suspicious bone lesions identified. Congenital
hemivertebra noted in the sacrum with associated lumbar
levoscoliosis.
IMPRESSION: Moderate left hydronephrosis due to 3 mm left mid ureteral calculus,
without significant change compared to prior study.

Bilateral nonobstructive nephrolithiasis.

Congenital bowel malrotation again noted. Congenital anomaly of bony
sacrum also noted with associated levoscoliosis.

## 2016-12-24 DIAGNOSIS — R05 Cough: Secondary | ICD-10-CM | POA: Diagnosis not present

## 2016-12-24 DIAGNOSIS — J22 Unspecified acute lower respiratory infection: Secondary | ICD-10-CM | POA: Diagnosis not present

## 2016-12-24 DIAGNOSIS — H66001 Acute suppurative otitis media without spontaneous rupture of ear drum, right ear: Secondary | ICD-10-CM | POA: Diagnosis not present

## 2016-12-26 ENCOUNTER — Encounter (HOSPITAL_COMMUNITY): Payer: Self-pay | Admitting: Emergency Medicine

## 2016-12-26 ENCOUNTER — Emergency Department (HOSPITAL_COMMUNITY)
Admission: EM | Admit: 2016-12-26 | Discharge: 2016-12-26 | Disposition: A | Discharge status: Home / Self Care | Payer: BLUE CROSS/BLUE SHIELD | Attending: Emergency Medicine | Admitting: Emergency Medicine

## 2016-12-26 ENCOUNTER — Emergency Department (HOSPITAL_COMMUNITY): Payer: BLUE CROSS/BLUE SHIELD

## 2016-12-26 DIAGNOSIS — Z79899 Other long term (current) drug therapy: Secondary | ICD-10-CM | POA: Insufficient documentation

## 2016-12-26 DIAGNOSIS — N201 Calculus of ureter: Secondary | ICD-10-CM | POA: Diagnosis not present

## 2016-12-26 DIAGNOSIS — N200 Calculus of kidney: Secondary | ICD-10-CM | POA: Diagnosis not present

## 2016-12-26 DIAGNOSIS — Z87891 Personal history of nicotine dependence: Secondary | ICD-10-CM | POA: Diagnosis not present

## 2016-12-26 DIAGNOSIS — I10 Essential (primary) hypertension: Secondary | ICD-10-CM | POA: Diagnosis not present

## 2016-12-26 LAB — CBC WITH DIFFERENTIAL/PLATELET
Basophils Absolute: 0 10*3/uL (ref 0.0–0.1)
Basophils Relative: 0 %
Eosinophils Absolute: 0 10*3/uL (ref 0.0–0.7)
Eosinophils Relative: 0 %
HCT: 46.1 % (ref 39.0–52.0)
Hemoglobin: 15.9 g/dL (ref 13.0–17.0)
Lymphocytes Relative: 11 %
Lymphs Abs: 0.8 10*3/uL (ref 0.7–4.0)
MCH: 30.9 pg (ref 26.0–34.0)
MCHC: 34.5 g/dL (ref 30.0–36.0)
MCV: 89.7 fL (ref 78.0–100.0)
Monocytes Absolute: 0.4 10*3/uL (ref 0.1–1.0)
Monocytes Relative: 6 %
Neutro Abs: 5.6 10*3/uL (ref 1.7–7.7)
Neutrophils Relative %: 83 %
Platelets: 183 10*3/uL (ref 150–400)
RBC: 5.14 MIL/uL (ref 4.22–5.81)
RDW: 12.2 % (ref 11.5–15.5)
WBC: 6.7 10*3/uL (ref 4.0–10.5)

## 2016-12-26 LAB — URINALYSIS, ROUTINE W REFLEX MICROSCOPIC
Bacteria, UA: NONE SEEN
Bilirubin Urine: NEGATIVE
Glucose, UA: NEGATIVE mg/dL
Ketones, ur: 5 mg/dL — AB
Leukocytes, UA: NEGATIVE
Nitrite: NEGATIVE
Protein, ur: 30 mg/dL — AB
Specific Gravity, Urine: 1.026 (ref 1.005–1.030)
pH: 6 (ref 5.0–8.0)

## 2016-12-26 LAB — COMPREHENSIVE METABOLIC PANEL
ALT: 40 U/L (ref 17–63)
AST: 37 U/L (ref 15–41)
Albumin: 4.3 g/dL (ref 3.5–5.0)
Alkaline Phosphatase: 57 U/L (ref 38–126)
Anion gap: 10 (ref 5–15)
BUN: 17 mg/dL (ref 6–20)
CO2: 26 mmol/L (ref 22–32)
Calcium: 9.2 mg/dL (ref 8.9–10.3)
Chloride: 100 mmol/L — ABNORMAL LOW (ref 101–111)
Creatinine, Ser: 1.05 mg/dL (ref 0.61–1.24)
GFR calc Af Amer: >60 mL/min (ref >60)
GFR calc non Af Amer: >60 mL/min (ref >60)
Glucose, Bld: 105 mg/dL — ABNORMAL HIGH (ref 65–99)
Potassium: 3.9 mmol/L (ref 3.5–5.1)
Sodium: 136 mmol/L (ref 135–145)
Total Bilirubin: 0.5 mg/dL (ref 0.3–1.2)
Total Protein: 7.8 g/dL (ref 6.5–8.1)

## 2016-12-26 MED ORDER — HYDROMORPHONE HCL 1 MG/ML IJ SOLN
1.0000 mg | Freq: Once | INTRAMUSCULAR | Status: AC
  Administered 2016-12-26: 1 mg via INTRAVENOUS
  Filled 2016-12-26: qty 1

## 2016-12-26 MED ORDER — OXYCODONE-ACETAMINOPHEN 5-325 MG PO TABS: 1.0000 | ORAL_TABLET | ORAL | 0 refills | Status: DC | PRN

## 2016-12-26 MED ORDER — TAMSULOSIN HCL 0.4 MG PO CAPS: 0.4000 mg | ORAL_CAPSULE | Freq: Every day | ORAL | 0 refills | Status: DC

## 2016-12-26 MED ORDER — OXYCODONE-ACETAMINOPHEN 5-325 MG PO TABS: 1.0000 | ORAL_TABLET | Freq: Four times a day (QID) | ORAL | 0 refills | Status: DC | PRN

## 2016-12-26 MED ORDER — KETOROLAC TROMETHAMINE 30 MG/ML IJ SOLN
30.0000 mg | Freq: Once | INTRAMUSCULAR | Status: AC
  Administered 2016-12-26: 30 mg via INTRAVENOUS
  Filled 2016-12-26: qty 1

## 2016-12-26 MED ORDER — HYDROMORPHONE HCL 1 MG/ML IJ SOLN
0.5000 mg | Freq: Once | INTRAMUSCULAR | Status: AC
  Administered 2016-12-26: 0.5 mg via INTRAVENOUS
  Filled 2016-12-26: qty 1

## 2016-12-26 MED ORDER — ONDANSETRON HCL 4 MG/2ML IJ SOLN
4.0000 mg | Freq: Once | INTRAMUSCULAR | Status: AC
  Administered 2016-12-26: 4 mg via INTRAVENOUS
  Filled 2016-12-26: qty 2

## 2016-12-26 MED ORDER — ONDANSETRON 4 MG PO TBDP: ORAL_TABLET | ORAL | 0 refills | Status: DC

## 2016-12-26 NOTE — ED Triage Notes (Signed)
Lt flank pain started around 12 today.  Extensive hx of kidney stones

## 2016-12-26 NOTE — Discharge Instructions (Signed)
Follow-up with your urologist this week. °

## 2016-12-28 LAB — URINE CULTURE: Culture: NO GROWTH

## 2016-12-29 MED FILL — Oxycodone w/ Acetaminophen Tab 5-325 MG: ORAL | Qty: 6 | Status: AC

## 2017-01-01 NOTE — ED Provider Notes (Signed)
[facility] Provider Note   CSN: [id number] Arrival date & time: 12/26/16  1503     History   Chief Complaint Chief Complaint  Patient presents with  . Flank Pain    HPI Thomas Howell is a 26 y.o. male.  Patient complains of severe flank pain. Patient has a history of numerous kidney stone   The history is provided by the patient. No language interpreter was used.  Flank Pain  This is a new problem. The current episode started 3 to 5 hours ago. The problem occurs constantly. The problem has not changed since onset.Pertinent negatives include no chest pain, no abdominal pain and no headaches. Nothing aggravates the symptoms. Nothing relieves the symptoms. He has tried nothing for the symptoms.    Past Medical History:  Diagnosis Date  . Complication of anesthesia   . History of kidney stones   . Left ureteral stone   . Migraine   . PONV (postoperative nausea and vomiting)   . Renal calculi    bilateral per ct 07-26-2016  . Scoliosis of lumbar spine 1994   treated at [facility] until age 67  . Wears glasses     Patient Active Problem List   Diagnosis Date Noted  . Neck pain 02/04/2016  . Hydronephrosis, left 08/19/2015  . Hydronephrosis determined by ultrasound   . Kidney stone on left side 08/18/2015  . Nephrolithiasis 08/18/2015  . Kidney stone 07/06/2015  . Left ureteral stone 07/06/2015  . Analgesic rebound headache 06/05/2014  . Migraine headache without aura 05/30/2014    Past Surgical History:  Procedure Laterality Date  . CYSTOSCOPY W/ RETROGRADES Right 07/06/2015   Procedure: CYSTOSCOPY WITH RETROGRADE PYELOGRAM;  Surgeon: Jerilee Field, MD;  Location: [facility];  Service: Urology;  Laterality: Right;  . CYSTOSCOPY W/ URETERAL STENT PLACEMENT Left 08/08/2015   Procedure: CYSTOSCOPY WITH STENT REPLACEMENT;  Surgeon: Jerilee Field, MD;  Location: [facility];  Service: Urology;  Laterality: Left;  . CYSTOSCOPY WITH RETROGRADE  PYELOGRAM, URETEROSCOPY AND STENT PLACEMENT Left 06/24/2014   Procedure: CYSTOSCOPY WITH RETROGRADE PYELOGRAM,  AND STENT PLACEMENT;  Surgeon: Magdalene Molly, MD;  Location[facility];  Service: Urology;  Laterality: Left;  . CYSTOSCOPY WITH RETROGRADE PYELOGRAM, URETEROSCOPY AND STENT PLACEMENT Left 07/05/2014   Procedure: CYSTO/LEFT URETEROSCOPY/LEFT RETROGRADE PYELOGRAM/LEFT STENT PLACEMENT;  Surgeon: Jerilee Field, MD;  Location: [facility];  Service: Urology;  Laterality: Left;  . CYSTOSCOPY WITH RETROGRADE PYELOGRAM, URETEROSCOPY AND STENT PLACEMENT Left 07/06/2015   Procedure: CYSTOSCOPY WITH RETROGRADE PYELOGRAM, URETEROSCOPY , LASER, STENT PLACEMENT and BASKET EXTRACTION;  Surgeon: Jerilee Field, MD;  Location[facility];  Service: Urology;  Laterality: Left;  . CYSTOSCOPY WITH RETROGRADE PYELOGRAM, URETEROSCOPY AND STENT PLACEMENT Left 08/19/2015   Procedure: CYSTOSCOPY WITH RETROGRADE PYELOGRAM, URETEROSCOPY AND STENT PLACEMENT;  Surgeon: Jerilee Field, MD;  [facility];  Service: Urology;  Laterality: Left;  . CYSTOSCOPY WITH URETEROSCOPY AND STENT PLACEMENT Left 01/16/2016   Procedure: CYSTOSCOPY WITH LEFT RETROGRADE PYELOGRAM  LEFT DIGITAL URETEROSCOPY AND PLACEMENT LEFT URETERAL STENT;  Surgeon: Jerilee Field, MD;  Location[facility];  Service: Urology;  Laterality: Left;  . CYSTOSCOPY WITH URETEROSCOPY, STONE BASKETRY AND STENT PLACEMENT Left 02/13/2016   Procedure: CYSTOSCOPY WITH LEFT URETEROSCOPY, HOLMIUM LASER AND STENT PLACEMENT;  Surgeon: Jerilee Field, MD;  [facility];  Service: Urology;  Laterality: Left;  . CYSTOSCOPY/RETROGRADE/URETEROSCOPY/STONE EXTRACTION WITH BASKET Left 08/08/2015   Procedure: CYSTOSCOPY/RETROGRADE/URETEROSCOPY/STONE EXTRACTION WITH BASKET;  Surgeon: Jerilee Field, MD;  Location: [facility];  Service: Urology;  Laterality: Left;  . CYSTOSCOPY/URETEROSCOPY/HOLMIUM LASER/STENT PLACEMENT Left 07/30/2016    Procedure: CYSTOSCOPY/URETEROSCOPY/HOLMIUM LASER/STENT PLACEMENT;  Surgeon: Jerilee FieldMatthew Eskridge, MD;  [facility] [place];  Service: Urology;  Laterality: Left;  . EXTRACORPOREAL SHOCK WAVE LITHOTRIPSY Left 07-07-2015  &  12-26-2014  . 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 HOLMIUM LASER APPLICATION Left 07/05/2014   Procedure: LASER LITHO;  Surgeon: Jerilee FieldMatthew Eskridge, MD;  [facility] [place];  Service: Urology;  Laterality: Left;  . HOLMIUM LASER APPLICATION Left 08/08/2015   Procedure: HOLMIUM LASER  WITH LITHOTRIPSY ;  Surgeon: Jerilee FieldMatthew Eskridge, MD;  [facility] [place];  Service: Urology;  Laterality: Left;  . LYMPH GLAND EXCISION  2003   neck--  benign       Home Medications    Prior to Admission medications   Medication Sig Start Date End Date Taking? Authorizing Provider  brompheniramine-pseudoephedrine-DM 30-2-10 MG/5ML syrup Take 5 mLs by mouth 4 (four) times daily as needed (for cough).  12/24/16  Yes Historical Provider, MD  lamoTRIgine (LAMICTAL) 100 MG tablet Take 1 tablet (100 mg total) by mouth daily. Take 1/2 po qd x 5 days, then 1/2 po bid x 5 d, then 1/2 po tid x 5d then 1 po bid Patient taking differently: Take 100 mg by mouth 2 (two) times daily.  10/18/16  Yes Asa Lenteichard A Sater, MD  levETIRAcetam (KEPPRA) 750 MG tablet Take one po qAM and 2 po q HS Patient taking differently: Take 750-1,500 mg by mouth 2 (two) times daily. 750mg  in the morning and 1500mg  at bedtime 10/18/16  Yes Asa Lenteichard A Sater, MD  Phenyleph-Doxyl-DM-Aspirin 7.8-6.25-10-500 MG TBEF Take 1 tablet by mouth daily as needed (for cold and flu symptoms).   Yes Historical Provider, MD  predniSONE (DELTASONE) 20 MG tablet Take 60 mg by mouth daily. 5 day course starting on 12/25/2015 12/24/16  Yes Historical Provider, MD  rizatriptan (MAXALT-MLT) 10 MG disintegrating tablet Take 1 tablet (10 mg total) by  mouth as needed for migraine. May repeat in 2 hours if needed; max 2 per 24 hrs 10/18/16  Yes Asa Lenteichard A Sater, MD  triamcinolone ointment (KENALOG) 0.1 % Apply 1 application topically 2 (two) times daily. 11/29/16  Yes Merlyn AlbertWilliam S Luking, MD  amoxicillin-clavulanate (AUGMENTIN) 875-125 MG tablet Take 1 tablet by mouth 2 (two) times daily. 10 day course starting on 12/24/2016 12/24/16   Historical Provider, MD  ondansetron (ZOFRAN ODT) 4 MG disintegrating tablet 4mg  ODT q4 hours prn nausea/vomit 12/26/16   Bethann BerkshireJoseph Aras Albarran, MD  oxyCODONE-acetaminophen (PERCOCET/ROXICET) 5-325 MG tablet Take 1 tablet by mouth every 4 (four) hours as needed. 12/26/16   Bethann BerkshireJoseph Jillane Po, MD  oxyCODONE-acetaminophen (PERCOCET/ROXICET) 5-325 MG tablet Take 1 tablet by mouth every 6 (six) hours as needed. 12/26/16   Bethann BerkshireJoseph Hally Colella, MD  Potassium Citrate 15 MEQ (1620 MG) TBCR Take 2 tablets by mouth 2 (two) times daily. 10/06/16   Historical Provider, MD  tamsulosin (FLOMAX) 0.4 MG CAPS capsule Take 1 capsule (0.4 mg total) by mouth daily. 12/26/16   Bethann BerkshireJoseph Maralee Higuchi, MD    Family History Family History  Problem Relation Age of Onset  . Cancer Father   . Kidney Stones Mother   . Cancer Other   . Hypertension Other   . Hyperlipidemia Other   . Stroke Other   . Kidney Stones Brother     Social History Social History  Substance Use Topics  . Smoking status: Former Smoker    Years: 2.00    Quit date: 07/29/2014  . Smokeless tobacco: Never Used     Comment: social smoker  . Alcohol use 0.0 oz/week     Comment: occ     Allergies   Codeine and Adhesive [tape]   Review of Systems Review of Systems  Constitutional: Negative for appetite change and fatigue.  HENT: Negative for congestion, ear discharge and sinus pressure.   Eyes: Negative for discharge.  Respiratory: Negative for cough.   Cardiovascular: Negative for chest pain.  Gastrointestinal: Negative for abdominal pain and diarrhea.  Genitourinary: Positive for flank  pain. Negative for frequency and hematuria.  Musculoskeletal: Negative for back pain.  Skin: Negative for rash.  Neurological: Negative for seizures and headaches.  Psychiatric/Behavioral: Negative for hallucinations.     Physical Exam Updated Vital Signs BP 133/87 (BP Location: Left Arm)   Pulse 88   Temp 98.1 F (36.7 C) (Oral)   Resp 18   Ht 5\' 9"  (1.753 m)   Wt 210 lb (95.3 kg)   SpO2 96%   BMI 31.01 kg/m   Physical Exam  Constitutional: He is oriented to person, place, and time. He appears well-developed.  HENT:  Head: Normocephalic.  Eyes: Conjunctivae and EOM are normal. No scleral icterus.  Neck: Neck supple. No thyromegaly present.  Cardiovascular: Normal rate and regular rhythm.  Exam reveals no gallop and no friction rub.   No murmur heard. Pulmonary/Chest: No stridor. He has no wheezes. He has no rales. He exhibits no tenderness.  Abdominal: He exhibits no distension. There is no tenderness. There is no rebound.  Genitourinary:  Genitourinary Comments: Tender left flank  Musculoskeletal: Normal range of motion. He exhibits no edema.  Lymphadenopathy:    He has no cervical adenopathy.  Neurological: He is oriented to person, place, and time. He exhibits normal muscle tone. Coordination normal.  Skin: No rash noted. No erythema.  Psychiatric: He has a normal mood and affect. His behavior is normal.     ED Treatments / Results  Labs (all labs ordered are listed, but only abnormal results are displayed) Labs Reviewed  COMPREHENSIVE METABOLIC PANEL - Abnormal; Notable for the following:       Result Value   Chloride 100 (*)    Glucose, Bld 105 (*)    All other components within normal limits  URINALYSIS, ROUTINE W REFLEX MICROSCOPIC - Abnormal; Notable for the following:    APPearance HAZY (*)    Hgb urine dipstick LARGE (*)    Ketones, ur 5 (*)    Protein, ur 30 (*)    All other components within normal limits  URINE CULTURE  CBC WITH  DIFFERENTIAL/PLATELET    EKG  EKG Interpretation None       Radiology No results found.  Procedures Procedures (including critical care time)  Medications Ordered in ED Medications  HYDROmorphone (DILAUDID) injection 1 mg (1 mg Intravenous Given 12/26/16 1617)  ondansetron (ZOFRAN) injection 4 mg (4 mg Intravenous Given 12/26/16 1615)  ketorolac (TORADOL) 30 MG/ML injection 30 mg (30 mg Intravenous Given 12/26/16 1617)  HYDROmorphone (DILAUDID) injection 1 mg (1 mg Intravenous Given 12/26/16 1654)  HYDROmorphone (DILAUDID) injection 0.5 mg (0.5 mg Intravenous Given 12/26/16 1930)     Initial Impression / Assessment and Plan / ED Course  I have reviewed the triage vital signs and the nursing notes.  Pertinent labs & imaging results that were available during my care  of the patient were reviewed by me and considered in my medical decision making (see chart for details).  Clinical Course     Patient with kidney stone. He is sent home with pain medicine and nausea medicine and referred to urology  Final Clinical Impressions(s) / ED Diagnoses   Final diagnoses:  Kidney stone    New Prescriptions Discharge Medication List as of 12/26/2016  7:18 PM    START taking these medications   Details  ondansetron (ZOFRAN ODT) 4 MG disintegrating tablet 4mg  ODT q4 hours prn nausea/vomit, Print    !! oxyCODONE-acetaminophen (PERCOCET/ROXICET) 5-325 MG tablet Take 1 tablet by mouth every 4 (four) hours as needed., Starting Sun 12/26/2016, Print    !! oxyCODONE-acetaminophen (PERCOCET/ROXICET) 5-325 MG tablet Take 1 tablet by mouth every 6 (six) hours as needed., Starting Sun 12/26/2016, Print     !! - Potential duplicate medications found. Please discuss with provider.       Bethann Berkshire, MD 01/01/17 [phone]

## 2017-01-31 DIAGNOSIS — G43909 Migraine, unspecified, not intractable, without status migrainosus: Secondary | ICD-10-CM | POA: Diagnosis not present

## 2017-01-31 DIAGNOSIS — R51 Headache: Secondary | ICD-10-CM | POA: Diagnosis not present

## 2017-02-01 IMAGING — CT CT RENAL STONE PROTOCOL
2 of 4 series · 17 of 46 positions shown, 19 images · non-contrast
Comparison: CT scan 06/15/2016

CLINICAL DATA: Left flank pain today.  History of kidney stones.

EXAM:
CT ABDOMEN AND PELVIS WITHOUT CONTRAST
TECHNIQUE: Multidetector CT imaging of the abdomen and pelvis was performed
following the standard protocol without IV contrast.

[Series 2: axial st · axial · 0.76mm/px · z∈[-452,-92]mm · 14 of 79 slices shown, 16 images]
[im 4/79  soft-tissue]
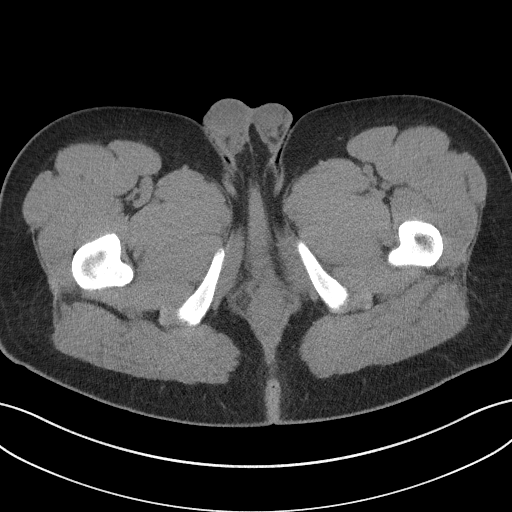
[im 4/79  bone]
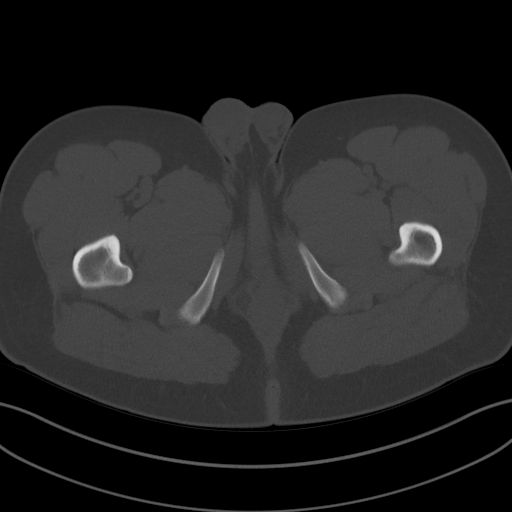
[im 10/79  soft-tissue]
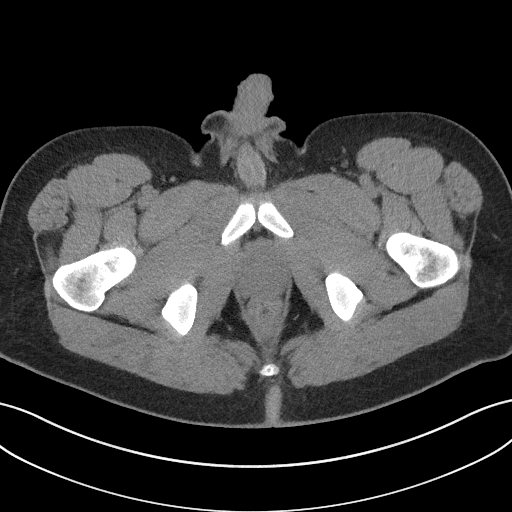
[im 16/79  soft-tissue]
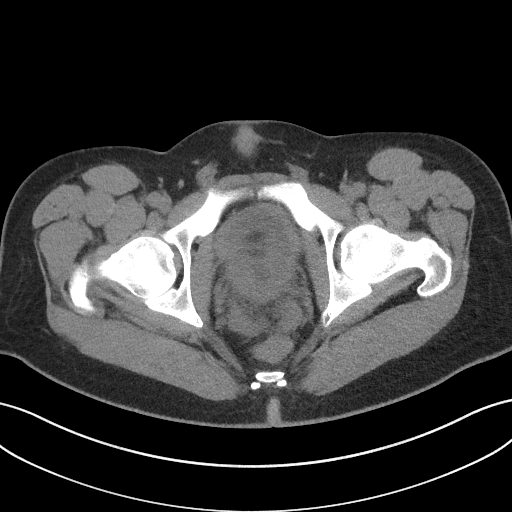
[im 22/79  soft-tissue]
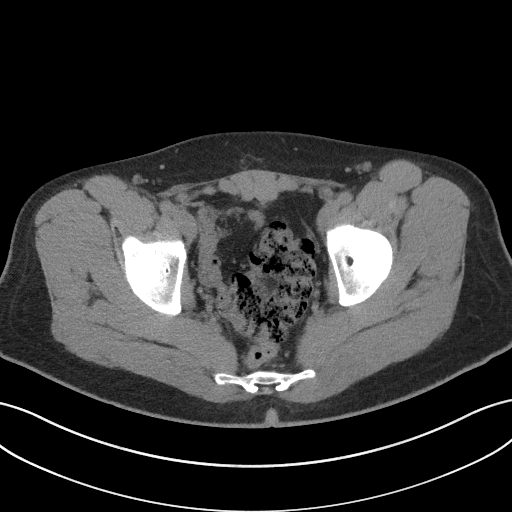
[im 28/79  soft-tissue]
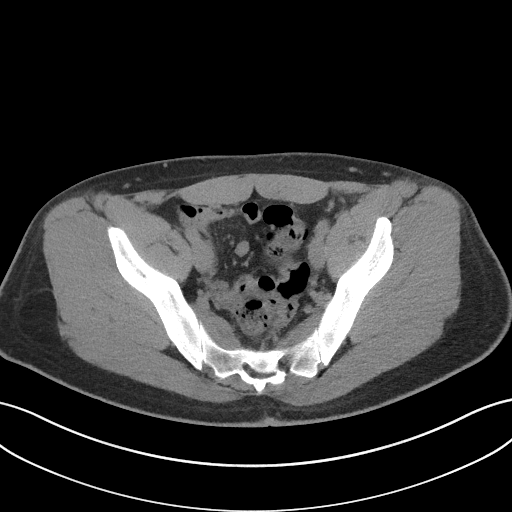
[im 31/79  soft-tissue]
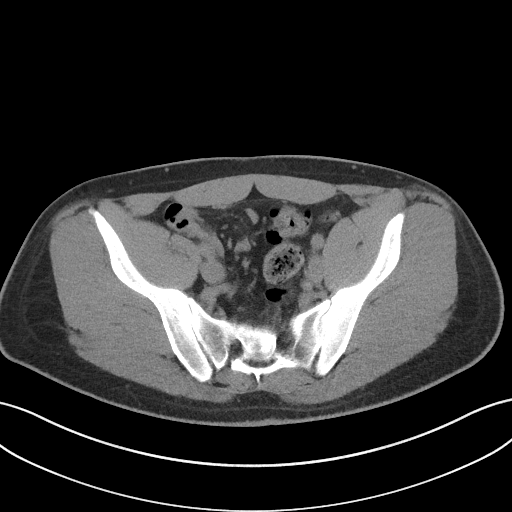
[im 37/79  soft-tissue]
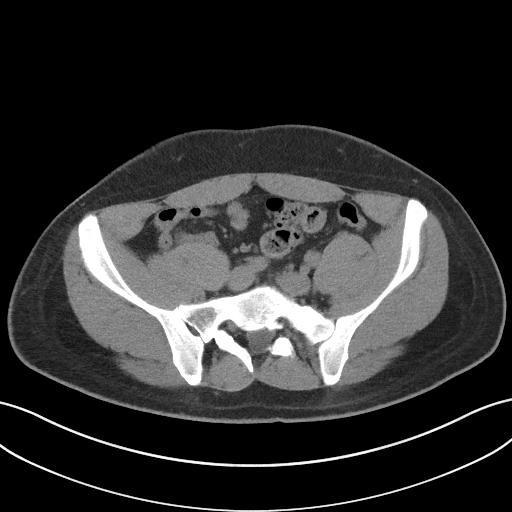
[im 43/79  soft-tissue]
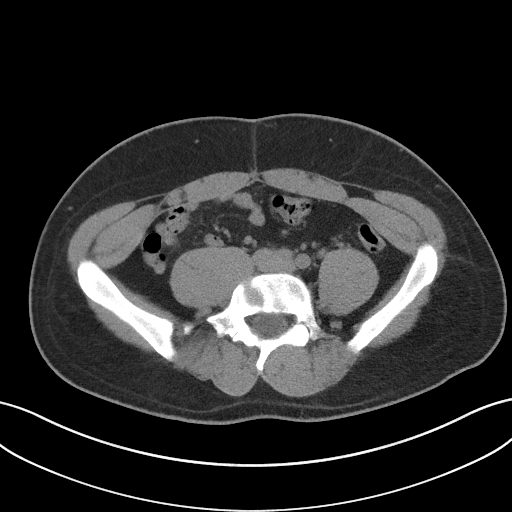
[im 49/79  soft-tissue]
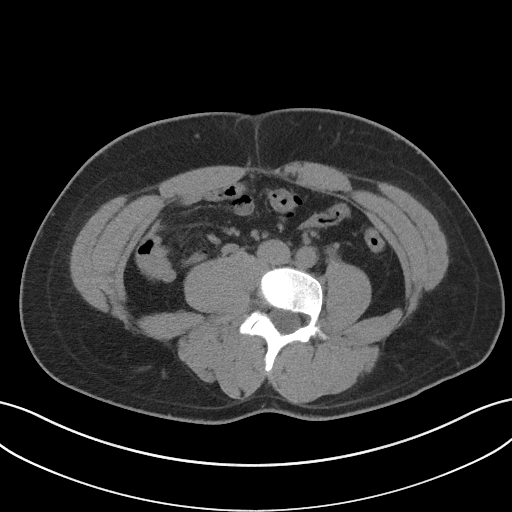
[im 49/79  bone]
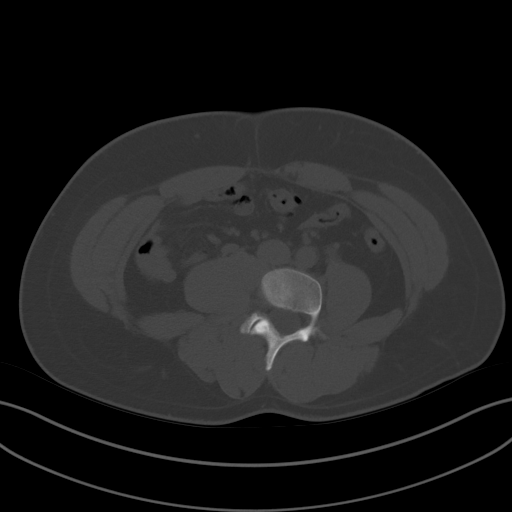
[im 52/79  soft-tissue]
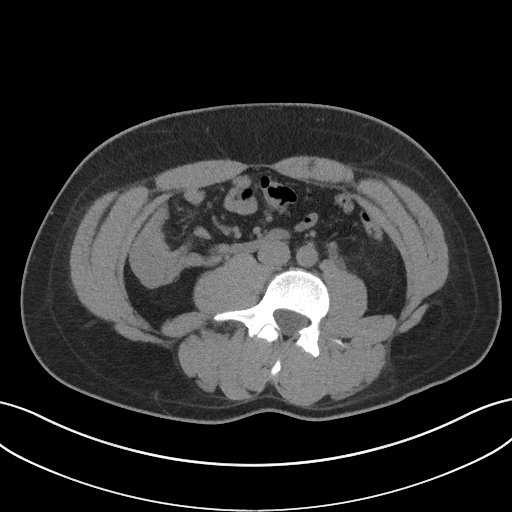
[im 58/79  soft-tissue]
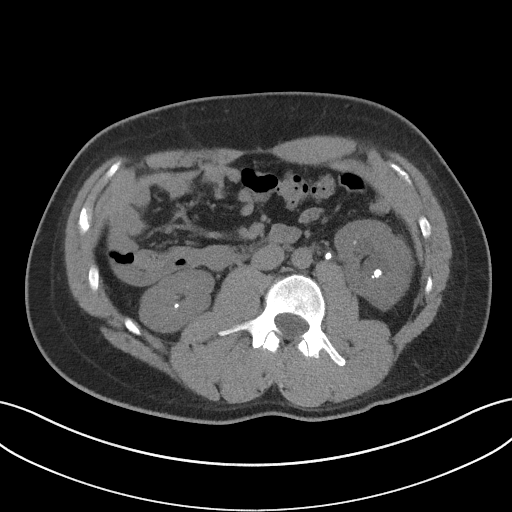
[im 64/79  soft-tissue]
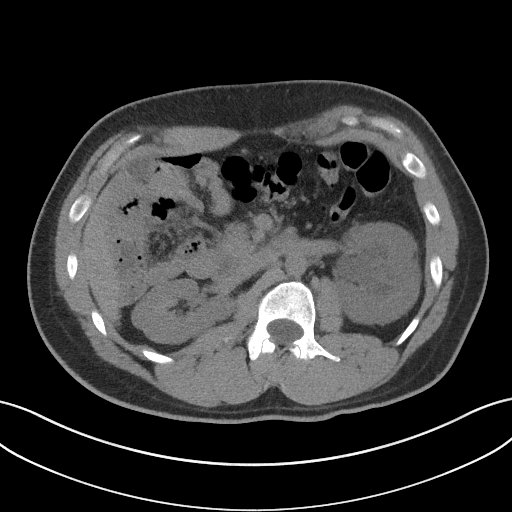
[im 70/79  soft-tissue]
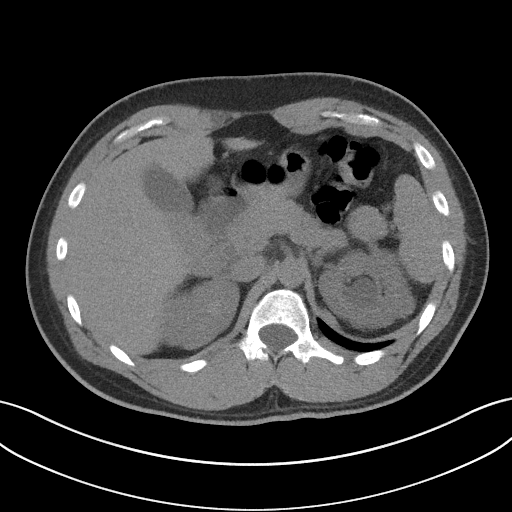
[im 76/79  soft-tissue]
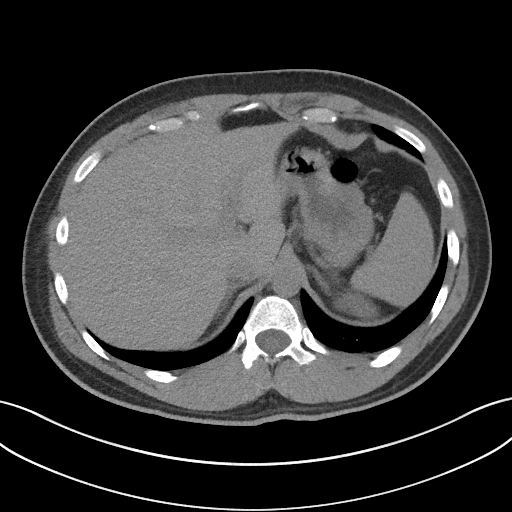

[Series 4: coronal st · coronal · 0.80mm/px · 3 of 82 slices shown]
[im 28/82  soft-tissue]
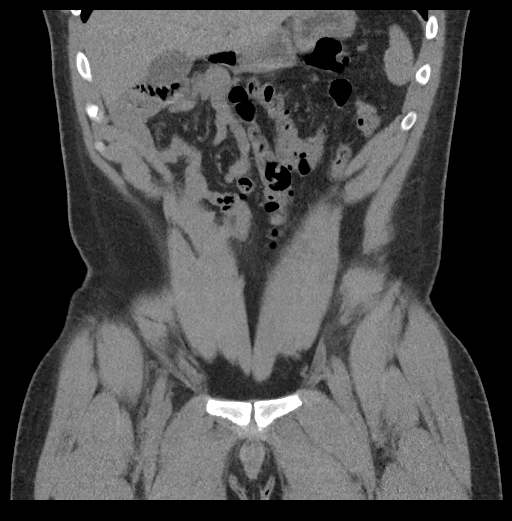
[im 37/82  soft-tissue]
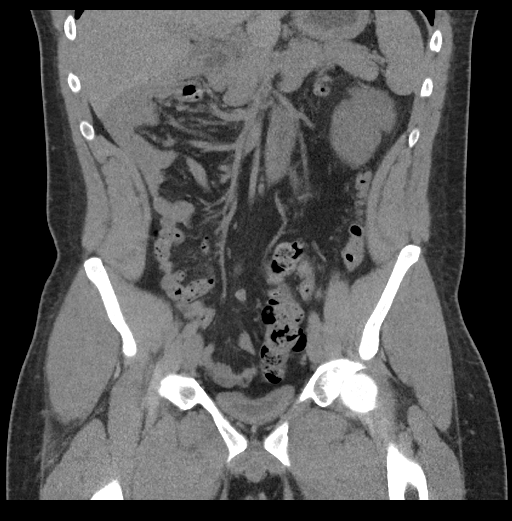
[im 46/82  soft-tissue]
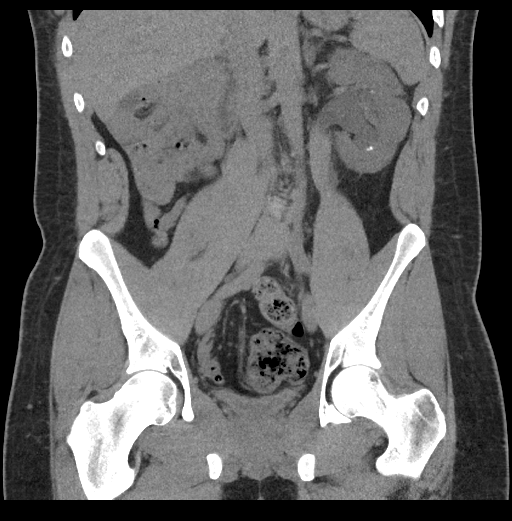

[17 of 46 positions shown; findings below may reference images not displayed]

FINDINGS: Lower chest: The lung bases are minimally visualize. No significant
abnormality.

Hepatobiliary: The liver is not completely imaged. No focal hepatic
lesions or intrahepatic biliary dilatation. The gallbladder is
normal. No common bile duct dilatation.

Pancreas: No mass, inflammation or ductal dilatation.

Spleen: Incompletely imaged.  Normal size.  No focal lesions.

Adrenals/Urinary Tract: Moderate to Marked left-sided
hydronephrosis. There are 2 left ureteral calculi. One is located
near the UPJ and measures 4.5 x 4 mm. A slightly lower calculus is
noted at the L3 level and this measures 5.5 x 4 mm. No distal left
ureteral calculi. No right-sided ureteral calculi or hydronephrosis.
There are numerous bilateral renal calculi.

Stomach/Bowel: The stomach, duodenum, small bowel and colon are
grossly normal without oral contrast. No inflammatory changes, mass
lesions or obstructive findings.

Vascular/Lymphatic: The aorta is normal in caliber. No
atherosclerotic calcifications. No mesenteric or retroperitoneal
mass or adenopathy.

Other: The bladder, prostate gland and seminal vesicles are
unremarkable. No pelvic mass or adenopathy. No free pelvic fluid
collections. No inguinal mass or adenopathy.

Musculoskeletal: Significant left convex lumbar scoliosis but no
acute bony findings.
IMPRESSION: 1. Two obstructing left ureteral calculi as described above.
Moderate-to-marked left-sided hydroureteronephrosis.
2. Numerous bilateral renal calculi.
3. No other significant abdominal/pelvic findings.

## 2017-02-08 ENCOUNTER — Telehealth: Payer: Self-pay | Admitting: Neurology

## 2017-02-08 NOTE — Telephone Encounter (Signed)
Patient called office in reference to lamoTRIgine (LAMICTAL) 100 MG tablet.  Per patient his migraines have not gotten any better, and he is not sure if his strength is needing to be increased.  Please call

## 2017-02-09 NOTE — Telephone Encounter (Signed)
[facility] to address continued h/a's./fim

## 2017-02-09 NOTE — Telephone Encounter (Signed)
I have spoken with Sherrill RaringRuss this morning.   Per RAS, he would like to see him, possibly discuss Botox for h/a's.  Appt. given Monday 02-14-17 at 1430, but he will arrive at 1pm./fim

## 2017-02-14 ENCOUNTER — Telehealth: Payer: Self-pay | Admitting: Neurology

## 2017-02-14 ENCOUNTER — Ambulatory Visit (INDEPENDENT_AMBULATORY_CARE_PROVIDER_SITE_OTHER): Payer: BLUE CROSS/BLUE SHIELD | Admitting: Neurology

## 2017-02-14 ENCOUNTER — Encounter: Payer: Self-pay | Admitting: Neurology

## 2017-02-14 VITALS — HR 58 | Resp 14 | Ht 69.0 in | Wt 224.0 lb

## 2017-02-14 DIAGNOSIS — M542 Cervicalgia: Secondary | ICD-10-CM

## 2017-02-14 DIAGNOSIS — IMO0002 Reserved for concepts with insufficient information to code with codable children: Secondary | ICD-10-CM

## 2017-02-14 DIAGNOSIS — G43709 Chronic migraine without aura, not intractable, without status migrainosus: Secondary | ICD-10-CM | POA: Diagnosis not present

## 2017-02-14 NOTE — Progress Notes (Signed)
[facility]  PATIENT: Thomas Howell DOB: 1991/01/12  REFERRING DOCTOR OR PCP:  Ardyth Gal SOURCE: patient, records in EMR  _________________________________   HISTORICAL  CHIEF COMPLAINT:  Chief Complaint  Patient presents with  . Headaches    Sts. no relief of h/a's with Keppra 750mg  in the am, 1500mg  qhs, and Lamictal 100mg  bid.  PRN Maxalt less effective than it used to be./fim  . Neck Pain    Sts. right sided neck pain is worse, pops when he turns his head to the right/fim    HISTORY OF PRESENT ILLNESS:  Torien Ramroop  is a 26 yo man who has had frequent migraine headaches since age 26.  They have progressed and continue to occur frequently, despite multiple prophylactic agents.     Currently he is on Lamictal and Keppra and is experiencing about 18 headaches a month (18/30 a month) lasting > 4 hours each day.   He has some occipital headache pain every day (30/30 a month) but milder pain sometimes responds to Excedrin Migraine  A typical headache starts in the occiput and then radiates to the eye (usually on the right) with a pounding quality.      He has no preceding aura.   He gets nausea and vomiting.   Moving increases the headache and laying in bed in a dark quiet room helps.    He has associated phonophobia and photophobia. Most headaches are random but some are triggered by strong smells such as perfume.  Maxalt helps some of the migraine headaches.  Often he has to take 2 over the day to get rid of the headache .   He uses about 10/month often in combination with Aleve.   Phenergan helps nausea    If he falls asleep, pain is oftebetter upon awakening.     HA treatment History:  On topiramate, the frequency dropped to 1/week but it caused him to have many more kidney stones. Amitriptyline caused grogginess the next day.   Nortriptyline had not helped.    Keppra and lamotriine did not help the headache frequency much.  Aleve and other NSAID's by  themself have not helped.  He sleeps well most nights.    REVIEW OF SYSTEMS: Constitutional: No fevers, chills, sweats, or change in appetite Eyes: No visual changes, double vision, eye pain Ear, nose and throat: No hearing loss, ear pain, nasal congestion, sore throat Cardiovascular: No chest pain, palpitations Respiratory: No shortness of breath at rest or with exertion.   No wheezes GastrointestinaI: No nausea, vomiting, diarrhea, abdominal pain, fecal incontinence Genitourinary: No dysuria, urinary retention or frequency.  He has frequent kidney stones (sees Dr. Mena Goes at [facility]) Musculoskeletal: No neck pain, back pain Integumentary: No rash, pruritus, skin lesions Neurological: as above Psychiatric: No depression at this time.  No anxiety Endocrine: No palpitations, diaphoresis, change in appetite, change in weigh or increased thirst Hematologic/Lymphatic: No anemia, purpura, petechiae. Allergic/Immunologic: No itchy/runny eyes, nasal congestion, recent allergic reactions, rashes  ALLERGIES: Allergies  Allergen Reactions  . Codeine Swelling    Lips swell//  This includes cough syrup w/ codeine  . Adhesive [Tape] Rash    HOME MEDICATIONS:  Current Outpatient Prescriptions:  .  brompheniramine-pseudoephedrine-DM 30-2-10 MG/5ML syrup, Take 5 mLs by mouth 4 (four) times daily as needed (for cough). , Disp: , Rfl: 0 .  lamoTRIgine (LAMICTAL) 100 MG tablet, Take 1 tablet (100 mg total) by mouth daily. Take 1/2 po qd x 5 days, then  1/2 po bid x 5 d, then 1/2 po tid x 5d then 1 po bid (Patient taking differently: Take 100 mg by mouth 2 (two) times daily. ), Disp: 60 tablet, Rfl: 11 .  levETIRAcetam (KEPPRA) 750 MG tablet, Take one po qAM and 2 po q HS (Patient taking differently: Take 750-1,500 mg by mouth 2 (two) times daily. 750mg  in the morning and 1500mg  at bedtime), Disp: 90 tablet, Rfl: 6 .  rizatriptan (MAXALT-MLT) 10 MG disintegrating tablet, Take 1 tablet  (10 mg total) by mouth as needed for migraine. May repeat in 2 hours if needed; max 2 per 24 hrs, Disp: 10 tablet, Rfl: 6 .  ondansetron (ZOFRAN ODT) 4 MG disintegrating tablet, 4mg  ODT q4 hours prn nausea/vomit, Disp: 12 tablet, Rfl: 0  PAST MEDICAL HISTORY: Past Medical History:  Diagnosis Date  . Complication of anesthesia   . History of kidney stones   . Left ureteral stone   . Migraine   . PONV (postoperative nausea and vomiting)   . Renal calculi    bilateral per ct 07-26-2016  . Scoliosis of lumbar spine 1994   treated at [facility] until age 57  . Wears glasses     PAST SURGICAL HISTORY: Past Surgical History:  Procedure Laterality Date  . CYSTOSCOPY W/ RETROGRADES Right 07/06/2015   Procedure: CYSTOSCOPY WITH RETROGRADE PYELOGRAM;  Surgeon: Jerilee Field, MD;  Location: [facility];  Service: Urology;  Laterality: Right;  . CYSTOSCOPY W/ URETERAL STENT PLACEMENT Left 08/08/2015   Procedure: CYSTOSCOPY WITH STENT REPLACEMENT;  Surgeon: Jerilee Field, MD;  Location: [facility];  Service: Urology;  Laterality: Left;  . CYSTOSCOPY WITH RETROGRADE PYELOGRAM, URETEROSCOPY AND STENT PLACEMENT Left 06/24/2014   Procedure: CYSTOSCOPY WITH RETROGRADE PYELOGRAM,  AND STENT PLACEMENT;  Surgeon: Magdalene Molly, MD;  Location: [facility];  Service: Urology;  Laterality: Left;  . CYSTOSCOPY WITH RETROGRADE PYELOGRAM, URETEROSCOPY AND STENT PLACEMENT Left 07/05/2014   Procedure: CYSTO/LEFT URETEROSCOPY/LEFT RETROGRADE PYELOGRAM/LEFT STENT PLACEMENT;  Surgeon: Jerilee Field, MD;  Location[facility];  Service: Urology;  Laterality: Left;  . CYSTOSCOPY WITH RETROGRADE PYELOGRAM, URETEROSCOPY AND STENT PLACEMENT Left 07/06/2015   Procedure: CYSTOSCOPY WITH RETROGRADE PYELOGRAM, URETEROSCOPY , LASER, STENT PLACEMENT and BASKET EXTRACTION;  Surgeon: Jerilee Field, MD;  Location: [facility];  Service: Urology;  Laterality: Left;  . CYSTOSCOPY WITH RETROGRADE PYELOGRAM,  URETEROSCOPY AND STENT PLACEMENT Left 08/19/2015   Procedure: CYSTOSCOPY WITH RETROGRADE PYELOGRAM, URETEROSCOPY AND STENT PLACEMENT;  Surgeon: Jerilee Field, MD;  Location: [facility];  Service: Urology;  Laterality: Left;  . CYSTOSCOPY WITH URETEROSCOPY AND STENT PLACEMENT Left 01/16/2016   Procedure: CYSTOSCOPY WITH LEFT RETROGRADE PYELOGRAM  LEFT DIGITAL URETEROSCOPY AND PLACEMENT LEFT URETERAL STENT;  Surgeon: Jerilee Field, MD;  Location: [facility];  Service: Urology;  Laterality: Left;  . CYSTOSCOPY WITH URETEROSCOPY, STONE BASKETRY AND STENT PLACEMENT Left 02/13/2016   Procedure: CYSTOSCOPY WITH LEFT URETEROSCOPY, HOLMIUM LASER AND STENT PLACEMENT;  Surgeon: Jerilee Field, MD;  Location: [facility];  Service: Urology;  Laterality: Left;  . CYSTOSCOPY/RETROGRADE/URETEROSCOPY/STONE EXTRACTION WITH BASKET Left 08/08/2015   Procedure: CYSTOSCOPY/RETROGRADE/URETEROSCOPY/STONE EXTRACTION WITH BASKET;  Surgeon: Jerilee Field, MD;  Location: [facility];  Service: Urology;  Laterality: Left;  . CYSTOSCOPY/URETEROSCOPY/HOLMIUM LASER/STENT PLACEMENT Left 07/30/2016   Procedure: CYSTOSCOPY/URETEROSCOPY/HOLMIUM LASER/STENT PLACEMENT;  Surgeon: Jerilee Field, MD;  Location: [facility];  Service: Urology;  Laterality: Left;  . EXTRACORPOREAL SHOCK WAVE LITHOTRIPSY Left 07-07-2015  &  12-26-2014  . 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 HOLMIUM LASER APPLICATION Left 07/05/2014   Procedure: LASER LITHO;  Surgeon: Jerilee FieldMatthew Eskridge, MD;  [facility] [place];  Service: Urology;  Laterality: Left;  . HOLMIUM LASER APPLICATION Left 08/08/2015   Procedure: HOLMIUM LASER  WITH LITHOTRIPSY ;  Surgeon: Jerilee FieldMatthew Eskridge, MD;  [facility] [place];  Service: Urology;  Laterality: Left;  . LYMPH GLAND EXCISION  2003   neck--  benign    FAMILY HISTORY: Family History  Problem Relation  Age of Onset  . Cancer Father   . Kidney Stones Mother   . Cancer Other   . Hypertension Other   . Hyperlipidemia Other   . Stroke Other   . Kidney Stones Brother     SOCIAL HISTORY:  Social History   Social History  . Marital status: Single    Spouse name: N/A  . Number of children: N/A  . Years of education: N/A   Occupational History  . Not on file.   Social History Main Topics  . Smoking status: Former Smoker    Years: 2.00    Quit date: 07/29/2014  . Smokeless tobacco: Never Used     Comment: social smoker  . Alcohol use 0.0 oz/week     Comment: occ  . Drug use: No  . Sexual activity: No   Other Topics Concern  . Not on file   Social History Narrative  . No narrative on file     PHYSICAL EXAM  Vitals:   02/14/17 1306  Pulse: (!) 58  Resp: 14  Weight: 224 lb (101.6 kg)  Height: 5\' 9"  (1.753 m)    Body mass index is 33.08 kg/m.   General: The patient is well-developed and well-nourished and in no acute distress  Neck: The neck is supple.  The neck is tender over the right splenius capitis muscle in the occipital nerve..  Neurologic Exam  Mental status: The patient is alert and oriented x 3 at the time of the examination. The patient has apparent normal recent and remote memory, with an apparently normal attention span and concentration ability.   Speech is normal.  Cranial nerves: Extraocular movements are full.  Facial strength and sensation are normal..  Trapezius and sternocleidomastoid strength is normal. No dysarthria is noted.  The tongue is midline, and the patient has symmetric elevation of the soft palate. No obvious hearing deficits are noted.  Motor:  Muscle bulk is normal.   Tone is normal. Strength is  5 / 5 in all 4 extremities.   Coordination: Cerebellar testing reveals good finger-nose-finger ilaterally.  Gait and station: Station is normal.   Gait is normal. Tandem gait is normal.  DTRs:  Deep tendon reflexes are normal and  symmetric in the arms and legs.     DIAGNOSTIC DATA (LABS, IMAGING, TESTING) - I reviewed patient records, labs, notes, testing and imaging myself where available.  Lab Results  Component Value Date   WBC 6.7 12/26/2016   HGB 15.9 12/26/2016   HCT 46.1 12/26/2016   MCV 89.7 12/26/2016   PLT 183 12/26/2016      Component Value Date/Time   NA 136 12/26/2016 1601   K 3.9 12/26/2016 1601   CL 100 (L) 12/26/2016 1601   CO2 26 12/26/2016 1601   GLUCOSE 105 (H) 12/26/2016 1601   BUN 17 12/26/2016 1601   CREATININE 1.05 12/26/2016 1601   CALCIUM 9.2 12/26/2016 1601  PROT 7.8 12/26/2016 1601   ALBUMIN 4.3 12/26/2016 1601   AST 37 12/26/2016 1601   ALT 40 12/26/2016 1601   ALKPHOS 57 12/26/2016 1601   BILITOT 0.5 12/26/2016 1601   GFRNONAA >60 12/26/2016 1601   GFRAA >60 12/26/2016 1601       ASSESSMENT AND PLAN  Neck pain  Chronic migraine    1.  As he has failed multiple (at least 5) prophylactic agents, we will consider Botox.   I went over the risks and benefits of Botox. He will sign an informed consent form and we will see if we can get authorization from his insurance company.  2.    Right splenius capitis trigger point injection with 80 mg Depo-Medrol in [place].  3.   Continue prn Maxalt. 4.   Return for Botox or call sooner if there are significant changes in the headache frequency or other new neurologic symptoms.    Analiyah Lechuga A. Epimenio Foot, MD, PhD 02/14/2017, 1:22 PM Certified in Neurology, Clinical Neurophysiology, Sleep Medicine, Pain Medicine and Neuroimaging  [facility] [address] [place], [place] [address] [phone]

## 2017-02-14 NOTE — Telephone Encounter (Signed)
Please call pt to schedule first botox inj per Dr Epimenio FootSater.

## 2017-02-17 NOTE — Telephone Encounter (Signed)
I called and scheduled the patient.  °

## 2017-02-20 ENCOUNTER — Emergency Department (HOSPITAL_COMMUNITY)
Admission: EM | Admit: 2017-02-20 | Discharge: 2017-02-20 | Disposition: A | Discharge status: Home / Self Care | Payer: BLUE CROSS/BLUE SHIELD | Attending: Emergency Medicine | Admitting: Emergency Medicine

## 2017-02-20 ENCOUNTER — Emergency Department (HOSPITAL_COMMUNITY): Payer: BLUE CROSS/BLUE SHIELD

## 2017-02-20 ENCOUNTER — Encounter (HOSPITAL_COMMUNITY): Payer: Self-pay

## 2017-02-20 DIAGNOSIS — N200 Calculus of kidney: Secondary | ICD-10-CM | POA: Diagnosis not present

## 2017-02-20 DIAGNOSIS — N201 Calculus of ureter: Secondary | ICD-10-CM | POA: Diagnosis not present

## 2017-02-20 DIAGNOSIS — R109 Unspecified abdominal pain: Secondary | ICD-10-CM | POA: Diagnosis not present

## 2017-02-20 DIAGNOSIS — Z87891 Personal history of nicotine dependence: Secondary | ICD-10-CM | POA: Insufficient documentation

## 2017-02-20 DIAGNOSIS — Z79899 Other long term (current) drug therapy: Secondary | ICD-10-CM | POA: Diagnosis not present

## 2017-02-20 DIAGNOSIS — N132 Hydronephrosis with renal and ureteral calculous obstruction: Secondary | ICD-10-CM | POA: Diagnosis not present

## 2017-02-20 LAB — CBC WITH DIFFERENTIAL/PLATELET
Basophils Absolute: 0 10*3/uL (ref 0.0–0.1)
Basophils Relative: 0 %
Eosinophils Absolute: 0 10*3/uL (ref 0.0–0.7)
Eosinophils Relative: 0 %
HCT: 43 % (ref 39.0–52.0)
Hemoglobin: 15.6 g/dL (ref 13.0–17.0)
Lymphocytes Relative: 14 %
Lymphs Abs: 1.7 10*3/uL (ref 0.7–4.0)
MCH: 31.2 pg (ref 26.0–34.0)
MCHC: 36.3 g/dL — ABNORMAL HIGH (ref 30.0–36.0)
MCV: 86 fL (ref 78.0–100.0)
Monocytes Absolute: 1 10*3/uL (ref 0.1–1.0)
Monocytes Relative: 8 %
Neutro Abs: 9.5 10*3/uL — ABNORMAL HIGH (ref 1.7–7.7)
Neutrophils Relative %: 78 %
Platelets: 177 10*3/uL (ref 150–400)
RBC: 5 MIL/uL (ref 4.22–5.81)
RDW: 12.3 % (ref 11.5–15.5)
WBC: 12.3 10*3/uL — ABNORMAL HIGH (ref 4.0–10.5)

## 2017-02-20 LAB — URINALYSIS, ROUTINE W REFLEX MICROSCOPIC
Bacteria, UA: NONE SEEN
Bilirubin Urine: NEGATIVE
Glucose, UA: NEGATIVE mg/dL
Ketones, ur: NEGATIVE mg/dL
Leukocytes, UA: NEGATIVE
Nitrite: NEGATIVE
Protein, ur: NEGATIVE mg/dL
Specific Gravity, Urine: 1.021 (ref 1.005–1.030)
Squamous Epithelial / LPF: NONE SEEN
pH: 5 (ref 5.0–8.0)

## 2017-02-20 LAB — BASIC METABOLIC PANEL
Anion gap: 9 (ref 5–15)
BUN: 21 mg/dL — ABNORMAL HIGH (ref 6–20)
CO2: 23 mmol/L (ref 22–32)
Calcium: 9.1 mg/dL (ref 8.9–10.3)
Chloride: 105 mmol/L (ref 101–111)
Creatinine, Ser: 1.27 mg/dL — ABNORMAL HIGH (ref 0.61–1.24)
GFR calc Af Amer: >60 mL/min (ref >60)
GFR calc non Af Amer: >60 mL/min (ref >60)
Glucose, Bld: 89 mg/dL (ref 65–99)
Potassium: 3.9 mmol/L (ref 3.5–5.1)
Sodium: 137 mmol/L (ref 135–145)

## 2017-02-20 MED ORDER — HYDROMORPHONE HCL 1 MG/ML IJ SOLN
1.0000 mg | Freq: Once | INTRAMUSCULAR | Status: AC
  Administered 2017-02-20: 1 mg via INTRAVENOUS
  Filled 2017-02-20: qty 1

## 2017-02-20 MED ORDER — ONDANSETRON 4 MG PO TBDP
4.0000 mg | ORAL_TABLET | Freq: Once | ORAL | Status: AC | PRN
  Administered 2017-02-20: 4 mg via ORAL
  Filled 2017-02-20: qty 1

## 2017-02-20 MED ORDER — ONDANSETRON HCL 4 MG/2ML IJ SOLN
4.0000 mg | Freq: Once | INTRAMUSCULAR | Status: AC
  Administered 2017-02-20: 4 mg via INTRAVENOUS
  Filled 2017-02-20: qty 2

## 2017-02-20 MED ORDER — HYDROMORPHONE HCL 1 MG/ML IJ SOLN
0.5000 mg | Freq: Once | INTRAMUSCULAR | Status: AC
  Administered 2017-02-20: 0.5 mg via INTRAVENOUS
  Filled 2017-02-20: qty 0.5

## 2017-02-20 MED ORDER — KETOROLAC TROMETHAMINE 30 MG/ML IJ SOLN
15.0000 mg | Freq: Once | INTRAMUSCULAR | Status: AC
  Administered 2017-02-20: 15 mg via INTRAVENOUS
  Filled 2017-02-20: qty 1

## 2017-02-20 MED ORDER — TAMSULOSIN HCL 0.4 MG PO CAPS: 0.4000 mg | ORAL_CAPSULE | Freq: Every day | ORAL | 0 refills | Status: DC

## 2017-02-20 MED ORDER — ONDANSETRON 4 MG PO TBDP: ORAL_TABLET | ORAL | 0 refills | Status: DC

## 2017-02-20 MED ORDER — OXYCODONE-ACETAMINOPHEN 5-325 MG PO TABS: 1.0000 | ORAL_TABLET | Freq: Four times a day (QID) | ORAL | 0 refills | Status: DC | PRN

## 2017-02-20 NOTE — ED Provider Notes (Signed)
[facility]ERGENCY DEPT Provider Note   CSN: [id number] Arrival date & time: 02/20/17  1038     History   Chief Complaint Chief Complaint  Patient presents with  . Flank Pain    HPI Thomas Howell is a 26 y.o. male.  HPI 26 year old Caucasian male past medical history significant for kidney stones presents to the ED today with complaints of left flank pain that started just today. Patient states the pain is sharp in nature. Radiates to his left lower quadrant. The pain is intermittent but has become constant. Endorses associated nausea and emesis. Patient denies any urinary symptoms. Denies any fevers, testicular pain, testicular swelling, penile discharge. States he has a history of kidney stones and this feels similar. He has tried his Phenergan at home with little relief. For several episodes of emesis today. He is requesting that we do not do a CT scan because he knows he has a kidney stone. Denies any fever, chills, headache, vision changes, abdominal pain, urinary symptoms, testicular pain, testicular swelling, change in bowel habits.   Past Medical History:  Diagnosis Date  . Complication of anesthesia   . History of kidney stones   . Left ureteral stone   . Migraine   . PONV (postoperative nausea and vomiting)   . Renal calculi    bilateral per ct 07-26-2016  . Scoliosis of lumbar spine 1994   treated at [facility] until age 36  . Wears glasses     Patient Active Problem List   Diagnosis Date Noted  . Chronic migraine 02/14/2017  . Neck pain 02/04/2016  . Hydronephrosis, left 08/19/2015  . Hydronephrosis determined by ultrasound   . Kidney stone on left side 08/18/2015  . Nephrolithiasis 08/18/2015  . Kidney stone 07/06/2015  . Left ureteral stone 07/06/2015  . Analgesic rebound headache 06/05/2014  . Migraine headache without aura 05/30/2014    Past Surgical History:  Procedure Laterality Date  . CYSTOSCOPY W/ RETROGRADES Right 07/06/2015   Procedure: CYSTOSCOPY  WITH RETROGRADE PYELOGRAM;  Surgeon: Jerilee Field, MD;  Location: [facility];  Service: Urology;  Laterality: Right;  . CYSTOSCOPY W/ URETERAL STENT PLACEMENT Left 08/08/2015   Procedure: CYSTOSCOPY WITH STENT REPLACEMENT;  Surgeon: Jerilee Field, MD;  Location: [facility];  Service: Urology;  Laterality: Left;  . CYSTOSCOPY WITH RETROGRADE PYELOGRAM, URETEROSCOPY AND STENT PLACEMENT Left 06/24/2014   Procedure: CYSTOSCOPY WITH RETROGRADE PYELOGRAM,  AND STENT PLACEMENT;  Surgeon: Magdalene Molly, MD;  Location: [facility];  Service: Urology;  Laterality: Left;  . CYSTOSCOPY WITH RETROGRADE PYELOGRAM, URETEROSCOPY AND STENT PLACEMENT Left 07/05/2014   Procedure: CYSTO/LEFT URETEROSCOPY/LEFT RETROGRADE PYELOGRAM/LEFT STENT PLACEMENT;  Surgeon: Jerilee Field, MD;  Location: [facility];  Service: Urology;  Laterality: Left;  . CYSTOSCOPY WITH RETROGRADE PYELOGRAM, URETEROSCOPY AND STENT PLACEMENT Left 07/06/2015   Procedure: CYSTOSCOPY WITH RETROGRADE PYELOGRAM, URETEROSCOPY , LASER, STENT PLACEMENT and BASKET EXTRACTION;  Surgeon: Jerilee Field, MD;  Location: [facility];  Service: Urology;  Laterality: Left;  . CYSTOSCOPY WITH RETROGRADE PYELOGRAM, URETEROSCOPY AND STENT PLACEMENT Left 08/19/2015   Procedure: CYSTOSCOPY WITH RETROGRADE PYELOGRAM, URETEROSCOPY AND STENT PLACEMENT;  Surgeon: Jerilee Field, MD;  Location: [facility];  Service: Urology;  Laterality: Left;  . CYSTOSCOPY WITH URETEROSCOPY AND STENT PLACEMENT Left 01/16/2016   Procedure: CYSTOSCOPY WITH LEFT RETROGRADE PYELOGRAM  LEFT DIGITAL URETEROSCOPY AND PLACEMENT LEFT URETERAL STENT;  Surgeon: Jerilee Field, MD;  Location: [facility];  Service: Urology;  Laterality: Left;  . CYSTOSCOPY WITH URETEROSCOPY, STONE BASKETRY AND  STENT PLACEMENT Left 02/13/2016   Procedure: CYSTOSCOPY WITH LEFT URETEROSCOPY, HOLMIUM LASER AND STENT PLACEMENT;  Surgeon: Jerilee Field, MD;  Location: [facility];  Service: Urology;   Laterality: Left;  . CYSTOSCOPY/RETROGRADE/URETEROSCOPY/STONE EXTRACTION WITH BASKET Left 08/08/2015   Procedure: CYSTOSCOPY/RETROGRADE/URETEROSCOPY/STONE EXTRACTION WITH BASKET;  Surgeon: Jerilee Field, MD;  Location: [facility];  Service: Urology;  Laterality: Left;  . CYSTOSCOPY/URETEROSCOPY/HOLMIUM LASER/STENT PLACEMENT Left 07/30/2016   Procedure: CYSTOSCOPY/URETEROSCOPY/HOLMIUM LASER/STENT PLACEMENT;  Surgeon: Jerilee Field, MD;  Location: [facility];  Service: Urology;  Laterality: Left;  . EXTRACORPOREAL SHOCK WAVE LITHOTRIPSY Left 07-07-2015  &  12-26-2014  . 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 HOLMIUM LASER APPLICATION Left 07/05/2014   Procedure: LASER LITHO;  Surgeon: Jerilee Field, MD;  Location: [facility];  Service: Urology;  Laterality: Left;  . HOLMIUM LASER APPLICATION Left 08/08/2015   Procedure: HOLMIUM LASER  WITH LITHOTRIPSY ;  Surgeon: Jerilee Field, MD;  Location: [facility];  Service: Urology;  Laterality: Left;  . LYMPH GLAND EXCISION  2003   neck--  benign       Home Medications    Prior to Admission medications   Medication Sig Start Date End Date Taking? Authorizing Provider  brompheniramine-pseudoephedrine-DM 30-2-10 MG/5ML syrup Take 5 mLs by mouth 4 (four) times daily as needed (for cough).  12/24/16   Historical Provider, MD  lamoTRIgine (LAMICTAL) 100 MG tablet Take 1 tablet (100 mg total) by mouth daily. Take 1/2 po qd x 5 days, then 1/2 po bid x 5 d, then 1/2 po tid x 5d then 1 po bid Patient taking differently: Take 100 mg by mouth 2 (two) times daily.  10/18/16   Asa Lente, MD  levETIRAcetam (KEPPRA) 750 MG tablet Take one po qAM and 2 po q HS Patient taking differently: Take 750-1,500 mg by mouth 2 (two) times daily. 750mg  in the morning and 1500mg  at bedtime 10/18/16   Asa Lente, MD  ondansetron  (ZOFRAN ODT) 4 MG disintegrating tablet 4mg  ODT q4 hours prn nausea/vomit 12/26/16   Bethann Berkshire, MD  rizatriptan (MAXALT-MLT) 10 MG disintegrating tablet Take 1 tablet (10 mg total) by mouth as needed for migraine. May repeat in 2 hours if needed; max 2 per 24 hrs 10/18/16   Asa Lente, MD    Family History Family History  Problem Relation Age of Onset  . Cancer Father   . Kidney Stones Mother   . Cancer Other   . Hypertension Other   . Hyperlipidemia Other   . Stroke Other   . Kidney Stones Brother     Social History Social History  Substance Use Topics  . Smoking status: Former Smoker    Years: 2.00    Quit date: 07/29/2014  . Smokeless tobacco: Never Used     Comment: social smoker  . Alcohol use 0.0 oz/week     Comment: occ     Allergies   Codeine and Adhesive [tape]   Review of Systems Review of Systems  Constitutional: Negative for chills and fever.  Gastrointestinal: Positive for nausea and vomiting.  Genitourinary: Positive for flank pain. Negative for discharge, dysuria, hematuria, penile pain, penile swelling, scrotal swelling, testicular pain and urgency.  All other systems reviewed and are negative.    Physical Exam Updated Vital Signs BP 123/85 (BP Location: Right Arm)   Pulse 73   Temp 98.1 F (36.7 C) (Oral)  Resp 18   Ht 5\' 8"  (1.727 m)   Wt 99.8 kg   SpO2 99%   BMI 33.45 kg/m   Physical Exam  Constitutional: He is oriented to person, place, and time. He appears well-developed and well-nourished. No distress.  Patient rocking back and forth on the exam bed due to pain. He is nontoxic appearing.  HENT:  Head: Normocephalic and atraumatic.  Mouth/Throat: Oropharynx is clear and moist.  Eyes: Conjunctivae are normal. Right eye exhibits no discharge. Left eye exhibits no discharge. No scleral icterus.  Neck: Normal range of motion. Neck supple. No thyromegaly present.  Cardiovascular: Normal rate, regular rhythm, normal heart sounds and  intact distal pulses.   Pulmonary/Chest: Effort normal and breath sounds normal.  Abdominal: Soft. Bowel sounds are normal. He exhibits no distension. There is no tenderness. There is CVA tenderness (left). There is no rigidity, no rebound and no guarding.  Musculoskeletal: Normal range of motion.  Lymphadenopathy:    He has no cervical adenopathy.  Neurological: He is alert and oriented to person, place, and time.  Skin: Skin is warm and dry. Capillary refill takes less than 2 seconds.  Nursing note and vitals reviewed.    ED Treatments / Results  Labs (all labs ordered are listed, but only abnormal results are displayed) Labs Reviewed  URINALYSIS, ROUTINE W REFLEX MICROSCOPIC - Abnormal; Notable for the following:       Result Value   Hgb urine dipstick MODERATE (*)    All other components within normal limits  BASIC METABOLIC PANEL - Abnormal; Notable for the following:    BUN 21 (*)    Creatinine, Ser 1.27 (*)    All other components within normal limits  CBC WITH DIFFERENTIAL/PLATELET - Abnormal; Notable for the following:    WBC 12.3 (*)    MCHC 36.3 (*)    Neutro Abs 9.5 (*)    All other components within normal limits    EKG  EKG Interpretation None       Radiology Dg Abdomen 1 View  Result Date: 02/20/2017 CLINICAL DATA:  Pt c/o left sided kidney stone. H/o previous kidney stones with multiple cystoscopy/stent placements. EXAM: ABDOMEN - 1 VIEW COMPARISON:  12/26/2016 FINDINGS: There are numerous calcifications overlying the expected location of the lower pole the left kidney. Largest measures approximately 7 mm. To the left of the L4 vertebral body, there is a rounded opacity, possibly representing left ureteral stone or stones. This density measures 6 mm. Bowel gas pattern is nonobstructive. There is scoliosis of the lumbar spine. IMPRESSION: 1. Left intrarenal calcifications. 2. Possible left ureteral stones overlying the left transverse process at L4.  Electronically Signed   By: Norva Pavlov M.D.   On: 02/20/2017 14:26   US Renal  Result Date: 02/20/2017 CLINICAL DATA:  Kidney stone.  Left flank pain last night. EXAM: RENAL / URINARY TRACT ULTRASOUND COMPLETE COMPARISON:  None. FINDINGS: Right Kidney: Length: 12.1 cm. 5 mm linear echogenic focus within the right renal pelvis, likely small stone. No associated hydronephrosis. Left Kidney: Length: 13.1 cm. Moderate hydronephrosis. Probable 7 mm left renal stone appear Bladder: Appears normal for degree of bladder distention. Distal right ureter is shown to be patent at the level of the bladder (right ureteral jet visualized). No left ureteral jet visualized. IMPRESSION: 1. Moderate left-sided hydronephrosis. Suspect obstructing stone within the left ureter. Consider CT for confirmation. 2. Bilateral nephrolithiasis. Electronically Signed   By: Bary Richard M.D.   On: 02/20/2017 15:32  Procedures Procedures (including critical care time)  Medications Ordered in ED Medications  ketorolac (TORADOL) 30 MG/ML injection 15 mg (not administered)  HYDROmorphone (DILAUDID) injection 0.5 mg (not administered)  ondansetron (ZOFRAN-ODT) disintegrating tablet 4 mg (4 mg Oral Given 02/20/17 1052)  HYDROmorphone (DILAUDID) injection 0.5 mg (0.5 mg Intravenous Given 02/20/17 1341)  ondansetron (ZOFRAN) injection 4 mg (4 mg Intravenous Given 02/20/17 1340)  HYDROmorphone (DILAUDID) injection 1 mg (1 mg Intravenous Given 02/20/17 1507)     Initial Impression / Assessment and Plan / ED Course  I have reviewed the triage vital signs and the nursing notes.  Pertinent labs & imaging results that were available during my care of the patient were reviewed by me and considered in my medical decision making (see chart for details).     Patient presents with left flank pain and history of kidney stones. UA shows moderate amount of hemoglobin. Patient does have CVA tenderness on the right. Abdominal exam is benign.  Patient requesting that we do not perform a CT scan as he knows he has a kidney stone. KUB was performed that showed several stones likeliness left ureter. Renal ultrasound was performed that showed moderate left-sided hydronephrosis likely blockage. Pain has been controlled with medication. Patient's creatinine is slightly elevated at 1.27. He is afebrile. Mild leukocytosis of 12. Spoke with Dr. Berneice HeinrichMAnny with [facility] who recommends Toradol and follow-up in the office tomorrow morning. UA shows no signs of infection. Patient does not have intractable vomiting. Able tolerate by mouth fluids. Feels comfortable for discharge. He is nontoxic appearing. We'll discharge the pain medicine, nausea medicine, Flomax. Pt is hemodynamically stable, in NAD, & able to ambulate in the ED. Pain has been managed & has no complaints prior to dc. Pt is comfortable with above plan and is stable for discharge at this time. All questions were answered prior to disposition. Strict return precautions for f/u to the ED were discussed.   Final Clinical Impressions(s) / ED Diagnoses   Final diagnoses:  Ureterolithiasis  Flank pain    New Prescriptions New Prescriptions   OXYCODONE-ACETAMINOPHEN (PERCOCET/ROXICET) 5-325 MG TABLET    Take 1-2 tablets by mouth every 6 (six) hours as needed for severe pain.   TAMSULOSIN (FLOMAX) 0.4 MG CAPS CAPSULE    Take 1 capsule (0.4 mg total) by mouth daily.     Rise MuKenneth T Leaphart, PA-C 02/20/17 1716    Nira ConnPedro Eduardo Cardama, MD 02/20/17 [phone]

## 2017-02-20 NOTE — Discharge Instructions (Signed)
You need to call the morning to have your appointment to see your urologist. They are expecting you to call. Please take the pain medicine and nausea medicine as needed. Continue taking Flomax. Return to the ED if your unable to control her vomiting or pain.

## 2017-02-20 NOTE — ED Notes (Signed)
PT DISCHARGED. INSTRUCTIONS AND PRESCRIPTIONS GIVEN. AAOX4. PT IN NO APPARENT DISTRESS. THE OPPORTUNITY TO ASK QUESTIONS WAS PROVIDED. 

## 2017-02-20 NOTE — ED Notes (Signed)
Ultrasound at bedside

## 2017-02-20 NOTE — ED Triage Notes (Signed)
Pt here with left flank pain.  Started at 3 am.  Hx of kidney stones.  Feels the same.  Nausea with vomiting.  No fever.

## 2017-02-21 ENCOUNTER — Encounter (HOSPITAL_COMMUNITY)
Admission: RE | Disposition: A | Discharge status: Home / Self Care | Payer: Self-pay | Source: Ambulatory Visit | Attending: Urology

## 2017-02-21 ENCOUNTER — Other Ambulatory Visit: Payer: Self-pay | Admitting: Urology

## 2017-02-21 ENCOUNTER — Ambulatory Visit (HOSPITAL_COMMUNITY): Payer: BLUE CROSS/BLUE SHIELD | Admitting: Anesthesiology

## 2017-02-21 ENCOUNTER — Ambulatory Visit (HOSPITAL_COMMUNITY)
Admission: RE | Admit: 2017-02-21 | Discharge: 2017-02-21 | Disposition: A | Discharge status: Home / Self Care | Payer: BLUE CROSS/BLUE SHIELD | Source: Ambulatory Visit | Attending: Urology | Admitting: Urology

## 2017-02-21 ENCOUNTER — Encounter (HOSPITAL_COMMUNITY): Payer: Self-pay

## 2017-02-21 DIAGNOSIS — Z87442 Personal history of urinary calculi: Secondary | ICD-10-CM | POA: Diagnosis not present

## 2017-02-21 DIAGNOSIS — Z79899 Other long term (current) drug therapy: Secondary | ICD-10-CM | POA: Insufficient documentation

## 2017-02-21 DIAGNOSIS — N202 Calculus of kidney with calculus of ureter: Secondary | ICD-10-CM | POA: Insufficient documentation

## 2017-02-21 DIAGNOSIS — N132 Hydronephrosis with renal and ureteral calculous obstruction: Secondary | ICD-10-CM | POA: Diagnosis not present

## 2017-02-21 DIAGNOSIS — Z466 Encounter for fitting and adjustment of urinary device: Secondary | ICD-10-CM | POA: Diagnosis not present

## 2017-02-21 HISTORY — PX: CYSTOSCOPY W/ URETERAL STENT PLACEMENT: SHX1429

## 2017-02-21 HISTORY — PX: HOLMIUM LASER APPLICATION: SHX5852

## 2017-02-21 SURGERY — CYSTOSCOPY, WITH RETROGRADE PYELOGRAM AND URETERAL STENT INSERTION
Anesthesia: General | Laterality: Left

## 2017-02-21 MED ORDER — PROMETHAZINE HCL 25 MG/ML IJ SOLN: 6.2500 mg | INTRAMUSCULAR | Status: DC | PRN

## 2017-02-21 MED ORDER — MIDAZOLAM HCL 5 MG/5ML IJ SOLN
INTRAMUSCULAR | Status: DC | PRN
  Administered 2017-02-21: 2 mg via INTRAVENOUS

## 2017-02-21 MED ORDER — LIDOCAINE 2% (20 MG/ML) 5 ML SYRINGE
INTRAMUSCULAR | Status: DC | PRN
  Administered 2017-02-21: 80 mg via INTRAVENOUS

## 2017-02-21 MED ORDER — FENTANYL CITRATE (PF) 100 MCG/2ML IJ SOLN: 25.0000 ug | INTRAMUSCULAR | Status: DC | PRN

## 2017-02-21 MED ORDER — FENTANYL CITRATE (PF) 100 MCG/2ML IJ SOLN
INTRAMUSCULAR | Status: AC
  Filled 2017-02-21: qty 2

## 2017-02-21 MED ORDER — FENTANYL CITRATE (PF) 100 MCG/2ML IJ SOLN
INTRAMUSCULAR | Status: DC | PRN
  Administered 2017-02-21: 25 ug via INTRAVENOUS
  Administered 2017-02-21 (×2): 50 ug via INTRAVENOUS

## 2017-02-21 MED ORDER — LIDOCAINE HCL 2 % EX GEL
CUTANEOUS | Status: DC | PRN
  Administered 2017-02-21: 1 via URETHRAL

## 2017-02-21 MED ORDER — IOHEXOL 300 MG/ML  SOLN
INTRAMUSCULAR | Status: DC | PRN
  Administered 2017-02-21: 20 mL via URETHRAL

## 2017-02-21 MED ORDER — PROPOFOL 10 MG/ML IV BOLUS
INTRAVENOUS | Status: DC | PRN
  Administered 2017-02-21: 200 mg via INTRAVENOUS

## 2017-02-21 MED ORDER — CEFAZOLIN SODIUM-DEXTROSE 2-4 GM/100ML-% IV SOLN
2.0000 g | INTRAVENOUS | Status: AC
  Administered 2017-02-21: 2 g via INTRAVENOUS
  Filled 2017-02-21: qty 100

## 2017-02-21 MED ORDER — LIDOCAINE HCL 2 % EX GEL
CUTANEOUS | Status: AC
  Filled 2017-02-21: qty 5

## 2017-02-21 MED ORDER — PROPOFOL 10 MG/ML IV BOLUS
INTRAVENOUS | Status: AC
  Filled 2017-02-21: qty 20

## 2017-02-21 MED ORDER — DEXAMETHASONE SODIUM PHOSPHATE 10 MG/ML IJ SOLN
INTRAMUSCULAR | Status: AC
  Filled 2017-02-21: qty 1

## 2017-02-21 MED ORDER — KETOROLAC TROMETHAMINE 30 MG/ML IJ SOLN: 30.0000 mg | Freq: Once | INTRAMUSCULAR | Status: DC | PRN

## 2017-02-21 MED ORDER — LACTATED RINGERS IV SOLN
INTRAVENOUS | Status: DC
  Administered 2017-02-21: 20:00:00 via INTRAVENOUS
  Administered 2017-02-21: 1000 mL via INTRAVENOUS

## 2017-02-21 MED ORDER — SODIUM CHLORIDE 0.9 % IR SOLN
Status: DC | PRN
  Administered 2017-02-21: 4000 mL via INTRAVESICAL

## 2017-02-21 MED ORDER — FENTANYL CITRATE (PF) 100 MCG/2ML IJ SOLN
50.0000 ug | INTRAMUSCULAR | Status: AC | PRN
  Administered 2017-02-21 (×2): 50 ug via INTRAVENOUS
  Filled 2017-02-21: qty 2

## 2017-02-21 MED ORDER — DEXAMETHASONE SODIUM PHOSPHATE 10 MG/ML IJ SOLN
INTRAMUSCULAR | Status: DC | PRN
  Administered 2017-02-21: 10 mg via INTRAVENOUS

## 2017-02-21 MED ORDER — SUCCINYLCHOLINE CHLORIDE 200 MG/10ML IV SOSY
PREFILLED_SYRINGE | INTRAVENOUS | Status: DC | PRN
  Administered 2017-02-21: 100 mg via INTRAVENOUS

## 2017-02-21 MED ORDER — ONDANSETRON HCL 4 MG/2ML IJ SOLN
INTRAMUSCULAR | Status: DC | PRN
  Administered 2017-02-21: 4 mg via INTRAVENOUS

## 2017-02-21 MED ORDER — ONDANSETRON HCL 4 MG/2ML IJ SOLN
INTRAMUSCULAR | Status: AC
  Filled 2017-02-21: qty 2

## 2017-02-21 MED ORDER — MIDAZOLAM HCL 2 MG/2ML IJ SOLN
INTRAMUSCULAR | Status: AC
  Filled 2017-02-21: qty 2

## 2017-02-21 SURGICAL SUPPLY — 14 items
BAG URO CATCHER STRL LF (MISCELLANEOUS) ×2 IMPLANT
CATH URET 5FR 28IN OPEN ENDED (CATHETERS) ×2 IMPLANT
CLOTH BEACON ORANGE TIMEOUT ST (SAFETY) ×2 IMPLANT
EXTRACTOR STONE NITINOL NGAGE (UROLOGICAL SUPPLIES) ×2 IMPLANT
FIBER LASER TRAC TIP (UROLOGICAL SUPPLIES) ×2 IMPLANT
GLOVE SURG SS PI 8.0 STRL IVOR (GLOVE) ×6 IMPLANT
GOWN STRL REUS W/TWL XL LVL3 (GOWN DISPOSABLE) ×2 IMPLANT
GUIDEWIRE STR DUAL SENSOR (WIRE) ×2 IMPLANT
MANIFOLD NEPTUNE II (INSTRUMENTS) ×2 IMPLANT
PACK CYSTO (CUSTOM PROCEDURE TRAY) ×2 IMPLANT
SHEATH ACCESS URETERAL 38CM (SHEATH) ×2 IMPLANT
SHEATH ACCESS URETERAL 54CM (SHEATH) ×2 IMPLANT
STENT CONTOUR 6FRX26X.038 (STENTS) ×2 IMPLANT
TUBING CONNECTING 10 (TUBING) ×2 IMPLANT

## 2017-02-21 NOTE — Anesthesia Preprocedure Evaluation (Signed)
Anesthesia Evaluation  Patient identified by MRN, date of birth, ID band Patient awake    Reviewed: Allergy & Precautions, NPO status , Patient's Chart, lab work & pertinent test results  Airway Mallampati: II  TM Distance: >3 FB Neck ROM: Full    Dental no notable dental hx.    Pulmonary neg pulmonary ROS, former smoker,    Pulmonary exam normal breath sounds clear to auscultation       Cardiovascular negative cardio ROS Normal cardiovascular exam Rhythm:Regular Rate:Normal     Neuro/Psych negative neurological ROS  negative psych ROS   GI/Hepatic negative GI ROS, Neg liver ROS,   Endo/Other  negative endocrine ROS  Renal/GU negative Renal ROS  negative genitourinary   Musculoskeletal negative musculoskeletal ROS (+)   Abdominal   Peds negative pediatric ROS (+)  Hematology negative hematology ROS (+)   Anesthesia Other Findings   Reproductive/Obstetrics negative OB ROS                             Anesthesia Physical  Anesthesia Plan  ASA: II  Anesthesia Plan: General   Post-op Pain Management:    Induction: Intravenous  Airway Management Planned: LMA  Additional Equipment:   Intra-op Plan:   Post-operative Plan:   Informed Consent: I have reviewed the patients History and Physical, chart, labs and discussed the procedure including the risks, benefits and alternatives for the proposed anesthesia with the patient or authorized representative who has indicated his/her understanding and acceptance.   Dental advisory given  Plan Discussed with: CRNA and Surgeon  Anesthesia Plan Comments:         Anesthesia Quick Evaluation  

## 2017-02-21 NOTE — H&P (Signed)
CC: I have kidney stones.  HPI: Thomas Howell is a 26 year-old male established patient who is here for renal calculi.  The problem is on the left side. He first stated noticing pain on approximately 02/20/2017. He is currently having flank pain, nausea, and vomiting. He denies having back pain, groin pain, fever, and chills.   He has had eswl and ureteroscopy for treatment of his stones in the past.   Thomas Howell had the onset yesterday at 3am of left flank pain. The pain is severe with N/V. He has had no hematuria or voiding complaints. He was in the ER yesterday but he has been unable to keep anything down since then. He had a KUB that showed a possible 6 mm proximal stone on the left. He has had to have ureteroscopy because his stones don't fragment with ESWL. He has had no fever.      ALLERGIES: Adhesive tape Codeine Derivatives Hycodan SYRP    MEDICATIONS: Percocet  Flomax  Keppra 1,000 mg tablet Oral  Maxalt  Zofran     GU PSH: Cysto Uretero Lithotripsy - 08/19/2015, 07/17/2015, 2015 Cystoscopy Insert Stent - 01/19/2016, 08/20/2015, 08/19/2015, 07/17/2015, 2015, 2015 Cystoscopy Ureteroscopy - 01/19/2016, 08/20/2015 ESWL - 07/08/2015, 2016 Remove Pelvic Lymph Nodes - 2012 Ureteroscopic laser litho, Left - 07/30/2016, 02/18/2016      PSH Notes: Cystoscopy Ureteroscopy Lithotripsy Incl Insert Indwelling Ureter Stent, Cystoscopy With Insertion Of Ureteral Stent Left, Cystoscopy With Ureteroscopy Left, Cystoscopy With Ureteroscopy Left, Cystoscopy With Insertion Of Ureteral Stent Left, Cystoscopy With Insertion Of Ureteral Stent Left, Cystoscopy With Ureteroscopy With Lithotripsy, Cystoscopy With Ureteroscopy With Lithotripsy, Cystoscopy With Insertion Of Ureteral Stent Left, Lithotripsy, Lithotripsy, Cystoscopy With Ureteroscopy With Lithotripsy, Cystoscopy With Insertion Of Ureteral Stent Left, Cystoscopy With Insertion Of Ureteral Stent Left, Lymphadenectomy, Foot Surgery   NON-GU PSH: None    GU PMH: Kidney Stone - 09/24/2016, - 07/29/2016, Kidney stone on left side, - 02/27/2016, Nephrolithiasis, - 02/06/2016 Calculus Ureter - 07/29/2016, Calculus of left ureter, - 01/16/2016 Hydronephrosis Unspec, Hydronephrosis, left - 02/06/2016 Other microscopic hematuria, Microscopic hematuria - 02/06/2016 Gross hematuria, Gross hematuria - 12/24/2015    NON-GU PMH: Personal history of other diseases of the nervous system and sense organs, History of migraine headaches - 02/06/2016 Encounter for general adult medical examination without abnormal findings, Encounter for preventive health examination - 12/24/2015    FAMILY HISTORY: Blood In Urine - Mother, Brother Family Health Status Number - Runs In Family nephrolithiasis - Runs In Family   SOCIAL HISTORY: Marital Status: Single Current Smoking Status: Patient has never smoked.  Has never drank.  Drinks 1 caffeinated drink per day. Patient's occupation Insurance underwriter.     Notes: Current smoker on some days, Alcohol Use, Marital History - Single, Tobacco Use, Caffeine Use, Occupation:   REVIEW OF SYSTEMS:    GU Review Male:   Patient denies frequent urination, hard to postpone urination, burning/ pain with urination, get up at night to urinate, leakage of urine, stream starts and stops, trouble starting your stream, have to strain to urinate , erection problems, and penile pain.  Gastrointestinal (Upper):   Patient reports nausea and vomiting. Patient denies indigestion/ heartburn.  Gastrointestinal (Lower):   Patient denies diarrhea and constipation.  Constitutional:   Patient reports fatigue. Patient denies fever, night sweats, and weight loss.  Skin:   Patient denies skin rash/ lesion and itching.  Eyes:   Patient denies blurred vision and double vision.  Ears/ Nose/ Throat:   Patient denies sore  throat and sinus problems.  Hematologic/Lymphatic:   Patient denies swollen glands and easy bruising.  Cardiovascular:   Patient denies leg  swelling and chest pains.  Respiratory:   Patient denies cough and shortness of breath.  Endocrine:   Patient denies excessive thirst.  Musculoskeletal:   Patient denies back pain and joint pain.  Neurological:   Patient denies headaches and dizziness.  Psychologic:   Patient denies anxiety and depression.   VITAL SIGNS:      02/21/2017 10:35 AM  Weight 220 lb / 99.79 kg  Height 68 in / 172.72 cm  BP 134/79 mmHg  Pulse 64 /min  Temperature 98.2 F / 37 C  BMI 33.4 kg/m   PAST DATA REVIEWED:  Source Of History:  Patient  Urine Test Review:   Urinalysis  X-Ray Review: KUB: Reviewed Films. Reviewed Report. Discussed With Patient.  Renal Ultrasound: Reviewed Films. Reviewed Report. Discussed With Patient.     PROCEDURES:          Urinalysis w/Scope Dipstick Dipstick Cont'd Micro  Color: Amber Bilirubin: Neg WBC/hpf: 0 - 5/hpf  Appearance: Clear Ketones: Trace RBC/hpf: 3 - 10/hpf  Specific Gravity: 1.030 Blood: Neg Bacteria: Rare (0-9/hpf)  pH: 6.0 Protein: 1+ Cystals: NS (Not Seen)  Glucose: Neg Urobilinogen: 0.2 Casts: NS (Not Seen)    Nitrites: Neg Trichomonas: Not Present    Leukocyte Esterase: Neg Mucous: Not Present      Epithelial Cells: 0 - 5/hpf      Yeast: NS (Not Seen)      Sperm: Not Present         Ketoralac 60mg  - [id number], [id number] Qty: 60 Adm. By: Tawnya CrookErica Wheless  Unit: mg Lot No [id number]  Route: IM Exp. Date 08/20/2018  Freq: None Mfgr.:   Site: Left Hip         Phenergan 25mg  - [id number], [id number] Qty: 25 Adm. By: Tawnya CrookErica Wheless  Unit: mg Lot No [id number]  Route: IM Exp. Date 02/16/2019  Freq: None Mfgr.:   Site: Right Hip   ASSESSMENT:      ICD-10 Details  1 GU:   Calculus Ureter - N20.1 6mm Left proximal stone with pain and nausea. Left renal stones. I will give him toradol and phenergan and get him set up for Ureteroscopy and stent. he is aware of the risks.   2   Kidney Stone - N20.0      PLAN:           Schedule Return Visit/Planned Activity: ASAP -  Schedule Surgery  [facility]: [facility] - [phone] - Ketoralac 60mg  (Toradol Per 15 Mg) - [id number], [phone]  Procedure: 02/21/2017 [facility] - [phone] - Phenergan 25mg  (Phenergan Per 50 Mg) - [id number], [id number]          Document Letter(s):  Created for Patient: Clinical Summary

## 2017-02-21 NOTE — Discharge Instructions (Signed)
Ureteral Stent Implantation, Care After °Refer to this sheet in the next few weeks. These instructions provide you with information about caring for yourself after your procedure. Your health care provider may also give you more specific instructions. Your treatment has been planned according to current medical practices, but problems sometimes occur. Call your health care provider if you have any problems or questions after your procedure. °What can I expect after the procedure? °After the procedure, it is common to have: °· Nausea. °· Mild pain when you urinate. You may feel this pain in your lower back or lower abdomen. Pain should stop within a few minutes after you urinate. This may last for up to 1 week. °· A small amount of blood in your urine for several days. °Follow these instructions at home: °  °Medicines  °· Take over-the-counter and prescription medicines only as told by your health care provider. °· If you were prescribed an antibiotic medicine, take it as told by your health care provider. Do not stop taking the antibiotic even if you start to feel better. °· Do not drive for 24 hours if you received a sedative. °· Do not drive or operate heavy machinery while taking prescription pain medicines. °Activity  °· Return to your normal activities as told by your health care provider. Ask your health care provider what activities are safe for you. °· Do not lift anything that is heavier than 10 lb (4.5 kg). Follow this limit for 1 week after your procedure, or for as long as told by your health care provider. °General instructions  °· Watch for any blood in your urine. Call your health care provider if the amount of blood in your urine increases. °· If you have a catheter: °¨ Follow instructions from your health care provider about taking care of your catheter and collection bag. °¨ Do not take baths, swim, or use a hot tub until your health care provider approves. °· Drink enough fluid to keep your urine  clear or pale yellow. °· Keep all follow-up visits as told by your health care provider. This is important. °Contact a health care provider if: °· You have pain that gets worse or does not get better with medicine, especially pain when you urinate. °· You have difficulty urinating. °· You feel nauseous or you vomit repeatedly during a period of more than 2 days after the procedure. °Get help right away if: °· Your urine is dark red or has blood clots in it. °· You are leaking urine (have incontinence). °· The end of the stent comes out of your urethra. °· You cannot urinate. °· You have sudden, sharp, or severe pain in your abdomen or lower back. °· You have a fever. °This information is not intended to replace advice given to you by your health care provider. Make sure you discuss any questions you have with your health care provider. °Document Released: 08/08/2013 Document Revised: 05/13/2016 Document Reviewed: 06/20/2015 °Elsevier Interactive Patient Education © 2017 [employer] ° °

## 2017-02-21 NOTE — Op Note (Signed)
Preoperative Diagnosis: Left obstructing proximal ureteral calculus, left intrarenal calculi  Postoperative Diagnosis:  Same  Procedure(s) Performed:  1. Cystourethroscopy 2. Left ureteroscopic stone extraction with laser lithotripsy 3. Left retrograde pyelogram 4. Left ureteral stent placement, 576fr x 26cm JJ ureteral stent without dangler 5. Intraoperative fluoroscopy with interpretation <1hr.  Teaching Surgeon:  Bjorn PippinJohn Izen Petz, MD  Resident Surgeon:  Jalene MulletMatthew Macey, MD  Assistant(s):  None  Anesthesia:  General  Fluids:  See anesthesia record  Estimated blood loss:  5 mL  Specimens:  Left ureteral and renal calculi for stone analysis  Cultures:  None  Drains:  876fr x 26cm JJ ureteral stent without dangler  Complications:  None  Indications: 26yo male seen earlier today with left proximal obstructing ureteral calculus with pain & nausea with further left renal stones noted. Previously seen in ER yesterday. Please refer to previous documentation for more information. He presents today for left ureteral stent placement and possible uretoscopic stone extraction.  Findings:  Proximal left obstructing ureteral calculus. Questionable left mid ureteral stricture. Several left intrarenal calculi in lower pole calyces. 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 Stone consistents with calcium oxalate dihydrate composition. Successful stone treatment and placement of ureteral stent.   Radiologic Interpretation of Retrograde Pyelogram: Left retrograde pyelogram showed no contrast extravasation, what appeared to be a stricture in the left mid ureter, and a filling defect in the left proximal ureter.  There was no hydronephrosis appreciated.  Description:  The patient was correctly identified in the preop holding area where written informed consent as well potential risk and complication reviewed. He agreed. They were brought back to the operative suite. Once correct  information was verified, general anesthesia was induced. They were then gently placed into dorsal lithotomy position with SCDs in place for VTE prophylaxis. They were prepped and draped in the usual sterile fashion and given appropriate preoperative antibiotics with Ancef. A second timeout was then performed.   We inserted a 25F rigid cystoscope per urethra with copious lubrication and normal saline irrigation running. This demonstrated a normal urethra. The bladder was normal on cystoscopy.  There were no intravesical tumors, foreign bodies, stones or mucosal irregularities noted.  We turned our attention to the left ureteral orifice which was cannulated with a 5 [place] open-ended catheter.  A retrograde pyelogram was performed with imaging as noted above.  The sensor wire was advanced into the left kidney with an appropriate curl noted and the 25 [place] open-ended catheter was advanced to the mid ureter.  There was an area of relative tightness in the mid ureter although the 5 [place] open-ended catheter was able to pass through this.  We removed the 5 [place] open-ended catheter and the rigid cystoscope leaving the sensor wire in place a 12/14 [place] ureteral access sheath was then advanced over the sensor wire into the mid ureter under fluoroscopic guidance.  The inner cannula of the ureteral access sheath was removed and flexible ureteroscopy was performed.  This revealed a large jagged bright yellow calculus in the proximal to mid left ureter.  A 200  holmium laser fiber was used to perform laser lithotripsy of the stone which was fragmented into small pieces.  The stone unfortunately relocated back into the kidney and fell into a lower pole calyx.  Flexible pyeloscopy showed multiple stone burden in the lower pole calyces of the left kidney.  All stone fragments were fragmented down to a size where they could be removed with the basket.  We eventually transitioned to the longest ureteral access sheath size  in order to prevent basketing through any possible ureteral strictures. There was moderate resistance noted in the mid left ureter on passage of our ureteral access sheath to the proximal ureter on the left, although it did accommodate a 12/14 sheath. A significant amount of stone burden was dusted in the lower pole calyces.  All other significantly sized stone fragments were removed with the basket.  At the conclusion of our procedure there were no stone fragments greater than size of the laser fiber tip in diameter.  The flexible ureteroscope as well as the ureteral access sheath were removed after advancing a sensor wire into the left kidney under fluoroscopic guidance.  The sensor wire was back loaded into the rigid cystoscope using a stent pusher.  We then advanced a 6 [place] by 26 cm double-J ureteral stent over the sensor wire under direct visual and fluoroscopic guidance without difficulty.  Since aware removal demonstrated satisfactory stent curled proximally within the renal pelvis as well as distally within the bladder.  The bladder was emptied and all instrumentation was removed.  Viscous lidocaine Urojet jelly was instilled in the urethra.  The patient was woken up from anesthesia and taken to recovery unit for routine postoperative care.   Post Op Plan:   1. Discharge patient home when meets PACU criteria and voids x 1. 2. D/c with Rx's for oxycodone, colace, flomax PRN, ditropan PRN, pyridium & phenergan PRN 3. RTC in 1 week with KUB and local cysto, stent remove  Attestation:  Dr. Annabell Howells was present and scrubbed for the entirety of the procedure.    Jalene Mullet, MD Resident, Department of Urology

## 2017-02-21 NOTE — Anesthesia Procedure Notes (Signed)
Procedure Name: Intubation Date/Time: 02/21/2017 7:15 PM Performed by: West Pugh Pre-anesthesia Checklist: Patient identified, Emergency Drugs available, Suction available, Patient being monitored and Timeout performed Patient Re-evaluated:Patient Re-evaluated prior to inductionOxygen Delivery Method: Circle system utilized Preoxygenation: Pre-oxygenation with 100% oxygen Intubation Type: IV induction, Rapid sequence and Cricoid Pressure applied Laryngoscope Size: Mac and 4 Grade View: Grade I Tube type: Oral Tube size: 7.5 mm Number of attempts: 1 Airway Equipment and Method: Stylet Placement Confirmation: ETT inserted through vocal cords under direct vision,  positive ETCO2,  CO2 detector and breath sounds checked- equal and bilateral Secured at: 22 cm Tube secured with: Tape Dental Injury: Teeth and Oropharynx as per pre-operative assessment

## 2017-02-21 NOTE — Transfer of Care (Signed)
Immediate Anesthesia Transfer of Care Note  Patient: Thomas Howell  Procedure(s) Performed: Procedure(s): CYSTOSCOPY WITH RETROGRADE PYELOGRAM/URETERAL LEFT STENT PLACEMENT WITH  LASER (Left) HOLMIUM LASER APPLICATION (Left)  Patient Location: PACU  Anesthesia Type:General  Level of Consciousness:  sedated, patient cooperative and responds to stimulation  Airway & Oxygen Therapy:Patient Spontanous Breathing and Patient connected to face mask oxgen  Post-op Assessment:  Report given to PACU RN and Post -op Vital signs reviewed and stable  Post vital signs:  Reviewed and stable  Last Vitals:  Vitals:   02/21/17 1505 02/21/17 2051  BP: 125/76 134/76  Pulse: 74 84  Resp: 16   Temp: 36.9 C 36.5 C    Complications: No apparent anesthesia complications

## 2017-02-21 NOTE — Interval H&P Note (Signed)
History and Physical Interval Note:  He has gotten relief from the Toradol and phenergan.  02/21/2017 6:38 PM  Sheliah Mendsussell K Vanhandel  has presented today for surgery, with the diagnosis of left proximal stone  The various methods of treatment have been discussed with the patient and family. After consideration of risks, benefits and other options for treatment, the patient has consented to  Procedure(s): CYSTOSCOPY WITH RETROGRADE PYELOGRAM/URETERAL LEFT STENT PLACEMENT WITH POSSIBLE LASER (Left) as a surgical intervention .  The patient's history has been reviewed, patient examined, no change in status, stable for surgery.  I have reviewed the patient's chart and labs.  Questions were answered to the patient's satisfaction.     Cheryal Salas J

## 2017-02-21 NOTE — Progress Notes (Signed)
PACU NURSING NOTE: Discharge note; Pt fully awake, alert and oriented, able to ambulate w/o assistance, tolerated PO fluids, denies any pain or discomfort. Able to void w/o difficulty. DC instructions and Rx meds reviewed with pt and pts brother. Discussed safety while taking PO pain meds, also discussed Post General Anesthesia Care, I.e. Diet, rest, encouraging PO fluids, cough and deep breathing. Also reviewed times to call MD such as for unrelieved pain, unable to void, frank blood urine and signs of infection. Pt escorted to POV with family.

## 2017-02-22 ENCOUNTER — Encounter (HOSPITAL_COMMUNITY): Payer: Self-pay | Admitting: Urology

## 2017-02-22 NOTE — Anesthesia Postprocedure Evaluation (Addendum)
Anesthesia Post Note  Patient: Sheliah MendsRussell K Basu  Procedure(s) Performed: Procedure(s) (LRB): CYSTOSCOPY WITH RETROGRADE PYELOGRAM/URETERAL LEFT STENT PLACEMENT WITH  LASER (Left) HOLMIUM LASER APPLICATION (Left)  Patient location during evaluation: PACU Anesthesia Type: General Level of consciousness: awake and alert Pain management: pain level controlled Vital Signs Assessment: post-procedure vital signs reviewed and stable Respiratory status: spontaneous breathing, nonlabored ventilation, respiratory function stable and patient connected to nasal cannula oxygen Cardiovascular status: blood pressure returned to baseline and stable Postop Assessment: no signs of nausea or vomiting Anesthetic complications: no       Last Vitals:  Vitals:   02/21/17 2051 02/21/17 2115  BP: 134/76   Pulse: 84   Resp: 14   Temp: 36.5 C 36.6 C    Last Pain:  Vitals:   02/21/17 1745  TempSrc:   PainSc: 4                  Maeson Lourenco S

## 2017-03-01 DIAGNOSIS — N200 Calculus of kidney: Secondary | ICD-10-CM | POA: Diagnosis not present

## 2017-03-09 DIAGNOSIS — G43709 Chronic migraine without aura, not intractable, without status migrainosus: Secondary | ICD-10-CM | POA: Diagnosis not present

## 2017-03-10 ENCOUNTER — Ambulatory Visit (INDEPENDENT_AMBULATORY_CARE_PROVIDER_SITE_OTHER): Payer: BLUE CROSS/BLUE SHIELD | Admitting: Neurology

## 2017-03-10 ENCOUNTER — Encounter: Payer: Self-pay | Admitting: Neurology

## 2017-03-10 VITALS — BP 133/84 | HR 76 | Resp 16 | Ht 68.0 in | Wt 223.5 lb

## 2017-03-10 DIAGNOSIS — G43009 Migraine without aura, not intractable, without status migrainosus: Secondary | ICD-10-CM | POA: Diagnosis not present

## 2017-03-10 DIAGNOSIS — M542 Cervicalgia: Secondary | ICD-10-CM

## 2017-03-10 DIAGNOSIS — N200 Calculus of kidney: Secondary | ICD-10-CM | POA: Diagnosis not present

## 2017-03-10 DIAGNOSIS — G43709 Chronic migraine without aura, not intractable, without status migrainosus: Secondary | ICD-10-CM | POA: Diagnosis not present

## 2017-03-10 DIAGNOSIS — IMO0002 Reserved for concepts with insufficient information to code with codable children: Secondary | ICD-10-CM

## 2017-03-10 NOTE — Progress Notes (Signed)
[facility]  PATIENT: Thomas Howell DOB: 07-31-1991  REFERRING DOCTOR OR PCP:  Ardyth Gal SOURCE: patient, records in EMR  _________________________________   HISTORICAL  CHIEF COMPLAINT:  Chief Complaint  Patient presents with  . Headaches    Here today for Botox inj. to treat migraines/fim    HISTORY OF PRESENT ILLNESS:  Thomas Howell  is a 26 yo man with chronic migraines since age 38.  Headaches have persisted despite multiple prophylactic regimens.     Currently he is on Lamictal and Keppra and is experiencing about 18 headaches a month (20-24/30 a month) lasting > 4 hours each day.   He also has milder occipital headache pain every day (30/30 a month).     A typical headache starts in the occiput and then radiates to the eye (usually on the right) with a pounding quality.      He has no preceding aura.   He gets nausea and vomiting.   Moving increases the headache and laying in bed in a dark quiet room helps.    He has associated phonophobia and photophobia. Most headaches are random but some are triggered by strong smells such as perfume.     Maxalt helps some of the migraine headaches.  Often he has to take 2 over the day to get rid of the headache .   He uses about 10/month often in combination with Aleve.   Phenergan helps nausea    If he falls asleep, pain is oftebetter upon awakening.     He also reports neck pain. The neck pain is predominantly occipital. It worsens when the headache  HA treatment History:  Topiramate had some benefit but cause kidney stones and he needed to stop. Amitriptyline caused grogginess the next day.   Nortriptyline had not helped.    Keppra and lamotriine did not help the headache frequency much.  Aleve and other NSAID's by themself have not helped.  He sleeps well most nights.     REVIEW OF SYSTEMS: Constitutional: No fevers, chills, sweats, or change in appetite Eyes: No visual changes, double vision, eye  pain Ear, nose and throat: No hearing loss, ear pain, nasal congestion, sore throat Cardiovascular: No chest pain, palpitations Respiratory: No shortness of breath at rest or with exertion.   No wheezes GastrointestinaI: No nausea, vomiting, diarrhea, abdominal pain, fecal incontinence Genitourinary: No dysuria, urinary retention or frequency.  He has frequent kidney stones (sees Dr. Mena Goes at [facility]) Musculoskeletal: No neck pain, back pain Integumentary: No rash, pruritus, skin lesions Neurological: as above Psychiatric: No depression at this time.  No anxiety Endocrine: No palpitations, diaphoresis, change in appetite, change in weigh or increased thirst Hematologic/Lymphatic: No anemia, purpura, petechiae. Allergic/Immunologic: No itchy/runny eyes, nasal congestion, recent allergic reactions, rashes  ALLERGIES: Allergies  Allergen Reactions  . Codeine Swelling    Lips swell//  This includes cough syrup w/ codeine  . Adhesive [Tape] Rash    HOME MEDICATIONS:  Current Outpatient Prescriptions:  .  lamoTRIgine (LAMICTAL) 100 MG tablet, Take 1 tablet (100 mg total) by mouth daily. Take 1/2 po qd x 5 days, then 1/2 po bid x 5 d, then 1/2 po tid x 5d then 1 po bid (Patient taking differently: Take 100 mg by mouth 2 (two) times daily. ), Disp: 60 tablet, Rfl: 11 .  levETIRAcetam (KEPPRA) 750 MG tablet, Take one po qAM and 2 po q HS (Patient taking differently: Take 750-1,500 mg by mouth 2 (two) times  daily. 750mg  in the morning and 1500mg  at bedtime), Disp: 90 tablet, Rfl: 6 .  naproxen sodium (ANAPROX) 220 MG tablet, Take 440 mg by mouth 2 (two) times daily with a meal., Disp: , Rfl:  .  rizatriptan (MAXALT-MLT) 10 MG disintegrating tablet, Take 1 tablet (10 mg total) by mouth as needed for migraine. May repeat in 2 hours if needed; max 2 per 24 hrs, Disp: 10 tablet, Rfl: 6  PAST MEDICAL HISTORY: Past Medical History:  Diagnosis Date  . Complication of anesthesia   .  History of kidney stones   . Left ureteral stone   . Migraine   . PONV (postoperative nausea and vomiting)   . Renal calculi    bilateral per ct 07-26-2016  . Scoliosis of lumbar spine 1994   treated at [facility] until age 5  . Wears glasses     PAST SURGICAL HISTORY: Past Surgical History:  Procedure Laterality Date  . CYSTOSCOPY W/ RETROGRADES Right 07/06/2015   Procedure: CYSTOSCOPY WITH RETROGRADE PYELOGRAM;  Surgeon: Jerilee Field, MD;  Location: [facility];  Service: Urology;  Laterality: Right;  . CYSTOSCOPY W/ URETERAL STENT PLACEMENT Left 08/08/2015   Procedure: CYSTOSCOPY WITH STENT REPLACEMENT;  Surgeon: Jerilee Field, MD;  Location[facility];  Service: Urology;  Laterality: Left;  . CYSTOSCOPY W/ URETERAL STENT PLACEMENT Left 02/21/2017   Procedure: CYSTOSCOPY WITH RETROGRADE PYELOGRAM/URETERAL LEFT STENT PLACEMENT WITH  LASER;  Surgeon: Bjorn Pippin, MD;  Location[facility];  Service: Urology;  Laterality: Left;  . CYSTOSCOPY WITH RETROGRADE PYELOGRAM, URETEROSCOPY AND STENT PLACEMENT Left 06/24/2014   Procedure: CYSTOSCOPY WITH RETROGRADE PYELOGRAM,  AND STENT PLACEMENT;  Surgeon: Magdalene Molly, MD;  Location[facility];  Service: Urology;  Laterality: Left;  . CYSTOSCOPY WITH RETROGRADE PYELOGRAM, URETEROSCOPY AND STENT PLACEMENT Left 07/05/2014   Procedure: CYSTO/LEFT URETEROSCOPY/LEFT RETROGRADE PYELOGRAM/LEFT STENT PLACEMENT;  Surgeon: Jerilee Field, MD;  Location: [facility];  Service: Urology;  Laterality: Left;  . CYSTOSCOPY WITH RETROGRADE PYELOGRAM, URETEROSCOPY AND STENT PLACEMENT Left 07/06/2015   Procedure: CYSTOSCOPY WITH RETROGRADE PYELOGRAM, URETEROSCOPY , LASER, STENT PLACEMENT and BASKET EXTRACTION;  Surgeon: Jerilee Field, MD;  Location[facility];  Service: Urology;  Laterality: Left;  . CYSTOSCOPY WITH RETROGRADE PYELOGRAM, URETEROSCOPY AND STENT PLACEMENT Left 08/19/2015   Procedure: CYSTOSCOPY WITH RETROGRADE PYELOGRAM,  URETEROSCOPY AND STENT PLACEMENT;  Surgeon: Jerilee Field, MD;  [facility];  Service: Urology;  Laterality: Left;  . CYSTOSCOPY WITH URETEROSCOPY AND STENT PLACEMENT Left 01/16/2016   Procedure: CYSTOSCOPY WITH LEFT RETROGRADE PYELOGRAM  LEFT DIGITAL URETEROSCOPY AND PLACEMENT LEFT URETERAL STENT;  Surgeon: Jerilee Field, MD;  [facility];  Service: Urology;  Laterality: Left;  . CYSTOSCOPY WITH URETEROSCOPY, STONE BASKETRY AND STENT PLACEMENT Left 02/13/2016   Procedure: CYSTOSCOPY WITH LEFT URETEROSCOPY, HOLMIUM LASER AND STENT PLACEMENT;  Surgeon: Jerilee Field, MD;  Location[facility];  Service: Urology;  Laterality: Left;  . CYSTOSCOPY/RETROGRADE/URETEROSCOPY/STONE EXTRACTION WITH BASKET Left 08/08/2015   Procedure: CYSTOSCOPY/RETROGRADE/URETEROSCOPY/STONE EXTRACTION WITH BASKET;  Surgeon: Jerilee Field, MD;  Location: [facility];  Service: Urology;  Laterality: Left;  . CYSTOSCOPY/URETEROSCOPY/HOLMIUM LASER/STENT PLACEMENT Left 07/30/2016   Procedure: CYSTOSCOPY/URETEROSCOPY/HOLMIUM LASER/STENT PLACEMENT;  Surgeon: Jerilee Field, MD;  Location: [facility];  Service: Urology;  Laterality: Left;  . EXTRACORPOREAL SHOCK WAVE LITHOTRIPSY Left 07-07-2015  &  12-26-2014  . 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 HOLMIUM LASER APPLICATION Left 07/05/2014  Procedure: LASER LITHO;  Surgeon: Jerilee Field, MD;  Location: [facility];  Service: Urology;  Laterality: Left;  . HOLMIUM LASER APPLICATION Left 08/08/2015   Procedure: HOLMIUM LASER  WITH LITHOTRIPSY ;  Surgeon: Jerilee Field, MD;  Location: [facility];  Service: Urology;  Laterality: Left;  . HOLMIUM LASER APPLICATION Left 02/21/2017   Procedure: HOLMIUM LASER APPLICATION;  Surgeon: Bjorn Pippin, MD;  [facility];  Service: Urology;  Laterality: Left;  . LYMPH GLAND EXCISION  2003    neck--  benign    FAMILY HISTORY: Family History  Problem Relation Age of Onset  . Cancer Father   . Kidney Stones Mother   . Cancer Other   . Hypertension Other   . Hyperlipidemia Other   . Stroke Other   . Kidney Stones Brother     SOCIAL HISTORY:  Social History   Social History  . Marital status: Single    Spouse name: N/A  . Number of children: N/A  . Years of education: N/A   Occupational History  . Not on file.   Social History Main Topics  . Smoking status: Former Smoker    Years: 2.00    Quit date: 07/29/2014  . Smokeless tobacco: Never Used     Comment: social smoker  . Alcohol use 0.0 oz/week     Comment: occ  . Drug use: No  . Sexual activity: No   Other Topics Concern  . Not on file   Social History Narrative  . No narrative on file     PHYSICAL EXAM  Vitals:   03/10/17 1040  BP: 133/84  Pulse: 76  Resp: 16  Weight: 223 lb 8 oz (101.4 kg)  Height: 5\' 8"  (1.727 m)    Body mass index is 33.98 kg/m.   General: The patient is well-developed and well-nourished and in no acute distress  Neck:    The neck is tender over the right splenius capitis muscle in the occipital nerve..  Neurologic Exam  Mental status: The patient is alert and oriented x 3 at the time of the examination. The patient has apparent normal recent and remote memory, with an apparently normal attention span and concentration ability.   Speech is normal.  Cranial nerves: Extraocular movements are full.  Facial strength is normal..  Trapezius and sternocleidomastoid strength is normal. No dysarthria is noted.  The tongue is midline, and the patient has symmetric elevation of the soft palate. No obvious hearing deficits are noted.  Motor:  Muscle bulk is normal.   Tone is normal. Strength is  5 / 5 in all 4 extremities.   Coordination: Cerebellar testing reveals good finger-nose-finger ilaterally.  Gait and station: Station is normal.   Gait is normal.          DIAGNOSTIC DATA (LABS, IMAGING, TESTING) - I reviewed patient records, labs, notes, testing and imaging myself where available.  Lab Results  Component Value Date   WBC 12.3 (H) 02/20/2017   HGB 15.6 02/20/2017   HCT 43.0 02/20/2017   MCV 86.0 02/20/2017   PLT 177 02/20/2017      Component Value Date/Time   NA 137 02/20/2017 1341   K 3.9 02/20/2017 1341   CL 105 02/20/2017 1341   CO2 23 02/20/2017 1341   GLUCOSE 89 02/20/2017 1341   BUN 21 (H) 02/20/2017 1341   CREATININE 1.27 (H) 02/20/2017 1341   CALCIUM 9.1 02/20/2017 1341   PROT 7.8 12/26/2016 1601  ALBUMIN 4.3 12/26/2016 1601   AST 37 12/26/2016 1601   ALT 40 12/26/2016 1601   ALKPHOS 57 12/26/2016 1601   BILITOT 0.5 12/26/2016 1601   GFRNONAA >60 02/20/2017 1341   GFRAA >60 02/20/2017 1341       ASSESSMENT AND PLAN  Chronic migraine  Migraine without aura and without status migrainosus, not intractable  Neck pain  Kidney stone    1.  Botox 185 u:  Frontalis (5 units 6), temporalis (5 units 8), occipitalis (5 units 6), corrugators and procerus (5 units 3), splenius capitis (15 units 2), trapezius (10 units 2), C6-C7 paraspinal muscles (10 units 2), wasted 15 units 2.   Continue prn Maxalt. 3.   If he does better we can start tapering Keppra 4.   Return for Botox in 3 months or call sooner if there are significant changes in the headache frequency or other new neurologic symptoms.    Thomas Howell A. Epimenio FootSater, MD, PhD 03/10/2017, 5:59 PM Certified in Neurology, Clinical Neurophysiology, Sleep Medicine, Pain Medicine and Neuroimaging  [facility] [address], [address] [place], [place] [address] [phone]

## 2017-04-10 ENCOUNTER — Other Ambulatory Visit: Payer: Self-pay | Admitting: Neurology

## 2017-04-19 ENCOUNTER — Ambulatory Visit: Payer: BLUE CROSS/BLUE SHIELD | Admitting: Neurology

## 2017-05-23 NOTE — Addendum Note (Signed)
Addendum  created 05/23/17 1202 by Myles Tavella, MD   Sign clinical note    

## 2017-06-06 ENCOUNTER — Other Ambulatory Visit: Payer: Self-pay | Admitting: Neurology

## 2017-06-08 DIAGNOSIS — G43709 Chronic migraine without aura, not intractable, without status migrainosus: Secondary | ICD-10-CM | POA: Diagnosis not present

## 2017-06-09 ENCOUNTER — Telehealth: Payer: Self-pay | Admitting: Neurology

## 2017-06-09 ENCOUNTER — Encounter: Payer: Self-pay | Admitting: Neurology

## 2017-06-09 ENCOUNTER — Ambulatory Visit (INDEPENDENT_AMBULATORY_CARE_PROVIDER_SITE_OTHER): Payer: BLUE CROSS/BLUE SHIELD | Admitting: Neurology

## 2017-06-09 VITALS — BP 115/78 | HR 55 | Resp 16 | Ht 68.0 in | Wt 236.5 lb

## 2017-06-09 DIAGNOSIS — G43709 Chronic migraine without aura, not intractable, without status migrainosus: Secondary | ICD-10-CM

## 2017-06-09 DIAGNOSIS — M542 Cervicalgia: Secondary | ICD-10-CM

## 2017-06-09 DIAGNOSIS — IMO0002 Reserved for concepts with insufficient information to code with codable children: Secondary | ICD-10-CM

## 2017-06-09 NOTE — Telephone Encounter (Signed)
Please call pt in am to schedule BOTOX appt

## 2017-06-09 NOTE — Progress Notes (Signed)
[facility]  PATIENT: Thomas Howell DOB: May 18, 1991  REFERRING DOCTOR OR PCP:  Ardyth Gal SOURCE: patient, records in EMR  _________________________________   HISTORICAL  CHIEF COMPLAINT:  Chief Complaint  Patient presents with  . Migraines    Here today for Botox inj. for migraines.  Sts. h/a's were less frequent,less severe after 1st set of Botox inj/fim    HISTORY OF PRESENT ILLNESS:  Thomas Howell  is a 26 yo man with chronic migraines  He has had frequent migraine headaches since age 32.  Headaches have persisted despite multiple prophylactic regimens.    He started Botox 02/2017.   For the first 10 weeks, he had less than one migraine a week but has had a few the past 2 weeks.   When present, Tylenol sometimes helps and Maxalt helps if the Tylenol does not.   His neck pain also improved with the Botox.  Chronic Migraine History:   He was experiencing 20-24 headache days of lasting > 4 hours each day.   He also has milder occipital headache pain every day (30/30 a month).     A typical headache starts in the occiput and then radiates to the eye (usually on the right) with a pounding quality.      He has no preceding aura.   He gets nausea and vomiting.   Moving increases the headache and laying in bed in a dark quiet room helps.    He has associated phonophobia and photophobia. Most headaches are random but some are triggered by strong smells such as perfume.     Maxalt helps some of the migraine headaches.  Often he has to take 2 over the day to get rid of the headache .   He uses about 10/month often in combination with Aleve.   Phenergan helps nausea    If he falls asleep, pain is oftebetter upon awakening.      HA treatment History:  Topiramate had some benefit but cause kidney stones and he needed to stop. Amitriptyline caused grogginess the next day.   Nortriptyline had not helped.    Keppra and lamotriine did not help the headache frequency much.   Aleve and other NSAID's by themself have not helped.      REVIEW OF SYSTEMS: Constitutional: No fevers, chills, sweats, or change in appetite Eyes: No visual changes, double vision, eye pain Ear, nose and throat: No hearing loss, ear pain, nasal congestion, sore throat Cardiovascular: No chest pain, palpitations Respiratory: No shortness of breath at rest or with exertion.   No wheezes GastrointestinaI: No nausea, vomiting, diarrhea, abdominal pain, fecal incontinence Genitourinary: No dysuria, urinary retention or frequency.  He has frequent kidney stones (sees [facility]. Mena Goes at [facility]) Musculoskeletal: No neck pain, back pain Integumentary: No rash, pruritus, skin lesions Neurological: as above Psychiatric: No depression at this time.  No anxiety Endocrine: No palpitations, diaphoresis, change in appetite, change in weigh or increased thirst Hematologic/Lymphatic: No anemia, purpura, petechiae. Allergic/Immunologic: No itchy/runny eyes, nasal congestion, recent allergic reactions, rashes  ALLERGIES: Allergies  Allergen Reactions  . Codeine Swelling    Lips swell//  This includes cough syrup w/ codeine  . Adhesive [Tape] Rash    HOME MEDICATIONS:  Current Outpatient Prescriptions:  .  BOTOX 100 units SOLR injection, INJECT IN THE MUSCLE TO HEAD AND NECK MUSCLES EVERY 3 MONTHS BY PROVIDER, Disp: 2 each, Rfl: 2 .  lamoTRIgine (LAMICTAL) 100 MG tablet, Take 1 tablet (100 mg total) by  mouth daily. Take 1/2 po qd x 5 days, then 1/2 po bid x 5 d, then 1/2 po tid x 5d then 1 po bid (Patient taking differently: Take 100 mg by mouth 2 (two) times daily. ), Disp: 60 tablet, Rfl: 11 .  levETIRAcetam (KEPPRA) 750 MG tablet, TAKE 1 TABLET BY MOUTH EVERY MORNING AND 2 TABLETS EVERY NIGHT AT BEDTIME, Disp: 90 tablet, Rfl: 2 .  naproxen sodium (ANAPROX) 220 MG tablet, Take 440 mg by mouth 2 (two) times daily with a meal., Disp: , Rfl:  .  rizatriptan (MAXALT-MLT) 10 MG disintegrating  tablet, Take 1 tablet (10 mg total) by mouth as needed for migraine. May repeat in 2 hours if needed; max 2 per 24 hrs, Disp: 10 tablet, Rfl: 6  PAST MEDICAL HISTORY: Past Medical History:  Diagnosis Date  . Complication of anesthesia   . History of kidney stones   . Left ureteral stone   . Migraine   . PONV (postoperative nausea and vomiting)   . Renal calculi    bilateral per ct 07-26-2016  . Scoliosis of lumbar spine 1994   treated at [facility] until age 89  . Wears glasses     PAST SURGICAL HISTORY: Past Surgical History:  Procedure Laterality Date  . CYSTOSCOPY W/ RETROGRADES Right 07/06/2015   Procedure: CYSTOSCOPY WITH RETROGRADE PYELOGRAM;  Surgeon: Jerilee FieldMatthew Eskridge, MD;  Location[facility];  Service: Urology;  Laterality: Right;  . CYSTOSCOPY W/ URETERAL STENT PLACEMENT Left 08/08/2015   Procedure: CYSTOSCOPY WITH STENT REPLACEMENT;  Surgeon: Jerilee FieldMatthew Eskridge, MD;  [facility] [facility];  Service: Urology;  Laterality: Left;  . CYSTOSCOPY W/ URETERAL STENT PLACEMENT Left 02/21/2017   Procedure: CYSTOSCOPY WITH RETROGRADE PYELOGRAM/URETERAL LEFT STENT PLACEMENT WITH  LASER;  Surgeon: Bjorn PippinJohn Wrenn, MD;  Location[facility];  Service: Urology;  Laterality: Left;  . CYSTOSCOPY WITH RETROGRADE PYELOGRAM, URETEROSCOPY AND STENT PLACEMENT Left 06/24/2014   Procedure: CYSTOSCOPY WITH RETROGRADE PYELOGRAM,  AND STENT PLACEMENT;  Surgeon: Magdalene Mollyaniel Y Woodruff, MD;  Location[facility];  Service: Urology;  Laterality: Left;  . CYSTOSCOPY WITH RETROGRADE PYELOGRAM, URETEROSCOPY AND STENT PLACEMENT Left 07/05/2014   Procedure: CYSTO/LEFT URETEROSCOPY/LEFT RETROGRADE PYELOGRAM/LEFT STENT PLACEMENT;  Surgeon: Jerilee FieldMatthew Eskridge, MD;  [facility] [place];  Service: Urology;  Laterality: Left;  . CYSTOSCOPY WITH RETROGRADE PYELOGRAM, URETEROSCOPY AND STENT PLACEMENT Left 07/06/2015   Procedure: CYSTOSCOPY WITH RETROGRADE PYELOGRAM, URETEROSCOPY , LASER, STENT PLACEMENT and BASKET  EXTRACTION;  Surgeon: Jerilee FieldMatthew Eskridge, MD;  [facility];  Service: Urology;  Laterality: Left;  . CYSTOSCOPY WITH RETROGRADE PYELOGRAM, URETEROSCOPY AND STENT PLACEMENT Left 08/19/2015   Procedure: CYSTOSCOPY WITH RETROGRADE PYELOGRAM, URETEROSCOPY AND STENT PLACEMENT;  Surgeon: Jerilee FieldMatthew Eskridge, MD;  [facility];  Service: Urology;  Laterality: Left;  . CYSTOSCOPY WITH URETEROSCOPY AND STENT PLACEMENT Left 01/16/2016   Procedure: CYSTOSCOPY WITH LEFT RETROGRADE PYELOGRAM  LEFT DIGITAL URETEROSCOPY AND PLACEMENT LEFT URETERAL STENT;  Surgeon: Jerilee FieldMatthew Eskridge, MD;  Location[facility];  Service: Urology;  Laterality: Left;  . CYSTOSCOPY WITH URETEROSCOPY, STONE BASKETRY AND STENT PLACEMENT Left 02/13/2016   Procedure: CYSTOSCOPY WITH LEFT URETEROSCOPY, HOLMIUM LASER AND STENT PLACEMENT;  Surgeon: Jerilee FieldMatthew Eskridge, MD;  Location[facility];  Service: Urology;  Laterality: Left;  . CYSTOSCOPY/RETROGRADE/URETEROSCOPY/STONE EXTRACTION WITH BASKET Left 08/08/2015   Procedure: CYSTOSCOPY/RETROGRADE/URETEROSCOPY/STONE EXTRACTION WITH BASKET;  Surgeon: Jerilee FieldMatthew Eskridge, MD;  [facility] [place];  Service: Urology;  Laterality: Left;  . CYSTOSCOPY/URETEROSCOPY/HOLMIUM LASER/STENT PLACEMENT Left 07/30/2016   Procedure: CYSTOSCOPY/URETEROSCOPY/HOLMIUM LASER/STENT PLACEMENT;  Surgeon: Jerilee Field, MD;  Location: [facility];  Service: Urology;  Laterality: Left;  . EXTRACORPOREAL SHOCK WAVE LITHOTRIPSY Left 07-07-2015  &  12-26-2014  . 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 HOLMIUM LASER APPLICATION Left 07/05/2014   Procedure: LASER LITHO;  Surgeon: Jerilee Field, MD;  Location: [facility];  Service: Urology;  Laterality: Left;  . HOLMIUM LASER APPLICATION Left 08/08/2015   Procedure: HOLMIUM LASER  WITH LITHOTRIPSY ;  Surgeon: Jerilee Field, MD;  Location: [facility];   Service: Urology;  Laterality: Left;  . HOLMIUM LASER APPLICATION Left 02/21/2017   Procedure: HOLMIUM LASER APPLICATION;  Surgeon: Bjorn Pippin, MD;  [facility];  Service: Urology;  Laterality: Left;  . LYMPH GLAND EXCISION  2003   neck--  benign    FAMILY HISTORY: Family History  Problem Relation Age of Onset  . Cancer Father   . Kidney Stones Mother   . Cancer Other   . Hypertension Other   . Hyperlipidemia Other   . Stroke Other   . Kidney Stones Brother     SOCIAL HISTORY:  Social History   Social History  . Marital status: Single    Spouse name: N/A  . Number of children: N/A  . Years of education: N/A   Occupational History  . Not on file.   Social History Main Topics  . Smoking status: Former Smoker    Years: 2.00    Quit date: 07/29/2014  . Smokeless tobacco: Never Used     Comment: social smoker  . Alcohol use 0.0 oz/week     Comment: occ  . Drug use: No  . Sexual activity: No   Other Topics Concern  . Not on file   Social History Narrative  . No narrative on file     PHYSICAL EXAM  Vitals:   06/09/17 1404  BP: 115/78  Pulse: (!) 55  Resp: 16  Weight: 236 lb 8 oz (107.3 kg)  Height: 5\' 8"  (1.727 m)    Body mass index is 35.96 kg/m.   General: The patient is well-developed and well-nourished and in no acute distress  Neck:    The neck is slightly tender over the right splenius capitis muscle .  Neurologic Exam  Mental status: The patient is alert and oriented x 3 at the time of the examination. The patient has apparent normal recent and remote memory, with an apparently normal attention span and concentration ability.   Speech is normal.  Cranial nerves: Extraocular movements are full.  Facial strength is normal..     Motor:  Muscle bulk is normal.   Tone is normal. Strength is  5 / 5 in all 4 extremities.    Gait and station: Station is normal.   Gait is normal.         DIAGNOSTIC DATA (LABS, IMAGING, TESTING) - I  reviewed patient records, labs, notes, testing and imaging myself where available.  Lab Results  Component Value Date   WBC 12.3 (H) 02/20/2017   HGB 15.6 02/20/2017   HCT 43.0 02/20/2017   MCV 86.0 02/20/2017   PLT 177 02/20/2017      Component Value Date/Time   NA 137 02/20/2017 1341   K 3.9 02/20/2017 1341   CL 105 02/20/2017 1341   CO2 23 02/20/2017 1341   GLUCOSE 89 02/20/2017 1341   BUN 21 (H) 02/20/2017 1341   CREATININE  1.27 (H) 02/20/2017 1341   CALCIUM 9.1 02/20/2017 1341   PROT 7.8 12/26/2016 1601   ALBUMIN 4.3 12/26/2016 1601   AST 37 12/26/2016 1601   ALT 40 12/26/2016 1601   ALKPHOS 57 12/26/2016 1601   BILITOT 0.5 12/26/2016 1601   GFRNONAA >60 02/20/2017 1341   GFRAA >60 02/20/2017 1341       ASSESSMENT AND PLAN  Chronic migraine  Neck pain    1.  Botox 185 u:  Frontalis (5 units 6), temporalis (5 units 8), occipitalis (5 units 6), corrugators and procerus (5 units 3), splenius capitis (15 units 2), trapezius (10 units 2), C6-C7 paraspinal muscles (10 units 2), wasted 15 units 2.   Continue prn Maxalt. 3.    Return for Botox in 3 months or call sooner if there are significant changes in the headache frequency or other new neurologic symptoms.   We will consider tapering lamotrigine or Keppraat the next visit.   Charrisse Masley A. Epimenio Foot, MD, PhD 06/09/2017, 2:11 PM Certified in Neurology, Clinical Neurophysiology, Sleep Medicine, Pain Medicine and Neuroimaging  [facility] [address] [place], [place] [address] [phone]

## 2017-06-14 ENCOUNTER — Ambulatory Visit: Payer: BLUE CROSS/BLUE SHIELD | Admitting: Neurology

## 2017-06-15 NOTE — Telephone Encounter (Signed)
I called and left patient a VM. 

## 2017-06-27 DIAGNOSIS — N200 Calculus of kidney: Secondary | ICD-10-CM | POA: Diagnosis not present

## 2017-07-04 IMAGING — DX DG ABDOMEN 1V
2 series · 2 of 2 positions shown · non-contrast
Comparison: CT abdomen and pelvis 07/26/2016, 06/15/2016,
01/15/2016, 06/26/2015, 06/17/2014 and earlier. Abdominal x-ray
09/03/2015, 07/22/2015 and earlier.

CLINICAL DATA: 25-year-old with left flank pain that began
approximately noon today. Current history of urinary tract calculi.

EXAM:
ABDOMEN - 1 VIEW

[abdomen kub (1 of 2)]
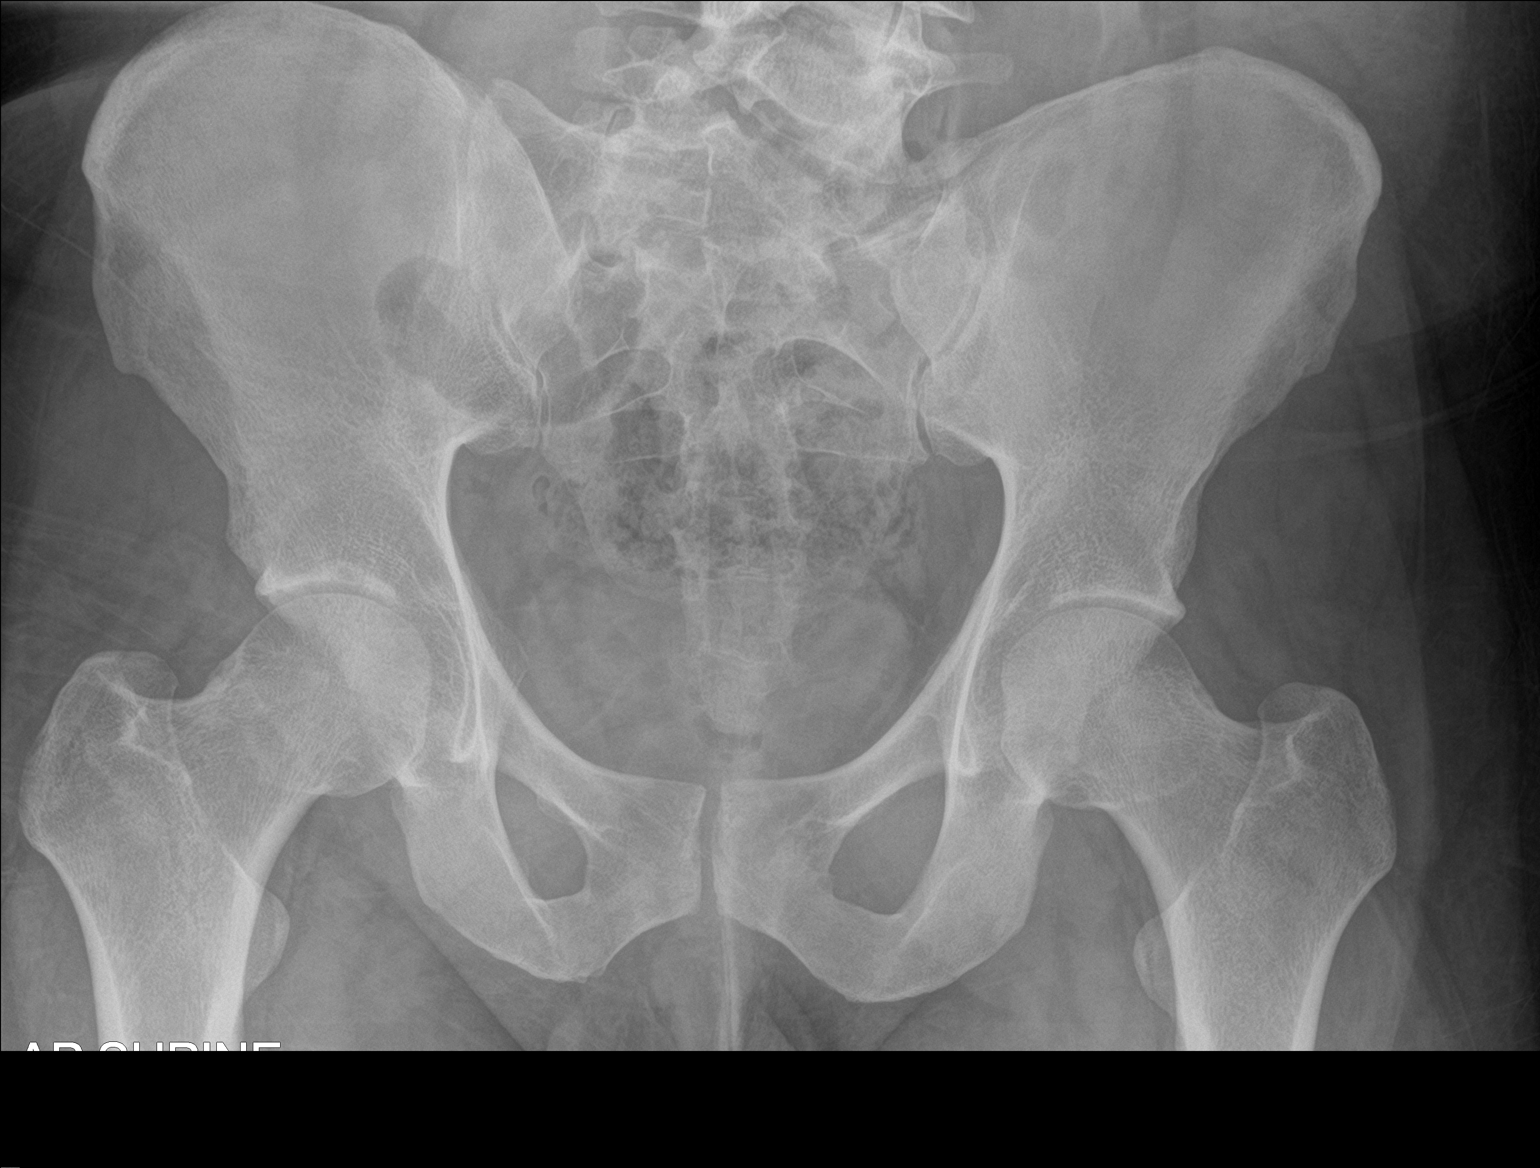

[abdomen kub (2 of 2)]
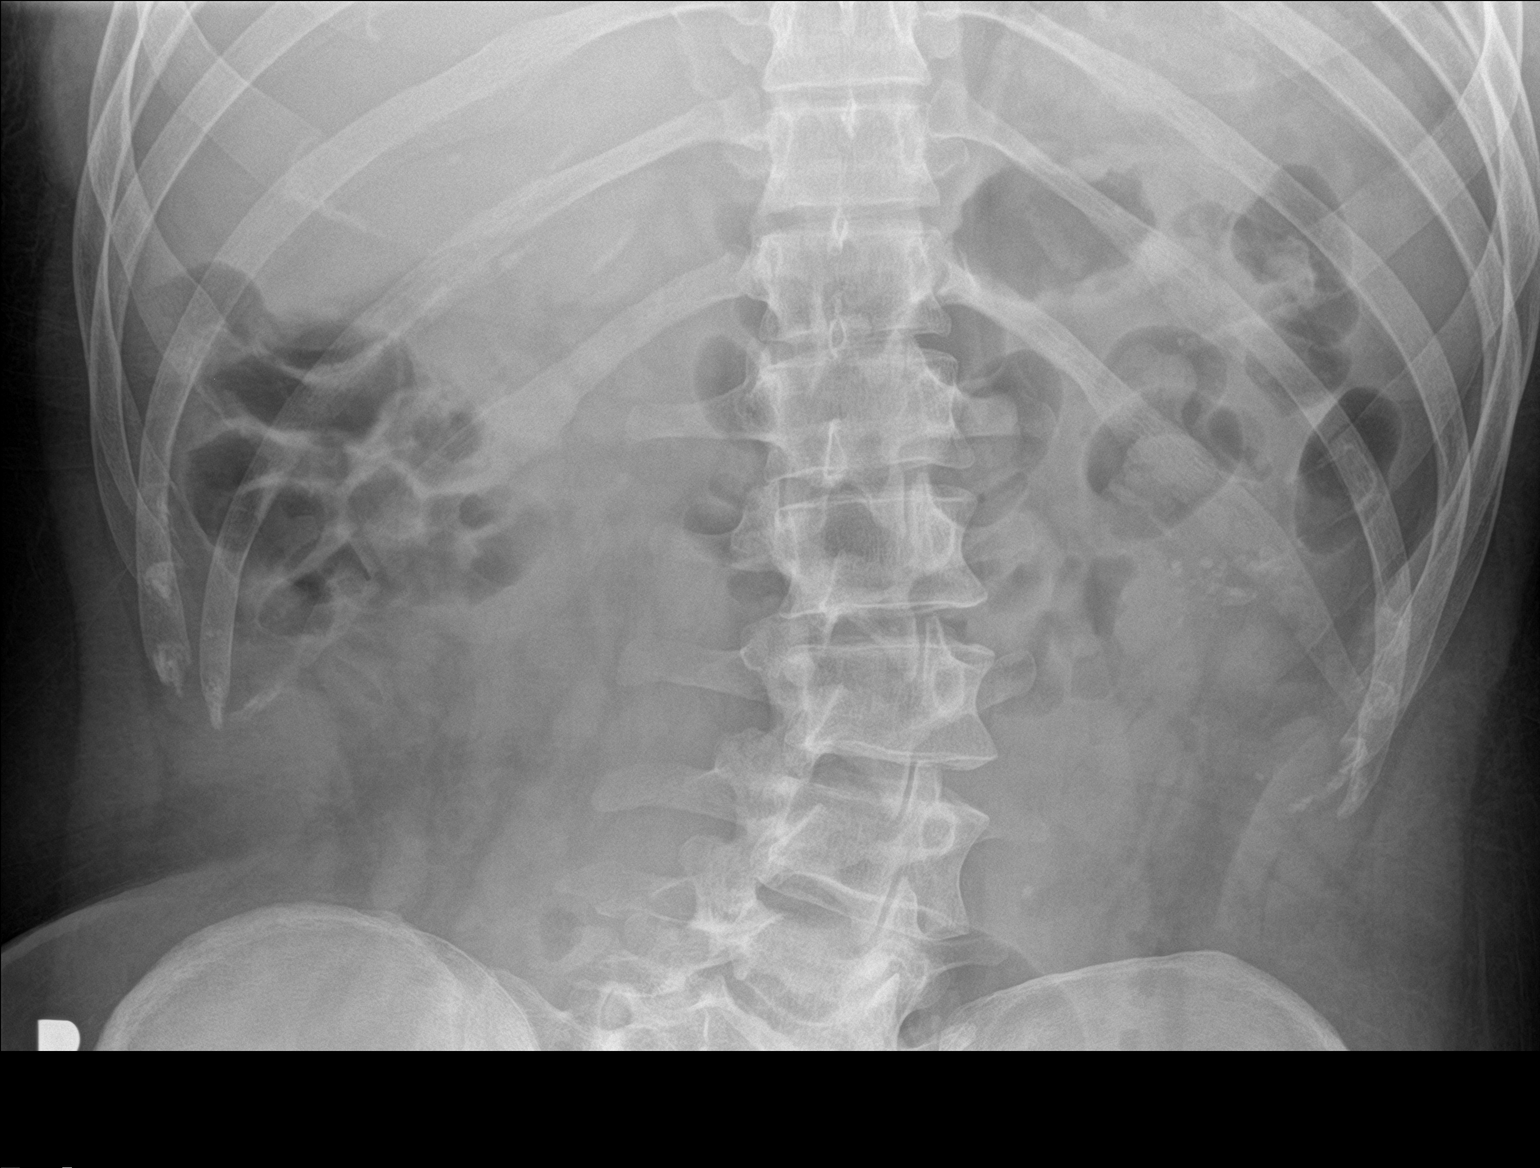

[2 of 2 positions shown; findings below may reference images not displayed]

FINDINGS: Calcification projecting at the expected location of the mid left
ureter, between the left transverse processes of L4 and L5. Left
lower pole renal calculi as noted on the most recent prior CT. The
small right lower pole renal calculus identified on that examination
is no longer visible.

Bowel gas pattern unremarkable without evidence of obstruction or
significant ileus. Lumbar levoscoliosis is again noted.
IMPRESSION: Left mid ureteral calculus.

## 2017-07-13 ENCOUNTER — Encounter (HOSPITAL_COMMUNITY): Payer: Self-pay | Admitting: *Deleted

## 2017-07-13 ENCOUNTER — Emergency Department (HOSPITAL_COMMUNITY)
Admission: EM | Admit: 2017-07-13 | Discharge: 2017-07-14 | Disposition: A | Discharge status: Home / Self Care | Payer: BLUE CROSS/BLUE SHIELD | Attending: Emergency Medicine | Admitting: Emergency Medicine

## 2017-07-13 DIAGNOSIS — G44209 Tension-type headache, unspecified, not intractable: Secondary | ICD-10-CM | POA: Diagnosis not present

## 2017-07-13 DIAGNOSIS — Z79899 Other long term (current) drug therapy: Secondary | ICD-10-CM | POA: Diagnosis not present

## 2017-07-13 DIAGNOSIS — Z87891 Personal history of nicotine dependence: Secondary | ICD-10-CM | POA: Insufficient documentation

## 2017-07-13 DIAGNOSIS — R51 Headache: Secondary | ICD-10-CM | POA: Diagnosis not present

## 2017-07-13 LAB — BASIC METABOLIC PANEL
Anion gap: 11 (ref 5–15)
BUN: 16 mg/dL (ref 6–20)
CO2: 26 mmol/L (ref 22–32)
Calcium: 9.6 mg/dL (ref 8.9–10.3)
Chloride: 104 mmol/L (ref 101–111)
Creatinine, Ser: 0.94 mg/dL (ref 0.61–1.24)
GFR calc Af Amer: >60 mL/min (ref >60)
GFR calc non Af Amer: >60 mL/min (ref >60)
Glucose, Bld: 94 mg/dL (ref 65–99)
Potassium: 4 mmol/L (ref 3.5–5.1)
Sodium: 141 mmol/L (ref 135–145)

## 2017-07-13 MED ORDER — DIPHENHYDRAMINE HCL 50 MG/ML IJ SOLN
25.0000 mg | Freq: Once | INTRAMUSCULAR | Status: AC
  Administered 2017-07-13: 25 mg via INTRAVENOUS
  Filled 2017-07-13: qty 1

## 2017-07-13 MED ORDER — KETOROLAC TROMETHAMINE 30 MG/ML IJ SOLN
30.0000 mg | Freq: Once | INTRAMUSCULAR | Status: AC
  Administered 2017-07-13: 30 mg via INTRAVENOUS
  Filled 2017-07-13: qty 1

## 2017-07-13 MED ORDER — SODIUM CHLORIDE 0.9 % IV SOLN
Freq: Once | INTRAVENOUS | Status: AC
  Administered 2017-07-13: 23:00:00 via INTRAVENOUS

## 2017-07-13 MED ORDER — METOCLOPRAMIDE HCL 5 MG/ML IJ SOLN
10.0000 mg | Freq: Once | INTRAMUSCULAR | Status: AC
  Administered 2017-07-13: 10 mg via INTRAVENOUS
  Filled 2017-07-13: qty 2

## 2017-07-13 NOTE — ED Triage Notes (Signed)
Pt c/o headache with n/v that started when he got up this am,

## 2017-07-13 NOTE — ED Provider Notes (Signed)
[facility] Provider Note   CSN: [id number] Arrival date & time: 07/13/17  2207     History   Chief Complaint Chief Complaint  Patient presents with  . Headache    HPI EDVARDO Howell is a 26 y.o. male with a history of migraine and occipital headaches, treated with lamictal, keppra and maxalt in addition to botox injections every 3 months presenting with a new circumferential headache, described as "it feels like a belt is wrapped around my head" which he woke with this am and has been associated with nausea and vomiting.  He denies fevers, chills, vision changes or scotoma but does endorse light sensitivity.  He has had no medicines today for this headache, has been unable to tolerate PO intake.  He has generalized fatigue, denies focal weakness or numbness. Also reports neck soreness with certain movements, denies stiffness.   The history is provided by the patient.    Past Medical History:  Diagnosis Date  . Complication of anesthesia   . History of kidney stones   . Left ureteral stone   . Migraine   . PONV (postoperative nausea and vomiting)   . Renal calculi    bilateral per ct 07-26-2016  . Scoliosis of lumbar spine 1994   treated at [facility] until age 58  . Wears glasses     Patient Active Problem List   Diagnosis Date Noted  . Chronic migraine 02/14/2017  . Neck pain 02/04/2016  . Hydronephrosis, left 08/19/2015  . Hydronephrosis determined by ultrasound   . Kidney stone on left side 08/18/2015  . Nephrolithiasis 08/18/2015  . Kidney stone 07/06/2015  . Left ureteral stone 07/06/2015  . Analgesic rebound headache 06/05/2014  . Migraine headache without aura 05/30/2014    Past Surgical History:  Procedure Laterality Date  . CYSTOSCOPY W/ RETROGRADES Right 07/06/2015   Procedure: CYSTOSCOPY WITH RETROGRADE PYELOGRAM;  Surgeon: Jerilee Field, MD;  Location: [facility];  Service: Urology;  Laterality: Right;  . CYSTOSCOPY W/ URETERAL STENT PLACEMENT Left  08/08/2015   Procedure: CYSTOSCOPY WITH STENT REPLACEMENT;  Surgeon: Jerilee Field, MD;  Location: [facility];  Service: Urology;  Laterality: Left;  . CYSTOSCOPY W/ URETERAL STENT PLACEMENT Left 02/21/2017   Procedure: CYSTOSCOPY WITH RETROGRADE PYELOGRAM/URETERAL LEFT STENT PLACEMENT WITH  LASER;  Surgeon: Bjorn Pippin, MD;  Location: [facility];  Service: Urology;  Laterality: Left;  . CYSTOSCOPY WITH RETROGRADE PYELOGRAM, URETEROSCOPY AND STENT PLACEMENT Left 06/24/2014   Procedure: CYSTOSCOPY WITH RETROGRADE PYELOGRAM,  AND STENT PLACEMENT;  Surgeon: Magdalene Molly, MD;  Location: [facility];  Service: Urology;  Laterality: Left;  . CYSTOSCOPY WITH RETROGRADE PYELOGRAM, URETEROSCOPY AND STENT PLACEMENT Left 07/05/2014   Procedure: CYSTO/LEFT URETEROSCOPY/LEFT RETROGRADE PYELOGRAM/LEFT STENT PLACEMENT;  Surgeon: Jerilee Field, MD;  Location: [facility] [place];  Service: Urology;  Laterality: Left;  . CYSTOSCOPY WITH RETROGRADE PYELOGRAM, URETEROSCOPY AND STENT PLACEMENT Left 07/06/2015   Procedure: CYSTOSCOPY WITH RETROGRADE PYELOGRAM, URETEROSCOPY , LASER, STENT PLACEMENT and BASKET EXTRACTION;  Surgeon: Jerilee Field, MD;  Location: [facility];  Service: Urology;  Laterality: Left;  . CYSTOSCOPY WITH RETROGRADE PYELOGRAM, URETEROSCOPY AND STENT PLACEMENT Left 08/19/2015   Procedure: CYSTOSCOPY WITH RETROGRADE PYELOGRAM, URETEROSCOPY AND STENT PLACEMENT;  Surgeon: Jerilee Field, MD;  Location: [facility];  Service: Urology;  Laterality: Left;  . CYSTOSCOPY WITH URETEROSCOPY AND STENT PLACEMENT Left 01/16/2016   Procedure: CYSTOSCOPY WITH LEFT RETROGRADE PYELOGRAM  LEFT DIGITAL URETEROSCOPY AND PLACEMENT LEFT URETERAL STENT;  Surgeon: Jerilee Field, MD;  Location:  [facility];  Service: Urology;  Laterality: Left;  . CYSTOSCOPY WITH URETEROSCOPY, STONE BASKETRY AND STENT PLACEMENT Left 02/13/2016   Procedure: CYSTOSCOPY WITH LEFT URETEROSCOPY, HOLMIUM LASER AND STENT PLACEMENT;  Surgeon:  Jerilee FieldMatthew Eskridge, MD;  Location: [facility];  Service: Urology;  Laterality: Left;  . CYSTOSCOPY/RETROGRADE/URETEROSCOPY/STONE EXTRACTION WITH BASKET Left 08/08/2015   Procedure: CYSTOSCOPY/RETROGRADE/URETEROSCOPY/STONE EXTRACTION WITH BASKET;  Surgeon: Jerilee FieldMatthew Eskridge, MD;  [facility] [place];  Service: Urology;  Laterality: Left;  . CYSTOSCOPY/URETEROSCOPY/HOLMIUM LASER/STENT PLACEMENT Left 07/30/2016   Procedure: CYSTOSCOPY/URETEROSCOPY/HOLMIUM LASER/STENT PLACEMENT;  Surgeon: Jerilee FieldMatthew Eskridge, MD;  [facility] [place];  Service: Urology;  Laterality: Left;  . EXTRACORPOREAL SHOCK WAVE LITHOTRIPSY Left 07-07-2015  &  12-26-2014  . 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 HOLMIUM LASER APPLICATION Left 07/05/2014   Procedure: LASER LITHO;  Surgeon: Jerilee FieldMatthew Eskridge, MD;  [facility] [place];  Service: Urology;  Laterality: Left;  . HOLMIUM LASER APPLICATION Left 08/08/2015   Procedure: HOLMIUM LASER  WITH LITHOTRIPSY ;  Surgeon: Jerilee FieldMatthew Eskridge, MD;  [facility] [place];  Service: Urology;  Laterality: Left;  . HOLMIUM LASER APPLICATION Left 02/21/2017   Procedure: HOLMIUM LASER APPLICATION;  Surgeon: Bjorn PippinJohn Wrenn, MD;  Location: [facility];  Service: Urology;  Laterality: Left;  . LYMPH GLAND EXCISION  2003   neck--  benign       Home Medications    Prior to Admission medications   Medication Sig Start Date End Date Taking? Authorizing Provider  acetaminophen (TYLENOL) 500 MG tablet Take 1,500 mg by mouth every 6 (six) hours as needed for moderate pain or headache.   Yes [provider]  BOTOX 100 units SOLR injection INJECT IN THE MUSCLE TO HEAD AND NECK MUSCLES EVERY 3 MONTHS BY PROVIDER 06/06/17  Yes Sater, Pearletha Furlichard A, MD  lamoTRIgine (LAMICTAL) 100 MG tablet Take 1 tablet (100 mg total) by mouth daily. Take 1/2 po qd x 5 days, then 1/2 po bid x 5 d, then  1/2 po tid x 5d then 1 po bid Patient taking differently: Take 100 mg by mouth 2 (two) times daily.  10/18/16  Yes Sater, Pearletha Furlichard A, MD  levETIRAcetam (KEPPRA) 750 MG tablet TAKE 1 TABLET BY MOUTH EVERY MORNING AND 2 TABLETS EVERY NIGHT AT BEDTIME 04/12/17  Yes Sater, Pearletha Furlichard A, MD  naproxen sodium (ANAPROX) 220 MG tablet Take 440 mg by mouth 2 (two) times daily with a meal.   Yes [provider]  rizatriptan (MAXALT-MLT) 10 MG disintegrating tablet Take 1 tablet (10 mg total) by mouth as needed for migraine. May repeat in 2 hours if needed; max 2 per 24 hrs 10/18/16  Yes Sater, Pearletha Furlichard A, MD  ondansetron (ZOFRAN ODT) 8 MG disintegrating tablet Take 1 tablet (8 mg total) by mouth every 8 (eight) hours as needed for nausea or vomiting. 07/14/17   Burgess AmorIdol, Azra Abrell, PA-C    Family History Family History  Problem Relation Age of Onset  . Cancer Father   . Kidney Stones Mother   . Cancer Other   . Hypertension Other   . Hyperlipidemia Other   . Stroke Other   . Kidney Stones Brother     Social History Social History  Substance Use Topics  . Smoking status: Former Smoker    Years: 2.00    Quit date: 07/29/2014  . Smokeless tobacco: Never Used     Comment: social smoker  . Alcohol use  0.0 oz/week     Comment: occ     Allergies   Codeine and Adhesive [tape]   Review of Systems Review of Systems  Constitutional: Negative for chills and fever.  HENT: Negative for congestion and sore throat.   Eyes: Positive for photophobia.  Respiratory: Negative for chest tightness and shortness of breath.   Cardiovascular: Negative for chest pain.  Gastrointestinal: Positive for nausea and vomiting. Negative for abdominal pain.  Genitourinary: Negative.   Musculoskeletal: Negative for arthralgias, joint swelling and neck pain.  Skin: Negative.  Negative for rash and wound.  Neurological: Positive for headaches. Negative for dizziness, speech difficulty, weakness, light-headedness and  numbness.  Psychiatric/Behavioral: Negative.      Physical Exam Updated Vital Signs BP 113/71 (BP Location: Right Arm)   Pulse 65   Temp 98 F (36.7 C) (Oral)   Resp 16   Ht 5\' 8"  (1.727 m)   Wt 99.8 kg (220 lb)   SpO2 97%   BMI 33.45 kg/m   Physical Exam  Constitutional: He is oriented to person, place, and time. He appears well-developed and well-nourished.  Uncomfortable appearing  HENT:  Head: Normocephalic and atraumatic.  Mouth/Throat: Oropharynx is clear and moist.  Eyes: Pupils are equal, round, and reactive to light. EOM are normal.  Neck: Normal range of motion. Neck supple.  Cardiovascular: Normal rate and normal heart sounds.   Pulmonary/Chest: Effort normal.  Abdominal: Soft. There is no tenderness.  Musculoskeletal: Normal range of motion.  Lymphadenopathy:    He has no cervical adenopathy.  Neurological: He is alert and oriented to person, place, and time. He has normal strength. No cranial nerve deficit or sensory deficit. Gait normal. GCS eye subscore is 4. GCS verbal subscore is 5. GCS motor subscore is 6.  Normal heel-shin, normal rapid alternating movements. Cranial nerves III-XII intact.  No pronator drift.  Skin: Skin is warm and dry. No rash noted.  Psychiatric: He has a normal mood and affect. His speech is normal and behavior is normal. Thought content normal. Cognition and memory are normal.  Nursing note and vitals reviewed.    ED Treatments / Results  Labs (all labs ordered are listed, but only abnormal results are displayed) Labs Reviewed  BASIC METABOLIC PANEL    EKG  EKG Interpretation None       Radiology No results found.  Procedures Procedures (including critical care time)  Medications Ordered in ED Medications  0.9 %  sodium chloride infusion ( Intravenous Stopped 07/14/17 0050)  metoCLOPramide (REGLAN) injection 10 mg (10 mg Intravenous Given 07/13/17 2342)  ketorolac (TORADOL) 30 MG/ML injection 30 mg (30 mg  Intravenous Given 07/13/17 2342)  diphenhydrAMINE (BENADRYL) injection 25 mg (25 mg Intravenous Given 07/13/17 2342)  diphenhydrAMINE (BENADRYL) injection 25 mg (25 mg Intravenous Given 07/14/17 0024)  ondansetron (ZOFRAN) injection 4 mg (4 mg Intravenous Given 07/14/17 0049)  dexamethasone (DECADRON) injection 10 mg (10 mg Intravenous Given 07/14/17 0049)     Initial Impression / Assessment and Plan / ED Course  I have reviewed the triage vital signs and the nursing notes.  Pertinent labs & imaging results that were available during my care of the patient were reviewed by me and considered in my medical decision making (see chart for details).     Pt given IV fluids, reglan, toradol and benadryl.  Headache improved, now 3/10, still with nausea.  Zofran, decadron added.  PO challenge.  Sx improved, tolerated PO's.  Prn f/u anticipated. Pt with tension like  headache, no neuro findings to suggest need for imaging.    Final Clinical Impressions(s) / ED Diagnoses   Final diagnoses:  Acute non intractable tension-type headache    New Prescriptions New Prescriptions   ONDANSETRON (ZOFRAN ODT) 8 MG DISINTEGRATING TABLET    Take 1 tablet (8 mg total) by mouth every 8 (eight) hours as needed for nausea or vomiting.     Burgess Amordol, Adithya Difrancesco, PA-C 07/14/17 0114    Gilda CreasePollina, Christopher J, MD 07/14/17 [phone]

## 2017-07-14 MED ORDER — ONDANSETRON HCL 4 MG/2ML IJ SOLN
4.0000 mg | Freq: Once | INTRAMUSCULAR | Status: AC
  Administered 2017-07-14: 4 mg via INTRAVENOUS
  Filled 2017-07-14: qty 2

## 2017-07-14 MED ORDER — ONDANSETRON 8 MG PO TBDP: 8.0000 mg | ORAL_TABLET | Freq: Three times a day (TID) | ORAL | 0 refills | Status: DC | PRN

## 2017-07-14 MED ORDER — DIPHENHYDRAMINE HCL 50 MG/ML IJ SOLN
25.0000 mg | Freq: Once | INTRAMUSCULAR | Status: AC
  Administered 2017-07-14: 25 mg via INTRAVENOUS
  Filled 2017-07-14: qty 1

## 2017-07-14 MED ORDER — DEXAMETHASONE SODIUM PHOSPHATE 10 MG/ML IJ SOLN
10.0000 mg | Freq: Once | INTRAMUSCULAR | Status: AC
  Administered 2017-07-14: 10 mg via INTRAVENOUS
  Filled 2017-07-14: qty 1

## 2017-07-14 NOTE — Discharge Instructions (Signed)
You have been prescribed zofran for use if your nausea persists or worsens.  Continue taking your current home medicines.

## 2017-07-14 NOTE — ED Notes (Signed)
Pt given sprite for fluid challenge °

## 2017-07-25 ENCOUNTER — Emergency Department (HOSPITAL_COMMUNITY): Payer: BLUE CROSS/BLUE SHIELD

## 2017-07-25 ENCOUNTER — Emergency Department (HOSPITAL_COMMUNITY)
Admission: EM | Admit: 2017-07-25 | Discharge: 2017-07-25 | Disposition: A | Discharge status: Home / Self Care | Payer: BLUE CROSS/BLUE SHIELD | Attending: Emergency Medicine | Admitting: Emergency Medicine

## 2017-07-25 ENCOUNTER — Encounter (HOSPITAL_COMMUNITY): Payer: Self-pay | Admitting: Emergency Medicine

## 2017-07-25 DIAGNOSIS — N200 Calculus of kidney: Secondary | ICD-10-CM | POA: Diagnosis not present

## 2017-07-25 DIAGNOSIS — Z87891 Personal history of nicotine dependence: Secondary | ICD-10-CM | POA: Insufficient documentation

## 2017-07-25 DIAGNOSIS — Z79899 Other long term (current) drug therapy: Secondary | ICD-10-CM | POA: Diagnosis not present

## 2017-07-25 DIAGNOSIS — R109 Unspecified abdominal pain: Secondary | ICD-10-CM | POA: Diagnosis not present

## 2017-07-25 LAB — CBC WITH DIFFERENTIAL/PLATELET
Basophils Absolute: 0 10*3/uL (ref 0.0–0.1)
Basophils Relative: 0 %
Eosinophils Absolute: 0 10*3/uL (ref 0.0–0.7)
Eosinophils Relative: 0 %
HCT: 42.9 % (ref 39.0–52.0)
Hemoglobin: 15.3 g/dL (ref 13.0–17.0)
Lymphocytes Relative: 27 %
Lymphs Abs: 3 10*3/uL (ref 0.7–4.0)
MCH: 30.5 pg (ref 26.0–34.0)
MCHC: 35.7 g/dL (ref 30.0–36.0)
MCV: 85.5 fL (ref 78.0–100.0)
Monocytes Absolute: 0.8 10*3/uL (ref 0.1–1.0)
Monocytes Relative: 7 %
Neutro Abs: 7.1 10*3/uL (ref 1.7–7.7)
Neutrophils Relative %: 66 %
Platelets: 188 10*3/uL (ref 150–400)
RBC: 5.02 MIL/uL (ref 4.22–5.81)
RDW: 12.3 % (ref 11.5–15.5)
WBC: 10.9 10*3/uL — ABNORMAL HIGH (ref 4.0–10.5)

## 2017-07-25 LAB — BASIC METABOLIC PANEL
Anion gap: 9 (ref 5–15)
BUN: 15 mg/dL (ref 6–20)
CO2: 24 mmol/L (ref 22–32)
Calcium: 8.9 mg/dL (ref 8.9–10.3)
Chloride: 105 mmol/L (ref 101–111)
Creatinine, Ser: 0.96 mg/dL (ref 0.61–1.24)
GFR calc Af Amer: >60 mL/min (ref >60)
GFR calc non Af Amer: >60 mL/min (ref >60)
Glucose, Bld: 91 mg/dL (ref 65–99)
Potassium: 3.9 mmol/L (ref 3.5–5.1)
Sodium: 138 mmol/L (ref 135–145)

## 2017-07-25 LAB — URINALYSIS, ROUTINE W REFLEX MICROSCOPIC
Bilirubin Urine: NEGATIVE
Glucose, UA: NEGATIVE mg/dL
Ketones, ur: NEGATIVE mg/dL
Nitrite: NEGATIVE
Protein, ur: NEGATIVE mg/dL
Specific Gravity, Urine: 1.024 (ref 1.005–1.030)
Squamous Epithelial / LPF: NONE SEEN
pH: 5 (ref 5.0–8.0)

## 2017-07-25 MED ORDER — KETOROLAC TROMETHAMINE 30 MG/ML IJ SOLN
30.0000 mg | Freq: Once | INTRAMUSCULAR | Status: AC
  Administered 2017-07-25: 30 mg via INTRAVENOUS
  Filled 2017-07-25: qty 1

## 2017-07-25 MED ORDER — ONDANSETRON HCL 4 MG PO TABS: 4.0000 mg | ORAL_TABLET | Freq: Three times a day (TID) | ORAL | 0 refills | Status: DC | PRN

## 2017-07-25 MED ORDER — ONDANSETRON 4 MG PO TBDP
4.0000 mg | ORAL_TABLET | Freq: Once | ORAL | Status: AC | PRN
  Administered 2017-07-25: 4 mg via ORAL
  Filled 2017-07-25: qty 1

## 2017-07-25 MED ORDER — FENTANYL CITRATE (PF) 100 MCG/2ML IJ SOLN
50.0000 ug | INTRAMUSCULAR | Status: DC | PRN
  Administered 2017-07-25: 50 ug via NASAL
  Filled 2017-07-25 (×2): qty 2

## 2017-07-25 MED ORDER — HYDROMORPHONE HCL 1 MG/ML IJ SOLN
1.0000 mg | Freq: Once | INTRAMUSCULAR | Status: AC
  Administered 2017-07-25: 1 mg via INTRAVENOUS
  Filled 2017-07-25: qty 1

## 2017-07-25 MED ORDER — SODIUM CHLORIDE 0.9 % IV BOLUS (SEPSIS)
1000.0000 mL | Freq: Once | INTRAVENOUS | Status: AC
  Administered 2017-07-25: 1000 mL via INTRAVENOUS

## 2017-07-25 NOTE — ED Triage Notes (Signed)
Pt c/o left flank pain, nausea onset today. No hematuria. Currently has bilateral renal calculi. Major history of recurrent kidney stones.

## 2017-07-25 NOTE — Discharge Instructions (Signed)
You were seen here today for flank pain. You were given fluid, nausea medication, and pain medication. A CT was ordered to evaluate your pain however after passing of a stone and resolution of your pain you requested to cancel the CT and return home. I will have you follow up with your urologist or PCP in 1 week for re-check. I am prescirbing you Nausea medication to take as needed at home. If you develop worsening or new concerning symptoms you can return to the emergency department for re-evaluation.

## 2017-07-25 NOTE — ED Provider Notes (Signed)
[facility]-EMERGENCY DEPT Provider Note   CSN: [id number] Arrival date & time: 07/25/17  1119     History   Chief Complaint Chief Complaint  Patient presents with  . Flank Pain    HPI Thomas Howell is a 26 y.o. male with a history of recurrent kidney stones who presents to the emergency department today for left flank pain. The patient states that over the last 3 weeks she has had colicky intermittent left flank pain that he rates as 3/10. He states that when he woke up this morning the pain worsened to a 9/10, is now constant, characterized as as sharp pain and is now associated with nausea. No radiation of pain into abdomen or groin or any other location. No vomiting. Patient states that this is exactly how his kidney stones presented in the past. The patient has tried Motrin and Flomax for this without any relief. 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 No gross hematuria. The patient denies fever, testicular pain, testicular swelling, purulent discharge, abdominal pain, urgency, urinary frequency, dysuria. The patient last bowel movement was this morning and normal.  HPI  Past Medical History:  Diagnosis Date  . Complication of anesthesia   . History of kidney stones   . Left ureteral stone   . Migraine   . PONV (postoperative nausea and vomiting)   . Renal calculi    bilateral per ct 07-26-2016  . Scoliosis of lumbar spine 1994   treated at [facility] until age 89  . Wears glasses     Patient Active Problem List   Diagnosis Date Noted  . Chronic migraine 02/14/2017  . Neck pain 02/04/2016  . Hydronephrosis, left 08/19/2015  . Hydronephrosis determined by ultrasound   . Kidney stone on left side 08/18/2015  . Nephrolithiasis 08/18/2015  . Kidney stone 07/06/2015  . Left ureteral stone 07/06/2015  . Analgesic rebound headache 06/05/2014  . Migraine headache without aura 05/30/2014    Past Surgical  History:  Procedure Laterality Date  . CYSTOSCOPY W/ RETROGRADES Right 07/06/2015   Procedure: CYSTOSCOPY WITH RETROGRADE PYELOGRAM;  Surgeon: Jerilee Field, MD;  Location: [facility];  Service: Urology;  Laterality: Right;  . CYSTOSCOPY W/ URETERAL STENT PLACEMENT Left 08/08/2015   Procedure: CYSTOSCOPY WITH STENT REPLACEMENT;  Surgeon: Jerilee Field, MD;  Location: [facility];  Service: Urology;  Laterality: Left;  . CYSTOSCOPY W/ URETERAL STENT PLACEMENT Left 02/21/2017   Procedure: CYSTOSCOPY WITH RETROGRADE PYELOGRAM/URETERAL LEFT STENT PLACEMENT WITH  LASER;  Surgeon: Bjorn Pippin, MD;  Location: [facility];  Service: Urology;  Laterality: Left;  . CYSTOSCOPY WITH RETROGRADE PYELOGRAM, URETEROSCOPY AND STENT PLACEMENT Left 06/24/2014   Procedure: CYSTOSCOPY WITH RETROGRADE PYELOGRAM,  AND STENT PLACEMENT;  Surgeon: Magdalene Molly, MD;  Location: [facility];  Service: Urology;  Laterality: Left;  . CYSTOSCOPY WITH RETROGRADE PYELOGRAM, URETEROSCOPY AND STENT PLACEMENT Left 07/05/2014   Procedure: CYSTO/LEFT URETEROSCOPY/LEFT RETROGRADE PYELOGRAM/LEFT STENT PLACEMENT;  Surgeon: Jerilee Field, MD;  Location: [facility];  Service: Urology;  Laterality: Left;  . CYSTOSCOPY WITH RETROGRADE PYELOGRAM, URETEROSCOPY AND STENT PLACEMENT Left 07/06/2015   Procedure: CYSTOSCOPY WITH RETROGRADE PYELOGRAM, URETEROSCOPY , LASER, STENT PLACEMENT and BASKET EXTRACTION;  Surgeon: Jerilee Field, MD;  Location: [facility];  Service: Urology;  Laterality: Left;  . CYSTOSCOPY WITH RETROGRADE PYELOGRAM, URETEROSCOPY AND STENT PLACEMENT Left 08/19/2015   Procedure: CYSTOSCOPY WITH RETROGRADE PYELOGRAM, URETEROSCOPY AND STENT PLACEMENT;  Surgeon: Jerilee Field, MD;  Location: [facility];  Service: Urology;  Laterality: Left;  . CYSTOSCOPY WITH URETEROSCOPY AND STENT PLACEMENT Left 01/16/2016   Procedure: CYSTOSCOPY WITH LEFT RETROGRADE PYELOGRAM  LEFT DIGITAL URETEROSCOPY AND PLACEMENT LEFT URETERAL  STENT;  Surgeon: Jerilee Field, MD;  Location: [facility];  Service: Urology;  Laterality: Left;  . CYSTOSCOPY WITH URETEROSCOPY, STONE BASKETRY AND STENT PLACEMENT Left 02/13/2016   Procedure: CYSTOSCOPY WITH LEFT URETEROSCOPY, HOLMIUM LASER AND STENT PLACEMENT;  Surgeon: Jerilee Field, MD;  Location: [facility];  Service: Urology;  Laterality: Left;  . CYSTOSCOPY/RETROGRADE/URETEROSCOPY/STONE EXTRACTION WITH BASKET Left 08/08/2015   Procedure: CYSTOSCOPY/RETROGRADE/URETEROSCOPY/STONE EXTRACTION WITH BASKET;  Surgeon: Jerilee Field, MD;  Location: [facility];  Service: Urology;  Laterality: Left;  . CYSTOSCOPY/URETEROSCOPY/HOLMIUM LASER/STENT PLACEMENT Left 07/30/2016   Procedure: CYSTOSCOPY/URETEROSCOPY/HOLMIUM LASER/STENT PLACEMENT;  Surgeon: Jerilee Field, MD;  Location: [facility];  Service: Urology;  Laterality: Left;  . EXTRACORPOREAL SHOCK WAVE LITHOTRIPSY Left 07-07-2015  &  12-26-2014  . 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 HOLMIUM LASER APPLICATION Left 07/05/2014   Procedure: LASER LITHO;  Surgeon: Jerilee Field, MD;  Location: [facility];  Service: Urology;  Laterality: Left;  . HOLMIUM LASER APPLICATION Left 08/08/2015   Procedure: HOLMIUM LASER  WITH LITHOTRIPSY ;  Surgeon: Jerilee Field, MD;  Location: [facility];  Service: Urology;  Laterality: Left;  . HOLMIUM LASER APPLICATION Left 02/21/2017   Procedure: HOLMIUM LASER APPLICATION;  Surgeon: Bjorn Pippin, MD;  Location: [facility];  Service: Urology;  Laterality: Left;  . LYMPH GLAND EXCISION  2003   neck--  benign       Home Medications    Prior to Admission medications   Medication Sig Start Date End Date Taking? Authorizing Provider  BOTOX 100 units SOLR injection INJECT IN THE MUSCLE TO HEAD AND NECK MUSCLES EVERY 3 MONTHS BY PROVIDER 06/06/17  Yes Sater, Pearletha Furl, MD  lamoTRIgine  (LAMICTAL) 100 MG tablet Take 1 tablet (100 mg total) by mouth daily. Take 1/2 po qd x 5 days, then 1/2 po bid x 5 d, then 1/2 po tid x 5d then 1 po bid Patient taking differently: Take 100 mg by mouth 2 (two) times daily.  10/18/16  Yes Sater, Pearletha Furl, MD  levETIRAcetam (KEPPRA) 750 MG tablet TAKE 1 TABLET BY MOUTH EVERY MORNING AND 2 TABLETS EVERY NIGHT AT BEDTIME 04/12/17  Yes Sater, Pearletha Furl, MD  naproxen sodium (ANAPROX) 220 MG tablet Take 440 mg by mouth 2 (two) times daily with a meal.   Yes [provider]  rizatriptan (MAXALT-MLT) 10 MG disintegrating tablet Take 1 tablet (10 mg total) by mouth as needed for migraine. May repeat in 2 hours if needed; max 2 per 24 hrs 10/18/16  Yes Sater, Pearletha Furl, MD  tamsulosin (FLOMAX) 0.4 MG CAPS capsule Take 0.4 mg by mouth daily as needed (kidney stones).   Yes [provider]  ondansetron (ZOFRAN) 4 MG tablet Take 1 tablet (4 mg total) by mouth every 8 (eight) hours as needed for nausea or vomiting. 07/25/17   Saree Krogh, Elmer Sow, PA-C    Family History Family History  Problem Relation Age of Onset  . Cancer Father   . Kidney Stones Mother   . Cancer Other   . Hypertension Other   . Hyperlipidemia Other   . Stroke Other   . Kidney Stones Brother  Social History Social History  Substance Use Topics  . Smoking status: Former Smoker    Years: 2.00    Quit date: 07/29/2014  . Smokeless tobacco: Never Used     Comment: social smoker  . Alcohol use 0.0 oz/week     Comment: occ     Allergies   Codeine and Adhesive [tape]   Review of Systems Review of Systems  All other systems reviewed and are negative.    Physical Exam Updated Vital Signs BP 123/86 (BP Location: Right Arm)   Pulse (!) 106   Temp 98.2 F (36.8 C) (Oral)   Resp 16   SpO2 99%   Physical Exam  Constitutional: He appears well-developed and well-nourished.  Nontoxic-appearing.  HENT:  Head: Normocephalic and atraumatic.  Right Ear:  External ear normal.  Left Ear: External ear normal.  Nose: Nose normal.  Mouth/Throat: Oropharynx is clear and moist.  Eyes: Pupils are equal, round, and reactive to light. Right eye exhibits no discharge. Left eye exhibits no discharge. No scleral icterus.  Neck: Neck supple.  Cardiovascular: Normal rate, regular rhythm and intact distal pulses.   No murmur heard. Pulses:      Radial pulses are 2+ on the right side, and 2+ on the left side.       Dorsalis pedis pulses are 2+ on the right side, and 2+ on the left side.       Posterior tibial pulses are 2+ on the right side, and 2+ on the left side.  No lower extremity swelling or edema. Calves symmetric in size bilaterally.  Pulmonary/Chest: Effort normal and breath sounds normal. He exhibits no tenderness.  Abdominal: Soft. Bowel sounds are normal. There is no tenderness. There is CVA tenderness. There is no rebound and no guarding (left).  Musculoskeletal: He exhibits no edema.  Lymphadenopathy:    He has no cervical adenopathy.  Neurological: He is alert.  Skin: Skin is warm, dry and intact. No ecchymosis and no rash noted. He is not diaphoretic.  Psychiatric: He has a normal mood and affect.  Nursing note and vitals reviewed.    ED Treatments / Results  Labs (all labs ordered are listed, but only abnormal results are displayed) Labs Reviewed  URINALYSIS, ROUTINE W REFLEX MICROSCOPIC - Abnormal; Notable for the following:       Result Value   Hgb urine dipstick SMALL (*)    Leukocytes, UA SMALL (*)    Bacteria, UA RARE (*)    All other components within normal limits  CBC WITH DIFFERENTIAL/PLATELET - Abnormal; Notable for the following:    WBC 10.9 (*)    All other components within normal limits  BASIC METABOLIC PANEL    EKG  EKG Interpretation None       Radiology No results found.  Procedures Procedures (including critical care time)  Medications Ordered in ED Medications  fentaNYL (SUBLIMAZE) injection  50 mcg (50 mcg Nasal Given 07/25/17 1219)  ondansetron (ZOFRAN-ODT) disintegrating tablet 4 mg (4 mg Oral Given 07/25/17 1220)  sodium chloride 0.9 % bolus 1,000 mL (0 mLs Intravenous Stopped 07/25/17 1622)  HYDROmorphone (DILAUDID) injection 1 mg (1 mg Intravenous Given 07/25/17 1449)  ketorolac (TORADOL) 30 MG/ML injection 30 mg (30 mg Intravenous Given 07/25/17 1447)     Initial Impression / Assessment and Plan / ED Course  I have reviewed the triage vital signs and the nursing notes.  Pertinent labs & imaging results that were available during my care of the patient were  reviewed by me and considered in my medical decision making (see chart for details).     26 year old male presenting with left flank pain. Patient with recurrent kidney stones and states this is similar. Patient is non-toxic appearing and afebrile on presentation. Patient does have CVA tenderness on exam. No abdominal, urinary, testicular or penile complaints.  Labs and Ct scan ordered to evaluate. Patient given fluids, pain medication and nausea medication.   Patient passed stone while in the ED. Collected in cup. Patient states after passage he has resolution of symptoms, would no longer like to have CT and would like to go home. CBC does show mild elevated WBC at 10.9 but do not suspect due to infection. No shift. Labs otherwise reassuring. UA with small Hgb. Pain and nausea controlled in the ED.  Pt will be dc home with pain medications & has been advised to follow up with PCP. I advised the patient to return to the emergency department with new or worsening symptoms or new concerns. Specific return precautions discussed. The patient verbalized understanding and agreement with plan. All questions answered. No further questions at this time. The patient is hemodynamically stable, mentating appropriately and appears safe for discharge.  Patient case discussed with Dr. Fayrene Fearing who is in agreement with plan.  Final Clinical  Impressions(s) / ED Diagnoses   Final diagnoses:  Kidney stone    New Prescriptions New Prescriptions   ONDANSETRON (ZOFRAN) 4 MG TABLET    Take 1 tablet (4 mg total) by mouth every 8 (eight) hours as needed for nausea or vomiting.     Princella Pellegrini 07/25/17 1629    Rolland Porter, MD 07/28/17 1037

## 2017-07-26 IMAGING — US US RENAL
1 series · 14 of 25 positions shown · non-contrast
Comparison: None.

CLINICAL DATA: Kidney stone.  Left flank pain last night.

EXAM:
RENAL / URINARY TRACT ULTRASOUND COMPLETE

[Series 1: us renal · 0.23mm/px · 14 of 41 slices shown]
[im 1/41]
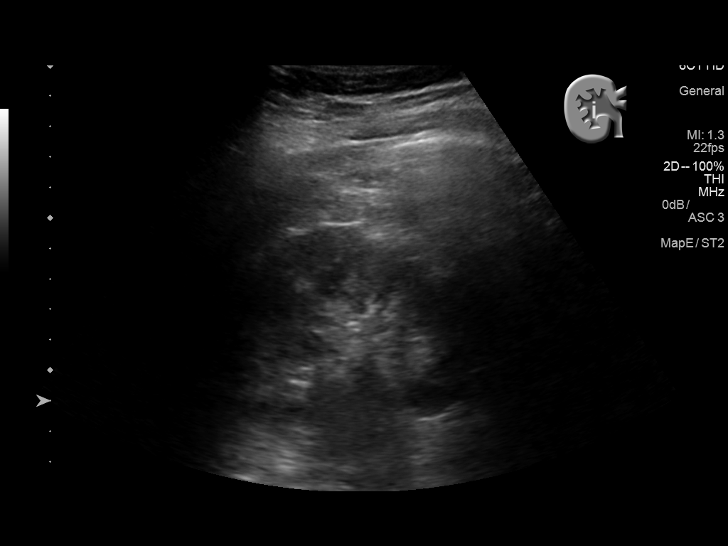
[im 4/41]
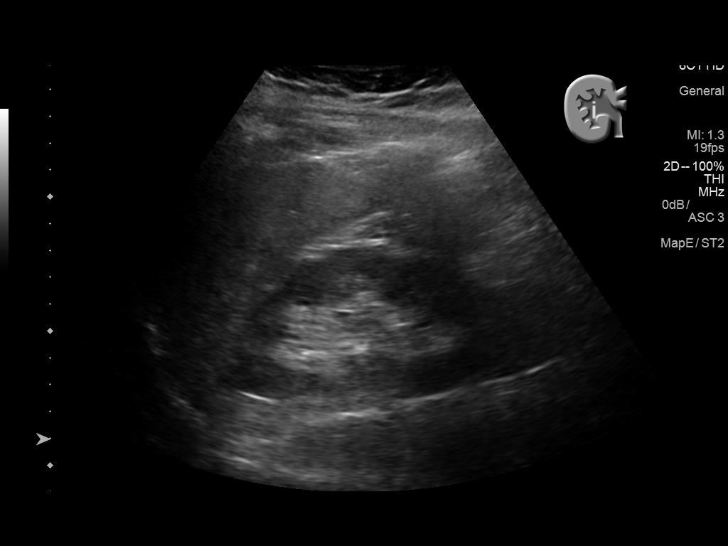
[im 7/41]
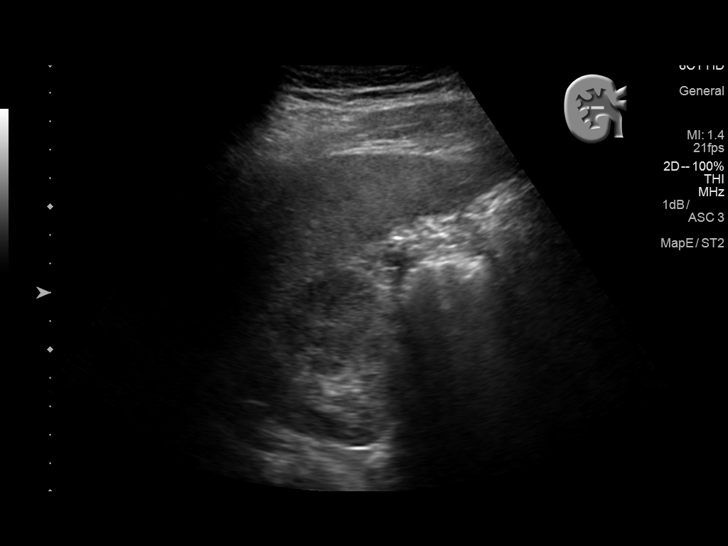
[im 11/41]
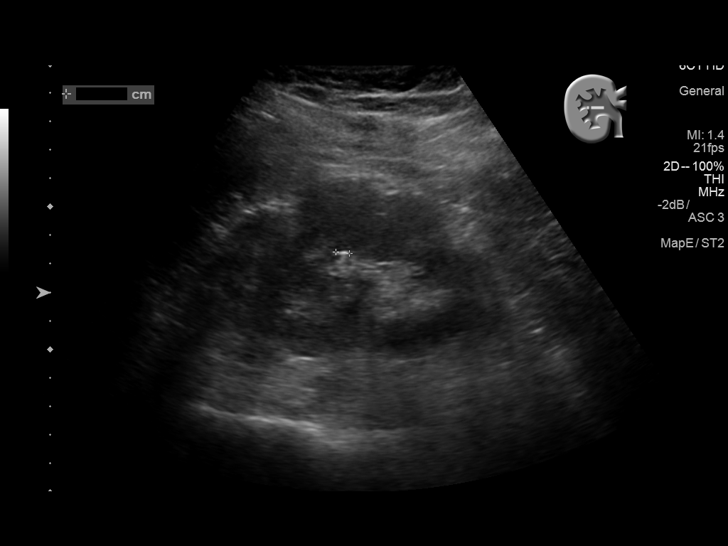
[im 14/41]
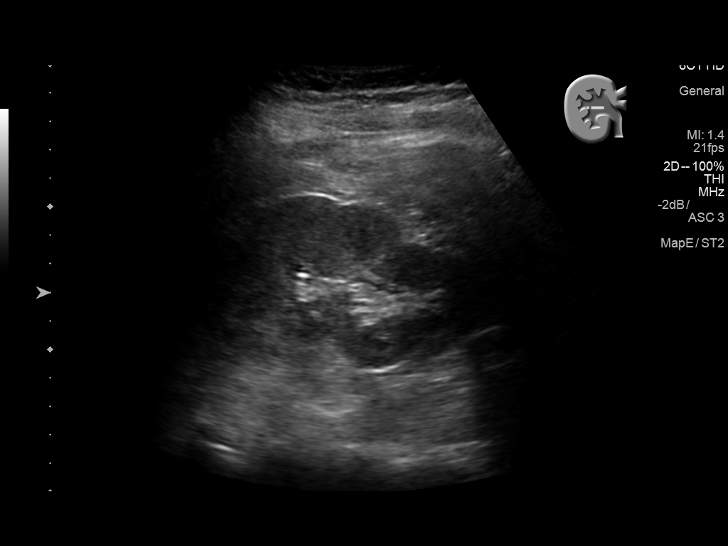
[im 16/41]
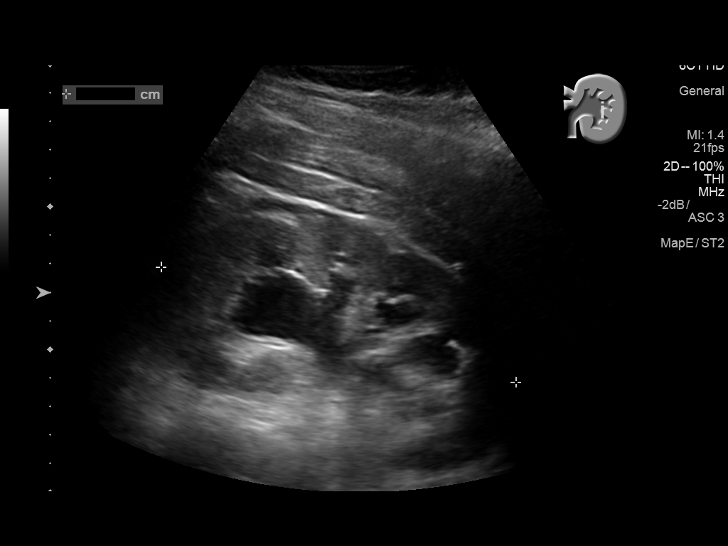
[im 19/41]
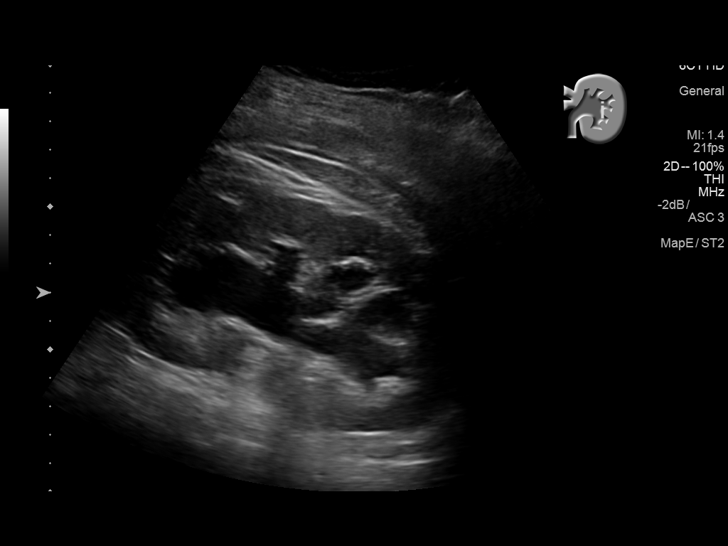
[im 22/41]
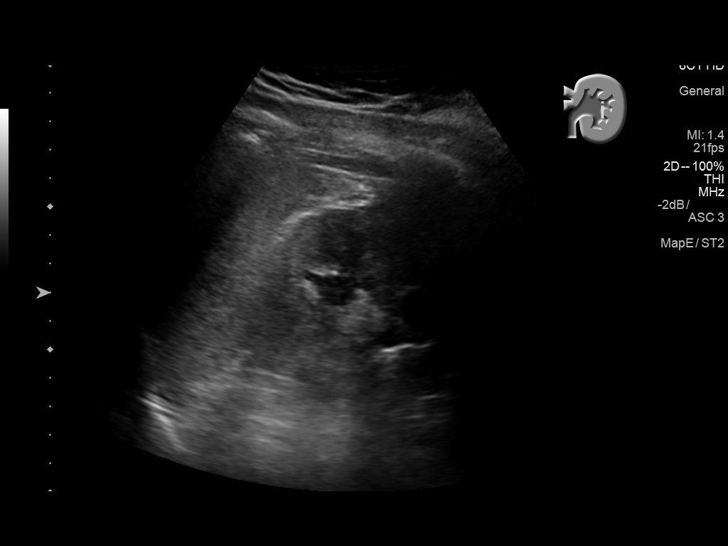
[im 26/41]
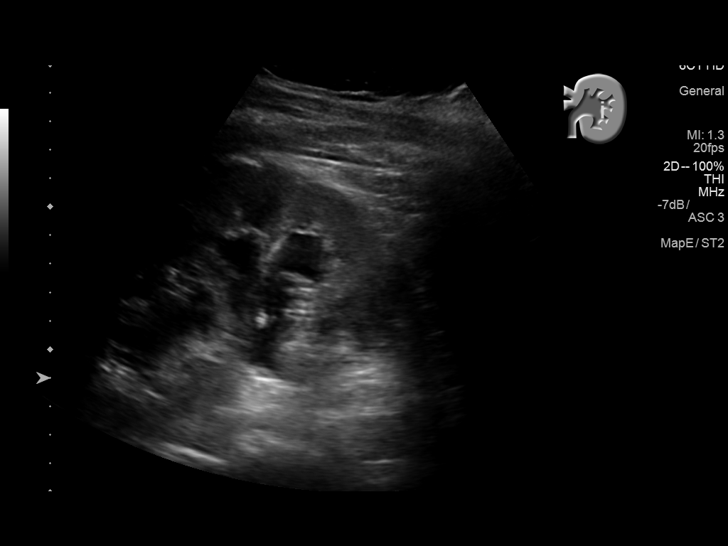
[im 27/41]
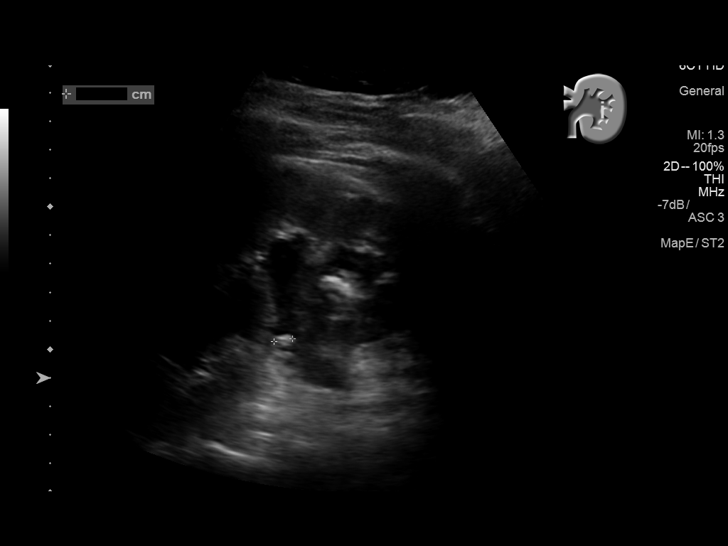
[im 31/41]
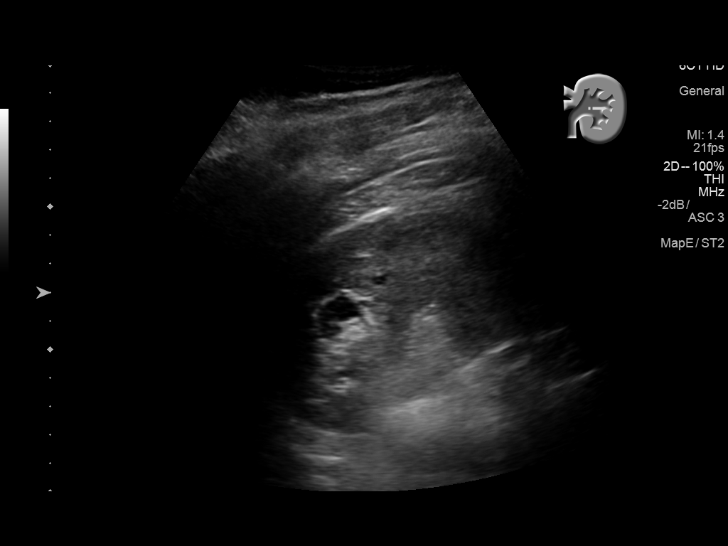
[im 34/41]
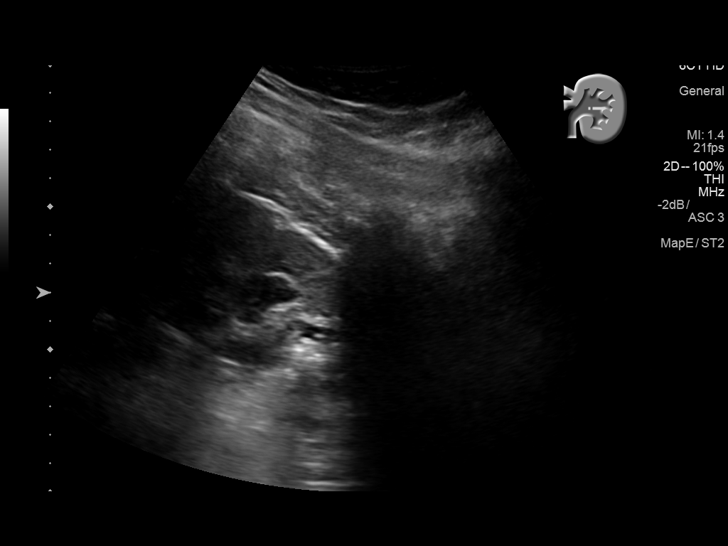
[im 37/41]
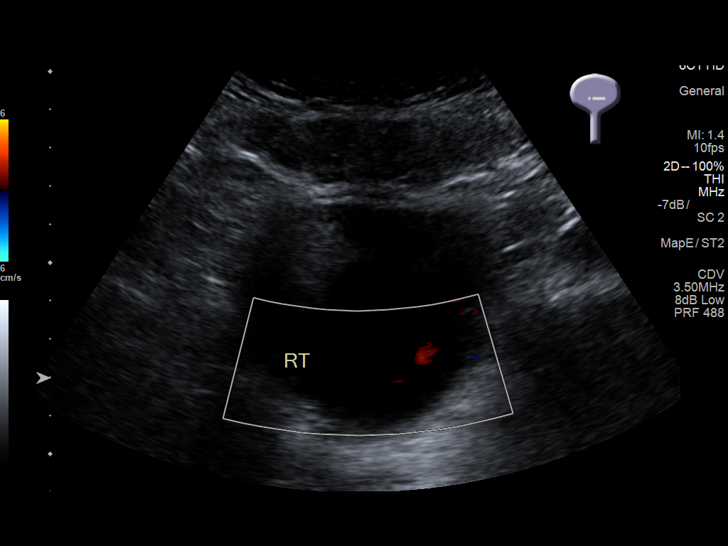
[im 41/41]
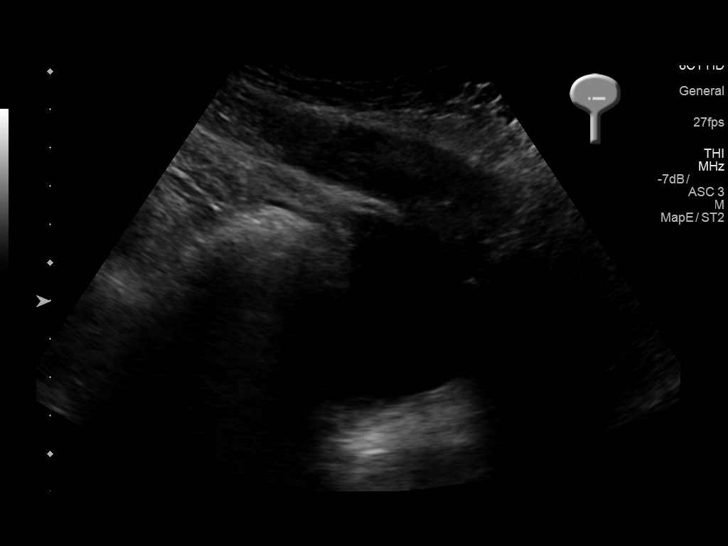

[14 of 25 positions shown; findings below may reference images not displayed]

FINDINGS: Right Kidney:

Length: 12.1 cm. 5 mm linear echogenic focus within the right renal
pelvis, likely small stone. No associated hydronephrosis.

Left Kidney:

Length: 13.1 cm. Moderate hydronephrosis. Probable 7 mm left renal
stone appear

Bladder:

Appears normal for degree of bladder distention. Distal right ureter
is shown to be patent at the level of the bladder (right ureteral
jet visualized). No left ureteral jet visualized.
IMPRESSION: 1. Moderate left-sided hydronephrosis. Suspect obstructing stone
within the left ureter. Consider CT for confirmation.
2. Bilateral nephrolithiasis.

## 2017-08-08 ENCOUNTER — Emergency Department (HOSPITAL_COMMUNITY): Payer: BLUE CROSS/BLUE SHIELD

## 2017-08-08 ENCOUNTER — Other Ambulatory Visit: Payer: Self-pay | Admitting: Neurology

## 2017-08-08 ENCOUNTER — Emergency Department (HOSPITAL_COMMUNITY)
Admission: EM | Admit: 2017-08-08 | Discharge: 2017-08-09 | Disposition: A | Discharge status: Home / Self Care | Payer: BLUE CROSS/BLUE SHIELD | Attending: Emergency Medicine | Admitting: Emergency Medicine

## 2017-08-08 ENCOUNTER — Encounter (HOSPITAL_COMMUNITY): Payer: Self-pay | Admitting: Emergency Medicine

## 2017-08-08 DIAGNOSIS — R109 Unspecified abdominal pain: Secondary | ICD-10-CM

## 2017-08-08 DIAGNOSIS — Z960 Presence of urogenital implants: Secondary | ICD-10-CM | POA: Insufficient documentation

## 2017-08-08 DIAGNOSIS — Z87891 Personal history of nicotine dependence: Secondary | ICD-10-CM | POA: Diagnosis not present

## 2017-08-08 DIAGNOSIS — N200 Calculus of kidney: Secondary | ICD-10-CM

## 2017-08-08 DIAGNOSIS — Z79899 Other long term (current) drug therapy: Secondary | ICD-10-CM | POA: Insufficient documentation

## 2017-08-08 DIAGNOSIS — N132 Hydronephrosis with renal and ureteral calculous obstruction: Secondary | ICD-10-CM | POA: Diagnosis not present

## 2017-08-08 LAB — URINALYSIS, ROUTINE W REFLEX MICROSCOPIC
Bilirubin Urine: NEGATIVE
Glucose, UA: NEGATIVE mg/dL
Ketones, ur: NEGATIVE mg/dL
Leukocytes, UA: NEGATIVE
Nitrite: NEGATIVE
Protein, ur: NEGATIVE mg/dL
Specific Gravity, Urine: 1.02 (ref 1.005–1.030)
Squamous Epithelial / LPF: NONE SEEN
pH: 6 (ref 5.0–8.0)

## 2017-08-08 MED ORDER — HYDROMORPHONE HCL 1 MG/ML IJ SOLN
1.0000 mg | Freq: Once | INTRAMUSCULAR | Status: AC
Start: 2017-08-08 — End: 2017-08-08
  Administered 2017-08-08: 1 mg via INTRAVENOUS
  Filled 2017-08-08: qty 1

## 2017-08-08 MED ORDER — KETOROLAC TROMETHAMINE 30 MG/ML IJ SOLN
30.0000 mg | Freq: Once | INTRAMUSCULAR | Status: AC
  Administered 2017-08-08: 30 mg via INTRAVENOUS
  Filled 2017-08-08: qty 1

## 2017-08-08 MED ORDER — ONDANSETRON HCL 4 MG/2ML IJ SOLN
4.0000 mg | Freq: Once | INTRAMUSCULAR | Status: AC
  Administered 2017-08-08: 4 mg via INTRAVENOUS
  Filled 2017-08-08: qty 2

## 2017-08-08 MED ORDER — HYDROMORPHONE HCL 1 MG/ML IJ SOLN
1.0000 mg | Freq: Once | INTRAMUSCULAR | Status: AC
  Administered 2017-08-08: 1 mg via INTRAVENOUS
  Filled 2017-08-08: qty 1

## 2017-08-08 NOTE — ED Provider Notes (Signed)
[facility]Y DEPT Provider Note   CSN: [id number] Arrival date & time: 08/08/17  1840     History   Chief Complaint Chief Complaint  Patient presents with  . Flank Pain    HPI JAMARIAN JACINTO is a 26 y.o. male.  HPI  LAYMOND POSTLE is a 26 y.o. male who presents to the Emergency Department complaining of left flank pain of sudden onset last evening.  Describes the pain as sharp and occasionally radiating to the left lower abdomen.  He reports hx of frequent kidney stones and pain feels similar to previous stones.  Pain has been associated with nausea and vomiting today.  He states he has been urinating regularly today and denies hematuria.  He was seen at [facility] on 07/25/17 for same and states that he passed a stone during his ED stay and pain did not return until last evening.  He denies fever, chills, and abdominal pain    Past Medical History:  Diagnosis Date  . Complication of anesthesia   . History of kidney stones   . Left ureteral stone   . Migraine   . PONV (postoperative nausea and vomiting)   . Renal calculi    bilateral per ct 07-26-2016  . Scoliosis of lumbar spine 1994   treated at [facility] until age 44  . Wears glasses     Patient Active Problem List   Diagnosis Date Noted  . Chronic migraine 02/14/2017  . Neck pain 02/04/2016  . Hydronephrosis, left 08/19/2015  . Hydronephrosis determined by ultrasound   . Kidney stone on left side 08/18/2015  . Nephrolithiasis 08/18/2015  . Kidney stone 07/06/2015  . Left ureteral stone 07/06/2015  . Analgesic rebound headache 06/05/2014  . Migraine headache without aura 05/30/2014    Past Surgical History:  Procedure Laterality Date  . CYSTOSCOPY W/ RETROGRADES Right 07/06/2015   Procedure: CYSTOSCOPY WITH RETROGRADE PYELOGRAM;  Surgeon: Jerilee Field, MD;  Location: [facility];  Service: Urology;  Laterality: Right;  . CYSTOSCOPY W/ URETERAL STENT PLACEMENT Left 08/08/2015   Procedure: CYSTOSCOPY WITH  STENT REPLACEMENT;  Surgeon: Jerilee Field, MD;  Location: [facility];  Service: Urology;  Laterality: Left;  . CYSTOSCOPY W/ URETERAL STENT PLACEMENT Left 02/21/2017   Procedure: CYSTOSCOPY WITH RETROGRADE PYELOGRAM/URETERAL LEFT STENT PLACEMENT WITH  LASER;  Surgeon: Bjorn Pippin, MD;  Location: [facility];  Service: Urology;  Laterality: Left;  . CYSTOSCOPY WITH RETROGRADE PYELOGRAM, URETEROSCOPY AND STENT PLACEMENT Left 06/24/2014   Procedure: CYSTOSCOPY WITH RETROGRADE PYELOGRAM,  AND STENT PLACEMENT;  Surgeon: Magdalene Molly, MD;  Location: [facility];  Service: Urology;  Laterality: Left;  . CYSTOSCOPY WITH RETROGRADE PYELOGRAM, URETEROSCOPY AND STENT PLACEMENT Left 07/05/2014   Procedure: CYSTO/LEFT URETEROSCOPY/LEFT RETROGRADE PYELOGRAM/LEFT STENT PLACEMENT;  Surgeon: Jerilee Field, MD;  Location: [facility];  Service: Urology;  Laterality: Left;  . CYSTOSCOPY WITH RETROGRADE PYELOGRAM, URETEROSCOPY AND STENT PLACEMENT Left 07/06/2015   Procedure: CYSTOSCOPY WITH RETROGRADE PYELOGRAM, URETEROSCOPY , LASER, STENT PLACEMENT and BASKET EXTRACTION;  Surgeon: Jerilee Field, MD;  Location: [facility];  Service: Urology;  Laterality: Left;  . CYSTOSCOPY WITH RETROGRADE PYELOGRAM, URETEROSCOPY AND STENT PLACEMENT Left 08/19/2015   Procedure: CYSTOSCOPY WITH RETROGRADE PYELOGRAM, URETEROSCOPY AND STENT PLACEMENT;  Surgeon: Jerilee Field, MD;  Location: [facility];  Service: Urology;  Laterality: Left;  . CYSTOSCOPY WITH URETEROSCOPY AND STENT PLACEMENT Left 01/16/2016   Procedure: CYSTOSCOPY WITH LEFT RETROGRADE PYELOGRAM  LEFT DIGITAL URETEROSCOPY AND PLACEMENT LEFT URETERAL STENT;  Surgeon: Molli Hazard  Mena Goes, MD;  Location: [facility];  Service: Urology;  Laterality: Left;  . CYSTOSCOPY WITH URETEROSCOPY, STONE BASKETRY AND STENT PLACEMENT Left 02/13/2016   Procedure: CYSTOSCOPY WITH LEFT URETEROSCOPY, HOLMIUM LASER AND STENT PLACEMENT;  Surgeon: Jerilee Field, MD;  Location[facility];  Service: Urology;  Laterality: Left;  . CYSTOSCOPY/RETROGRADE/URETEROSCOPY/STONE EXTRACTION WITH BASKET Left 08/08/2015   Procedure: CYSTOSCOPY/RETROGRADE/URETEROSCOPY/STONE EXTRACTION WITH BASKET;  Surgeon: Jerilee Field, MD;  Location: [facility];  Service: Urology;  Laterality: Left;  . CYSTOSCOPY/URETEROSCOPY/HOLMIUM LASER/STENT PLACEMENT Left 07/30/2016   Procedure: CYSTOSCOPY/URETEROSCOPY/HOLMIUM LASER/STENT PLACEMENT;  Surgeon: Jerilee Field, MD;  Location: [facility];  Service: Urology;  Laterality: Left;  . EXTRACORPOREAL SHOCK WAVE LITHOTRIPSY Left 07-07-2015  &  12-26-2014  . 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 HOLMIUM LASER APPLICATION Left 07/05/2014   Procedure: LASER LITHO;  Surgeon: Jerilee Field, MD;  Location: [facility];  Service: Urology;  Laterality: Left;  . HOLMIUM LASER APPLICATION Left 08/08/2015   Procedure: HOLMIUM LASER  WITH LITHOTRIPSY ;  Surgeon: Jerilee Field, MD;  Location: [facility];  Service: Urology;  Laterality: Left;  . HOLMIUM LASER APPLICATION Left 02/21/2017   Procedure: HOLMIUM LASER APPLICATION;  Surgeon: Bjorn Pippin, MD;  Location: [facility];  Service: Urology;  Laterality: Left;  . LYMPH GLAND EXCISION  2003   neck--  benign       Home Medications    Prior to Admission medications   Medication Sig Start Date End Date Taking? Authorizing Provider  BOTOX 100 units SOLR injection INJECT IN THE MUSCLE TO HEAD AND NECK MUSCLES EVERY 3 MONTHS BY PROVIDER 06/06/17   Asa Lente, MD  lamoTRIgine (LAMICTAL) 100 MG tablet Take 1 tablet (100 mg total) by mouth daily. Take 1/2 po qd x 5 days, then 1/2 po bid x 5 d, then 1/2 po tid x 5d then 1 po bid Patient taking differently: Take 100 mg by mouth 2 (two) times daily.  10/18/16   Sater, Pearletha Furl, MD  levETIRAcetam (KEPPRA) 750 MG tablet TAKE 1 TABLET BY MOUTH  EVERY MORNING AND 2 TABLETS EVERY NIGHT AT BEDTIME 08/08/17   Sater, Pearletha Furl, MD  naproxen sodium (ANAPROX) 220 MG tablet Take 440 mg by mouth 2 (two) times daily with a meal.    [provider]  ondansetron (ZOFRAN) 4 MG tablet Take 1 tablet (4 mg total) by mouth every 8 (eight) hours as needed for nausea or vomiting. 07/25/17   Maczis, Elmer Sow, PA-C  rizatriptan (MAXALT-MLT) 10 MG disintegrating tablet Take 1 tablet (10 mg total) by mouth as needed for migraine. May repeat in 2 hours if needed; max 2 per 24 hrs 10/18/16   Sater, Pearletha Furl, MD  tamsulosin (FLOMAX) 0.4 MG CAPS capsule Take 0.4 mg by mouth daily as needed (kidney stones).    [provider]    Family History Family History  Problem Relation Age of Onset  . Cancer Father   . Kidney Stones Mother   . Cancer Other   . Hypertension Other   . Hyperlipidemia Other   . Stroke Other   . Kidney Stones Brother     Social History Social History  Substance Use Topics  . Smoking status: Former Smoker    Years: 2.00    Quit date: 07/29/2014  . Smokeless tobacco: Never Used     Comment: social smoker  . Alcohol use 0.0 oz/week  Comment: occ     Allergies   Codeine and Adhesive [tape]   Review of Systems Review of Systems  Constitutional: Negative for activity change, appetite change, chills and fever.  Respiratory: Negative for chest tightness and shortness of breath.   Gastrointestinal: Positive for nausea and vomiting. Negative for abdominal pain.  Genitourinary: Positive for flank pain. Negative for decreased urine volume, difficulty urinating, dysuria, frequency, hematuria, penile pain, penile swelling, scrotal swelling, testicular pain and urgency.  Musculoskeletal: Negative for back pain.  Skin: Negative for rash.  Neurological: Negative for dizziness, weakness and numbness.  Hematological: Negative for adenopathy.  Psychiatric/Behavioral: Negative for confusion.  All other systems reviewed  and are negative.    Physical Exam Updated Vital Signs BP (!) 135/92 (BP Location: Right Arm)   Pulse 83   Temp 97.8 F (36.6 C) (Oral)   Resp 18   Ht 5\' 8"  (1.727 m)   Wt 99.8 kg (220 lb)   SpO2 97%   BMI 33.45 kg/m   Physical Exam  Constitutional: He is oriented to person, place, and time. He appears well-developed and well-nourished.  uncomfortable appearing.    HENT:  Mouth/Throat: Oropharynx is clear and moist.  Cardiovascular: Normal rate and regular rhythm.   No murmur heard. Pulmonary/Chest: Effort normal and breath sounds normal. No respiratory distress.  Abdominal: Soft. He exhibits no distension and no mass. There is no tenderness. There is no guarding.  No CVA tenderness   Musculoskeletal: Normal range of motion.  Neurological: He is alert and oriented to person, place, and time.  Skin: Skin is warm. Capillary refill takes less than 2 seconds. No rash noted.  Psychiatric: He has a normal mood and affect.  Nursing note and vitals reviewed.    ED Treatments / Results  Labs (all labs ordered are listed, but only abnormal results are displayed) Labs Reviewed  URINALYSIS, ROUTINE W REFLEX MICROSCOPIC - Abnormal; Notable for the following:       Result Value   APPearance HAZY (*)    Hgb urine dipstick SMALL (*)    Bacteria, UA RARE (*)    All other components within normal limits    EKG  EKG Interpretation None       Radiology Ct Renal Stone Study  Result Date: 08/08/2017 CLINICAL DATA:  LEFT flank pain beginning last night. History of kidney stones and stent placement. EXAM: CT ABDOMEN AND PELVIS WITHOUT CONTRAST TECHNIQUE: Multidetector CT imaging of the abdomen and pelvis was performed following the standard protocol without IV contrast. COMPARISON:  Abdominal radiograph June 01, 2017 and CT abdomen and pelvis July 26, 2016 FINDINGS: LOWER CHEST: Lung bases are clear. The visualized heart size is normal. No pericardial effusion. HEPATOBILIARY:  Extremely hypodense liver compatible with steatosis, focal fatty sparing about the gallbladder fossa. Normal gallbladder. PANCREAS: Normal. SPLEEN: Normal. ADRENALS/URINARY TRACT: Kidneys are orthotopic, demonstrating normal size and morphology. Moderate LEFT hydronephrosis to the ureteral pelvic junction where a 6 mm calculus is present. Numerous LEFT nephrolithiasis measuring to 7 mm LEFT lower pole. Punctate RIGHT nephrolithiasis without hydronephrosis. LEFT perinephric fat stranding and trace effusion. Limited assessment for renal masses on this nonenhanced examination. Urinary bladder is partially distended and unremarkable. Normal adrenal glands. STOMACH/BOWEL: The stomach, small and large bowel are normal in course and caliber without inflammatory changes, sensitivity decreased by lack of enteric contrast. Mild colonic diverticulosis. Medial course of ascending colon, pelvic cecum. Punctate appendicolith, no acute inflammatory changes. VASCULAR/LYMPHATIC: Aortoiliac vessels are normal in course and caliber. No  lymphadenopathy by CT size criteria. REPRODUCTIVE: Normal. OTHER: No intraperitoneal free fluid or free air. MUSCULOSKELETAL: Non-acute.  Broad levoscoliosis. IMPRESSION: 1. 6 mm LEFT UPJ stone resulting in moderate LEFT hydronephrosis. 2. Bilateral nephrolithiasis, nonobstructing on the RIGHT. 3. Hepatic steatosis. Electronically Signed   By: Awilda Metro M.D.   On: 08/08/2017 23:33    Procedures Procedures (including critical care time)  Medications Ordered in ED Medications  HYDROmorphone (DILAUDID) injection 1 mg (not administered)  ondansetron (ZOFRAN) injection 4 mg (not administered)  ketorolac (TORADOL) 30 MG/ML injection 30 mg (not administered)     Initial Impression / Assessment and Plan / ED Course  I have reviewed the triage vital signs and the nursing notes.  Pertinent labs & imaging results that were available during my care of the patient were reviewed by me and  considered in my medical decision making (see chart for details).     Labs from 07/25/17 reviewed by me and considered in my MDM.  Kidney function wnml   CT showing 6mm stone at left UPJ with moderate hydro  Pt feeling better after medications.  Vitals normal.  Appears stable for d/c HX of frequent kidney stones.  Has Flomax at home.   Prefers to f/u with Dr. Mena Goes with [facility].    Final Clinical Impressions(s) / ED Diagnoses   Final diagnoses:  Kidney stone  Left flank pain    New Prescriptions New Prescriptions   No medications on file     Pauline Aus, Cordelia Poche 08/09/17 0112

## 2017-08-08 NOTE — ED Triage Notes (Signed)
Pt c/o left flank pain since last night.

## 2017-08-09 DIAGNOSIS — Z888 Allergy status to other drugs, medicaments and biological substances status: Secondary | ICD-10-CM

## 2017-08-09 DIAGNOSIS — Z87442 Personal history of urinary calculi: Secondary | ICD-10-CM

## 2017-08-09 DIAGNOSIS — N132 Hydronephrosis with renal and ureteral calculous obstruction: Secondary | ICD-10-CM | POA: Diagnosis not present

## 2017-08-09 DIAGNOSIS — F17210 Nicotine dependence, cigarettes, uncomplicated: Secondary | ICD-10-CM | POA: Diagnosis not present

## 2017-08-09 DIAGNOSIS — N368 Other specified disorders of urethra: Secondary | ICD-10-CM | POA: Diagnosis present

## 2017-08-09 DIAGNOSIS — N179 Acute kidney failure, unspecified: Secondary | ICD-10-CM | POA: Diagnosis present

## 2017-08-09 DIAGNOSIS — N202 Calculus of kidney with calculus of ureter: Secondary | ICD-10-CM | POA: Diagnosis not present

## 2017-08-09 DIAGNOSIS — Z885 Allergy status to narcotic agent status: Secondary | ICD-10-CM | POA: Diagnosis not present

## 2017-08-09 DIAGNOSIS — Z6833 Body mass index (BMI) 33.0-33.9, adult: Secondary | ICD-10-CM | POA: Diagnosis not present

## 2017-08-09 DIAGNOSIS — A419 Sepsis, unspecified organism: Principal | ICD-10-CM | POA: Diagnosis present

## 2017-08-09 DIAGNOSIS — R000 Tachycardia, unspecified: Secondary | ICD-10-CM | POA: Diagnosis not present

## 2017-08-09 DIAGNOSIS — N1330 Unspecified hydronephrosis: Secondary | ICD-10-CM | POA: Diagnosis not present

## 2017-08-09 DIAGNOSIS — N201 Calculus of ureter: Secondary | ICD-10-CM | POA: Diagnosis not present

## 2017-08-09 MED ORDER — OXYCODONE-ACETAMINOPHEN 5-325 MG PO TABS: 1.0000 | ORAL_TABLET | ORAL | 0 refills | Status: DC | PRN

## 2017-08-09 MED ORDER — HYDROMORPHONE HCL 1 MG/ML IJ SOLN
0.5000 mg | Freq: Once | INTRAMUSCULAR | Status: AC
  Administered 2017-08-09: 0.5 mg via INTRAVENOUS
  Filled 2017-08-09: qty 1

## 2017-08-09 MED ORDER — PROMETHAZINE HCL 25 MG PO TABS: 25.0000 mg | ORAL_TABLET | Freq: Four times a day (QID) | ORAL | 0 refills | Status: DC | PRN

## 2017-08-09 NOTE — Discharge Instructions (Signed)
Continue taking your flomax as directed.  Call your urologist tomorrow to arrange a follow-up appt.  Return to ER if needed.

## 2017-08-10 ENCOUNTER — Inpatient Hospital Stay (HOSPITAL_COMMUNITY)
Admission: AD | Admit: 2017-08-10 | Discharge: 2017-08-12 | DRG: 872 | Disposition: A | Discharge status: Home / Self Care | Payer: BLUE CROSS/BLUE SHIELD | Source: Ambulatory Visit | Attending: Urology | Admitting: Urology

## 2017-08-10 ENCOUNTER — Ambulatory Visit (HOSPITAL_COMMUNITY): Payer: BLUE CROSS/BLUE SHIELD

## 2017-08-10 ENCOUNTER — Encounter (HOSPITAL_COMMUNITY): Payer: Self-pay | Admitting: *Deleted

## 2017-08-10 ENCOUNTER — Other Ambulatory Visit: Payer: Self-pay | Admitting: Urology

## 2017-08-10 ENCOUNTER — Ambulatory Visit (HOSPITAL_COMMUNITY): Payer: BLUE CROSS/BLUE SHIELD | Admitting: Anesthesiology

## 2017-08-10 ENCOUNTER — Encounter (HOSPITAL_COMMUNITY)
Admission: AD | Disposition: A | Discharge status: Home / Self Care | Payer: Self-pay | Source: Ambulatory Visit | Attending: Urology

## 2017-08-10 DIAGNOSIS — N368 Other specified disorders of urethra: Secondary | ICD-10-CM | POA: Diagnosis present

## 2017-08-10 DIAGNOSIS — Z87442 Personal history of urinary calculi: Secondary | ICD-10-CM | POA: Diagnosis not present

## 2017-08-10 DIAGNOSIS — Z6833 Body mass index (BMI) 33.0-33.9, adult: Secondary | ICD-10-CM | POA: Diagnosis not present

## 2017-08-10 DIAGNOSIS — N179 Acute kidney failure, unspecified: Secondary | ICD-10-CM | POA: Diagnosis present

## 2017-08-10 DIAGNOSIS — Z885 Allergy status to narcotic agent status: Secondary | ICD-10-CM | POA: Diagnosis not present

## 2017-08-10 DIAGNOSIS — N1330 Unspecified hydronephrosis: Secondary | ICD-10-CM | POA: Diagnosis not present

## 2017-08-10 DIAGNOSIS — N132 Hydronephrosis with renal and ureteral calculous obstruction: Secondary | ICD-10-CM | POA: Diagnosis not present

## 2017-08-10 DIAGNOSIS — N200 Calculus of kidney: Secondary | ICD-10-CM

## 2017-08-10 DIAGNOSIS — A419 Sepsis, unspecified organism: Secondary | ICD-10-CM | POA: Diagnosis not present

## 2017-08-10 DIAGNOSIS — N202 Calculus of kidney with calculus of ureter: Secondary | ICD-10-CM | POA: Diagnosis not present

## 2017-08-10 DIAGNOSIS — F17210 Nicotine dependence, cigarettes, uncomplicated: Secondary | ICD-10-CM | POA: Diagnosis present

## 2017-08-10 DIAGNOSIS — Z888 Allergy status to other drugs, medicaments and biological substances status: Secondary | ICD-10-CM | POA: Diagnosis not present

## 2017-08-10 DIAGNOSIS — R000 Tachycardia, unspecified: Secondary | ICD-10-CM | POA: Diagnosis present

## 2017-08-10 DIAGNOSIS — N201 Calculus of ureter: Secondary | ICD-10-CM | POA: Diagnosis not present

## 2017-08-10 HISTORY — PX: CYSTOSCOPY W/ URETERAL STENT PLACEMENT: SHX1429

## 2017-08-10 LAB — MRSA PCR SCREENING: MRSA by PCR: NEGATIVE

## 2017-08-10 LAB — GRAM STAIN

## 2017-08-10 SURGERY — CYSTOSCOPY, WITH RETROGRADE PYELOGRAM AND URETERAL STENT INSERTION
Anesthesia: General | Site: Ureter | Laterality: Left

## 2017-08-10 MED ORDER — LAMOTRIGINE 100 MG PO TABS: 100.0000 mg | ORAL_TABLET | Freq: Two times a day (BID) | ORAL | Status: DC

## 2017-08-10 MED ORDER — OXYCODONE HCL 5 MG PO TABS
5.0000 mg | ORAL_TABLET | ORAL | Status: DC | PRN
  Administered 2017-08-11 – 2017-08-12 (×2): 5 mg via ORAL
  Filled 2017-08-10 (×2): qty 1

## 2017-08-10 MED ORDER — LAMOTRIGINE 100 MG PO TABS
100.0000 mg | ORAL_TABLET | Freq: Two times a day (BID) | ORAL | Status: DC
  Administered 2017-08-10 – 2017-08-11 (×3): 100 mg via ORAL
  Filled 2017-08-10 (×3): qty 1

## 2017-08-10 MED ORDER — LEVETIRACETAM 500 MG PO TABS: 1500.0000 mg | ORAL_TABLET | Freq: Two times a day (BID) | ORAL | Status: DC

## 2017-08-10 MED ORDER — HYDROMORPHONE HCL-NACL 0.5-0.9 MG/ML-% IV SOSY
0.5000 mg | PREFILLED_SYRINGE | INTRAVENOUS | Status: DC | PRN
  Administered 2017-08-10 – 2017-08-12 (×9): 1 mg via INTRAVENOUS
  Filled 2017-08-10 (×9): qty 2

## 2017-08-10 MED ORDER — OXYBUTYNIN CHLORIDE ER 5 MG PO TB24
10.0000 mg | ORAL_TABLET | Freq: Every day | ORAL | Status: DC
  Administered 2017-08-10 – 2017-08-11 (×2): 10 mg via ORAL
  Filled 2017-08-10 (×2): qty 2

## 2017-08-10 MED ORDER — ACETAMINOPHEN 10 MG/ML IV SOLN
INTRAVENOUS | Status: AC
  Filled 2017-08-10: qty 100

## 2017-08-10 MED ORDER — FENTANYL CITRATE (PF) 100 MCG/2ML IJ SOLN
INTRAMUSCULAR | Status: AC
  Filled 2017-08-10: qty 2

## 2017-08-10 MED ORDER — SUCCINYLCHOLINE CHLORIDE 200 MG/10ML IV SOSY
PREFILLED_SYRINGE | INTRAVENOUS | Status: DC | PRN
  Administered 2017-08-10: 120 mg via INTRAVENOUS

## 2017-08-10 MED ORDER — LEVETIRACETAM 500 MG PO TABS
1500.0000 mg | ORAL_TABLET | Freq: Two times a day (BID) | ORAL | Status: DC
  Administered 2017-08-10: 1500 mg via ORAL
  Administered 2017-08-11: 750 mg via ORAL
  Administered 2017-08-11: 1500 mg via ORAL
  Filled 2017-08-10 (×3): qty 3

## 2017-08-10 MED ORDER — CHLORHEXIDINE GLUCONATE CLOTH 2 % EX PADS
6.0000 | MEDICATED_PAD | Freq: Once | CUTANEOUS | Status: AC
  Administered 2017-08-10: 6 via TOPICAL

## 2017-08-10 MED ORDER — SODIUM CHLORIDE 0.9 % IR SOLN
Status: DC | PRN
  Administered 2017-08-10: 3000 mL

## 2017-08-10 MED ORDER — FENTANYL CITRATE (PF) 100 MCG/2ML IJ SOLN
INTRAMUSCULAR | Status: AC
  Administered 2017-08-10: 50 ug via INTRAVENOUS
  Filled 2017-08-10: qty 2

## 2017-08-10 MED ORDER — CHLORHEXIDINE GLUCONATE CLOTH 2 % EX PADS: 6.0000 | MEDICATED_PAD | Freq: Once | CUTANEOUS | Status: DC

## 2017-08-10 MED ORDER — DEXTROSE 5 % IV SOLN
2.0000 g | INTRAVENOUS | Status: DC
  Administered 2017-08-10 – 2017-08-12 (×2): 2 g via INTRAVENOUS
  Filled 2017-08-10 (×2): qty 2

## 2017-08-10 MED ORDER — DEXAMETHASONE SODIUM PHOSPHATE 10 MG/ML IJ SOLN
INTRAMUSCULAR | Status: DC | PRN
  Administered 2017-08-10: 10 mg via INTRAVENOUS

## 2017-08-10 MED ORDER — LIDOCAINE 2% (20 MG/ML) 5 ML SYRINGE
INTRAMUSCULAR | Status: AC
  Filled 2017-08-10: qty 5

## 2017-08-10 MED ORDER — PROPOFOL 10 MG/ML IV BOLUS
INTRAVENOUS | Status: AC
  Filled 2017-08-10: qty 20

## 2017-08-10 MED ORDER — ONDANSETRON HCL 4 MG/2ML IJ SOLN
INTRAMUSCULAR | Status: AC
  Filled 2017-08-10: qty 2

## 2017-08-10 MED ORDER — ACETAMINOPHEN 10 MG/ML IV SOLN
1000.0000 mg | Freq: Once | INTRAVENOUS | Status: AC
  Administered 2017-08-10: 1000 mg via INTRAVENOUS

## 2017-08-10 MED ORDER — DOCUSATE SODIUM 100 MG PO CAPS
100.0000 mg | ORAL_CAPSULE | Freq: Two times a day (BID) | ORAL | Status: DC
  Administered 2017-08-10 – 2017-08-11 (×3): 100 mg via ORAL
  Filled 2017-08-10 (×3): qty 1

## 2017-08-10 MED ORDER — ONDANSETRON HCL 4 MG/2ML IJ SOLN: 4.0000 mg | INTRAMUSCULAR | Status: DC | PRN

## 2017-08-10 MED ORDER — ONDANSETRON HCL 4 MG/2ML IJ SOLN
INTRAMUSCULAR | Status: DC | PRN
  Administered 2017-08-10: 4 mg via INTRAVENOUS

## 2017-08-10 MED ORDER — PROMETHAZINE HCL 25 MG PO TABS
25.0000 mg | ORAL_TABLET | Freq: Four times a day (QID) | ORAL | Status: DC | PRN
Start: 2017-08-10 — End: 2017-08-10

## 2017-08-10 MED ORDER — PROPOFOL 10 MG/ML IV BOLUS
INTRAVENOUS | Status: DC | PRN
  Administered 2017-08-10: 200 mg via INTRAVENOUS

## 2017-08-10 MED ORDER — MIDAZOLAM HCL 2 MG/2ML IJ SOLN
INTRAMUSCULAR | Status: AC
  Filled 2017-08-10: qty 2

## 2017-08-10 MED ORDER — TAMSULOSIN HCL 0.4 MG PO CAPS: 0.4000 mg | ORAL_CAPSULE | Freq: Every day | ORAL | Status: DC

## 2017-08-10 MED ORDER — SODIUM CHLORIDE 0.9 % IV SOLN
INTRAVENOUS | Status: DC
  Administered 2017-08-10: 1000 mL via INTRAVENOUS

## 2017-08-10 MED ORDER — IOHEXOL 300 MG/ML  SOLN
INTRAMUSCULAR | Status: DC | PRN
  Administered 2017-08-10: 10 mL via INTRAVENOUS

## 2017-08-10 MED ORDER — SENNOSIDES-DOCUSATE SODIUM 8.6-50 MG PO TABS
2.0000 | ORAL_TABLET | Freq: Every day | ORAL | Status: DC
  Administered 2017-08-10 – 2017-08-11 (×2): 2 via ORAL
  Filled 2017-08-10 (×2): qty 2

## 2017-08-10 MED ORDER — LACTATED RINGERS IV SOLN
INTRAVENOUS | Status: DC
  Administered 2017-08-10: 16:00:00 via INTRAVENOUS

## 2017-08-10 MED ORDER — LIDOCAINE 2% (20 MG/ML) 5 ML SYRINGE
INTRAMUSCULAR | Status: DC | PRN
  Administered 2017-08-10: 100 mg via INTRAVENOUS

## 2017-08-10 MED ORDER — FENTANYL CITRATE (PF) 100 MCG/2ML IJ SOLN
INTRAMUSCULAR | Status: DC | PRN
  Administered 2017-08-10 (×2): 50 ug via INTRAVENOUS

## 2017-08-10 MED ORDER — CEFAZOLIN SODIUM-DEXTROSE 2-4 GM/100ML-% IV SOLN
2.0000 g | INTRAVENOUS | Status: AC
  Administered 2017-08-10: 2 g via INTRAVENOUS
  Filled 2017-08-10: qty 100

## 2017-08-10 MED ORDER — MIDAZOLAM HCL 5 MG/5ML IJ SOLN
INTRAMUSCULAR | Status: DC | PRN
  Administered 2017-08-10: 2 mg via INTRAVENOUS

## 2017-08-10 MED ORDER — SUCCINYLCHOLINE CHLORIDE 200 MG/10ML IV SOSY
PREFILLED_SYRINGE | INTRAVENOUS | Status: AC
  Filled 2017-08-10: qty 10

## 2017-08-10 MED ORDER — ACETAMINOPHEN 500 MG PO TABS
1000.0000 mg | ORAL_TABLET | Freq: Four times a day (QID) | ORAL | Status: AC
  Administered 2017-08-10 – 2017-08-11 (×3): 1000 mg via ORAL
  Filled 2017-08-10 (×3): qty 2

## 2017-08-10 MED ORDER — FENTANYL CITRATE (PF) 100 MCG/2ML IJ SOLN
25.0000 ug | INTRAMUSCULAR | Status: DC | PRN
  Administered 2017-08-10 (×3): 50 ug via INTRAVENOUS

## 2017-08-10 MED ORDER — ONDANSETRON HCL 4 MG/2ML IJ SOLN
4.0000 mg | Freq: Once | INTRAMUSCULAR | Status: AC
  Administered 2017-08-10: 4 mg via INTRAVENOUS

## 2017-08-10 SURGICAL SUPPLY — 15 items
BAG URO CATCHER STRL LF (MISCELLANEOUS) ×2 IMPLANT
CATH FOLEY 2WAY SLVR 18FR 30CC (CATHETERS) ×2 IMPLANT
CATH INTERMIT  6FR 70CM (CATHETERS) ×2 IMPLANT
CLOTH BEACON ORANGE TIMEOUT ST (SAFETY) ×2 IMPLANT
COVER FOOT SWITCH UNIV DISP (DRAPES) IMPLANT
COVER SURGICAL LIGHT HANDLE (MISCELLANEOUS) IMPLANT
GLOVE BIO SURGEON STRL SZ 6.5 (GLOVE) ×4 IMPLANT
GLOVE BIOGEL M STRL SZ7.5 (GLOVE) ×2 IMPLANT
GOWN STRL REUS W/TWL LRG LVL3 (GOWN DISPOSABLE) ×4 IMPLANT
GUIDEWIRE STR DUAL SENSOR (WIRE) ×4 IMPLANT
MANIFOLD NEPTUNE II (INSTRUMENTS) ×2 IMPLANT
PACK CYSTO (CUSTOM PROCEDURE TRAY) ×2 IMPLANT
STENT URET 6FRX26 CONTOUR (STENTS) ×2 IMPLANT
SYRINGE 60CC LL (MISCELLANEOUS) ×2 IMPLANT
TUBING CONNECTING 10 (TUBING) ×2 IMPLANT

## 2017-08-10 NOTE — H&P (Signed)
Office Visit Report     08/10/2017   --------------------------------------------------------------------------------   Marliss Coots  MRN: [id number]  PRIMARY CARE:  Donna Bernard, MD  DOB: Nov 24, 1991, 26 year old Male  REFERRING:  Vanetta Mulders  SSN: [id number]  PROVIDER:  Jerilee Field, M.D.    TREATING:  Anne Fu    LOCATION:  [facility] [phone]   --------------------------------------------------------------------------------   CC/HPI: Thomas Howell has a long standing kidney stone history. He is a calcium phosphate stone former. He is being evaluated by nephrology in the past as well. Last stone that was this past spring in which he required ureteroscopy and stenting. Presents today after ER evaluation 2 days ago he was diagnosed with a left proximal ureteral calculi (5-52mm) with associated hydronephrosis. Since ER discharge, patient has had ongoing renal colic somewhat controlled with Percocet. He is currently taking tamsulosin. Associated LUTS include some increased hesitancy and urgency. He denies hematuria/dysuria. Tolerating some oral intake but does have persistent nausea. He last drank fluids around noon today. He is having intermittent fevers controlled with Motrin. Tachycardic on presentation today, tender to palpation on exam. UA is suspicious for infection.     ALLERGIES: Adhesive tape Codeine Derivatives Hycodan SYRP    MEDICATIONS: Percocet  Flomax  Keppra 1,000 mg tablet Oral  Lamictal 100 mg tablet 1 tablet PO BID  Maxalt Mlt 10 mg tablet,disintegrating 1 tablet PO TID PRN  Zofran     GU PSH: Cysto Remove Stent FB Sim - 03/01/2017 Cysto Uretero Lithotripsy - 08/19/2015, 2016, 2015 Cystoscopy Insert Stent - 2017, 08/20/2015, 08/19/2015, 2016, 2015, 2015 Cystoscopy Ureteroscopy - 2017, 08/20/2015 ESWL - 2016, 2016 Remove Pelvic Lymph Nodes - 2012 Ureteroscopic laser litho, Left - 02/21/2017, Left - 07/30/2016, 02/18/2016 Ureteroscopic  stone removal, Left - 02/21/2017      PSH Notes: Cystoscopy Ureteroscopy Lithotripsy Incl Insert Indwelling Ureter Stent, Cystoscopy With Insertion Of Ureteral Stent Left, Cystoscopy With Ureteroscopy Left, Cystoscopy With Ureteroscopy Left, Cystoscopy With Insertion Of Ureteral Stent Left, Cystoscopy With Insertion Of Ureteral Stent Left, Cystoscopy With Ureteroscopy With Lithotripsy, Cystoscopy With Ureteroscopy With Lithotripsy, Cystoscopy With Insertion Of Ureteral Stent Left, Lithotripsy, Lithotripsy, Cystoscopy With Ureteroscopy With Lithotripsy, Cystoscopy With Insertion Of Ureteral Stent Left, Cystoscopy With Insertion Of Ureteral Stent Left, Lymphadenectomy, Foot Surgery   NON-GU PSH: None   GU PMH: Renal calculus - 06/27/2017, - 03/01/2017, - 09/24/2016, - 07/29/2016, Kidney stone on left side, - 02/27/2016, Nephrolithiasis, - 2017 Ureteral calculus, 6mm Left proximal stone with pain and nausea. Left renal stones. I will give him toradol and phenergan and get him set up for Ureteroscopy and stent. he is aware of the risks. - 02/21/2017, - 07/29/2016, Calculus of left ureter, - 2017 Hydronephrosis Unspec, Hydronephrosis, left - 2017 Other microscopic hematuria, Microscopic hematuria - 2017 Gross hematuria, Gross hematuria - 2017    NON-GU PMH: Personal history of other diseases of the nervous system and sense organs, History of migraine headaches - 2017 Encounter for general adult medical examination without abnormal findings, Encounter for preventive health examination - 2017    FAMILY HISTORY: Blood In Urine - Mother, Brother Family Health Status Number - Runs In Family nephrolithiasis - Runs In Family   SOCIAL HISTORY: Marital Status: Single Preferred Language: English; Ethnicity: Not Hispanic Or Latino; Race: White Current Smoking Status: Patient has never smoked.  Has never drank.  Drinks 1 caffeinated drink per day. Patient's occupation Insurance underwriter.     Notes: Current smoker  on some days,  Alcohol Use, Marital History - Single, Tobacco Use, Caffeine Use, Occupation:   REVIEW OF SYSTEMS:    GU Review Male:   Patient reports trouble starting your stream and have to strain to urinate . Patient denies frequent urination, hard to postpone urination, burning/ pain with urination, get up at night to urinate, leakage of urine, stream starts and stops, erection problems, and penile pain.  Gastrointestinal (Upper):   Patient reports nausea and vomiting. Patient denies indigestion/ heartburn.  Gastrointestinal (Lower):   Patient denies diarrhea and constipation.  Constitutional:   Patient reports fever, night sweats, and fatigue. Patient denies weight loss.  Skin:   Patient denies skin rash/ lesion and itching.  Eyes:   Patient denies blurred vision and double vision.  Ears/ Nose/ Throat:   Patient denies sore throat and sinus problems.  Hematologic/Lymphatic:   Patient denies swollen glands and easy bruising.  Cardiovascular:   Patient denies leg swelling and chest pains.  Respiratory:   Patient denies cough and shortness of breath.  Endocrine:   Patient reports excessive thirst.   Musculoskeletal:   Patient reports back pain. Patient denies joint pain.  Neurological:   Patient reports headaches and dizziness.   Psychologic:   Patient denies depression and anxiety.   VITAL SIGNS:      08/10/2017 02:09 PM  Weight 220 lb / 99.79 kg  Height 68 in / 172.72 cm  BP 117/79 mmHg  Pulse 132 /min  Temperature 99.3 F / 37.3 C  BMI 33.4 kg/m   MULTI-SYSTEM PHYSICAL EXAMINATION:    Constitutional: Well-nourished. No physical deformities. Normally developed. Good grooming.  Respiratory: No labored breathing, no use of accessory muscles. CTA.  Cardiovascular: Normal temperature, normal extremity pulses, no swelling, no varicosities. tachycardic but no murmur, irregularity.   Skin: No paleness, no jaundice, no cyanosis. No lesion, no ulcer, no rash.  Neurologic / Psychiatric:  Oriented to time, oriented to place, oriented to person. No depression, no anxiety, no agitation.  Gastrointestinal: Obese abdomen. No mass, no tenderness, no rigidity. Palpable left flank tenderness.  Musculoskeletal: Spine, ribs, pelvis no bilateral tenderness. Normal gait and station of head and neck.     PAST DATA REVIEWED:  Source Of History:  Patient, Family/Caregiver  Records Review:   Previous Hospital Records, Previous Patient Records  Urine Test Review:   Urinalysis  X-Ray Review: C.T. Abdomen/Pelvis: Reviewed Films.     08/10/17  Urinalysis  Urine Appearance Cloudy   Urine Color Orange   Urine Glucose Neg   Urine Bilirubin Neg   Urine Ketones Trace   Urine Specific Gravity 1.025   Urine Blood 3+   Urine pH 6.0   Urine Protein 3+   Urine Urobilinogen 1.0   Urine Nitrites Neg   Urine Leukocyte Esterase 1+   Urine WBC/hpf >60/hpf   Urine RBC/hpf 20 - 40/hpf   Urine Epithelial Cells 0 - 5/hpf   Urine Bacteria Mod (26-50/hpf)   Urine Mucous Present   Urine Yeast NS (Not Seen)   Urine Trichomonas Not Present   Urine Cystals NS (Not Seen)   Urine Casts Hyaline   Urine Sperm Not Present    PROCEDURES:          Urinalysis w/Scope Dipstick Dipstick Cont'd Micro  Color: Orange Bilirubin: Neg WBC/hpf: >60/hpf  Appearance: Cloudy Ketones: Trace RBC/hpf: 20 - 40/hpf  Specific Gravity: 1.025 Blood: 3+ Bacteria: Mod (26-50/hpf)  pH: 6.0 Protein: 3+ Cystals: NS (Not Seen)  Glucose: Neg Urobilinogen: 1.0 Casts: Hyaline  Nitrites: Neg Trichomonas: Not Present    Leukocyte Esterase: 1+ Mucous: Present      Epithelial Cells: 0 - 5/hpf      Yeast: NS (Not Seen)      Sperm: Not Present         Ceftriaxone 1g - 44628, M3817 Qty: 1 Adm. By: Lyndal Rainbow  Unit: gram Lot No [id number]  Route: IM Exp. Date 09/19/2018  Freq: None Mfgr.:   Site: Left Buttock         Morphine 4mg  - [id number], J2270 Qty: 4 Adm. By: Lyndal Rainbow  Unit: mg Lot No [id number]  Route: IM Exp. Date 06/19/2018   Freq: None Mfgr.:   Site: Right Buttock         Phenergan 25mg  - [id number], J2550 Qty: 25 Adm. By: Lyndal Rainbow  Unit: mg Lot No [id number]  Route: IM Exp. Date 04/20/2019  Freq: None Mfgr.:   Site: Right Buttock   ASSESSMENT:      ICD-10 Details  1 GU:   Ureteral calculus - N20.1 Left  2   Hydronephrosis Unspec - N13.30 Left   PLAN:           Orders Labs Urine Culture          Schedule Return Visit/Planned Activity: ASAP - Schedule Surgery  Procedure: 08/10/2017 at [facility] - [phone] - Morphine 4mg  (Ther/Proph/Diag Inj, [place]/Im) - 16606, Y0459  Procedure: 08/10/2017 at [facility] - [phone] - Phenergan 25mg  (Phenergan Per 50 Mg) - J2550, [id number]  Procedure: 08/10/2017 at [facility] - [phone] - Ceftriaxone 1g (Injection, Ceftriaxone Sodium, Per 250 Mg) - V2023, 34356          Document Letter(s):  Created for Patient: Clinical Summary         Notes:   *Codeine allergy but pt has not had issues or reaction with morphine IM/IV.   Suspicious for developing infection from obstructing stone. Discussed with patient urologist as well as on call urologist, patient to have ureteral stenting this evening with possible overnight observation. He last drank fluids around noontime today. No oral intake of solid food since Monday. Risks and benefits of the procedure discussed in detail with the patient and his mother. Understanding expressed. Patient given morphine, Phenergan, and Rocephin IM in clinic this afternoon. Patient will follow up with Dr. Mena Goes for consideration of ureteroscopy versus lithotripsy to treat the ureteral calculi in question. Urinalysis sent for culture today as well.    * Signed by Anne Fu on 08/10/17 at 3:27 PM (EDT)*

## 2017-08-10 NOTE — Anesthesia Preprocedure Evaluation (Addendum)
Anesthesia Evaluation  Patient identified by MRN, date of birth, ID band Patient awake    Reviewed: Allergy & Precautions, H&P , NPO status , Patient's Chart, lab work & pertinent test results  History of Anesthesia Complications (+) PONV  Airway Mallampati: III  TM Distance: >3 FB Neck ROM: Full    Dental no notable dental hx. (+) Teeth Intact, Dental Advisory Given   Pulmonary neg pulmonary ROS, former smoker,    Pulmonary exam normal breath sounds clear to auscultation       Cardiovascular negative cardio ROS   Rhythm:Regular Rate:Normal     Neuro/Psych  Headaches, negative psych ROS   GI/Hepatic negative GI ROS, Neg liver ROS,   Endo/Other  negative endocrine ROS  Renal/GU Renal disease  negative genitourinary   Musculoskeletal   Abdominal   Peds  Hematology negative hematology ROS (+)   Anesthesia Other Findings   Reproductive/Obstetrics negative OB ROS                            Anesthesia Physical Anesthesia Plan  ASA: II  Anesthesia Plan: General   Post-op Pain Management:    Induction: Intravenous, Rapid sequence and Cricoid pressure planned  PONV Risk Score and Plan: 4 or greater and Ondansetron, Dexamethasone and Midazolam  Airway Management Planned: Oral ETT  Additional Equipment:   Intra-op Plan:   Post-operative Plan: Extubation in OR  Informed Consent: I have reviewed the patients History and Physical, chart, labs and discussed the procedure including the risks, benefits and alternatives for the proposed anesthesia with the patient or authorized representative who has indicated his/her understanding and acceptance.   Dental advisory given  Plan Discussed with: CRNA  Anesthesia Plan Comments:        Anesthesia Quick Evaluation

## 2017-08-10 NOTE — Anesthesia Procedure Notes (Signed)
Procedure Name: Intubation Date/Time: 08/10/2017 6:05 PM Performed by: Montel Clock Pre-anesthesia Checklist: Patient identified, Emergency Drugs available, Suction available and Patient being monitored Patient Re-evaluated:Patient Re-evaluated prior to induction Oxygen Delivery Method: Circle system utilized Preoxygenation: Pre-oxygenation with 100% oxygen Induction Type: IV induction, Rapid sequence and Cricoid Pressure applied Laryngoscope Size: Mac and 3 Grade View: Grade II Tube type: Oral Tube size: 7.5 mm Number of attempts: 1 Airway Equipment and Method: Stylet Placement Confirmation: ETT inserted through vocal cords under direct vision,  positive ETCO2 and breath sounds checked- equal and bilateral Secured at: 23 cm Tube secured with: Tape Dental Injury: Teeth and Oropharynx as per pre-operative assessment

## 2017-08-10 NOTE — Anesthesia Postprocedure Evaluation (Signed)
Anesthesia Post Note  Patient: Thomas Howell  Procedure(s) Performed: Procedure(s) (LRB): CYSTOSCOPY WITH RETROGRADE PYELOGRAM/URETERAL STENT PLACEMENT (Left)     Patient location during evaluation: PACU Anesthesia Type: General Level of consciousness: awake and alert Pain management: pain level controlled Vital Signs Assessment: post-procedure vital signs reviewed and stable Respiratory status: spontaneous breathing, nonlabored ventilation, respiratory function stable and patient connected to nasal cannula oxygen Cardiovascular status: blood pressure returned to baseline and stable Postop Assessment: no signs of nausea or vomiting Anesthetic complications: no    Last Vitals:  Vitals:   08/10/17 1915 08/10/17 1930  BP: 111/66 (!) 108/58  Pulse: (!) 115 (!) 109  Resp: 18 (!) 21  Temp: (!) 38.2 C   SpO2: 98% 98%    Last Pain:  Vitals:   08/10/17 1930  TempSrc:   PainSc: Asleep                 Shereena Berquist,W. EDMOND

## 2017-08-10 NOTE — Transfer of Care (Signed)
Immediate Anesthesia Transfer of Care Note  Patient: Thomas Howell  Procedure(s) Performed: Procedure(s): CYSTOSCOPY WITH RETROGRADE PYELOGRAM/URETERAL STENT PLACEMENT (Left)  Patient Location: PACU  Anesthesia Type:General  Level of Consciousness:  sedated, patient cooperative and responds to stimulation  Airway & Oxygen Therapy:Patient Spontanous Breathing and Patient connected to face mask oxgen  Post-op Assessment:  Report given to PACU RN and Post -op Vital signs reviewed and stable  Post vital signs:  Reviewed and stable  Last Vitals:  Vitals:   08/10/17 1542 08/10/17 1746  BP: 122/76   Pulse: (!) 125   Resp: (!) 22   Temp: 37.8 C (!) 39.2 C  SpO2: 99%     Complications: No apparent anesthesia complications

## 2017-08-10 NOTE — Op Note (Signed)
UROLOGY OPERATIVE NOTE   Preoperative Diagnosis: Left obstructing nephrolithiasis  Postoperative Diagnosis:  Same  Procedure(s) Performed:    1. Cystourethroscopy 2. Left ureteral stent placement, 6Fr x 24cm JJ ureteral without dangler 3. Left retrograde pyelogram 4. Intraoperative fluoroscopy with interpretation <1hr.   Surgeon:  Crecencio Mc, MD  Resident Surgeon:  Callie Fielding, MD  Assistant(s):  None  Anesthesia:  General via LMA  Fluids:  See anesthesia record  Estimated blood loss:  Minimal  Cultures:  Left renal pelvic urine for gram stain and culture  Drains:   LEFT 6Fr x 24cm JJ ureteral stent without dangler  Complications:  None  Indications: 26 y.o. male  with a past medical history of nephrolithiasis presented with left obstructing UPJ stone on 8/20. He initially did not appear infected, and a urine culture was not sent. However over the last 48 hours, patient became worseningly symptomatic with intermittent fevers and some LUTS. He was tachycardic in the office where he presented for evaluation. Thus patient presents for cystourethroscopy, LEFT ureteral stent placement and left retrograde pyelogram. Risks & benefits of the procedure discussed with the patient, who wishes to proceed.  Findings:  Tortuous urethra. Normal appearing bladder. Successful placement of left ureteral stent with cloudy urine draining upon placement.  Radiologic Interpretation of Retrograde Pyelogram: Left retrograde pyelogram demonstrated no contrast extravasation, filling defects. Moderate hydroureteronephrosis to the proximal ureter.   Description:  The patient was correctly identified in the preop holding area where written informed consent as well potential risk and complication reviewed. The patient agreed. They were brought back to the operative suite where a preinduction timeout was performed. Once correct information was verified, general anesthesia was induced via LMA. They were  then gently placed into dorsal lithotomy position with SCDs in place for VTE prophylaxis. They were prepped and draped in the usual sterile fashion and given appropriate preoperative antibiotics with ceftriaxone. A second timeout was then performed.   We inserted a 27F rigid cystoscope per urethra with copious lubrication and normal saline irrigation running. This demonstrated a normal but tortuous urethra, clear yellow urine on entry to the bladder, no bladder mucosal irregularities, tumors, stones or foreign bodies.  We turned our attention to the left ureteral orifice which we cannulated with a combination of a 5 [place] open-ended catheter and a sensor wire. The sensor wire was advanced to the presumed level of the left renal pelvis where an appropriate curl was noted under fluoroscopy without difficulty. The 5 [place] open-ended catheter was advanced into the left proximal ureter and the sensor wire was removed.   Hydronephrotic drip was collected from the 5Fr open ended catheter and sent for urine gram stain and urine culture. Urine was clear but dark yellow.  A left retrograde pyelogram was performed with findings as noted above. We then replaced the sensor wire into the left kidney under fluoroscopic guidance without difficulty and removed the 5 [place] open-ended catheter. We advanced a 6Fr x 24cm JJ ureteral without dangler over the wire with the assistance of a stent pusher under direct visual and fluoroscopic guidance without difficulty. Sensor wire removal demonstrated satisfactory stent curl noted proximally in the left renal pelvis as well as distally within the bladder.  The bladder was left full and all instrumentation was removed. A 18Fr Foley urethral catheter was placed with 10 mL of sterile water put into the catheter balloon. The bladder was drained. The patient was woken up from anesthesia and taken to the recovery unit for routine  postoperative care.  Post Op Plan:   1. Admit to  urology service for post operative monitoring (step down status)  Attestation:  Dr. Laverle Patter was present for the entire procedure.    Callie Fielding, MD Resident, Department of Urology

## 2017-08-11 ENCOUNTER — Encounter (HOSPITAL_COMMUNITY): Payer: Self-pay | Admitting: Urology

## 2017-08-11 DIAGNOSIS — Z8619 Personal history of other infectious and parasitic diseases: Secondary | ICD-10-CM

## 2017-08-11 HISTORY — DX: Personal history of other infectious and parasitic diseases: Z86.19

## 2017-08-11 LAB — BASIC METABOLIC PANEL
Anion gap: 8 (ref 5–15)
BUN: 22 mg/dL — ABNORMAL HIGH (ref 6–20)
CO2: 24 mmol/L (ref 22–32)
Calcium: 8.4 mg/dL — ABNORMAL LOW (ref 8.9–10.3)
Chloride: 102 mmol/L (ref 101–111)
Creatinine, Ser: 1.66 mg/dL — ABNORMAL HIGH (ref 0.61–1.24)
GFR calc Af Amer: >60 mL/min (ref >60)
GFR calc non Af Amer: 56 mL/min — ABNORMAL LOW (ref >60)
Glucose, Bld: 153 mg/dL — ABNORMAL HIGH (ref 65–99)
Potassium: 4.4 mmol/L (ref 3.5–5.1)
Sodium: 134 mmol/L — ABNORMAL LOW (ref 135–145)

## 2017-08-11 LAB — CBC
HCT: 35.3 % — ABNORMAL LOW (ref 39.0–52.0)
Hemoglobin: 12.7 g/dL — ABNORMAL LOW (ref 13.0–17.0)
MCH: 30.5 pg (ref 26.0–34.0)
MCHC: 36 g/dL (ref 30.0–36.0)
MCV: 84.9 fL (ref 78.0–100.0)
Platelets: 116 10*3/uL — ABNORMAL LOW (ref 150–400)
RBC: 4.16 MIL/uL — ABNORMAL LOW (ref 4.22–5.81)
RDW: 12.5 % (ref 11.5–15.5)
WBC: 15.2 10*3/uL — ABNORMAL HIGH (ref 4.0–10.5)

## 2017-08-11 MED ORDER — VANCOMYCIN HCL 10 G IV SOLR
2000.0000 mg | Freq: Once | INTRAVENOUS | Status: AC
  Administered 2017-08-11: 2000 mg via INTRAVENOUS
  Filled 2017-08-11: qty 2000

## 2017-08-11 MED ORDER — VANCOMYCIN HCL IN DEXTROSE 750-5 MG/150ML-% IV SOLN
750.0000 mg | Freq: Two times a day (BID) | INTRAVENOUS | Status: DC
  Filled 2017-08-11: qty 150

## 2017-08-11 MED ORDER — DIPHENHYDRAMINE HCL 25 MG PO CAPS
25.0000 mg | ORAL_CAPSULE | Freq: Once | ORAL | Status: AC
  Administered 2017-08-11: 25 mg via ORAL
  Filled 2017-08-11: qty 1

## 2017-08-11 MED ORDER — CIPROFLOXACIN IN D5W 400 MG/200ML IV SOLN
400.0000 mg | Freq: Two times a day (BID) | INTRAVENOUS | Status: DC
  Administered 2017-08-11: 400 mg via INTRAVENOUS
  Filled 2017-08-11: qty 200

## 2017-08-11 NOTE — Progress Notes (Signed)
1 Day Post-Op Subjective: Patient reports feels tired. Wants foley out. No other complaints.   Objective: Vital signs in last 24 hours: Temp:  [97.7 F (36.5 C)-102.5 F (39.2 C)] 97.7 F (36.5 C) (08/23 0800) Pulse Rate:  [67-126] 80 (08/23 1000) Resp:  [12-25] 18 (08/23 1000) BP: (99-142)/(49-76) 107/54 (08/23 0533) SpO2:  [91 %-100 %] 93 % (08/23 1000) Weight:  [109.8 kg (242 lb)-111 kg (244 lb 11.4 oz)] 111 kg (244 lb 11.4 oz) (08/22 1947)  Intake/Output from previous day: 08/22 0701 - 08/23 0700 In: 3432.5 [I.V.:3282.5; IV Piggyback:150] Out: 1925 [Urine:1925] Intake/Output this shift: No intake/output data recorded.  Physical Exam:  NAD CV - RRR Alert and Ox3 Ext - no pain, swelling or tenderness  GU - urine clear - about 550 in bag   Lab Results:  Recent Labs  08/11/17 0310  HGB 12.7*  HCT 35.3*   BMET  Recent Labs  08/11/17 0310  NA 134*  K 4.4  CL 102  CO2 24  GLUCOSE 153*  BUN 22*  CREATININE 1.66*  CALCIUM 8.4*   No results for input(s): LABPT, INR in the last 72 hours. No results for input(s): LABURIN in the last 72 hours. Results for orders placed or performed during the hospital encounter of 08/10/17  Urine Culture     Status: None (Preliminary result)   Collection Time: 08/10/17  6:25 PM  Result Value Ref Range Status   Specimen Description URINE, RANDOM  Final   Special Requests   Final    LEFT RENAL PELVIS Performed at [facility], [address] [place], [place] [address]    Culture PENDING  Incomplete   Report Status PENDING  Incomplete  Gram stain     Status: None   Collection Time: 08/10/17  6:25 PM  Result Value Ref Range Status   Specimen Description URINE, RANDOM  Final   Special Requests NONE  Final   Gram Stain   Final    WBC PRESENT,BOTH PMN AND MONONUCLEAR GRAM POSITIVE COCCI IN PAIRS Performed at [facility], [address], [place], [place] [address]    Report Status 08/10/2017 FINAL  Final  MRSA  PCR Screening     Status: None   Collection Time: 08/10/17  8:07 PM  Result Value Ref Range Status   MRSA by PCR NEGATIVE NEGATIVE Final    Comment:        The GeneXpert MRSA Assay (FDA approved for NASAL specimens only), is one component of a comprehensive MRSA colonization surveillance program. It is not intended to diagnose MRSA infection nor to guide or monitor treatment for MRSA infections.     Studies/Results: Dg C-arm 1-60 Min-no Report  Result Date: 08/10/2017 Fluoroscopy was utilized by the requesting physician.  No radiographic interpretation.    Assessment/Plan: -AKI - likely from hydro and sepsis, should improve -sepsis - cx pending. Source controlled. S/p cefazolin and on ceftriaxone. Only prior Cx I could find was from office and was staph sensitive to cipro, tetracycline, vanc, gent. I'll add Cipro.  -left proximal stone - s/p stent -recurrent ca phosphate stone - pt saw nephrology last fall and po fluids increased as well as K citrate. Needs neph f/u.    LOS: 1 day   Thomas Howell 08/11/2017, 10:48 AM

## 2017-08-11 NOTE — Care Management Note (Signed)
Case Management Note  Patient Details  Name: Thomas Howell MRN: [id number] Date of Birth: [birth year]  Subjective/Objective:       Urosepsis and   Left obstructing nephrolothasis           Action/Plan:   Expected Discharge Date:                  Expected Discharge Plan:  Home/Self Care  In-House Referral:     Discharge planning Services  CM Consult  Post Acute Care Choice:    Choice offered to:     DME Arranged:    DME Agency:     HH Arranged:    HH Agency:     Status of Service:  In process, will continue to follow  If discussed at [place] Length of Stay Meetings, dates discussed:    Additional Comments:  Golda Acre, RN 08/11/2017, 9:24 AM

## 2017-08-11 NOTE — Progress Notes (Signed)
Pharmacy Antibiotic Note  Thomas Howell is a 26 y.o. male admitted on 08/10/2017 with sepsis d/t UTI and GPCs in urine. Patient experienced reaction to Cipro, so based on prior Cx (staph sensitive to Cipro, tetracycline, vanc, gent - would seem to imply MRSA) Pharmacy has been consulted for vancomcyin dosing.  Plan:  Vancomycin 2000 mg IV now, then 750 mg IV q12 hr; goal trough 10-15 mcg/mL for UTI  Measure vancomycin trough levels at steady state as indicated  Daily SCr   Height: 5\' 8"  (172.7 cm) Weight: 244 lb 11.4 oz (111 kg) IBW/kg (Calculated) : 68.4  Temp (24hrs), Avg:98.1 F (36.7 C), Min:97.6 F (36.4 C), Max:98.7 F (37.1 C)   Recent Labs Lab 08/11/17 0310  WBC 15.2*  CREATININE 1.66*    Estimated Creatinine Clearance: 82.2 mL/min (A) (by C-G formula based on SCr of 1.66 mg/dL (H)).    Allergies  Allergen Reactions  . Codeine Swelling    Lips swell//  This includes cough syrup w/ codeine  . Adhesive [Tape] Rash      Thank you for allowing pharmacy to be a part of this patient's care.  Bernadene Person, PharmD, BCPS Pager: [phone] 08/11/2017, 9:24 PM

## 2017-08-11 NOTE — Progress Notes (Addendum)
Pt c/o skin itching-- face, chest, upper back and arms were slightly red, Pt states that skin "felt like a sunburn". Contacted Pharmacy regarding possible med allergy to Cipro IV. Paged MD. No orders given at this time. Relayed to night RN to monitor.

## 2017-08-12 LAB — BASIC METABOLIC PANEL
Anion gap: 5 (ref 5–15)
BUN: 18 mg/dL (ref 6–20)
CO2: 26 mmol/L (ref 22–32)
Calcium: 8.5 mg/dL — ABNORMAL LOW (ref 8.9–10.3)
Chloride: 108 mmol/L (ref 101–111)
Creatinine, Ser: 1.23 mg/dL (ref 0.61–1.24)
GFR calc Af Amer: >60 mL/min (ref >60)
GFR calc non Af Amer: >60 mL/min (ref >60)
Glucose, Bld: 139 mg/dL — ABNORMAL HIGH (ref 65–99)
Potassium: 4 mmol/L (ref 3.5–5.1)
Sodium: 139 mmol/L (ref 135–145)

## 2017-08-12 LAB — CBC
HCT: 37.2 % — ABNORMAL LOW (ref 39.0–52.0)
Hemoglobin: 13.4 g/dL (ref 13.0–17.0)
MCH: 30.7 pg (ref 26.0–34.0)
MCHC: 36 g/dL (ref 30.0–36.0)
MCV: 85.1 fL (ref 78.0–100.0)
Platelets: 160 10*3/uL (ref 150–400)
RBC: 4.37 MIL/uL (ref 4.22–5.81)
RDW: 12.5 % (ref 11.5–15.5)
WBC: 16.8 10*3/uL — ABNORMAL HIGH (ref 4.0–10.5)

## 2017-08-12 MED ORDER — SULFAMETHOXAZOLE-TRIMETHOPRIM 800-160 MG PO TABS: 1.0000 | ORAL_TABLET | Freq: Two times a day (BID) | ORAL | 0 refills | Status: DC

## 2017-08-12 MED ORDER — OXYCODONE-ACETAMINOPHEN 5-325 MG PO TABS: 1.0000 | ORAL_TABLET | Freq: Four times a day (QID) | ORAL | 0 refills | Status: DC | PRN

## 2017-08-12 NOTE — Discharge Instructions (Signed)

## 2017-08-12 NOTE — Discharge Summary (Signed)
Physician Discharge Summary  Patient ID: Thomas Howell MRN: [id number] DOB/AGE: 02-24-1991 26 y.o.  Admit date: 08/10/2017 Discharge date: 08/12/2017  Admission Diagnoses: left proximal stone, sepsis  Discharge Diagnoses:  Active Problems:   Nephrolithiasis   Discharged Condition: good  Hospital Course: Admitted following urgent left stent for a left proximal stone and sepsis (fever and tachycardia after stent placed). He remained stable and had no further fever. POD#2 he was AFVSS and looked well. Urine cx from procedure growing gram + cocci in pairs. Prior office cx staph epi, so I will d/c on Bactrim. Pt reports he has no pain meds remaining and I gave him #12 percocet 5/325.   Consults: None  Significant Diagnostic Studies: none  Treatments: procedures: cystoscopy, left ureteral stent placement   Discharge Exam: Blood pressure 136/75, pulse 85, temperature 98.1 F (36.7 C), temperature source Oral, resp. rate 20, height 5\' 8"  (1.727 m), weight 111 kg (244 lb 11.4 oz), SpO2 96 %. NAD Alert and O x 3 CV - RRR Resp - nl effort and depth  Abd - soft, NT Ext - no calf pain or swelling   Disposition: 01-Home or Self Care   Allergies as of 08/12/2017      Reactions   Codeine Swelling   Lips swell//  This includes cough syrup w/ codeine   Adhesive [tape] Rash      Medication List    TAKE these medications   BOTOX 100 units Solr injection Generic drug:  botulinum toxin Type A INJECT IN THE MUSCLE TO HEAD AND NECK MUSCLES EVERY 3 MONTHS BY PROVIDER   ibuprofen 200 MG tablet Commonly known as:  ADVIL,MOTRIN Take 200 mg by mouth every 6 (six) hours as needed for headache or mild pain.   lamoTRIgine 100 MG tablet Commonly known as:  LAMICTAL Take 1 tablet (100 mg total) by mouth daily. Take 1/2 po qd x 5 days, then 1/2 po bid x 5 d, then 1/2 po tid x 5d then 1 po bid What changed:  when to take this  additional instructions   levETIRAcetam 750 MG  tablet Commonly known as:  KEPPRA TAKE 1 TABLET BY MOUTH EVERY MORNING AND 2 TABLETS EVERY NIGHT AT BEDTIME   naproxen sodium 220 MG tablet Commonly known as:  ANAPROX Take 440 mg by mouth 2 (two) times daily with a meal.   ondansetron 4 MG tablet Commonly known as:  ZOFRAN Take 1 tablet (4 mg total) by mouth every 8 (eight) hours as needed for nausea or vomiting.   oxyCODONE-acetaminophen 5-325 MG tablet Commonly known as:  PERCOCET/ROXICET Take 1 tablet by mouth every 4 (four) hours as needed. What changed:  Another medication with the same name was added. Make sure you understand how and when to take each.   oxyCODONE-acetaminophen 5-325 MG tablet Commonly known as:  PERCOCET/ROXICET Take 1 tablet by mouth every 4 (four) hours as needed. What changed:  Another medication with the same name was added. Make sure you understand how and when to take each.   oxyCODONE-acetaminophen 5-325 MG tablet Commonly known as:  ROXICET Take 1 tablet by mouth every 6 (six) hours as needed. What changed:  You were already taking a medication with the same name, and this prescription was added. Make sure you understand how and when to take each.   promethazine 25 MG tablet Commonly known as:  PHENERGAN Take 1 tablet (25 mg total) by mouth every 6 (six) hours as needed for nausea or vomiting.  rizatriptan 10 MG disintegrating tablet Commonly known as:  MAXALT-MLT Take 1 tablet (10 mg total) by mouth as needed for migraine. May repeat in 2 hours if needed; max 2 per 24 hrs   sulfamethoxazole-trimethoprim 800-160 MG tablet Commonly known as:  BACTRIM DS,SEPTRA DS Take 1 tablet by mouth 2 (two) times daily.   tamsulosin 0.4 MG Caps capsule Commonly known as:  FLOMAX Take 0.4 mg by mouth daily as needed (kidney stones).            Discharge Care Instructions        Start     Ordered   08/12/17 0000  sulfamethoxazole-trimethoprim (BACTRIM DS,SEPTRA DS) 800-160 MG tablet  2 times daily      08/12/17 0744   08/12/17 0000  oxyCODONE-acetaminophen (ROXICET) 5-325 MG tablet  Every 6 hours PRN     08/12/17 0744     Follow-up Information    Jerilee Field, MD Follow up.   Specialty:  Urology Why:  office will call with appointment / surgery date  Contact information: [address] [place] [address] [phone]           Signed: Jerilee Field 08/12/2017, 7:45 AM

## 2017-08-12 NOTE — Progress Notes (Signed)
Pt education completed to include future appointments, current prescriptions and medications, and doctors discharge instructions. Pt alert and oriented, vital signs stable. Pt discharged with nursing staff. Temp: 98.1 F (36.7 C) (08/23 2310) Temp Source: Oral (08/23 2310) BP: 136/75 (08/23 2342) Pulse Rate: 85 (08/23 2342)  Jessica Priest 08/12/2017 9:12 AM

## 2017-08-12 NOTE — Progress Notes (Signed)
Date: August 12, 2017  Discharge orders review for case management needs.  None found  Per patient or family member no additional needs at home. [name], BSN, RN3, CCM:  [phone]

## 2017-08-13 LAB — URINE CULTURE: Culture: >=100000 — AB

## 2017-08-15 MED FILL — Oxycodone w/ Acetaminophen Tab 5-325 MG: ORAL | Qty: 6 | Status: AC

## 2017-08-16 ENCOUNTER — Other Ambulatory Visit: Payer: Self-pay | Admitting: Urology

## 2017-08-16 ENCOUNTER — Encounter (HOSPITAL_BASED_OUTPATIENT_CLINIC_OR_DEPARTMENT_OTHER): Payer: Self-pay | Admitting: *Deleted

## 2017-08-16 DIAGNOSIS — N202 Calculus of kidney with calculus of ureter: Secondary | ICD-10-CM | POA: Diagnosis not present

## 2017-08-16 NOTE — Progress Notes (Signed)
Pt instructed npo pmn 8/30 x keppra, lamictal, potassium w sip of water.  Ok to take pain/nausea med w sip of water prn.  To [facility] 8/31 @ 0800.  Needs cxr, ekg on arrival.  Labs in epic. Pt to bring maxalt w him .

## 2017-08-19 ENCOUNTER — Ambulatory Visit (HOSPITAL_BASED_OUTPATIENT_CLINIC_OR_DEPARTMENT_OTHER): Payer: BLUE CROSS/BLUE SHIELD | Admitting: Anesthesiology

## 2017-08-19 ENCOUNTER — Ambulatory Visit (HOSPITAL_COMMUNITY): Payer: BLUE CROSS/BLUE SHIELD

## 2017-08-19 ENCOUNTER — Other Ambulatory Visit: Payer: Self-pay

## 2017-08-19 ENCOUNTER — Encounter (HOSPITAL_BASED_OUTPATIENT_CLINIC_OR_DEPARTMENT_OTHER)
Admission: RE | Disposition: A | Discharge status: Home / Self Care | Payer: Self-pay | Source: Ambulatory Visit | Attending: Urology

## 2017-08-19 ENCOUNTER — Ambulatory Visit (HOSPITAL_BASED_OUTPATIENT_CLINIC_OR_DEPARTMENT_OTHER)
Admission: RE | Admit: 2017-08-19 | Discharge: 2017-08-19 | Disposition: A | Discharge status: Home / Self Care | Payer: BLUE CROSS/BLUE SHIELD | Source: Ambulatory Visit | Attending: Urology | Admitting: Urology

## 2017-08-19 ENCOUNTER — Encounter (HOSPITAL_BASED_OUTPATIENT_CLINIC_OR_DEPARTMENT_OTHER): Payer: Self-pay

## 2017-08-19 DIAGNOSIS — M542 Cervicalgia: Secondary | ICD-10-CM | POA: Diagnosis not present

## 2017-08-19 DIAGNOSIS — Z885 Allergy status to narcotic agent status: Secondary | ICD-10-CM | POA: Insufficient documentation

## 2017-08-19 DIAGNOSIS — N202 Calculus of kidney with calculus of ureter: Secondary | ICD-10-CM | POA: Diagnosis not present

## 2017-08-19 DIAGNOSIS — Z87891 Personal history of nicotine dependence: Secondary | ICD-10-CM | POA: Insufficient documentation

## 2017-08-19 DIAGNOSIS — Z79899 Other long term (current) drug therapy: Secondary | ICD-10-CM | POA: Insufficient documentation

## 2017-08-19 DIAGNOSIS — G43009 Migraine without aura, not intractable, without status migrainosus: Secondary | ICD-10-CM | POA: Diagnosis not present

## 2017-08-19 DIAGNOSIS — Z882 Allergy status to sulfonamides status: Secondary | ICD-10-CM | POA: Insufficient documentation

## 2017-08-19 DIAGNOSIS — M419 Scoliosis, unspecified: Secondary | ICD-10-CM | POA: Diagnosis not present

## 2017-08-19 DIAGNOSIS — N200 Calculus of kidney: Secondary | ICD-10-CM | POA: Diagnosis not present

## 2017-08-19 DIAGNOSIS — N201 Calculus of ureter: Secondary | ICD-10-CM

## 2017-08-19 DIAGNOSIS — Z01818 Encounter for other preprocedural examination: Secondary | ICD-10-CM | POA: Diagnosis not present

## 2017-08-19 DIAGNOSIS — Z87442 Personal history of urinary calculi: Secondary | ICD-10-CM | POA: Diagnosis not present

## 2017-08-19 HISTORY — DX: Pain due to genitourinary prosthetic devices, implants and grafts, initial encounter: T83.84XA

## 2017-08-19 HISTORY — DX: Personal history of other infectious and parasitic diseases: Z86.19

## 2017-08-19 HISTORY — DX: Personal history of other specified conditions: Z87.898

## 2017-08-19 HISTORY — DX: Other neuromuscular dysfunction of bladder: N31.8

## 2017-08-19 HISTORY — PX: CYSTOSCOPY/URETEROSCOPY/HOLMIUM LASER/STENT PLACEMENT: SHX6546

## 2017-08-19 SURGERY — CYSTOSCOPY/URETEROSCOPY/HOLMIUM LASER/STENT PLACEMENT
Anesthesia: General | Site: Ureter | Laterality: Left

## 2017-08-19 MED ORDER — FENTANYL CITRATE (PF) 100 MCG/2ML IJ SOLN
25.0000 ug | INTRAMUSCULAR | Status: DC | PRN
  Administered 2017-08-19: 25 ug via INTRAVENOUS
  Administered 2017-08-19: 50 ug via INTRAVENOUS
  Administered 2017-08-19 (×3): 25 ug via INTRAVENOUS
  Filled 2017-08-19: qty 0.5

## 2017-08-19 MED ORDER — LACTATED RINGERS IV SOLN
INTRAVENOUS | Status: DC
  Administered 2017-08-19 (×2): via INTRAVENOUS
  Filled 2017-08-19: qty 1000

## 2017-08-19 MED ORDER — PROPOFOL 10 MG/ML IV BOLUS
INTRAVENOUS | Status: DC | PRN
  Administered 2017-08-19: 200 mg via INTRAVENOUS

## 2017-08-19 MED ORDER — CIPROFLOXACIN IN D5W 400 MG/200ML IV SOLN
400.0000 mg | INTRAVENOUS | Status: AC
  Administered 2017-08-19 (×2): 400 mg via INTRAVENOUS
  Filled 2017-08-19: qty 200

## 2017-08-19 MED ORDER — KETOROLAC TROMETHAMINE 30 MG/ML IJ SOLN
INTRAMUSCULAR | Status: AC
  Filled 2017-08-19: qty 1

## 2017-08-19 MED ORDER — PROMETHAZINE HCL 25 MG/ML IJ SOLN
6.2500 mg | INTRAMUSCULAR | Status: DC | PRN
  Filled 2017-08-19: qty 1

## 2017-08-19 MED ORDER — FENTANYL CITRATE (PF) 100 MCG/2ML IJ SOLN
INTRAMUSCULAR | Status: AC
  Filled 2017-08-19: qty 2

## 2017-08-19 MED ORDER — SODIUM CHLORIDE 0.9 % IR SOLN
Status: DC | PRN
  Administered 2017-08-19: 4000 mL

## 2017-08-19 MED ORDER — MIDAZOLAM HCL 5 MG/5ML IJ SOLN
INTRAMUSCULAR | Status: DC | PRN
  Administered 2017-08-19: 2 mg via INTRAVENOUS

## 2017-08-19 MED ORDER — METOCLOPRAMIDE HCL 5 MG/ML IJ SOLN
INTRAMUSCULAR | Status: DC | PRN
  Administered 2017-08-19: 10 mg via INTRAVENOUS

## 2017-08-19 MED ORDER — PROPOFOL 10 MG/ML IV BOLUS
INTRAVENOUS | Status: AC
  Filled 2017-08-19: qty 40

## 2017-08-19 MED ORDER — METOCLOPRAMIDE HCL 5 MG/ML IJ SOLN
INTRAMUSCULAR | Status: AC
  Filled 2017-08-19: qty 2

## 2017-08-19 MED ORDER — LIDOCAINE 2% (20 MG/ML) 5 ML SYRINGE
INTRAMUSCULAR | Status: DC | PRN
  Administered 2017-08-19: 100 mg via INTRAVENOUS

## 2017-08-19 MED ORDER — ONDANSETRON HCL 4 MG/2ML IJ SOLN
INTRAMUSCULAR | Status: DC | PRN
  Administered 2017-08-19: 4 mg via INTRAVENOUS

## 2017-08-19 MED ORDER — OXYCODONE HCL 5 MG PO TABS
5.0000 mg | ORAL_TABLET | Freq: Once | ORAL | Status: AC
  Administered 2017-08-19: 5 mg via ORAL
  Filled 2017-08-19: qty 1

## 2017-08-19 MED ORDER — OXYCODONE HCL 5 MG PO TABS
ORAL_TABLET | ORAL | Status: AC
  Filled 2017-08-19: qty 1

## 2017-08-19 MED ORDER — MIDAZOLAM HCL 2 MG/2ML IJ SOLN
INTRAMUSCULAR | Status: AC
  Filled 2017-08-19: qty 2

## 2017-08-19 MED ORDER — CIPROFLOXACIN IN D5W 400 MG/200ML IV SOLN
INTRAVENOUS | Status: AC
  Filled 2017-08-19: qty 200

## 2017-08-19 MED ORDER — HYDROMORPHONE HCL 1 MG/ML IJ SOLN
0.2500 mg | INTRAMUSCULAR | Status: DC | PRN
  Administered 2017-08-19: 0.5 mg via INTRAVENOUS
  Filled 2017-08-19: qty 0.5

## 2017-08-19 MED ORDER — HYDROMORPHONE HCL-NACL 0.5-0.9 MG/ML-% IV SOSY
PREFILLED_SYRINGE | INTRAVENOUS | Status: AC
  Filled 2017-08-19: qty 1

## 2017-08-19 MED ORDER — ONDANSETRON HCL 4 MG/2ML IJ SOLN
INTRAMUSCULAR | Status: AC
  Filled 2017-08-19: qty 2

## 2017-08-19 MED ORDER — IOHEXOL 300 MG/ML  SOLN
INTRAMUSCULAR | Status: DC | PRN
  Administered 2017-08-19: 13 mL

## 2017-08-19 MED ORDER — LIDOCAINE 2% (20 MG/ML) 5 ML SYRINGE
INTRAMUSCULAR | Status: AC
  Filled 2017-08-19: qty 5

## 2017-08-19 MED ORDER — DEXAMETHASONE SODIUM PHOSPHATE 4 MG/ML IJ SOLN
INTRAMUSCULAR | Status: DC | PRN
  Administered 2017-08-19: 10 mg via INTRAVENOUS

## 2017-08-19 MED ORDER — DEXAMETHASONE SODIUM PHOSPHATE 10 MG/ML IJ SOLN
INTRAMUSCULAR | Status: AC
  Filled 2017-08-19: qty 1

## 2017-08-19 SURGICAL SUPPLY — 29 items
BAG DRAIN URO-CYSTO SKYTR STRL (DRAIN) ×2 IMPLANT
BASKET LASER NITINOL 1.9FR (BASKET) IMPLANT
BASKET ZERO TIP NITINOL 2.4FR (BASKET) ×2 IMPLANT
CATH INTERMIT  6FR 70CM (CATHETERS) ×2 IMPLANT
CATH URET 5FR 28IN CONE TIP (BALLOONS)
CATH URET 5FR 70CM CONE TIP (BALLOONS) IMPLANT
CATH URET DUAL LUMEN 6-10FR 50 (CATHETERS) ×2 IMPLANT
CLOTH BEACON ORANGE TIMEOUT ST (SAFETY) ×2 IMPLANT
FIBER LASER FLEXIVA 365 (UROLOGICAL SUPPLIES) IMPLANT
FIBER LASER TRAC TIP (UROLOGICAL SUPPLIES) ×2 IMPLANT
GLOVE BIO SURGEON STRL SZ 6.5 (GLOVE) ×2 IMPLANT
GLOVE BIO SURGEON STRL SZ7.5 (GLOVE) ×2 IMPLANT
GLOVE BIOGEL M 6.5 STRL (GLOVE) ×4 IMPLANT
GOWN STRL REUS W/TWL LRG LVL3 (GOWN DISPOSABLE) ×2 IMPLANT
GOWN STRL REUS W/TWL XL LVL3 (GOWN DISPOSABLE) ×2 IMPLANT
GUIDEWIRE 0.038 PTFE COATED (WIRE) IMPLANT
GUIDEWIRE ANG ZIPWIRE 038X150 (WIRE) ×2 IMPLANT
GUIDEWIRE STR DUAL SENSOR (WIRE) ×2 IMPLANT
INFUSOR MANOMETER BAG 3000ML (MISCELLANEOUS) ×2 IMPLANT
IV NS 1000ML (IV SOLUTION) ×1
IV NS 1000ML BAXH (IV SOLUTION) ×1 IMPLANT
IV NS IRRIG 3000ML ARTHROMATIC (IV SOLUTION) ×2 IMPLANT
KIT RM TURNOVER CYSTO AR (KITS) ×2 IMPLANT
MANIFOLD NEPTUNE II (INSTRUMENTS) ×2 IMPLANT
PACK CYSTO (CUSTOM PROCEDURE TRAY) ×2 IMPLANT
SHEATH ACCESS URETERAL 38CM (SHEATH) ×2 IMPLANT
STENT URET 6FRX24 CONTOUR (STENTS) ×2 IMPLANT
TUBE CONNECTING 12X1/4 (SUCTIONS) ×2 IMPLANT
WATER STERILE IRR 500ML POUR (IV SOLUTION) ×2 IMPLANT

## 2017-08-19 NOTE — Progress Notes (Signed)
[id number] pt hurting wants something for pain . Talked with Dr. Bradley FerrisEllender . See orders. Fentyanl 25 mcg given times 2 for pain scale of 6-7

## 2017-08-19 NOTE — Discharge Instructions (Signed)
Ureteral Stent Implantation, Care After Refer to this sheet in the next few weeks. These instructions provide you with information about caring for yourself after your procedure. Your health care provider may also give you more specific instructions. Your treatment has been planned according to current medical practices, but problems sometimes occur. Call your health care provider if you have any problems or questions after your procedure.  REMOVAL OF THE STENT: remove the stent Tuesday morning, Aug 23, 2017 by pulling the string  What can I expect after the procedure? After the procedure, it is common to have:  Nausea.  Mild pain when you urinate. You may feel this pain in your lower back or lower abdomen. Pain should stop within a few minutes after you urinate. This may last for up to 1 week.  A small amount of blood in your urine for several days.  Follow these instructions at home:  Medicines  Take over-the-counter and prescription medicines only as told by your health care provider.  If you were prescribed an antibiotic medicine, take it as told by your health care provider. Do not stop taking the antibiotic even if you start to feel better.  Do not drive for 24 hours if you received a sedative.  Do not drive or operate heavy machinery while taking prescription pain medicines. Activity  Return to your normal activities as told by your health care provider. Ask your health care provider what activities are safe for you.  Do not lift anything that is heavier than 10 lb (4.5 kg). Follow this limit for 1 week after your procedure, or for as long as told by your health care provider. General instructions  Watch for any blood in your urine. Call your health care provider if the amount of blood in your urine increases.  If you have a catheter: ? Follow instructions from your health care provider about taking care of your catheter and collection bag. ? Do not take baths, swim, or use  a hot tub until your health care provider approves.  Drink enough fluid to keep your urine clear or pale yellow.  Keep all follow-up visits as told by your health care provider. This is important. Contact a health care provider if:  You have pain that gets worse or does not get better with medicine, especially pain when you urinate.  You have difficulty urinating.  You feel nauseous or you vomit repeatedly during a period of more than 2 days after the procedure. Get help right away if:  Your urine is dark red or has blood clots in it.  You are leaking urine (have incontinence).  The end of the stent comes out of your urethra.  You cannot urinate.  You have sudden, sharp, or severe pain in your abdomen or lower back.  You have a fever. This information is not intended to replace advice given to you by your health care provider. Make sure you discuss any questions you have with your health care provider. Document Released: 08/08/2013 Document Revised: 05/13/2016 Document Reviewed: 06/20/2015 Elsevier Interactive Patient Education  2018 [employer].   Post Anesthesia Home Care Instructions  Activity: Get plenty of rest for the remainder of the day. A responsible individual must stay with you for 24 hours following the procedure.  For the next 24 hours, DO NOT: -Drive a car -Advertising copywriter -Drink alcoholic beverages -Take any medication unless instructed by your physician -Make any legal decisions or sign important papers.  Meals: Start with liquid foods  such as gelatin or soup. Progress to regular foods as tolerated. Avoid greasy, spicy, heavy foods. If nausea and/or vomiting occur, drink only clear liquids until the nausea and/or vomiting subsides. Call your physician if vomiting continues.  Special Instructions/Symptoms: Your throat may feel dry or sore from the anesthesia or the breathing tube placed in your throat during surgery. If this causes discomfort, gargle  with warm salt water. The discomfort should disappear within 24 hours.  If you had a scopolamine patch placed behind your ear for the management of post- operative nausea and/or vomiting:  1. The medication in the patch is effective for 72 hours, after which it should be removed.  Wrap patch in a tissue and discard in the trash. Wash hands thoroughly with soap and water. 2. You may remove the patch earlier than 72 hours if you experience unpleasant side effects which may include dry mouth, dizziness or visual disturbances. 3. Avoid touching the patch. Wash your hands with soap and water after contact with the patch.

## 2017-08-19 NOTE — Op Note (Addendum)
Preoperative diagnosis: Left proximal ureteral stone, left renal stones Postoperative diagnosis: Left renal stones  Procedure: Cystoscopy with left retrograde pyelogram, left ureteroscopy with holmium laser lithotripsy and the left ureteral stent exchange  Surgeon: Mena GoesEskridge  Anesthesia: Gen.  Indication for procedure: 26 year old with recurrent calcium phosphate stones on the left he was urgently stented 4 left proximal stone and a staph epidermidis UTI last week. He presents for definitive ureteroscopy today. His been on 9 days of antibiotics.  Findings: Stones were all in the kidney, in the lower pole on ureteroscopy.  Description of procedure: After consent was obtained the patient was brought to the operating room. After adequate anesthesia he was placed in lithotomy position and prepped and draped in the usual sterile fashion. A timeout was performed to confirm the patient and procedure. The cystoscope was passed per urethra and the left ureteral stent grasped and removed through the urethral meatus. A sensor wire was advanced and cold in the kidney. A dual-lumen exchange catheter was used to access the left ureter and retrograde injection of contrast performed outlined a normal ureter without filling defect and I placed a Glidewire with the dual-lumen. Over the sensor wire placed a medium access sheath and the dual channel digital ureteroscope was advanced into the kidney without difficulty. 3 large stones were noted in the lower pole and these were grasped with a 0 tip basket and dropped in the upper pole for ease of lithotripsy. They were dusted metas setting of 0.3 and 53-1 mm fragments. The majority of the stones washed away as grains of sand. I grasped a fragment and pulled it out and it was a millimeter or 2. I dusted the stones further and found no other significant fragments. The collecting system was inspected in its entirety and no other stones were noted. The renal pelvis and ureter  were inspected on the way out and noted to be free of stone, fragment or injury. The wire was backloaded on the cystoscope and a 6 x 24 cm stent was advanced. The wire was removed with a good coil seen in the kidney and a good coil in the bladder. I left a string on the stent. He was awakened and taken to the recovery room in stable condition.  Complications: None Blood loss: Minimal Specimens: None Drains: 6 x 24 cm left ureteral stent with string

## 2017-08-19 NOTE — Anesthesia Postprocedure Evaluation (Signed)
Anesthesia Post Note  Patient: Thomas Howell  Procedure(s) Performed: Procedure(s) (LRB): CYSTOSCOPY/URETEROSCOPY/HOLMIUM LASER/STENT PLACEMENT (Left)     Patient location during evaluation: PACU Anesthesia Type: General Level of consciousness: awake and alert Pain management: pain level controlled Vital Signs Assessment: post-procedure vital signs reviewed and stable Respiratory status: spontaneous breathing, nonlabored ventilation, respiratory function stable and patient connected to nasal cannula oxygen Cardiovascular status: blood pressure returned to baseline and stable Postop Assessment: no signs of nausea or vomiting Anesthetic complications: no    Last Vitals:  Vitals:   08/19/17 1215 08/19/17 1305  BP: 126/72 134/80  Pulse: 83 68  Resp: 15 12  Temp:  36.7 C  SpO2: 96% 97%    Last Pain:  Vitals:   08/19/17 1240  TempSrc:   PainSc: 5                  [name]

## 2017-08-19 NOTE — H&P (Signed)
Urology Admission H&P  Chief Complaint: left ureteral and renal stone.   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 He presents for URS/HLL/stent and has been well. 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 He had today off. Doing well on Cipro. No fever or dysuria. I started HCTZ and restarted K citrate.   Past Medical History:  Diagnosis Date  . Complication of anesthesia   . Frequency-urgency syndrome   . History of kidney stones   . Hx of nausea and vomiting    d/t kidney stone  . Hx of sepsis 08/11/2017   due to kidney stone/ hydronephrosis  . Left ureteral stone   . Migraine   . Pain due to ureteral stent (HCC)   . PONV (postoperative nausea and vomiting)    none recently  . Renal calculi    bilateral per ct 07-26-2016  . Scoliosis of lumbar spine 1994   treated at [facility] until age 81  . Wears glasses    Past Surgical History:  Procedure Laterality Date  . CYSTOSCOPY W/ RETROGRADES Right 07/06/2015   Procedure: CYSTOSCOPY WITH RETROGRADE PYELOGRAM;  Surgeon: Thomas Field, MD;  Location: [facility];  Service: Urology;  Laterality: Right;  . CYSTOSCOPY W/ URETERAL STENT PLACEMENT Left 08/08/2015   Procedure: CYSTOSCOPY WITH STENT REPLACEMENT;  Surgeon: Thomas Field, MD;  Location: [facility];  Service: Urology;  Laterality: Left;  . CYSTOSCOPY W/ URETERAL STENT PLACEMENT Left 02/21/2017   Procedure: CYSTOSCOPY WITH RETROGRADE PYELOGRAM/URETERAL LEFT STENT PLACEMENT WITH  LASER;  Surgeon: Thomas Pippin, MD;  Location: [facility];  Service: Urology;  Laterality: Left;  . CYSTOSCOPY W/ URETERAL STENT PLACEMENT Left 08/10/2017   Procedure: CYSTOSCOPY WITH RETROGRADE PYELOGRAM/URETERAL STENT PLACEMENT;  Surgeon: Thomas Purpura, MD;  Location: [facility];  Service: Urology;  Laterality: Left;  . CYSTOSCOPY WITH RETROGRADE PYELOGRAM, URETEROSCOPY AND STENT PLACEMENT Left 06/24/2014   Procedure: CYSTOSCOPY WITH RETROGRADE PYELOGRAM,  AND STENT PLACEMENT;  Surgeon: Thomas Molly, MD;  Location: [facility];  Service: Urology;  Laterality: Left;  . CYSTOSCOPY WITH RETROGRADE PYELOGRAM, URETEROSCOPY AND STENT PLACEMENT Left 07/05/2014   Procedure: CYSTO/LEFT URETEROSCOPY/LEFT RETROGRADE PYELOGRAM/LEFT STENT PLACEMENT;  Surgeon: Thomas Field, MD;  Location: [facility];  Service: Urology;  Laterality: Left;  . CYSTOSCOPY WITH RETROGRADE PYELOGRAM, URETEROSCOPY AND STENT PLACEMENT Left 07/06/2015   Procedure: CYSTOSCOPY WITH RETROGRADE PYELOGRAM, URETEROSCOPY , LASER, STENT PLACEMENT and BASKET EXTRACTION;  Surgeon: Thomas Field, MD;  Location: [facility];  Service: Urology;  Laterality: Left;  . CYSTOSCOPY WITH RETROGRADE PYELOGRAM, URETEROSCOPY AND STENT PLACEMENT Left 08/19/2015   Procedure: CYSTOSCOPY WITH RETROGRADE PYELOGRAM, URETEROSCOPY AND STENT PLACEMENT;  Surgeon: Thomas Field, MD;  Location: [facility];  Service: Urology;  Laterality: Left;  . CYSTOSCOPY WITH URETEROSCOPY AND STENT PLACEMENT Left 01/16/2016   Procedure: CYSTOSCOPY WITH LEFT RETROGRADE PYELOGRAM  LEFT DIGITAL URETEROSCOPY AND PLACEMENT LEFT URETERAL STENT;  Surgeon: Thomas Field, MD;  Location: [facility];  Service: Urology;  Laterality: Left;  . CYSTOSCOPY WITH URETEROSCOPY, STONE BASKETRY AND STENT PLACEMENT Left 02/13/2016   Procedure: CYSTOSCOPY WITH LEFT URETEROSCOPY, HOLMIUM LASER AND STENT PLACEMENT;  Surgeon: Thomas Field, MD;  Location: [facility];  Service: Urology;  Laterality: Left;  . CYSTOSCOPY/RETROGRADE/URETEROSCOPY/STONE EXTRACTION WITH BASKET Left 08/08/2015   Procedure: CYSTOSCOPY/RETROGRADE/URETEROSCOPY/STONE EXTRACTION WITH BASKET;  Surgeon: Thomas Field, MD;  Location: [facility];  Service: Urology;  Laterality: Left;  .  CYSTOSCOPY/URETEROSCOPY/HOLMIUM LASER/STENT PLACEMENT Left 07/30/2016   Procedure: CYSTOSCOPY/URETEROSCOPY/HOLMIUM LASER/STENT PLACEMENT;   Surgeon: Thomas Field, MD;  Location: [facility];  Service: Urology;  Laterality: Left;  . EXTRACORPOREAL SHOCK WAVE LITHOTRIPSY Left 07-07-2015  &  12-26-2014  . 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 HOLMIUM LASER APPLICATION Left 07/05/2014   Procedure: LASER LITHO;  Surgeon: Thomas Field, MD;  Location: [facility];  Service: Urology;  Laterality: Left;  . HOLMIUM LASER APPLICATION Left 08/08/2015   Procedure: HOLMIUM LASER  WITH LITHOTRIPSY ;  Surgeon: Thomas Field, MD;  Location: [facility];  Service: Urology;  Laterality: Left;  . HOLMIUM LASER APPLICATION Left 02/21/2017   Procedure: HOLMIUM LASER APPLICATION;  Surgeon: Thomas Pippin, MD;  Location: [facility];  Service: Urology;  Laterality: Left;  . LYMPH GLAND EXCISION  2003   neck--  benign    Home Medications:  Prescriptions Prior to Admission  Medication Sig Dispense Refill Last Dose  . ciprofloxacin (CIPRO) 500 MG tablet Take 500 mg by mouth 2 (two) times daily.   08/18/2017 at Unknown time  . ibuprofen (ADVIL,MOTRIN) 200 MG tablet Take 200 mg by mouth every 6 (six) hours as needed for headache or mild pain.   Past Week at Unknown time  . lamoTRIgine (LAMICTAL) 100 MG tablet Take 1 tablet (100 mg total) by mouth daily. Take 1/2 po qd x 5 days, then 1/2 po bid x 5 d, then 1/2 po tid x 5d then 1 po bid (Patient taking differently: Take 100 mg by mouth 2 (two) times daily. ) 60 tablet 11 08/18/2017 at Unknown time  . levETIRAcetam (KEPPRA) 750 MG tablet TAKE 1 TABLET BY MOUTH EVERY MORNING AND 2 TABLETS EVERY NIGHT AT BEDTIME 90 tablet 0 08/18/2017 at Unknown time  . naproxen sodium (ANAPROX) 220 MG tablet Take 440 mg by mouth 2 (two) times daily with a meal.   Past Week at Unknown time  . oxyCODONE-acetaminophen (ROXICET) 5-325 MG tablet Take 1 tablet by mouth every 6 (six) hours as needed. 12 tablet 0 08/18/2017 at  Unknown time  . tamsulosin (FLOMAX) 0.4 MG CAPS capsule Take 0.4 mg by mouth daily as needed (kidney stones).   08/18/2017 at Unknown time  . BOTOX 100 units SOLR injection INJECT IN THE MUSCLE TO HEAD AND NECK MUSCLES EVERY 3 MONTHS BY PROVIDER 2 each 2 More than a month at Unknown time  . hydrochlorothiazide (HYDRODIURIL) 25 MG tablet Take 25 mg by mouth 2 (two) times daily.   Unknown at Unknown time  . ondansetron (ZOFRAN) 4 MG tablet Take 1 tablet (4 mg total) by mouth every 8 (eight) hours as needed for nausea or vomiting. 4 tablet 0 08/15/2017  . oxyCODONE-acetaminophen (PERCOCET/ROXICET) 5-325 MG tablet Take 1 tablet by mouth every 4 (four) hours as needed. 6 tablet 0 08/10/2017 at Unknown time  . oxyCODONE-acetaminophen (PERCOCET/ROXICET) 5-325 MG tablet Take 1 tablet by mouth every 4 (four) hours as needed. (Patient not taking: Reported on 08/10/2017) 10 tablet 0 Not Taking at Unknown time  . potassium chloride SA (KLOR-CON M15) 15 MEQ tablet Take 15 mEq by mouth 2 (two) times daily. Pt takes 2 tabs, twice a day   Unknown at Unknown time  . promethazine (PHENERGAN) 25 MG tablet Take 1 tablet (25 mg total) by mouth every 6 (six) hours as needed for nausea or vomiting. (Patient not taking: Reported on 08/10/2017) 10 tablet 0  Unknown at Unknown time  . rizatriptan (MAXALT-MLT) 10 MG disintegrating tablet Take 1 tablet (10 mg total) by mouth as needed for migraine. May repeat in 2 hours if needed; max 2 per 24 hrs 10 tablet 6 08/17/2017  . sulfamethoxazole-trimethoprim (BACTRIM DS,SEPTRA DS) 800-160 MG tablet Take 1 tablet by mouth 2 (two) times daily. 20 tablet 0 08/15/2017   Allergies:  Allergies  Allergen Reactions  . Bactrim [Sulfamethoxazole-Trimethoprim] Nausea And Vomiting  . Codeine Swelling    Lips swell//  This includes cough syrup w/ codeine  . Adhesive [Tape] Rash    Paper tape ok    Family History  Problem Relation Age of Onset  . Cancer Father   . Kidney Stones Mother   . Cancer  Other   . Hypertension Other   . Hyperlipidemia Other   . Stroke Other   . Kidney Stones Brother    Social History:  reports that he quit smoking about 3 years ago. He quit after 2.00 years of use. He has never used smokeless tobacco. He reports that he drinks alcohol. He reports that he does not use drugs.  Review of Systems  All other systems reviewed and are negative.   Physical Exam:  Vital signs in last 24 hours: Temp:  [98.7 F (37.1 C)] 98.7 F (37.1 C) (08/31 0752) Pulse Rate:  [94] 94 (08/31 0752) Resp:  [18] 18 (08/31 0752) BP: (129)/(79) 129/79 (08/31 0752) SpO2:  [98 %] 98 % (08/31 0752) Weight:  [104.1 kg (229 lb 8 oz)] 104.1 kg (229 lb 8 oz) (08/31 0752) Physical Exam NAD Alert and O x 3 CV - RRR Lungs - reg effort and rate abd - soft, NT Ext - no pain or swelling   Laboratory Data:  No results found for this or any previous visit (from the past 24 hour(s)). Recent Results (from the past 240 hour(s))  Urine Culture     Status: Abnormal   Collection Time: 08/10/17  6:25 PM  Result Value Ref Range Status   Specimen Description URINE, RANDOM  Final   Special Requests   Final    LEFT RENAL PELVIS Performed at [facility], [address] [place], [place] [address]    Culture >=100,000 COLONIES/mL STAPHYLOCOCCUS EPIDERMIDIS (A)  Final   Report Status 08/13/2017 FINAL  Final   Organism ID, Bacteria STAPHYLOCOCCUS EPIDERMIDIS (A)  Final      Susceptibility   Staphylococcus epidermidis - MIC*    CIPROFLOXACIN <=0.5 SENSITIVE Sensitive     GENTAMICIN <=0.5 SENSITIVE Sensitive     NITROFURANTOIN <=16 SENSITIVE Sensitive     OXACILLIN <=0.25 SENSITIVE Sensitive     TETRACYCLINE >=16 RESISTANT Resistant     VANCOMYCIN 1 SENSITIVE Sensitive     TRIMETH/SULFA <=10 SENSITIVE Sensitive     CLINDAMYCIN <=0.25 SENSITIVE Sensitive     RIFAMPIN <=0.5 SENSITIVE Sensitive     Inducible Clindamycin NEGATIVE Sensitive     * >=100,000 COLONIES/mL STAPHYLOCOCCUS  EPIDERMIDIS  Gram stain     Status: None   Collection Time: 08/10/17  6:25 PM  Result Value Ref Range Status   Specimen Description URINE, RANDOM  Final   Special Requests NONE  Final   Gram Stain   Final    WBC PRESENT,BOTH PMN AND MONONUCLEAR GRAM POSITIVE COCCI IN PAIRS Performed at [facility], [address] [place], [place] [address]    Report Status 08/10/2017 FINAL  Final  MRSA PCR Screening     Status:  None   Collection Time: 08/10/17  8:07 PM  Result Value Ref Range Status   MRSA by PCR NEGATIVE NEGATIVE Final    Comment:        The GeneXpert MRSA Assay (FDA approved for NASAL specimens only), is one component of a comprehensive MRSA colonization surveillance program. It is not intended to diagnose MRSA infection nor to guide or monitor treatment for MRSA infections.    Creatinine: No results for input(s): CREATININE in the last 168 hours.  Impression/Assessment:  Left proximal and LLP stones -   Plan:  I discussed with the patient the nature, potential benefits, risks and alternatives to left URS/HLL/stent, including side effects of the proposed treatment, the likelihood of the patient achieving the goals of the procedure, and any potential problems that might occur during the procedure or recuperation. All questions answered. Patient elects to proceed.    Daisy Mcneel 08/19/2017, 10:20 AM

## 2017-08-19 NOTE — Anesthesia Preprocedure Evaluation (Addendum)
Anesthesia Evaluation  Patient identified by MRN, date of birth, ID band Patient awake    Reviewed: Allergy & Precautions, H&P , NPO status , Patient's Chart, lab work & pertinent test results  History of Anesthesia Complications (+) PONV  Airway Mallampati: III  TM Distance: >3 FB Neck ROM: Full    Dental no notable dental hx. (+) Teeth Intact, Dental Advisory Given,    Pulmonary former smoker,    Pulmonary exam normal breath sounds clear to auscultation       Cardiovascular Exercise Tolerance: Good negative cardio ROS   Rhythm:Regular Rate:Normal  ECG: SR, rate 85   Neuro/Psych  Headaches,    GI/Hepatic negative GI ROS, Neg liver ROS,   Endo/Other  negative endocrine ROS  Renal/GU Left stone   LEFT PROXIMAL STONE    Musculoskeletal   Abdominal (+) + obese,   Peds  Hematology  (+) anemia ,   Anesthesia Other Findings   Reproductive/Obstetrics                          Anesthesia Physical  Anesthesia Plan  ASA: II  Anesthesia Plan: General   Post-op Pain Management:    Induction: Intravenous  PONV Risk Score and Plan: 2 and Ondansetron and Dexamethasone  Airway Management Planned: LMA  Additional Equipment:   Intra-op Plan:   Post-operative Plan: Extubation in OR  Informed Consent: I have reviewed the patients History and Physical, chart, labs and discussed the procedure including the risks, benefits and alternatives for the proposed anesthesia with the patient or authorized representative who has indicated his/her understanding and acceptance.   Dental advisory given  Plan Discussed with: CRNA  Anesthesia Plan Comments:       Anesthesia Quick Evaluation

## 2017-08-19 NOTE — Transfer of Care (Signed)
Immediate Anesthesia Transfer of Care Note  Patient: Thomas Howell  Procedure(s) Performed: Procedure(s): CYSTOSCOPY/URETEROSCOPY/HOLMIUM LASER/STENT PLACEMENT (Left)  Patient Location: PACU  Anesthesia Type:General  Level of Consciousness: awake and alert   Airway & Oxygen Therapy: Patient Spontanous Breathing  Post-op Assessment: Report given to RN, Post -op Vital signs reviewed and stable and Patient moving all extremities  Post vital signs: Reviewed and stable  Last Vitals:  Vitals:   08/19/17 1215 08/19/17 1305  BP: 126/72 134/80  Pulse: 83 68  Resp: 15 12  Temp:  36.7 C  SpO2: 96% 97%    Last Pain:  Vitals:   08/19/17 1240  TempSrc:   PainSc: 5       Patients Stated Pain Goal: 3 (08/19/17 [id number])  Complications: No apparent anesthesia complications

## 2017-08-19 NOTE — Anesthesia Procedure Notes (Signed)
Procedure Name: LMA Insertion Date/Time: 08/19/2017 10:30 AM Performed by: Leonides GrillsELLENDER, RYAN P Pre-anesthesia Checklist: Patient identified, Emergency Drugs available, Suction available and Patient being monitored Patient Re-evaluated:Patient Re-evaluated prior to induction Oxygen Delivery Method: Circle system utilized Preoxygenation: Pre-oxygenation with 100% oxygen Induction Type: IV induction Ventilation: Mask ventilation without difficulty LMA: LMA inserted LMA Size: 5.0 Number of attempts: 1 Airway Equipment and Method: Bite block Placement Confirmation: positive ETCO2 Tube secured with: Tape Dental Injury: Teeth and Oropharynx as per pre-operative assessment

## 2017-08-23 ENCOUNTER — Encounter (HOSPITAL_BASED_OUTPATIENT_CLINIC_OR_DEPARTMENT_OTHER): Payer: Self-pay | Admitting: Urology

## 2017-08-29 IMAGING — CR DG ABDOMEN 1V
1 series · 1 of 1 positions shown · non-contrast
Comparison: 12/26/2016

CLINICAL DATA: Pt c/o left sided kidney stone. H/o previous kidney
stones with multiple cystoscopy/stent placements.

EXAM:
ABDOMEN - 1 VIEW

[t abdomen supine]
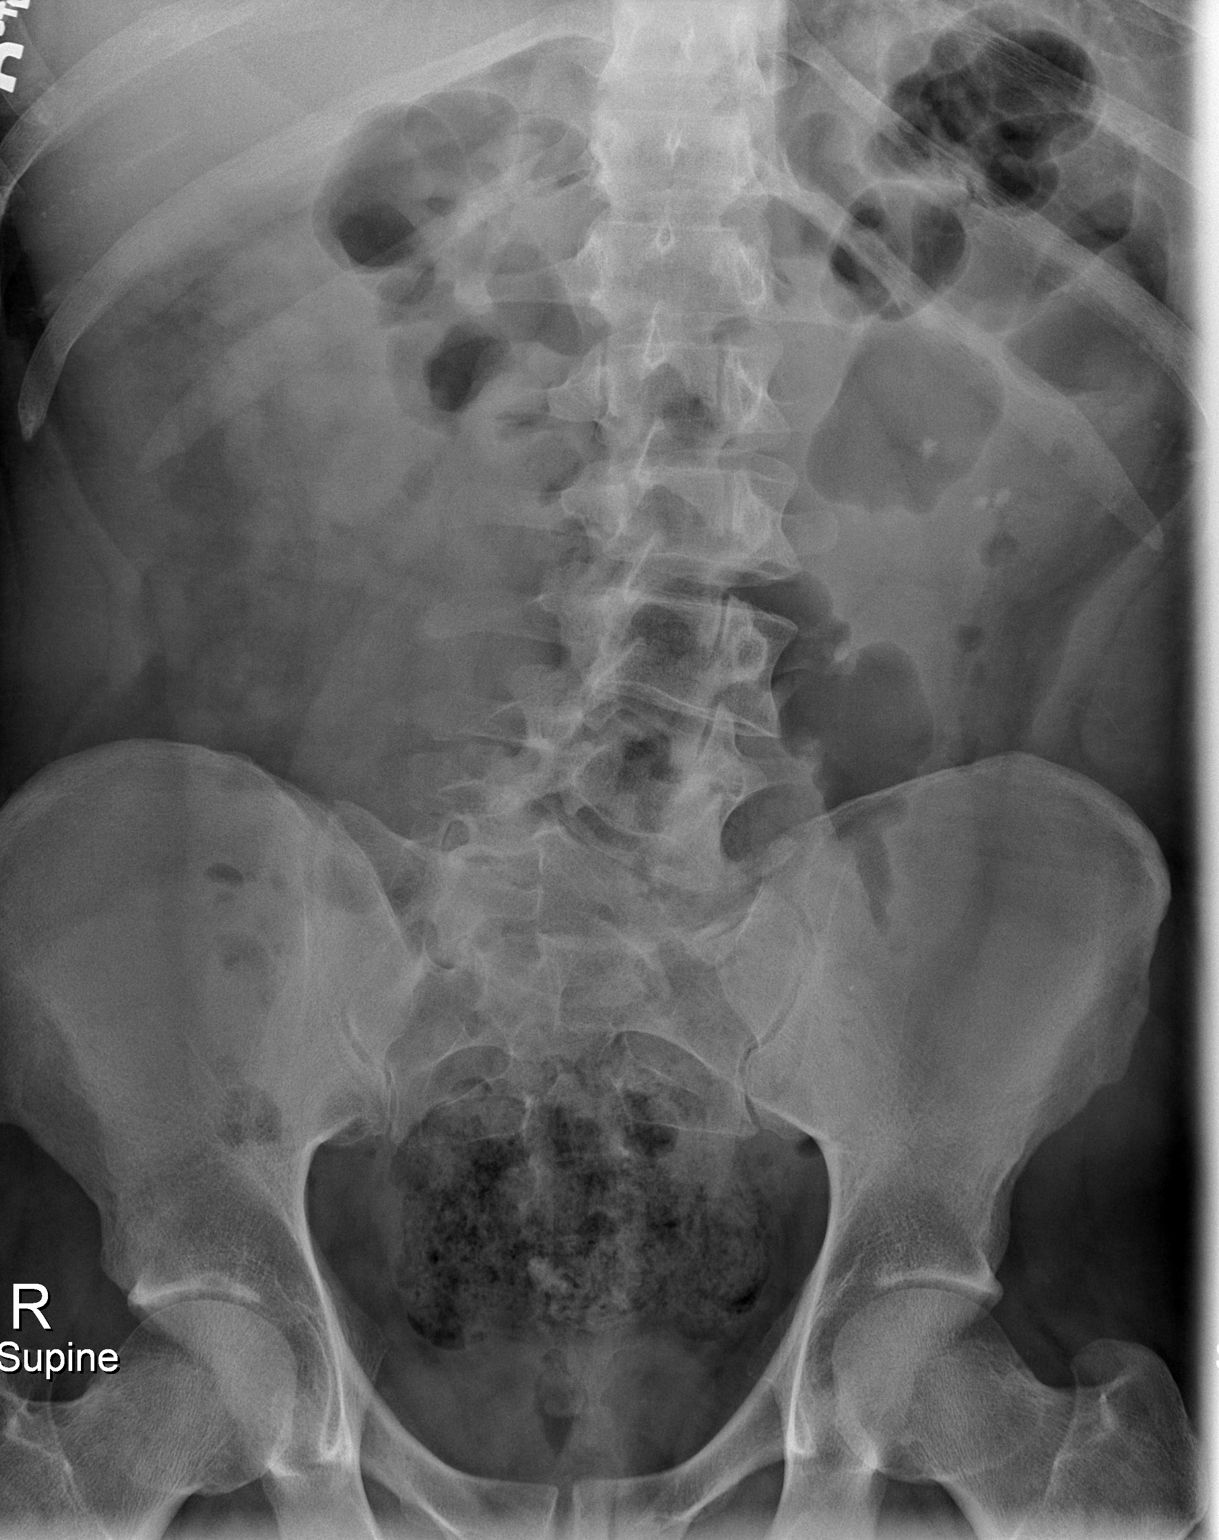

[1 of 1 positions shown; findings below may reference images not displayed]

FINDINGS: There are numerous calcifications overlying the expected location of
the lower pole the left kidney. Largest measures approximately 7 mm.
To the left of the L4 vertebral body, there is a rounded opacity,
possibly representing left ureteral stone or stones. This density
measures 6 mm.

Bowel gas pattern is nonobstructive. There is scoliosis of the
lumbar spine.
IMPRESSION: 1. Left intrarenal calcifications.
2. Possible left ureteral stones overlying the left transverse
process at L4.

## 2017-09-04 ENCOUNTER — Other Ambulatory Visit: Payer: Self-pay | Admitting: Neurology

## 2017-09-05 ENCOUNTER — Telehealth: Payer: Self-pay | Admitting: Neurology

## 2017-09-05 NOTE — Telephone Encounter (Signed)
Pt is wanting to schedule botox appt within the next few weeks. Please call

## 2017-09-06 NOTE — Telephone Encounter (Signed)
I called the patient to schedule

## 2017-09-21 DIAGNOSIS — N200 Calculus of kidney: Secondary | ICD-10-CM | POA: Diagnosis not present

## 2017-10-07 ENCOUNTER — Other Ambulatory Visit: Payer: Self-pay | Admitting: Neurology

## 2017-10-19 ENCOUNTER — Telehealth: Payer: Self-pay | Admitting: Neurology

## 2017-10-19 ENCOUNTER — Encounter: Payer: Self-pay | Admitting: Neurology

## 2017-10-19 ENCOUNTER — Ambulatory Visit (INDEPENDENT_AMBULATORY_CARE_PROVIDER_SITE_OTHER): Payer: BLUE CROSS/BLUE SHIELD | Admitting: Neurology

## 2017-10-19 DIAGNOSIS — G43709 Chronic migraine without aura, not intractable, without status migrainosus: Secondary | ICD-10-CM | POA: Diagnosis not present

## 2017-10-19 DIAGNOSIS — IMO0002 Reserved for concepts with insufficient information to code with codable children: Secondary | ICD-10-CM

## 2017-10-19 NOTE — Telephone Encounter (Signed)
Pt needs botox in 3 mos °

## 2017-10-19 NOTE — Progress Notes (Signed)
[facility]  PATIENT: Thomas Howell DOB: Nov 01, 1991  REFERRING DOCTOR OR PCP:  Ardyth Gal SOURCE: patient, records in EMR  _________________________________   HISTORICAL  CHIEF COMPLAINT:  Chief Complaint  Patient presents with  . Migraines    Botox 100u, 2 vials.  Buy and Bill.  Lot# [id number].  Exp. 04/2020.  NDC 0023-1145-01/fim    HISTORY OF PRESENT ILLNESS:  Thomas Howell  is a 26 yo man with chronic migraines  Since his last Botox 3 months ago, he reports a couple of headaches a month that are usually easy to eat with Maxalt. Last month he had 4 headaches.   He has had frequent migraine headaches since age 28.  Headaches have persisted despite multiple prophylactic regimens.    He started Botox 02/2017.   For the first 10 weeks, he had less than one migraine a week but has had a few the past 2 weeks.   When present, Tylenol sometimes helps and Maxalt helps if the Tylenol does not.   His neck pain also improved with the Botox.  Chronic Migraine History:   He was experiencing 20-24 headache days of lasting > 4 hours each day.   He also has milder occipital headache pain every day (30/30 a month).     A typical headache starts in the occiput and then radiates to the eye (usually on the right) with a pounding quality.      He has no preceding aura.   He gets nausea and vomiting.   Moving increases the headache and laying in bed in a dark quiet room helps.    He has associated phonophobia and photophobia. Most headaches are random but some are triggered by strong smells such as perfume.     Maxalt helps some of the migraine headaches.  Often he has to take 2 over the day to get rid of the headache .   He uses about 10/month often in combination with Aleve.   Phenergan helps nausea    If he falls asleep, pain is oftebetter upon awakening.      HA treatment History:  Topiramate had some benefit but cause kidney stones and he needed to stop. Amitriptyline caused  grogginess the next day.   Nortriptyline had not helped.    Keppra and lamotriine did not help the headache frequency much.  Aleve and other NSAID's by themself have not helped.      REVIEW OF SYSTEMS: Constitutional: No fevers, chills, sweats, or change in appetite Eyes: No visual changes, double vision, eye pain Ear, nose and throat: No hearing loss, ear pain, nasal congestion, sore throat Cardiovascular: No chest pain, palpitations Respiratory: No shortness of breath at rest or with exertion.   No wheezes GastrointestinaI: No nausea, vomiting, diarrhea, abdominal pain, fecal incontinence Genitourinary: No dysuria, urinary retention or frequency.  He has frequent kidney stones (sees Dr. Mena Goes at [facility]) Musculoskeletal: No neck pain, back pain Integumentary: No rash, pruritus, skin lesions Neurological: as above Psychiatric: No depression at this time.  No anxiety Endocrine: No palpitations, diaphoresis, change in appetite, change in weigh or increased thirst Hematologic/Lymphatic: No anemia, purpura, petechiae. Allergic/Immunologic: No itchy/runny eyes, nasal congestion, recent allergic reactions, rashes  ALLERGIES: Allergies  Allergen Reactions  . Bactrim [Sulfamethoxazole-Trimethoprim] Nausea And Vomiting  . Codeine Swelling    Lips swell//  This includes cough syrup w/ codeine  . Adhesive [Tape] Rash    Paper tape ok    HOME MEDICATIONS:  Current Outpatient Prescriptions:  .  BOTOX 100 units SOLR injection, INJECT IN THE MUSCLE TO HEAD AND NECK MUSCLES EVERY 3 MONTHS BY PROVIDER, Disp: 2 each, Rfl: 2 .  hydrochlorothiazide (HYDRODIURIL) 25 MG tablet, Take 25 mg by mouth 2 (two) times daily., Disp: , Rfl:  .  ibuprofen (ADVIL,MOTRIN) 200 MG tablet, Take 200 mg by mouth every 6 (six) hours as needed for headache or mild pain., Disp: , Rfl:  .  lamoTRIgine (LAMICTAL) 100 MG tablet, Take 1 tablet (100 mg total) by mouth daily. Take 1/2 po qd x 5 days, then 1/2  po bid x 5 d, then 1/2 po tid x 5d then 1 po bid (Patient taking differently: Take 100 mg by mouth 2 (two) times daily. ), Disp: 60 tablet, Rfl: 11 .  levETIRAcetam (KEPPRA) 750 MG tablet, TAKE 1 TABLET BY MOUTH EVERY MORNING AND 2 TABLETS EVERY NIGHT AT BEDTIME, Disp: 90 tablet, Rfl: 0 .  naproxen sodium (ANAPROX) 220 MG tablet, Take 440 mg by mouth 2 (two) times daily with a meal., Disp: , Rfl:  .  ondansetron (ZOFRAN) 4 MG tablet, Take 1 tablet (4 mg total) by mouth every 8 (eight) hours as needed for nausea or vomiting., Disp: 4 tablet, Rfl: 0 .  potassium chloride SA (KLOR-CON M15) 15 MEQ tablet, Take 15 mEq by mouth 2 (two) times daily. Pt takes 2 tabs, twice a day, Disp: , Rfl:  .  promethazine (PHENERGAN) 25 MG tablet, Take 1 tablet (25 mg total) by mouth every 6 (six) hours as needed for nausea or vomiting., Disp: 10 tablet, Rfl: 0 .  rizatriptan (MAXALT-MLT) 10 MG disintegrating tablet, Take 1 tablet (10 mg total) by mouth as needed for migraine. May repeat in 2 hours if needed; max 2 per 24 hrs, Disp: 10 tablet, Rfl: 6  PAST MEDICAL HISTORY: Past Medical History:  Diagnosis Date  . Complication of anesthesia   . Frequency-urgency syndrome   . History of kidney stones   . Hx of nausea and vomiting    d/t kidney stone  . Hx of sepsis 08/11/2017   due to kidney stone/ hydronephrosis  . Left ureteral stone   . Migraine   . Pain due to ureteral stent (HCC)   . PONV (postoperative nausea and vomiting)    none recently  . Renal calculi    bilateral per ct 07-26-2016  . Scoliosis of lumbar spine 1994   treated at [facility] until age 26  . Wears glasses     PAST SURGICAL HISTORY: Past Surgical History:  Procedure Laterality Date  . CYSTOSCOPY W/ RETROGRADES Right 07/06/2015   Procedure: CYSTOSCOPY WITH RETROGRADE PYELOGRAM;  Surgeon: Jerilee Field, MD;  Location: [facility];  Service: Urology;  Laterality: Right;  . CYSTOSCOPY W/ URETERAL STENT PLACEMENT Left 08/08/2015   Procedure:  CYSTOSCOPY WITH STENT REPLACEMENT;  Surgeon: Jerilee Field, MD;  Location: [facility];  Service: Urology;  Laterality: Left;  . CYSTOSCOPY W/ URETERAL STENT PLACEMENT Left 02/21/2017   Procedure: CYSTOSCOPY WITH RETROGRADE PYELOGRAM/URETERAL LEFT STENT PLACEMENT WITH  LASER;  Surgeon: Bjorn Pippin, MD;  Location: [facility];  Service: Urology;  Laterality: Left;  . CYSTOSCOPY W/ URETERAL STENT PLACEMENT Left 08/10/2017   Procedure: CYSTOSCOPY WITH RETROGRADE PYELOGRAM/URETERAL STENT PLACEMENT;  Surgeon: Heloise Purpura, MD;  Location: [facility];  Service: Urology;  Laterality: Left;  . CYSTOSCOPY WITH RETROGRADE PYELOGRAM, URETEROSCOPY AND STENT PLACEMENT Left 06/24/2014   Procedure: CYSTOSCOPY WITH RETROGRADE PYELOGRAM,  AND STENT PLACEMENT;  Surgeon: Magdalene Molly, MD;  Location:  [facility];  Service: Urology;  Laterality: Left;  . CYSTOSCOPY WITH RETROGRADE PYELOGRAM, URETEROSCOPY AND STENT PLACEMENT Left 07/05/2014   Procedure: CYSTO/LEFT URETEROSCOPY/LEFT RETROGRADE PYELOGRAM/LEFT STENT PLACEMENT;  Surgeon: Jerilee Field, MD;  Location: [facility];  Service: Urology;  Laterality: Left;  . CYSTOSCOPY WITH RETROGRADE PYELOGRAM, URETEROSCOPY AND STENT PLACEMENT Left 07/06/2015   Procedure: CYSTOSCOPY WITH RETROGRADE PYELOGRAM, URETEROSCOPY , LASER, STENT PLACEMENT and BASKET EXTRACTION;  Surgeon: Jerilee Field, MD;  Location: [facility];  Service: Urology;  Laterality: Left;  . CYSTOSCOPY WITH RETROGRADE PYELOGRAM, URETEROSCOPY AND STENT PLACEMENT Left 08/19/2015   Procedure: CYSTOSCOPY WITH RETROGRADE PYELOGRAM, URETEROSCOPY AND STENT PLACEMENT;  Surgeon: Jerilee Field, MD;  Location: [facility];  Service: Urology;  Laterality: Left;  . CYSTOSCOPY WITH URETEROSCOPY AND STENT PLACEMENT Left 01/16/2016   Procedure: CYSTOSCOPY WITH LEFT RETROGRADE PYELOGRAM  LEFT DIGITAL URETEROSCOPY AND PLACEMENT LEFT URETERAL STENT;  Surgeon: Jerilee Field, MD;  Location: [facility];  Service:  Urology;  Laterality: Left;  . CYSTOSCOPY WITH URETEROSCOPY, STONE BASKETRY AND STENT PLACEMENT Left 02/13/2016   Procedure: CYSTOSCOPY WITH LEFT URETEROSCOPY, HOLMIUM LASER AND STENT PLACEMENT;  Surgeon: Jerilee Field, MD;  Location: [facility];  Service: Urology;  Laterality: Left;  . CYSTOSCOPY/RETROGRADE/URETEROSCOPY/STONE EXTRACTION WITH BASKET Left 08/08/2015   Procedure: CYSTOSCOPY/RETROGRADE/URETEROSCOPY/STONE EXTRACTION WITH BASKET;  Surgeon: Jerilee Field, MD;  Location: [facility];  Service: Urology;  Laterality: Left;  . CYSTOSCOPY/URETEROSCOPY/HOLMIUM LASER/STENT PLACEMENT Left 07/30/2016   Procedure: CYSTOSCOPY/URETEROSCOPY/HOLMIUM LASER/STENT PLACEMENT;  Surgeon: Jerilee Field, MD;  Location: [facility];  Service: Urology;  Laterality: Left;  . CYSTOSCOPY/URETEROSCOPY/HOLMIUM LASER/STENT PLACEMENT Left 08/19/2017   Procedure: CYSTOSCOPY/URETEROSCOPY/HOLMIUM LASER/STENT PLACEMENT;  Surgeon: Jerilee Field, MD;  Location: [facility];  Service: Urology;  Laterality: Left;  . EXTRACORPOREAL SHOCK WAVE LITHOTRIPSY Left 07-07-2015  &  12-26-2014  . 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 HOLMIUM LASER APPLICATION Left 07/05/2014   Procedure: LASER LITHO;  Surgeon: Jerilee Field, MD;  Location: [facility];  Service: Urology;  Laterality: Left;  . HOLMIUM LASER APPLICATION Left 08/08/2015   Procedure: HOLMIUM LASER  WITH LITHOTRIPSY ;  Surgeon: Jerilee Field, MD;  Location: [facility];  Service: Urology;  Laterality: Left;  . HOLMIUM LASER APPLICATION Left 02/21/2017   Procedure: HOLMIUM LASER APPLICATION;  Surgeon: Bjorn Pippin, MD;  Location: [facility];  Service: Urology;  Laterality: Left;  . LYMPH GLAND EXCISION  2003   neck--  benign    FAMILY HISTORY: Family History  Problem Relation Age of Onset  . Cancer Father   . Kidney Stones  Mother   . Cancer Other   . Hypertension Other   . Hyperlipidemia Other   . Stroke Other   . Kidney Stones Brother     SOCIAL HISTORY:  Social History   Social History  . Marital status: Single    Spouse name: N/A  . Number of children: N/A  . Years of education: N/A   Occupational History  . Not on file.   Social History Main Topics  . Smoking status: Former Smoker    Years: 2.00    Quit date: 07/29/2014  . Smokeless tobacco: Never Used     Comment: social smoker  . Alcohol use 0.0 oz/week     Comment: occ  . Drug use: No  . Sexual activity: No   Other Topics Concern  . Not on file   Social History Narrative  .  No narrative on file     PHYSICAL EXAM  Vitals:   10/19/17 1450  BP: 118/81  Pulse: 100  Resp: 16  Weight: 240 lb 8 oz (109.1 kg)  Height: 5\' 8"  (1.727 m)    Body mass index is 36.57 kg/m.   General: The patient is well-developed and well-nourished and in no acute distress  Neck:    The neck is slightly tender over the right splenius capitis muscle .  Neurologic Exam  Mental status: The patient is alert and oriented x 3 at the time of the examination. The patient has apparent normal recent and remote memory, with an apparently normal attention span and concentration ability.   Speech is normal.  Cranial nerves: Extraocular movements are full.  Facial strength is normal..     Motor:  Muscle bulk is normal.   Tone is normal. Strength is  5 / 5 in all 4 extremities.    Gait and station: Station is normal.   Gait is normal.         DIAGNOSTIC DATA (LABS, IMAGING, TESTING) - I reviewed patient records, labs, notes, testing and imaging myself where available.  Lab Results  Component Value Date   WBC 16.8 (H) 08/12/2017   HGB 13.4 08/12/2017   HCT 37.2 (L) 08/12/2017   MCV 85.1 08/12/2017   PLT 160 08/12/2017      Component Value Date/Time   NA 139 08/12/2017 0306   K 4.0 08/12/2017 0306   CL 108 08/12/2017 0306   CO2 26  08/12/2017 0306   GLUCOSE 139 (H) 08/12/2017 0306   BUN 18 08/12/2017 0306   CREATININE 1.23 08/12/2017 0306   CALCIUM 8.5 (L) 08/12/2017 0306   PROT 7.8 12/26/2016 1601   ALBUMIN 4.3 12/26/2016 1601   AST 37 12/26/2016 1601   ALT 40 12/26/2016 1601   ALKPHOS 57 12/26/2016 1601   BILITOT 0.5 12/26/2016 1601   GFRNONAA >60 08/12/2017 0306   GFRAA >60 08/12/2017 0306       ASSESSMENT AND PLAN  Chronic migraine    1.  Botox 190 u:  Frontalis (5 units 6), temporalis (5 units 8), occipitalis (5 units 6), corrugators and procerus (5 units 3), splenius capitis (15 units left and 20 U right), trapezius (10 units 2), C6-C7 paraspinal muscles (10 units 2), wasted 15 units.     Office supply   [phone] 2.   Continue prn Maxalt. 3.    Return for Botox in 3 months or call sooner if there are significant changes in the headache frequency or other new neurologic symptoms.   We will consider tapering lamotrigine or Keppraat the next visit.   Nalu Troublefield A. Epimenio FootSater, MD, PhD 10/19/2017, 3:11 PM Certified in Neurology, Clinical Neurophysiology, Sleep Medicine, Pain Medicine and Neuroimaging  [facility] [address], [address] [place], [place] [address] [phone]

## 2017-10-24 NOTE — Telephone Encounter (Signed)
I called to scheduled the patient but he did not answer so I left a VM asking him to call me back.

## 2017-10-27 ENCOUNTER — Other Ambulatory Visit: Payer: Self-pay | Admitting: Neurology

## 2017-11-24 ENCOUNTER — Other Ambulatory Visit: Payer: Self-pay | Admitting: Neurology

## 2017-11-25 ENCOUNTER — Other Ambulatory Visit: Payer: Self-pay

## 2017-11-25 MED ORDER — LAMOTRIGINE 100 MG PO TABS: ORAL_TABLET | ORAL | 0 refills | Status: DC

## 2017-11-25 MED ORDER — LEVETIRACETAM 750 MG PO TABS: ORAL_TABLET | ORAL | 0 refills | Status: DC

## 2017-12-15 DIAGNOSIS — Z79899 Other long term (current) drug therapy: Secondary | ICD-10-CM | POA: Diagnosis not present

## 2017-12-15 DIAGNOSIS — G43909 Migraine, unspecified, not intractable, without status migrainosus: Secondary | ICD-10-CM | POA: Diagnosis not present

## 2017-12-16 ENCOUNTER — Telehealth: Payer: Self-pay | Admitting: Diagnostic Neuroimaging

## 2017-12-16 NOTE — Telephone Encounter (Signed)
Incoming call re: botox scheduling and delivery. I tried calling but no one answered.  Will forward to Daniell, Faith and Dr. Epimenio FootSater for review next week.     Suanne MarkerVIKRAM R. Tauni Sanks, MD 12/16/2017, 12:20 PM Certified in Neurology, Neurophysiology and Neuroimaging  [facility] [address], [address] [place], [place] [address] [phone]

## 2017-12-21 NOTE — Telephone Encounter (Signed)
Noted/fim 

## 2017-12-21 NOTE — Telephone Encounter (Signed)
I called the pharmacy and spoke with them, the patient does not have a current apt. So the rx is being held until he schedules.

## 2018-01-03 ENCOUNTER — Other Ambulatory Visit: Payer: Self-pay | Admitting: Neurology

## 2018-02-14 IMAGING — CT CT RENAL STONE PROTOCOL
2 of 4 series · 16 of 46 positions shown, 18 images · non-contrast
Comparison: Abdominal radiograph June 01, 2017 and CT abdomen and
pelvis July 26, 2016

CLINICAL DATA: LEFT flank pain beginning last night. History of
kidney stones and stent placement.

EXAM:
CT ABDOMEN AND PELVIS WITHOUT CONTRAST
TECHNIQUE: Multidetector CT imaging of the abdomen and pelvis was performed
following the standard protocol without IV contrast.

[Series 2: axial st · axial · 0.73mm/px · z∈[+967,+1412]mm · 13 of 99 slices shown, 15 images]
[im 5/99  soft-tissue]
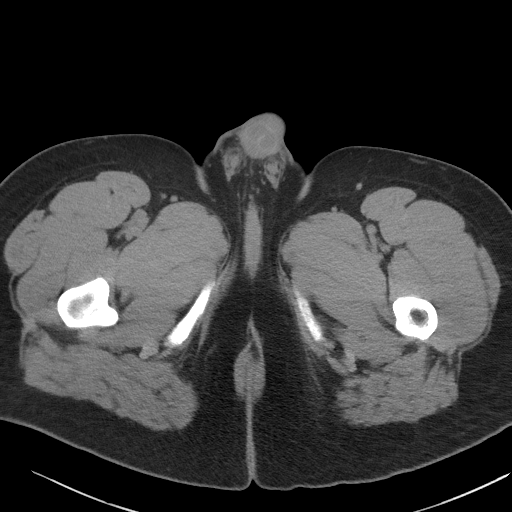
[im 5/99  bone]
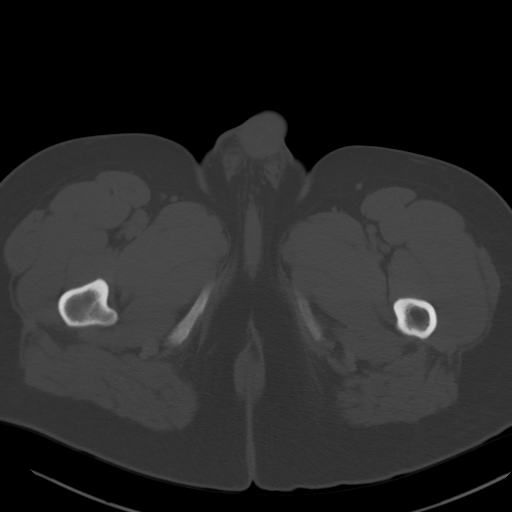
[im 13/99  soft-tissue]
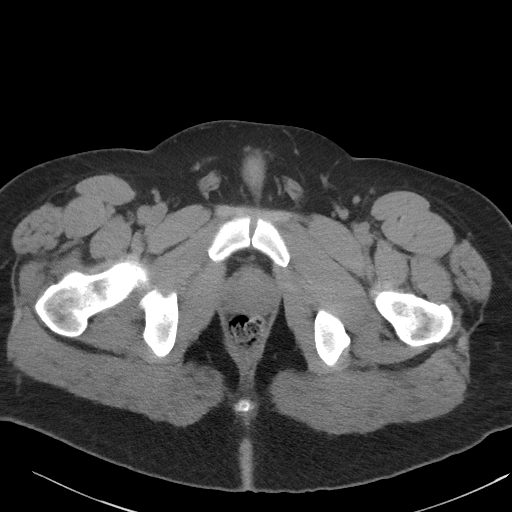
[im 21/99  soft-tissue]
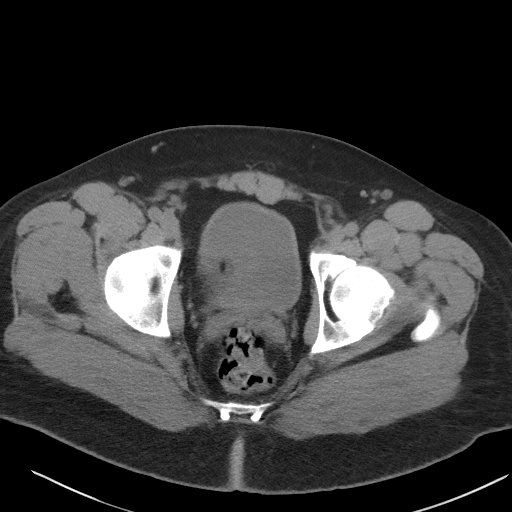
[im 29/99  soft-tissue]
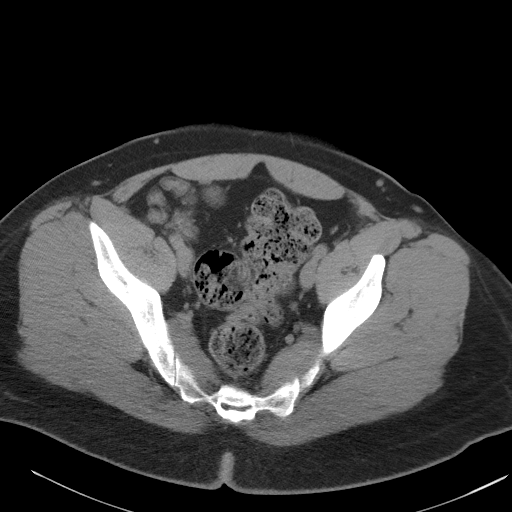
[im 33/99  soft-tissue]
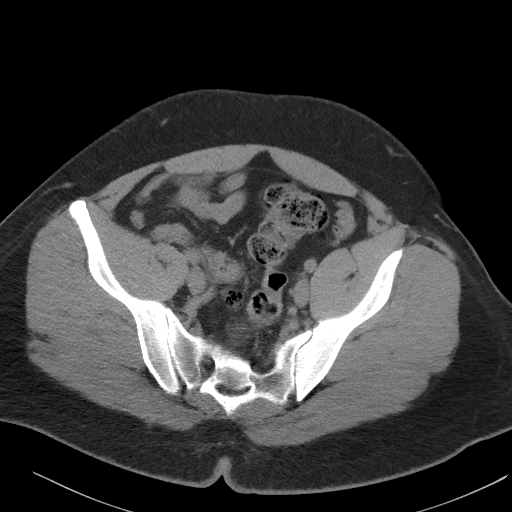
[im 41/99  soft-tissue]
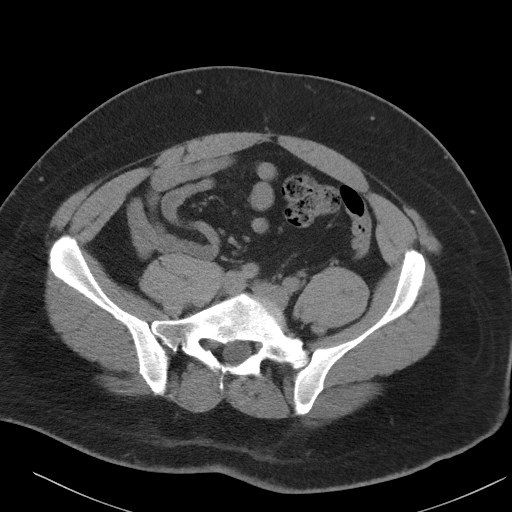
[im 50/99  soft-tissue]
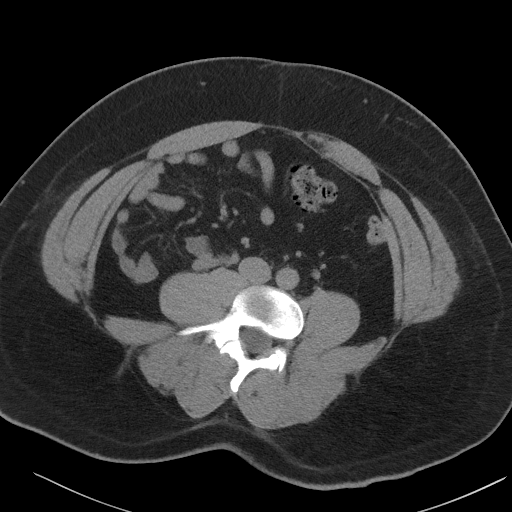
[im 58/99  soft-tissue]
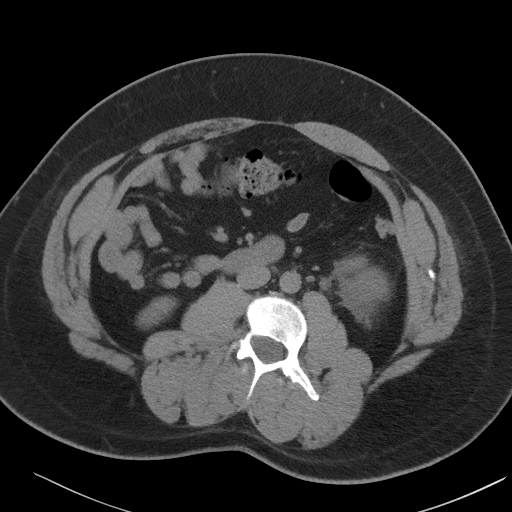
[im 66/99  soft-tissue]
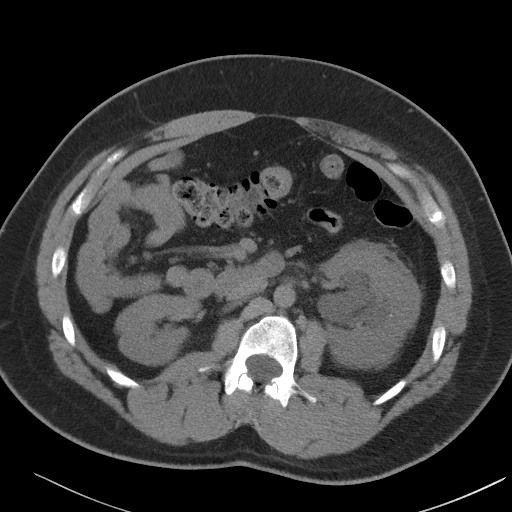
[im 66/99  bone]
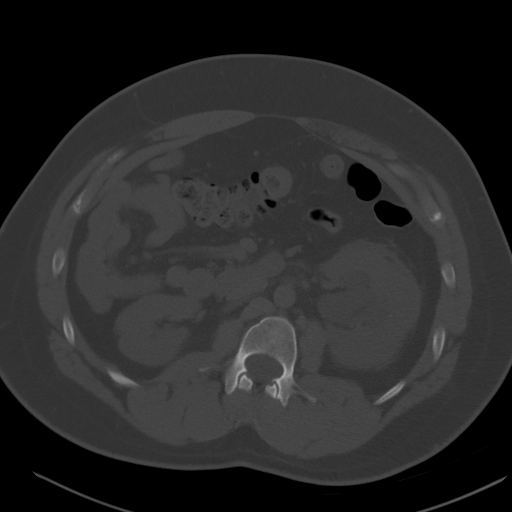
[im 70/99  soft-tissue]
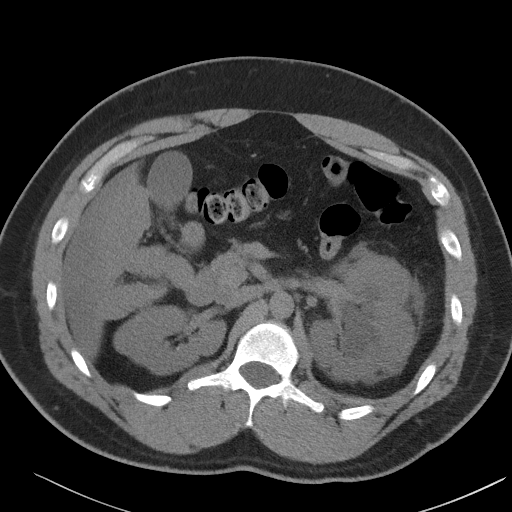
[im 78/99  soft-tissue]
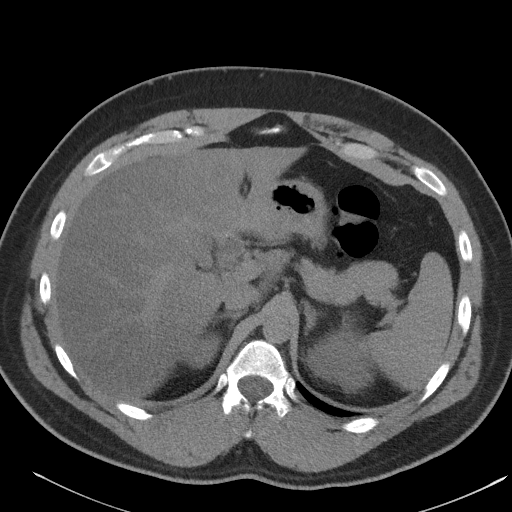
[im 86/99  soft-tissue]
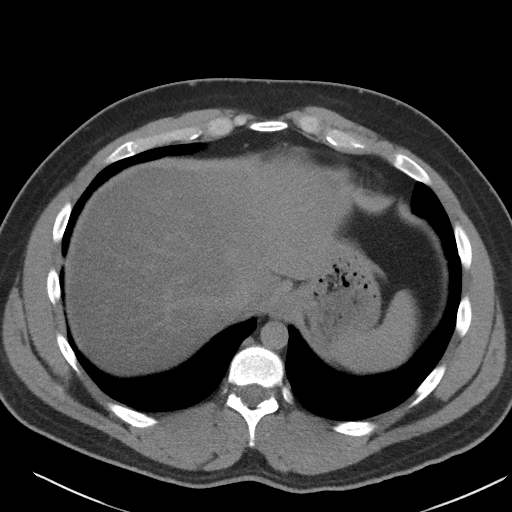
[im 94/99  soft-tissue]
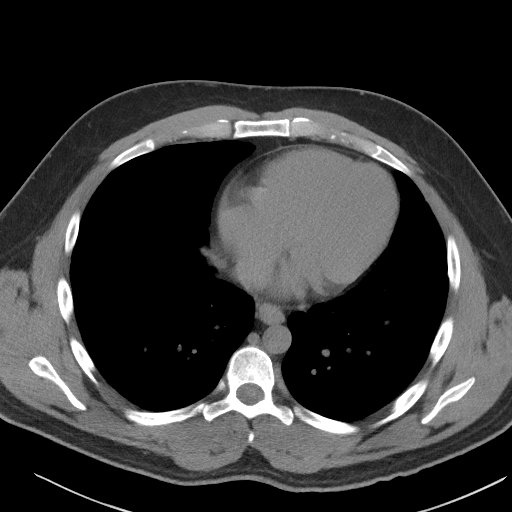

[Series 5: coronal st · coronal · 0.76mm/px · 3 of 104 slices shown]
[im 35/104  soft-tissue]
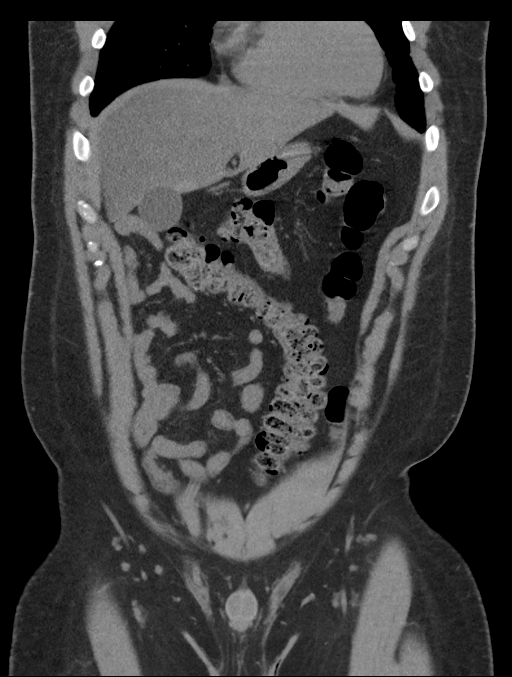
[im 46/104  soft-tissue]
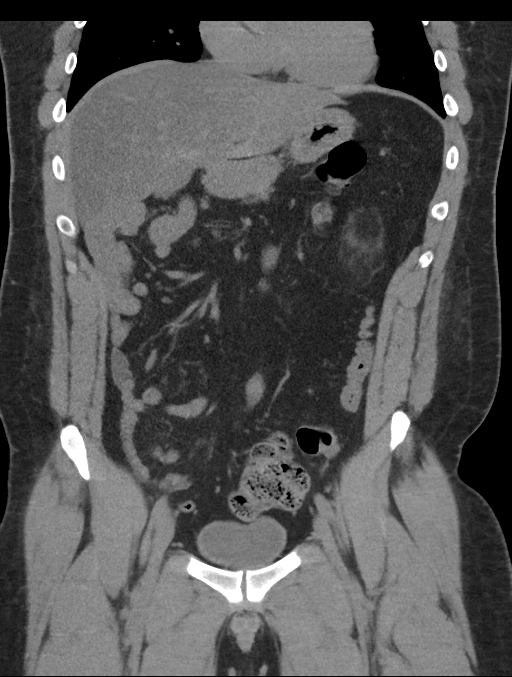
[im 58/104  soft-tissue]
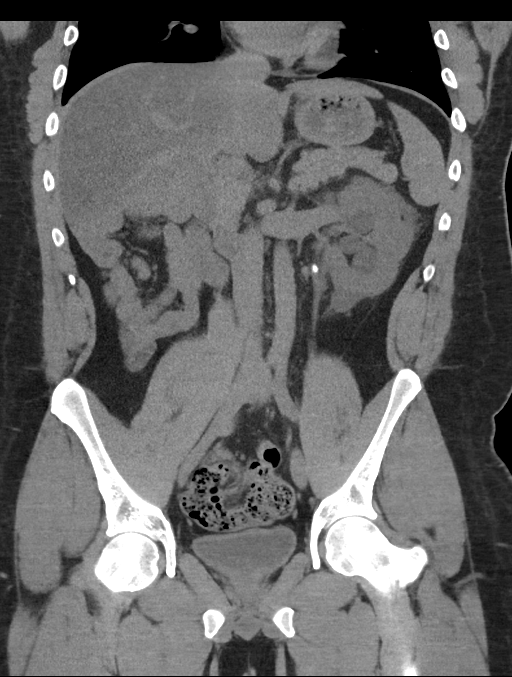

[16 of 46 positions shown; findings below may reference images not displayed]

FINDINGS: LOWER CHEST: Lung bases are clear. The visualized heart size is
normal. No pericardial effusion.

HEPATOBILIARY: Extremely hypodense liver compatible with steatosis,
focal fatty sparing about the gallbladder fossa. Normal gallbladder.

PANCREAS: Normal.

SPLEEN: Normal.

ADRENALS/URINARY TRACT: Kidneys are orthotopic, demonstrating normal
size and morphology. Moderate LEFT hydronephrosis to the ureteral
pelvic junction where a 6 mm calculus is present. Numerous LEFT
nephrolithiasis measuring to 7 mm LEFT lower pole. Punctate RIGHT
nephrolithiasis without hydronephrosis. LEFT perinephric fat
stranding and trace effusion. Limited assessment for renal masses on
this nonenhanced examination. Urinary bladder is partially distended
and unremarkable. Normal adrenal glands.

STOMACH/BOWEL: The stomach, small and large bowel are normal in
course and caliber without inflammatory changes, sensitivity
decreased by lack of enteric contrast. Mild colonic diverticulosis.
Medial course of ascending colon, pelvic cecum. Punctate
appendicolith, no acute inflammatory changes.

VASCULAR/LYMPHATIC: Aortoiliac vessels are normal in course and
caliber. No lymphadenopathy by CT size criteria.

REPRODUCTIVE: Normal.

OTHER: No intraperitoneal free fluid or free air.

MUSCULOSKELETAL: Non-acute.  Broad levoscoliosis.
IMPRESSION: 1. 6 mm LEFT UPJ stone resulting in moderate LEFT hydronephrosis.
2. Bilateral nephrolithiasis, nonobstructing on the RIGHT.
3. Hepatic steatosis.

## 2018-02-25 IMAGING — DX DG CHEST 2V
2 series · 2 of 2 positions shown · non-contrast
Comparison: None

CLINICAL DATA: Preop exam.

EXAM:
CHEST  2 VIEW

[chest pa]
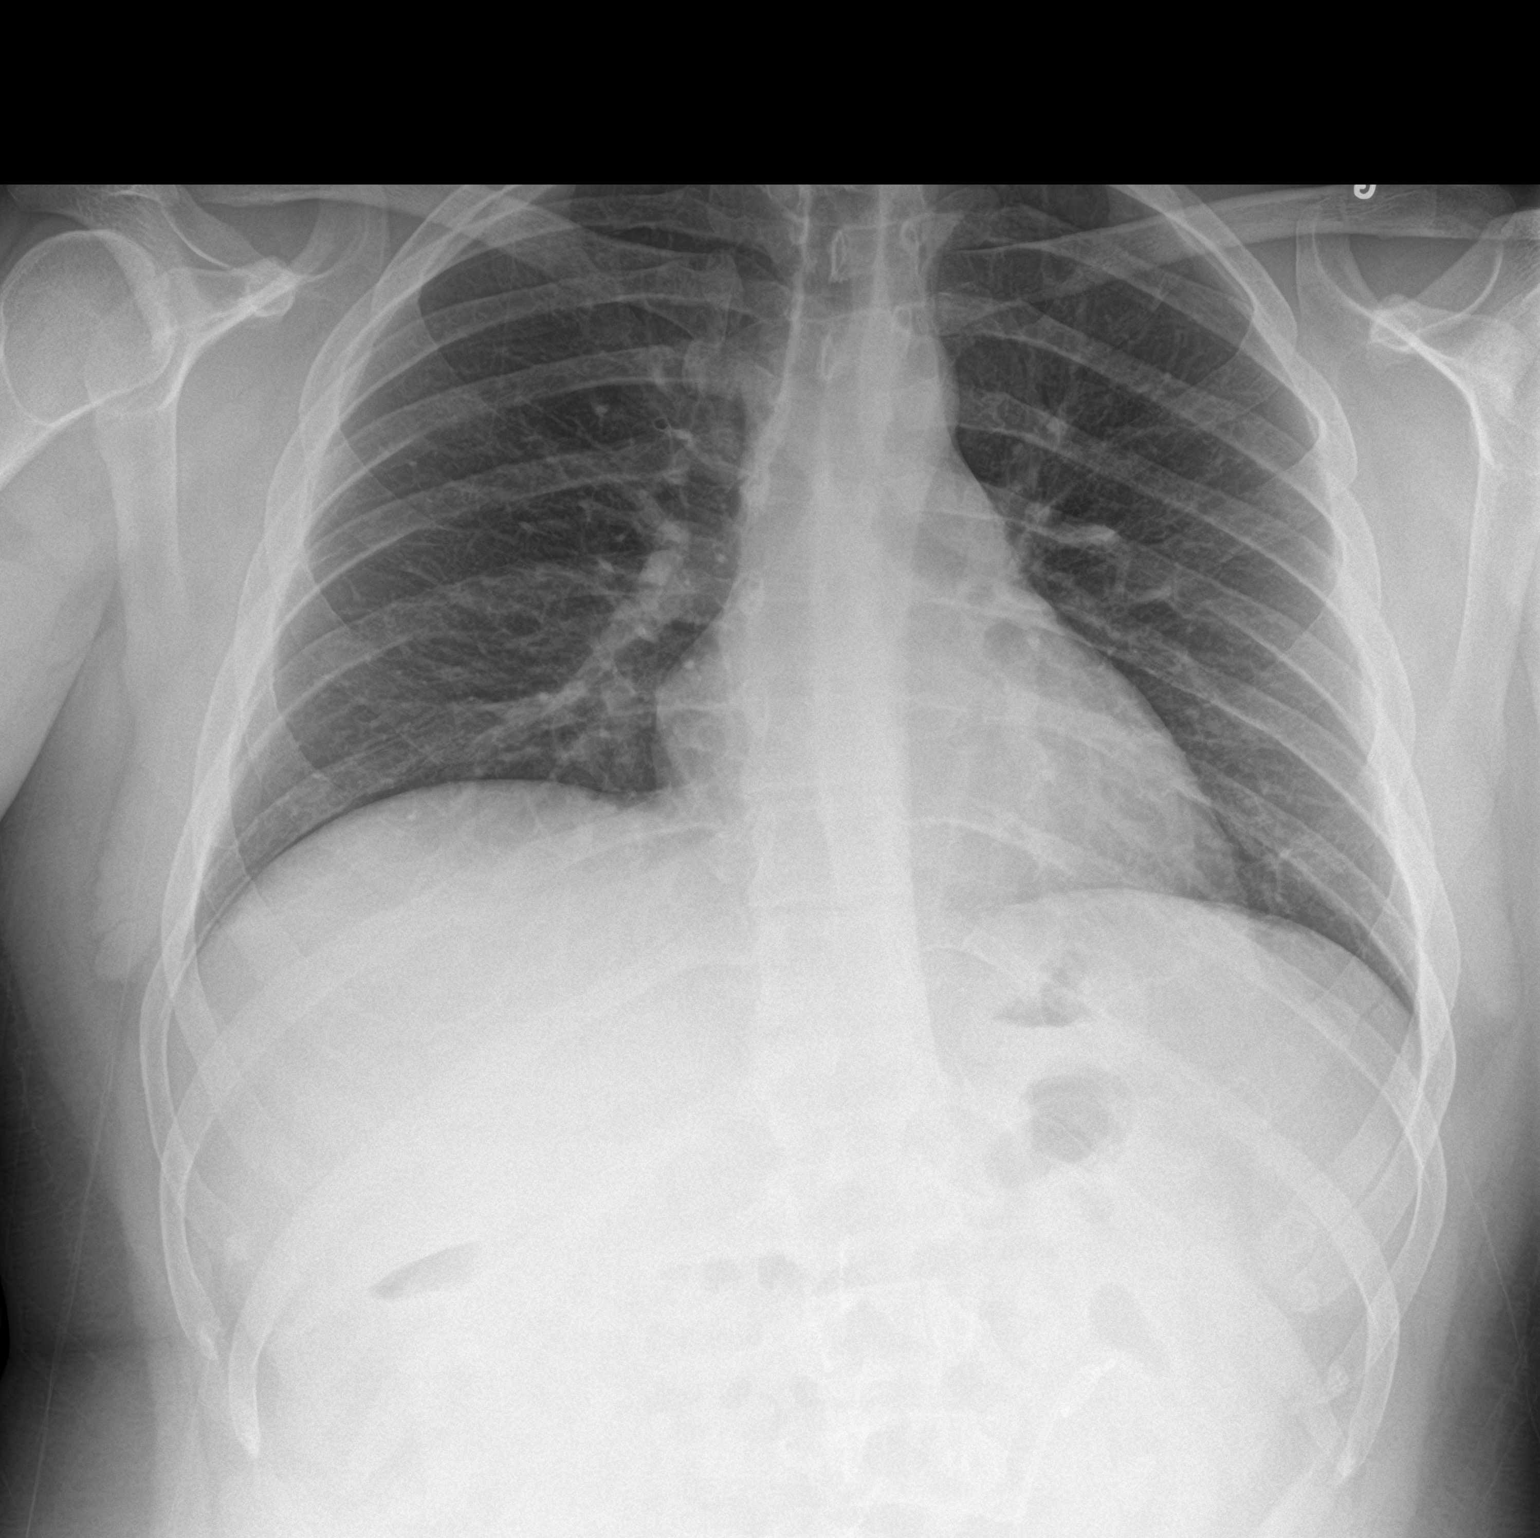

[chest lat]
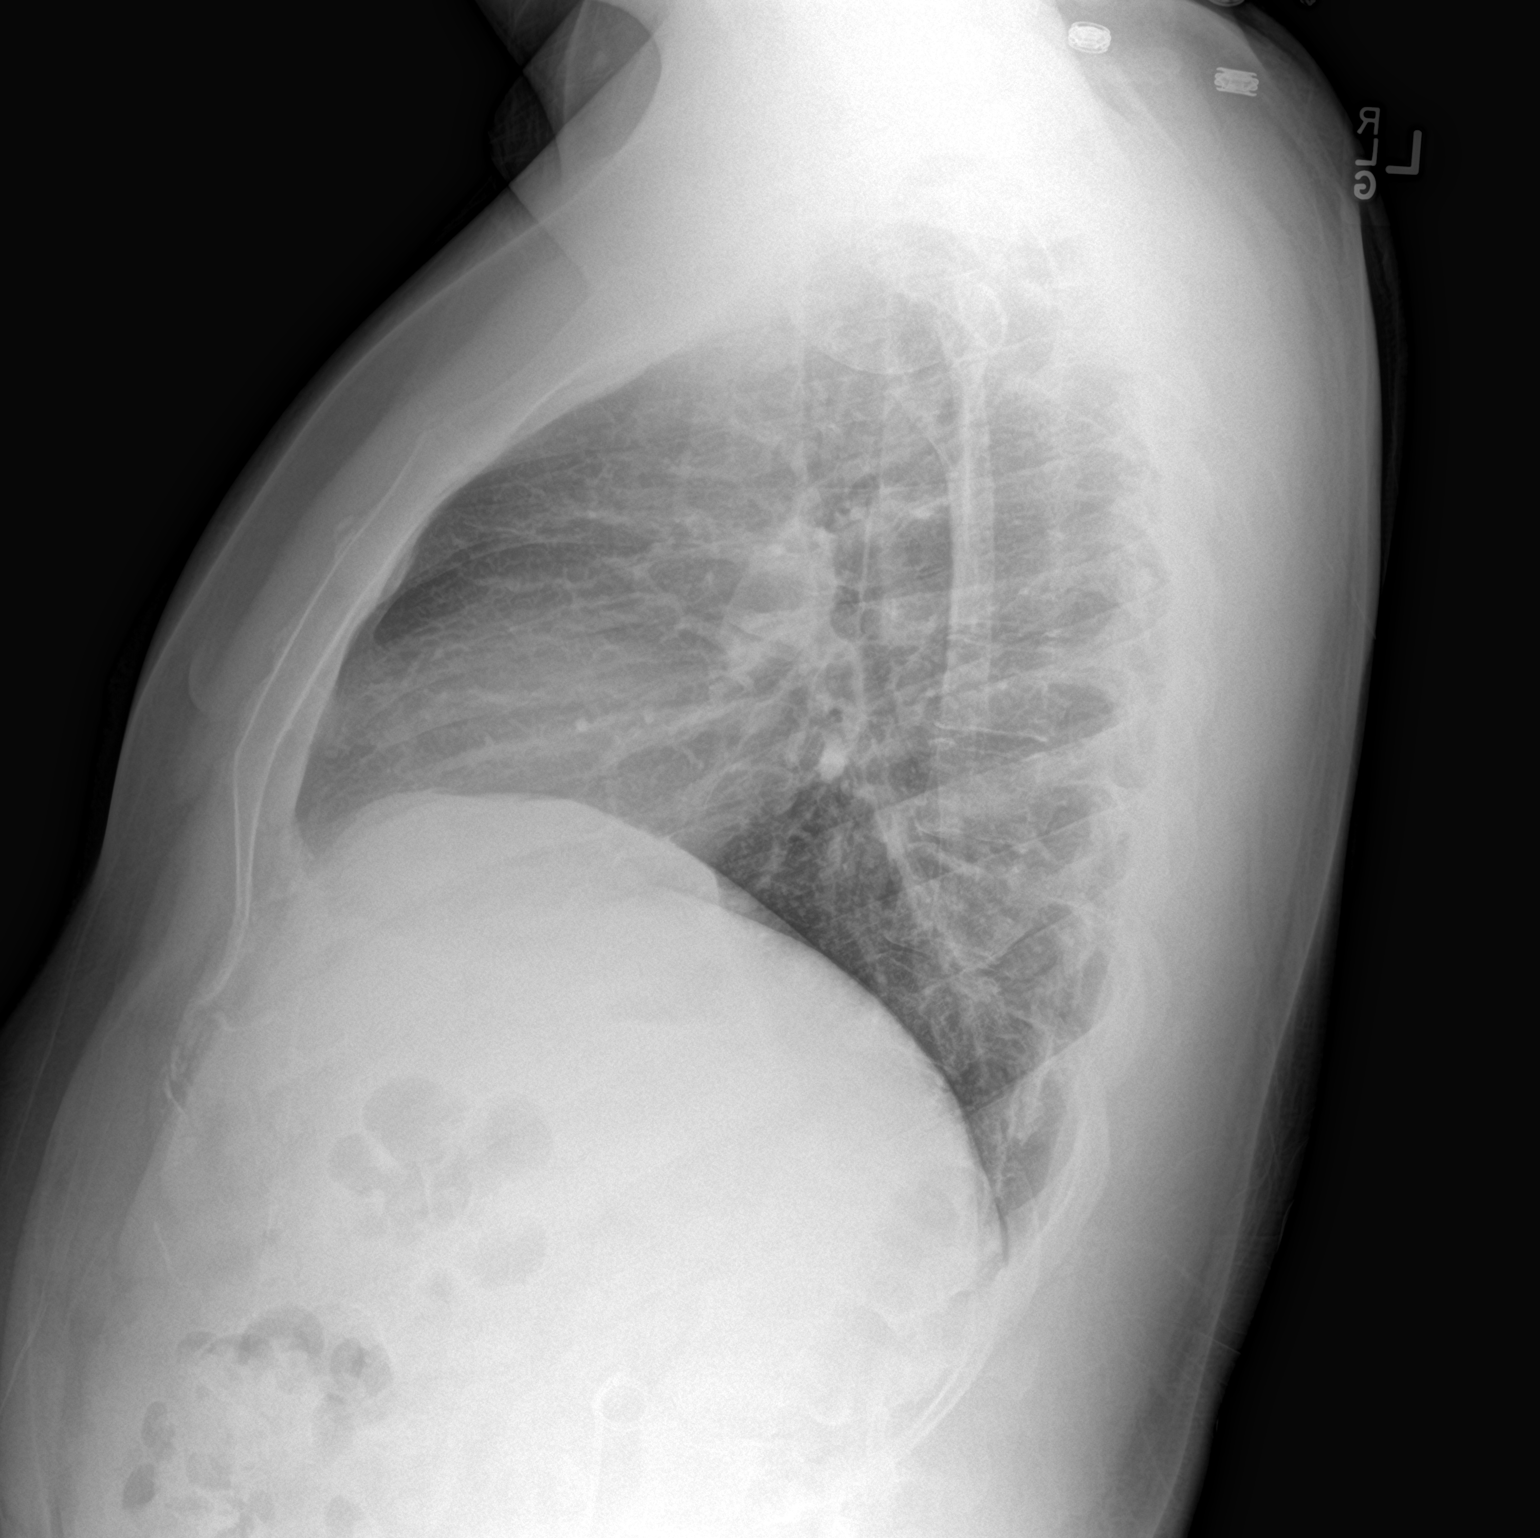

[2 of 2 positions shown; findings below may reference images not displayed]

FINDINGS: The heart size and mediastinal contours are within normal limits.
Both lungs are clear. Scoliosis deformity noted.
IMPRESSION: No active cardiopulmonary disease.

## 2018-03-02 ENCOUNTER — Ambulatory Visit (INDEPENDENT_AMBULATORY_CARE_PROVIDER_SITE_OTHER): Payer: BLUE CROSS/BLUE SHIELD | Admitting: Neurology

## 2018-03-02 ENCOUNTER — Telehealth: Payer: Self-pay | Admitting: Neurology

## 2018-03-02 ENCOUNTER — Other Ambulatory Visit: Payer: Self-pay

## 2018-03-02 ENCOUNTER — Encounter: Payer: Self-pay | Admitting: Neurology

## 2018-03-02 DIAGNOSIS — G43709 Chronic migraine without aura, not intractable, without status migrainosus: Secondary | ICD-10-CM | POA: Diagnosis not present

## 2018-03-02 DIAGNOSIS — IMO0002 Reserved for concepts with insufficient information to code with codable children: Secondary | ICD-10-CM

## 2018-03-02 MED ORDER — RIZATRIPTAN BENZOATE 10 MG PO TBDP: ORAL_TABLET | ORAL | 3 refills | Status: DC

## 2018-03-02 MED ORDER — LAMOTRIGINE 100 MG PO TABS: ORAL_TABLET | ORAL | 3 refills | Status: DC

## 2018-03-02 MED ORDER — LEVETIRACETAM 750 MG PO TABS: ORAL_TABLET | ORAL | 3 refills | Status: DC

## 2018-03-02 NOTE — Telephone Encounter (Signed)
3 month botox °

## 2018-03-02 NOTE — Progress Notes (Signed)
[facility]  PATIENT: Thomas Howell DOB: 1991/06/30  REFERRING DOCTOR OR PCP:  Ardyth Gal SOURCE: patient, records in EMR  _________________________________   HISTORICAL  CHIEF COMPLAINT:  Chief Complaint  Patient presents with  . Migraines    Botox 200u, 1 vial, Buy and Bill.  Lot# [id number] Exp. 05/2020.  [facility] [phone].  Would like to discuss stopping Keppra and Lamictal and resuming Topamax.  Has continued to have kidney stones, so he doesn't think Topamax was causing this, and sts. h/a's were better controlled on Topamax/fim    HISTORY OF PRESENT ILLNESS:  Thomas Howell  is a 27 yo man with chronic migraines  Update 03/02/2018: He continues to receive a benefit form Botox --- the benefit has been less the last month but he has not had botox for 4 1/2 months.  He has less than one a week during the first 3 months of Botox.  They are treated with good benefit using Maxalt.Marland Kitchen    He tolerates the Botox injections well  Besides Botox, he is also on Keppra and lamotrigine and feels that they help some.      From 10/19/2017: Since his last Botox 3 months ago, he reports a couple of headaches a month that are usually easy to eat with Maxalt. Last month he had 4 headaches.   He has had frequent migraine headaches since age 89.  Headaches have persisted despite multiple prophylactic regimens.    He started Botox 02/2017.   For the first 10 weeks, he had less than one migraine a week but has had a few the past 2 weeks.   When present, Tylenol sometimes helps and Maxalt helps if the Tylenol does not.   His neck pain also improved with the Botox.  Chronic Migraine History:   He was experiencing 20-24 headache days of lasting > 4 hours each day.   He also has milder occipital headache pain every day (30/30 a month).     A typical headache starts in the occiput and then radiates to the eye (usually on the right) with a pounding quality.      He has no preceding  aura.   He gets nausea and vomiting.   Moving increases the headache and laying in bed in a dark quiet room helps.    He has associated phonophobia and photophobia. Most headaches are random but some are triggered by strong smells such as perfume.     Maxalt helps some of the migraine headaches.  Often he has to take 2 over the day to get rid of the headache .   He uses about 10/month often in combination with Aleve.   Phenergan helps nausea    If he falls asleep, pain is oftebetter upon awakening.      HA treatment History:  Topiramate had some benefit but cause kidney stones and he needed to stop. Amitriptyline caused grogginess the next day.   Nortriptyline had not helped.    Keppra and lamotriine did not help the headache frequency much.  Aleve and other NSAID's by themself have not helped.      REVIEW OF SYSTEMS: Constitutional: No fevers, chills, sweats, or change in appetite Eyes: No visual changes, double vision, eye pain Ear, nose and throat: No hearing loss, ear pain, nasal congestion, sore throat Cardiovascular: No chest pain, palpitations Respiratory: No shortness of breath at rest or with exertion.   No wheezes GastrointestinaI: No nausea, vomiting, diarrhea, abdominal pain, fecal incontinence Genitourinary: No  dysuria, urinary retention or frequency.  He has frequent kidney stones (sees Dr. Mena Goes at [facility]) Musculoskeletal: No neck pain, back pain Integumentary: No rash, pruritus, skin lesions Neurological: as above Psychiatric: No depression at this time.  No anxiety Endocrine: No palpitations, diaphoresis, change in appetite, change in weigh or increased thirst Hematologic/Lymphatic: No anemia, purpura, petechiae. Allergic/Immunologic: No itchy/runny eyes, nasal congestion, recent allergic reactions, rashes  ALLERGIES: Allergies  Allergen Reactions  . Bactrim [Sulfamethoxazole-Trimethoprim] Nausea And Vomiting  . Codeine Swelling    Lips swell//  This  includes cough syrup w/ codeine  . Adhesive [Tape] Rash    Paper tape ok    HOME MEDICATIONS:  Current Outpatient Medications:  .  BOTOX 100 units SOLR injection, INJECT IN THE MUSCLE TO HEAD AND NECK MUSCLES EVERY 3 MONTHS BY PROVIDER, Disp: 2 each, Rfl: 2 .  hydrochlorothiazide (HYDRODIURIL) 25 MG tablet, Take 25 mg by mouth 2 (two) times daily., Disp: , Rfl:  .  ibuprofen (ADVIL,MOTRIN) 200 MG tablet, Take 200 mg by mouth every 6 (six) hours as needed for headache or mild pain., Disp: , Rfl:  .  lamoTRIgine (LAMICTAL) 100 MG tablet, One po bid, Disp: 180 tablet, Rfl: 3 .  levETIRAcetam (KEPPRA) 750 MG tablet, TAKE 1 TABLET BY MOUTH EVERY MORNING AND 2 TABLETS EVERY NIGHT AT BEDTIME, Disp: 270 tablet, Rfl: 3 .  naproxen sodium (ANAPROX) 220 MG tablet, Take 440 mg by mouth 2 (two) times daily with a meal., Disp: , Rfl:  .  potassium chloride SA (KLOR-CON M15) 15 MEQ tablet, Take 15 mEq by mouth 2 (two) times daily. Pt takes 2 tabs, twice a day, Disp: , Rfl:  .  rizatriptan (MAXALT-MLT) 10 MG disintegrating tablet, DISSOLVE 1 TABLET ON THE TONGUE EVERY DAY AS NEEDED FOR MIGRAINE. MAY REPEAT IN 2 HOURS IF NEEDED. MAX OF 2 TABLETS PER DAY, Disp: 30 tablet, Rfl: 3 .  ondansetron (ZOFRAN) 4 MG tablet, Take 1 tablet (4 mg total) by mouth every 8 (eight) hours as needed for nausea or vomiting., Disp: 4 tablet, Rfl: 0 .  promethazine (PHENERGAN) 25 MG tablet, Take 1 tablet (25 mg total) by mouth every 6 (six) hours as needed for nausea or vomiting., Disp: 10 tablet, Rfl: 0  PAST MEDICAL HISTORY: Past Medical History:  Diagnosis Date  . Complication of anesthesia   . Frequency-urgency syndrome   . History of kidney stones   . Hx of nausea and vomiting    d/t kidney stone  . Hx of sepsis 08/11/2017   due to kidney stone/ hydronephrosis  . Left ureteral stone   . Migraine   . Pain due to ureteral stent (HCC)   . PONV (postoperative nausea and vomiting)    none recently  . Renal calculi     bilateral per ct 07-26-2016  . Scoliosis of lumbar spine 1994   treated at [facility] until age 29  . Wears glasses     PAST SURGICAL HISTORY: Past Surgical History:  Procedure Laterality Date  . CYSTOSCOPY W/ RETROGRADES Right 07/06/2015   Procedure: CYSTOSCOPY WITH RETROGRADE PYELOGRAM;  Surgeon: Jerilee Field, MD;  Location: [facility];  Service: Urology;  Laterality: Right;  . CYSTOSCOPY W/ URETERAL STENT PLACEMENT Left 08/08/2015   Procedure: CYSTOSCOPY WITH STENT REPLACEMENT;  Surgeon: Jerilee Field, MD;  Location: [facility];  Service: Urology;  Laterality: Left;  . CYSTOSCOPY W/ URETERAL STENT PLACEMENT Left 02/21/2017   Procedure: CYSTOSCOPY WITH RETROGRADE PYELOGRAM/URETERAL LEFT STENT PLACEMENT WITH  LASER;  Surgeon: Bjorn Pippin, MD;  Location: [facility];  Service: Urology;  Laterality: Left;  . CYSTOSCOPY W/ URETERAL STENT PLACEMENT Left 08/10/2017   Procedure: CYSTOSCOPY WITH RETROGRADE PYELOGRAM/URETERAL STENT PLACEMENT;  Surgeon: Heloise Purpura, MD;  Location: [facility];  Service: Urology;  Laterality: Left;  . CYSTOSCOPY WITH RETROGRADE PYELOGRAM, URETEROSCOPY AND STENT PLACEMENT Left 06/24/2014   Procedure: CYSTOSCOPY WITH RETROGRADE PYELOGRAM,  AND STENT PLACEMENT;  Surgeon: Magdalene Molly, MD;  Location: [facility];  Service: Urology;  Laterality: Left;  . CYSTOSCOPY WITH RETROGRADE PYELOGRAM, URETEROSCOPY AND STENT PLACEMENT Left 07/05/2014   Procedure: CYSTO/LEFT URETEROSCOPY/LEFT RETROGRADE PYELOGRAM/LEFT STENT PLACEMENT;  Surgeon: Jerilee Field, MD;  Location: [facility];  Service: Urology;  Laterality: Left;  . CYSTOSCOPY WITH RETROGRADE PYELOGRAM, URETEROSCOPY AND STENT PLACEMENT Left 07/06/2015   Procedure: CYSTOSCOPY WITH RETROGRADE PYELOGRAM, URETEROSCOPY , LASER, STENT PLACEMENT and BASKET EXTRACTION;  Surgeon: Jerilee Field, MD;  Location: [facility];  Service: Urology;  Laterality: Left;  . CYSTOSCOPY WITH RETROGRADE PYELOGRAM, URETEROSCOPY AND STENT  PLACEMENT Left 08/19/2015   Procedure: CYSTOSCOPY WITH RETROGRADE PYELOGRAM, URETEROSCOPY AND STENT PLACEMENT;  Surgeon: Jerilee Field, MD;  Location: [facility];  Service: Urology;  Laterality: Left;  . CYSTOSCOPY WITH URETEROSCOPY AND STENT PLACEMENT Left 01/16/2016   Procedure: CYSTOSCOPY WITH LEFT RETROGRADE PYELOGRAM  LEFT DIGITAL URETEROSCOPY AND PLACEMENT LEFT URETERAL STENT;  Surgeon: Jerilee Field, MD;  Location: [facility];  Service: Urology;  Laterality: Left;  . CYSTOSCOPY WITH URETEROSCOPY, STONE BASKETRY AND STENT PLACEMENT Left 02/13/2016   Procedure: CYSTOSCOPY WITH LEFT URETEROSCOPY, HOLMIUM LASER AND STENT PLACEMENT;  Surgeon: Jerilee Field, MD;  Location: [facility];  Service: Urology;  Laterality: Left;  . CYSTOSCOPY/RETROGRADE/URETEROSCOPY/STONE EXTRACTION WITH BASKET Left 08/08/2015   Procedure: CYSTOSCOPY/RETROGRADE/URETEROSCOPY/STONE EXTRACTION WITH BASKET;  Surgeon: Jerilee Field, MD;  Location: [facility];  Service: Urology;  Laterality: Left;  . CYSTOSCOPY/URETEROSCOPY/HOLMIUM LASER/STENT PLACEMENT Left 07/30/2016   Procedure: CYSTOSCOPY/URETEROSCOPY/HOLMIUM LASER/STENT PLACEMENT;  Surgeon: Jerilee Field, MD;  Location: [facility];  Service: Urology;  Laterality: Left;  . CYSTOSCOPY/URETEROSCOPY/HOLMIUM LASER/STENT PLACEMENT Left 08/19/2017   Procedure: CYSTOSCOPY/URETEROSCOPY/HOLMIUM LASER/STENT PLACEMENT;  Surgeon: Jerilee Field, MD;  Location: [facility];  Service: Urology;  Laterality: Left;  . EXTRACORPOREAL SHOCK WAVE LITHOTRIPSY Left 07-07-2015  &  12-26-2014  . 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 HOLMIUM LASER APPLICATION Left 07/05/2014   Procedure: LASER LITHO;  Surgeon: Jerilee Field, MD;  Location: [facility];  Service: Urology;  Laterality: Left;  . HOLMIUM LASER APPLICATION Left 08/08/2015   Procedure: HOLMIUM LASER  WITH  LITHOTRIPSY ;  Surgeon: Jerilee Field, MD;  Location: [facility];  Service: Urology;  Laterality: Left;  . HOLMIUM LASER APPLICATION Left 02/21/2017   Procedure: HOLMIUM LASER APPLICATION;  Surgeon: Bjorn Pippin, MD;  Location: [facility];  Service: Urology;  Laterality: Left;  . LYMPH GLAND EXCISION  2003   neck--  benign    FAMILY HISTORY: Family History  Problem Relation Age of Onset  . Cancer Father   . Kidney Stones Mother   . Cancer Other   . Hypertension Other   . Hyperlipidemia Other   . Stroke Other   . Kidney Stones Brother     SOCIAL HISTORY:  Social History   Socioeconomic History  . Marital status: Single    Spouse name: Not on file  . Number of children: Not on file  . Years of education: Not  on file  . Highest education level: Not on file  Social Needs  . Financial resource strain: Not on file  . Food insecurity - worry: Not on file  . Food insecurity - inability: Not on file  . Transportation needs - medical: Not on file  . Transportation needs - non-medical: Not on file  Occupational History  . Not on file  Tobacco Use  . Smoking status: Former Smoker    Years: 2.00    Last attempt to quit: 07/29/2014    Years since quitting: 3.5  . Smokeless tobacco: Never Used  . Tobacco comment: social smoker  Substance and Sexual Activity  . Alcohol use: Yes    Alcohol/week: 0.0 oz    Comment: occ  . Drug use: No  . Sexual activity: No  Other Topics Concern  . Not on file  Social History Narrative  . Not on file     PHYSICAL EXAM  Vitals:   03/02/18 1013  BP: 129/87  Pulse: 90  Resp: 16  Weight: 245 lb (111.1 kg)    Body mass index is 37.25 kg/m.   General: The patient is well-developed and well-nourished and in no acute distress  Neck:    The neck is slightly tender over the right splenius capitis muscle .  Neurologic Exam  Mental status: The patient is alert and oriented x 3 at the time of the examination. The patient has  apparent normal recent and remote memory, with an apparently normal attention span and concentration ability.   Speech is normal.  Cranial nerves: Extraocular movements are full.  Facial strength is normal..     Motor:  Muscle bulk is normal.   Tone is normal. Strength is  5 / 5 in all 4 extremities.    Gait and station: Station is normal.   Gait is normal.         DIAGNOSTIC DATA (LABS, IMAGING, TESTING) - I reviewed patient records, labs, notes, testing and imaging myself where available.  Lab Results  Component Value Date   WBC 16.8 (H) 08/12/2017   HGB 13.4 08/12/2017   HCT 37.2 (L) 08/12/2017   MCV 85.1 08/12/2017   PLT 160 08/12/2017      Component Value Date/Time   NA 139 08/12/2017 0306   K 4.0 08/12/2017 0306   CL 108 08/12/2017 0306   CO2 26 08/12/2017 0306   GLUCOSE 139 (H) 08/12/2017 0306   BUN 18 08/12/2017 0306   CREATININE 1.23 08/12/2017 0306   CALCIUM 8.5 (L) 08/12/2017 0306   PROT 7.8 12/26/2016 1601   ALBUMIN 4.3 12/26/2016 1601   AST 37 12/26/2016 1601   ALT 40 12/26/2016 1601   ALKPHOS 57 12/26/2016 1601   BILITOT 0.5 12/26/2016 1601   GFRNONAA >60 08/12/2017 0306   GFRAA >60 08/12/2017 0306       ASSESSMENT AND PLAN  Chronic migraine    1.  Botox 200 u:  Frontalis (5 units 4), temporalis (5 units 8), occipitalis (5 units 6), corrugators and procerus (5 units 3), splenius capitis (15 units left and 20 U right), trapezius (10 units 2), C3C4 paraspinal muscles (10 U x 2); C6-C7 paraspinal muscles (10 units 2)     Office supply   [phone] 2.   Continue prn Maxalt.   Renew med's.  3.    Return for Botox in 3 months or call sooner if there are significant changes in the headache frequency or other new neurologic symptoms.  Richard A. Epimenio FootSater, MD, PhD 03/02/2018, 12:55 PM Certified in Neurology, Clinical Neurophysiology, Sleep Medicine, Pain Medicine and Neuroimaging  [facility] [address], [address] [place], [place] [id number] [phone]

## 2018-03-06 NOTE — Telephone Encounter (Signed)
I called and scheduled the patient.  °

## 2018-03-12 ENCOUNTER — Encounter (HOSPITAL_COMMUNITY): Payer: Self-pay | Admitting: *Deleted

## 2018-03-12 ENCOUNTER — Emergency Department (HOSPITAL_COMMUNITY): Payer: BLUE CROSS/BLUE SHIELD

## 2018-03-12 ENCOUNTER — Other Ambulatory Visit: Payer: Self-pay

## 2018-03-12 ENCOUNTER — Emergency Department (HOSPITAL_COMMUNITY)
Admission: EM | Admit: 2018-03-12 | Discharge: 2018-03-12 | Disposition: A | Discharge status: Home / Self Care | Payer: BLUE CROSS/BLUE SHIELD | Attending: Emergency Medicine | Admitting: Emergency Medicine

## 2018-03-12 DIAGNOSIS — Z79899 Other long term (current) drug therapy: Secondary | ICD-10-CM | POA: Insufficient documentation

## 2018-03-12 DIAGNOSIS — N201 Calculus of ureter: Secondary | ICD-10-CM | POA: Insufficient documentation

## 2018-03-12 DIAGNOSIS — Z87891 Personal history of nicotine dependence: Secondary | ICD-10-CM | POA: Diagnosis not present

## 2018-03-12 DIAGNOSIS — N132 Hydronephrosis with renal and ureteral calculous obstruction: Secondary | ICD-10-CM | POA: Diagnosis not present

## 2018-03-12 DIAGNOSIS — N1330 Unspecified hydronephrosis: Secondary | ICD-10-CM | POA: Insufficient documentation

## 2018-03-12 DIAGNOSIS — R1111 Vomiting without nausea: Secondary | ICD-10-CM | POA: Diagnosis not present

## 2018-03-12 DIAGNOSIS — R109 Unspecified abdominal pain: Secondary | ICD-10-CM | POA: Diagnosis not present

## 2018-03-12 LAB — CBC WITH DIFFERENTIAL/PLATELET
Basophils Absolute: 0 10*3/uL (ref 0.0–0.1)
Basophils Relative: 0 %
Eosinophils Absolute: 0 10*3/uL (ref 0.0–0.7)
Eosinophils Relative: 0 %
HCT: 48.6 % (ref 39.0–52.0)
Hemoglobin: 16.7 g/dL (ref 13.0–17.0)
Lymphocytes Relative: 22 %
Lymphs Abs: 1.8 10*3/uL (ref 0.7–4.0)
MCH: 30.3 pg (ref 26.0–34.0)
MCHC: 34.4 g/dL (ref 30.0–36.0)
MCV: 88.2 fL (ref 78.0–100.0)
Monocytes Absolute: 0.5 10*3/uL (ref 0.1–1.0)
Monocytes Relative: 6 %
Neutro Abs: 5.6 10*3/uL (ref 1.7–7.7)
Neutrophils Relative %: 72 %
Platelets: 222 10*3/uL (ref 150–400)
RBC: 5.51 MIL/uL (ref 4.22–5.81)
RDW: 12.7 % (ref 11.5–15.5)
WBC: 7.9 10*3/uL (ref 4.0–10.5)

## 2018-03-12 LAB — URINALYSIS, ROUTINE W REFLEX MICROSCOPIC
Bacteria, UA: NONE SEEN
Bilirubin Urine: NEGATIVE
Glucose, UA: NEGATIVE mg/dL
Ketones, ur: NEGATIVE mg/dL
Leukocytes, UA: NEGATIVE
Nitrite: NEGATIVE
Protein, ur: 30 mg/dL — AB
Specific Gravity, Urine: 1.021 (ref 1.005–1.030)
Squamous Epithelial / LPF: NONE SEEN
pH: 5 (ref 5.0–8.0)

## 2018-03-12 LAB — BASIC METABOLIC PANEL
Anion gap: 9 (ref 5–15)
BUN: 19 mg/dL (ref 6–20)
CO2: 29 mmol/L (ref 22–32)
Calcium: 9.7 mg/dL (ref 8.9–10.3)
Chloride: 103 mmol/L (ref 101–111)
Creatinine, Ser: 1.03 mg/dL (ref 0.61–1.24)
GFR calc Af Amer: >60 mL/min (ref >60)
GFR calc non Af Amer: >60 mL/min (ref >60)
Glucose, Bld: 104 mg/dL — ABNORMAL HIGH (ref 65–99)
Potassium: 4.8 mmol/L (ref 3.5–5.1)
Sodium: 141 mmol/L (ref 135–145)

## 2018-03-12 MED ORDER — KETOROLAC TROMETHAMINE 30 MG/ML IJ SOLN
30.0000 mg | Freq: Once | INTRAMUSCULAR | Status: AC
  Administered 2018-03-12: 30 mg via INTRAVENOUS
  Filled 2018-03-12: qty 1

## 2018-03-12 MED ORDER — HYDROMORPHONE HCL 1 MG/ML IJ SOLN
1.0000 mg | Freq: Once | INTRAMUSCULAR | Status: AC
  Administered 2018-03-12: 1 mg via INTRAVENOUS
  Filled 2018-03-12: qty 1

## 2018-03-12 MED ORDER — ONDANSETRON HCL 4 MG/2ML IJ SOLN
4.0000 mg | Freq: Once | INTRAMUSCULAR | Status: AC
  Administered 2018-03-12: 4 mg via INTRAVENOUS
  Filled 2018-03-12: qty 2

## 2018-03-12 MED ORDER — TAMSULOSIN HCL 0.4 MG PO CAPS: ORAL_CAPSULE | ORAL | 0 refills | Status: DC

## 2018-03-12 MED ORDER — OXYCODONE-ACETAMINOPHEN 5-325 MG PO TABS: 1.0000 | ORAL_TABLET | ORAL | 0 refills | Status: DC | PRN

## 2018-03-12 MED ORDER — SODIUM CHLORIDE 0.9 % IV SOLN
INTRAVENOUS | Status: DC
  Administered 2018-03-12: 10:00:00 via INTRAVENOUS

## 2018-03-12 NOTE — ED Provider Notes (Addendum)
[facility]-EMERGENCY DEPT Provider Note   CSN: [id number] Arrival date & time: 03/12/18  5284     History   Chief Complaint Chief Complaint  Patient presents with  . Flank Pain    Left    HPI KROY SPRUNG is a 27 y.o. male.  He reports onset of left flank pain last night, persisted today, unrelieved by over-the-counter analgesia which she is taking at home.  He has nausea and has vomited a couple times.  He denies diarrhea, fever, chills, dysuria, urinary frequency or hematuria.  He states that this pain feels like a kidney stone.  He last had similar problems, August 2018 at which time he had ureteral stent placed, kidney stone mobilization and laser lithotripsy, for intrarenal stones.  There are no other known modifying factors  HPI  Past Medical History:  Diagnosis Date  . Complication of anesthesia   . Frequency-urgency syndrome   . History of kidney stones   . Hx of nausea and vomiting    d/t kidney stone  . Hx of sepsis 08/11/2017   due to kidney stone/ hydronephrosis  . Left ureteral stone   . Migraine   . Pain due to ureteral stent (HCC)   . PONV (postoperative nausea and vomiting)    none recently  . Renal calculi    bilateral per ct 07-26-2016  . Scoliosis of lumbar spine 1994   treated at [facility] until age 41  . Wears glasses     Patient Active Problem List   Diagnosis Date Noted  . Chronic migraine 02/14/2017  . Neck pain 02/04/2016  . Hydronephrosis, left 08/19/2015  . Hydronephrosis determined by ultrasound   . Kidney stone on left side 08/18/2015  . Nephrolithiasis 08/18/2015  . Kidney stone 07/06/2015  . Left ureteral stone 07/06/2015  . Analgesic rebound headache 06/05/2014  . Migraine headache without aura 05/30/2014    Past Surgical History:  Procedure Laterality Date  . CYSTOSCOPY W/ RETROGRADES Right 07/06/2015   Procedure: CYSTOSCOPY WITH RETROGRADE PYELOGRAM;  Surgeon: Jerilee Field, MD;  Location: [facility];   Service: Urology;  Laterality: Right;  . CYSTOSCOPY W/ URETERAL STENT PLACEMENT Left 08/08/2015   Procedure: CYSTOSCOPY WITH STENT REPLACEMENT;  Surgeon: Jerilee Field, MD;  Location: [facility];  Service: Urology;  Laterality: Left;  . CYSTOSCOPY W/ URETERAL STENT PLACEMENT Left 02/21/2017   Procedure: CYSTOSCOPY WITH RETROGRADE PYELOGRAM/URETERAL LEFT STENT PLACEMENT WITH  LASER;  Surgeon: Bjorn Pippin, MD;  Location: [facility];  Service: Urology;  Laterality: Left;  . CYSTOSCOPY W/ URETERAL STENT PLACEMENT Left 08/10/2017   Procedure: CYSTOSCOPY WITH RETROGRADE PYELOGRAM/URETERAL STENT PLACEMENT;  Surgeon: Heloise Purpura, MD;  Location: [facility];  Service: Urology;  Laterality: Left;  . CYSTOSCOPY WITH RETROGRADE PYELOGRAM, URETEROSCOPY AND STENT PLACEMENT Left 06/24/2014   Procedure: CYSTOSCOPY WITH RETROGRADE PYELOGRAM,  AND STENT PLACEMENT;  Surgeon: Magdalene Molly, MD;  Location: [facility];  Service: Urology;  Laterality: Left;  . CYSTOSCOPY WITH RETROGRADE PYELOGRAM, URETEROSCOPY AND STENT PLACEMENT Left 07/05/2014   Procedure: CYSTO/LEFT URETEROSCOPY/LEFT RETROGRADE PYELOGRAM/LEFT STENT PLACEMENT;  Surgeon: Jerilee Field, MD;  Location: [facility];  Service: Urology;  Laterality: Left;  . CYSTOSCOPY WITH RETROGRADE PYELOGRAM, URETEROSCOPY AND STENT PLACEMENT Left 07/06/2015   Procedure: CYSTOSCOPY WITH RETROGRADE PYELOGRAM, URETEROSCOPY , LASER, STENT PLACEMENT and BASKET EXTRACTION;  Surgeon: Jerilee Field, MD;  Location: [facility];  Service: Urology;  Laterality: Left;  . CYSTOSCOPY WITH RETROGRADE PYELOGRAM, URETEROSCOPY AND STENT PLACEMENT Left 08/19/2015  Procedure: CYSTOSCOPY WITH RETROGRADE PYELOGRAM, URETEROSCOPY AND STENT PLACEMENT;  Surgeon: Jerilee Field, MD;  Location: [facility];  Service: Urology;  Laterality: Left;  . CYSTOSCOPY WITH RETROGRADE PYELOGRAM, URETEROSCOPY AND STENT PLACEMENT Left 03/13/2018   Procedure: CYSTOSCOPY WITH RETROGRADE PYELOGRAM,  URETEROSCOPY AND LEFT STENT PLACEMENT;  Surgeon: Bjorn Pippin, MD;  Location: [facility];  Service: Urology;  Laterality: Left;  . CYSTOSCOPY WITH URETEROSCOPY AND STENT PLACEMENT Left 01/16/2016   Procedure: CYSTOSCOPY WITH LEFT RETROGRADE PYELOGRAM  LEFT DIGITAL URETEROSCOPY AND PLACEMENT LEFT URETERAL STENT;  Surgeon: Jerilee Field, MD;  Location: [facility];  Service: Urology;  Laterality: Left;  . CYSTOSCOPY WITH URETEROSCOPY, STONE BASKETRY AND STENT PLACEMENT Left 02/13/2016   Procedure: CYSTOSCOPY WITH LEFT URETEROSCOPY, HOLMIUM LASER AND STENT PLACEMENT;  Surgeon: Jerilee Field, MD;  Location: [facility];  Service: Urology;  Laterality: Left;  . CYSTOSCOPY/RETROGRADE/URETEROSCOPY/STONE EXTRACTION WITH BASKET Left 08/08/2015   Procedure: CYSTOSCOPY/RETROGRADE/URETEROSCOPY/STONE EXTRACTION WITH BASKET;  Surgeon: Jerilee Field, MD;  Location[facility];  Service: Urology;  Laterality: Left;  . CYSTOSCOPY/URETEROSCOPY/HOLMIUM LASER/STENT PLACEMENT Left 07/30/2016   Procedure: CYSTOSCOPY/URETEROSCOPY/HOLMIUM LASER/STENT PLACEMENT;  Surgeon: Jerilee Field, MD;  Location: [facility];  Service: Urology;  Laterality: Left;  . CYSTOSCOPY/URETEROSCOPY/HOLMIUM LASER/STENT PLACEMENT Left 08/19/2017   Procedure: CYSTOSCOPY/URETEROSCOPY/HOLMIUM LASER/STENT PLACEMENT;  Surgeon: Jerilee Field, MD;  Location: [facility];  Service: Urology;  Laterality: Left;  . EXTRACORPOREAL SHOCK WAVE LITHOTRIPSY Left 07-07-2015  &  12-26-2014  . 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 HOLMIUM LASER APPLICATION Left 07/05/2014   Procedure: LASER LITHO;  Surgeon: Jerilee Field, MD;  Location[facility];  Service: Urology;  Laterality: Left;  . HOLMIUM LASER APPLICATION Left 08/08/2015   Procedure: HOLMIUM LASER  WITH LITHOTRIPSY ;  Surgeon: Jerilee Field, MD;  Location: [facility];  Service: Urology;  Laterality: Left;  . HOLMIUM LASER APPLICATION Left 02/21/2017   Procedure: HOLMIUM LASER APPLICATION;  Surgeon: Bjorn Pippin, MD;  Location: [facility];  Service: Urology;  Laterality: Left;  . HOLMIUM LASER APPLICATION Left 03/13/2018   Procedure: HOLMIUM LASER APPLICATION;  Surgeon: Bjorn Pippin, MD;  Location: [facility];  Service: Urology;  Laterality: Left;  . LYMPH GLAND EXCISION  2003   neck--  benign        Home Medications    Prior to Admission medications   Medication Sig Start Date End Date Taking? Authorizing Provider  hydrochlorothiazide (HYDRODIURIL) 25 MG tablet Take 25 mg by mouth 2 (two) times daily.   Yes [provider]  lamoTRIgine (LAMICTAL) 100 MG tablet One po bid 03/02/18  Yes Sater, Pearletha Furl, MD  levETIRAcetam (KEPPRA) 750 MG tablet TAKE 1 TABLET BY MOUTH EVERY MORNING AND 2 TABLETS EVERY NIGHT AT BEDTIME 03/02/18  Yes Sater, Pearletha Furl, MD  potassium chloride SA (KLOR-CON M15) 15 MEQ tablet Take 30 mEq by mouth 2 (two) times daily. Pt takes 2 tabs, twice a day    Yes [provider]  rizatriptan (MAXALT-MLT) 10 MG disintegrating tablet DISSOLVE 1 TABLET ON THE TONGUE EVERY DAY AS NEEDED FOR MIGRAINE. MAY REPEAT IN 2 HOURS IF NEEDED. MAX OF 2 TABLETS PER DAY 03/02/18  Yes Sater, Pearletha Furl, MD  BOTOX 100 units SOLR injection INJECT IN THE MUSCLE TO HEAD AND NECK MUSCLES EVERY 3 MONTHS BY PROVIDER 06/06/17   Sater, Pearletha Furl, MD  docusate sodium (COLACE) 100 MG capsule Take 2 capsules (200 mg total)  by mouth 2 (two) times daily. 03/13/18 03/13/19  Budd Palmer, MD  ibuprofen (ADVIL,MOTRIN) 200 MG tablet Take 200 mg by mouth every 6 (six) hours as needed for headache or mild pain.    [provider]  ondansetron (ZOFRAN) 4 MG tablet Take 1 tablet (4 mg total) by mouth every 8 (eight) hours as needed for nausea or vomiting. 07/25/17   Maczis, Elmer Sow, PA-C  oxybutynin (DITROPAN) 5 MG tablet Take 1 tablet (5 mg total) by mouth  every 8 (eight) hours as needed for bladder spasms. 03/13/18   Budd Palmer, MD  oxycodone (OXY-IR) 5 MG capsule Take 1-2 capsules (5-10 mg total) by mouth every 4 (four) hours as needed. 03/13/18   Budd Palmer, MD  promethazine (PHENERGAN) 25 MG tablet Take 1 tablet (25 mg total) by mouth every 6 (six) hours as needed for nausea or vomiting. 08/09/17   Triplett, Tammy, PA-C  senna (SENOKOT) 8.6 MG TABS tablet Take 1-2 tablets (8.6-17.2 mg total) by mouth at bedtime. 03/13/18 04/12/18  Budd Palmer, MD  tamsulosin [facility]) 0.4 MG CAPS capsule 1 q HS to aid stone passage 03/12/18   Mancel Bale, MD    Family History Family History  Problem Relation Age of Onset  . Cancer Father   . Kidney Stones Mother   . Cancer Other   . Hypertension Other   . Hyperlipidemia Other   . Stroke Other   . Kidney Stones Brother     Social History Social History   Tobacco Use  . Smoking status: Former Smoker    Years: 2.00    Last attempt to quit: 07/29/2014    Years since quitting: 3.6  . Smokeless tobacco: Never Used  . Tobacco comment: social smoker  Substance Use Topics  . Alcohol use: Yes    Alcohol/week: 0.0 oz    Comment: occ  . Drug use: No     Allergies   Bactrim [sulfamethoxazole-trimethoprim]; Codeine; and Adhesive [tape]   Review of Systems Review of Systems  All other systems reviewed and are negative.    Physical Exam Updated Vital Signs BP 121/80 (BP Location: Right Arm)   Pulse 77   Temp (!) 97.5 F (36.4 C) (Oral)   Resp 18   Ht 5\' 9"  (1.753 m)   Wt 108.9 kg (240 lb)   SpO2 95%   BMI 35.44 kg/m   Physical Exam  Constitutional: He is oriented to person, place, and time. He appears well-developed and well-nourished. He appears distressed (He is uncomfortable).  HENT:  Head: Normocephalic and atraumatic.  Right Ear: External ear normal.  Left Ear: External ear normal.  Eyes: Pupils are equal, round, and reactive to light. Conjunctivae  and EOM are normal.  Neck: Normal range of motion and phonation normal. Neck supple.  Cardiovascular: Normal rate, regular rhythm and normal heart sounds.  Pulmonary/Chest: Effort normal and breath sounds normal. He exhibits no bony tenderness.  Abdominal: Soft. There is no tenderness.  Genitourinary:  Genitourinary Comments: No flank tenderness to palpation or percussion.  Musculoskeletal: Normal range of motion.  Neurological: He is alert and oriented to person, place, and time. No cranial nerve deficit or sensory deficit. He exhibits normal muscle tone. Coordination normal.  Skin: Skin is warm, dry and intact.  Psychiatric: He has a normal mood and affect. His behavior is normal. Judgment and thought content normal.  Nursing note and vitals reviewed.    ED Treatments / Results  Labs (all labs ordered  are listed, but only abnormal results are displayed) Labs Reviewed  URINALYSIS, ROUTINE W REFLEX MICROSCOPIC - Abnormal; Notable for the following components:      Result Value   Hgb urine dipstick LARGE (*)    Protein, ur 30 (*)    All other components within normal limits  BASIC METABOLIC PANEL - Abnormal; Notable for the following components:   Glucose, Bld 104 (*)    All other components within normal limits  CBC WITH DIFFERENTIAL/PLATELET    EKG None  Radiology Dg C-arm 1-60 Min-no Report  Result Date: 03/13/2018 Fluoroscopy was utilized by the requesting physician.  No radiographic interpretation.    Procedures Procedures (including critical care time)  Medications Ordered in ED Medications  HYDROmorphone (DILAUDID) injection 1 mg (1 mg Intravenous Given 03/12/18 0942)  ondansetron (ZOFRAN) injection 4 mg (4 mg Intravenous Given 03/12/18 0943)  HYDROmorphone (DILAUDID) injection 1 mg (1 mg Intravenous Given 03/12/18 1157)  ketorolac (TORADOL) 30 MG/ML injection 30 mg (30 mg Intravenous Given 03/12/18 1351)     Initial Impression / Assessment and Plan / ED Course    I have reviewed the triage vital signs and the nursing notes.  Pertinent labs & imaging results that were available during my care of the patient were reviewed by me and considered in my medical decision making (see chart for details).  Clinical Course as of Mar 14 1042  Sun Mar 12, 2018  1133 Case discussed with urologist, Dr. Liliane ShiWinter, who recommends screening with blood work, and if it is normal he can "go home."   [EW]  1453 At this point he states that he is comfortable.   [EW]  1453 Normal  CBC with Differential [EW]  1454 Normal  Basic metabolic panel(!) [EW]  1454 Increased red cells, no white cells  Urinalysis, Routine w reflex microscopic- may I&O cath if menses(!) [EW]    Clinical Course User Index [EW] Mancel BaleWentz, Rawson Minix, MD     No data found.  At discharge- reevaluation with update and discussion. After initial assessment and treatment, an updated evaluation reveals his pain is controlled, findings and plan discussed and agreed to with patient.Mancel Bale. Allea Kassner   MDM-patient with recurrent nephrolithiasis, complicated today by passage of a very large stone into the left proximal ureter with subsequent hydronephrosis.  No evidence for acute infectious process, or hemodynamic instability.  Patient's pain has been controlled significantly by treatment in the emergency department.  No indication for hospitalization or further intervention at this time.  Nursing Notes Reviewed/ Care Coordinated Applicable Imaging Reviewed Interpretation of Laboratory Data incorporated into ED treatment  The patient appears reasonably screened and/or stabilized for discharge and I doubt any other medical condition or other [facility] requiring further screening, evaluation, or treatment in the ED at this time prior to discharge.  Plan: Home Medications-continue current medications; Home Treatments-gradually advance diet; return here if the recommended treatment, does not improve the symptoms; Recommended  follow up-urology follow-up as soon as possible    Final Clinical Impressions(s) / ED Diagnoses   Final diagnoses:  Left ureteral stone  Hydronephrosis of left kidney    ED Discharge Orders        Ordered    oxyCODONE-acetaminophen (PERCOCET) 5-325 MG tablet  Every 4 hours PRN,   Status:  Discontinued     03/12/18 1455    tamsulosin (FLOMAX) 0.4 MG CAPS capsule     03/12/18 1455       Mancel BaleWentz, Avionna Bower, MD 03/14/18 1045    Effie ShyWentz,  Mechele Collin, MD 03/14/18 1047

## 2018-03-12 NOTE — ED Notes (Signed)
Bed: WA09 Expected date:  Expected time:  Means of arrival:  Comments: Tri 2 

## 2018-03-12 NOTE — ED Triage Notes (Signed)
History of kidney stones, left flank pain started yesterday, has had multiple surgeries due to size of stones, vomiting in triage

## 2018-03-12 NOTE — Discharge Instructions (Addendum)
Call your doctor tomorrow morning for a follow-up appointment to be seen as soon as possible about the left ureter stone.

## 2018-03-13 ENCOUNTER — Encounter (HOSPITAL_COMMUNITY)
Admission: RE | Disposition: A | Discharge status: Home / Self Care | Payer: Self-pay | Source: Ambulatory Visit | Attending: Urology

## 2018-03-13 ENCOUNTER — Other Ambulatory Visit: Payer: Self-pay | Admitting: Urology

## 2018-03-13 ENCOUNTER — Ambulatory Visit (HOSPITAL_COMMUNITY)
Admission: RE | Admit: 2018-03-13 | Discharge: 2018-03-13 | Disposition: A | Discharge status: Home / Self Care | Payer: BLUE CROSS/BLUE SHIELD | Source: Ambulatory Visit | Attending: Urology | Admitting: Urology

## 2018-03-13 ENCOUNTER — Ambulatory Visit (HOSPITAL_COMMUNITY): Payer: BLUE CROSS/BLUE SHIELD | Admitting: Certified Registered Nurse Anesthetist

## 2018-03-13 ENCOUNTER — Encounter (HOSPITAL_COMMUNITY): Payer: Self-pay | Admitting: *Deleted

## 2018-03-13 ENCOUNTER — Other Ambulatory Visit: Payer: Self-pay

## 2018-03-13 ENCOUNTER — Ambulatory Visit (HOSPITAL_COMMUNITY): Payer: BLUE CROSS/BLUE SHIELD

## 2018-03-13 DIAGNOSIS — G43909 Migraine, unspecified, not intractable, without status migrainosus: Secondary | ICD-10-CM | POA: Diagnosis not present

## 2018-03-13 DIAGNOSIS — N200 Calculus of kidney: Secondary | ICD-10-CM | POA: Diagnosis not present

## 2018-03-13 DIAGNOSIS — N201 Calculus of ureter: Secondary | ICD-10-CM | POA: Diagnosis not present

## 2018-03-13 DIAGNOSIS — N202 Calculus of kidney with calculus of ureter: Secondary | ICD-10-CM | POA: Diagnosis not present

## 2018-03-13 DIAGNOSIS — Z79899 Other long term (current) drug therapy: Secondary | ICD-10-CM | POA: Diagnosis not present

## 2018-03-13 DIAGNOSIS — F17200 Nicotine dependence, unspecified, uncomplicated: Secondary | ICD-10-CM | POA: Diagnosis not present

## 2018-03-13 HISTORY — PX: CYSTOSCOPY WITH RETROGRADE PYELOGRAM, URETEROSCOPY AND STENT PLACEMENT: SHX5789

## 2018-03-13 HISTORY — PX: HOLMIUM LASER APPLICATION: SHX5852

## 2018-03-13 SURGERY — CYSTOURETEROSCOPY, WITH RETROGRADE PYELOGRAM AND STENT INSERTION
Anesthesia: General | Laterality: Left

## 2018-03-13 MED ORDER — MIDAZOLAM HCL 2 MG/2ML IJ SOLN
INTRAMUSCULAR | Status: AC
  Filled 2018-03-13: qty 2

## 2018-03-13 MED ORDER — FENTANYL CITRATE (PF) 100 MCG/2ML IJ SOLN
50.0000 ug | INTRAMUSCULAR | Status: AC | PRN
  Administered 2018-03-13 (×2): 50 ug via INTRAVENOUS
  Filled 2018-03-13: qty 2

## 2018-03-13 MED ORDER — DEXAMETHASONE SODIUM PHOSPHATE 10 MG/ML IJ SOLN
INTRAMUSCULAR | Status: AC
  Filled 2018-03-13: qty 1

## 2018-03-13 MED ORDER — PROMETHAZINE HCL 25 MG/ML IJ SOLN: 6.2500 mg | INTRAMUSCULAR | Status: DC | PRN

## 2018-03-13 MED ORDER — KETOROLAC TROMETHAMINE 30 MG/ML IJ SOLN
INTRAMUSCULAR | Status: AC
  Filled 2018-03-13: qty 1

## 2018-03-13 MED ORDER — FENTANYL CITRATE (PF) 100 MCG/2ML IJ SOLN
25.0000 ug | INTRAMUSCULAR | Status: DC | PRN
  Administered 2018-03-13: 50 ug via INTRAVENOUS

## 2018-03-13 MED ORDER — DEXAMETHASONE SODIUM PHOSPHATE 10 MG/ML IJ SOLN
INTRAMUSCULAR | Status: DC | PRN
  Administered 2018-03-13: 10 mg via INTRAVENOUS

## 2018-03-13 MED ORDER — PROPOFOL 10 MG/ML IV BOLUS
INTRAVENOUS | Status: DC | PRN
  Administered 2018-03-13: 200 mg via INTRAVENOUS
  Administered 2018-03-13: 50 mg via INTRAVENOUS

## 2018-03-13 MED ORDER — PROPOFOL 10 MG/ML IV BOLUS
INTRAVENOUS | Status: AC
  Filled 2018-03-13: qty 20

## 2018-03-13 MED ORDER — FENTANYL CITRATE (PF) 100 MCG/2ML IJ SOLN
INTRAMUSCULAR | Status: AC
  Filled 2018-03-13: qty 2

## 2018-03-13 MED ORDER — SODIUM CHLORIDE 0.9 % IR SOLN
Status: DC | PRN
  Administered 2018-03-13: 5000 mL

## 2018-03-13 MED ORDER — OXYBUTYNIN CHLORIDE 5 MG PO TABS: 5.0000 mg | ORAL_TABLET | Freq: Three times a day (TID) | ORAL | 3 refills | Status: DC | PRN

## 2018-03-13 MED ORDER — DOCUSATE SODIUM 100 MG PO CAPS: 200.0000 mg | ORAL_CAPSULE | Freq: Two times a day (BID) | ORAL | 11 refills | Status: DC

## 2018-03-13 MED ORDER — CEFAZOLIN SODIUM-DEXTROSE 2-4 GM/100ML-% IV SOLN
2.0000 g | INTRAVENOUS | Status: AC
  Administered 2018-03-13: 2 g via INTRAVENOUS
  Filled 2018-03-13: qty 100

## 2018-03-13 MED ORDER — ONDANSETRON HCL 4 MG/2ML IJ SOLN
INTRAMUSCULAR | Status: AC
  Filled 2018-03-13: qty 2

## 2018-03-13 MED ORDER — SENNA 8.6 MG PO TABS: 1.0000 | ORAL_TABLET | Freq: Every day | ORAL | 0 refills | Status: AC

## 2018-03-13 MED ORDER — LIDOCAINE 2% (20 MG/ML) 5 ML SYRINGE
INTRAMUSCULAR | Status: AC
  Filled 2018-03-13: qty 5

## 2018-03-13 MED ORDER — MIDAZOLAM HCL 5 MG/5ML IJ SOLN
INTRAMUSCULAR | Status: DC | PRN
  Administered 2018-03-13: 2 mg via INTRAVENOUS

## 2018-03-13 MED ORDER — FENTANYL CITRATE (PF) 100 MCG/2ML IJ SOLN
INTRAMUSCULAR | Status: DC | PRN
  Administered 2018-03-13: 25 ug via INTRAVENOUS
  Administered 2018-03-13: 50 ug via INTRAVENOUS
  Administered 2018-03-13 (×5): 25 ug via INTRAVENOUS

## 2018-03-13 MED ORDER — KETOROLAC TROMETHAMINE 30 MG/ML IJ SOLN
30.0000 mg | Freq: Once | INTRAMUSCULAR | Status: DC | PRN
  Administered 2018-03-13: 30 mg via INTRAVENOUS

## 2018-03-13 MED ORDER — OXYCODONE HCL 5 MG PO CAPS: 5.0000 mg | ORAL_CAPSULE | ORAL | 0 refills | Status: DC | PRN

## 2018-03-13 MED ORDER — SODIUM CHLORIDE 0.9 % IR SOLN
Status: DC | PRN
  Administered 2018-03-13: 1000 mL

## 2018-03-13 MED ORDER — LACTATED RINGERS IV SOLN
INTRAVENOUS | Status: DC
  Administered 2018-03-13 (×2): via INTRAVENOUS

## 2018-03-13 MED ORDER — LIDOCAINE 2% (20 MG/ML) 5 ML SYRINGE
INTRAMUSCULAR | Status: DC | PRN
  Administered 2018-03-13: 50 mg via INTRAVENOUS

## 2018-03-13 MED ORDER — IOHEXOL 300 MG/ML  SOLN
INTRAMUSCULAR | Status: DC | PRN
  Administered 2018-03-13: 9 mL

## 2018-03-13 SURGICAL SUPPLY — 15 items
BAG URO CATCHER STRL LF (MISCELLANEOUS) ×2 IMPLANT
CATH URET 5FR 28IN OPEN ENDED (CATHETERS) ×2 IMPLANT
CLOTH BEACON ORANGE TIMEOUT ST (SAFETY) ×2 IMPLANT
COVER FOOTSWITCH UNIV (MISCELLANEOUS) ×2 IMPLANT
EXTRACTOR STONE NITINOL NGAGE (UROLOGICAL SUPPLIES) ×2 IMPLANT
FIBER LASER TRAC TIP (UROLOGICAL SUPPLIES) ×2 IMPLANT
GLOVE SURG SS PI 8.0 STRL IVOR (GLOVE) ×4 IMPLANT
GOWN STRL REUS W/TWL XL LVL3 (GOWN DISPOSABLE) ×4 IMPLANT
GUIDEWIRE STR DUAL SENSOR (WIRE) ×4 IMPLANT
MANIFOLD NEPTUNE II (INSTRUMENTS) ×2 IMPLANT
PACK CYSTO (CUSTOM PROCEDURE TRAY) ×2 IMPLANT
SHEATH ACCESS URETERAL 54CM (SHEATH) ×2 IMPLANT
SHEATH URETERAL 12FRX35CM (MISCELLANEOUS) ×2 IMPLANT
STENT URET 6FRX24 CONTOUR (STENTS) ×2 IMPLANT
TUBING CONNECTING 10 (TUBING) ×2 IMPLANT

## 2018-03-13 NOTE — Transfer of Care (Signed)
Immediate Anesthesia Transfer of Care Note  Patient: Thomas Howell  Procedure(s) Performed: Procedure(s): CYSTOSCOPY WITH RETROGRADE PYELOGRAM, URETEROSCOPY AND LEFT STENT PLACEMENT (Left) HOLMIUM LASER APPLICATION (Left)  Patient Location: PACU  Anesthesia Type:General  Level of Consciousness:  sedated, patient cooperative and responds to stimulation  Airway & Oxygen Therapy:Patient Spontanous Breathing and Patient connected to face mask oxgen  Post-op Assessment:  Report given to PACU RN and Post -op Vital signs reviewed and stable  Post vital signs:  Reviewed and stable  Last Vitals:  Vitals:   03/13/18 1903 03/13/18 1904  BP:    Pulse: 88 92  Resp: 18 18  Temp:    SpO2: 100% 100%    Complications: No apparent anesthesia complications

## 2018-03-13 NOTE — Op Note (Addendum)
Preoperative Diagnosis: Left nephrolithiasis  Postoperative Diagnosis:  Same  Procedure(s) Performed:   - Cystourethroscopy - Left ureteroscopic stone extraction with laser lithotripsy - Left retrograde pyelogram - Left ureteral stent placement - Intraoperative fluoroscopy with interpretation <1hr.   Teaching Surgeon:  Dr. Annabell Howells, MD  Resident Surgeon:  Budd Palmer, MD  Assistant(s):  None  Anesthesia:  General  Fluids:  See anesthesia record  Estimated blood loss:  0cc  Specimens:  Stone for analysis  Drains:  Left 6Fr x 24cm JJ ureteral stent without dangler  Complications:  None  Indications: 27 y.o. patient with a history of nephrolithiasis, presenting last night with a 10mm . Risks & benefits of the procedure discussed with the patient, who wishes to proceed.  Findings:  Normal urethra and bladder. No ureteral stone seen, likely retropulsed into kidney during retrograde pyelogram. Several large stones in the kidney fragmented/basketed/dusted. 6Frx24cm JJ ureteral stent placed on the left without string.   Radiologic Interpretation of Retrograde Pyelogram: Left retrograde pyelogram demonstrated no ureteral stone, but did revealed several >1cm stones within the left kidney. No contrast extravasation, filling defects or evidence of hydroureteronephrosis.  Description:  The patient was correctly identified in the preop holding area where written informed consent as well potential risk and complication reviewed. The patient agreed. They were brought back to the operative suite where a preinduction timeout was performed. Once correct information was verified, general anesthesia was induced. They were then gently placed into dorsal lithotomy position with SCDs in place for VTE prophylaxis. They were prepped and draped in the usual sterile fashion and given appropriate preoperative antibiotics. A second timeout was then performed.   We inserted a 77F rigid cystoscope per  urethra with copious lubrication and normal saline irrigation running. This demonstrated findings as described above.    We cannulated the left ureteral orifice with the combination of a sensor wire and 5Fr open ended catheter and the sensor wire was advanced into the expected location of the renal pelvis without difficulty. We were not able to visualize a radioopacity on fluoroscopy in the ureter, but did note several stones in the left kidney. The 5Fr open ended catheter was then advanced over the sensor wire into the ureter and the sensor wire was removed. A retrograde pyelogram was performed with findings as noted above. We then replaced the sensor wire into the right kidney and the 5Fr open ended catheter was removed. We then removed the cystoscope leaving our sensor wire in place.  We then dilated the left ureteral orifice using a 12 French ureteral access sheath.  We then obtained a dual-lumen semirigid ureteroscope, and performed ureteroscopy, which was normal.  Next, we placed a 12/14 [place] by 35 cm ureteral access sheath up to the proximal ureter, and then subsequently performed flexible ureteroscopy of the entire collecting system.  This revealed 2 1 cm stones in the left kidney, as well as other small fragments.  We then obtained a 200 m laser fiber, and performed laser lithotripsy.  We basketed small fragments and sent these for analysis.  All we dusted the stones into innumerable tiny pieces.  After complete treatment of his stone, we removed the ureteroscope and access sheath, leaving the wire in place.  We then placed a 6 [place] by 24 cm JJ ureteral stent over the wire under fluoroscopic and direct visual guidance.  The wire was removed, which revealed appropriate stent position in the bladder and kidney.  We drained the bladder, and removed all instruments.  The patient was woken up from anesthesia and taken to the recovery unit for routine postoperative care.

## 2018-03-13 NOTE — Anesthesia Procedure Notes (Signed)
Procedure Name: LMA Insertion Date/Time: 03/13/2018 7:24 PM Performed by: Paris LoreBlanton, Ahmeer Tuman M, CRNA Pre-anesthesia Checklist: Patient identified, Emergency Drugs available, Suction available, Patient being monitored and Timeout performed Patient Re-evaluated:Patient Re-evaluated prior to induction Oxygen Delivery Method: Circle system utilized Preoxygenation: Pre-oxygenation with 100% oxygen Induction Type: IV induction Ventilation: Mask ventilation without difficulty LMA: LMA inserted LMA Size: 4.0 Number of attempts: 1 Placement Confirmation: positive ETCO2 and breath sounds checked- equal and bilateral Tube secured with: Tape

## 2018-03-13 NOTE — OR Nursing (Signed)
Reviewed D/C instructions with patient and brother. Pharmacy information given for 24 hour pharmacy locations, general anesthesia instructions reviewed with patient and brother.  Patient and brother deny any questions or concerns.

## 2018-03-13 NOTE — Anesthesia Postprocedure Evaluation (Signed)
Anesthesia Post Note  Patient: Sheliah MendsRussell K Cuffie  Procedure(s) Performed: CYSTOSCOPY WITH RETROGRADE PYELOGRAM, URETEROSCOPY AND LEFT STENT PLACEMENT (Left ) HOLMIUM LASER APPLICATION (Left )     Patient location during evaluation: PACU Anesthesia Type: General Level of consciousness: awake and alert Pain management: pain level controlled Vital Signs Assessment: post-procedure vital signs reviewed and stable Respiratory status: spontaneous breathing, nonlabored ventilation, respiratory function stable and patient connected to nasal cannula oxygen Cardiovascular status: blood pressure returned to baseline and stable Postop Assessment: no apparent nausea or vomiting Anesthetic complications: no    Last Vitals:  Vitals:   03/13/18 1904 03/13/18 2030  BP:    Pulse: 92   Resp: 18   Temp:  37.1 C  SpO2: 100%     Last Pain:  Vitals:   03/13/18 2039  TempSrc:   PainSc: 7                  Marjani Kobel S

## 2018-03-13 NOTE — Anesthesia Preprocedure Evaluation (Signed)
Anesthesia Evaluation  Patient identified by MRN, date of birth, ID band Patient awake    Reviewed: Allergy & Precautions, NPO status , Patient's Chart, lab work & pertinent test results  Airway Mallampati: II  TM Distance: >3 FB Neck ROM: Full    Dental no notable dental hx.    Pulmonary neg pulmonary ROS, former smoker,    Pulmonary exam normal breath sounds clear to auscultation       Cardiovascular negative cardio ROS Normal cardiovascular exam Rhythm:Regular Rate:Normal     Neuro/Psych negative neurological ROS  negative psych ROS   GI/Hepatic negative GI ROS, Neg liver ROS,   Endo/Other  negative endocrine ROS  Renal/GU negative Renal ROS  negative genitourinary   Musculoskeletal negative musculoskeletal ROS (+)   Abdominal   Peds negative pediatric ROS (+)  Hematology negative hematology ROS (+)   Anesthesia Other Findings   Reproductive/Obstetrics negative OB ROS                             Anesthesia Physical Anesthesia Plan  ASA: II  Anesthesia Plan: General   Post-op Pain Management:    Induction: Intravenous  PONV Risk Score and Plan: 2 and Ondansetron, Dexamethasone and Treatment may vary due to age or medical condition  Airway Management Planned: LMA  Additional Equipment:   Intra-op Plan:   Post-operative Plan: Extubation in OR  Informed Consent: I have reviewed the patients History and Physical, chart, labs and discussed the procedure including the risks, benefits and alternatives for the proposed anesthesia with the patient or authorized representative who has indicated his/her understanding and acceptance.   Dental advisory given  Plan Discussed with: CRNA and Surgeon  Anesthesia Plan Comments:         Anesthesia Quick Evaluation  

## 2018-03-13 NOTE — H&P (Signed)
CC: I have ureteral stone.  HPI: Thomas Howell is a 27 year-old male established patient who is here for ureteral stone.  The problem is on the left side. He first stated noticing pain on approximately 03/12/2018. This is not his first kidney stone. He is currently having flank pain and nausea. He denies having back pain, groin pain, vomiting, fever, and chills. Pain is occuring on the left side. He has not caught a stone in his urine strainer since his symptoms began.   He has had eswl and ureteroscopy for treatment of his stones in the past.   Ryer returns today in f/u. He was in the ER for left flank pain yesterday and a CT showed a 4 x 26m left proximal stone with obstruction and bilateral renal stones. He has had no hematuria. He has had reduced frequency. He had multiple stones and interventions. He has no other associated signs or symptoms.      ALLERGIES: Adhesive tape Codeine Derivatives Hycodan SYRP    MEDICATIONS: Hydrochlorothiazide 25 mg tablet 1 tablet PO BID  Keppra 1,000 mg tablet Oral  Lamictal 100 mg tablet 1 tablet PO BID  Maxalt Mlt 10 mg tablet,disintegrating 1 tablet PO TID PRN  Potassium Citrate Er 15 meq (1,620 mg) tablet, extended release 2 tablet PO BID     GU PSH: Cysto Remove Stent FB Sim - 03/01/2017 Cysto Uretero Lithotripsy - 2016, 2016, 2015 Cystoscopy Insert Stent, Left - 08/10/2017, 2017, 2016, 2016, 2016, 2015, 2015 Cystoscopy Ureteroscopy - 2017, 2016 ESWL - 2016, 2016 Remove Pelvic Lymph Nodes - 2012 Ureteroscopic laser litho, Left - 08/19/2017, Left - 02/21/2017, Left - 07/30/2016, 2017 Ureteroscopic stone removal, Left - 02/21/2017      PSH Notes: Cystoscopy Ureteroscopy Lithotripsy Incl Insert Indwelling Ureter Stent, Cystoscopy With Insertion Of Ureteral Stent Left, Cystoscopy With Ureteroscopy Left, Cystoscopy With Ureteroscopy Left, Cystoscopy With Insertion Of Ureteral Stent Left, Cystoscopy With Insertion Of Ureteral Stent Left, Cystoscopy  With Ureteroscopy With Lithotripsy, Cystoscopy With Ureteroscopy With Lithotripsy, Cystoscopy With Insertion Of Ureteral Stent Left, Lithotripsy, Lithotripsy, Cystoscopy With Ureteroscopy With Lithotripsy, Cystoscopy With Insertion Of Ureteral Stent Left, Cystoscopy With Insertion Of Ureteral Stent Left, Lymphadenectomy, Foot Surgery   NON-GU PSH: None   GU PMH: Renal calculus (Improving), Left - 09/21/2017, - 08/16/2017, - 06/27/2017, - 03/01/2017, - 09/24/2016, - 07/29/2016, Kidney stone on left side, - 2017, Nephrolithiasis, - 2017 Ureteral calculus - 08/16/2017, 667mLeft proximal stone with pain and nausea. Left renal stones. I will give him toradol and phenergan and get him set up for Ureteroscopy and stent. he is aware of the risks. , - 02/21/2017, - 07/29/2016, Calculus of left ureter, - 2017 Hydronephrosis Unspec, Hydronephrosis, left - 2017 Other microscopic hematuria, Microscopic hematuria - 2017 Gross hematuria, Gross hematuria - 2017    NON-GU PMH: Personal history of other diseases of the nervous system and sense organs, History of migraine headaches - 2017 Encounter for general adult medical examination without abnormal findings, Encounter for preventive health examination - 2017    FAMILY HISTORY: Blood In Urine - Mother, Brother Family Health Status Number - Runs In Family nephrolithiasis - Runs In Family   SOCIAL HISTORY: Marital Status: Single Preferred Language: English; Ethnicity: Not Hispanic Or Latino; Race: White Current Smoking Status: Patient has never smoked.  Has never drank.  Drinks 1 caffeinated drink per day. Patient's occupation isTourist information centre manager    Notes: Current smoker on some days, Alcohol Use, Marital History - Single, Tobacco Use, Caffeine Use,  Occupation:   REVIEW OF SYSTEMS:    GU Review Male:   Patient denies frequent urination, hard to postpone urination, burning/ pain with urination, get up at night to urinate, leakage of urine, stream starts and stops,  trouble starting your stream, have to strain to urinate , erection problems, and penile pain.  Gastrointestinal (Upper):   Patient reports nausea and vomiting. Patient denies indigestion/ heartburn.  Gastrointestinal (Lower):   Patient denies diarrhea and constipation.  Constitutional:   Patient reports night sweats and fatigue. Patient denies fever and weight loss.  Skin:   Patient denies skin rash/ lesion and itching.  Eyes:   Patient denies blurred vision and double vision.  Ears/ Nose/ Throat:   Patient denies sore throat and sinus problems.  Hematologic/Lymphatic:   Patient denies swollen glands and easy bruising.  Cardiovascular:   Patient denies leg swelling and chest pains.  Respiratory:   Patient denies shortness of breath and cough.  Endocrine:   Patient denies excessive thirst.  Musculoskeletal:   Patient reports back pain. Patient denies joint pain.  Neurological:   Patient denies headaches and dizziness.  Psychologic:   Patient denies depression and anxiety.   VITAL SIGNS:      03/13/2018 10:31 AM  Weight 240 lb / 108.86 kg  Height 69 in / 175.26 cm  BP 140/96 mmHg  Pulse 116 /min  Temperature 97.8 F / 36.5 C  BMI 35.4 kg/m   MULTI-SYSTEM PHYSICAL EXAMINATION:    Constitutional: Well-nourished. No physical deformities. Normally developed. Good grooming.  Respiratory: No labored breathing, no use of accessory muscles. Normal breath sounds.  Cardiovascular: Regular rate and rhythm. No murmur, no gallop.   Gastrointestinal: No mass, no tenderness, no rigidity, non obese abdomen.     PAST DATA REVIEWED:  Source Of History:  Patient  Urine Test Review:   Urinalysis  X-Ray Review: C.T. Stone Protocol: Reviewed Films. Reviewed Report. Discussed With Patient. see note    PROCEDURES:         KUB - 82993  A single view of the abdomen is obtained. The left ureteral stone is visible below the left L5 transverse process. There is a 85m LLP stone. He has marked scoliosis with  right concavity. NO other abnormalities are noted.                Urinalysis w/Scope Dipstick Dipstick Cont'd Micro  Color: Yellow Bilirubin: Neg WBC/hpf: 0 - 5/hpf  Appearance: Cloudy Ketones: Trace RBC/hpf: 0 - 2/hpf  Specific Gravity: >1.030 Blood: Neg Bacteria: NS (Not Seen)  pH: 6.0 Protein: 1+ Cystals: Amorph Urates  Glucose: Neg Urobilinogen: 0.2 Casts: NS (Not Seen)    Nitrites: Neg Trichomonas: Not Present    Leukocyte Esterase: Neg Mucous: Present      Epithelial Cells: 0 - 5/hpf      Yeast: NS (Not Seen)      Sperm: Not Present    ASSESSMENT:      ICD-10 Details  1 GU:   Ureteral calculus - N20.1 Symptomatic left proximal stone. I reviewed the options including MET, URS and ESWL. He would like URS. I have reviewed the risks of ureteroscopy including bleeding, infection, ureteral injury, need for a stent or secondary procedures, thrombotic events and anesthetic complications.   2   Renal calculus - N20.0 Left   PLAN:           Orders X-Rays: KUB          Schedule Return Visit/Planned Activity: ASAP - Schedule  Surgery

## 2018-03-14 ENCOUNTER — Encounter (HOSPITAL_COMMUNITY): Payer: Self-pay | Admitting: Urology

## 2018-03-22 DIAGNOSIS — N390 Urinary tract infection, site not specified: Secondary | ICD-10-CM | POA: Diagnosis not present

## 2018-03-22 DIAGNOSIS — B957 Other staphylococcus as the cause of diseases classified elsewhere: Secondary | ICD-10-CM | POA: Diagnosis not present

## 2018-03-22 DIAGNOSIS — N200 Calculus of kidney: Secondary | ICD-10-CM | POA: Diagnosis not present

## 2018-04-07 ENCOUNTER — Encounter: Payer: Self-pay | Admitting: Nurse Practitioner

## 2018-04-07 ENCOUNTER — Ambulatory Visit: Payer: BLUE CROSS/BLUE SHIELD | Admitting: Nurse Practitioner

## 2018-04-07 VITALS — BP 126/74 | Wt 245.6 lb

## 2018-04-07 DIAGNOSIS — F902 Attention-deficit hyperactivity disorder, combined type: Secondary | ICD-10-CM | POA: Diagnosis not present

## 2018-04-07 DIAGNOSIS — Z79899 Other long term (current) drug therapy: Secondary | ICD-10-CM

## 2018-04-07 MED ORDER — AMPHETAMINE-DEXTROAMPHETAMINE 10 MG PO TABS: ORAL_TABLET | ORAL | 0 refills | Status: DC

## 2018-04-08 ENCOUNTER — Encounter: Payer: Self-pay | Admitting: Nurse Practitioner

## 2018-04-08 DIAGNOSIS — F902 Attention-deficit hyperactivity disorder, combined type: Secondary | ICD-10-CM | POA: Insufficient documentation

## 2018-04-08 NOTE — Progress Notes (Signed)
Subjective:  Presents to discuss possible ADHD symptoms. Very distracted. Trouble completing tasks. Just completed nursing school. Had to have strict control of his environment when studying. Began noticing symptoms around age 27. Symptoms were much worse in college. 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 [facility]. Has received Botox injections for his migraines. Denies any history of heart problems. No CP/ischemic type pain or SOB. No palpitations. Anxiety stable.  See ADHD self report screening.    Objective:   BP 126/74   Wt 245 lb 9.6 oz (111.4 kg)   BMI 36.27 kg/m  NAD. Alert, oriented. Lungs clear. Heart RRR. Mildly anxious affect. Thoughts logical, coherent and relevant. Dressed appropriately in scrubs for work. Making good eye contact. EKG normal.   Assessment:  Problem List Items Addressed This Visit      Other   Attention deficit hyperactivity disorder (ADHD), combined type - Primary    Other Visit Diagnoses    High risk medication use       Relevant Orders   PR ELECTROCARDIOGRAM, COMPLETE       Plan:   Meds ordered this encounter  Medications  . amphetamine-dextroamphetamine (ADDERALL) 10 MG tablet    Sig: Take one po qam then one po 4 hours later    Dispense:  60 tablet    Refill:  0    Order Specific Question:   Supervising Provider    Answer:   Riccardo DubinLUKING, WILLIAM S [[id number]]   Start with low dose Adderall. Plan to titrate based on patient feedback. Reviewed potential adverse effects. DC med and contact office if any problems.  Return in about 3 months (around 07/07/2018) for recheck.

## 2018-04-13 ENCOUNTER — Encounter: Payer: Self-pay | Admitting: Family Medicine

## 2018-05-07 ENCOUNTER — Inpatient Hospital Stay
Admission: AD | Admit: 2018-05-07 | Payer: BLUE CROSS/BLUE SHIELD | Source: Other Acute Inpatient Hospital | Admitting: Family Medicine

## 2018-05-07 ENCOUNTER — Encounter: Payer: Self-pay | Admitting: Internal Medicine

## 2018-05-07 DIAGNOSIS — R0789 Other chest pain: Secondary | ICD-10-CM | POA: Diagnosis not present

## 2018-05-07 DIAGNOSIS — I21A1 Myocardial infarction type 2: Secondary | ICD-10-CM | POA: Diagnosis not present

## 2018-05-07 DIAGNOSIS — R51 Headache: Secondary | ICD-10-CM | POA: Diagnosis not present

## 2018-05-07 DIAGNOSIS — Z882 Allergy status to sulfonamides status: Secondary | ICD-10-CM | POA: Diagnosis not present

## 2018-05-07 DIAGNOSIS — I471 Supraventricular tachycardia: Secondary | ICD-10-CM | POA: Diagnosis not present

## 2018-05-07 DIAGNOSIS — R55 Syncope and collapse: Secondary | ICD-10-CM | POA: Diagnosis not present

## 2018-05-07 DIAGNOSIS — R748 Abnormal levels of other serum enzymes: Secondary | ICD-10-CM | POA: Diagnosis not present

## 2018-05-07 DIAGNOSIS — G43909 Migraine, unspecified, not intractable, without status migrainosus: Secondary | ICD-10-CM | POA: Diagnosis not present

## 2018-05-07 DIAGNOSIS — I472 Ventricular tachycardia: Secondary | ICD-10-CM | POA: Diagnosis not present

## 2018-05-07 DIAGNOSIS — Z800 Family history of malignant neoplasm of digestive organs: Secondary | ICD-10-CM | POA: Diagnosis not present

## 2018-05-07 DIAGNOSIS — Z87442 Personal history of urinary calculi: Secondary | ICD-10-CM | POA: Diagnosis not present

## 2018-05-07 DIAGNOSIS — F988 Other specified behavioral and emotional disorders with onset usually occurring in childhood and adolescence: Secondary | ICD-10-CM | POA: Diagnosis not present

## 2018-05-07 DIAGNOSIS — R7989 Other specified abnormal findings of blood chemistry: Secondary | ICD-10-CM | POA: Diagnosis not present

## 2018-05-08 ENCOUNTER — Encounter: Payer: Self-pay | Admitting: Internal Medicine

## 2018-05-08 DIAGNOSIS — I471 Supraventricular tachycardia: Secondary | ICD-10-CM | POA: Diagnosis not present

## 2018-05-08 DIAGNOSIS — R748 Abnormal levels of other serum enzymes: Secondary | ICD-10-CM | POA: Diagnosis not present

## 2018-05-08 DIAGNOSIS — F988 Other specified behavioral and emotional disorders with onset usually occurring in childhood and adolescence: Secondary | ICD-10-CM | POA: Diagnosis not present

## 2018-05-08 DIAGNOSIS — I472 Ventricular tachycardia: Secondary | ICD-10-CM | POA: Diagnosis not present

## 2018-05-08 DIAGNOSIS — G43909 Migraine, unspecified, not intractable, without status migrainosus: Secondary | ICD-10-CM | POA: Diagnosis not present

## 2018-05-09 ENCOUNTER — Ambulatory Visit (INDEPENDENT_AMBULATORY_CARE_PROVIDER_SITE_OTHER): Payer: BLUE CROSS/BLUE SHIELD | Admitting: Internal Medicine

## 2018-05-09 ENCOUNTER — Encounter: Payer: Self-pay | Admitting: *Deleted

## 2018-05-09 ENCOUNTER — Encounter: Payer: Self-pay | Admitting: Internal Medicine

## 2018-05-09 ENCOUNTER — Other Ambulatory Visit (HOSPITAL_COMMUNITY)
Admission: RE | Admit: 2018-05-09 | Discharge: 2018-05-09 | Disposition: A | Discharge status: Home / Self Care | Payer: BLUE CROSS/BLUE SHIELD | Source: Ambulatory Visit | Attending: Internal Medicine | Admitting: Internal Medicine

## 2018-05-09 VITALS — BP 124/80 | HR 118 | Ht 67.0 in | Wt 244.0 lb

## 2018-05-09 DIAGNOSIS — R000 Tachycardia, unspecified: Secondary | ICD-10-CM | POA: Insufficient documentation

## 2018-05-09 LAB — BASIC METABOLIC PANEL
Anion gap: 9 (ref 5–15)
BUN: 18 mg/dL (ref 6–20)
CO2: 32 mmol/L (ref 22–32)
Calcium: 9.8 mg/dL (ref 8.9–10.3)
Chloride: 95 mmol/L — ABNORMAL LOW (ref 101–111)
Creatinine, Ser: 1.14 mg/dL (ref 0.61–1.24)
GFR calc Af Amer: >60 mL/min (ref >60)
GFR calc non Af Amer: >60 mL/min (ref >60)
Glucose, Bld: 98 mg/dL (ref 65–99)
Potassium: 4 mmol/L (ref 3.5–5.1)
Sodium: 136 mmol/L (ref 135–145)

## 2018-05-09 LAB — CBC WITH DIFFERENTIAL/PLATELET
Basophils Absolute: 0 10*3/uL (ref 0.0–0.1)
Basophils Relative: 0 %
Eosinophils Absolute: 0.1 10*3/uL (ref 0.0–0.7)
Eosinophils Relative: 1 %
HCT: 45.7 % (ref 39.0–52.0)
Hemoglobin: 15.6 g/dL (ref 13.0–17.0)
Lymphocytes Relative: 52 %
Lymphs Abs: 3.7 10*3/uL (ref 0.7–4.0)
MCH: 29.8 pg (ref 26.0–34.0)
MCHC: 34.1 g/dL (ref 30.0–36.0)
MCV: 87.4 fL (ref 78.0–100.0)
Monocytes Absolute: 0.8 10*3/uL (ref 0.1–1.0)
Monocytes Relative: 10 %
Neutro Abs: 2.7 10*3/uL (ref 1.7–7.7)
Neutrophils Relative %: 37 %
Platelets: 223 10*3/uL (ref 150–400)
RBC: 5.23 MIL/uL (ref 4.22–5.81)
RDW: 12.5 % (ref 11.5–15.5)
WBC: 7.3 10*3/uL (ref 4.0–10.5)

## 2018-05-09 LAB — PROTIME-INR
INR: 0.99
Prothrombin Time: 13 seconds (ref 11.4–15.2)

## 2018-05-09 NOTE — Patient Instructions (Signed)
Medication Instructions:  Your physician recommends that you continue on your current medications as directed. Please refer to the Current Medication list given to you today.   Labwork: NONE   Testing/Procedures: Your physician has recommended that you have an ablation. Catheter ablation is a medical procedure used to treat some cardiac arrhythmias (irregular heartbeats). During catheter ablation, a long, thin, flexible tube is put into a blood vessel in your groin (upper thigh), or neck. This tube is called an ablation catheter. It is then guided to your heart through the blood vessel. Radio frequency waves destroy small areas of heart tissue where abnormal heartbeats may cause an arrhythmia to start. Please see the instruction sheet given to you today.  Your physician has recommended that you have an EP Study. This test is used to assess serious arrhythmias (irregular heartbeats). During an Electro-physiology Study (EPS), a thin, flexible wire is passed through a vein in your groin (upper thigh) or neck up to the heart. The wire records the heart's electrical signals. Your doctor uses the wire to electrically stimulate your heart and trigger an arrhythmic. This allows the doctor to see whether an antiarrhythmia medicine can help manage the problem or if further procedures are necessary (i.e., ablation/ICD). Radiofrequency ablation, a procedure used to fix some types of arrthythmia, may be done during an EPS. This is done in the hospital and often requires an overnight stay. Please see the instruction sheet given to your today for more information.    Follow-Up: Your physician recommends that you schedule a follow-up appointment after Ablation and EP study.    Any Other Special Instructions Will Be Listed Below (If Applicable). Dates to Ablation with EP study are:  May 24, 28, 30 June 3, 7, 10, 14     If you need a refill on your cardiac medications before your next appointment, please call  your pharmacy. Thank you for choosing [facility]!

## 2018-05-09 NOTE — H&P (View-Only) (Signed)
HPI The patient is a 27 yo man referred by Dr. Johney Frame and Deborah Chalk for evaluation of a wide QRS tachycardia. He presented with a LBBB, left superior axis tachycardia at 240/min,  2 days ago to the [facility] hospital for which he was treated with IV adenosine which reduced the tachycardia from 220/min to 150/min. We do not have strips of the slower rates. His rates then went down to the 110 range. He was given oral diltiazem. The patient experienced near syncope, chest pressure, and sob. He also has a h/o migraines and multiple renal stones. He does take Adderall.  He has had a couple of episodes in the past for which he did not seek medical attention which self terminated.  Allergies  Allergen Reactions  . Bactrim [Sulfamethoxazole-Trimethoprim] Nausea And Vomiting  . Codeine Swelling    Lips swell//  This includes cough syrup w/ codeine  . Adhesive [Tape] Rash    Paper tape ok     Current Outpatient Medications  Medication Sig Dispense Refill  . amphetamine-dextroamphetamine (ADDERALL) 10 MG tablet Take one po qam then one po 4 hours later 60 tablet 0  . BOTOX 100 units SOLR injection INJECT IN THE MUSCLE TO HEAD AND NECK MUSCLES EVERY 3 MONTHS BY PROVIDER 2 each 2  . docusate sodium (COLACE) 100 MG capsule Take 2 capsules (200 mg total) by mouth 2 (two) times daily. 120 capsule 11  . hydrochlorothiazide (HYDRODIURIL) 25 MG tablet Take 25 mg by mouth 2 (two) times daily.    Marland Kitchen ibuprofen (ADVIL,MOTRIN) 200 MG tablet Take 200 mg by mouth every 6 (six) hours as needed for headache or mild pain.    Marland Kitchen lamoTRIgine (LAMICTAL) 100 MG tablet One po bid 180 tablet 3  . levETIRAcetam (KEPPRA) 750 MG tablet TAKE 1 TABLET BY MOUTH EVERY MORNING AND 2 TABLETS EVERY NIGHT AT BEDTIME 270 tablet 3  . ondansetron (ZOFRAN) 4 MG tablet Take 1 tablet (4 mg total) by mouth every 8 (eight) hours as needed for nausea or vomiting. 4 tablet 0  . oxybutynin (DITROPAN) 5 MG tablet Take 1 tablet (5 mg total) by mouth  every 8 (eight) hours as needed for bladder spasms. 30 tablet 3  . oxycodone (OXY-IR) 5 MG capsule Take 1-2 capsules (5-10 mg total) by mouth every 4 (four) hours as needed. 20 capsule 0  . potassium chloride SA (KLOR-CON M15) 15 MEQ tablet Take 30 mEq by mouth 2 (two) times daily. Pt takes 2 tabs, twice a day     . promethazine (PHENERGAN) 25 MG tablet Take 1 tablet (25 mg total) by mouth every 6 (six) hours as needed for nausea or vomiting. 10 tablet 0  . rizatriptan (MAXALT-MLT) 10 MG disintegrating tablet DISSOLVE 1 TABLET ON THE TONGUE EVERY DAY AS NEEDED FOR MIGRAINE. MAY REPEAT IN 2 HOURS IF NEEDED. MAX OF 2 TABLETS PER DAY 30 tablet 3  . tamsulosin (FLOMAX) 0.4 MG CAPS capsule 1 q HS to aid stone passage 7 capsule 0   No current facility-administered medications for this visit.      Past Medical History:  Diagnosis Date  . Complication of anesthesia   . Frequency-urgency syndrome   . History of kidney stones   . Hx of nausea and vomiting    d/t kidney stone  . Hx of sepsis 08/11/2017   due to kidney stone/ hydronephrosis  . Left ureteral stone   . Migraine   . Pain due to ureteral stent (HCC)   .  PONV (postoperative nausea and vomiting)    none recently  . Renal calculi    bilateral per ct 07-26-2016  . Scoliosis of lumbar spine 1994   treated at [facility] until age 71  . Wears glasses     ROS:   All systems reviewed and negative except as noted in the HPI.   Past Surgical History:  Procedure Laterality Date  . CYSTOSCOPY W/ RETROGRADES Right 07/06/2015   Procedure: CYSTOSCOPY WITH RETROGRADE PYELOGRAM;  Surgeon: Jerilee Field, MD;  Location: [facility];  Service: Urology;  Laterality: Right;  . CYSTOSCOPY W/ URETERAL STENT PLACEMENT Left 08/08/2015   Procedure: CYSTOSCOPY WITH STENT REPLACEMENT;  Surgeon: Jerilee Field, MD;  Location: [facility];  Service: Urology;  Laterality: Left;  . CYSTOSCOPY W/ URETERAL STENT PLACEMENT Left 02/21/2017   Procedure:  CYSTOSCOPY WITH RETROGRADE PYELOGRAM/URETERAL LEFT STENT PLACEMENT WITH  LASER;  Surgeon: Bjorn Pippin, MD;  Location: [facility];  Service: Urology;  Laterality: Left;  . CYSTOSCOPY W/ URETERAL STENT PLACEMENT Left 08/10/2017   Procedure: CYSTOSCOPY WITH RETROGRADE PYELOGRAM/URETERAL STENT PLACEMENT;  Surgeon: Heloise Purpura, MD;  Location: [facility];  Service: Urology;  Laterality: Left;  . CYSTOSCOPY WITH RETROGRADE PYELOGRAM, URETEROSCOPY AND STENT PLACEMENT Left 06/24/2014   Procedure: CYSTOSCOPY WITH RETROGRADE PYELOGRAM,  AND STENT PLACEMENT;  Surgeon: Magdalene Molly, MD;  Location: [facility];  Service: Urology;  Laterality: Left;  . CYSTOSCOPY WITH RETROGRADE PYELOGRAM, URETEROSCOPY AND STENT PLACEMENT Left 07/05/2014   Procedure: CYSTO/LEFT URETEROSCOPY/LEFT RETROGRADE PYELOGRAM/LEFT STENT PLACEMENT;  Surgeon: Jerilee Field, MD;  [facility];  Service: Urology;  Laterality: Left;  . CYSTOSCOPY WITH RETROGRADE PYELOGRAM, URETEROSCOPY AND STENT PLACEMENT Left 07/06/2015   Procedure: CYSTOSCOPY WITH RETROGRADE PYELOGRAM, URETEROSCOPY , LASER, STENT PLACEMENT and BASKET EXTRACTION;  Surgeon: Jerilee Field, MD;  Location: [facility];  Service: Urology;  Laterality: Left;  . CYSTOSCOPY WITH RETROGRADE PYELOGRAM, URETEROSCOPY AND STENT PLACEMENT Left 08/19/2015   Procedure: CYSTOSCOPY WITH RETROGRADE PYELOGRAM, URETEROSCOPY AND STENT PLACEMENT;  Surgeon: Jerilee Field, MD;  Location: [facility];  Service: Urology;  Laterality: Left;  . CYSTOSCOPY WITH RETROGRADE PYELOGRAM, URETEROSCOPY AND STENT PLACEMENT Left 03/13/2018   Procedure: CYSTOSCOPY WITH RETROGRADE PYELOGRAM, URETEROSCOPY AND LEFT STENT PLACEMENT;  Surgeon: Bjorn Pippin, MD;  Location: [facility];  Service: Urology;  Laterality: Left;  . CYSTOSCOPY WITH URETEROSCOPY AND STENT PLACEMENT Left 01/16/2016   Procedure: CYSTOSCOPY WITH LEFT RETROGRADE PYELOGRAM  LEFT DIGITAL URETEROSCOPY AND PLACEMENT LEFT URETERAL STENT;  Surgeon: Jerilee Field, MD;  Location: [facility];  Service: Urology;  Laterality: Left;  . CYSTOSCOPY WITH URETEROSCOPY, STONE BASKETRY AND STENT PLACEMENT Left 02/13/2016   Procedure: CYSTOSCOPY WITH LEFT URETEROSCOPY, HOLMIUM LASER AND STENT PLACEMENT;  Surgeon: Jerilee Field, MD;  Location: [facility];  Service: Urology;  Laterality: Left;  . CYSTOSCOPY/RETROGRADE/URETEROSCOPY/STONE EXTRACTION WITH BASKET Left 08/08/2015   Procedure: CYSTOSCOPY/RETROGRADE/URETEROSCOPY/STONE EXTRACTION WITH BASKET;  Surgeon: Jerilee Field, MD;  Location: [facility];  Service: Urology;  Laterality: Left;  . CYSTOSCOPY/URETEROSCOPY/HOLMIUM LASER/STENT PLACEMENT Left 07/30/2016   Procedure: CYSTOSCOPY/URETEROSCOPY/HOLMIUM LASER/STENT PLACEMENT;  Surgeon: Jerilee Field, MD;  Location: [facility];  Service: Urology;  Laterality: Left;  . CYSTOSCOPY/URETEROSCOPY/HOLMIUM LASER/STENT PLACEMENT Left 08/19/2017   Procedure: CYSTOSCOPY/URETEROSCOPY/HOLMIUM LASER/STENT PLACEMENT;  Surgeon: Jerilee Field, MD;  Location: [facility];  Service: Urology;  Laterality: Left;  . EXTRACORPOREAL SHOCK WAVE LITHOTRIPSY Left 07-07-2015  &  12-26-2014  . 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  HOLMIUM LASER APPLICATION Left 07/05/2014   Procedure: LASER LITHO;  Surgeon: Jerilee Field, MD;  Location: [facility];  Service: Urology;  Laterality: Left;  . HOLMIUM LASER APPLICATION Left 08/08/2015   Procedure: HOLMIUM LASER  WITH LITHOTRIPSY ;  Surgeon: Jerilee Field, MD;  Location[facility];  Service: Urology;  Laterality: Left;  . HOLMIUM LASER APPLICATION Left 02/21/2017   Procedure: HOLMIUM LASER APPLICATION;  Surgeon: Bjorn Pippin, MD;  Location: [facility];  Service: Urology;  Laterality: Left;  . HOLMIUM LASER APPLICATION Left 03/13/2018   Procedure: HOLMIUM LASER APPLICATION;  Surgeon: Bjorn Pippin, MD;   Location: [facility];  Service: Urology;  Laterality: Left;  . LYMPH GLAND EXCISION  2003   neck--  benign     Family History  Problem Relation Age of Onset  . Cancer Father   . Kidney Stones Mother   . Cancer Other   . Hypertension Other   . Hyperlipidemia Other   . Stroke Other   . Kidney Stones Brother      Social History   Socioeconomic History  . Marital status: Single    Spouse name: Not on file  . Number of children: Not on file  . Years of education: Not on file  . Highest education level: Not on file  Occupational History  . Not on file  Social Needs  . Financial resource strain: Not on file  . Food insecurity:    Worry: Not on file    Inability: Not on file  . Transportation needs:    Medical: Not on file    Non-medical: Not on file  Tobacco Use  . Smoking status: Former Smoker    Years: 2.00    Last attempt to quit: 07/29/2014    Years since quitting: 3.7  . Smokeless tobacco: Never Used  . Tobacco comment: social smoker  Substance and Sexual Activity  . Alcohol use: Yes    Alcohol/week: 0.0 oz    Comment: occ  . Drug use: No  . Sexual activity: Never  Lifestyle  . Physical activity:    Days per week: Not on file    Minutes per session: Not on file  . Stress: Not on file  Relationships  . Social connections:    Talks on phone: Not on file    Gets together: Not on file    Attends religious service: Not on file    Active member of club or organization: Not on file    Attends meetings of clubs or organizations: Not on file    Relationship status: Not on file  . Intimate partner violence:    Fear of current or ex partner: Not on file    Emotionally abused: Not on file    Physically abused: Not on file    Forced sexual activity: Not on file  Other Topics Concern  . Not on file  Social History Narrative  . Not on file     Pulse (!) 118   Ht  (1.702 m)   Wt 244 lb (110.7 kg)   SpO2 98%   BMI 38.22 kg/m   Physical Exam:  Well  appearing 27 yo man, NAD HEENT: Unremarkable Neck:  6 cm JVD, no thyromegally Lymphatics:  No adenopathy Back:  No CVA tenderness Lungs:  Clear with no wheezes HEART:  Regular rate rhythm, no murmurs, no rubs, no clicks Abd:  soft, positive bowel sounds, no organomegally, no rebound, no guarding Ext:  2 plus pulses,  no edema, no cyanosis, no clubbing Skin:  No rashes no nodules Neuro:  CN II through XII intact, motor grossly intact  EKG - NSR ECG in tachycardia - as above.  Assess/Plan: 1. Wide QRS tachy - most likely SVT with aberrancy. There is a hint of pre-excitation on his old ECG's. He could also have anti-dromic SVT or even more rare, VT originating from the RV apex with the negative concordance. I have discussed the treatment options with the patient and the risks/benefits/goals/expectations of EP study and catheter ablation were reviewed and he wishes to proceed. He will call us to schedule an ablation.  Thomas Howell.D.

## 2018-05-09 NOTE — Progress Notes (Signed)
    HPI The patient is a 26 yo man referred by Dr. Allred and Kuk for evaluation of a wide QRS tachycardia. He presented with a LBBB, left superior axis tachycardia at 240/min,  2 days ago to the [place] [facility] for which he was treated with IV adenosine which reduced the tachycardia from 220/min to 150/min. We do not have strips of the slower rates. His rates then went down to the 110 range. He was given oral diltiazem. The patient experienced near syncope, chest pressure, and sob. He also has a h/o migraines and multiple renal stones. He does take Adderall.  He has had a couple of episodes in the past for which he did not seek medical attention which self terminated.  Allergies  Allergen Reactions  . Bactrim [Sulfamethoxazole-Trimethoprim] Nausea And Vomiting  . Codeine Swelling    Lips swell//  This includes cough syrup w/ codeine  . Adhesive [Tape] Rash    Paper tape ok     Current Outpatient Medications  Medication Sig Dispense Refill  . amphetamine-dextroamphetamine (ADDERALL) 10 MG tablet Take one po qam then one po 4 hours later 60 tablet 0  . BOTOX 100 units SOLR injection INJECT IN THE MUSCLE TO HEAD AND NECK MUSCLES EVERY 3 MONTHS BY PROVIDER 2 each 2  . docusate sodium (COLACE) 100 MG capsule Take 2 capsules (200 mg total) by mouth 2 (two) times daily. 120 capsule 11  . hydrochlorothiazide (HYDRODIURIL) 25 MG tablet Take 25 mg by mouth 2 (two) times daily.    . ibuprofen (ADVIL,MOTRIN) 200 MG tablet Take 200 mg by mouth every 6 (six) hours as needed for headache or mild pain.    . lamoTRIgine (LAMICTAL) 100 MG tablet One po bid 180 tablet 3  . levETIRAcetam (KEPPRA) 750 MG tablet TAKE 1 TABLET BY MOUTH EVERY MORNING AND 2 TABLETS EVERY NIGHT AT BEDTIME 270 tablet 3  . ondansetron (ZOFRAN) 4 MG tablet Take 1 tablet (4 mg total) by mouth every 8 (eight) hours as needed for nausea or vomiting. 4 tablet 0  . oxybutynin (DITROPAN) 5 MG tablet Take 1 tablet (5 mg total) by mouth  every 8 (eight) hours as needed for bladder spasms. 30 tablet 3  . oxycodone (OXY-IR) 5 MG capsule Take 1-2 capsules (5-10 mg total) by mouth every 4 (four) hours as needed. 20 capsule 0  . potassium chloride SA (KLOR-CON M15) 15 MEQ tablet Take 30 mEq by mouth 2 (two) times daily. Pt takes 2 tabs, twice a day     . promethazine (PHENERGAN) 25 MG tablet Take 1 tablet (25 mg total) by mouth every 6 (six) hours as needed for nausea or vomiting. 10 tablet 0  . rizatriptan (MAXALT-MLT) 10 MG disintegrating tablet DISSOLVE 1 TABLET ON THE TONGUE EVERY DAY AS NEEDED FOR MIGRAINE. MAY REPEAT IN 2 HOURS IF NEEDED. MAX OF 2 TABLETS PER DAY 30 tablet 3  . tamsulosin (FLOMAX) 0.4 MG CAPS capsule 1 q HS to aid stone passage 7 capsule 0   No current facility-administered medications for this visit.      Past Medical History:  Diagnosis Date  . Complication of anesthesia   . Frequency-urgency syndrome   . History of kidney stones   . Hx of nausea and vomiting    d/t kidney stone  . Hx of sepsis 08/11/2017   due to kidney stone/ hydronephrosis  . Left ureteral stone   . Migraine   . Pain due to ureteral stent (HCC)   .   PONV (postoperative nausea and vomiting)    none recently  . Renal calculi    bilateral per ct 07-26-2016  . Scoliosis of lumbar spine 1994   treated at [facility] until age 17  . Wears glasses     ROS:   All systems reviewed and negative except as noted in the HPI.   Past Surgical History:  Procedure Laterality Date  . CYSTOSCOPY W/ RETROGRADES Right 07/06/2015   Procedure: CYSTOSCOPY WITH RETROGRADE PYELOGRAM;  Surgeon: Matthew Eskridge, MD;  Location: [facility];  Service: Urology;  Laterality: Right;  . CYSTOSCOPY W/ URETERAL STENT PLACEMENT Left 08/08/2015   Procedure: CYSTOSCOPY WITH STENT REPLACEMENT;  Surgeon: Matthew Eskridge, MD;  Location: [facility];  Service: Urology;  Laterality: Left;  . CYSTOSCOPY W/ URETERAL STENT PLACEMENT Left 02/21/2017   Procedure:  CYSTOSCOPY WITH RETROGRADE PYELOGRAM/URETERAL LEFT STENT PLACEMENT WITH  LASER;  Surgeon: John Wrenn, MD;  Location: [facility];  Service: Urology;  Laterality: Left;  . CYSTOSCOPY W/ URETERAL STENT PLACEMENT Left 08/10/2017   Procedure: CYSTOSCOPY WITH RETROGRADE PYELOGRAM/URETERAL STENT PLACEMENT;  Surgeon: Borden, Lester, MD;  Location: [facility];  Service: Urology;  Laterality: Left;  . CYSTOSCOPY WITH RETROGRADE PYELOGRAM, URETEROSCOPY AND STENT PLACEMENT Left 06/24/2014   Procedure: CYSTOSCOPY WITH RETROGRADE PYELOGRAM,  AND STENT PLACEMENT;  Surgeon: Daniel Y Woodruff, MD;  Location: [facility];  Service: Urology;  Laterality: Left;  . CYSTOSCOPY WITH RETROGRADE PYELOGRAM, URETEROSCOPY AND STENT PLACEMENT Left 07/05/2014   Procedure: CYSTO/LEFT URETEROSCOPY/LEFT RETROGRADE PYELOGRAM/LEFT STENT PLACEMENT;  Surgeon: Matthew Eskridge, MD;  Location: [facility];  Service: Urology;  Laterality: Left;  . CYSTOSCOPY WITH RETROGRADE PYELOGRAM, URETEROSCOPY AND STENT PLACEMENT Left 07/06/2015   Procedure: CYSTOSCOPY WITH RETROGRADE PYELOGRAM, URETEROSCOPY , LASER, STENT PLACEMENT and BASKET EXTRACTION;  Surgeon: Matthew Eskridge, MD;  Location: [facility];  Service: Urology;  Laterality: Left;  . CYSTOSCOPY WITH RETROGRADE PYELOGRAM, URETEROSCOPY AND STENT PLACEMENT Left 08/19/2015   Procedure: CYSTOSCOPY WITH RETROGRADE PYELOGRAM, URETEROSCOPY AND STENT PLACEMENT;  Surgeon: Matthew Eskridge, MD;  Location: [facility];  Service: Urology;  Laterality: Left;  . CYSTOSCOPY WITH RETROGRADE PYELOGRAM, URETEROSCOPY AND STENT PLACEMENT Left 03/13/2018   Procedure: CYSTOSCOPY WITH RETROGRADE PYELOGRAM, URETEROSCOPY AND LEFT STENT PLACEMENT;  Surgeon: Wrenn, John, MD;  Location: [facility];  Service: Urology;  Laterality: Left;  . CYSTOSCOPY WITH URETEROSCOPY AND STENT PLACEMENT Left 01/16/2016   Procedure: CYSTOSCOPY WITH LEFT RETROGRADE PYELOGRAM  LEFT DIGITAL URETEROSCOPY AND PLACEMENT LEFT URETERAL STENT;  Surgeon: Matthew  Eskridge, MD;  Location: [facility];  Service: Urology;  Laterality: Left;  . CYSTOSCOPY WITH URETEROSCOPY, STONE BASKETRY AND STENT PLACEMENT Left 02/13/2016   Procedure: CYSTOSCOPY WITH LEFT URETEROSCOPY, HOLMIUM LASER AND STENT PLACEMENT;  Surgeon: Matthew Eskridge, MD;  Location: [facility];  Service: Urology;  Laterality: Left;  . CYSTOSCOPY/RETROGRADE/URETEROSCOPY/STONE EXTRACTION WITH BASKET Left 08/08/2015   Procedure: CYSTOSCOPY/RETROGRADE/URETEROSCOPY/STONE EXTRACTION WITH BASKET;  Surgeon: Matthew Eskridge, MD;  Location: [facility];  Service: Urology;  Laterality: Left;  . CYSTOSCOPY/URETEROSCOPY/HOLMIUM LASER/STENT PLACEMENT Left 07/30/2016   Procedure: CYSTOSCOPY/URETEROSCOPY/HOLMIUM LASER/STENT PLACEMENT;  Surgeon: Matthew Eskridge, MD;  Location: [facility];  Service: Urology;  Laterality: Left;  . CYSTOSCOPY/URETEROSCOPY/HOLMIUM LASER/STENT PLACEMENT Left 08/19/2017   Procedure: CYSTOSCOPY/URETEROSCOPY/HOLMIUM LASER/STENT PLACEMENT;  Surgeon: Eskridge, Matthew, MD;  Location: [facility];  Service: Urology;  Laterality: Left;  . EXTRACORPOREAL SHOCK WAVE LITHOTRIPSY Left 07-07-2015  &  12-26-2014  . 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   HOLMIUM LASER APPLICATION Left 07/05/2014   Procedure: LASER LITHO;  Surgeon: Matthew Eskridge, MD;  Location: [facility];  Service: Urology;  Laterality: Left;  . HOLMIUM LASER APPLICATION Left 08/08/2015   Procedure: HOLMIUM LASER  WITH LITHOTRIPSY ;  Surgeon: Matthew Eskridge, MD;  Location: [facility];  Service: Urology;  Laterality: Left;  . HOLMIUM LASER APPLICATION Left 02/21/2017   Procedure: HOLMIUM LASER APPLICATION;  Surgeon: John Wrenn, MD;  Location: [facility];  Service: Urology;  Laterality: Left;  . HOLMIUM LASER APPLICATION Left 03/13/2018   Procedure: HOLMIUM LASER APPLICATION;  Surgeon: Wrenn, John, MD;   Location: [facility];  Service: Urology;  Laterality: Left;  . LYMPH GLAND EXCISION  2003   neck--  benign     Family History  Problem Relation Age of Onset  . Cancer Father   . Kidney Stones Mother   . Cancer Other   . Hypertension Other   . Hyperlipidemia Other   . Stroke Other   . Kidney Stones Brother      Social History   Socioeconomic History  . Marital status: Single    Spouse name: Not on file  . Number of children: Not on file  . Years of education: Not on file  . Highest education level: Not on file  Occupational History  . Not on file  Social Needs  . Financial resource strain: Not on file  . Food insecurity:    Worry: Not on file    Inability: Not on file  . Transportation needs:    Medical: Not on file    Non-medical: Not on file  Tobacco Use  . Smoking status: Former Smoker    Years: 2.00    Last attempt to quit: 07/29/2014    Years since quitting: 3.7  . Smokeless tobacco: Never Used  . Tobacco comment: social smoker  Substance and Sexual Activity  . Alcohol use: Yes    Alcohol/week: 0.0 oz    Comment: occ  . Drug use: No  . Sexual activity: Never  Lifestyle  . Physical activity:    Days per week: Not on file    Minutes per session: Not on file  . Stress: Not on file  Relationships  . Social connections:    Talks on phone: Not on file    Gets together: Not on file    Attends religious service: Not on file    Active member of club or organization: Not on file    Attends meetings of clubs or organizations: Not on file    Relationship status: Not on file  . Intimate partner violence:    Fear of current or ex partner: Not on file    Emotionally abused: Not on file    Physically abused: Not on file    Forced sexual activity: Not on file  Other Topics Concern  . Not on file  Social History Narrative  . Not on file     Pulse (!) 118   Ht 5' 7" (1.702 m)   Wt 244 lb (110.7 kg)   SpO2 98%   BMI 38.22 kg/m   Physical Exam:  Well  appearing 26 yo man, NAD HEENT: Unremarkable Neck:  6 cm JVD, no thyromegally Lymphatics:  No adenopathy Back:  No CVA tenderness Lungs:  Clear with no wheezes HEART:  Regular rate rhythm, no murmurs, no rubs, no clicks Abd:  soft, positive bowel sounds, no organomegally, no rebound, no guarding Ext:  2 plus pulses,   no edema, no cyanosis, no clubbing Skin:  No rashes no nodules Neuro:  CN II through XII intact, motor grossly intact  EKG - NSR ECG in tachycardia - as above.  Assess/Plan: 1. Wide QRS tachy - most likely SVT with aberrancy. There is a hint of pre-excitation on his old ECG's. He could also have anti-dromic SVT or even more rare, VT originating from the RV apex with the negative concordance. I have discussed the treatment options with the patient and the risks/benefits/goals/expectations of EP study and catheter ablation were reviewed and he wishes to proceed. He will call us to schedule an ablation.  Aston Lieske,M.D. 

## 2018-05-11 ENCOUNTER — Encounter: Payer: Self-pay | Admitting: Internal Medicine

## 2018-05-18 ENCOUNTER — Telehealth: Payer: Self-pay | Admitting: Neurology

## 2018-05-18 NOTE — Telephone Encounter (Signed)
I called a refill request in for Botox to [facility] [phone].

## 2018-05-22 ENCOUNTER — Encounter (HOSPITAL_COMMUNITY)
Admission: RE | Disposition: A | Discharge status: Home / Self Care | Payer: Self-pay | Source: Ambulatory Visit | Attending: Internal Medicine

## 2018-05-22 ENCOUNTER — Ambulatory Visit (HOSPITAL_COMMUNITY)
Admission: RE | Admit: 2018-05-22 | Discharge: 2018-05-22 | Disposition: A | Discharge status: Home / Self Care | Payer: BLUE CROSS/BLUE SHIELD | Source: Ambulatory Visit | Attending: Internal Medicine | Admitting: Internal Medicine

## 2018-05-22 DIAGNOSIS — I471 Supraventricular tachycardia, unspecified: Secondary | ICD-10-CM

## 2018-05-22 DIAGNOSIS — I447 Left bundle-branch block, unspecified: Secondary | ICD-10-CM | POA: Diagnosis not present

## 2018-05-22 DIAGNOSIS — Z87891 Personal history of nicotine dependence: Secondary | ICD-10-CM | POA: Insufficient documentation

## 2018-05-22 DIAGNOSIS — Z885 Allergy status to narcotic agent status: Secondary | ICD-10-CM | POA: Insufficient documentation

## 2018-05-22 DIAGNOSIS — Z882 Allergy status to sulfonamides status: Secondary | ICD-10-CM | POA: Insufficient documentation

## 2018-05-22 DIAGNOSIS — G43909 Migraine, unspecified, not intractable, without status migrainosus: Secondary | ICD-10-CM | POA: Diagnosis not present

## 2018-05-22 DIAGNOSIS — M419 Scoliosis, unspecified: Secondary | ICD-10-CM | POA: Insufficient documentation

## 2018-05-22 HISTORY — PX: SVT ABLATION: EP1225

## 2018-05-22 SURGERY — SVT ABLATION

## 2018-05-22 MED ORDER — ISOPROTERENOL HCL 0.2 MG/ML IJ SOLN
INTRAMUSCULAR | Status: AC
  Filled 2018-05-22: qty 5

## 2018-05-22 MED ORDER — MIDAZOLAM HCL 5 MG/5ML IJ SOLN
INTRAMUSCULAR | Status: AC
  Filled 2018-05-22: qty 5

## 2018-05-22 MED ORDER — FENTANYL CITRATE (PF) 100 MCG/2ML IJ SOLN
INTRAMUSCULAR | Status: AC
  Filled 2018-05-22: qty 2

## 2018-05-22 MED ORDER — ONDANSETRON HCL 4 MG/2ML IJ SOLN: 4.0000 mg | Freq: Four times a day (QID) | INTRAMUSCULAR | Status: DC | PRN

## 2018-05-22 MED ORDER — SODIUM CHLORIDE 0.9 % IV SOLN
INTRAVENOUS | Status: DC | PRN
  Administered 2018-05-22: 1 ug/min
  Administered 2018-05-22: 4 ug/min via INTRAVENOUS
  Administered 2018-05-22: 4 ug/min

## 2018-05-22 MED ORDER — ACETAMINOPHEN 325 MG PO TABS: 650.0000 mg | ORAL_TABLET | ORAL | Status: DC | PRN

## 2018-05-22 MED ORDER — BUPIVACAINE HCL (PF) 0.25 % IJ SOLN
INTRAMUSCULAR | Status: AC
  Filled 2018-05-22: qty 30

## 2018-05-22 MED ORDER — MIDAZOLAM HCL 5 MG/5ML IJ SOLN
INTRAMUSCULAR | Status: DC | PRN
  Administered 2018-05-22 (×11): 1 mg via INTRAVENOUS
  Administered 2018-05-22 (×2): 2 mg via INTRAVENOUS
  Administered 2018-05-22 (×4): 1 mg via INTRAVENOUS

## 2018-05-22 MED ORDER — BUPIVACAINE HCL (PF) 0.25 % IJ SOLN
INTRAMUSCULAR | Status: DC | PRN
  Administered 2018-05-22: 60 mL

## 2018-05-22 MED ORDER — SODIUM CHLORIDE 0.9 % IV SOLN: 250.0000 mL | INTRAVENOUS | Status: DC | PRN

## 2018-05-22 MED ORDER — HEPARIN (PORCINE) IN NACL 2-0.9 UNITS/ML
INTRAMUSCULAR | Status: AC | PRN
  Administered 2018-05-22: 500 mL

## 2018-05-22 MED ORDER — FENTANYL CITRATE (PF) 100 MCG/2ML IJ SOLN
INTRAMUSCULAR | Status: DC | PRN
  Administered 2018-05-22 (×10): 12.5 ug via INTRAVENOUS
  Administered 2018-05-22 (×2): 25 ug via INTRAVENOUS
  Administered 2018-05-22 (×2): 12.5 ug via INTRAVENOUS
  Administered 2018-05-22: 25 ug via INTRAVENOUS
  Administered 2018-05-22: 12.5 ug via INTRAVENOUS

## 2018-05-22 MED ORDER — SODIUM CHLORIDE 0.9% FLUSH: 3.0000 mL | Freq: Two times a day (BID) | INTRAVENOUS | Status: DC

## 2018-05-22 MED ORDER — SODIUM CHLORIDE 0.9 % IV SOLN
INTRAVENOUS | Status: DC
  Administered 2018-05-22: 06:00:00 via INTRAVENOUS

## 2018-05-22 MED ORDER — SODIUM CHLORIDE 0.9% FLUSH: 3.0000 mL | INTRAVENOUS | Status: DC | PRN

## 2018-05-22 SURGICAL SUPPLY — 12 items
BAG SNAP BAND KOVER 36X36 (MISCELLANEOUS) ×2 IMPLANT
CATH EZ STEER NAV 4MM D-F CUR (ABLATOR) ×2 IMPLANT
CATH HEX JOSEPH 2-5-2 65CM 6F (CATHETERS) ×2 IMPLANT
CATH JOSEPHSON QUAD-ALLRED 6FR (CATHETERS) ×4 IMPLANT
PACK EP LATEX FREE (CUSTOM PROCEDURE TRAY) ×1
PACK EP LF (CUSTOM PROCEDURE TRAY) ×1 IMPLANT
PAD DEFIB LIFELINK (PAD) ×2 IMPLANT
PATCH CARTO3 (PAD) ×2 IMPLANT
SHEATH AVANTI 11CM 7FR (SHEATH) ×2 IMPLANT
SHEATH PINNACLE 6F 10CM (SHEATH) ×4 IMPLANT
SHEATH PINNACLE 8F 10CM (SHEATH) ×2 IMPLANT
SHIELD RADPAD SCOOP 12X17 (MISCELLANEOUS) ×2 IMPLANT

## 2018-05-22 NOTE — Interval H&P Note (Signed)
History and Physical Interval Note:  05/22/2018 7:14 AM  Thomas Howell  has presented today for surgery, with the diagnosis of SVT  The various methods of treatment have been discussed with the patient and family. After consideration of risks, benefits and other options for treatment, the patient has consented to  Procedure(s): SVT ABLATION (N/A) as a surgical intervention .  The patient's history has been reviewed, patient examined, no change in status, stable for surgery.  I have reviewed the patient's chart and labs.  Questions were answered to the patient's satisfaction.     Lewayne BuntingGregg Taylor

## 2018-05-22 NOTE — Progress Notes (Signed)
Dr Ladona Ridgelaylor in and ok to discharge per Dr Ladona Ridgelaylor

## 2018-05-22 NOTE — Discharge Instructions (Signed)
**Note Thomas Howell-identified via Obfuscation** Cardiac Ablation, Care After °This sheet gives you information about how to care for yourself after your procedure. Your health care provider may also give you more specific instructions. If you have problems or questions, contact your health care provider. °What can I expect after the procedure? °After the procedure, it is common to have: °· Bruising around your puncture site. °· Tenderness around your puncture site. °· Skipped heartbeats. °· Tiredness (fatigue). ° °Follow these instructions at home: °Puncture site care °· Follow instructions from your health care provider about how to take care of your puncture site. Make sure you: °? Wash your hands with soap and water before you change your bandage (dressing). If soap and water are not available, use hand sanitizer. °? Change your dressing as told by your health care provider. °? Leave stitches (sutures), skin glue, or adhesive strips in place. These skin closures may need to stay in place for up to 2 weeks. If adhesive strip edges start to loosen and curl up, you may trim the loose edges. Do not remove adhesive strips completely unless your health care provider tells you to do that. °· Check your puncture site every day for signs of infection. Check for: °? Redness, swelling, or pain. °? Fluid or blood. If your puncture site starts to bleed, lie down on your back, apply firm pressure to the area, and contact your health care provider. °? Warmth. °? Pus or a bad smell. °Driving °· Ask your health care provider when it is safe for you to drive again after the procedure. °· Do not drive or use heavy machinery while taking prescription pain medicine. °· Do not drive for 24 hours if you were given a medicine to help you relax (sedative) during your procedure. °Activity °· Avoid activities that take a lot of effort for at least 3 days after your procedure. °· Do not lift anything that is heavier than 10 lb (4.5 kg), or the limit that you are told, until your health  care provider says that it is safe. °· Return to your normal activities as told by your health care provider. Ask your health care provider what activities are safe for you. °General instructions °· Take over-the-counter and prescription medicines only as told by your health care provider. °· Do not use any products that contain nicotine or tobacco, such as cigarettes and e-cigarettes. If you need help quitting, ask your health care provider. °· Do not take baths, swim, or use a hot tub until your health care provider approves. °· Do not drink alcohol for 24 hours after your procedure. °· Keep all follow-up visits as told by your health care provider. This is important. °Contact a health care provider if: °· You have redness, mild swelling, or pain around your puncture site. °· You have fluid or blood coming from your puncture site that stops after applying firm pressure to the area. °· Your puncture site feels warm to the touch. °· You have pus or a bad smell coming from your puncture site. °· You have a fever. °· You have chest pain or discomfort that spreads to your neck, jaw, or arm. °· You are sweating a lot. °· You feel nauseous. °· You have a fast or irregular heartbeat. °· You have shortness of breath. °· You are dizzy or light-headed and feel the need to lie down. °· You have pain or numbness in the arm or leg closest to your puncture site. °Get help right away if: °· Your puncture  **Note Thomas Howell-identified via Obfuscation** site suddenly swells. °· Your puncture site is bleeding and the bleeding does not stop after applying firm pressure to the area. °These symptoms may represent a serious problem that is an emergency. Do not wait to see if the symptoms will go away. Get medical help right away. Call your local emergency services (911 in the [place] Do not drive yourself to the hospital. °Summary °· After the procedure, it is normal to have bruising and tenderness at the puncture site in your groin, neck, or forearm. °· Check your puncture site every  day for signs of infection. °· Get help right away if your puncture site is bleeding and the bleeding does not stop after applying firm pressure to the area. This is a medical emergency. °This information is not intended to replace advice given to you by your health care provider. Make sure you discuss any questions you have with your health care provider. °Document Released: 03/17/2017 Document Revised: 03/17/2017 Document Reviewed: 03/17/2017 °Elsevier Interactive Patient Education © 2018 [employer] ° °

## 2018-05-22 NOTE — Progress Notes (Signed)
Pt has not had any complication. Discharge instructions given to pt and mom and aunt.  All verbalize understanding and deny further questions.  Dr Ladona Ridgelaylor was called to come assess pt before discharge but he is in a case in cath lab.  Pt and family waiting and kept informed.  Report given to Lelon FrohlichMelanie Wright,RN

## 2018-05-22 NOTE — Progress Notes (Signed)
Site area: Right groin a 6 french X2, and 9 french venous sheath was removed  Site Prior to Removal:  Level 0  Pressure Applied For 15 MINUTES    Bedrest Beginning at 1130am  Manual:   Yes.    Patient Status During Pull:  stable  Post Pull Groin Site:  Level 0  Post Pull Instructions Given:  Yes.    Post Pull Pulses Present:  Yes.    Dressing Applied:  Yes.    Comments:  VS remain stable

## 2018-05-22 NOTE — Progress Notes (Signed)
Site area: Right Internal jugular a 7 french sheath was removed  Site Prior to Removal:  Level 0  Pressure Applied For 10 MINUTES    Bedrest Beginning at 1130am  Manual:   Yes.    Patient Status During Pull:  stable  Post Pull Groin Site:  Level 0  Post Pull Instructions Given:  Yes.    Post Pull Pulses Present:  Yes.    Dressing Applied:  Yes.    Comments:  VS remain stable

## 2018-05-23 ENCOUNTER — Other Ambulatory Visit: Payer: Self-pay

## 2018-05-23 ENCOUNTER — Encounter (HOSPITAL_COMMUNITY): Payer: Self-pay | Admitting: Emergency Medicine

## 2018-05-23 ENCOUNTER — Emergency Department (HOSPITAL_COMMUNITY)
Admission: EM | Admit: 2018-05-23 | Discharge: 2018-05-23 | Disposition: A | Discharge status: Home / Self Care | Payer: BLUE CROSS/BLUE SHIELD | Attending: Emergency Medicine | Admitting: Emergency Medicine

## 2018-05-23 DIAGNOSIS — R310 Gross hematuria: Secondary | ICD-10-CM | POA: Diagnosis not present

## 2018-05-23 DIAGNOSIS — R51 Headache: Secondary | ICD-10-CM | POA: Diagnosis not present

## 2018-05-23 DIAGNOSIS — F902 Attention-deficit hyperactivity disorder, combined type: Secondary | ICD-10-CM | POA: Diagnosis not present

## 2018-05-23 DIAGNOSIS — Z79899 Other long term (current) drug therapy: Secondary | ICD-10-CM | POA: Insufficient documentation

## 2018-05-23 DIAGNOSIS — Z87891 Personal history of nicotine dependence: Secondary | ICD-10-CM | POA: Insufficient documentation

## 2018-05-23 DIAGNOSIS — R1110 Vomiting, unspecified: Secondary | ICD-10-CM

## 2018-05-23 LAB — URINALYSIS, ROUTINE W REFLEX MICROSCOPIC: RBC / HPF: >50 RBC/hpf — ABNORMAL HIGH (ref 0–5)

## 2018-05-23 LAB — I-STAT CG4 LACTIC ACID, ED
Lactic Acid, Venous: 1.9 mmol/L (ref 0.5–1.9)
Lactic Acid, Venous: 1.08 mmol/L (ref 0.5–1.9)

## 2018-05-23 LAB — COMPREHENSIVE METABOLIC PANEL
ALT: 45 U/L (ref 17–63)
AST: 35 U/L (ref 15–41)
Albumin: 4.3 g/dL (ref 3.5–5.0)
Alkaline Phosphatase: 76 U/L (ref 38–126)
Anion gap: 11 (ref 5–15)
BUN: 16 mg/dL (ref 6–20)
CO2: 24 mmol/L (ref 22–32)
Calcium: 9.5 mg/dL (ref 8.9–10.3)
Chloride: 103 mmol/L (ref 101–111)
Creatinine, Ser: 1.05 mg/dL (ref 0.61–1.24)
GFR calc Af Amer: >60 mL/min (ref >60)
GFR calc non Af Amer: >60 mL/min (ref >60)
Glucose, Bld: 115 mg/dL — ABNORMAL HIGH (ref 65–99)
Potassium: 4.1 mmol/L (ref 3.5–5.1)
Sodium: 138 mmol/L (ref 135–145)
Total Bilirubin: 0.9 mg/dL (ref 0.3–1.2)
Total Protein: 7.8 g/dL (ref 6.5–8.1)

## 2018-05-23 LAB — CBC
HCT: 50.1 % (ref 39.0–52.0)
Hemoglobin: 16.4 g/dL (ref 13.0–17.0)
MCH: 29 pg (ref 26.0–34.0)
MCHC: 32.7 g/dL (ref 30.0–36.0)
MCV: 88.7 fL (ref 78.0–100.0)
Platelets: 206 10*3/uL (ref 150–400)
RBC: 5.65 MIL/uL (ref 4.22–5.81)
RDW: 12.9 % (ref 11.5–15.5)
WBC: 8.8 10*3/uL (ref 4.0–10.5)

## 2018-05-23 LAB — PROTIME-INR
INR: 1
Prothrombin Time: 13.1 seconds (ref 11.4–15.2)

## 2018-05-23 LAB — I-STAT TROPONIN, ED: Troponin i, poc: 0.4 ng/mL (ref 0.00–0.08)

## 2018-05-23 LAB — APTT: aPTT: 30 seconds (ref 24–36)

## 2018-05-23 LAB — LIPASE, BLOOD: Lipase: 24 U/L (ref 11–51)

## 2018-05-23 MED ORDER — ONDANSETRON 4 MG PO TBDP: 4.0000 mg | ORAL_TABLET | Freq: Three times a day (TID) | ORAL | 0 refills | Status: DC | PRN

## 2018-05-23 MED ORDER — DIPHENHYDRAMINE HCL 50 MG/ML IJ SOLN
25.0000 mg | Freq: Once | INTRAMUSCULAR | Status: AC
  Administered 2018-05-23: 25 mg via INTRAVENOUS
  Filled 2018-05-23: qty 1

## 2018-05-23 MED ORDER — SODIUM CHLORIDE 0.9 % IV BOLUS
500.0000 mL | Freq: Once | INTRAVENOUS | Status: AC
  Administered 2018-05-23: 500 mL via INTRAVENOUS

## 2018-05-23 MED ORDER — SODIUM CHLORIDE 0.9 % IV BOLUS
1000.0000 mL | Freq: Once | INTRAVENOUS | Status: AC
  Administered 2018-05-23: 1000 mL via INTRAVENOUS

## 2018-05-23 MED ORDER — METOCLOPRAMIDE HCL 5 MG/ML IJ SOLN
10.0000 mg | Freq: Once | INTRAMUSCULAR | Status: AC
  Administered 2018-05-23: 10 mg via INTRAVENOUS
  Filled 2018-05-23: qty 2

## 2018-05-23 MED ORDER — ONDANSETRON HCL 4 MG/2ML IJ SOLN
4.0000 mg | Freq: Once | INTRAMUSCULAR | Status: AC
  Administered 2018-05-23: 4 mg via INTRAVENOUS
  Filled 2018-05-23: qty 2

## 2018-05-23 MED ORDER — SUMATRIPTAN SUCCINATE 25 MG PO TABS
25.0000 mg | ORAL_TABLET | ORAL | Status: DC | PRN
Start: 2018-05-23 — End: 2018-05-23
  Administered 2018-05-23: 25 mg via ORAL
  Filled 2018-05-23 (×2): qty 1

## 2018-05-23 MED ORDER — HYDROMORPHONE HCL 2 MG/ML IJ SOLN
1.0000 mg | Freq: Once | INTRAMUSCULAR | Status: AC
  Administered 2018-05-23: 1 mg via INTRAVENOUS
  Filled 2018-05-23: qty 1

## 2018-05-23 MED ORDER — ONDANSETRON 4 MG PO TBDP
4.0000 mg | ORAL_TABLET | Freq: Once | ORAL | Status: AC
  Administered 2018-05-23: 4 mg via ORAL
  Filled 2018-05-23: qty 1

## 2018-05-23 NOTE — ED Notes (Signed)
Pt sttes he understands instructions and follow up as directed.

## 2018-05-23 NOTE — Discharge Instructions (Addendum)
Return if any problems.  Follow up as scheduled °

## 2018-05-23 NOTE — ED Triage Notes (Signed)
Pt was discharged yesterday after an ablasion, this morning he began throwing up.  Despite medication he continues to have emesis.  He then found blood in his urine.

## 2018-05-23 NOTE — ED Provider Notes (Signed)
[facility] Provider Note   CSN: [id number] Arrival date & time: 05/23/18  0601     History   Chief Complaint Chief Complaint  Patient presents with  . Emesis  . Hematuria  . Headache    HPI Thomas Howell is a 27 y.o. male.  The history is provided by the patient. No language interpreter was used.  Emesis   This is a new problem. The current episode started 12 to 24 hours ago. The problem occurs 5 to 10 times per day. There has been no fever. Associated symptoms include headaches.  Hematuria  This is a new problem. The problem occurs constantly. Associated symptoms include headaches. Nothing aggravates the symptoms. Nothing relieves the symptoms.  Headache   Associated symptoms include vomiting.  Pt had an ablation yesterday for SVT.  Pt sees Dr. Ladona Ridgelaylor.  Pt reports he has vomitted multiple times.  Pt reports he has a history of migraines and this feels like his migraines   Past Medical History:  Diagnosis Date  . Complication of anesthesia   . Frequency-urgency syndrome   . History of kidney stones   . Hx of nausea and vomiting    d/t kidney stone  . Hx of sepsis 08/11/2017   due to kidney stone/ hydronephrosis  . Left ureteral stone   . Migraine   . Pain due to ureteral stent (HCC)   . PONV (postoperative nausea and vomiting)    none recently  . Renal calculi    bilateral per ct 07-26-2016  . Scoliosis of lumbar spine 1994   treated at [facility] until age 27  . Wears glasses     Patient Active Problem List   Diagnosis Date Noted  . SVT (supraventricular tachycardia) (HCC) 05/22/2018  . Attention deficit hyperactivity disorder (ADHD), combined type 04/08/2018  . Chronic migraine 02/14/2017  . Neck pain 02/04/2016  . Hydronephrosis, left 08/19/2015  . Hydronephrosis determined by ultrasound   . Kidney stone on left side 08/18/2015  . Nephrolithiasis 08/18/2015  . Kidney stone 07/06/2015  . Left ureteral stone  07/06/2015  . Analgesic rebound headache 06/05/2014  . Migraine headache without aura 05/30/2014    Past Surgical History:  Procedure Laterality Date  . CYSTOSCOPY W/ RETROGRADES Right 07/06/2015   Procedure: CYSTOSCOPY WITH RETROGRADE PYELOGRAM;  Surgeon: Jerilee FieldMatthew Eskridge, MD;  Location: [facility];  Service: Urology;  Laterality: Right;  . CYSTOSCOPY W/ URETERAL STENT PLACEMENT Left 08/08/2015   Procedure: CYSTOSCOPY WITH STENT REPLACEMENT;  Surgeon: Jerilee FieldMatthew Eskridge, MD;  [facility];  Service: Urology;  Laterality: Left;  . CYSTOSCOPY W/ URETERAL STENT PLACEMENT Left 02/21/2017   Procedure: CYSTOSCOPY WITH RETROGRADE PYELOGRAM/URETERAL LEFT STENT PLACEMENT WITH  LASER;  Surgeon: Bjorn PippinJohn Wrenn, MD;  Location: [facility];  Service: Urology;  Laterality: Left;  . CYSTOSCOPY W/ URETERAL STENT PLACEMENT Left 08/10/2017   Procedure: CYSTOSCOPY WITH RETROGRADE PYELOGRAM/URETERAL STENT PLACEMENT;  Surgeon: Heloise PurpuraBorden, Lester, MD;  Location: [facility];  Service: Urology;  Laterality: Left;  . CYSTOSCOPY WITH RETROGRADE PYELOGRAM, URETEROSCOPY AND STENT PLACEMENT Left 06/24/2014   Procedure: CYSTOSCOPY WITH RETROGRADE PYELOGRAM,  AND STENT PLACEMENT;  Surgeon: Magdalene Mollyaniel Y Woodruff, MD;  Location: [facility];  Service: Urology;  Laterality: Left;  . CYSTOSCOPY WITH RETROGRADE PYELOGRAM, URETEROSCOPY AND STENT PLACEMENT Left 07/05/2014   Procedure: CYSTO/LEFT URETEROSCOPY/LEFT RETROGRADE PYELOGRAM/LEFT STENT PLACEMENT;  Surgeon: Jerilee FieldMatthew Eskridge, MD;  [facility];  Service: Urology;  Laterality: Left;  . CYSTOSCOPY WITH RETROGRADE PYELOGRAM, URETEROSCOPY  AND STENT PLACEMENT Left 07/06/2015   Procedure: CYSTOSCOPY WITH RETROGRADE PYELOGRAM, URETEROSCOPY , LASER, STENT PLACEMENT and BASKET EXTRACTION;  Surgeon: Jerilee Field, MD;  Location: [facility];  Service: Urology;  Laterality: Left;  . CYSTOSCOPY WITH RETROGRADE PYELOGRAM, URETEROSCOPY AND STENT PLACEMENT Left 08/19/2015   Procedure:  CYSTOSCOPY WITH RETROGRADE PYELOGRAM, URETEROSCOPY AND STENT PLACEMENT;  Surgeon: Jerilee Field, MD;  Location: [facility];  Service: Urology;  Laterality: Left;  . CYSTOSCOPY WITH RETROGRADE PYELOGRAM, URETEROSCOPY AND STENT PLACEMENT Left 03/13/2018   Procedure: CYSTOSCOPY WITH RETROGRADE PYELOGRAM, URETEROSCOPY AND LEFT STENT PLACEMENT;  Surgeon: Bjorn Pippin, MD;  Location: [facility];  Service: Urology;  Laterality: Left;  . CYSTOSCOPY WITH URETEROSCOPY AND STENT PLACEMENT Left 01/16/2016   Procedure: CYSTOSCOPY WITH LEFT RETROGRADE PYELOGRAM  LEFT DIGITAL URETEROSCOPY AND PLACEMENT LEFT URETERAL STENT;  Surgeon: Jerilee Field, MD;  Location: [facility];  Service: Urology;  Laterality: Left;  . CYSTOSCOPY WITH URETEROSCOPY, STONE BASKETRY AND STENT PLACEMENT Left 02/13/2016   Procedure: CYSTOSCOPY WITH LEFT URETEROSCOPY, HOLMIUM LASER AND STENT PLACEMENT;  Surgeon: Jerilee Field, MD;  Location: [facility];  Service: Urology;  Laterality: Left;  . CYSTOSCOPY/RETROGRADE/URETEROSCOPY/STONE EXTRACTION WITH BASKET Left 08/08/2015   Procedure: CYSTOSCOPY/RETROGRADE/URETEROSCOPY/STONE EXTRACTION WITH BASKET;  Surgeon: Jerilee Field, MD;  Location: [facility];  Service: Urology;  Laterality: Left;  . CYSTOSCOPY/URETEROSCOPY/HOLMIUM LASER/STENT PLACEMENT Left 07/30/2016   Procedure: CYSTOSCOPY/URETEROSCOPY/HOLMIUM LASER/STENT PLACEMENT;  Surgeon: Jerilee Field, MD;  Location: [facility];  Service: Urology;  Laterality: Left;  . CYSTOSCOPY/URETEROSCOPY/HOLMIUM LASER/STENT PLACEMENT Left 08/19/2017   Procedure: CYSTOSCOPY/URETEROSCOPY/HOLMIUM LASER/STENT PLACEMENT;  Surgeon: Jerilee Field, MD;  Location: [facility];  Service: Urology;  Laterality: Left;  . EXTRACORPOREAL SHOCK WAVE LITHOTRIPSY Left 07-07-2015  &  12-26-2014  . 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 HOLMIUM LASER APPLICATION Left  07/05/2014   Procedure: LASER LITHO;  Surgeon: Jerilee Field, MD;  Location: [facility];  Service: Urology;  Laterality: Left;  . HOLMIUM LASER APPLICATION Left 08/08/2015   Procedure: HOLMIUM LASER  WITH LITHOTRIPSY ;  Surgeon: Jerilee Field, MD;  Location: [facility];  Service: Urology;  Laterality: Left;  . HOLMIUM LASER APPLICATION Left 02/21/2017   Procedure: HOLMIUM LASER APPLICATION;  Surgeon: Bjorn Pippin, MD;  Location: [facility];  Service: Urology;  Laterality: Left;  . HOLMIUM LASER APPLICATION Left 03/13/2018   Procedure: HOLMIUM LASER APPLICATION;  Surgeon: Bjorn Pippin, MD;  Location: [facility];  Service: Urology;  Laterality: Left;  . LYMPH GLAND EXCISION  2003   neck--  benign  . SVT ABLATION N/A 05/22/2018   Procedure: SVT ABLATION;  Surgeon: Marinus Maw, MD;  Location: [facility] INVASIVE CV LAB;  Service: Cardiovascular;  Laterality: N/A;        Home Medications    Prior to Admission medications   Medication Sig Start Date End Date Taking? Authorizing Provider  BOTOX 100 units SOLR injection INJECT IN THE MUSCLE TO HEAD AND NECK MUSCLES EVERY 3 MONTHS BY PROVIDER 06/06/17  Yes Sater, Pearletha Furl, MD  diltiazem (CARDIZEM) 60 MG tablet Take 10-20 mg by mouth as needed (tachycardia).  05/08/18  Yes [provider]  fluticasone (FLONASE) 50 MCG/ACT nasal spray Place 1 spray into both nostrils daily as needed for allergies or rhinitis.   Yes [provider]  hydrochlorothiazide (HYDRODIURIL) 25 MG tablet Take 25 mg by mouth 2 (two) times daily.   Yes [provider]  lamoTRIgine (  LAMICTAL) 100 MG tablet One po bid Patient taking differently: Take 100 mg by mouth 2 (two) times daily.  03/02/18  Yes Sater, Pearletha Furl, MD  levETIRAcetam (KEPPRA) 750 MG tablet TAKE 1 TABLET BY MOUTH EVERY MORNING AND 2 TABLETS EVERY NIGHT AT BEDTIME 03/02/18  Yes Sater, Pearletha Furl, MD  naproxen sodium (ALEVE) 220 MG tablet Take 660 mg by mouth daily as needed  (migraine).   Yes [provider]  Potassium Citrate 15 MEQ (1620 MG) TBCR Take 30 mEq by mouth 2 (two) times daily. 03/28/18  Yes [provider]  rizatriptan (MAXALT-MLT) 10 MG disintegrating tablet DISSOLVE 1 TABLET ON THE TONGUE EVERY DAY AS NEEDED FOR MIGRAINE. MAY REPEAT IN 2 HOURS IF NEEDED. MAX OF 2 TABLETS PER DAY 03/02/18  Yes Sater, Pearletha Furl, MD  amphetamine-dextroamphetamine (ADDERALL) 10 MG tablet Take one po qam then one po 4 hours later Patient not taking: Reported on 05/23/2018 04/07/18   Campbell Riches, NP  docusate sodium (COLACE) 100 MG capsule Take 2 capsules (200 mg total) by mouth 2 (two) times daily. Patient not taking: Reported on 05/16/2018 03/13/18 03/13/19  Budd Palmer, MD  ondansetron (ZOFRAN ODT) 4 MG disintegrating tablet Take 1 tablet (4 mg total) by mouth every 8 (eight) hours as needed for nausea or vomiting. 05/23/18   Elson Areas, PA-C  oxybutynin (DITROPAN) 5 MG tablet Take 1 tablet (5 mg total) by mouth every 8 (eight) hours as needed for bladder spasms. Patient not taking: Reported on 05/16/2018 03/13/18   Budd Palmer, MD  oxycodone (OXY-IR) 5 MG capsule Take 1-2 capsules (5-10 mg total) by mouth every 4 (four) hours as needed. Patient not taking: Reported on 05/16/2018 03/13/18   Budd Palmer, MD  tamsulosin [facility]) 0.4 MG CAPS capsule 1 q HS to aid stone passage Patient not taking: Reported on 05/16/2018 03/12/18   Mancel Bale, MD    Family History Family History  Problem Relation Age of Onset  . Cancer Father   . Kidney Stones Mother   . Cancer Other   . Hypertension Other   . Hyperlipidemia Other   . Stroke Other   . Kidney Stones Brother     Social History Social History   Tobacco Use  . Smoking status: Former Smoker    Years: 2.00    Last attempt to quit: 07/29/2014    Years since quitting: 3.8  . Smokeless tobacco: Never Used  . Tobacco comment: social smoker  Substance Use Topics  . Alcohol  use: Yes    Alcohol/week: 0.0 oz    Comment: occ  . Drug use: No     Allergies   Bactrim [sulfamethoxazole-trimethoprim]; Codeine; and Adhesive [tape]   Review of Systems Review of Systems  Gastrointestinal: Positive for vomiting.  Genitourinary: Positive for hematuria.  Neurological: Positive for headaches.  All other systems reviewed and are negative.    Physical Exam Updated Vital Signs BP 111/71   Pulse 85   Temp 97.7 F (36.5 C) (Oral)   Resp 15   Ht 5\' 9"  (1.753 m)   Wt 106.6 kg (235 lb)   SpO2 98%   BMI 34.70 kg/m   Physical Exam  Constitutional: He appears well-developed and well-nourished.  HENT:  Head: Normocephalic and atraumatic.  Eyes: Pupils are equal, round, and reactive to light. Conjunctivae and EOM are normal.  Neck: Neck supple.  Cardiovascular: Normal rate and regular rhythm.  No murmur heard. Pulmonary/Chest: Effort normal and breath sounds  normal. No respiratory distress.  Abdominal: Soft. There is no tenderness.  Musculoskeletal: He exhibits no edema.  Neurological: He is alert. He has normal strength.  Skin: Skin is warm and dry.  Psychiatric: He has a normal mood and affect.  Nursing note and vitals reviewed.    ED Treatments / Results  Labs (all labs ordered are listed, but only abnormal results are displayed) Labs Reviewed  COMPREHENSIVE METABOLIC PANEL - Abnormal; Notable for the following components:      Result Value   Glucose, Bld 115 (*)    All other components within normal limits  URINALYSIS, ROUTINE W REFLEX MICROSCOPIC - Abnormal; Notable for the following components:   Color, Urine RED (*)    APPearance BLOODY (*)    Glucose, UA   (*)    Value: TEST NOT REPORTED DUE TO COLOR INTERFERENCE OF URINE PIGMENT   Hgb urine dipstick   (*)    Value: TEST NOT REPORTED DUE TO COLOR INTERFERENCE OF URINE PIGMENT   Bilirubin Urine   (*)    Value: TEST NOT REPORTED DUE TO COLOR INTERFERENCE OF URINE PIGMENT   Ketones, ur   (*)     Value: TEST NOT REPORTED DUE TO COLOR INTERFERENCE OF URINE PIGMENT   Protein, ur   (*)    Value: TEST NOT REPORTED DUE TO COLOR INTERFERENCE OF URINE PIGMENT   Nitrite   (*)    Value: TEST NOT REPORTED DUE TO COLOR INTERFERENCE OF URINE PIGMENT   Leukocytes, UA   (*)    Value: TEST NOT REPORTED DUE TO COLOR INTERFERENCE OF URINE PIGMENT   RBC / HPF >50 (*)    Bacteria, UA RARE (*)    All other components within normal limits  I-STAT TROPONIN, ED - Abnormal; Notable for the following components:   Troponin i, poc 0.40 (*)    All other components within normal limits  LIPASE, BLOOD  CBC  APTT  PROTIME-INR  I-STAT CG4 LACTIC ACID, ED  I-STAT CG4 LACTIC ACID, ED    EKG EKG Interpretation  Date/Time:  Tuesday May 23 2018 06:10:56 EDT Ventricular Rate:  95 PR Interval:  116 QRS Duration: 94 QT Interval:  366 QTC Calculation: 459 R Axis:   -26 Text Interpretation:  Normal sinus rhythm Borderline ECG Artifact NO STEMI Otherwise no significant change Confirmed by Drema Pry ([id number]) on 05/23/2018 6:27:53 AM   Radiology No results found.  Procedures Procedures (including critical care time)  Medications Ordered in ED Medications  ondansetron (ZOFRAN) injection 4 mg (4 mg Intravenous Given 05/23/18 0630)  sodium chloride 0.9 % bolus 1,000 mL (0 mLs Intravenous Stopped 05/23/18 0834)  HYDROmorphone (DILAUDID) injection 1 mg (1 mg Intravenous Given 05/23/18 0630)  metoCLOPramide (REGLAN) injection 10 mg (10 mg Intravenous Given 05/23/18 0831)  diphenhydrAMINE (BENADRYL) injection 25 mg (25 mg Intravenous Given 05/23/18 0828)  sodium chloride 0.9 % bolus 500 mL (0 mLs Intravenous Stopped 05/23/18 1034)  ondansetron (ZOFRAN-ODT) disintegrating tablet 4 mg (4 mg Oral Given 05/23/18 1108)     Initial Impression / Assessment and Plan / ED Course  I have reviewed the triage vital signs and the nursing notes.  Pertinent labs & imaging results that were available during my care of the  patient were reviewed by me and considered in my medical decision making (see chart for details).    MDM  Urine shows hematuria.  Pt did receive Iv heparin prior to ablation.   Pt given Iv fliuds, zofran and dilaudid.  Pt reports feeling better, still has a headache.  Pt given reglan and benadryl.   Pt reports he feels better.  Pt urinated and had some decrease in redness.  Pt had a elevated troponin.  He has not had any chest pain, his EKg is normal  I spoke to Dr. Horris Latino who advised normal to have elevation after ablation.  I doubt coronary artery disease.   Final Clinical Impressions(s) / ED Diagnoses   Final diagnoses:  Gross hematuria  Vomiting, intractability of vomiting not specified, presence of nausea not specified, unspecified vomiting type    ED Discharge Orders        Ordered    ondansetron (ZOFRAN ODT) 4 MG disintegrating tablet  Every 8 hours PRN     05/23/18 1024    An After Visit Summary was printed and given to the patient.    Elson Areas, PA-C 05/23/18 1534    Nira Conn, MD 05/24/18 [phone]

## 2018-05-23 NOTE — ED Notes (Signed)
Dressing at neck and groin continue dry and intact wihtput bleeding, swelling reddness or drainage.

## 2018-05-23 NOTE — ED Notes (Signed)
Dressing at reight clavicle and right groin dry and intact with out bleeding.

## 2018-05-23 NOTE — ED Notes (Signed)
Urine color shown to PA.

## 2018-05-24 NOTE — Telephone Encounter (Signed)
I called to check status of the refill request. It is still processing, marked as stat.

## 2018-05-25 NOTE — Telephone Encounter (Signed)
I called [facility] to check status of the patients Botox medication. It is still pending.   I called to make the patient aware that are going to need patient consent. He did not answer so I left a VM asking him to call me back. If he calls back and I am not here please ask him to call [facility] at [phone].

## 2018-05-30 ENCOUNTER — Telehealth: Payer: Self-pay | Admitting: Internal Medicine

## 2018-05-30 NOTE — Telephone Encounter (Signed)
Patient notified that letter is ready to be picked up. Pt voiced that he was able to print from MyChart.

## 2018-05-30 NOTE — Telephone Encounter (Signed)
Forward to Dr.Taylor.

## 2018-05-30 NOTE — Telephone Encounter (Signed)
Pt is wanting to know if he should schedule a f/u since he had his ablation last week on the 3rd.   Also, he's needing to know when he can return to work and will need a note when he can.

## 2018-06-05 NOTE — Telephone Encounter (Signed)
I called to check status of the patients medication. It is ready to ship but pending patient consent. They stated that they have tried to reach the patient several times but have had no success.   I called the patient to make him aware but he did not answer. I left a VM asking him to call me back.

## 2018-06-06 DIAGNOSIS — G43709 Chronic migraine without aura, not intractable, without status migrainosus: Secondary | ICD-10-CM | POA: Diagnosis not present

## 2018-06-06 NOTE — Telephone Encounter (Signed)
I spoke with patient and he gave consent, medication is scheduled for delivery.

## 2018-06-07 ENCOUNTER — Encounter: Payer: Self-pay | Admitting: Neurology

## 2018-06-07 ENCOUNTER — Telehealth: Payer: Self-pay | Admitting: Neurology

## 2018-06-07 ENCOUNTER — Ambulatory Visit: Payer: BLUE CROSS/BLUE SHIELD | Admitting: Neurology

## 2018-06-07 VITALS — BP 126/82 | HR 85 | Ht 67.0 in | Wt 248.5 lb

## 2018-06-07 DIAGNOSIS — IMO0002 Reserved for concepts with insufficient information to code with codable children: Secondary | ICD-10-CM

## 2018-06-07 DIAGNOSIS — G43009 Migraine without aura, not intractable, without status migrainosus: Secondary | ICD-10-CM | POA: Diagnosis not present

## 2018-06-07 DIAGNOSIS — M542 Cervicalgia: Secondary | ICD-10-CM

## 2018-06-07 DIAGNOSIS — G43709 Chronic migraine without aura, not intractable, without status migrainosus: Secondary | ICD-10-CM

## 2018-06-07 MED ORDER — BUTALBITAL-APAP-CAFFEINE 50-325-40 MG PO TABS: 1.0000 | ORAL_TABLET | Freq: Four times a day (QID) | ORAL | 3 refills | Status: DC | PRN

## 2018-06-07 NOTE — Telephone Encounter (Signed)
3 mo btx °

## 2018-06-07 NOTE — Progress Notes (Signed)
[facility]  PATIENT: Thomas Howell DOB: [birth year]  REFERRING DOCTOR OR PCP:  Thomas Howell SOURCE: patient, records in EMR  _________________________________   HISTORICAL  CHIEF COMPLAINT:  Chief Complaint  Patient presents with  . Follow-up    botox injections    HISTORY OF PRESENT ILLNESS:  Thomas Howell  is a 27 yo man with chronic migraines  Update 06/07/2018: He continues to get a good benefit form the Botox.   He is now only haivng about 4 migraines a month and a triptan will usually eliminate the migraine after 30 minutes.  While he was hospitalized for a PSVT, he had Fioricet and felt it worked better than the Maxalt.  When he gets a migraine he will usually have nausea, photophobia and phonophobia.  Moving will worsen the headache.  He gets neck pain associated with the migraines and sometimes independently.  Pain is usually worse on the right.  He has PSVT and is undergoing an ablation soon.      Update 03/02/2018: He continues to receive a benefit form Botox --- the benefit has been less the last month but he has not had botox for 4 1/2 months.  He has less than one a week during the first 3 months of Botox.  They are treated with good benefit using Maxalt.Marland Kitchen    He tolerates the Botox injections well  Besides Botox, he is also on Keppra and lamotrigine and feels that they help some.      From 10/19/2017: Since his last Botox 3 months ago, he reports a couple of headaches a month that are usually easy to eat with Maxalt. Last month he had 4 headaches.   He has had frequent migraine headaches since age 74.  Headaches have persisted despite multiple prophylactic regimens.    He started Botox 02/2017.   For the first 10 weeks, he had less than one migraine a week but has had a few the past 2 weeks.   When present, Tylenol sometimes helps and Maxalt helps if the Tylenol does not.   His neck pain also improved with the Botox.  Chronic Migraine  History:   He was experiencing 20-24 headache days of lasting > 4 hours each day.   He also has milder occipital headache pain every day (30/30 a month).     A typical headache starts in the occiput and then radiates to the eye (usually on the right) with a pounding quality.      He has no preceding aura.   He gets nausea and vomiting.   Moving increases the headache and laying in bed in a dark quiet room helps.    He has associated phonophobia and photophobia. Most headaches are random but some are triggered by strong smells such as perfume.     Maxalt helps some of the migraine headaches.  Often he has to take 2 over the day to get rid of the headache .   He uses about 10/month often in combination with Aleve.   Phenergan helps nausea    If he falls asleep, pain is oftebetter upon awakening.      HA treatment History:  Topiramate had some benefit but cause kidney stones and he needed to stop. Amitriptyline caused grogginess the next day.   Nortriptyline had not helped.    Keppra and lamotriine did not help the headache frequency much.  Aleve and other NSAID's by themself have not helped.      REVIEW OF  SYSTEMS: Constitutional: No fevers, chills, sweats, or change in appetite Eyes: No visual changes, double vision, eye pain Ear, nose and throat: No hearing loss, ear pain, nasal congestion, sore throat Cardiovascular: No chest pain, palpitations Respiratory: No shortness of breath at rest or with exertion.   No wheezes GastrointestinaI: No nausea, vomiting, diarrhea, abdominal pain, fecal incontinence Genitourinary: No dysuria, urinary retention or frequency.  He has frequent kidney stones (sees Dr. Mena Howell at [facility]) Musculoskeletal: No neck pain, back pain Integumentary: No rash, pruritus, skin lesions Neurological: as above Psychiatric: No depression at this time.  No anxiety Endocrine: No palpitations, diaphoresis, change in appetite, change in weigh or increased  thirst Hematologic/Lymphatic: No anemia, purpura, petechiae. Allergic/Immunologic: No itchy/runny eyes, nasal congestion, recent allergic reactions, rashes  ALLERGIES: Allergies  Allergen Reactions  . Bactrim [Sulfamethoxazole-Trimethoprim] Nausea And Vomiting  . Codeine Swelling    Specifically cough syrup w/ codeine cause lip swelling  . Adhesive [Tape] Rash    Paper tape ok    HOME MEDICATIONS:  Current Outpatient Medications:  .  BOTOX 100 units SOLR injection, INJECT IN THE MUSCLE TO HEAD AND NECK MUSCLES EVERY 3 MONTHS BY PROVIDER, Disp: 2 each, Rfl: 2 .  hydrochlorothiazide (HYDRODIURIL) 25 MG tablet, Take 25 mg by mouth 2 (two) times daily., Disp: , Rfl:  .  lamoTRIgine (LAMICTAL) 100 MG tablet, One po bid (Patient taking differently: Take 100 mg by mouth 2 (two) times daily. ), Disp: 180 tablet, Rfl: 3 .  levETIRAcetam (KEPPRA) 750 MG tablet, TAKE 1 TABLET BY MOUTH EVERY MORNING AND 2 TABLETS EVERY NIGHT AT BEDTIME, Disp: 270 tablet, Rfl: 3 .  naproxen sodium (ALEVE) 220 MG tablet, Take 660 mg by mouth daily as needed (migraine)., Disp: , Rfl:  .  ondansetron (ZOFRAN ODT) 4 MG disintegrating tablet, Take 1 tablet (4 mg total) by mouth every 8 (eight) hours as needed for nausea or vomiting., Disp: 20 tablet, Rfl: 0 .  Potassium Citrate 15 MEQ (1620 MG) TBCR, Take 30 mEq by mouth 2 (two) times daily., Disp: , Rfl: 9 .  rizatriptan (MAXALT-MLT) 10 MG disintegrating tablet, DISSOLVE 1 TABLET ON THE TONGUE EVERY DAY AS NEEDED FOR MIGRAINE. MAY REPEAT IN 2 HOURS IF NEEDED. MAX OF 2 TABLETS PER DAY, Disp: 30 tablet, Rfl: 3 .  amphetamine-dextroamphetamine (ADDERALL) 10 MG tablet, Take one po qam then one po 4 hours later (Patient not taking: Reported on 05/23/2018), Disp: 60 tablet, Rfl: 0 .  butalbital-acetaminophen-caffeine (FIORICET, ESGIC) 50-325-40 MG tablet, Take 1 tablet by mouth every 6 (six) hours as needed for headache., Disp: 12 tablet, Rfl: 3  PAST MEDICAL HISTORY: Past  Medical History:  Diagnosis Date  . Complication of anesthesia   . Frequency-urgency syndrome   . History of kidney stones   . Hx of nausea and vomiting    d/t kidney stone  . Hx of sepsis 08/11/2017   due to kidney stone/ hydronephrosis  . Left ureteral stone   . Migraine   . Pain due to ureteral stent (HCC)   . PONV (postoperative nausea and vomiting)    none recently  . Renal calculi    bilateral per ct 07-26-2016  . Scoliosis of lumbar spine 1994   treated at [facility] until age 66  . Wears glasses     PAST SURGICAL HISTORY: Past Surgical History:  Procedure Laterality Date  . CYSTOSCOPY W/ RETROGRADES Right 07/06/2015   Procedure: CYSTOSCOPY WITH RETROGRADE PYELOGRAM;  Surgeon: Jerilee Field, MD;  Location: [facility];  Service: Urology;  Laterality: Right;  . CYSTOSCOPY W/ URETERAL STENT PLACEMENT Left 08/08/2015   Procedure: CYSTOSCOPY WITH STENT REPLACEMENT;  Surgeon: Jerilee FieldMatthew Eskridge, MD;  [facility] [place];  Service: Urology;  Laterality: Left;  . CYSTOSCOPY W/ URETERAL STENT PLACEMENT Left 02/21/2017   Procedure: CYSTOSCOPY WITH RETROGRADE PYELOGRAM/URETERAL LEFT STENT PLACEMENT WITH  LASER;  Surgeon: Bjorn PippinJohn Wrenn, MD;  Location: [facility];  Service: Urology;  Laterality: Left;  . CYSTOSCOPY W/ URETERAL STENT PLACEMENT Left 08/10/2017   Procedure: CYSTOSCOPY WITH RETROGRADE PYELOGRAM/URETERAL STENT PLACEMENT;  Surgeon: Heloise PurpuraBorden, Lester, MD;  Location: [facility];  Service: Urology;  Laterality: Left;  . CYSTOSCOPY WITH RETROGRADE PYELOGRAM, URETEROSCOPY AND STENT PLACEMENT Left 06/24/2014   Procedure: CYSTOSCOPY WITH RETROGRADE PYELOGRAM,  AND STENT PLACEMENT;  Surgeon: Magdalene Mollyaniel Y Woodruff, MD;  Location: [facility];  Service: Urology;  Laterality: Left;  . CYSTOSCOPY WITH RETROGRADE PYELOGRAM, URETEROSCOPY AND STENT PLACEMENT Left 07/05/2014   Procedure: CYSTO/LEFT URETEROSCOPY/LEFT RETROGRADE PYELOGRAM/LEFT STENT PLACEMENT;  Surgeon: Jerilee FieldMatthew Eskridge, MD;  [facility];  Service: Urology;  Laterality: Left;  . CYSTOSCOPY WITH RETROGRADE PYELOGRAM, URETEROSCOPY AND STENT PLACEMENT Left 07/06/2015   Procedure: CYSTOSCOPY WITH RETROGRADE PYELOGRAM, URETEROSCOPY , LASER, STENT PLACEMENT and BASKET EXTRACTION;  Surgeon: Jerilee FieldMatthew Eskridge, MD;  Location: [facility];  Service: Urology;  Laterality: Left;  . CYSTOSCOPY WITH RETROGRADE PYELOGRAM, URETEROSCOPY AND STENT PLACEMENT Left 08/19/2015   Procedure: CYSTOSCOPY WITH RETROGRADE PYELOGRAM, URETEROSCOPY AND STENT PLACEMENT;  Surgeon: Jerilee FieldMatthew Eskridge, MD;  Location: [facility];  Service: Urology;  Laterality: Left;  . CYSTOSCOPY WITH RETROGRADE PYELOGRAM, URETEROSCOPY AND STENT PLACEMENT Left 03/13/2018   Procedure: CYSTOSCOPY WITH RETROGRADE PYELOGRAM, URETEROSCOPY AND LEFT STENT PLACEMENT;  Surgeon: Bjorn PippinWrenn, John, MD;  Location: [facility];  Service: Urology;  Laterality: Left;  . CYSTOSCOPY WITH URETEROSCOPY AND STENT PLACEMENT Left 01/16/2016   Procedure: CYSTOSCOPY WITH LEFT RETROGRADE PYELOGRAM  LEFT DIGITAL URETEROSCOPY AND PLACEMENT LEFT URETERAL STENT;  Surgeon: Jerilee FieldMatthew Eskridge, MD;  Location: [facility];  Service: Urology;  Laterality: Left;  . CYSTOSCOPY WITH URETEROSCOPY, STONE BASKETRY AND STENT PLACEMENT Left 02/13/2016   Procedure: CYSTOSCOPY WITH LEFT URETEROSCOPY, HOLMIUM LASER AND STENT PLACEMENT;  Surgeon: Jerilee FieldMatthew Eskridge, MD;  Location: [facility];  Service: Urology;  Laterality: Left;  . CYSTOSCOPY/RETROGRADE/URETEROSCOPY/STONE EXTRACTION WITH BASKET Left 08/08/2015   Procedure: CYSTOSCOPY/RETROGRADE/URETEROSCOPY/STONE EXTRACTION WITH BASKET;  Surgeon: Jerilee FieldMatthew Eskridge, MD;  [facility] [place];  Service: Urology;  Laterality: Left;  . CYSTOSCOPY/URETEROSCOPY/HOLMIUM LASER/STENT PLACEMENT Left 07/30/2016   Procedure: CYSTOSCOPY/URETEROSCOPY/HOLMIUM LASER/STENT PLACEMENT;  Surgeon: Jerilee FieldMatthew Eskridge, MD;  [facility] [place];  Service: Urology;  Laterality: Left;  .  CYSTOSCOPY/URETEROSCOPY/HOLMIUM LASER/STENT PLACEMENT Left 08/19/2017   Procedure: CYSTOSCOPY/URETEROSCOPY/HOLMIUM LASER/STENT PLACEMENT;  Surgeon: [name], Matthew, MD;  [facility] [place];  Service: Urology;  Laterality: Left;  . EXTRACORPOREAL SHOCK WAVE LITHOTRIPSY Left 07-07-2015  &  12-26-2014  . 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 HOLMIUM LASER APPLICATION Left 07/05/2014   Procedure: LASER LITHO;  Surgeon: Jerilee FieldMatthew Eskridge, MD;  [facility] [place];  Service: Urology;  Laterality: Left;  . HOLMIUM LASER APPLICATION Left 08/08/2015   Procedure: HOLMIUM LASER  WITH LITHOTRIPSY ;  Surgeon: Jerilee FieldMatthew Eskridge, MD;  [facility] [place];  Service: Urology;  Laterality: Left;  . HOLMIUM LASER APPLICATION Left 02/21/2017   Procedure: HOLMIUM LASER APPLICATION;  Surgeon: Bjorn PippinJohn Wrenn, MD;  Location: [facility];  Service: Urology;  Laterality: Left;  .  HOLMIUM LASER APPLICATION Left 03/13/2018   Procedure: HOLMIUM LASER APPLICATION;  Surgeon: Bjorn Pippin, MD;  Location: [facility];  Service: Urology;  Laterality: Left;  . LYMPH GLAND EXCISION  2003   neck--  benign  . SVT ABLATION N/A 05/22/2018   Procedure: SVT ABLATION;  Surgeon: Marinus Maw, MD;  Location: [facility];  Service: Cardiovascular;  Laterality: N/A;    FAMILY HISTORY: Family History  Problem Relation Age of Onset  . Cancer Father   . Kidney Stones Mother   . Cancer Other   . Hypertension Other   . Hyperlipidemia Other   . Stroke Other   . Kidney Stones Brother     SOCIAL HISTORY:  Social History   Socioeconomic History  . Marital status: Single    Spouse name: Not on file  . Number of children: Not on file  . Years of education: Not on file  . Highest education level: Not on file  Occupational History  . Not on file  Social Needs  . Financial resource strain: Not on file  . Food insecurity:     Worry: Not on file    Inability: Not on file  . Transportation needs:    Medical: Not on file    Non-medical: Not on file  Tobacco Use  . Smoking status: Former Smoker    Years: 2.00    Last attempt to quit: 07/29/2014    Years since quitting: 3.8  . Smokeless tobacco: Never Used  . Tobacco comment: social smoker  Substance and Sexual Activity  . Alcohol use: Yes    Alcohol/week: 0.0 oz    Comment: occ  . Drug use: No  . Sexual activity: Never  Lifestyle  . Physical activity:    Days per week: Not on file    Minutes per session: Not on file  . Stress: Not on file  Relationships  . Social connections:    Talks on phone: Not on file    Gets together: Not on file    Attends religious service: Not on file    Active member of club or organization: Not on file    Attends meetings of clubs or organizations: Not on file    Relationship status: Not on file  . Intimate partner violence:    Fear of current or ex partner: Not on file    Emotionally abused: Not on file    Physically abused: Not on file    Forced sexual activity: Not on file  Other Topics Concern  . Not on file  Social History Narrative  . Not on file     PHYSICAL EXAM  Vitals:   06/07/18 0815  BP: 126/82  Pulse: 85  Weight: 248 lb 8 oz (112.7 kg)  Height: 5\' 7"  (1.702 m)    Body mass index is 38.92 kg/m.   General: The patient is well-developed and well-nourished and in no acute distress  Neck:    The neck is slightly tender over the right splenius capitis muscle .  Neurologic Exam  Mental status: The patient is alert and oriented x 3 at the time of the examination. The patient has apparent normal recent and remote memory, with an apparently normal attention span and concentration ability.   Speech is normal.  Cranial nerves: Soft muscles are intact.  No ptosis.  Good facial strength.  Symmetric smile and eyebrow raise     Motor:  Muscle bulk is normal.   Muscle tone and strength are  normal  Gait and station: Station is normal.   Gait is normal.         DIAGNOSTIC DATA (LABS, IMAGING, TESTING) - I reviewed patient records, labs, notes, testing and imaging myself where available.  Lab Results  Component Value Date   WBC 8.8 05/23/2018   HGB 16.4 05/23/2018   HCT 50.1 05/23/2018   MCV 88.7 05/23/2018   PLT 206 05/23/2018      Component Value Date/Time   NA 138 05/23/2018 0612   K 4.1 05/23/2018 0612   CL 103 05/23/2018 0612   CO2 24 05/23/2018 0612   GLUCOSE 115 (H) 05/23/2018 0612   BUN 16 05/23/2018 0612   CREATININE 1.05 05/23/2018 0612   CALCIUM 9.5 05/23/2018 0612   PROT 7.8 05/23/2018 0612   ALBUMIN 4.3 05/23/2018 0612   AST 35 05/23/2018 0612   ALT 45 05/23/2018 0612   ALKPHOS 76 05/23/2018 0612   BILITOT 0.9 05/23/2018 0612   GFRNONAA >60 05/23/2018 0612   GFRAA >60 05/23/2018 0612       ASSESSMENT AND PLAN  Chronic migraine  Migraine without aura and without status migrainosus, not intractable  Neck pain    1.  Botox 200 u:  Frontalis (5 units 4), temporalis (5 units 8), occipitalis (5 units 6), corrugators and procerus (5 units 3), splenius capitis (15 units left and 20 U right), trapezius (10 units 2), C3C4 paraspinal muscles (10 U x 2); C6-C7 paraspinal muscles (10 units 2)     Office supply   [phone] 2.   For migraines can take Maxalt.   I'll prescribe Fioricets as a backup 3.    Return for Botox in 3 months or call sooner if there are new or worsening neurologic symptoms.       Baird Polinski A. Epimenio Foot, MD, PhD 06/07/2018, 8:50 AM Certified in Neurology, Clinical Neurophysiology, Sleep Medicine, Pain Medicine and Neuroimaging  [facility] [address], [address] [place], [place] [address] [phone]

## 2018-06-14 NOTE — Telephone Encounter (Signed)
I called to schedule the patient for his next injection but she did not answer so I left a VM asking him to call me back.

## 2018-06-19 ENCOUNTER — Ambulatory Visit (INDEPENDENT_AMBULATORY_CARE_PROVIDER_SITE_OTHER): Payer: BLUE CROSS/BLUE SHIELD | Admitting: Internal Medicine

## 2018-06-19 ENCOUNTER — Encounter: Payer: Self-pay | Admitting: Internal Medicine

## 2018-06-19 VITALS — BP 104/84 | HR 95 | Ht 69.0 in | Wt 248.0 lb

## 2018-06-19 DIAGNOSIS — R000 Tachycardia, unspecified: Secondary | ICD-10-CM

## 2018-06-19 NOTE — Progress Notes (Signed)
HPI Thomas Howell returns for followup of SVT. The patient is a pleasant 27 yo man with a wide QRS tachycardia. 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 He underwent successful ablation of the slow pathway and after had no inducible SVT. He has had no recurrent SVT. He has an apple watch and notes hr's in the 120 range.  Allergies  Allergen Reactions  . Bactrim [Sulfamethoxazole-Trimethoprim] Nausea And Vomiting  . Codeine Swelling    Specifically cough syrup w/ codeine cause lip swelling  . Adhesive [Tape] Rash    Paper tape ok     Current Outpatient Medications  Medication Sig Dispense Refill  . BOTOX 100 units SOLR injection INJECT IN THE MUSCLE TO HEAD AND NECK MUSCLES EVERY 3 MONTHS BY PROVIDER 2 each 2  . butalbital-acetaminophen-caffeine (FIORICET, ESGIC) 50-325-40 MG tablet Take 1 tablet by mouth every 6 (six) hours as needed for headache. 12 tablet 3  . hydrochlorothiazide (HYDRODIURIL) 25 MG tablet Take 25 mg by mouth 2 (two) times daily.    Marland Kitchen lamoTRIgine (LAMICTAL) 100 MG tablet One po bid (Patient taking differently: Take 100 mg by mouth 2 (two) times daily. ) 180 tablet 3  . levETIRAcetam (KEPPRA) 750 MG tablet TAKE 1 TABLET BY MOUTH EVERY MORNING AND 2 TABLETS EVERY NIGHT AT BEDTIME 270 tablet 3  . naproxen sodium (ALEVE) 220 MG tablet Take 660 mg by mouth daily as needed (migraine).    . ondansetron (ZOFRAN ODT) 4 MG disintegrating tablet Take 1 tablet (4 mg total) by mouth every 8 (eight) hours as needed for nausea or vomiting. 20 tablet 0  . Potassium Citrate 15 MEQ (1620 MG) TBCR Take 30 mEq by mouth 2 (two) times daily.  9  . rizatriptan (MAXALT-MLT) 10 MG disintegrating tablet DISSOLVE 1 TABLET ON THE TONGUE EVERY DAY AS NEEDED FOR MIGRAINE. MAY REPEAT IN 2 HOURS IF NEEDED. MAX OF 2 TABLETS PER DAY 30 tablet 3  . amphetamine-dextroamphetamine (ADDERALL) 10 MG tablet Take one po qam then one po 4 hours later (Patient not taking: Reported on 05/23/2018) 60  tablet 0   No current facility-administered medications for this visit.      Past Medical History:  Diagnosis Date  . Complication of anesthesia   . Frequency-urgency syndrome   . History of kidney stones   . Hx of nausea and vomiting    d/t kidney stone  . Hx of sepsis 08/11/2017   due to kidney stone/ hydronephrosis  . Left ureteral stone   . Migraine   . Pain due to ureteral stent (HCC)   . PONV (postoperative nausea and vomiting)    none recently  . Renal calculi    bilateral per ct 07-26-2016  . Scoliosis of lumbar spine 1994   treated at [facility] until age 89  . Wears glasses     ROS:   All systems reviewed and negative except as noted in the HPI.   Past Surgical History:  Procedure Laterality Date  . CYSTOSCOPY W/ RETROGRADES Right 07/06/2015   Procedure: CYSTOSCOPY WITH RETROGRADE PYELOGRAM;  Surgeon: Jerilee Field, MD;  Location: [facility];  Service: Urology;  Laterality: Right;  . CYSTOSCOPY W/ URETERAL STENT PLACEMENT Left 08/08/2015   Procedure: CYSTOSCOPY WITH STENT REPLACEMENT;  Surgeon: Jerilee Field, MD;  Location: [facility];  Service: Urology;  Laterality: Left;  . CYSTOSCOPY W/ URETERAL STENT PLACEMENT Left 02/21/2017   Procedure: CYSTOSCOPY WITH RETROGRADE PYELOGRAM/URETERAL LEFT STENT PLACEMENT WITH  LASER;  Surgeon: John Wrenn, MD;  Location: [facility];  Service: Urology;  Laterality: Left;  . CYSTOSCOPY W/ URETERAL STENT PLACEMENT Left 08/10/2017   Procedure: CYSTOSCOPY WITH RETROGRADE PYELOGRAM/URETERAL STENT PLACEMENT;  Surgeon: Heloise PurpuraBorden, Lester, MD;  Location: [facility];  Service: Urology;  Laterality: Left;  . CYSTOSCOPY WITH RETROGRADE PYELOGRAM, URETEROSCOPY AND STENT PLACEMENT Left 06/24/2014   Procedure: CYSTOSCOPY WITH RETROGRADE PYELOGRAM,  AND STENT PLACEBjorn PippinMENT;  Surgeon: Magdalene Mollyaniel Y Woodruff, MD;  Location: [facility];  Service: Urology;  Laterality: Left;  . CYSTOSCOPY WITH RETROGRADE PYELOGRAM, URETEROSCOPY AND STENT PLACEMENT Left 07/05/2014    Procedure: CYSTO/LEFT URETEROSCOPY/LEFT RETROGRADE PYELOGRAM/LEFT STENT PLACEMENT;  Surgeon: Jerilee FieldMatthew Eskridge, MD;  [facility] [place];  Service: Urology;  Laterality: Left;  . CYSTOSCOPY WITH RETROGRADE PYELOGRAM, URETEROSCOPY AND STENT PLACEMENT Left 07/06/2015   Procedure: CYSTOSCOPY WITH RETROGRADE PYELOGRAM, URETEROSCOPY , LASER, STENT PLACEMENT and BASKET EXTRACTION;  Surgeon: Jerilee FieldMatthew Eskridge, MD;  Location: [facility];  Service: Urology;  Laterality: Left;  . CYSTOSCOPY WITH RETROGRADE PYELOGRAM, URETEROSCOPY AND STENT PLACEMENT Left 08/19/2015   Procedure: CYSTOSCOPY WITH RETROGRADE PYELOGRAM, URETEROSCOPY AND STENT PLACEMENT;  Surgeon: Jerilee FieldMatthew Eskridge, MD;  Location: [facility];  Service: Urology;  Laterality: Left;  . CYSTOSCOPY WITH RETROGRADE PYELOGRAM, URETEROSCOPY AND STENT PLACEMENT Left 03/13/2018   Procedure: CYSTOSCOPY WITH RETROGRADE PYELOGRAM, URETEROSCOPY AND LEFT STENT PLACEMENT;  Surgeon: Bjorn PippinWrenn, John, MD;  Location: [facility];  Service: Urology;  Laterality: Left;  . CYSTOSCOPY WITH URETEROSCOPY AND STENT PLACEMENT Left 01/16/2016   Procedure: CYSTOSCOPY WITH LEFT RETROGRADE PYELOGRAM  LEFT DIGITAL URETEROSCOPY AND PLACEMENT LEFT URETERAL STENT;  Surgeon: Jerilee FieldMatthew Eskridge, MD;  Location: [facility];  Service: Urology;  Laterality: Left;  . CYSTOSCOPY WITH URETEROSCOPY, STONE BASKETRY AND STENT PLACEMENT Left 02/13/2016   Procedure: CYSTOSCOPY WITH LEFT URETEROSCOPY, HOLMIUM LASER AND STENT PLACEMENT;  Surgeon: Jerilee FieldMatthew Eskridge, MD;  Location: [facility];  Service: Urology;  Laterality: Left;  . CYSTOSCOPY/RETROGRADE/URETEROSCOPY/STONE EXTRACTION WITH BASKET Left 08/08/2015   Procedure: CYSTOSCOPY/RETROGRADE/URETEROSCOPY/STONE EXTRACTION WITH BASKET;  Surgeon: Jerilee FieldMatthew Eskridge, MD;  Location[facility];  Service: Urology;  Laterality: Left;  . CYSTOSCOPY/URETEROSCOPY/HOLMIUM LASER/STENT PLACEMENT Left 07/30/2016   Procedure: CYSTOSCOPY/URETEROSCOPY/HOLMIUM LASER/STENT  PLACEMENT;  Surgeon: Jerilee FieldMatthew Eskridge, MD;  [facility] [place];  Service: Urology;  Laterality: Left;  . CYSTOSCOPY/URETEROSCOPY/HOLMIUM LASER/STENT PLACEMENT Left 08/19/2017   Procedure: CYSTOSCOPY/URETEROSCOPY/HOLMIUM LASER/STENT PLACEMENT;  Surgeon: [name], Matthew, MD;  [facility] [place];  Service: Urology;  Laterality: Left;  . EXTRACORPOREAL SHOCK WAVE LITHOTRIPSY Left 07-07-2015  &  12-26-2014  . 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 HOLMIUM LASER APPLICATION Left 07/05/2014   Procedure: LASER LITHO;  Surgeon: Jerilee FieldMatthew Eskridge, MD;  [facility] [place];  Service: Urology;  Laterality: Left;  . HOLMIUM LASER APPLICATION Left 08/08/2015   Procedure: HOLMIUM LASER  WITH LITHOTRIPSY ;  Surgeon: Jerilee FieldMatthew Eskridge, MD;  [facility] [place];  Service: Urology;  Laterality: Left;  . HOLMIUM LASER APPLICATION Left 02/21/2017   Procedure: HOLMIUM LASER APPLICATION;  Surgeon: Bjorn PippinJohn Wrenn, MD;  Location: [facility];  Service: Urology;  Laterality: Left;  . HOLMIUM LASER APPLICATION Left 03/13/2018   Procedure: HOLMIUM LASER APPLICATION;  Surgeon: Bjorn PippinWrenn, John, MD;  Location: [facility];  Service: Urology;  Laterality: Left;  . LYMPH GLAND EXCISION  2003   neck--  benign  . SVT ABLATION N/A 05/22/2018   Procedure: SVT ABLATION;  Surgeon: Marinus Mawaylor, Georga Stys W, MD;  [facility];  Service: Cardiovascular;  Laterality: N/A;     Family History  Problem Relation Age of Onset  . Cancer Father   . Kidney Stones Mother   . Cancer Other   . Hypertension Other   . Hyperlipidemia Other   . Stroke Other   . Kidney Stones Brother      Social History   Socioeconomic History  . Marital status: Single    Spouse name: Not on file  . Number of children: Not on file  . Years of education: Not on file  . Highest education level: Not on file  Occupational History  . Not  on file  Social Needs  . Financial resource strain: Not on file  . Food insecurity:    Worry: Not on file    Inability: Not on file  . Transportation needs:    Medical: Not on file    Non-medical: Not on file  Tobacco Use  . Smoking status: Former Smoker    Years: 2.00    Last attempt to quit: 07/29/2014    Years since quitting: 3.8  . Smokeless tobacco: Never Used  . Tobacco comment: social smoker  Substance and Sexual Activity  . Alcohol use: Yes    Alcohol/week: 0.0 oz    Comment: occ  . Drug use: No  . Sexual activity: Never  Lifestyle  . Physical activity:    Days per week: Not on file    Minutes per session: Not on file  . Stress: Not on file  Relationships  . Social connections:    Talks on phone: Not on file    Gets together: Not on file    Attends religious service: Not on file    Active member of club or organization: Not on file    Attends meetings of clubs or organizations: Not on file    Relationship status: Not on file  . Intimate partner violence:    Fear of current or ex partner: Not on file    Emotionally abused: Not on file    Physically abused: Not on file    Forced sexual activity: Not on file  Other Topics Concern  . Not on file  Social History Narrative  . Not on file     BP 104/84   Pulse 95   Ht 5\' 9"  (1.753 m)   Wt 248 lb (112.5 kg)   SpO2 97%   BMI 36.62 kg/m   Physical Exam:  Well appearing NAD HEENT: Unremarkable Neck:  No JVD, no thyromegally Lymphatics:  No adenopathy Back:  No CVA tenderness Lungs:  Clear with no wheezes HEART:  Regular rate rhythm, no murmurs, no rubs, no clicks Abd:  soft, positive bowel sounds, no organomegally, no rebound, no guarding Ext:  2 plus pulses, no edema, no cyanosis, no clubbing Skin:  No rashes no nodules Neuro:  CN II through XII intact, motor grossly intact  EKG - nsr   Assess/Plan: 1. SVT - He is doing well, s/p EPS/RFA of AVNRT. He will undergo watchful waiting. He asked about  restarting the adderall. From my perspective it would be ok to start this back although I would expect some sinus tachycardia.

## 2018-06-19 NOTE — Patient Instructions (Signed)
Medication Instructions:  Your physician recommends that you continue on your current medications as directed. Please refer to the Current Medication list given to you today.  Labwork: None ordered.  Testing/Procedures: None ordered.  Follow-Up: Your physician wants you to follow-up in: as needed.   Any Other Special Instructions Will Be Listed Below (If Applicable).  If you need a refill on your cardiac medications before your next appointment, please call your pharmacy.    

## 2018-07-07 ENCOUNTER — Encounter: Payer: BLUE CROSS/BLUE SHIELD | Admitting: Nurse Practitioner

## 2018-07-23 ENCOUNTER — Other Ambulatory Visit: Payer: Self-pay | Admitting: Neurology

## 2018-07-30 ENCOUNTER — Other Ambulatory Visit: Payer: Self-pay

## 2018-07-31 NOTE — Telephone Encounter (Signed)
Sorry pt no showed appropriate f u visit for a controlled substance will ntbs first before refilling

## 2018-07-31 NOTE — Telephone Encounter (Signed)
I called and left a message asked that he r/c. 

## 2018-08-04 NOTE — Telephone Encounter (Signed)
Left message to return call 

## 2018-08-14 ENCOUNTER — Inpatient Hospital Stay (HOSPITAL_COMMUNITY)
Admission: EM | Admit: 2018-08-14 | Discharge: 2018-08-17 | DRG: 872 | Disposition: A | Discharge status: Home / Self Care | Payer: BLUE CROSS/BLUE SHIELD | Attending: Internal Medicine | Admitting: Internal Medicine

## 2018-08-14 ENCOUNTER — Other Ambulatory Visit: Payer: Self-pay

## 2018-08-14 ENCOUNTER — Encounter (HOSPITAL_COMMUNITY): Payer: Self-pay

## 2018-08-14 ENCOUNTER — Emergency Department (HOSPITAL_COMMUNITY): Payer: BLUE CROSS/BLUE SHIELD

## 2018-08-14 DIAGNOSIS — E669 Obesity, unspecified: Secondary | ICD-10-CM | POA: Diagnosis present

## 2018-08-14 DIAGNOSIS — N1330 Unspecified hydronephrosis: Secondary | ICD-10-CM | POA: Diagnosis not present

## 2018-08-14 DIAGNOSIS — Z9109 Other allergy status, other than to drugs and biological substances: Secondary | ICD-10-CM | POA: Diagnosis not present

## 2018-08-14 DIAGNOSIS — Z87891 Personal history of nicotine dependence: Secondary | ICD-10-CM

## 2018-08-14 DIAGNOSIS — Z885 Allergy status to narcotic agent status: Secondary | ICD-10-CM | POA: Diagnosis not present

## 2018-08-14 DIAGNOSIS — R000 Tachycardia, unspecified: Secondary | ICD-10-CM | POA: Diagnosis not present

## 2018-08-14 DIAGNOSIS — I471 Supraventricular tachycardia, unspecified: Secondary | ICD-10-CM | POA: Diagnosis present

## 2018-08-14 DIAGNOSIS — N10 Acute tubulo-interstitial nephritis: Secondary | ICD-10-CM | POA: Diagnosis present

## 2018-08-14 DIAGNOSIS — N12 Tubulo-interstitial nephritis, not specified as acute or chronic: Secondary | ICD-10-CM

## 2018-08-14 DIAGNOSIS — Z882 Allergy status to sulfonamides status: Secondary | ICD-10-CM

## 2018-08-14 DIAGNOSIS — N132 Hydronephrosis with renal and ureteral calculous obstruction: Secondary | ICD-10-CM | POA: Diagnosis not present

## 2018-08-14 DIAGNOSIS — Z79899 Other long term (current) drug therapy: Secondary | ICD-10-CM

## 2018-08-14 DIAGNOSIS — N136 Pyonephrosis: Secondary | ICD-10-CM | POA: Diagnosis not present

## 2018-08-14 DIAGNOSIS — R109 Unspecified abdominal pain: Secondary | ICD-10-CM | POA: Diagnosis not present

## 2018-08-14 DIAGNOSIS — G43709 Chronic migraine without aura, not intractable, without status migrainosus: Secondary | ICD-10-CM | POA: Diagnosis not present

## 2018-08-14 DIAGNOSIS — IMO0002 Reserved for concepts with insufficient information to code with codable children: Secondary | ICD-10-CM | POA: Diagnosis present

## 2018-08-14 DIAGNOSIS — G43909 Migraine, unspecified, not intractable, without status migrainosus: Secondary | ICD-10-CM | POA: Diagnosis present

## 2018-08-14 DIAGNOSIS — Z6838 Body mass index (BMI) 38.0-38.9, adult: Secondary | ICD-10-CM

## 2018-08-14 DIAGNOSIS — F902 Attention-deficit hyperactivity disorder, combined type: Secondary | ICD-10-CM | POA: Diagnosis present

## 2018-08-14 DIAGNOSIS — R1032 Left lower quadrant pain: Secondary | ICD-10-CM | POA: Diagnosis not present

## 2018-08-14 DIAGNOSIS — A419 Sepsis, unspecified organism: Principal | ICD-10-CM | POA: Diagnosis present

## 2018-08-14 DIAGNOSIS — N200 Calculus of kidney: Secondary | ICD-10-CM

## 2018-08-14 DIAGNOSIS — Z87442 Personal history of urinary calculi: Secondary | ICD-10-CM | POA: Diagnosis not present

## 2018-08-14 DIAGNOSIS — K219 Gastro-esophageal reflux disease without esophagitis: Secondary | ICD-10-CM | POA: Diagnosis present

## 2018-08-14 DIAGNOSIS — R509 Fever, unspecified: Secondary | ICD-10-CM | POA: Diagnosis not present

## 2018-08-14 DIAGNOSIS — M4150 Other secondary scoliosis, site unspecified: Secondary | ICD-10-CM | POA: Diagnosis not present

## 2018-08-14 LAB — CBC WITH DIFFERENTIAL/PLATELET
Basophils Absolute: 0 10*3/uL (ref 0.0–0.1)
Basophils Relative: 0 %
Eosinophils Absolute: 0 10*3/uL (ref 0.0–0.7)
Eosinophils Relative: 0 %
HCT: 42.4 % (ref 39.0–52.0)
Hemoglobin: 15 g/dL (ref 13.0–17.0)
Lymphocytes Relative: 13 %
Lymphs Abs: 2.2 10*3/uL (ref 0.7–4.0)
MCH: 30.8 pg (ref 26.0–34.0)
MCHC: 35.4 g/dL (ref 30.0–36.0)
MCV: 87.1 fL (ref 78.0–100.0)
Monocytes Absolute: 1.6 10*3/uL — ABNORMAL HIGH (ref 0.1–1.0)
Monocytes Relative: 10 %
Neutro Abs: 13.1 10*3/uL — ABNORMAL HIGH (ref 1.7–7.7)
Neutrophils Relative %: 77 %
Platelets: 197 10*3/uL (ref 150–400)
RBC: 4.87 MIL/uL (ref 4.22–5.81)
RDW: 12.6 % (ref 11.5–15.5)
WBC: 16.9 10*3/uL — ABNORMAL HIGH (ref 4.0–10.5)

## 2018-08-14 LAB — COMPREHENSIVE METABOLIC PANEL
ALT: 46 U/L — ABNORMAL HIGH (ref 0–44)
AST: 33 U/L (ref 15–41)
Albumin: 4.1 g/dL (ref 3.5–5.0)
Alkaline Phosphatase: 66 U/L (ref 38–126)
Anion gap: 11 (ref 5–15)
BUN: 17 mg/dL (ref 6–20)
CO2: 25 mmol/L (ref 22–32)
Calcium: 9.2 mg/dL (ref 8.9–10.3)
Chloride: 102 mmol/L (ref 98–111)
Creatinine, Ser: 0.96 mg/dL (ref 0.61–1.24)
GFR calc Af Amer: >60 mL/min (ref >60)
GFR calc non Af Amer: >60 mL/min (ref >60)
Glucose, Bld: 106 mg/dL — ABNORMAL HIGH (ref 70–99)
Potassium: 3.4 mmol/L — ABNORMAL LOW (ref 3.5–5.1)
Sodium: 138 mmol/L (ref 135–145)
Total Bilirubin: 0.8 mg/dL (ref 0.3–1.2)
Total Protein: 8 g/dL (ref 6.5–8.1)

## 2018-08-14 LAB — URINALYSIS, ROUTINE W REFLEX MICROSCOPIC
Bacteria, UA: NONE SEEN
Bilirubin Urine: NEGATIVE
Glucose, UA: NEGATIVE mg/dL
Ketones, ur: NEGATIVE mg/dL
Nitrite: NEGATIVE
Protein, ur: 30 mg/dL — AB
Specific Gravity, Urine: 1.019 (ref 1.005–1.030)
WBC, UA: >50 WBC/hpf — ABNORMAL HIGH (ref 0–5)
pH: 6 (ref 5.0–8.0)

## 2018-08-14 LAB — I-STAT CG4 LACTIC ACID, ED: Lactic Acid, Venous: 0.9 mmol/L (ref 0.5–1.9)

## 2018-08-14 MED ORDER — POTASSIUM CHLORIDE CRYS ER 10 MEQ PO TBCR
15.0000 meq | EXTENDED_RELEASE_TABLET | Freq: Two times a day (BID) | ORAL | Status: DC
  Administered 2018-08-15 – 2018-08-16 (×4): 15 meq via ORAL
  Filled 2018-08-14 (×3): qty 2

## 2018-08-14 MED ORDER — SODIUM CHLORIDE 0.9 % IV SOLN
1.0000 g | INTRAVENOUS | Status: DC
  Administered 2018-08-14: 1 g via INTRAVENOUS
  Filled 2018-08-14: qty 10

## 2018-08-14 MED ORDER — BUTALBITAL-APAP-CAFFEINE 50-325-40 MG PO TABS: 1.0000 | ORAL_TABLET | Freq: Four times a day (QID) | ORAL | Status: DC | PRN

## 2018-08-14 MED ORDER — SODIUM CHLORIDE 0.9 % IV BOLUS
1000.0000 mL | Freq: Once | INTRAVENOUS | Status: AC
  Administered 2018-08-14: 1000 mL via INTRAVENOUS

## 2018-08-14 MED ORDER — ONDANSETRON HCL 4 MG/2ML IJ SOLN
4.0000 mg | Freq: Three times a day (TID) | INTRAMUSCULAR | Status: DC | PRN
  Administered 2018-08-15 – 2018-08-16 (×3): 4 mg via INTRAVENOUS
  Filled 2018-08-14 (×3): qty 2

## 2018-08-14 MED ORDER — ENOXAPARIN SODIUM 40 MG/0.4ML ~~LOC~~ SOLN
40.0000 mg | Freq: Every day | SUBCUTANEOUS | Status: DC
  Administered 2018-08-15 – 2018-08-16 (×2): 40 mg via SUBCUTANEOUS
  Filled 2018-08-14 (×2): qty 0.4

## 2018-08-14 MED ORDER — ZOLPIDEM TARTRATE 5 MG PO TABS
5.0000 mg | ORAL_TABLET | Freq: Every evening | ORAL | Status: DC | PRN
  Administered 2018-08-15 – 2018-08-16 (×3): 5 mg via ORAL
  Filled 2018-08-14 (×3): qty 1

## 2018-08-14 MED ORDER — KETOROLAC TROMETHAMINE 30 MG/ML IJ SOLN
30.0000 mg | Freq: Once | INTRAMUSCULAR | Status: AC
  Administered 2018-08-14: 30 mg via INTRAVENOUS
  Filled 2018-08-14: qty 1

## 2018-08-14 MED ORDER — IBUPROFEN 200 MG PO TABS
400.0000 mg | ORAL_TABLET | Freq: Four times a day (QID) | ORAL | Status: DC | PRN
  Administered 2018-08-15: 400 mg via ORAL
  Filled 2018-08-14: qty 2

## 2018-08-14 MED ORDER — HYDROMORPHONE HCL 1 MG/ML IJ SOLN
1.0000 mg | INTRAMUSCULAR | Status: DC | PRN
  Administered 2018-08-15 – 2018-08-16 (×12): 1 mg via INTRAVENOUS
  Filled 2018-08-14 (×12): qty 1

## 2018-08-14 MED ORDER — ACETAMINOPHEN 325 MG PO TABS
650.0000 mg | ORAL_TABLET | Freq: Four times a day (QID) | ORAL | Status: DC | PRN
  Administered 2018-08-15 – 2018-08-16 (×3): 650 mg via ORAL
  Filled 2018-08-14 (×3): qty 2

## 2018-08-14 MED ORDER — VANCOMYCIN HCL IN DEXTROSE 750-5 MG/150ML-% IV SOLN: 750.0000 mg | Freq: Once | INTRAVENOUS | Status: DC

## 2018-08-14 MED ORDER — HYDROMORPHONE HCL 1 MG/ML IJ SOLN
1.0000 mg | Freq: Once | INTRAMUSCULAR | Status: AC
  Administered 2018-08-14: 1 mg via INTRAVENOUS
  Filled 2018-08-14: qty 1

## 2018-08-14 MED ORDER — SODIUM CHLORIDE 0.9 % IV SOLN
INTRAVENOUS | Status: DC
  Administered 2018-08-15 – 2018-08-17 (×5): via INTRAVENOUS

## 2018-08-14 MED ORDER — LAMOTRIGINE 100 MG PO TABS
100.0000 mg | ORAL_TABLET | Freq: Two times a day (BID) | ORAL | Status: DC
  Administered 2018-08-15 – 2018-08-17 (×6): 100 mg via ORAL
  Filled 2018-08-14 (×6): qty 1

## 2018-08-14 MED ORDER — LEVETIRACETAM 500 MG PO TABS
750.0000 mg | ORAL_TABLET | Freq: Every day | ORAL | Status: DC
  Administered 2018-08-15 – 2018-08-17 (×3): 750 mg via ORAL
  Filled 2018-08-14 (×3): qty 1

## 2018-08-14 MED ORDER — LEVETIRACETAM 500 MG PO TABS
1500.0000 mg | ORAL_TABLET | Freq: Every day | ORAL | Status: DC
  Administered 2018-08-15 – 2018-08-16 (×3): 1500 mg via ORAL
  Filled 2018-08-14 (×3): qty 3

## 2018-08-14 MED ORDER — SODIUM CHLORIDE 0.9 % IV SOLN
2.0000 g | INTRAVENOUS | Status: DC
  Administered 2018-08-15 – 2018-08-16 (×2): 2 g via INTRAVENOUS
  Filled 2018-08-14 (×2): qty 2

## 2018-08-14 MED ORDER — SENNOSIDES-DOCUSATE SODIUM 8.6-50 MG PO TABS: 1.0000 | ORAL_TABLET | Freq: Every evening | ORAL | Status: DC | PRN

## 2018-08-14 MED ORDER — SODIUM CHLORIDE 0.9 % IV BOLUS
1000.0000 mL | Freq: Once | INTRAVENOUS | Status: AC
  Administered 2018-08-15: 1000 mL via INTRAVENOUS

## 2018-08-14 MED ORDER — VANCOMYCIN HCL 10 G IV SOLR
2000.0000 mg | Freq: Once | INTRAVENOUS | Status: AC
  Administered 2018-08-15: 2000 mg via INTRAVENOUS
  Filled 2018-08-14: qty 2000

## 2018-08-14 MED ORDER — ONDANSETRON HCL 4 MG/2ML IJ SOLN
4.0000 mg | Freq: Once | INTRAMUSCULAR | Status: AC
  Administered 2018-08-14: 4 mg via INTRAVENOUS
  Filled 2018-08-14: qty 2

## 2018-08-14 MED ORDER — SUMATRIPTAN SUCCINATE 50 MG PO TABS
100.0000 mg | ORAL_TABLET | Freq: Once | ORAL | Status: AC | PRN
  Administered 2018-08-15: 100 mg via ORAL
  Filled 2018-08-14 (×2): qty 2

## 2018-08-14 NOTE — H&P (Signed)
History and Physical    Thomas Howell ZOX:[id number] DOB: 1991-09-10 DOA: 08/14/2018  Referring MD/NP/PA:   PCP: Merlyn Albert, MD   Patient coming from:  The patient is coming from home.  At baseline, pt is independent for most of ADL. SNF  Assistant living facility   [facility].       Chief Complaint: Fever, chills, nausea, vomiting, left flank pain,  HPI: Thomas Howell is a 27 y.o. male with medical history significant of multiple kidney and ureteral stone, hydronephrosis, scoliosis, migraine headaches, ADHD, SVT (ablation), who presents with fever, chills, nausea, vomiting left flank pain.  Patient states that he has history of multiple kidney/ureteral stone which required 18 times of stent placement and removal, currently no any stent in his kidneys. He passed a small stone, ~0.4 cm in size in this early Am, then started having left flank pain, nausea, vomiting x4.  Left flank pain was initially severe 9/10, currently 6 out of 10 severity, sharp, nonradiating.  He has chills and a fever for 102 at home.  Patient does not have chest pain, shortness breath, diarrhea or abdominal pain. Patient is followed by Dr. Mena Goes with [facility].  He called [facility] and they told him to come to the ED for further evaluation.  ED Course: pt was found to have WBC 16.9, positive urinalysis (large amount of leukocyte, WBC>50, no bacteria, clear appearance), lactic acid 0.90, potassium 3.4, creatinine normal, temperature 99.9, tachycardia, tachypnea, oxygen saturation 95% on room air.  CT per renal stone protocol showed bilateral hydronephrosis, but no obstructive kidney stone.  Patient is placed on MedSurg bed for observation.  Review of Systems:   General: has fevers, chills, no body weight gain, has poor appetite, has fatigue HEENT: no blurry vision, hearing changes or sore throat Respiratory: no dyspnea, coughing, wheezing CV: no chest pain, no palpitations GI: has  nausea, vomiting, no abdominal pain, diarrhea, constipation GU: no dysuria, burning on urination, increased urinary frequency, hematuria. Has left flank pain. Ext: no leg edema Neuro: no unilateral weakness, numbness, or tingling, no vision change or hearing loss Skin: no rash, no skin tear. MSK: No muscle spasm, no deformity, no limitation of range of movement in spin Heme: No easy bruising.  Travel history: No recent long distant travel.  Allergy:  Allergies  Allergen Reactions  . Bactrim [Sulfamethoxazole-Trimethoprim] Nausea And Vomiting  . Codeine Swelling    Specifically cough syrup w/ codeine cause lip swelling  . Adhesive [Tape] Rash    Paper tape ok    Past Medical History:  Diagnosis Date  . Complication of anesthesia   . Frequency-urgency syndrome   . History of kidney stones   . Hx of nausea and vomiting    d/t kidney stone  . Hx of sepsis 08/11/2017   due to kidney stone/ hydronephrosis  . Left ureteral stone   . Migraine   . Pain due to ureteral stent (HCC)   . PONV (postoperative nausea and vomiting)    none recently  . Renal calculi    bilateral per ct 07-26-2016  . Scoliosis of lumbar spine 1994   treated at [facility] until age 48  . Wears glasses     Past Surgical History:  Procedure Laterality Date  . CYSTOSCOPY W/ RETROGRADES Right 07/06/2015   Procedure: CYSTOSCOPY WITH RETROGRADE PYELOGRAM;  Surgeon: Jerilee Field, MD;  Location: [facility];  Service: Urology;  Laterality: Right;  . CYSTOSCOPY W/ URETERAL STENT PLACEMENT Left 08/08/2015  Procedure: CYSTOSCOPY WITH STENT REPLACEMENT;  Surgeon: Jerilee FieldMatthew Eskridge, MD;  [facility] [place];  Service: Urology;  Laterality: Left;  . CYSTOSCOPY W/ URETERAL STENT PLACEMENT Left 02/21/2017   Procedure: CYSTOSCOPY WITH RETROGRADE PYELOGRAM/URETERAL LEFT STENT PLACEMENT WITH  LASER;  Surgeon: Bjorn PippinJohn Wrenn, MD;  Location: [facility];  Service: Urology;  Laterality: Left;  . CYSTOSCOPY W/ URETERAL STENT  PLACEMENT Left 08/10/2017   Procedure: CYSTOSCOPY WITH RETROGRADE PYELOGRAM/URETERAL STENT PLACEMENT;  Surgeon: Heloise PurpuraBorden, Lester, MD;  Location: [facility];  Service: Urology;  Laterality: Left;  . CYSTOSCOPY WITH RETROGRADE PYELOGRAM, URETEROSCOPY AND STENT PLACEMENT Left 06/24/2014   Procedure: CYSTOSCOPY WITH RETROGRADE PYELOGRAM,  AND STENT PLACEMENT;  Surgeon: Magdalene Mollyaniel Y Woodruff, MD;  Location: [facility];  Service: Urology;  Laterality: Left;  . CYSTOSCOPY WITH RETROGRADE PYELOGRAM, URETEROSCOPY AND STENT PLACEMENT Left 07/05/2014   Procedure: CYSTO/LEFT URETEROSCOPY/LEFT RETROGRADE PYELOGRAM/LEFT STENT PLACEMENT;  Surgeon: Jerilee FieldMatthew Eskridge, MD;  [facility] [place];  Service: Urology;  Laterality: Left;  . CYSTOSCOPY WITH RETROGRADE PYELOGRAM, URETEROSCOPY AND STENT PLACEMENT Left 07/06/2015   Procedure: CYSTOSCOPY WITH RETROGRADE PYELOGRAM, URETEROSCOPY , LASER, STENT PLACEMENT and BASKET EXTRACTION;  Surgeon: Jerilee FieldMatthew Eskridge, MD;  Location[facility];  Service: Urology;  Laterality: Left;  . CYSTOSCOPY WITH RETROGRADE PYELOGRAM, URETEROSCOPY AND STENT PLACEMENT Left 08/19/2015   Procedure: CYSTOSCOPY WITH RETROGRADE PYELOGRAM, URETEROSCOPY AND STENT PLACEMENT;  Surgeon: Jerilee FieldMatthew Eskridge, MD;  Location[facility];  Service: Urology;  Laterality: Left;  . CYSTOSCOPY WITH RETROGRADE PYELOGRAM, URETEROSCOPY AND STENT PLACEMENT Left 03/13/2018   Procedure: CYSTOSCOPY WITH RETROGRADE PYELOGRAM, URETEROSCOPY AND LEFT STENT PLACEMENT;  Surgeon: Bjorn PippinWrenn, John, MD;  Location: [facility];  Service: Urology;  Laterality: Left;  . CYSTOSCOPY WITH URETEROSCOPY AND STENT PLACEMENT Left 01/16/2016   Procedure: CYSTOSCOPY WITH LEFT RETROGRADE PYELOGRAM  LEFT DIGITAL URETEROSCOPY AND PLACEMENT LEFT URETERAL STENT;  Surgeon: Jerilee FieldMatthew Eskridge, MD;  Location[facility];  Service: Urology;  Laterality: Left;  . CYSTOSCOPY WITH URETEROSCOPY, STONE BASKETRY AND STENT PLACEMENT Left 02/13/2016   Procedure: CYSTOSCOPY WITH LEFT  URETEROSCOPY, HOLMIUM LASER AND STENT PLACEMENT;  Surgeon: Jerilee FieldMatthew Eskridge, MD;  Location: [facility];  Service: Urology;  Laterality: Left;  . CYSTOSCOPY/RETROGRADE/URETEROSCOPY/STONE EXTRACTION WITH BASKET Left 08/08/2015   Procedure: CYSTOSCOPY/RETROGRADE/URETEROSCOPY/STONE EXTRACTION WITH BASKET;  Surgeon: Jerilee FieldMatthew Eskridge, MD;  [facility] [place];  Service: Urology;  Laterality: Left;  . CYSTOSCOPY/URETEROSCOPY/HOLMIUM LASER/STENT PLACEMENT Left 07/30/2016   Procedure: CYSTOSCOPY/URETEROSCOPY/HOLMIUM LASER/STENT PLACEMENT;  Surgeon: Jerilee FieldMatthew Eskridge, MD;  [facility] [place];  Service: Urology;  Laterality: Left;  . CYSTOSCOPY/URETEROSCOPY/HOLMIUM LASER/STENT PLACEMENT Left 08/19/2017   Procedure: CYSTOSCOPY/URETEROSCOPY/HOLMIUM LASER/STENT PLACEMENT;  Surgeon: [name], Matthew, MD;  [facility] [place];  Service: Urology;  Laterality: Left;  . EXTRACORPOREAL SHOCK WAVE LITHOTRIPSY Left 07-07-2015  &  12-26-2014  . 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 HOLMIUM LASER APPLICATION Left 07/05/2014   Procedure: LASER LITHO;  Surgeon: Jerilee FieldMatthew Eskridge, MD;  [facility] [place];  Service: Urology;  Laterality: Left;  . HOLMIUM LASER APPLICATION Left 08/08/2015   Procedure: HOLMIUM LASER  WITH LITHOTRIPSY ;  Surgeon: Jerilee FieldMatthew Eskridge, MD;  [facility] ;  Service: Urology;  Laterality: Left;  . HOLMIUM LASER APPLICATION Left 02/21/2017   Procedure: HOLMIUM LASER APPLICATION;  Surgeon: Bjorn PippinJohn Wrenn, MD;  Location: [facility];  Service: Urology;  Laterality: Left;  . HOLMIUM LASER APPLICATION Left 03/13/2018   Procedure: HOLMIUM LASER APPLICATION;  Surgeon: Bjorn PippinWrenn, John,  MD;  Location: [facility];  Service: Urology;  Laterality: Left;  . LYMPH GLAND EXCISION  2003   neck--  benign  . SVT ABLATION N/A 05/22/2018   Procedure: SVT ABLATION;  Surgeon: Marinus Maw,  MD;  [facility];  Service: Cardiovascular;  Laterality: N/A;    Social History:  reports that he quit smoking about 4 years ago. He quit after 2.00 years of use. He has never used smokeless tobacco. He reports that he drinks alcohol. He reports that he does not use drugs.  Family History:  Family History  Problem Relation Age of Onset  . Cancer Father   . Kidney Stones Mother   . Cancer Other   . Hypertension Other   . Hyperlipidemia Other   . Stroke Other   . Kidney Stones Brother      Prior to Admission medications   Medication Sig Start Date End Date Taking? Authorizing Provider  acetaminophen (TYLENOL) 500 MG tablet Take 500 mg by mouth every 4 (four) hours as needed for fever.   Yes [provider]  BOTOX 100 units SOLR injection INJECT IN THE MUSCLE TO HEAD AND NECK MUSCLES EVERY 3 MONTHS BY PROVIDER Patient taking differently: Inject 100 Units into the muscle every 3 (three) months.  06/06/17  Yes Sater, Pearletha Furl, MD  butalbital-acetaminophen-caffeine (FIORICET, ESGIC) [phone] MG tablet Take 1 tablet by mouth every 6 (six) hours as needed for headache. 06/07/18  Yes Sater, Pearletha Furl, MD  hydrochlorothiazide (HYDRODIURIL) 25 MG tablet Take 25 mg by mouth 2 (two) times daily.   Yes [provider]  lamoTRIgine (LAMICTAL) 100 MG tablet One po bid Patient taking differently: Take 100 mg by mouth 2 (two) times daily.  03/02/18  Yes Sater, Pearletha Furl, MD  levETIRAcetam (KEPPRA) 750 MG tablet TAKE 1 TABLET BY MOUTH EVERY MORNING AND 2 TABLETS EVERY NIGHT AT BEDTIME 03/02/18  Yes Sater, Pearletha Furl, MD  ondansetron (ZOFRAN ODT) 4 MG disintegrating tablet Take 1 tablet (4 mg total) by mouth every 8 (eight) hours as needed for nausea or vomiting. 05/23/18  Yes Elson Areas, PA-C  Potassium Citrate 15 MEQ (1620 MG) TBCR Take 30 mEq by mouth 2 (two) times daily. 03/28/18  Yes [provider]  rizatriptan (MAXALT-MLT) 10 MG disintegrating tablet Take one  tablet at onset of headache. May repeat once in 2 hours. Max of 2 tablets in 24 hours and 4 tablets per week 07/24/18  Yes Sater, Pearletha Furl, MD  amphetamine-dextroamphetamine (ADDERALL) 10 MG tablet Take one po qam then one po 4 hours later Patient not taking: Reported on 08/14/2018 04/07/18   Campbell Riches, NP    Physical Exam: Vitals:   08/14/18 2100 08/14/18 2103 08/14/18 2130 08/14/18 2200  BP: 125/81 125/81 111/77 121/78  Pulse: (!) 101 (!) 105 95 98  Resp: 18 14 15 16   Temp:      TempSrc:      SpO2: 97% 97% 96% 95%  Weight:      Height:       General: Not in acute distress HEENT:       Eyes: PERRL, EOMI, no scleral icterus.       ENT: No discharge from the ears and nose, no pharynx injection, no tonsillar enlargement.        Neck: No JVD, no bruit, no mass felt. Heme: No neck lymph node enlargement. Cardiac: S1/S2, RRR, No murmurs, No gallops or rubs. Respiratory: No rales, wheezing, rhonchi or  rubs. GI: Soft, nondistended, nontender, no rebound pain, no organomegaly, BS present. GU: No hematuria. Positive left CVA tenterness. Ext: No pitting leg edema bilaterally. 2+DP/PT pulse bilaterally. Musculoskeletal: No joint deformities, No joint redness or warmth, no limitation of ROM in spin. Skin: No rashes.  Neuro: Alert, oriented X3, cranial nerves II-XII grossly intact, moves all extremities normally. Psych: Patient is not psychotic, no suicidal or hemocidal ideation.  Labs on Admission: I have personally reviewed following labs and imaging studies  CBC: Recent Labs  Lab 08/14/18 2000  WBC 16.9*  NEUTROABS 13.1*  HGB 15.0  HCT 42.4  MCV 87.1  PLT 197   Basic Metabolic Panel: Recent Labs  Lab 08/14/18 2000  NA 138  K 3.4*  CL 102  CO2 25  GLUCOSE 106*  BUN 17  CREATININE 0.96  CALCIUM 9.2   GFR: Estimated Creatinine Clearance: 138.7 mL/min (by C-G formula based on SCr of 0.96 mg/dL). Liver Function Tests: Recent Labs  Lab 08/14/18 2000  AST 33    ALT 46*  ALKPHOS 66  BILITOT 0.8  PROT 8.0  ALBUMIN 4.1   No results for input(s): LIPASE, AMYLASE in the last 168 hours. No results for input(s): AMMONIA in the last 168 hours. Coagulation Profile: No results for input(s): INR, PROTIME in the last 168 hours. Cardiac Enzymes: No results for input(s): CKTOTAL, CKMB, CKMBINDEX, TROPONINI in the last 168 hours. BNP (last 3 results) No results for input(s): PROBNP in the last 8760 hours. HbA1C: No results for input(s): HGBA1C in the last 72 hours. CBG: No results for input(s): GLUCAP in the last 168 hours. Lipid Profile: No results for input(s): CHOL, HDL, LDLCALC, TRIG, CHOLHDL, LDLDIRECT in the last 72 hours. Thyroid Function Tests: No results for input(s): TSH, T4TOTAL, FREET4, T3FREE, THYROIDAB in the last 72 hours. Anemia Panel: No results for input(s): VITAMINB12, FOLATE, FERRITIN, TIBC, IRON, RETICCTPCT in the last 72 hours. Urine analysis:    Component Value Date/Time   COLORURINE YELLOW 08/14/2018 1729   APPEARANCEUR CLEAR 08/14/2018 1729   LABSPEC 1.019 08/14/2018 1729   PHURINE 6.0 08/14/2018 1729   GLUCOSEU NEGATIVE 08/14/2018 1729   HGBUR SMALL (A) 08/14/2018 1729   BILIRUBINUR NEGATIVE 08/14/2018 1729   KETONESUR NEGATIVE 08/14/2018 1729   PROTEINUR 30 (A) 08/14/2018 1729   UROBILINOGEN 0.2 08/18/2015 1900   NITRITE NEGATIVE 08/14/2018 1729   LEUKOCYTESUR LARGE (A) 08/14/2018 1729   Sepsis Labs: @LABRCNTIP (procalcitonin:4,lacticidven:4) )No results found for this or any previous visit (from the past 240 hour(s)).   Radiological Exams on Admission: Ct Renal Stone Study  Result Date: 08/14/2018 CLINICAL DATA:  LEFT flank pain.  Recent passage of a kidney stone. EXAM: CT ABDOMEN AND PELVIS WITHOUT CONTRAST TECHNIQUE: Multidetector CT imaging of the abdomen and pelvis was performed following the standard protocol without IV contrast. COMPARISON:  CT urogram 03/12/2018. FINDINGS: Lower chest: No acute  abnormality. Hepatobiliary: Low attenuation of the liver consistent with steatosis. No focal abnormality. No gallstones, gallbladder wall thickening, or biliary ductal dilatation. Pancreas: Unremarkable. No pancreatic ductal dilatation or surrounding inflammatory changes. Spleen: Normal in size without focal abnormality. Adrenals/Urinary Tract: Normal adrenal glands. BILATERAL nephrolithiasis; the largest stones are dependent in the LEFT collecting system, up to 5 mm in length. LEFT hydronephrosis and hydroureter without obstructing calculus. Normal bladder. Stomach/Bowel: Stomach is within normal limits. Appendix appears normal. No evidence of bowel wall thickening, distention, or inflammatory changes. Vascular/Lymphatic: No significant vascular findings are present. No enlarged abdominal or pelvic lymph nodes. Reproductive: Prostate is  unremarkable. Other: Tiny umbilical hernia.  No abdominopelvic ascites. Musculoskeletal: Transitional lumbosacral anatomy. Degenerative scoliosis convex LEFT mid lumbar region. IMPRESSION: BILATERAL nephrolithiasis. LEFT hydronephrosis and hydroureter without obstructing calculus, likely reflecting recent passage of a stone. Hepatic steatosis. Electronically Signed   By: Elsie Stain M.D.   On: 08/14/2018 20:58     EKG:  Not done in ED, will get one.   Assessment/Plan Principal Problem:   Acute pyelonephritis Active Problems:   Nephrolithiasis   Chronic migraine   Attention deficit hyperactivity disorder (ADHD), combined type   SVT (supraventricular tachycardia) (HCC)   Bilateral hydronephrosis   Sepsis (HCC)   Sepsis due to acute pyelonephritis: Patient meets criteria for sepsis with leukocytosis, tachycardia, tachypnea.  Lactic acid is normal.  Currently hemodynamically stable. Pt was started with rocephin in ED. Since pt has complicated pyelonephritis, I will give 1 dose of vancomycin and follow-up blood culture and urine culture.  -  Place on med-surg bed  for obs -  Ceftriaxone by IV daily -  One dose of Vancomycin by IV - Follow up results of urine and blood cx and amend antibiotic regimen if needed per sensitivity results - prn Zofran for nausea and dilaudid and ibuprofen for pain - will get Procalcitonin and trend lactic acid levels per sepsis protocol. - IVF: 3L of NS bolus in ED, followed by 125 cc/h - f/u Bx and Ux  Nephrolithiasis and  Bilateral hydronephrosis: There is no obstructive stone on CT scan.  Patient is HCTZ at home -Hold HCTZ due to sepsis -Pain control as above  Chronic migraine: pt is getting Botox and injection, last dose was in June -continue home floricet, Imitrex prn  Attention deficit hyperactivity disorder (ADHD), combined type: -on Keppra and Lamictal  Hx of SVT (supraventricular tachycardia) (HCC): resolved after ablation -no acute issues    DVT ppx: SQ Lovenox Code Status: Full code Family Communication: None at bed side.   Disposition Plan:  Anticipate discharge back to previous home environment Consults called:  none Admission status:   medical floor/obs     Date of Service 08/14/2018    Lorretta Harp [facility] Pager [phone]  If 7PM-7AM, please contact night-coverage [email or web address] Password [facility] 08/14/2018, 11:20 PM

## 2018-08-14 NOTE — ED Provider Notes (Signed)
[facility]-EMERGENCY DEPT Provider Note   CSN: [id number] Arrival date & time: 08/14/18  1705     History   Chief Complaint Chief Complaint  Patient presents with  . Flank Pain  . Fever    HPI Thomas Howell is a 27 y.o. male.  HPI   Thomas Howell is a 27 y.o. male, with a history of multiple kidney stones, presenting to the ED with left lower back and left flank pain beginning around 1 am this morning. Passed a kidney stone "about the size of a tick tack" around 5 am. Continued to have severe pain, have fevers up to 102F, and onset of vomiting.  States, "I have had many kidney stones.  Usually once they pass, I no longer have symptoms.  The last time I had these types of symptoms in the setting of a kidney stone, I was septic and had to be admitted."  No recent antibiotic use.  Patient's pain is aching, 9/10, usually nonradiating, but every once in a while will radiate into the left lower quadrant.  Patient is followed by Dr. Mena Goes with [facility].  He called [facility] and they told him to come to the ED for further evaluation.  Last dose of Tylenol was 500 mg around 3 PM today.  Denies diarrhea, shortness of breath, cough, hematuria, dysuria, difficulty urinating, or any other complaints.   Past Medical History:  Diagnosis Date  . Complication of anesthesia   . Frequency-urgency syndrome   . History of kidney stones   . Hx of nausea and vomiting    d/t kidney stone  . Hx of sepsis 08/11/2017   due to kidney stone/ hydronephrosis  . Left ureteral stone   . Migraine   . Pain due to ureteral stent (HCC)   . PONV (postoperative nausea and vomiting)    none recently  . Renal calculi    bilateral per ct 07-26-2016  . Scoliosis of lumbar spine 1994   treated at [facility] until age 59  . Wears glasses     Patient Active Problem List   Diagnosis Date Noted  . SVT (supraventricular tachycardia) (HCC) 05/22/2018  . Attention  deficit hyperactivity disorder (ADHD), combined type 04/08/2018  . Chronic migraine 02/14/2017  . Neck pain 02/04/2016  . Hydronephrosis, left 08/19/2015  . Hydronephrosis determined by ultrasound   . Kidney stone on left side 08/18/2015  . Nephrolithiasis 08/18/2015  . Kidney stone 07/06/2015  . Left ureteral stone 07/06/2015  . Analgesic rebound headache 06/05/2014  . Migraine headache without aura 05/30/2014    Past Surgical History:  Procedure Laterality Date  . CYSTOSCOPY W/ RETROGRADES Right 07/06/2015   Procedure: CYSTOSCOPY WITH RETROGRADE PYELOGRAM;  Surgeon: Jerilee Field, MD;  Location: [facility];  Service: Urology;  Laterality: Right;  . CYSTOSCOPY W/ URETERAL STENT PLACEMENT Left 08/08/2015   Procedure: CYSTOSCOPY WITH STENT REPLACEMENT;  Surgeon: Jerilee Field, MD;  Location: [facility];  Service: Urology;  Laterality: Left;  . CYSTOSCOPY W/ URETERAL STENT PLACEMENT Left 02/21/2017   Procedure: CYSTOSCOPY WITH RETROGRADE PYELOGRAM/URETERAL LEFT STENT PLACEMENT WITH  LASER;  Surgeon: Bjorn Pippin, MD;  Location: [facility];  Service: Urology;  Laterality: Left;  . CYSTOSCOPY W/ URETERAL STENT PLACEMENT Left 08/10/2017   Procedure: CYSTOSCOPY WITH RETROGRADE PYELOGRAM/URETERAL STENT PLACEMENT;  Surgeon: Heloise Purpura, MD;  Location: [facility];  Service: Urology;  Laterality: Left;  . CYSTOSCOPY WITH RETROGRADE PYELOGRAM, URETEROSCOPY AND STENT PLACEMENT Left 06/24/2014   Procedure: CYSTOSCOPY  WITH RETROGRADE PYELOGRAM,  AND STENT PLACEMENT;  Surgeon: Magdalene Mollyaniel Y Woodruff, MD;  Location: [facility];  Service: Urology;  Laterality: Left;  . CYSTOSCOPY WITH RETROGRADE PYELOGRAM, URETEROSCOPY AND STENT PLACEMENT Left 07/05/2014   Procedure: CYSTO/LEFT URETEROSCOPY/LEFT RETROGRADE PYELOGRAM/LEFT STENT PLACEMENT;  Surgeon: Jerilee FieldMatthew Eskridge, MD;  [facility] [place];  Service: Urology;  Laterality: Left;  . CYSTOSCOPY WITH RETROGRADE PYELOGRAM, URETEROSCOPY AND STENT  PLACEMENT Left 07/06/2015   Procedure: CYSTOSCOPY WITH RETROGRADE PYELOGRAM, URETEROSCOPY , LASER, STENT PLACEMENT and BASKET EXTRACTION;  Surgeon: Jerilee FieldMatthew Eskridge, MD;  Location: [facility];  Service: Urology;  Laterality: Left;  . CYSTOSCOPY WITH RETROGRADE PYELOGRAM, URETEROSCOPY AND STENT PLACEMENT Left 08/19/2015   Procedure: CYSTOSCOPY WITH RETROGRADE PYELOGRAM, URETEROSCOPY AND STENT PLACEMENT;  Surgeon: Jerilee FieldMatthew Eskridge, MD;  Location: [facility];  Service: Urology;  Laterality: Left;  . CYSTOSCOPY WITH RETROGRADE PYELOGRAM, URETEROSCOPY AND STENT PLACEMENT Left 03/13/2018   Procedure: CYSTOSCOPY WITH RETROGRADE PYELOGRAM, URETEROSCOPY AND LEFT STENT PLACEMENT;  Surgeon: Bjorn PippinWrenn, John, MD;  Location: [facility];  Service: Urology;  Laterality: Left;  . CYSTOSCOPY WITH URETEROSCOPY AND STENT PLACEMENT Left 01/16/2016   Procedure: CYSTOSCOPY WITH LEFT RETROGRADE PYELOGRAM  LEFT DIGITAL URETEROSCOPY AND PLACEMENT LEFT URETERAL STENT;  Surgeon: Jerilee FieldMatthew Eskridge, MD;  Location: [facility];  Service: Urology;  Laterality: Left;  . CYSTOSCOPY WITH URETEROSCOPY, STONE BASKETRY AND STENT PLACEMENT Left 02/13/2016   Procedure: CYSTOSCOPY WITH LEFT URETEROSCOPY, HOLMIUM LASER AND STENT PLACEMENT;  Surgeon: Jerilee FieldMatthew Eskridge, MD;  Location: [facility];  Service: Urology;  Laterality: Left;  . CYSTOSCOPY/RETROGRADE/URETEROSCOPY/STONE EXTRACTION WITH BASKET Left 08/08/2015   Procedure: CYSTOSCOPY/RETROGRADE/URETEROSCOPY/STONE EXTRACTION WITH BASKET;  Surgeon: Jerilee FieldMatthew Eskridge, MD;  [facility] [place];  Service: Urology;  Laterality: Left;  . CYSTOSCOPY/URETEROSCOPY/HOLMIUM LASER/STENT PLACEMENT Left 07/30/2016   Procedure: CYSTOSCOPY/URETEROSCOPY/HOLMIUM LASER/STENT PLACEMENT;  Surgeon: Jerilee FieldMatthew Eskridge, MD;  [facility] [place];  Service: Urology;  Laterality: Left;  . CYSTOSCOPY/URETEROSCOPY/HOLMIUM LASER/STENT PLACEMENT Left 08/19/2017   Procedure: CYSTOSCOPY/URETEROSCOPY/HOLMIUM LASER/STENT  PLACEMENT;  Surgeon: [name], Matthew, MD;  [facility] [place];  Service: Urology;  Laterality: Left;  . EXTRACORPOREAL SHOCK WAVE LITHOTRIPSY Left 07-07-2015  &  12-26-2014  . 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 HOLMIUM LASER APPLICATION Left 07/05/2014   Procedure: LASER LITHO;  Surgeon: Jerilee FieldMatthew Eskridge, MD;  [facility] [place];  Service: Urology;  Laterality: Left;  . HOLMIUM LASER APPLICATION Left 08/08/2015   Procedure: HOLMIUM LASER  WITH LITHOTRIPSY ;  Surgeon: Jerilee FieldMatthew Eskridge, MD;  [facility] [place];  Service: Urology;  Laterality: Left;  . HOLMIUM LASER APPLICATION Left 02/21/2017   Procedure: HOLMIUM LASER APPLICATION;  Surgeon: Bjorn PippinJohn Wrenn, MD;  Location: [facility];  Service: Urology;  Laterality: Left;  . HOLMIUM LASER APPLICATION Left 03/13/2018   Procedure: HOLMIUM LASER APPLICATION;  Surgeon: Bjorn PippinWrenn, John, MD;  Location: [facility];  Service: Urology;  Laterality: Left;  . LYMPH GLAND EXCISION  2003   neck--  benign  . SVT ABLATION N/A 05/22/2018   Procedure: SVT ABLATION;  Surgeon: Marinus Mawaylor, Gregg W, MD;  [facility];  Service: Cardiovascular;  Laterality: N/A;        Home Medications    Prior to Admission medications   Medication Sig Start Date End Date Taking? Authorizing Provider  acetaminophen (TYLENOL) 500 MG tablet Take 500 mg by mouth every 4 (four) hours as needed for fever.   Yes [provider]  BOTOX 100 units  SOLR injection INJECT IN THE MUSCLE TO HEAD AND NECK MUSCLES EVERY 3 MONTHS BY PROVIDER Patient taking differently: Inject 100 Units into the muscle every 3 (three) months.  06/06/17  Yes Sater, Pearletha Furl, MD  butalbital-acetaminophen-caffeine (FIORICET, ESGIC) [phone] MG tablet Take 1 tablet by mouth every 6 (six) hours as needed for headache. 06/07/18  Yes Sater, Pearletha Furl, MD  hydrochlorothiazide (HYDRODIURIL) 25 MG  tablet Take 25 mg by mouth 2 (two) times daily.   Yes [provider]  lamoTRIgine (LAMICTAL) 100 MG tablet One po bid Patient taking differently: Take 100 mg by mouth 2 (two) times daily.  03/02/18  Yes Sater, Pearletha Furl, MD  levETIRAcetam (KEPPRA) 750 MG tablet TAKE 1 TABLET BY MOUTH EVERY MORNING AND 2 TABLETS EVERY NIGHT AT BEDTIME 03/02/18  Yes Sater, Pearletha Furl, MD  ondansetron (ZOFRAN ODT) 4 MG disintegrating tablet Take 1 tablet (4 mg total) by mouth every 8 (eight) hours as needed for nausea or vomiting. 05/23/18  Yes Elson Areas, PA-C  Potassium Citrate 15 MEQ (1620 MG) TBCR Take 30 mEq by mouth 2 (two) times daily. 03/28/18  Yes [provider]  rizatriptan (MAXALT-MLT) 10 MG disintegrating tablet Take one tablet at onset of headache. May repeat once in 2 hours. Max of 2 tablets in 24 hours and 4 tablets per week 07/24/18  Yes Sater, Pearletha Furl, MD  amphetamine-dextroamphetamine (ADDERALL) 10 MG tablet Take one po qam then one po 4 hours later Patient not taking: Reported on 08/14/2018 04/07/18   Campbell Riches, NP    Family History Family History  Problem Relation Age of Onset  . Cancer Father   . Kidney Stones Mother   . Cancer Other   . Hypertension Other   . Hyperlipidemia Other   . Stroke Other   . Kidney Stones Brother     Social History Social History   Tobacco Use  . Smoking status: Former Smoker    Years: 2.00    Last attempt to quit: 07/29/2014    Years since quitting: 4.0  . Smokeless tobacco: Never Used  . Tobacco comment: social smoker  Substance Use Topics  . Alcohol use: Yes    Alcohol/week: 0.0 standard drinks    Comment: occ  . Drug use: No     Allergies   Bactrim [sulfamethoxazole-trimethoprim]; Codeine; and Adhesive [tape]   Review of Systems Review of Systems  Constitutional: Positive for fever.  Respiratory: Negative for shortness of breath.   Cardiovascular: Negative for chest pain.  Gastrointestinal: Positive for nausea  and vomiting. Negative for diarrhea.  Genitourinary: Positive for flank pain. Negative for difficulty urinating, dysuria, hematuria, penile pain, scrotal swelling and testicular pain.  Musculoskeletal: Positive for back pain.  Neurological: Negative for weakness and numbness.  All other systems reviewed and are negative.    Physical Exam Updated Vital Signs BP 102/83 (BP Location: Right Arm)   Pulse (!) 116   Temp 98.9 F (37.2 C) (Oral)   Resp (!) 22   Ht 5\' 9"  (1.753 m)   Wt 104.3 kg   SpO2 97%   BMI 33.97 kg/m   Physical Exam  Constitutional: He appears well-developed and well-nourished. No distress.  HENT:  Head: Normocephalic and atraumatic.  Eyes: Conjunctivae are normal.  Neck: Neck supple.  Cardiovascular: Regular rhythm, normal heart sounds and intact distal pulses. Tachycardia present.  Pulmonary/Chest: Effort normal and breath sounds normal. No respiratory distress.  Abdominal: Soft. There is tenderness. There is CVA tenderness (left). There  is no guarding.    Tenderness along the left flank.  Musculoskeletal: He exhibits no edema.  Lymphadenopathy:    He has no cervical adenopathy.  Neurological: He is alert.  Skin: Skin is warm and dry. He is not diaphoretic.  Psychiatric: He has a normal mood and affect. His behavior is normal.  Nursing note and vitals reviewed.    ED Treatments / Results  Labs (all labs ordered are listed, but only abnormal results are displayed) Labs Reviewed  URINALYSIS, ROUTINE W REFLEX MICROSCOPIC - Abnormal; Notable for the following components:      Result Value   Hgb urine dipstick SMALL (*)    Protein, ur 30 (*)    Leukocytes, UA LARGE (*)    WBC, UA >50 (*)    All other components within normal limits  COMPREHENSIVE METABOLIC PANEL - Abnormal; Notable for the following components:   Potassium 3.4 (*)    Glucose, Bld 106 (*)    ALT 46 (*)    All other components within normal limits  CBC WITH DIFFERENTIAL/PLATELET -  Abnormal; Notable for the following components:   WBC 16.9 (*)    Neutro Abs 13.1 (*)    Monocytes Absolute 1.6 (*)    All other components within normal limits  CULTURE, BLOOD (ROUTINE X 2)  CULTURE, BLOOD (ROUTINE X 2)  URINE CULTURE  I-STAT CG4 LACTIC ACID, ED    EKG None  Radiology Ct Renal Stone Study  Result Date: 08/14/2018 CLINICAL DATA:  LEFT flank pain.  Recent passage of a kidney stone. EXAM: CT ABDOMEN AND PELVIS WITHOUT CONTRAST TECHNIQUE: Multidetector CT imaging of the abdomen and pelvis was performed following the standard protocol without IV contrast. COMPARISON:  CT urogram 03/12/2018. FINDINGS: Lower chest: No acute abnormality. Hepatobiliary: Low attenuation of the liver consistent with steatosis. No focal abnormality. No gallstones, gallbladder wall thickening, or biliary ductal dilatation. Pancreas: Unremarkable. No pancreatic ductal dilatation or surrounding inflammatory changes. Spleen: Normal in size without focal abnormality. Adrenals/Urinary Tract: Normal adrenal glands. BILATERAL nephrolithiasis; the largest stones are dependent in the LEFT collecting system, up to 5 mm in length. LEFT hydronephrosis and hydroureter without obstructing calculus. Normal bladder. Stomach/Bowel: Stomach is within normal limits. Appendix appears normal. No evidence of bowel wall thickening, distention, or inflammatory changes. Vascular/Lymphatic: No significant vascular findings are present. No enlarged abdominal or pelvic lymph nodes. Reproductive: Prostate is unremarkable. Other: Tiny umbilical hernia.  No abdominopelvic ascites. Musculoskeletal: Transitional lumbosacral anatomy. Degenerative scoliosis convex LEFT mid lumbar region. IMPRESSION: BILATERAL nephrolithiasis. LEFT hydronephrosis and hydroureter without obstructing calculus, likely reflecting recent passage of a stone. Hepatic steatosis. Electronically Signed   By: Elsie Stain M.D.   On: 08/14/2018 20:58     Procedures Procedures (including critical care time)  Medications Ordered in ED Medications  cefTRIAXone (ROCEPHIN) 1 g in sodium chloride 0.9 % 100 mL IVPB (0 g Intravenous Stopped 08/14/18 2048)  sodium chloride 0.9 % bolus 1,000 mL (0 mLs Intravenous Stopped 08/14/18 2218)    Followed by  sodium chloride 0.9 % bolus 1,000 mL (0 mLs Intravenous Stopped 08/14/18 2218)  ketorolac (TORADOL) 30 MG/ML injection 30 mg (30 mg Intravenous Given 08/14/18 2008)  ondansetron (ZOFRAN) injection 4 mg (4 mg Intravenous Given 08/14/18 2008)  HYDROmorphone (DILAUDID) injection 1 mg (1 mg Intravenous Given 08/14/18 2059)  HYDROmorphone (DILAUDID) injection 1 mg (1 mg Intravenous Given 08/14/18 2216)     Initial Impression / Assessment and Plan / ED Course  I have reviewed the triage  vital signs and the nursing notes.  Pertinent labs & imaging results that were available during my care of the patient were reviewed by me and considered in my medical decision making (see chart for details).  Clinical Course as of Aug 14 2246  Mon Aug 14, 2018  2200 Discussed imaging and lab results.  Patient states his pain had improved, but is now starting to rise again, currently rates it 7/10.   [SJ]  2244 Spoke with Dr. Clyde Lundborg, hospitalist.  Agrees to admit the patient.   [SJ]    Clinical Course User Index [SJ] Sasan Wilkie C, PA-C    Patient presents with left flank pain, fever, along with nausea and vomiting.  In addition to the fever, patient tachycardic and with leukocytosis.  He is nontoxic-appearing, however, pain control was a challenge.  Left-sided hydronephrosis on CT without noted obstructing stone.  Suspicion for pyelonephritis, supported by UA findings. Patient to be admitted for pain control and continued management.  Findings and plan of care discussed with Tilden Fossa, MD. Dr. Madilyn Hook personally evaluated and examined this patient.  Vitals:   08/14/18 2100 08/14/18 2103 08/14/18 2130 08/14/18 2200   BP: 125/81 125/81 111/77 121/78  Pulse: (!) 101 (!) 105 95 98  Resp: 18 14 15 16   Temp:      TempSrc:      SpO2: 97% 97% 96% 95%  Weight:      Height:         Final Clinical Impressions(s) / ED Diagnoses   Final diagnoses:  Left flank pain  Pyelonephritis    ED Discharge Orders    None       Concepcion Living 08/14/18 2247    Tilden Fossa, MD 08/15/18 [phone]

## 2018-08-14 NOTE — ED Triage Notes (Signed)
Patient reports that he passed a kidney stone this AM, but continues to have left flank pain and fever at home. Patient reports that he is voiding small amounts.

## 2018-08-15 DIAGNOSIS — N136 Pyonephrosis: Secondary | ICD-10-CM | POA: Diagnosis present

## 2018-08-15 DIAGNOSIS — N132 Hydronephrosis with renal and ureteral calculous obstruction: Secondary | ICD-10-CM | POA: Diagnosis not present

## 2018-08-15 DIAGNOSIS — N10 Acute tubulo-interstitial nephritis: Secondary | ICD-10-CM | POA: Diagnosis not present

## 2018-08-15 DIAGNOSIS — Z885 Allergy status to narcotic agent status: Secondary | ICD-10-CM | POA: Diagnosis not present

## 2018-08-15 DIAGNOSIS — F902 Attention-deficit hyperactivity disorder, combined type: Secondary | ICD-10-CM | POA: Diagnosis not present

## 2018-08-15 DIAGNOSIS — A419 Sepsis, unspecified organism: Secondary | ICD-10-CM | POA: Diagnosis not present

## 2018-08-15 DIAGNOSIS — R1032 Left lower quadrant pain: Secondary | ICD-10-CM | POA: Diagnosis not present

## 2018-08-15 DIAGNOSIS — I471 Supraventricular tachycardia: Secondary | ICD-10-CM | POA: Diagnosis present

## 2018-08-15 DIAGNOSIS — Z87891 Personal history of nicotine dependence: Secondary | ICD-10-CM | POA: Diagnosis not present

## 2018-08-15 DIAGNOSIS — Z87442 Personal history of urinary calculi: Secondary | ICD-10-CM | POA: Diagnosis not present

## 2018-08-15 DIAGNOSIS — N1330 Unspecified hydronephrosis: Secondary | ICD-10-CM | POA: Diagnosis not present

## 2018-08-15 DIAGNOSIS — M4150 Other secondary scoliosis, site unspecified: Secondary | ICD-10-CM | POA: Diagnosis present

## 2018-08-15 DIAGNOSIS — R109 Unspecified abdominal pain: Secondary | ICD-10-CM | POA: Diagnosis not present

## 2018-08-15 DIAGNOSIS — Z9109 Other allergy status, other than to drugs and biological substances: Secondary | ICD-10-CM | POA: Diagnosis not present

## 2018-08-15 DIAGNOSIS — Z79899 Other long term (current) drug therapy: Secondary | ICD-10-CM | POA: Diagnosis not present

## 2018-08-15 DIAGNOSIS — G43709 Chronic migraine without aura, not intractable, without status migrainosus: Secondary | ICD-10-CM | POA: Diagnosis not present

## 2018-08-15 DIAGNOSIS — K219 Gastro-esophageal reflux disease without esophagitis: Secondary | ICD-10-CM | POA: Diagnosis present

## 2018-08-15 DIAGNOSIS — N200 Calculus of kidney: Secondary | ICD-10-CM | POA: Diagnosis not present

## 2018-08-15 DIAGNOSIS — R000 Tachycardia, unspecified: Secondary | ICD-10-CM | POA: Diagnosis not present

## 2018-08-15 DIAGNOSIS — G43909 Migraine, unspecified, not intractable, without status migrainosus: Secondary | ICD-10-CM | POA: Diagnosis present

## 2018-08-15 DIAGNOSIS — Z882 Allergy status to sulfonamides status: Secondary | ICD-10-CM | POA: Diagnosis not present

## 2018-08-15 DIAGNOSIS — R509 Fever, unspecified: Secondary | ICD-10-CM | POA: Diagnosis not present

## 2018-08-15 DIAGNOSIS — E669 Obesity, unspecified: Secondary | ICD-10-CM | POA: Diagnosis present

## 2018-08-15 DIAGNOSIS — Z6838 Body mass index (BMI) 38.0-38.9, adult: Secondary | ICD-10-CM | POA: Diagnosis not present

## 2018-08-15 LAB — BASIC METABOLIC PANEL
Anion gap: 7 (ref 5–15)
BUN: 15 mg/dL (ref 6–20)
CO2: 28 mmol/L (ref 22–32)
Calcium: 8.4 mg/dL — ABNORMAL LOW (ref 8.9–10.3)
Chloride: 105 mmol/L (ref 98–111)
Creatinine, Ser: 1 mg/dL (ref 0.61–1.24)
GFR calc Af Amer: >60 mL/min (ref >60)
GFR calc non Af Amer: >60 mL/min (ref >60)
Glucose, Bld: 96 mg/dL (ref 70–99)
Potassium: 3.7 mmol/L (ref 3.5–5.1)
Sodium: 140 mmol/L (ref 135–145)

## 2018-08-15 LAB — CBC
HCT: 40 % (ref 39.0–52.0)
Hemoglobin: 13.5 g/dL (ref 13.0–17.0)
MCH: 30.3 pg (ref 26.0–34.0)
MCHC: 33.8 g/dL (ref 30.0–36.0)
MCV: 89.7 fL (ref 78.0–100.0)
Platelets: 175 10*3/uL (ref 150–400)
RBC: 4.46 MIL/uL (ref 4.22–5.81)
RDW: 12.8 % (ref 11.5–15.5)
WBC: 16.9 10*3/uL — ABNORMAL HIGH (ref 4.0–10.5)

## 2018-08-15 LAB — HIV ANTIBODY (ROUTINE TESTING W REFLEX): HIV Screen 4th Generation wRfx: NONREACTIVE

## 2018-08-15 LAB — PROCALCITONIN: Procalcitonin: 0.5 ng/mL

## 2018-08-15 LAB — LACTIC ACID, PLASMA: Lactic Acid, Venous: 1.7 mmol/L (ref 0.5–1.9)

## 2018-08-15 MED ORDER — ALUM & MAG HYDROXIDE-SIMETH 200-200-20 MG/5ML PO SUSP
30.0000 mL | Freq: Four times a day (QID) | ORAL | Status: DC | PRN
  Administered 2018-08-15: 30 mL via ORAL
  Filled 2018-08-15 (×2): qty 30

## 2018-08-15 MED ORDER — KETOROLAC TROMETHAMINE 30 MG/ML IJ SOLN
30.0000 mg | Freq: Four times a day (QID) | INTRAMUSCULAR | Status: DC | PRN
  Administered 2018-08-15 – 2018-08-16 (×5): 30 mg via INTRAVENOUS
  Filled 2018-08-15 (×5): qty 1

## 2018-08-15 MED ORDER — VANCOMYCIN HCL 10 G IV SOLR
1250.0000 mg | Freq: Two times a day (BID) | INTRAVENOUS | Status: DC
  Administered 2018-08-15 – 2018-08-17 (×4): 1250 mg via INTRAVENOUS
  Filled 2018-08-15 (×4): qty 1250

## 2018-08-15 MED ORDER — PANTOPRAZOLE SODIUM 40 MG PO TBEC
40.0000 mg | DELAYED_RELEASE_TABLET | Freq: Two times a day (BID) | ORAL | Status: DC
  Administered 2018-08-15 – 2018-08-17 (×5): 40 mg via ORAL
  Filled 2018-08-15 (×5): qty 1

## 2018-08-15 MED ORDER — OXYCODONE HCL 5 MG PO TABS
5.0000 mg | ORAL_TABLET | Freq: Four times a day (QID) | ORAL | Status: DC | PRN
  Administered 2018-08-15 (×2): 5 mg via ORAL
  Filled 2018-08-15 (×2): qty 1

## 2018-08-15 MED ORDER — BUTALBITAL-APAP-CAFFEINE 50-325-40 MG PO TABS: 1.0000 | ORAL_TABLET | Freq: Four times a day (QID) | ORAL | Status: DC | PRN

## 2018-08-15 NOTE — Care Management Note (Signed)
Case Management Note  Patient Details  Name: Thomas Howell MRN: [id number] Date of Birth: November 27, 1991  Subjective/Objective:       Hospital day 1            Discharge readiness is indicated by patient meeting Recovery Milestones, including ALL of the following: ? Hemodynamic stability temp=100.6/resp.-27/bp=143/97 ? Tachypnea absent  Heart rate 128-140   ? Hypoxemia absent 02 sat at room air=95-99% ? Afebrile, or temperature acceptable for next level of care 100.6 ? Oxygen absent or at baseline need  No o2 needs ? Mental status at baseline alert ? Antibiotic regimen acceptable for next level of care ? IV ns at 125cc/hrs, iv roechpin 2gms q24 hours/Iv vancomycin q12 hours. ? Ambulatory ? Oral hydration, medications, and diet-NPO ? Discharge plans and education understood not at this time   Action/Plan: Will follow for progression and cm needs  Expected Discharge Date:  08/16/18               Expected Discharge Plan:  Assisted Living / Rest Home  In-House Referral:  Clinical Social Work  Discharge planning Services  CM Consult  Post Acute Care Choice:    Choice offered to:     DME Arranged:    DME Agency:     HH Arranged:    HH Agency:     Status of Service:  In process, will continue to follow  If discussed at Long Length of Stay Meetings, dates discussed:    Additional Comments:  Golda AcreDavis, Aliciana Ricciardi Lynn, RN 08/15/2018, 2:04 PM

## 2018-08-15 NOTE — Progress Notes (Signed)
Pharmacy Antibiotic Note  Thomas Howell is a 27 y.o. male admitted on 08/14/2018 with Complicated pyelonephritis.  Pharmacy has been consulted for Vancomycin dosing.  Plan: Vancomycin 2gm iv x1, then 1250mg  iv q12hr--spoke with Dr. Clyde LundborgNiu and he wished to continue vancomycin per pharmacy  Goal AUC = 400 - 500 for all indications, except meningitis (goal AUC > 500 and Cmin 15-20 mcg/mL)   Height: 5\' 9"  (175.3 cm) Weight: 258 lb (117 kg) IBW/kg (Calculated) : 70.7  Temp (24hrs), Avg:99.5 F (37.5 C), Min:98.9 F (37.2 C), Max:100.4 F (38 C)  Recent Labs  Lab 08/14/18 2000 08/14/18 2002 08/15/18 0132  WBC 16.9*  --   --   CREATININE 0.96  --   --   LATICACIDVEN  --  0.90 1.7    Estimated Creatinine Clearance: 147.1 mL/min (by C-G formula based on SCr of 0.96 mg/dL).    Allergies  Allergen Reactions  . Bactrim [Sulfamethoxazole-Trimethoprim] Nausea And Vomiting  . Codeine Swelling    Specifically cough syrup w/ codeine cause lip swelling  . Adhesive [Tape] Rash    Paper tape ok    Antimicrobials this admission: Vancomycin 08/15/2018  Ceftriaxone 08/15/2018 >>   Dose adjustments this admission: -  Microbiology results: -  Thank you for allowing pharmacy to be a part of this patient's care.  Thomas DavidsonGrimsley Howell, Thomas Howell 08/15/2018 5:12 AM

## 2018-08-15 NOTE — Progress Notes (Signed)
PROGRESS NOTE  Thomas Howell ZOX:[id number] DOB: [birth year] DOA: 08/14/2018 PCP: Merlyn Albert, MD  HPI/Recap of past 24 hours: Thomas Howell is a 27 y.o. male with medical history significant for multiple kidney and ureteral stone, hydronephrosis, scoliosis, migraine headaches, ADHD, SVT (ablation), who presents with fever, chills, nausea, vomiting, left flank pain for the past day prior to admission. Patient reports history of multiple kidney/ureteral stone which required about 18 rounds of stent placement and removal, currently has no stents. Pt recently passed a small stone, ~0.4 cm in size prior to coming to the ED, then started having severe left flank pain, nausea, vomiting. Pt reported chills and a fever of about 102 at home. Patient is followed by Dr. Mena Goes with [facility] and was told to go to the ER. In the ED, pt was found to have WBC 16.9, positive urinalysis. CT renal stone protocol showed bilateral nephrolithiasis, left hydronephrosis and hydroureter without obstructing calculus, likely reflecting recent passage of a stone. Pt admitted for further management   Today, pt still reported ongoing L flank pain, currently denies any vomiting. Reports heartburn "feels like I drank a bottle of hot sauce". Denies any chest pain, abdominal pain, SOB. Still febrile as of this am.  Assessment/Plan: Principal Problem:   Acute pyelonephritis Active Problems:   Nephrolithiasis   Chronic migraine   Attention deficit hyperactivity disorder (ADHD), combined type   SVT (supraventricular tachycardia) (HCC)   Bilateral hydronephrosis   Sepsis (HCC)  Sepsis likely 2/2 acute pyelonephritis Patient meets criteria for sepsis with leukocytosis, tachycardia Currently febrile with leukocytosis Lactic acid WNL Procalcitonin 0.50 UA showed large leukocytes, WBC >50 UC pending BC X 2 pending CT renal stone showed: Bilateral nephrolithiasis. Left hydronephrosis and hydroureter  without obstructing calculus, likely reflecting recent passage of a stone Urology consulted Continue IV Rocephin, Vancomycin (plan to de-escalate once UC is back) IVF, pain and symptomatic management  ?GERD Reports heartburn Start pantoprazole BID  Hx of recurrent nephrolithiasis with hydronephrosis requiring multiple stent placement Follows [facility] as an outpt Pt was placed on HCTZ, currently held due to sepsis  Chronic migraine Follows outpt neurology Pt receives Botox, last dose was in June Continue home floricet, Imitrex prn States he also takes Keppra and Lamictal, continue  Attention deficit hyperactivity disorder (ADHD), combined type: Stable  Hx of SVT S/P ablation Currently tachycardic, likely due to sepsis Monitor closely  Obesity Advised lifestyle modification    Code Status: Full  Family Communication: None at bedside  Disposition Plan: Once significant improvement   Consultants:  Urology  Procedures:  None  Antimicrobials:  Vancomycin  Ceftriaxone   DVT prophylaxis: Lovenox   Objective: Vitals:   08/15/18 0040 08/15/18 0054 08/15/18 0554 08/15/18 0825  BP: 136/89 137/88 (!) 143/78 109/66  Pulse: (!) 113 (!) 117 98 (!) 106  Resp: (!) 27 19 18 16   Temp: 98.9 F (37.2 C) (!) 100.4 F (38 C) 99 F (37.2 C) (!) 100.6 F (38.1 C)  TempSrc: Oral Oral Oral Oral  SpO2: 97% 99% 98% 96%  Weight:  117 kg    Height:  5\' 9"  (1.753 m)      Intake/Output Summary (Last 24 hours) at 08/15/2018 1334 Last data filed at 08/14/2018 2048 Gross per 24 hour  Intake 99.49 ml  Output -  Net 99.49 ml   Filed Weights   08/14/18 1719 08/14/18 2007 08/15/18 0054  Weight: 104.3 kg 104.3 kg 117 kg    Exam:   General:  NAD  Cardiovascular: S1, S2 present  Respiratory: CTAB  Abdomen: Soft, obese, L flank tenderness, BS present  Musculoskeletal: No pedal edema bilaterally  Skin: Normal   Psychiatry: Normal mood   Data  Reviewed: CBC: Recent Labs  Lab 08/14/18 2000 08/15/18 0618  WBC 16.9* 16.9*  NEUTROABS 13.1*  --   HGB 15.0 13.5  HCT 42.4 40.0  MCV 87.1 89.7  PLT 197 175   Basic Metabolic Panel: Recent Labs  Lab 08/14/18 2000 08/15/18 0618  NA 138 140  K 3.4* 3.7  CL 102 105  CO2 25 28  GLUCOSE 106* 96  BUN 17 15  CREATININE 0.96 1.00  CALCIUM 9.2 8.4*   GFR: Estimated Creatinine Clearance: 141.2 mL/min (by C-G formula based on SCr of 1 mg/dL). Liver Function Tests: Recent Labs  Lab 08/14/18 2000  AST 33  ALT 46*  ALKPHOS 66  BILITOT 0.8  PROT 8.0  ALBUMIN 4.1   No results for input(s): LIPASE, AMYLASE in the last 168 hours. No results for input(s): AMMONIA in the last 168 hours. Coagulation Profile: No results for input(s): INR, PROTIME in the last 168 hours. Cardiac Enzymes: No results for input(s): CKTOTAL, CKMB, CKMBINDEX, TROPONINI in the last 168 hours. BNP (last 3 results) No results for input(s): PROBNP in the last 8760 hours. HbA1C: No results for input(s): HGBA1C in the last 72 hours. CBG: No results for input(s): GLUCAP in the last 168 hours. Lipid Profile: No results for input(s): CHOL, HDL, LDLCALC, TRIG, CHOLHDL, LDLDIRECT in the last 72 hours. Thyroid Function Tests: No results for input(s): TSH, T4TOTAL, FREET4, T3FREE, THYROIDAB in the last 72 hours. Anemia Panel: No results for input(s): VITAMINB12, FOLATE, FERRITIN, TIBC, IRON, RETICCTPCT in the last 72 hours. Urine analysis:    Component Value Date/Time   COLORURINE YELLOW 08/14/2018 1729   APPEARANCEUR CLEAR 08/14/2018 1729   LABSPEC 1.019 08/14/2018 1729   PHURINE 6.0 08/14/2018 1729   GLUCOSEU NEGATIVE 08/14/2018 1729   HGBUR SMALL (A) 08/14/2018 1729   BILIRUBINUR NEGATIVE 08/14/2018 1729   KETONESUR NEGATIVE 08/14/2018 1729   PROTEINUR 30 (A) 08/14/2018 1729   UROBILINOGEN 0.2 08/18/2015 1900   NITRITE NEGATIVE 08/14/2018 1729   LEUKOCYTESUR LARGE (A) 08/14/2018 1729   Sepsis  Labs: @LABRCNTIP (procalcitonin:4,lacticidven:4)  ) Recent Results (from the past 240 hour(s))  Blood Culture (routine x 2)     Status: None (Preliminary result)   Collection Time: 08/14/18  7:47 PM  Result Value Ref Range Status   Specimen Description   Final    BLOOD RIGHT HAND Performed at [facility], [address], [place], [place] [address]    Special Requests   Final    BOTTLES DRAWN AEROBIC AND ANAEROBIC Blood Culture adequate volume Performed at [facility], [address] [place], [place] [address]    Culture   Final    NO GROWTH < 12 HOURS Performed at [facility], [address], [place], [place] [address]    Report Status PENDING  Incomplete  Blood Culture (routine x 2)     Status: None (Preliminary result)   Collection Time: 08/14/18  8:05 PM  Result Value Ref Range Status   Specimen Description   Final    BLOOD LEFT HAND Performed at [facility], [address], [place], [place] [address]    Special Requests   Final    BOTTLES DRAWN AEROBIC AND ANAEROBIC Blood Culture results may not be optimal due to an  excessive volume of blood received in culture bottles Performed at [facility], [address] [place], [place] [address]    Culture   Final    NO GROWTH < 12 HOURS Performed at [facility], [address] [place], [place] [address]    Report Status PENDING  Incomplete      Studies: Ct Renal Stone Study  Result Date: 08/14/2018 CLINICAL DATA:  LEFT flank pain.  Recent passage of a kidney stone. EXAM: CT ABDOMEN AND PELVIS WITHOUT CONTRAST TECHNIQUE: Multidetector CT imaging of the abdomen and pelvis was performed following the standard protocol without IV contrast. COMPARISON:  CT urogram 03/12/2018. FINDINGS: Lower chest: No acute abnormality. Hepatobiliary: Low attenuation of the liver consistent with steatosis. No focal abnormality. No gallstones, gallbladder  wall thickening, or biliary ductal dilatation. Pancreas: Unremarkable. No pancreatic ductal dilatation or surrounding inflammatory changes. Spleen: Normal in size without focal abnormality. Adrenals/Urinary Tract: Normal adrenal glands. BILATERAL nephrolithiasis; the largest stones are dependent in the LEFT collecting system, up to 5 mm in length. LEFT hydronephrosis and hydroureter without obstructing calculus. Normal bladder. Stomach/Bowel: Stomach is within normal limits. Appendix appears normal. No evidence of bowel wall thickening, distention, or inflammatory changes. Vascular/Lymphatic: No significant vascular findings are present. No enlarged abdominal or pelvic lymph nodes. Reproductive: Prostate is unremarkable. Other: Tiny umbilical hernia.  No abdominopelvic ascites. Musculoskeletal: Transitional lumbosacral anatomy. Degenerative scoliosis convex LEFT mid lumbar region. IMPRESSION: BILATERAL nephrolithiasis. LEFT hydronephrosis and hydroureter without obstructing calculus, likely reflecting recent passage of a stone. Hepatic steatosis. Electronically Signed   By: Elsie StainJohn T Curnes M.D.   On: 08/14/2018 20:58    Scheduled Meds: . enoxaparin (LOVENOX) injection  40 mg Subcutaneous QHS  . lamoTRIgine  100 mg Oral BID  . levETIRAcetam  1,500 mg Oral QHS  . levETIRAcetam  750 mg Oral Daily  . pantoprazole  40 mg Oral BID  . potassium chloride  15 mEq Oral BID    Continuous Infusions: . sodium chloride 125 mL/hr at 08/15/18 0952  . cefTRIAXone (ROCEPHIN)  IV    . vancomycin       LOS: 0 days     Briant CedarNkeiruka J Toshio Slusher, MD [facility]   If 7PM-7AM, please contact night-coverage [email or web address] Password [email or web address] 08/15/2018, 1:34 PM

## 2018-08-15 NOTE — Progress Notes (Signed)
1219 - this nurse obtained report from the ED nurse Irving BurtonEmily who will start IV fluids & Vanc prior to sending pt up

## 2018-08-15 NOTE — Consult Note (Signed)
H&P Physician requesting consult: Andreas NewportNkeiruka Ezenduka, MD  Chief Complaint: Left mild hydronephrosis, fever, recently passed calculus  History of Present Illness: 27 year old male with a history of recurrent nephrolithiasis.  He has had over 18 procedures for renal calculi.  He is followed by Dr. Mena GoesEskridge.  Yesterday, the patient experienced severe left-sided flank pain.  He subsequently passed a small stone.  Soon after, he began to have flulike symptoms including nausea, aches and pains, fever.  He presented to the emergency department and a CT scan was performed which showed mild left hydroureteronephrosis with no obstructing calculus.  He continues to have some left-sided flank pain.  He also continues to have cyclical fever.  Maximum temperature was 103 today.  Most recent temperature was 99.4.  White blood cell count 16.  Creatinine is 1.  He has some mild tachycardia.  Blood pressures have been stable.  He continues to complain about some flulike symptoms including body aches.  He has decreased appetite.  Past Medical History:  Diagnosis Date  . Complication of anesthesia   . Frequency-urgency syndrome   . History of kidney stones   . Hx of nausea and vomiting    d/t kidney stone  . Hx of sepsis 08/11/2017   due to kidney stone/ hydronephrosis  . Left ureteral stone   . Migraine   . Pain due to ureteral stent (HCC)   . PONV (postoperative nausea and vomiting)    none recently  . Renal calculi    bilateral per ct 07-26-2016  . Scoliosis of lumbar spine 1994   treated at [facility] until age 27  . Wears glasses    Past Surgical History:  Procedure Laterality Date  . CYSTOSCOPY W/ RETROGRADES Right 07/06/2015   Procedure: CYSTOSCOPY WITH RETROGRADE PYELOGRAM;  Surgeon: Jerilee FieldMatthew Eskridge, MD;  Location: [facility];  Service: Urology;  Laterality: Right;  . CYSTOSCOPY W/ URETERAL STENT PLACEMENT Left 08/08/2015   Procedure: CYSTOSCOPY WITH STENT REPLACEMENT;  Surgeon: Jerilee FieldMatthew Eskridge, MD;   [facility] [place];  Service: Urology;  Laterality: Left;  . CYSTOSCOPY W/ URETERAL STENT PLACEMENT Left 02/21/2017   Procedure: CYSTOSCOPY WITH RETROGRADE PYELOGRAM/URETERAL LEFT STENT PLACEMENT WITH  LASER;  Surgeon: Bjorn PippinJohn Wrenn, MD;  Location: [facility];  Service: Urology;  Laterality: Left;  . CYSTOSCOPY W/ URETERAL STENT PLACEMENT Left 08/10/2017   Procedure: CYSTOSCOPY WITH RETROGRADE PYELOGRAM/URETERAL STENT PLACEMENT;  Surgeon: Heloise PurpuraBorden, Lester, MD;  Location: [facility];  Service: Urology;  Laterality: Left;  . CYSTOSCOPY WITH RETROGRADE PYELOGRAM, URETEROSCOPY AND STENT PLACEMENT Left 06/24/2014   Procedure: CYSTOSCOPY WITH RETROGRADE PYELOGRAM,  AND STENT PLACEMENT;  Surgeon: Magdalene Mollyaniel Y Woodruff, MD;  Location: [facility];  Service: Urology;  Laterality: Left;  . CYSTOSCOPY WITH RETROGRADE PYELOGRAM, URETEROSCOPY AND STENT PLACEMENT Left 07/05/2014   Procedure: CYSTO/LEFT URETEROSCOPY/LEFT RETROGRADE PYELOGRAM/LEFT STENT PLACEMENT;  Surgeon: Jerilee FieldMatthew Eskridge, MD;  [facility] [place];  Service: Urology;  Laterality: Left;  . CYSTOSCOPY WITH RETROGRADE PYELOGRAM, URETEROSCOPY AND STENT PLACEMENT Left 07/06/2015   Procedure: CYSTOSCOPY WITH RETROGRADE PYELOGRAM, URETEROSCOPY , LASER, STENT PLACEMENT and BASKET EXTRACTION;  Surgeon: Jerilee FieldMatthew Eskridge, MD;  Location[facility];  Service: Urology;  Laterality: Left;  . CYSTOSCOPY WITH RETROGRADE PYELOGRAM, URETEROSCOPY AND STENT PLACEMENT Left 08/19/2015   Procedure: CYSTOSCOPY WITH RETROGRADE PYELOGRAM, URETEROSCOPY AND STENT PLACEMENT;  Surgeon: Jerilee FieldMatthew Eskridge, MD;  Location[facility];  Service: Urology;  Laterality: Left;  . CYSTOSCOPY WITH RETROGRADE PYELOGRAM, URETEROSCOPY AND STENT PLACEMENT Left 03/13/2018   Procedure: CYSTOSCOPY WITH RETROGRADE PYELOGRAM, URETEROSCOPY  AND LEFT STENT PLACEMENT;  Surgeon: Bjorn Pippin, MD;  Location[facility];  Service: Urology;  Laterality: Left;  . CYSTOSCOPY WITH URETEROSCOPY AND STENT PLACEMENT Left  01/16/2016   Procedure: CYSTOSCOPY WITH LEFT RETROGRADE PYELOGRAM  LEFT DIGITAL URETEROSCOPY AND PLACEMENT LEFT URETERAL STENT;  Surgeon: Jerilee Field, MD;  [facility];  Service: Urology;  Laterality: Left;  . CYSTOSCOPY WITH URETEROSCOPY, STONE BASKETRY AND STENT PLACEMENT Left 02/13/2016   Procedure: CYSTOSCOPY WITH LEFT URETEROSCOPY, HOLMIUM LASER AND STENT PLACEMENT;  Surgeon: Jerilee Field, MD;  [facility];  Service: Urology;  Laterality: Left;  . CYSTOSCOPY/RETROGRADE/URETEROSCOPY/STONE EXTRACTION WITH BASKET Left 08/08/2015   Procedure: CYSTOSCOPY/RETROGRADE/URETEROSCOPY/STONE EXTRACTION WITH BASKET;  Surgeon: Jerilee Field, MD;  [facility];  Service: Urology;  Laterality: Left;  . CYSTOSCOPY/URETEROSCOPY/HOLMIUM LASER/STENT PLACEMENT Left 07/30/2016   Procedure: CYSTOSCOPY/URETEROSCOPY/HOLMIUM LASER/STENT PLACEMENT;  Surgeon: Jerilee Field, MD;  Location: [facility];  Service: Urology;  Laterality: Left;  . CYSTOSCOPY/URETEROSCOPY/HOLMIUM LASER/STENT PLACEMENT Left 08/19/2017   Procedure: CYSTOSCOPY/URETEROSCOPY/HOLMIUM LASER/STENT PLACEMENT;  Surgeon: Jerilee Field, MD;  Location: [facility];  Service: Urology;  Laterality: Left;  . EXTRACORPOREAL SHOCK WAVE LITHOTRIPSY Left 07-07-2015  &  12-26-2014  . 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 HOLMIUM LASER APPLICATION Left 07/05/2014   Procedure: LASER LITHO;  Surgeon: Jerilee Field, MD;  Location: [facility];  Service: Urology;  Laterality: Left;  . HOLMIUM LASER APPLICATION Left 08/08/2015   Procedure: HOLMIUM LASER  WITH LITHOTRIPSY ;  Surgeon: Jerilee Field, MD;  Location: [facility];  Service: Urology;  Laterality: Left;  . HOLMIUM LASER APPLICATION Left 02/21/2017   Procedure: HOLMIUM LASER APPLICATION;  Surgeon: Bjorn Pippin, MD;  [facility];   Service: Urology;  Laterality: Left;  . HOLMIUM LASER APPLICATION Left 03/13/2018   Procedure: HOLMIUM LASER APPLICATION;  Surgeon: Bjorn Pippin, MD;  [facility];  Service: Urology;  Laterality: Left;  . LYMPH GLAND EXCISION  2003   neck--  benign  . SVT ABLATION N/A 05/22/2018   Procedure: SVT ABLATION;  Surgeon: Marinus Maw, MD;  Location: [facility];  Service: Cardiovascular;  Laterality: N/A;    Home Medications:  Medications Prior to Admission  Medication Sig Dispense Refill Last Dose  . acetaminophen (TYLENOL) 500 MG tablet Take 500 mg by mouth every 4 (four) hours as needed for fever.   08/14/2018 at Unknown time  . BOTOX 100 units SOLR injection INJECT IN THE MUSCLE TO HEAD AND NECK MUSCLES EVERY 3 MONTHS BY PROVIDER (Patient taking differently: Inject 100 Units into the muscle every 3 (three) months. ) 2 each 2 06/14/2018  . butalbital-acetaminophen-caffeine (FIORICET, ESGIC) 50-325-40 MG tablet Take 1 tablet by mouth every 6 (six) hours as needed for headache. 12 tablet 3 Past Month at Unknown time  . hydrochlorothiazide (HYDRODIURIL) 25 MG tablet Take 25 mg by mouth 2 (two) times daily.   08/14/2018 at Unknown time  . lamoTRIgine (LAMICTAL) 100 MG tablet One po bid (Patient taking differently: Take 100 mg by mouth 2 (two) times daily. ) 180 tablet 3 08/14/2018 at Unknown time  . levETIRAcetam (KEPPRA) 750 MG tablet TAKE 1 TABLET BY MOUTH EVERY MORNING AND 2 TABLETS EVERY NIGHT AT BEDTIME 270 tablet 3 08/14/2018 at Unknown time  . ondansetron (ZOFRAN ODT) 4 MG disintegrating tablet Take 1 tablet (4 mg total) by mouth every 8 (eight) hours as needed for nausea or  vomiting. 20 tablet 0 08/14/2018 at Unknown time  . Potassium Citrate 15 MEQ (1620 MG) TBCR Take 30 mEq by mouth 2 (two) times daily.  9 08/14/2018 at Unknown time  . rizatriptan (MAXALT-MLT) 10 MG disintegrating tablet Take one tablet at onset of headache. May repeat once in 2 hours. Max of 2 tablets in 24 hours and 4  tablets per week 10 tablet 0 08/13/2018 at Unknown time  . amphetamine-dextroamphetamine (ADDERALL) 10 MG tablet Take one po qam then one po 4 hours later (Patient not taking: Reported on 08/14/2018) 60 tablet 0 Not Taking at Unknown time   Allergies:  Allergies  Allergen Reactions  . Bactrim [Sulfamethoxazole-Trimethoprim] Nausea And Vomiting  . Codeine Swelling    Specifically cough syrup w/ codeine cause lip swelling  . Adhesive [Tape] Rash    Paper tape ok    Family History  Problem Relation Age of Onset  . Cancer Father   . Kidney Stones Mother   . Cancer Other   . Hypertension Other   . Hyperlipidemia Other   . Stroke Other   . Kidney Stones Brother    Social History:  reports that he quit smoking about 4 years ago. He quit after 2.00 years of use. He has never used smokeless tobacco. He reports that he drinks alcohol. He reports that he does not use drugs.  ROS: A complete review of systems was performed.  All systems are negative except for pertinent findings as noted. ROS   Physical Exam:  Vital signs in last 24 hours: Temp:  [98.9 F (37.2 C)-103.1 F (39.5 C)] 99.4 F (37.4 C) (08/27 1632) Pulse Rate:  [95-117] 108 (08/27 1632) Resp:  [14-27] 17 (08/27 1632) BP: (102-159)/(66-96) 141/78 (08/27 1632) SpO2:  [95 %-99 %] 98 % (08/27 1632) Weight:  [104.3 kg-117 kg] 117 kg (08/27 0054) General:  Alert and oriented, No acute distress HEENT: Normocephalic, atraumatic Neck: No JVD or lymphadenopathy Cardiovascular: Mild tachycardia, normal rhythm, normotensive Lungs: Regular rate and effort Abdomen: Soft, nontender, nondistended, no abdominal masses Back: No CVA tenderness Extremities: No edema Neurologic: Grossly intact  Laboratory Data:  Results for orders placed or performed during the hospital encounter of 08/14/18 (from the past 24 hour(s))  Blood Culture (routine x 2)     Status: None (Preliminary result)   Collection Time: 08/14/18  7:47 PM  Result  Value Ref Range   Specimen Description      BLOOD RIGHT HAND Performed at [facility], [address], [place], [place] [address]    Special Requests      BOTTLES DRAWN AEROBIC AND ANAEROBIC Blood Culture adequate volume Performed at [facility], [address] [place], [place] [address]    Culture      NO GROWTH < 12 HOURS Performed at [facility], [address] [place], [place] [address]    Report Status PENDING   Comprehensive metabolic panel     Status: Abnormal   Collection Time: 08/14/18  8:00 PM  Result Value Ref Range   Sodium 138 135 - 145 mmol/L   Potassium 3.4 (L) 3.5 - 5.1 mmol/L   Chloride 102 98 - 111 mmol/L   CO2 25 22 - 32 mmol/L   Glucose, Bld 106 (H) 70 - 99 mg/dL   BUN 17 6 - 20 mg/dL   Creatinine, Ser 2.37 0.61 - 1.24 mg/dL   Calcium 9.2 8.9 - 62.8 mg/dL   Total Protein 8.0 6.5 - 8.1  g/dL   Albumin 4.1 3.5 - 5.0 g/dL   AST 33 15 - 41 U/L   ALT 46 (H) 0 - 44 U/L   Alkaline Phosphatase 66 38 - 126 U/L   Total Bilirubin 0.8 0.3 - 1.2 mg/dL   GFR calc non Af Amer >60 >60 mL/min   GFR calc Af Amer >60 >60 mL/min   Anion gap 11 5 - 15  CBC WITH DIFFERENTIAL     Status: Abnormal   Collection Time: 08/14/18  8:00 PM  Result Value Ref Range   WBC 16.9 (H) 4.0 - 10.5 K/uL   RBC 4.87 4.22 - 5.81 MIL/uL   Hemoglobin 15.0 13.0 - 17.0 g/dL   HCT 29.5 62.1 - 30.8 %   MCV 87.1 78.0 - 100.0 fL   MCH 30.8 26.0 - 34.0 pg   MCHC 35.4 30.0 - 36.0 g/dL   RDW 65.7 84.6 - 96.2 %   Platelets 197 150 - 400 K/uL   Neutrophils Relative % 77 %   Neutro Abs 13.1 (H) 1.7 - 7.7 K/uL   Lymphocytes Relative 13 %   Lymphs Abs 2.2 0.7 - 4.0 K/uL   Monocytes Relative 10 %   Monocytes Absolute 1.6 (H) 0.1 - 1.0 K/uL   Eosinophils Relative 0 %   Eosinophils Absolute 0.0 0.0 - 0.7 K/uL   Basophils Relative 0 %   Basophils Absolute 0.0 0.0 - 0.1 K/uL  I-Stat CG4 Lactic Acid, ED  (not at  [facility])     Status: None   Collection Time:  08/14/18  8:02 PM  Result Value Ref Range   Lactic Acid, Venous 0.90 0.5 - 1.9 mmol/L  Blood Culture (routine x 2)     Status: None (Preliminary result)   Collection Time: 08/14/18  8:05 PM  Result Value Ref Range   Specimen Description      BLOOD LEFT HAND Performed at [facility], [address], [place], [place] [address]    Special Requests      BOTTLES DRAWN AEROBIC AND ANAEROBIC Blood Culture results may not be optimal due to an excessive volume of blood received in culture bottles Performed at [facility], [address] [place], [place] [address]    Culture      NO GROWTH < 12 HOURS Performed at [facility], [address] [place], [place] [address]    Report Status PENDING   HIV antibody (Routine Testing)     Status: None   Collection Time: 08/15/18  1:32 AM  Result Value Ref Range   HIV Screen 4th Generation wRfx Non Reactive Non Reactive  Lactic acid, plasma     Status: None   Collection Time: 08/15/18  1:32 AM  Result Value Ref Range   Lactic Acid, Venous 1.7 0.5 - 1.9 mmol/L  Procalcitonin     Status: None   Collection Time: 08/15/18  1:32 AM  Result Value Ref Range   Procalcitonin 0.50 ng/mL  Basic metabolic panel     Status: Abnormal   Collection Time: 08/15/18  6:18 AM  Result Value Ref Range   Sodium 140 135 - 145 mmol/L   Potassium 3.7 3.5 - 5.1 mmol/L   Chloride 105 98 - 111 mmol/L   CO2 28 22 - 32 mmol/L   Glucose, Bld 96 70 - 99 mg/dL   BUN 15 6 - 20 mg/dL   Creatinine, Ser 2.53 0.61 - 1.24 mg/dL   Calcium 8.4 (L) 8.9 -  10.3 mg/dL   GFR calc non Af Amer >60 >60 mL/min   GFR calc Af Amer >60 >60 mL/min   Anion gap 7 5 - 15  CBC     Status: Abnormal   Collection Time: 08/15/18  6:18 AM  Result Value Ref Range   WBC 16.9 (H) 4.0 - 10.5 K/uL   RBC 4.46 4.22 - 5.81 MIL/uL   Hemoglobin 13.5 13.0 - 17.0 g/dL   HCT 16.1 09.6 - 04.5 %   MCV 89.7 78.0 - 100.0 fL   MCH 30.3 26.0 - 34.0 pg   MCHC 33.8 30.0  - 36.0 g/dL   RDW 40.9 81.1 - 91.4 %   Platelets 175 150 - 400 K/uL   Recent Results (from the past 240 hour(s))  Blood Culture (routine x 2)     Status: None (Preliminary result)   Collection Time: 08/14/18  7:47 PM  Result Value Ref Range Status   Specimen Description   Final    BLOOD RIGHT HAND Performed at [facility], [address], [place], [place] [address]    Special Requests   Final    BOTTLES DRAWN AEROBIC AND ANAEROBIC Blood Culture adequate volume Performed at [facility], [address], [place], [place] [address]    Culture   Final    NO GROWTH < 12 HOURS Performed at [facility], [address], [place], [place] [address]    Report Status PENDING  Incomplete  Blood Culture (routine x 2)     Status: None (Preliminary result)   Collection Time: 08/14/18  8:05 PM  Result Value Ref Range Status   Specimen Description   Final    BLOOD LEFT HAND Performed at [facility], [address], [place], [place] [address]    Special Requests   Final    BOTTLES DRAWN AEROBIC AND ANAEROBIC Blood Culture results may not be optimal due to an excessive volume of blood received in culture bottles Performed at [facility], [address] [place], [place] [address]    Culture   Final    NO GROWTH < 12 HOURS Performed at [facility], [address] [place], [place] [address]    Report Status PENDING  Incomplete   Creatinine: Recent Labs    08/14/18 2000 08/15/18 0618  CREATININE 0.96 1.00   CT scan personally reviewed and is covered in the history of present illness.  Impression/Assessment:  Mild left hydronephrosis likely secondary to recently passed calculus Sepsis likely secondary to pyelonephritis  Plan:  There is no obstructing stone.  Suspect his mild hydroureteronephrosis is secondary to the recently passed calculus.  Agree with empirically treating with vancomycin and  ceftriaxone.  As long as he remains hemodynamically stable, plan to treat conservatively with antibiotics and try to avoid a stent placement/procedure.  The reason for this is that he is likely not obstructed as evidenced by no stone in the ureter.  Hydronephrosis may be secondary to periureteral edema.  If he becomes hemodynamically unstable or condition worsens, can consider ureteral stent placement.  Ray Church, III 08/15/2018, 5:40 PM

## 2018-08-16 DIAGNOSIS — A419 Sepsis, unspecified organism: Principal | ICD-10-CM

## 2018-08-16 DIAGNOSIS — R109 Unspecified abdominal pain: Secondary | ICD-10-CM

## 2018-08-16 DIAGNOSIS — N200 Calculus of kidney: Secondary | ICD-10-CM

## 2018-08-16 DIAGNOSIS — N10 Acute tubulo-interstitial nephritis: Secondary | ICD-10-CM

## 2018-08-16 LAB — CBC WITH DIFFERENTIAL/PLATELET
Basophils Absolute: 0 10*3/uL (ref 0.0–0.1)
Basophils Relative: 0 %
Eosinophils Absolute: 0 10*3/uL (ref 0.0–0.7)
Eosinophils Relative: 0 %
HCT: 37.1 % — ABNORMAL LOW (ref 39.0–52.0)
Hemoglobin: 12.8 g/dL — ABNORMAL LOW (ref 13.0–17.0)
Lymphocytes Relative: 19 %
Lymphs Abs: 1.7 10*3/uL (ref 0.7–4.0)
MCH: 30.3 pg (ref 26.0–34.0)
MCHC: 34.5 g/dL (ref 30.0–36.0)
MCV: 87.9 fL (ref 78.0–100.0)
Monocytes Absolute: 1.1 10*3/uL — ABNORMAL HIGH (ref 0.1–1.0)
Monocytes Relative: 12 %
Neutro Abs: 6.2 10*3/uL (ref 1.7–7.7)
Neutrophils Relative %: 69 %
Platelets: 158 10*3/uL (ref 150–400)
RBC: 4.22 MIL/uL (ref 4.22–5.81)
RDW: 12.5 % (ref 11.5–15.5)
WBC: 8.9 10*3/uL (ref 4.0–10.5)

## 2018-08-16 LAB — BASIC METABOLIC PANEL
Anion gap: 10 (ref 5–15)
BUN: 13 mg/dL (ref 6–20)
CO2: 24 mmol/L (ref 22–32)
Calcium: 8.5 mg/dL — ABNORMAL LOW (ref 8.9–10.3)
Chloride: 105 mmol/L (ref 98–111)
Creatinine, Ser: 0.9 mg/dL (ref 0.61–1.24)
GFR calc Af Amer: >60 mL/min (ref >60)
GFR calc non Af Amer: >60 mL/min (ref >60)
Glucose, Bld: 100 mg/dL — ABNORMAL HIGH (ref 70–99)
Potassium: 3.7 mmol/L (ref 3.5–5.1)
Sodium: 139 mmol/L (ref 135–145)

## 2018-08-16 LAB — PROCALCITONIN: Procalcitonin: 1.59 ng/mL

## 2018-08-16 MED ORDER — SUMATRIPTAN SUCCINATE 50 MG PO TABS
100.0000 mg | ORAL_TABLET | Freq: Once | ORAL | Status: AC | PRN
  Administered 2018-08-16: 100 mg via ORAL
  Filled 2018-08-16 (×2): qty 2

## 2018-08-16 MED ORDER — OXYCODONE HCL 5 MG PO TABS
5.0000 mg | ORAL_TABLET | ORAL | Status: DC | PRN
  Administered 2018-08-16: 5 mg via ORAL
  Filled 2018-08-16: qty 1

## 2018-08-16 NOTE — Discharge Instructions (Signed)
Kidney Stones Kidney stones (urolithiasis) are rock-like masses that form inside of the kidneys. Kidneys are organs that make pee (urine). A kidney stone can cause very bad pain and can block the flow of pee. The stone usually leaves your body (passes) through your pee. You may need to have a doctor take out the stone. Follow these instructions at home: Eating and drinking  Drink enough fluid to keep your pee clear or pale yellow. This will help you pass the stone.  If told by your doctor, change the foods you eat (your diet). This may include: ? Limiting how much salt (sodium) you eat. ? Eating more fruits and vegetables. ? Limiting how much meat, poultry, fish, and eggs you eat.  Follow instructions from your doctor about eating or drinking restrictions. General instructions  Collect pee samples as told by your doctor. You may need to collect a pee sample: ? 24 hours after a stone comes out. ? 8-12 weeks after a stone comes out, and every 6-12 months after that.  Strain your pee every time you pee (urinate), for as long as told. Use the strainer that your doctor recommends.  Do not throw out the stone. Keep it so that it can be tested by your doctor.  Take over-the-counter and prescription medicines only as told by your doctor.  Keep all follow-up visits as told by your doctor. This is important. You may need follow-up tests. Preventing kidney stones To prevent another kidney stone:  Drink enough fluid to keep your pee clear or pale yellow. This is the best way to prevent kidney stones.  Eat healthy foods.  Avoid certain foods as told by your doctor. You may be told to eat less protein.  Stay at a healthy weight.  Contact a doctor if:  You have pain that gets worse or does not get better with medicine. Get help right away if:  You have a fever or chills.  You get very bad pain.  You get new pain in your belly (abdomen).  You pass out (faint).  You cannot pee. This  information is not intended to replace advice given to you by your health care provider. Make sure you discuss any questions you have with your health care provider. Document Released: 05/24/2008 Document Revised: 08/24/2016 Document Reviewed: 08/24/2016 Elsevier Interactive Patient Education  2017 [employer]   Pyelonephritis, Adult Pyelonephritis is a kidney infection. The kidneys are organs that help clean your blood by moving waste out of your blood and into your pee (urine). This infection can happen quickly, or it can last for a long time. In most cases, it clears up with treatment and does not cause other problems. Follow these instructions at home: Medicines  Take over-the-counter and prescription medicines only as told by your doctor.  Take your antibiotic medicine as told by your doctor. Do not stop taking the medicine even if you start to feel better. General instructions  Drink enough fluid to keep your pee clear or pale yellow.  Avoid caffeine, tea, and carbonated drinks.  Pee (urinate) often. Avoid holding in pee for long periods of time.  Pee before and after sex.  After pooping (having a bowel movement), women should wipe from front to back. Use each tissue only once.  Keep all follow-up visits as told by your doctor. This is important. Contact a doctor if:  You do not feel better after 2 days.  Your symptoms get worse.  You have a fever. Get help right  away if: °· You cannot take your medicine or drink fluids as told. °· You have chills and shaking. °· You throw up (vomit). °· You have very bad pain in your side (flank) or back. °· You feel very weak or you pass out (faint). °This information is not intended to replace advice given to you by your health care provider. Make sure you discuss any questions you have with your health care provider. °Document Released: 01/13/2005 Document Revised: 05/13/2016 Document Reviewed: 03/31/2015 °Elsevier Interactive Patient  Education © 2018 Elsevier Inc. ° °

## 2018-08-16 NOTE — Progress Notes (Addendum)
Subjective: Patient reports he is feeling much better. No flank pain and nausea resolved. Feeling like "the flu" resolved.   Objective: Vital signs in last 24 hours: Temp:  [98.5 F (36.9 C)-103.1 F (39.5 C)] 99.6 F (37.6 C) (08/28 0505) Pulse Rate:  [88-108] 97 (08/28 0505) Resp:  [17-18] 18 (08/28 0505) BP: (122-159)/(78-96) 142/83 (08/28 0505) SpO2:  [98 %-100 %] 98 % (08/28 0505)  Intake/Output from previous day: 08/27 0701 - 08/28 0700 In: 2852.3 [I.V.:2502.3; IV Piggyback:350] Out: -  Intake/Output this shift: No intake/output data recorded.  Physical Exam:  NAD Watching TV Alert and Oriented Abd - soft, NT - no CVAT Ext - no LE swelling or pain   Lab Results: Recent Labs    08/14/18 2000 08/15/18 0618 08/16/18 0556  HGB 15.0 13.5 12.8*  HCT 42.4 40.0 37.1*   BMET Recent Labs    08/15/18 0618 08/16/18 0556  NA 140 139  K 3.7 3.7  CL 105 105  CO2 28 24  GLUCOSE 96 100*  BUN 15 13  CREATININE 1.00 0.90  CALCIUM 8.4* 8.5*   No results for input(s): LABPT, INR in the last 72 hours. No results for input(s): LABURIN in the last 72 hours. Results for orders placed or performed during the hospital encounter of 08/14/18  Urine culture     Status: Abnormal (Preliminary result)   Collection Time: 08/14/18  5:29 PM  Result Value Ref Range Status   Specimen Description   Final    URINE, RANDOM Performed at [facility], [address], [place], [place] [address]    Special Requests   Final    NONE Performed at [facility], [address], [place], [place] [address]    Culture (A)  Final    >=100,000 COLONIES/mL STAPHYLOCOCCUS SPECIES (COAGULASE NEGATIVE)   Report Status PENDING  Incomplete  Blood Culture (routine x 2)     Status: None (Preliminary result)   Collection Time: 08/14/18  7:47 PM  Result Value Ref Range Status   Specimen Description   Final    BLOOD RIGHT HAND Performed at [facility], [address] [place], [place] [address]    Special Requests   Final    BOTTLES DRAWN AEROBIC AND ANAEROBIC Blood Culture adequate volume Performed at [facility], [address] [place], [place] [address]    Culture   Final    NO GROWTH 2 DAYS Performed at [facility], [address] [place], [place] [address]    Report Status PENDING  Incomplete  Blood Culture (routine x 2)     Status: None (Preliminary result)   Collection Time: 08/14/18  8:05 PM  Result Value Ref Range Status   Specimen Description   Final    BLOOD LEFT HAND Performed at [facility], [address] [place], [place] [address]    Special Requests   Final    BOTTLES DRAWN AEROBIC AND ANAEROBIC Blood Culture results may not be optimal due to an excessive volume of blood received in culture bottles Performed at [facility], [address], [place], [place] [address]    Culture   Final    NO GROWTH 2 DAYS Performed at [facility], [address] [place], [place] [address]    Report Status PENDING  Incomplete    Studies/Results: Ct Renal Stone Study  Result Date: 08/14/2018 CLINICAL DATA:  LEFT flank pain.  Recent  passage of a kidney stone. EXAM: CT ABDOMEN AND PELVIS WITHOUT CONTRAST TECHNIQUE: Multidetector CT imaging of the abdomen and pelvis was performed following the standard protocol without IV contrast. COMPARISON:  CT urogram 03/12/2018. FINDINGS: Lower chest: No acute abnormality. Hepatobiliary: Low attenuation of the liver consistent with steatosis. No focal abnormality. No gallstones, gallbladder wall thickening, or biliary ductal dilatation. Pancreas: Unremarkable. No pancreatic ductal dilatation or surrounding inflammatory changes. Spleen: Normal in size without focal abnormality. Adrenals/Urinary Tract: Normal adrenal glands. BILATERAL nephrolithiasis; the largest stones are dependent in the LEFT collecting system,  up to 5 mm in length. LEFT hydronephrosis and hydroureter without obstructing calculus. Normal bladder. Stomach/Bowel: Stomach is within normal limits. Appendix appears normal. No evidence of bowel wall thickening, distention, or inflammatory changes. Vascular/Lymphatic: No significant vascular findings are present. No enlarged abdominal or pelvic lymph nodes. Reproductive: Prostate is unremarkable. Other: Tiny umbilical hernia.  No abdominopelvic ascites. Musculoskeletal: Transitional lumbosacral anatomy. Degenerative scoliosis convex LEFT mid lumbar region. IMPRESSION: BILATERAL nephrolithiasis. LEFT hydronephrosis and hydroureter without obstructing calculus, likely reflecting recent passage of a stone. Hepatic steatosis. Electronically Signed   By: Elsie StainJohn T Curnes M.D.   On: 08/14/2018 20:58  I reviewed all the images and compared to prior CT images.   Assessment/Plan:  Sepsis - Agree with the addition of vancomycin.  Patient seems to be colonized with staph based on his last hospital in office culture.  Nephrolithiasis - mainly LLP stones remain   Hydronephrosis  - pt passed a "large stone" which is a good sign he doesn't have a significant left stricture. Will see back in office in a few weeks with a renal US. My office will call.     LOS: 1 day   Jerilee FieldMatthew Konni Kesinger 08/16/2018, 9:41 AM

## 2018-08-16 NOTE — Care Management Note (Signed)
Case Management Note  Patient Details  Name: Sheliah MendsRussell K Lowery MRN: [id number] Date of Birth: 05-01-1991  Subjective/Objective:                  Sepsis likely due to Acute pyelonephritis: Started empirically on IV Rocephin and IV vancomycin he has remained afebrile. More than 100,000 colonies of coag negative staph.  Blood cultures remain negative vitals stable. Surgery was consulted and recommended no surgical intervention.  GERD: Continue Protonix twice a day.  History of recurrent nephrolithiasis follow-up with [facility] as an outpatient.  Chronic migraine: Currently asymptomatic continue Keppra and Lamictal.  ADHD: Stable.  History of SVT status post ablation: No events on telemetry.  DVT prophylaxis: lovenxo Family Communication:mother Disposition Plan/Barrier to D/C: home in am  Action/Plan: Will follow for progression-completion of icv meds abnd transition to p.o. Will follow for cm needs-independent none present  Expected Discharge Date:  08/16/18               Expected Discharge Plan:  Assisted Living / Rest Home  In-House Referral:  Clinical Social Work  Discharge planning Services  CM Consult  Post Acute Care Choice:    Choice offered to:     DME Arranged:    DME Agency:     HH Arranged:    HH Agency:     Status of Service:  In process, will continue to follow  If discussed at Long Length of Stay Meetings, dates discussed:    Additional Comments:  Golda AcreDavis, Seraiah Nowack Lynn, RN 08/16/2018, 11:54 AM

## 2018-08-16 NOTE — Progress Notes (Signed)
[facility] PROGRESS NOTE    Progress Note  Thomas MendsRussell K Dilmore  WUJ:[id number] DOB: 02-21-1991 DOA: 08/14/2018 PCP: Merlyn AlbertLuking, William S, MD     Brief Narrative:   Thomas Howell is an 27 y.o. male past medical history multiple renal and ureteral stones with hydronephrosis patient followed by Dr. Mena GoesEskridge as an outpatient and call the office as he was having a fever.  Came into the ED was found to have a white count of 16 stone protocol was done that showed bilateral nephrolithiasis with some left hydronephrosis and hydroureters without obstruction  Assessment/Plan:   Sepsis likely due to Acute pyelonephritis: Started empirically on IV Rocephin and IV vancomycin he has remained afebrile. More than 100,000 colonies of coag negative staph.  Blood cultures remain negative vitals stable. Surgery was consulted and recommended no surgical intervention.  GERD: Continue Protonix twice a day.  History of recurrent nephrolithiasis follow-up with [facility] as an outpatient.  Chronic migraine: Currently asymptomatic continue Keppra and Lamictal.  ADHD: Stable.  History of SVT status post ablation: No events on telemetry.  DVT prophylaxis: lovenxo Family Communication:mother Disposition Plan/Barrier to D/C: home in am Code Status:     Code Status Orders  (From admission, onward)         Start     Ordered   08/14/18 2310  Full code  Continuous     08/14/18 2310        Code Status History    Date Active Date Inactive Code Status Order ID Comments User Context   05/22/2018 1138 05/22/2018 2202 Full Code [id number]  Marinus Mawaylor, Gregg W, MD Inpatient   08/18/2015 2355 08/19/2015 2015 Full Code [id number]  Penny PiaVega, [place], MD Inpatient   07/07/2015 0024 07/07/2015 1508 Full Code [id number]  [name], Matthew, MD Inpatient        IV Access:    Peripheral IV   Procedures and diagnostic studies:   Ct Renal Stone Study  Result Date: 08/14/2018 CLINICAL DATA:  LEFT flank  pain.  Recent passage of a kidney stone. EXAM: CT ABDOMEN AND PELVIS WITHOUT CONTRAST TECHNIQUE: Multidetector CT imaging of the abdomen and pelvis was performed following the standard protocol without IV contrast. COMPARISON:  CT urogram 03/12/2018. FINDINGS: Lower chest: No acute abnormality. Hepatobiliary: Low attenuation of the liver consistent with steatosis. No focal abnormality. No gallstones, gallbladder wall thickening, or biliary ductal dilatation. Pancreas: Unremarkable. No pancreatic ductal dilatation or surrounding inflammatory changes. Spleen: Normal in size without focal abnormality. Adrenals/Urinary Tract: Normal adrenal glands. BILATERAL nephrolithiasis; the largest stones are dependent in the LEFT collecting system, up to 5 mm in length. LEFT hydronephrosis and hydroureter without obstructing calculus. Normal bladder. Stomach/Bowel: Stomach is within normal limits. Appendix appears normal. No evidence of bowel wall thickening, distention, or inflammatory changes. Vascular/Lymphatic: No significant vascular findings are present. No enlarged abdominal or pelvic lymph nodes. Reproductive: Prostate is unremarkable. Other: Tiny umbilical hernia.  No abdominopelvic ascites. Musculoskeletal: Transitional lumbosacral anatomy. Degenerative scoliosis convex LEFT mid lumbar region. IMPRESSION: BILATERAL nephrolithiasis. LEFT hydronephrosis and hydroureter without obstructing calculus, likely reflecting recent passage of a stone. Hepatic steatosis. Electronically Signed   By: Elsie StainJohn T Curnes M.D.   On: 08/14/2018 20:58     Medical Consultants:    None.  Anti-Infectives:   *IV vancomycin and Rocephin.  Subjective:    Thomas MendsRussell K Campi lady feels much better compared to yesterday.  Objective:    Vitals:   08/15/18 1514 08/15/18 1632 08/15/18 1949 08/16/18 0505  BP: (!) 159/96 Marland Kitchen(!)  141/78 122/79 (!) 142/83  Pulse: (!) 108 (!) 108 88 97  Resp: 17 17 17 18   Temp: (!) 103.1 F (39.5 C) 99.4  F (37.4 C) 98.5 F (36.9 C) 99.6 F (37.6 C)  TempSrc: Oral Oral Oral Oral  SpO2: 98% 98% 100% 98%  Weight:      Height:        Intake/Output Summary (Last 24 hours) at 08/16/2018 1018 Last data filed at 08/16/2018 0600 Gross per 24 hour  Intake 2852.29 ml  Output -  Net 2852.29 ml   Filed Weights   08/14/18 1719 08/14/18 2007 08/15/18 0054  Weight: 104.3 kg 104.3 kg 117 kg    Exam: General exam: In no acute distress. Respiratory system: Good air movement and clear to auscultation. Cardiovascular system: S1 & S2 heard, RRR.  Gastrointestinal system: Abdomen is nondistended, soft and nontender.  Central nervous system: Alert and oriented. No focal neurological deficits. Extremities: No pedal edema. Skin: No rashes, lesions or ulcers Psychiatry: Judgement and insight appear normal. Mood & affect appropriate.    Data Reviewed:    Labs: Basic Metabolic Panel: Recent Labs  Lab 08/14/18 2000 08/15/18 0618 08/16/18 0556  NA 138 140 139  K 3.4* 3.7 3.7  CL 102 105 105  CO2 25 28 24   GLUCOSE 106* 96 100*  BUN 17 15 13   CREATININE 0.96 1.00 0.90  CALCIUM 9.2 8.4* 8.5*   GFR Estimated Creatinine Clearance: 156.9 mL/min (by C-G formula based on SCr of 0.9 mg/dL). Liver Function Tests: Recent Labs  Lab 08/14/18 2000  AST 33  ALT 46*  ALKPHOS 66  BILITOT 0.8  PROT 8.0  ALBUMIN 4.1   No results for input(s): LIPASE, AMYLASE in the last 168 hours. No results for input(s): AMMONIA in the last 168 hours. Coagulation profile No results for input(s): INR, PROTIME in the last 168 hours.  CBC: Recent Labs  Lab 08/14/18 2000 08/15/18 0618 08/16/18 0556  WBC 16.9* 16.9* 8.9  NEUTROABS 13.1*  --  6.2  HGB 15.0 13.5 12.8*  HCT 42.4 40.0 37.1*  MCV 87.1 89.7 87.9  PLT 197 175 158   Cardiac Enzymes: No results for input(s): CKTOTAL, CKMB, CKMBINDEX, TROPONINI in the last 168 hours. BNP (last 3 results) No results for input(s): PROBNP in the last 8760  hours. CBG: No results for input(s): GLUCAP in the last 168 hours. D-Dimer: No results for input(s): DDIMER in the last 72 hours. Hgb A1c: No results for input(s): HGBA1C in the last 72 hours. Lipid Profile: No results for input(s): CHOL, HDL, LDLCALC, TRIG, CHOLHDL, LDLDIRECT in the last 72 hours. Thyroid function studies: No results for input(s): TSH, T4TOTAL, T3FREE, THYROIDAB in the last 72 hours.  Invalid input(s): FREET3 Anemia work up: No results for input(s): VITAMINB12, FOLATE, FERRITIN, TIBC, IRON, RETICCTPCT in the last 72 hours. Sepsis Labs: Recent Labs  Lab 08/14/18 2000 08/14/18 2002 08/15/18 0132 08/15/18 0618 08/16/18 0556  PROCALCITON  --   --  0.50  --  1.59  WBC 16.9*  --   --  16.9* 8.9  LATICACIDVEN  --  0.90 1.7  --   --    Microbiology Recent Results (from the past 240 hour(s))  Urine culture     Status: Abnormal (Preliminary result)   Collection Time: 08/14/18  5:29 PM  Result Value Ref Range Status   Specimen Description   Final    URINE, RANDOM Performed at [facility], [address] [place], [place] [address]  Special Requests   Final    NONE Performed at [facility], [address] [place], [place] [address]    Culture (A)  Final    >=100,000 COLONIES/mL STAPHYLOCOCCUS SPECIES (COAGULASE NEGATIVE)   Report Status PENDING  Incomplete  Blood Culture (routine x 2)     Status: None (Preliminary result)   Collection Time: 08/14/18  7:47 PM  Result Value Ref Range Status   Specimen Description   Final    BLOOD RIGHT HAND Performed at [facility], [address] [place], [place] [address]    Special Requests   Final    BOTTLES DRAWN AEROBIC AND ANAEROBIC Blood Culture adequate volume Performed at [facility], [address], [place], [place] [address]    Culture   Final    NO GROWTH 2 DAYS Performed at [facility], [address], [place],  [place] [address]    Report Status PENDING  Incomplete  Blood Culture (routine x 2)     Status: None (Preliminary result)   Collection Time: 08/14/18  8:05 PM  Result Value Ref Range Status   Specimen Description   Final    BLOOD LEFT HAND Performed at [facility], [address], [place], [place] [address]    Special Requests   Final    BOTTLES DRAWN AEROBIC AND ANAEROBIC Blood Culture results may not be optimal due to an excessive volume of blood received in culture bottles Performed at [facility], [address] [place], [place] [address]    Culture   Final    NO GROWTH 2 DAYS Performed at [facility], [address] [place], [place] [address]    Report Status PENDING  Incomplete     Medications:   . enoxaparin (LOVENOX) injection  40 mg Subcutaneous QHS  . lamoTRIgine  100 mg Oral BID  . levETIRAcetam  1,500 mg Oral QHS  . levETIRAcetam  750 mg Oral Daily  . pantoprazole  40 mg Oral BID  . potassium chloride  15 mEq Oral BID   Continuous Infusions: . sodium chloride 125 mL/hr at 08/16/18 0525  . cefTRIAXone (ROCEPHIN)  IV 2 g (08/15/18 2023)  . vancomycin 1,250 mg (08/15/18 2243)      LOS: 1 day   Marinda Elk  [facility] Pager [phone]  *Please refer to [email or web address], password [email or web address] to get updated schedule on who will round on this patient, as hospitalists switch teams weekly. If 7PM-7AM, please contact night-coverage at [email or web address], password [email or web address] for any overnight needs.  08/16/2018, 10:18 AM

## 2018-08-17 DIAGNOSIS — N1330 Unspecified hydronephrosis: Secondary | ICD-10-CM

## 2018-08-17 DIAGNOSIS — F902 Attention-deficit hyperactivity disorder, combined type: Secondary | ICD-10-CM

## 2018-08-17 LAB — PROCALCITONIN: Procalcitonin: 1.03 ng/mL

## 2018-08-17 LAB — URINE CULTURE: Culture: >=100000 — AB

## 2018-08-17 MED ORDER — CLINDAMYCIN HCL 300 MG PO CAPS: 300.0000 mg | ORAL_CAPSULE | Freq: Three times a day (TID) | ORAL | 0 refills | Status: AC

## 2018-08-17 NOTE — Progress Notes (Signed)
Patient given discharge instructions, and verbalized an understanding of all discharge instructions.  Patient agrees with discharge plan, and is being discharged in stable medical condition.  

## 2018-08-17 NOTE — Care Management Note (Signed)
Case Management Note  Patient Details  Name: Thomas Howell MRN: [id number] Date of Birth: [birth year]  Subjective/Objective:                  Discharged to home-alf Action/Plan: Patient has already made appointment with urologist and prefers to make his pwn f/u appontment with his pcp-Thomas Howell.  No other needs present.  Expected Discharge Date:  08/17/18               Expected Discharge Plan:  Assisted Living / Rest Home  In-House Referral:  Clinical Social Work  Discharge planning Services  CM Consult  Post Acute Care Choice:    Choice offered to:     DME Arranged:    DME Agency:     HH Arranged:    HH Agency:     Status of Service:  Completed, signed off  If discussed at [employer] Meetings, dates discussed:    Additional Comments:  Golda AcreDavis, Rhonda Lynn, RN 08/17/2018, 10:18 AM

## 2018-08-17 NOTE — Discharge Summary (Signed)
Physician Discharge Summary  Thomas Howell:[id number] DOB: February 23, 1991 DOA: 08/14/2018  PCP: Merlyn Albert, MD  Admit date: 08/14/2018 Discharge date: 08/17/2018  Admitted From: home Disposition:  Home  Recommendations for Outpatient Follow-up:  1. Follow up with Urology in 1-2 weeks 2. Please obtain BMP/CBC in one week   Home Health:no Equipment/Devices:none  Discharge Condition:stable CODE STATUS:full Diet recommendation: Heart Healthy   Brief/Interim Summary: 27 y.o. male past medical history multiple renal and ureteral stones with hydronephrosis patient followed by Dr. Mena Goes as an outpatient and call the office as he was having a fever.  Came into the ED was found to have a white count of 16 stone protocol was done that showed bilateral nephrolithiasis with some left hydronephrosis and hydroureters without obstruction  Discharge Diagnoses:  Principal Problem:   Acute pyelonephritis Active Problems:   Nephrolithiasis   Chronic migraine   Attention deficit hyperactivity disorder (ADHD), combined type   SVT (supraventricular tachycardia) (HCC)   Bilateral hydronephrosis   Sepsis (HCC)  Sepsis likely due to acute pyelonephritis: He was admitted to the hospital started empirically on IV vancomycin and Rocephin urine culture grew staph coag negative sensitive to clindamycin which she would continue to take for an additional 10 days as an outpatient.  Obstructive uropathy: CT scan of the abdomen pelvis was done that showed hydronephrosis, urology was consulted and he passed a stone while in-house they recommended no further intervention.  GERD: Continue Protonix.  History of recurrent nephrolithiasis: We will follow-up with urology as an outpatient.  Chronic migraine: Currently asymptomatic continue Keppra and Lamictal.  ADHD: Continue current medications.  Discharge Instructions  Discharge Instructions    Diet - low sodium heart healthy   Complete by:   As directed    Increase activity slowly   Complete by:  As directed      Allergies as of 08/17/2018      Reactions   Bactrim [sulfamethoxazole-trimethoprim] Nausea And Vomiting   Codeine Swelling   Specifically cough syrup w/ codeine cause lip swelling   Adhesive [tape] Rash   Paper tape ok      Medication List    TAKE these medications   acetaminophen 500 MG tablet Commonly known as:  TYLENOL Take 500 mg by mouth every 4 (four) hours as needed for fever.   amphetamine-dextroamphetamine 10 MG tablet Commonly known as:  ADDERALL Take one po qam then one po 4 hours later   BOTOX 100 units Solr injection Generic drug:  botulinum toxin Type A INJECT IN THE MUSCLE TO HEAD AND NECK MUSCLES EVERY 3 MONTHS BY PROVIDER What changed:  See the new instructions.   butalbital-acetaminophen-caffeine 50-325-40 MG tablet Commonly known as:  FIORICET, ESGIC Take 1 tablet by mouth every 6 (six) hours as needed for headache.   clindamycin 300 MG capsule Commonly known as:  CLEOCIN Take 1 capsule (300 mg total) by mouth 3 (three) times daily for 10 days.   hydrochlorothiazide 25 MG tablet Commonly known as:  HYDRODIURIL Take 25 mg by mouth 2 (two) times daily.   lamoTRIgine 100 MG tablet Commonly known as:  LAMICTAL One po bid What changed:    how much to take  how to take this  when to take this  additional instructions   levETIRAcetam 750 MG tablet Commonly known as:  KEPPRA TAKE 1 TABLET BY MOUTH EVERY MORNING AND 2 TABLETS EVERY NIGHT AT BEDTIME   ondansetron 4 MG disintegrating tablet Commonly known as:  ZOFRAN-ODT Take 1 tablet (4  mg total) by mouth every 8 (eight) hours as needed for nausea or vomiting.   Potassium Citrate 15 MEQ (1620 MG) Tbcr Take 30 mEq by mouth 2 (two) times daily.   rizatriptan 10 MG disintegrating tablet Commonly known as:  MAXALT-MLT Take one tablet at onset of headache. May repeat once in 2 hours. Max of 2 tablets in 24 hours and 4  tablets per week      Follow-up Information    [name], Matthew, MD. Call.   Specialty:  Urology Why:  2-3 weeks  Contact information: [address] [place] [id number] [phone]          Allergies  Allergen Reactions  . Bactrim [Sulfamethoxazole-Trimethoprim] Nausea And Vomiting  . Codeine Swelling    Specifically cough syrup w/ codeine cause lip swelling  . Adhesive [Tape] Rash    Paper tape ok    Consultations:  Urology   Procedures/Studies: Ct Renal Stone Study  Result Date: 08/14/2018 CLINICAL DATA:  LEFT flank pain.  Recent passage of a kidney stone. EXAM: CT ABDOMEN AND PELVIS WITHOUT CONTRAST TECHNIQUE: Multidetector CT imaging of the abdomen and pelvis was performed following the standard protocol without IV contrast. COMPARISON:  CT urogram 03/12/2018. FINDINGS: Lower chest: No acute abnormality. Hepatobiliary: Low attenuation of the liver consistent with steatosis. No focal abnormality. No gallstones, gallbladder wall thickening, or biliary ductal dilatation. Pancreas: Unremarkable. No pancreatic ductal dilatation or surrounding inflammatory changes. Spleen: Normal in size without focal abnormality. Adrenals/Urinary Tract: Normal adrenal glands. BILATERAL nephrolithiasis; the largest stones are dependent in the LEFT collecting system, up to 5 mm in length. LEFT hydronephrosis and hydroureter without obstructing calculus. Normal bladder. Stomach/Bowel: Stomach is within normal limits. Appendix appears normal. No evidence of bowel wall thickening, distention, or inflammatory changes. Vascular/Lymphatic: No significant vascular findings are present. No enlarged abdominal or pelvic lymph nodes. Reproductive: Prostate is unremarkable. Other: Tiny umbilical hernia.  No abdominopelvic ascites. Musculoskeletal: Transitional lumbosacral anatomy. Degenerative scoliosis convex LEFT mid lumbar region. IMPRESSION: BILATERAL nephrolithiasis. LEFT hydronephrosis and hydroureter  without obstructing calculus, likely reflecting recent passage of a stone. Hepatic steatosis. Electronically Signed   By: Elsie StainJohn T Curnes M.D.   On: 08/14/2018 20:58      Subjective: No complains  Discharge Exam: Vitals:   08/16/18 2038 08/17/18 0525  BP: 136/81 (!) 133/96  Pulse: 83 (!) 56  Resp: (!) 21 20  Temp: 97.7 F (36.5 C) 98.1 F (36.7 C)  SpO2: 98% 100%   Vitals:   08/16/18 1124 08/16/18 1415 08/16/18 2038 08/17/18 0525  BP: 134/86 122/82 136/81 (!) 133/96  Pulse: 83 79 83 (!) 56  Resp: 18 18 (!) 21 20  Temp: 97.8 F (36.6 C) 97.7 F (36.5 C) 97.7 F (36.5 C) 98.1 F (36.7 C)  TempSrc: Oral Oral Oral Oral  SpO2: 100% 97% 98% 100%  Weight:      Height:        General: Pt is alert, awake, not in acute distress Cardiovascular: RRR, S1/S2 +, no rubs, no gallops Respiratory: CTA bilaterally, no wheezing, no rhonchi Abdominal: Soft, NT, ND, bowel sounds + Extremities: no edema, no cyanosis    The results of significant diagnostics from this hospitalization (including imaging, microbiology, ancillary and laboratory) are listed below for reference.     Microbiology: Recent Results (from the past 240 hour(s))  Urine culture     Status: Abnormal   Collection Time: 08/14/18  5:29 PM  Result Value Ref Range Status   Specimen Description  Final    URINE, RANDOM Performed at [facility], [address], [place], [place] [address]    Special Requests   Final    NONE Performed at [facility], [address] [place], [place] [address]    Culture (A)  Final    >=100,000 COLONIES/mL STAPHYLOCOCCUS SPECIES (COAGULASE NEGATIVE)   Report Status 08/17/2018 FINAL  Final   Organism ID, Bacteria STAPHYLOCOCCUS SPECIES (COAGULASE NEGATIVE) (A)  Final      Susceptibility   Staphylococcus species (coagulase negative) - MIC*    CIPROFLOXACIN <=0.5 SENSITIVE Sensitive     GENTAMICIN <=0.5 SENSITIVE Sensitive     NITROFURANTOIN <=16  SENSITIVE Sensitive     OXACILLIN >=4 RESISTANT Resistant     TETRACYCLINE >=16 RESISTANT Resistant     VANCOMYCIN 1 SENSITIVE Sensitive     TRIMETH/SULFA <=10 SENSITIVE Sensitive     CLINDAMYCIN <=0.25 SENSITIVE Sensitive     RIFAMPIN <=0.5 SENSITIVE Sensitive     Inducible Clindamycin NEGATIVE Sensitive     * >=100,000 COLONIES/mL STAPHYLOCOCCUS SPECIES (COAGULASE NEGATIVE)  Blood Culture (routine x 2)     Status: None (Preliminary result)   Collection Time: 08/14/18  7:47 PM  Result Value Ref Range Status   Specimen Description   Final    BLOOD RIGHT HAND Performed at [facility], [address] [place], [place] [address]    Special Requests   Final    BOTTLES DRAWN AEROBIC AND ANAEROBIC Blood Culture adequate volume Performed at [facility], [address], [place], [place] [address]    Culture   Final    NO GROWTH 3 DAYS Performed at [facility], [address], [place], [place] [address]    Report Status PENDING  Incomplete  Blood Culture (routine x 2)     Status: None (Preliminary result)   Collection Time: 08/14/18  8:05 PM  Result Value Ref Range Status   Specimen Description   Final    BLOOD LEFT HAND Performed at [facility] [address] [place], [place] [address]    Special Requests   Final    BOTTLES DRAWN AEROBIC AND ANAEROBIC Blood Culture results may not be optimal due to an excessive volume of blood received in culture bottles Performed at [facility], [address], [place], [place] [address]    Culture   Final    NO GROWTH 3 DAYS Performed at [facility], [address], [place], [place] [address]    Report Status PENDING  Incomplete     Labs: BNP (last 3 results) No results for input(s): BNP in the last 8760 hours. Basic Metabolic Panel: Recent Labs  Lab 08/14/18 2000 08/15/18 0618 08/16/18 0556  NA 138 140 139  K 3.4* 3.7 3.7  CL 102 105 105   CO2 25 28 24   GLUCOSE 106* 96 100*  BUN 17 15 13   CREATININE 0.96 1.00 0.90  CALCIUM 9.2 8.4* 8.5*   Liver Function Tests: Recent Labs  Lab 08/14/18 2000  AST 33  ALT 46*  ALKPHOS 66  BILITOT 0.8  PROT 8.0  ALBUMIN 4.1   No results for input(s): LIPASE, AMYLASE in the last 168 hours. No results for input(s): AMMONIA in the last 168 hours. CBC: Recent Labs  Lab 08/14/18 2000 08/15/18 0618 08/16/18 0556  WBC 16.9* 16.9* 8.9  NEUTROABS 13.1*  --  6.2  HGB 15.0 13.5 12.8*  HCT 42.4 40.0  37.1*  MCV 87.1 89.7 87.9  PLT 197 175 158   Cardiac Enzymes: No results for input(s): CKTOTAL, CKMB, CKMBINDEX, TROPONINI in the last 168 hours. BNP: Invalid input(s): POCBNP CBG: No results for input(s): GLUCAP in the last 168 hours. D-Dimer No results for input(s): DDIMER in the last 72 hours. Hgb A1c No results for input(s): HGBA1C in the last 72 hours. Lipid Profile No results for input(s): CHOL, HDL, LDLCALC, TRIG, CHOLHDL, LDLDIRECT in the last 72 hours. Thyroid function studies No results for input(s): TSH, T4TOTAL, T3FREE, THYROIDAB in the last 72 hours.  Invalid input(s): FREET3 Anemia work up No results for input(s): VITAMINB12, FOLATE, FERRITIN, TIBC, IRON, RETICCTPCT in the last 72 hours. Urinalysis    Component Value Date/Time   COLORURINE YELLOW 08/14/2018 1729   APPEARANCEUR CLEAR 08/14/2018 1729   LABSPEC 1.019 08/14/2018 1729   PHURINE 6.0 08/14/2018 1729   GLUCOSEU NEGATIVE 08/14/2018 1729   HGBUR SMALL (A) 08/14/2018 1729   BILIRUBINUR NEGATIVE 08/14/2018 1729   KETONESUR NEGATIVE 08/14/2018 1729   PROTEINUR 30 (A) 08/14/2018 1729   UROBILINOGEN 0.2 08/18/2015 1900   NITRITE NEGATIVE 08/14/2018 1729   LEUKOCYTESUR LARGE (A) 08/14/2018 1729   Sepsis Labs Invalid input(s): PROCALCITONIN,  WBC,  LACTICIDVEN Microbiology Recent Results (from the past 240 hour(s))  Urine culture     Status: Abnormal   Collection Time: 08/14/18  5:29 PM  Result Value  Ref Range Status   Specimen Description   Final    URINE, RANDOM Performed at [facility], [address] [place], [place] [address]    Special Requests   Final    NONE Performed at [facility], [address], [place], [place] [address]    Culture (A)  Final    >=100,000 COLONIES/mL STAPHYLOCOCCUS SPECIES (COAGULASE NEGATIVE)   Report Status 08/17/2018 FINAL  Final   Organism ID, Bacteria STAPHYLOCOCCUS SPECIES (COAGULASE NEGATIVE) (A)  Final      Susceptibility   Staphylococcus species (coagulase negative) - MIC*    CIPROFLOXACIN <=0.5 SENSITIVE Sensitive     GENTAMICIN <=0.5 SENSITIVE Sensitive     NITROFURANTOIN <=16 SENSITIVE Sensitive     OXACILLIN >=4 RESISTANT Resistant     TETRACYCLINE >=16 RESISTANT Resistant     VANCOMYCIN 1 SENSITIVE Sensitive     TRIMETH/SULFA <=10 SENSITIVE Sensitive     CLINDAMYCIN <=0.25 SENSITIVE Sensitive     RIFAMPIN <=0.5 SENSITIVE Sensitive     Inducible Clindamycin NEGATIVE Sensitive     * >=100,000 COLONIES/mL STAPHYLOCOCCUS SPECIES (COAGULASE NEGATIVE)  Blood Culture (routine x 2)     Status: None (Preliminary result)   Collection Time: 08/14/18  7:47 PM  Result Value Ref Range Status   Specimen Description   Final    BLOOD RIGHT HAND Performed at [facility], [address], [place], [place] [address]    Special Requests   Final    BOTTLES DRAWN AEROBIC AND ANAEROBIC Blood Culture adequate volume Performed at [facility], [address], [place], [place] [address]    Culture   Final    NO GROWTH 3 DAYS Performed at [facility], [address], [place], [place] [address]    Report Status PENDING  Incomplete  Blood Culture (routine x 2)     Status: None (Preliminary result)   Collection Time: 08/14/18  8:05 PM  Result Value Ref Range Status   Specimen Description   Final    BLOOD LEFT HAND Performed  at [facility], [address] [place], [place] [address]    Special Requests   Final    BOTTLES DRAWN AEROBIC AND ANAEROBIC Blood Culture results may not be optimal due to an excessive volume of blood received in culture bottles Performed at [facility], [address], [place], [place] [address]    Culture   Final    NO GROWTH 3 DAYS Performed at [facility], [address], [place], [place] [address]    Report Status PENDING  Incomplete     Time coordinating discharge: 35 minutes  SIGNED:   Marinda Elk, MD  [facility] 08/17/2018, 10:12 AM Pager   If 7PM-7AM, please contact night-coverage [email or web address] Password [email or web address]

## 2018-08-19 LAB — CULTURE, BLOOD (ROUTINE X 2)
Culture: NO GROWTH
Culture: NO GROWTH
Special Requests: ADEQUATE

## 2018-09-13 DIAGNOSIS — N202 Calculus of kidney with calculus of ureter: Secondary | ICD-10-CM | POA: Diagnosis not present

## 2018-09-18 IMAGING — CT CT RENAL STONE PROTOCOL
2 of 4 series · 16 of 46 positions shown, 18 images · non-contrast
Comparison: None.

CLINICAL DATA: LEFT flank pain since yesterday, vomiting, history
of kidney stones

EXAM:
CT ABDOMEN AND PELVIS WITHOUT CONTRAST
TECHNIQUE: Multidetector CT imaging of the abdomen and pelvis was performed
following the standard protocol without IV contrast. Sagittal and
coronal MPR images reconstructed from axial data set. Oral contrast
not administered for this indication.

[Series 2: axial st · axial · 0.86mm/px · z∈[-458,-48]mm · 13 of 94 slices shown, 15 images]
[im 6/94  soft-tissue]
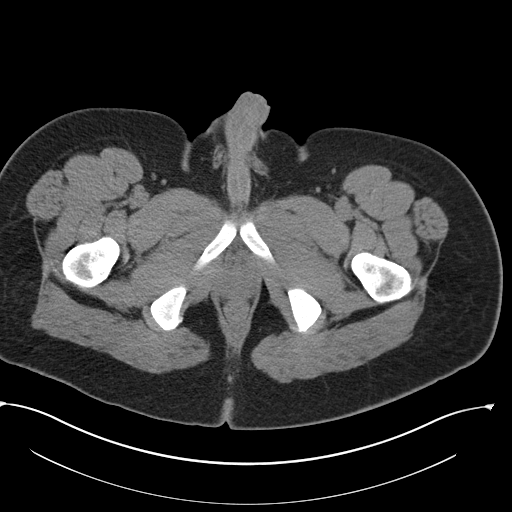
[im 6/94  bone]
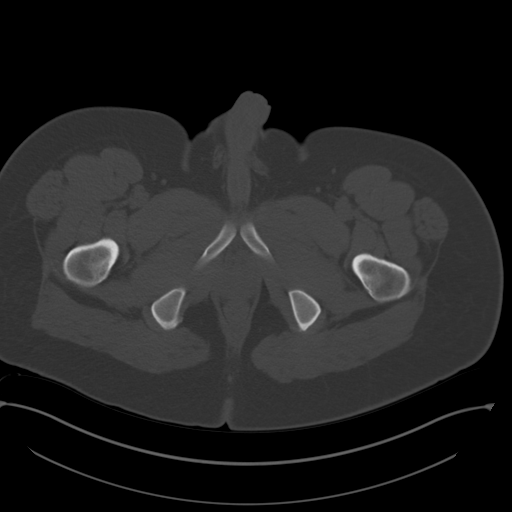
[im 12/94  soft-tissue]
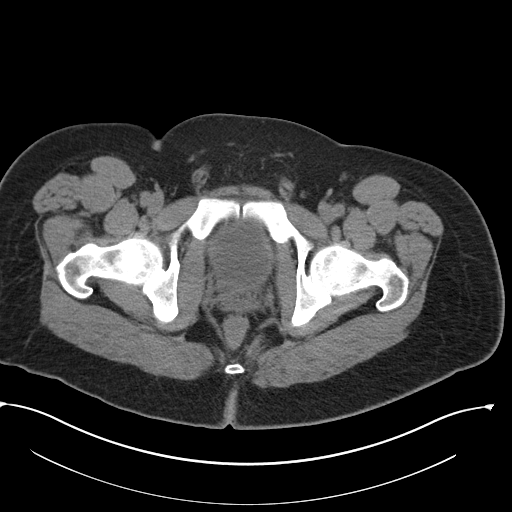
[im 18/94  soft-tissue]
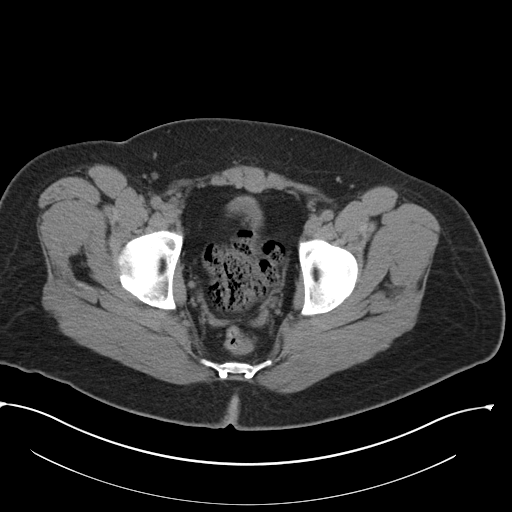
[im 30/94  soft-tissue]
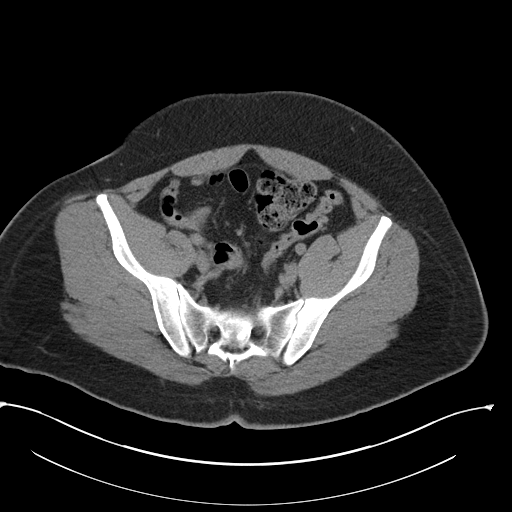
[im 35/94  soft-tissue]
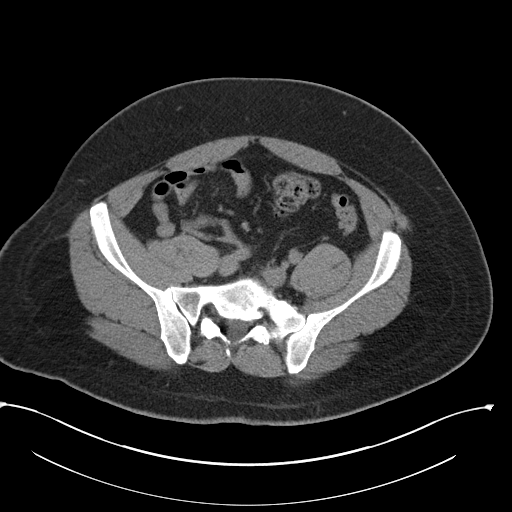
[im 41/94  soft-tissue]
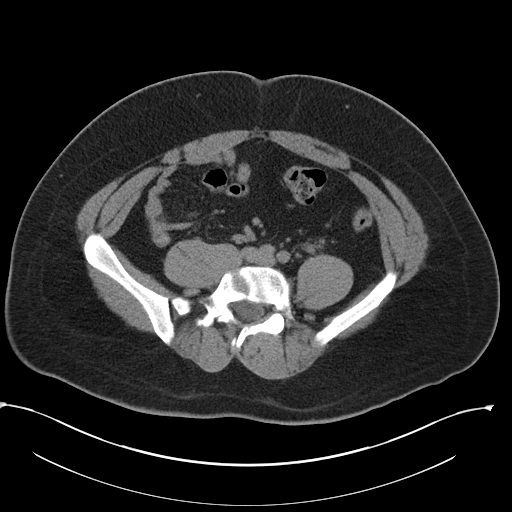
[im 47/94  soft-tissue]
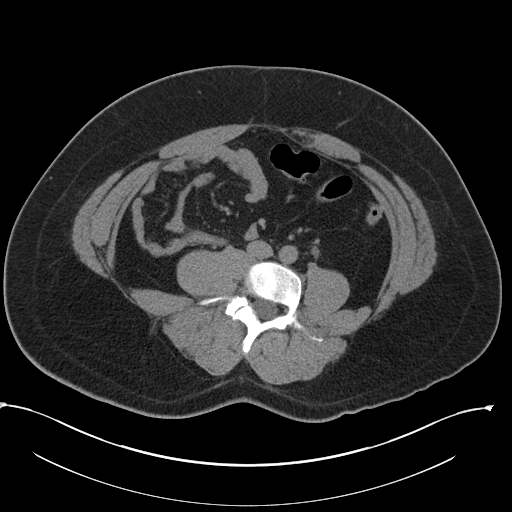
[im 53/94  soft-tissue]
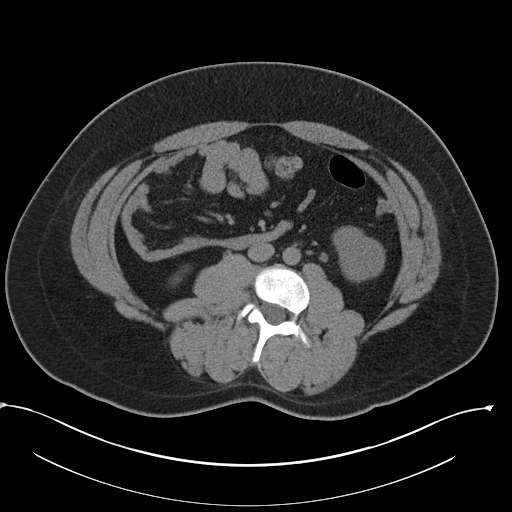
[im 59/94  soft-tissue]
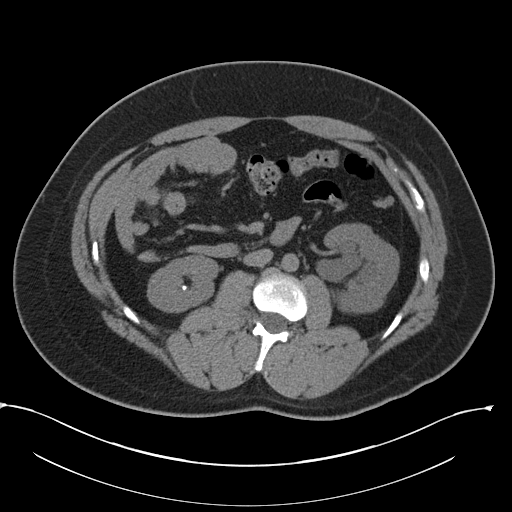
[im 59/94  bone]
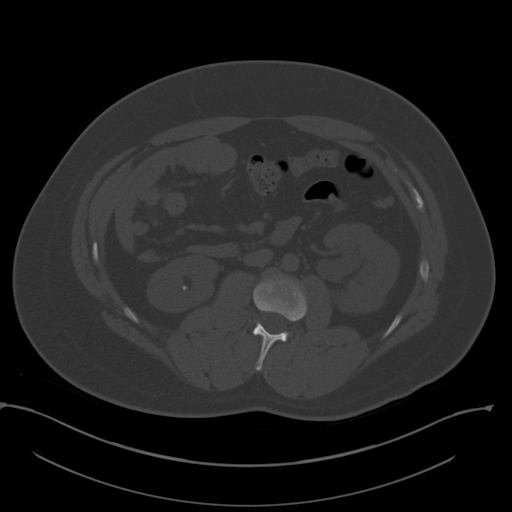
[im 64/94  soft-tissue]
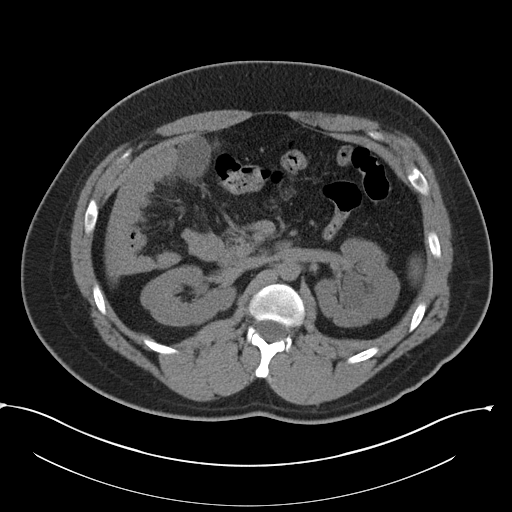
[im 76/94  soft-tissue]
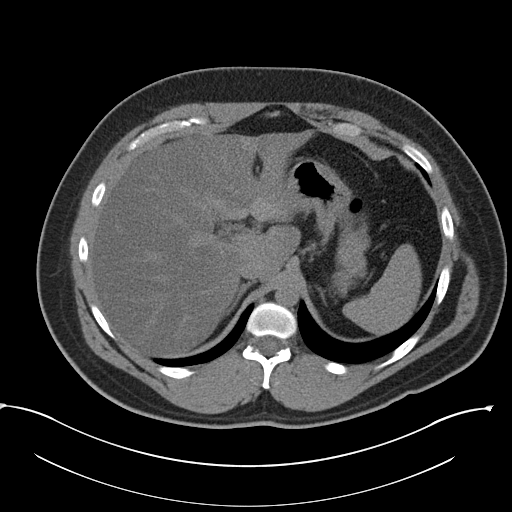
[im 82/94  soft-tissue]
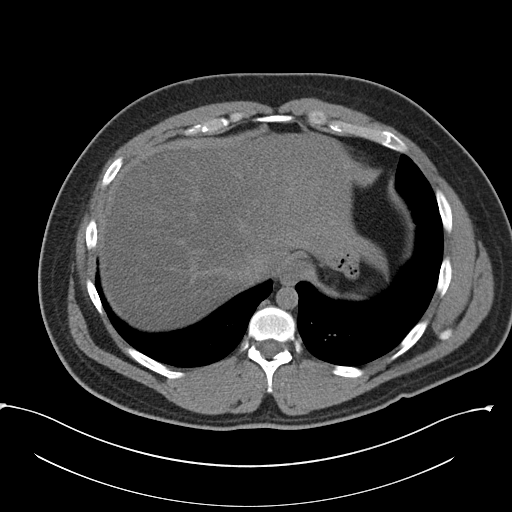
[im 88/94  soft-tissue]
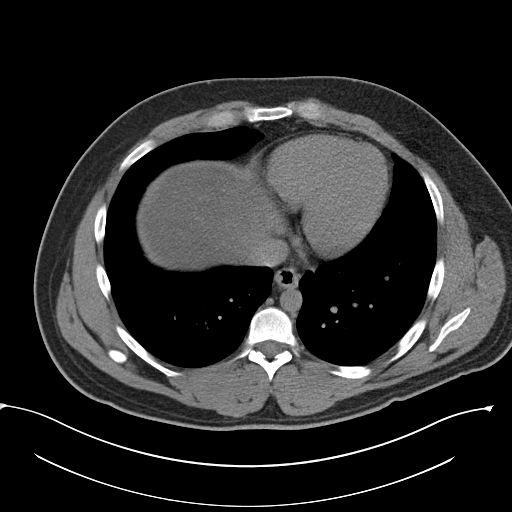

[Series 5: coronal · coronal · 0.87mm/px · 3 of 160 slices shown]
[im 54/160  soft-tissue]
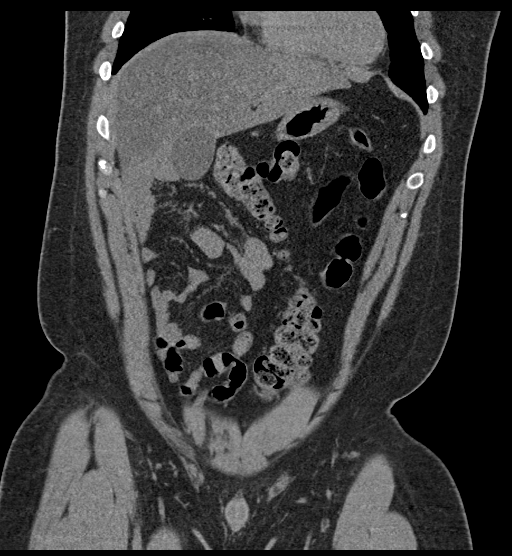
[im 71/160  soft-tissue]
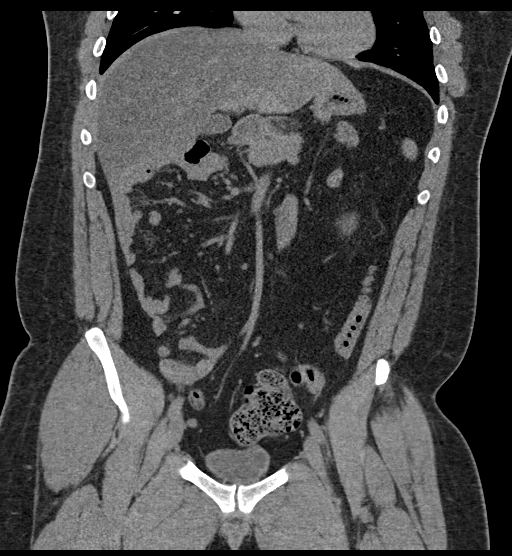
[im 89/160  soft-tissue]
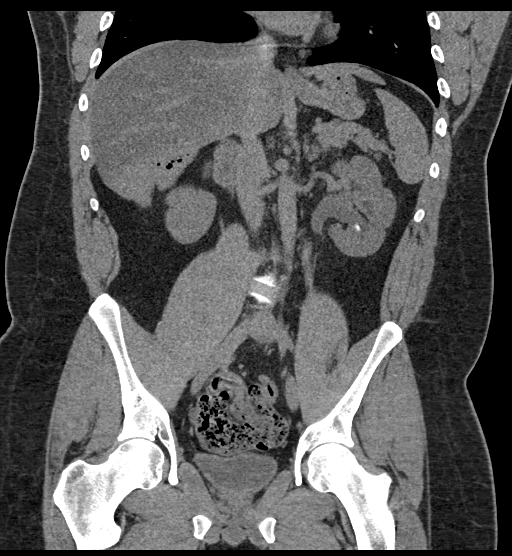

[16 of 46 positions shown; findings below may reference images not displayed]

FINDINGS: Lower chest: Lung bases clear

Hepatobiliary: Fatty infiltration of liver. Focal sparing adjacent
to gallbladder fossa. No additional abnormalities of the gallbladder
or liver.

Pancreas: Normal appearance

Spleen: Normal appearance

Adrenals/Urinary Tract: Adrenal glands normal appearance. Multiple
BILATERAL nonobstructing renal calculi. In addition, LEFT
hydronephrosis and proximal hydroureter secondary to a 4 mm diameter
10 mm length proximal LEFT ureteral calculus image 51. Additional
tiny LEFT ureteral calculus proximal to the larger stone. No RIGHT
hydronephrosis. Ureters and bladder otherwise unremarkable.

Stomach/Bowel: Medial position of the cecum and ascending colon
again identified. Low lying cecum with normal appendix in RIGHT
pelvis. Stomach and bowel loops otherwise normal appearance.

Vascular/Lymphatic: Unremarkable

Reproductive: Normal appearance

Other: No free air, free fluid or inflammatory process

Musculoskeletal: Rotary levoconvex scoliosis of the lumbar spine. No
acute osseous abnormalities.
IMPRESSION: LEFT hydronephrosis and proximal hydroureter secondary to a 4 x 10
mm proximal LEFT ureteral calculus with an additional tiny LEFT
ureteral calculus just above the larger stone.

Additional BILATERAL nonobstructing renal calculi.

Diffuse fatty infiltration of liver with focal sparing adjacent to
gallbladder fossa.

Scoliosis.

Additional

## 2018-10-12 ENCOUNTER — Encounter (HOSPITAL_COMMUNITY): Payer: Self-pay

## 2018-10-12 ENCOUNTER — Other Ambulatory Visit: Payer: Self-pay | Admitting: Neurology

## 2018-10-12 ENCOUNTER — Emergency Department (HOSPITAL_COMMUNITY)
Admission: EM | Admit: 2018-10-12 | Discharge: 2018-10-13 | Disposition: A | Discharge status: Home / Self Care | Payer: BLUE CROSS/BLUE SHIELD | Source: Home / Self Care | Attending: Emergency Medicine | Admitting: Emergency Medicine

## 2018-10-12 DIAGNOSIS — N202 Calculus of kidney with calculus of ureter: Secondary | ICD-10-CM | POA: Insufficient documentation

## 2018-10-12 DIAGNOSIS — N200 Calculus of kidney: Secondary | ICD-10-CM

## 2018-10-12 DIAGNOSIS — N201 Calculus of ureter: Secondary | ICD-10-CM | POA: Diagnosis not present

## 2018-10-12 DIAGNOSIS — Z79899 Other long term (current) drug therapy: Secondary | ICD-10-CM

## 2018-10-12 DIAGNOSIS — Z87891 Personal history of nicotine dependence: Secondary | ICD-10-CM

## 2018-10-12 NOTE — ED Triage Notes (Signed)
Hx of multiple kidney stones on the left usually, tonight with pain to the right flank with vomiting.

## 2018-10-13 ENCOUNTER — Encounter (HOSPITAL_COMMUNITY): Payer: Self-pay | Admitting: *Deleted

## 2018-10-13 ENCOUNTER — Other Ambulatory Visit: Payer: Self-pay | Admitting: Urology

## 2018-10-13 ENCOUNTER — Ambulatory Visit (HOSPITAL_COMMUNITY): Payer: BLUE CROSS/BLUE SHIELD

## 2018-10-13 ENCOUNTER — Other Ambulatory Visit: Payer: Self-pay

## 2018-10-13 ENCOUNTER — Ambulatory Visit (HOSPITAL_COMMUNITY): Payer: BLUE CROSS/BLUE SHIELD | Admitting: Certified Registered Nurse Anesthetist

## 2018-10-13 ENCOUNTER — Ambulatory Visit (HOSPITAL_COMMUNITY)
Admission: AD | Admit: 2018-10-13 | Discharge: 2018-10-13 | Disposition: A | Discharge status: Home / Self Care | Payer: BLUE CROSS/BLUE SHIELD | Source: Ambulatory Visit | Attending: Urology | Admitting: Urology

## 2018-10-13 ENCOUNTER — Encounter (HOSPITAL_COMMUNITY)
Admission: AD | Disposition: A | Discharge status: Home / Self Care | Payer: Self-pay | Source: Ambulatory Visit | Attending: Urology

## 2018-10-13 ENCOUNTER — Emergency Department (HOSPITAL_COMMUNITY): Payer: BLUE CROSS/BLUE SHIELD

## 2018-10-13 DIAGNOSIS — N202 Calculus of kidney with calculus of ureter: Secondary | ICD-10-CM | POA: Diagnosis not present

## 2018-10-13 DIAGNOSIS — N200 Calculus of kidney: Secondary | ICD-10-CM | POA: Diagnosis not present

## 2018-10-13 DIAGNOSIS — Z87891 Personal history of nicotine dependence: Secondary | ICD-10-CM | POA: Insufficient documentation

## 2018-10-13 DIAGNOSIS — N201 Calculus of ureter: Secondary | ICD-10-CM | POA: Insufficient documentation

## 2018-10-13 HISTORY — PX: CYSTOSCOPY/URETEROSCOPY/HOLMIUM LASER/STENT PLACEMENT: SHX6546

## 2018-10-13 LAB — URINALYSIS, ROUTINE W REFLEX MICROSCOPIC
Bacteria, UA: NONE SEEN
Bilirubin Urine: NEGATIVE
Glucose, UA: NEGATIVE mg/dL
Ketones, ur: NEGATIVE mg/dL
Leukocytes, UA: NEGATIVE
Nitrite: NEGATIVE
Protein, ur: 100 mg/dL — AB
RBC / HPF: >50 RBC/hpf — ABNORMAL HIGH (ref 0–5)
Specific Gravity, Urine: 1.009 (ref 1.005–1.030)
WBC Clumps: NONE SEEN
WBC, UA: >50 WBC/hpf — ABNORMAL HIGH (ref 0–5)
pH: 6 (ref 5.0–8.0)

## 2018-10-13 SURGERY — CYSTOSCOPY/URETEROSCOPY/HOLMIUM LASER/STENT PLACEMENT
Anesthesia: General

## 2018-10-13 MED ORDER — PHENAZOPYRIDINE HCL 200 MG PO TABS
ORAL_TABLET | ORAL | Status: AC
  Filled 2018-10-13: qty 1

## 2018-10-13 MED ORDER — ACETAMINOPHEN 650 MG RE SUPP
650.0000 mg | RECTAL | Status: DC | PRN
  Filled 2018-10-13: qty 1

## 2018-10-13 MED ORDER — FENTANYL CITRATE (PF) 100 MCG/2ML IJ SOLN: 25.0000 ug | INTRAMUSCULAR | Status: DC | PRN

## 2018-10-13 MED ORDER — LIDOCAINE HCL URETHRAL/MUCOSAL 2 % EX GEL
CUTANEOUS | Status: AC
  Filled 2018-10-13: qty 5

## 2018-10-13 MED ORDER — PROPOFOL 10 MG/ML IV BOLUS
INTRAVENOUS | Status: AC
  Filled 2018-10-13: qty 20

## 2018-10-13 MED ORDER — FENTANYL CITRATE (PF) 250 MCG/5ML IJ SOLN
INTRAMUSCULAR | Status: DC | PRN
  Administered 2018-10-13: 100 ug via INTRAVENOUS

## 2018-10-13 MED ORDER — HYDROMORPHONE HCL 1 MG/ML IJ SOLN
1.0000 mg | Freq: Once | INTRAMUSCULAR | Status: AC
  Administered 2018-10-13: 1 mg via INTRAVENOUS
  Filled 2018-10-13: qty 1

## 2018-10-13 MED ORDER — ONDANSETRON 8 MG PO TBDP: 8.0000 mg | ORAL_TABLET | Freq: Three times a day (TID) | ORAL | 0 refills | Status: DC | PRN

## 2018-10-13 MED ORDER — SUGAMMADEX SODIUM 200 MG/2ML IV SOLN
INTRAVENOUS | Status: DC | PRN
  Administered 2018-10-13: 400 mg via INTRAVENOUS

## 2018-10-13 MED ORDER — FENTANYL CITRATE (PF) 100 MCG/2ML IJ SOLN
50.0000 ug | Freq: Once | INTRAMUSCULAR | Status: AC
  Administered 2018-10-13: 50 ug via INTRAVENOUS
  Filled 2018-10-13: qty 2

## 2018-10-13 MED ORDER — OXYCODONE-ACETAMINOPHEN 5-325 MG PO TABS: 1.0000 | ORAL_TABLET | Freq: Three times a day (TID) | ORAL | 0 refills | Status: DC | PRN

## 2018-10-13 MED ORDER — BELLADONNA ALKALOIDS-OPIUM 16.2-30 MG RE SUPP
RECTAL | Status: AC
  Filled 2018-10-13: qty 1

## 2018-10-13 MED ORDER — FENTANYL CITRATE (PF) 100 MCG/2ML IJ SOLN
INTRAMUSCULAR | Status: AC
  Filled 2018-10-13: qty 2

## 2018-10-13 MED ORDER — HYOSCYAMINE SULFATE 0.125 MG SL SUBL
SUBLINGUAL_TABLET | SUBLINGUAL | Status: AC
  Filled 2018-10-13: qty 1

## 2018-10-13 MED ORDER — HYDROMORPHONE HCL 1 MG/ML IJ SOLN
1.0000 mg | Freq: Once | INTRAMUSCULAR | Status: AC
  Administered 2018-10-13: 1 mg via INTRAVENOUS

## 2018-10-13 MED ORDER — DEXAMETHASONE SODIUM PHOSPHATE 10 MG/ML IJ SOLN
INTRAMUSCULAR | Status: AC
  Filled 2018-10-13: qty 1

## 2018-10-13 MED ORDER — SODIUM CHLORIDE 0.9 % IV SOLN: 250.0000 mL | INTRAVENOUS | Status: DC | PRN

## 2018-10-13 MED ORDER — MIDAZOLAM HCL 5 MG/5ML IJ SOLN
INTRAMUSCULAR | Status: DC | PRN
  Administered 2018-10-13: 2 mg via INTRAVENOUS

## 2018-10-13 MED ORDER — ACETAMINOPHEN 325 MG PO TABS: 650.0000 mg | ORAL_TABLET | ORAL | Status: DC | PRN

## 2018-10-13 MED ORDER — MIDAZOLAM HCL 2 MG/2ML IJ SOLN
INTRAMUSCULAR | Status: AC
  Filled 2018-10-13: qty 2

## 2018-10-13 MED ORDER — SODIUM CHLORIDE 0.9 % IR SOLN
Status: DC | PRN
  Administered 2018-10-13: 3000 mL

## 2018-10-13 MED ORDER — HYDROMORPHONE HCL 1 MG/ML IJ SOLN
INTRAMUSCULAR | Status: AC
  Filled 2018-10-13: qty 1

## 2018-10-13 MED ORDER — OXYCODONE HCL 5 MG PO TABS: 5.0000 mg | ORAL_TABLET | ORAL | Status: DC | PRN

## 2018-10-13 MED ORDER — LIDOCAINE 2% (20 MG/ML) 5 ML SYRINGE
INTRAMUSCULAR | Status: AC
  Filled 2018-10-13: qty 5

## 2018-10-13 MED ORDER — LIDOCAINE HCL URETHRAL/MUCOSAL 2 % EX GEL
CUTANEOUS | Status: DC | PRN
  Administered 2018-10-13: 1 via URETHRAL

## 2018-10-13 MED ORDER — SUCCINYLCHOLINE CHLORIDE 200 MG/10ML IV SOSY
PREFILLED_SYRINGE | INTRAVENOUS | Status: DC | PRN
  Administered 2018-10-13: 180 mg via INTRAVENOUS

## 2018-10-13 MED ORDER — OXYCODONE-ACETAMINOPHEN 5-325 MG PO TABS: 1.0000 | ORAL_TABLET | Freq: Four times a day (QID) | ORAL | 0 refills | Status: DC | PRN

## 2018-10-13 MED ORDER — ONDANSETRON HCL 4 MG/2ML IJ SOLN
INTRAMUSCULAR | Status: AC
  Filled 2018-10-13: qty 2

## 2018-10-13 MED ORDER — CEFAZOLIN SODIUM-DEXTROSE 2-4 GM/100ML-% IV SOLN
2.0000 g | Freq: Once | INTRAVENOUS | Status: AC
  Administered 2018-10-13: 2 g via INTRAVENOUS
  Filled 2018-10-13: qty 100

## 2018-10-13 MED ORDER — SODIUM CHLORIDE 0.9% FLUSH: 3.0000 mL | INTRAVENOUS | Status: DC | PRN

## 2018-10-13 MED ORDER — OXYCODONE HCL 5 MG PO TABS
ORAL_TABLET | ORAL | Status: AC
  Administered 2018-10-13: 5 mg
  Filled 2018-10-13: qty 1

## 2018-10-13 MED ORDER — ONDANSETRON HCL 4 MG/2ML IJ SOLN
4.0000 mg | Freq: Once | INTRAMUSCULAR | Status: AC
  Administered 2018-10-13: 4 mg via INTRAVENOUS
  Filled 2018-10-13: qty 2

## 2018-10-13 MED ORDER — KETOROLAC TROMETHAMINE 30 MG/ML IJ SOLN
30.0000 mg | Freq: Once | INTRAMUSCULAR | Status: AC
  Administered 2018-10-13: 30 mg via INTRAVENOUS
  Filled 2018-10-13: qty 1

## 2018-10-13 MED ORDER — PROPOFOL 10 MG/ML IV BOLUS
INTRAVENOUS | Status: DC | PRN
  Administered 2018-10-13: 150 mg via INTRAVENOUS

## 2018-10-13 MED ORDER — LACTATED RINGERS IV SOLN
Freq: Once | INTRAVENOUS | Status: AC
  Administered 2018-10-13: 13:00:00 via INTRAVENOUS

## 2018-10-13 MED ORDER — SCOPOLAMINE 1 MG/3DAYS TD PT72
1.0000 | MEDICATED_PATCH | TRANSDERMAL | Status: DC
  Administered 2018-10-13: 1.5 mg via TRANSDERMAL
  Filled 2018-10-13: qty 1

## 2018-10-13 MED ORDER — LIDOCAINE 2% (20 MG/ML) 5 ML SYRINGE
INTRAMUSCULAR | Status: DC | PRN
  Administered 2018-10-13: 60 mg via INTRAVENOUS

## 2018-10-13 MED ORDER — LACTATED RINGERS IV SOLN
INTRAVENOUS | Status: DC | PRN
  Administered 2018-10-13 (×2): via INTRAVENOUS

## 2018-10-13 MED ORDER — PHENAZOPYRIDINE HCL 200 MG PO TABS
200.0000 mg | ORAL_TABLET | Freq: Once | ORAL | Status: AC
  Administered 2018-10-13: 200 mg via ORAL

## 2018-10-13 MED ORDER — PHENYLEPHRINE 40 MCG/ML (10ML) SYRINGE FOR IV PUSH (FOR BLOOD PRESSURE SUPPORT)
PREFILLED_SYRINGE | INTRAVENOUS | Status: DC | PRN
  Administered 2018-10-13 (×6): 80 ug via INTRAVENOUS

## 2018-10-13 MED ORDER — HYOSCYAMINE SULFATE 0.125 MG SL SUBL: 0.1250 mg | SUBLINGUAL_TABLET | Freq: Once | SUBLINGUAL | Status: DC

## 2018-10-13 MED ORDER — SODIUM CHLORIDE 0.9% FLUSH: 3.0000 mL | Freq: Two times a day (BID) | INTRAVENOUS | Status: DC

## 2018-10-13 MED ORDER — SODIUM CHLORIDE 0.9 % IV SOLN
INTRAVENOUS | Status: DC | PRN
  Administered 2018-10-13: 25 mL

## 2018-10-13 MED ORDER — ROCURONIUM BROMIDE 10 MG/ML (PF) SYRINGE
PREFILLED_SYRINGE | INTRAVENOUS | Status: DC | PRN
  Administered 2018-10-13: 40 mg via INTRAVENOUS

## 2018-10-13 SURGICAL SUPPLY — 23 items
BAG URO CATCHER STRL LF (MISCELLANEOUS) ×2 IMPLANT
BASKET STONE NCOMPASS (UROLOGICAL SUPPLIES) IMPLANT
CATH URET 5FR 28IN OPEN ENDED (CATHETERS) ×2 IMPLANT
CATH URET DUAL LUMEN 6-10FR 50 (CATHETERS) ×2 IMPLANT
CLOTH BEACON ORANGE TIMEOUT ST (SAFETY) ×2 IMPLANT
COVER WAND RF STERILE (DRAPES) IMPLANT
EXTRACTOR STONE NITINOL NGAGE (UROLOGICAL SUPPLIES) ×2 IMPLANT
FIBER LASER FLEXIVA 1000 (UROLOGICAL SUPPLIES) IMPLANT
FIBER LASER FLEXIVA 365 (UROLOGICAL SUPPLIES) IMPLANT
FIBER LASER FLEXIVA 550 (UROLOGICAL SUPPLIES) IMPLANT
FIBER LASER TRAC TIP (UROLOGICAL SUPPLIES) IMPLANT
GLOVE SURG SS PI 8.0 STRL IVOR (GLOVE) IMPLANT
GOWN STRL REUS W/TWL XL LVL3 (GOWN DISPOSABLE) ×2 IMPLANT
GUIDEWIRE STR DUAL SENSOR (WIRE) ×2 IMPLANT
IV NS 1000ML (IV SOLUTION) ×1
IV NS 1000ML BAXH (IV SOLUTION) ×1 IMPLANT
IV NS IRRIG 3000ML ARTHROMATIC (IV SOLUTION) ×2 IMPLANT
MANIFOLD NEPTUNE II (INSTRUMENTS) ×2 IMPLANT
PACK CYSTO (CUSTOM PROCEDURE TRAY) ×2 IMPLANT
SHEATH URETERAL 12FRX35CM (MISCELLANEOUS) ×4 IMPLANT
STENT URET 6FRX24 CONTOUR (STENTS) ×2 IMPLANT
TUBING CONNECTING 10 (TUBING) ×2 IMPLANT
TUBING UROLOGY SET (TUBING) ×2 IMPLANT

## 2018-10-13 NOTE — Anesthesia Procedure Notes (Signed)
Procedure Name: Intubation Date/Time: 10/13/2018 4:13 PM Performed by: Mitzie Na, CRNA Pre-anesthesia Checklist: Patient identified, Emergency Drugs available, Patient being monitored, Timeout performed and Suction available Patient Re-evaluated:Patient Re-evaluated prior to induction Oxygen Delivery Method: Circle system utilized Preoxygenation: Pre-oxygenation with 100% oxygen Induction Type: IV induction Ventilation: Oral airway inserted - appropriate to patient size and Mask ventilation without difficulty Laryngoscope Size: Mac and 4 Grade View: Grade I Tube type: Oral Tube size: 7.5 mm Number of attempts: 1 Airway Equipment and Method: Stylet Placement Confirmation: ETT inserted through vocal cords under direct vision,  positive ETCO2 and breath sounds checked- equal and bilateral Secured at: 24 cm Tube secured with: Tape Dental Injury: Teeth and Oropharynx as per pre-operative assessment

## 2018-10-13 NOTE — H&P (Signed)
CC: I have kidney stones.  HPI: Thomas Howell is a 27 year-old male established patient who is here for renal calculi.  09/13/18: Thomas Howell has a significant kidney stone history. 24 hr urine in 2017 with increased pH and stones Ca Ox 20%, brushite 80%. Urine citrate was low. Prior PTH, Ca, lytes and Uric acid have been normal. Seen by nephrology in October 2017. They started him on potassium citrate, 60 meq's daily His last 24-hour urinalysis was September 2017.   He underwent left URS in 2018 and Mar 2019. He was admitted Aug 2019 with fever and passed a stone. Residual mild left hydro was noted on CT. 10 mm LLP stone noted. His urine cx usually grows staph. He's had no further flank pain.   10/13/18: He was seen in the ED on 10/24 when he presented with complaints of acute onset right flank pain. UA at the time showed >50 WBC, >50 RBC, no bacteria. KUB was done, which showed possible right proximal ureteral calculus. He was discharged with Zofran and Percocet. He states that symptoms began on Monday, but progressed leading to ED visit late last night/ early this morning. He states that he has also had some intermittent pain on the left flank, as well. He continues to have ongoing pain and nausea. He notes urgency, frequency, and straining with voiding. He does have gross hematuria. He denies fever or chills.   The problem is on the right side. This is not his first kidney stone. He has had more than 5 stones prior to getting this one. He is currently having flank pain, nausea, and vomiting. He denies having back pain, groin pain, fever, and chills. He has not caught a stone in his urine strainer since his symptoms began.   He has had eswl, ureteral stent, and ureteroscopy for treatment of his stones in the past.     ALLERGIES: Adhesive tape Codeine Derivatives Hycodan SYRP    MEDICATIONS: Keppra 1,000 mg tablet Oral  Lamictal 100 mg tablet 1 tablet PO BID  Maxalt Mlt 10 mg  tablet,disintegrating 1 tablet PO TID PRN     GU PSH: Cysto Remove Stent FB Sim - 03/22/2018, 2018 Cysto Uretero Lithotripsy - 2016, 2016, 2015 Cystoscopy Insert Stent, Left - 08/10/2017, 2017, 2016, 2016, 2016, 2015, 2015 Cystoscopy Ureteroscopy - 2017, 2016 ESWL - 2016, 2016 Remove Pelvic Lymph Nodes - 2012 Ureteroscopic laser litho, Left - 03/13/2018, Left - 08/19/2017, Left - 2018, Left - 2017, 2017 Ureteroscopic stone removal, Left - 2018      PSH Notes: Cystoscopy Ureteroscopy Lithotripsy Incl Insert Indwelling Ureter Stent, Cystoscopy With Insertion Of Ureteral Stent Left, Cystoscopy With Ureteroscopy Left, Cystoscopy With Ureteroscopy Left, Cystoscopy With Insertion Of Ureteral Stent Left, Cystoscopy With Insertion Of Ureteral Stent Left, Cystoscopy With Ureteroscopy With Lithotripsy, Cystoscopy With Ureteroscopy With Lithotripsy, Cystoscopy With Insertion Of Ureteral Stent Left, Lithotripsy, Lithotripsy, Cystoscopy With Ureteroscopy With Lithotripsy, Cystoscopy With Insertion Of Ureteral Stent Left, Cystoscopy With Insertion Of Ureteral Stent Left, Lymphadenectomy, Foot Surgery   NON-GU PSH: None   GU PMH: Renal calculus - 09/13/2018, - 03/22/2018 (Improving), Left, - 09/21/2017, - 08/16/2017, - 06/27/2017, - 2018, - 2017, - 2017, Kidney stone on left side, - 2017, Nephrolithiasis, - 2017 Ureteral calculus - 09/13/2018, Symptomatic left proximal stone. I reviewed the options including MET, URS and ESWL. He would like URS. I have reviewed the risks of ureteroscopy including bleeding, infection, ureteral injury, need for a stent or secondary procedures, thrombotic events and anesthetic complications. , -  03/13/2018, - 08/16/2017, 4m Left proximal stone with pain and nausea. Left renal stones. I will give him toradol and phenergan and get him set up for Ureteroscopy and stent. he is aware of the risks. , - 2018, - 2017, Calculus of left ureter, - 2017 Hydronephrosis Unspec, Hydronephrosis, left -  2017 Other microscopic hematuria, Microscopic hematuria - 2017 Gross hematuria, Gross hematuria - 2017    NON-GU PMH: Personal history of other diseases of the nervous system and sense organs, History of migraine headaches - 2017 Encounter for general adult medical examination without abnormal findings, Encounter for preventive health examination - 2017    FAMILY HISTORY: Blood In Urine - Mother, Brother Family Health Status Number - Runs In Family nephrolithiasis - Runs In Family   SOCIAL HISTORY: Marital Status: Single Preferred Language: English; Ethnicity: Not Hispanic Or Latino; Race: White Current Smoking Status: Patient has never smoked.  Has never drank.  Drinks 1 caffeinated drink per day. Patient's occupation [employer]     Notes: Current smoker on some days, Alcohol Use, Marital History - Single, Tobacco Use, Caffeine Use, Occupation:   REVIEW OF SYSTEMS:    GU Review Male:   Patient denies frequent urination, hard to postpone urination, burning/ pain with urination, get up at night to urinate, leakage of urine, stream starts and stops, trouble starting your stream, have to strain to urinate , erection problems, and penile pain.  Gastrointestinal (Upper):   Patient reports vomiting. Patient denies nausea and indigestion/ heartburn.  Gastrointestinal (Lower):   Patient denies diarrhea and constipation.  Constitutional:   Patient denies fever, night sweats, weight loss, and fatigue.  Skin:   Patient denies skin rash/ lesion and itching.  Eyes:   Patient denies blurred vision and double vision.  Ears/ Nose/ Throat:   Patient denies sore throat and sinus problems.  Hematologic/Lymphatic:   Patient denies swollen glands and easy bruising.  Cardiovascular:   Patient denies leg swelling and chest pains.  Respiratory:   Patient denies cough and shortness of breath.  Endocrine:   Patient denies excessive thirst.  Musculoskeletal:   Patient denies back pain and joint  pain.  Neurological:   Patient denies headaches and dizziness.  Psychologic:   Patient denies depression and anxiety.   Notes: Weak stream Hematuria Flank Pain    VITAL SIGNS:      10/13/2018 10:27 AM  Weight 230 lb / 104.33 kg  Height 69 in / 175.26 cm  BP 135/87 mmHg  Pulse 96 /min  Temperature 98.0 F / 36.6 C  BMI 34.0 kg/m   GU PHYSICAL EXAMINATION:    Anus and Perineum: No hemorrhoids. No anal stenosis. No rectal fissure, no anal fissure. No edema, no dimple, no perineal tenderness, no anal tenderness.  Scrotum: No lesions. No edema. No cysts. No warts.  Epididymides: Right: no spermatocele, no masses, no cysts, no tenderness, no induration, no enlargement. Left: no spermatocele, no masses, no cysts, no tenderness, no induration, no enlargement.  Testes: No tenderness, no swelling, no enlargement left testes. No tenderness, no swelling, no enlargement right testes. Normal location left testes. Normal location right testes. No mass, no cyst, no varicocele, no hydrocele left testes. No mass, no cyst, no varicocele, no hydrocele right testes.  Urethral Meatus: Normal size. No lesion, no wart, no discharge, no polyp. Normal location.  Penis: Circumcised, no warts, no cracks. No dorsal Peyronie's plaques, no left corporal Peyronie's plaques, no right corporal Peyronie's plaques, no scarring, no warts. No balanitis, no meatal  stenosis.  Prostate: 40 gram or 2+ size. Left lobe normal consistency, right lobe normal consistency. Symmetrical lobes. No prostate nodule. Left lobe no tenderness, right lobe no tenderness.  Seminal Vesicles: Nonpalpable.  Sphincter Tone: Normal sphincter. No rectal tenderness. No rectal mass.    MULTI-SYSTEM PHYSICAL EXAMINATION:    Constitutional: Well-nourished. No physical deformities. Normally developed. Good grooming.  Neck: Neck symmetrical, not swollen. Normal tracheal position.  Respiratory: No labored breathing, no use of accessory muscles.    Cardiovascular: Normal temperature, normal extremity pulses, no swelling, no varicosities.  Lymphatic: No enlargement of neck, axillae, groin.  Skin: No paleness, no jaundice, no cyanosis. No lesion, no ulcer, no rash.  Neurologic / Psychiatric: Oriented to time, oriented to place, oriented to person. No depression, no anxiety, no agitation.  Gastrointestinal: No mass, no tenderness, no rigidity, non obese abdomen.  Eyes: Normal conjunctivae. Normal eyelids.  Ears, Nose, Mouth, and Throat: Left ear no scars, no lesions, no masses. Right ear no scars, no lesions, no masses. Nose no scars, no lesions, no masses. Normal hearing. Normal lips.  Musculoskeletal: Normal gait and station of head and neck.     PAST DATA REVIEWED:  Source Of History:  Patient   PROCEDURES:         Renal Ultrasound - [id number]  Right Kidney: Length: 11.7 cm Depth:5.4 cm Cortical Width: 1.9 cm Width: 4.6 cm  Left Kidney: Length:11.6 cm Depth: 5.7 cm Cortical Width: 1.4 cm Width: 5.2 cm  Left Kidney/Ureter:  ? Fullness noted. ---- Multiple calcifications with the largest measuring 1.0 cm in the lower pole.   Right Kidney/Ureter:  ? Fullness vs mild hydronephrosis noted. ------- Multiple echogenic foci with the largest measuring 0.6 cm in the mid kidney.   Bladder:  PVR = not visualized.       This exam is limited due to bowel gas, rib shadowing, etc. Some measurements are estimated.          Urinalysis w/Scope Dipstick Dipstick Cont'd Micro  Color: Red Bilirubin: Neg mg/dL WBC/hpf: 0 - 5/hpf  Appearance: Turbid Ketones: Trace mg/dL RBC/hpf: >60/hpf  Specific Gravity: 1.025 Blood: 3+ ery/uL Bacteria: Rare (0-9/hpf)  pH: 5.5 Protein: 3+ mg/dL Cystals: NS (Not Seen)  Glucose: Neg mg/dL Urobilinogen: 0.2 mg/dL Casts: NS (Not Seen)    Nitrites: Neg Trichomonas: Not Present    Leukocyte Esterase: 2+ leu/uL Mucous: Not Present      Epithelial Cells: NS (Not Seen)      Yeast: NS (Not Seen)      Sperm: Not Present     Notes: MICROSCOPIC PERFORMED ON UNCONCENTRATED URINE          Phenergan 84m - [id number], [id number] injection site was sterilely prepped with alcohol. Phenergan was injected (IM) using standard technique. The patient tolerated the procedure well. A band aid was applied. The site was dry when the patient left the exam room.   After treatment was administered, patient was observed to be symptom-free for 10-15 minutes.   Qty: 25 Adm. By: NVerlee Monte Unit: mg Lot No [id number] Route: IM Exp. Date 01/20/2019  Freq: None Mfgr.:   Site: Right Buttock   ASSESSMENT:      ICD-10 Details  1 GU:   Ureteral calculus - N20.1   2   Renal calculus - N20.0    PLAN:           Orders Labs Urine Culture  X-Rays: Renal Ultrasound   His options were reviewed and he would  like to proceed with right ureteroscopy with laser and stent this evening.  Risks reviewed.          Schedule Procedure: 10/13/2018 at [facility] - [phone] - Phenergan 62m (Phenergan Per 50 Mg) - [phone]         Document Letter(s):  Created for Patient: Clinical Summary         Next Appointment:      Next Appointment: 10/20/2018 08:15 AM    Appointment Type: Postoperative Appointment    Location: [facility] -[phone]   Provider: BAzucena Fallen   Reason for Visit: PO 1 WEEK

## 2018-10-13 NOTE — Transfer of Care (Signed)
Immediate Anesthesia Transfer of Care Note  Patient: Thomas Howell  Procedure(s) Performed: CYSTOSCOPY/RETROGRADE RIGHT URETEROSCOPY/ AND RIGHTSTENT PLACEMENT (N/A )  Patient Location: PACU  Anesthesia Type:General  Level of Consciousness: awake, alert , oriented and patient cooperative  Airway & Oxygen Therapy: Patient Spontanous Breathing and Patient connected to face mask oxygen  Post-op Assessment: Report given to RN, Post -op Vital signs reviewed and stable and Patient moving all extremities  Post vital signs: Reviewed and stable  Last Vitals:  Vitals Value Taken Time  BP 149/77 10/13/2018  4:57 PM  Temp 36.9 C 10/13/2018  4:57 PM  Pulse 97 10/13/2018  4:57 PM  Resp 19 10/13/2018  4:57 PM  SpO2      Last Pain:  Vitals:   10/13/18 1405  PainSc: 8          Complications: No apparent anesthesia complications

## 2018-10-13 NOTE — ED Provider Notes (Signed)
[facility] EMERGENCY DEPARTMENT Provider Note   CSN: [id number] Arrival date & time: 10/12/18  2333     History   Chief Complaint Chief Complaint  Patient presents with  . Flank Pain    HPI Thomas Howell is a 27 y.o. male.  The history is provided by the patient.  Flank Pain  This is a new problem. The current episode started 3 to 5 hours ago. The problem occurs constantly. The problem has been rapidly worsening. Pertinent negatives include no chest pain and no abdominal pain. Nothing aggravates the symptoms. Nothing relieves the symptoms. He has tried nothing for the symptoms.  Patient with history of kidney stones presents with right flank pain.  He reports a long history of kidney stones, with multiple surgical procedures.  He typically gets pain in the left side, but tonight he began having right flank pain.  He reports nausea and vomiting.   no fevers. Pain does not radiate to his groin.    Past Medical History:  Diagnosis Date  . Complication of anesthesia   . Frequency-urgency syndrome   . History of kidney stones   . Hx of nausea and vomiting    d/t kidney stone  . Hx of sepsis 08/11/2017   due to kidney stone/ hydronephrosis  . Left ureteral stone   . Migraine   . Pain due to ureteral stent (HCC)   . PONV (postoperative nausea and vomiting)    none recently  . Renal calculi    bilateral per ct 07-26-2016  . Scoliosis of lumbar spine 1994   treated at [facility] until age 62  . Wears glasses     Patient Active Problem List   Diagnosis Date Noted  . Acute pyelonephritis 08/14/2018  . Bilateral hydronephrosis 08/14/2018  . Sepsis (HCC) 08/14/2018  . SVT (supraventricular tachycardia) (HCC) 05/22/2018  . Attention deficit hyperactivity disorder (ADHD), combined type 04/08/2018  . Chronic migraine 02/14/2017  . Neck pain 02/04/2016  . Hydronephrosis, left 08/19/2015  . Hydronephrosis determined by ultrasound   . Kidney stone on left side 08/18/2015  .  Nephrolithiasis 08/18/2015  . Kidney stone 07/06/2015  . Left ureteral stone 07/06/2015  . Analgesic rebound headache 06/05/2014  . Migraine headache without aura 05/30/2014    Past Surgical History:  Procedure Laterality Date  . CYSTOSCOPY W/ RETROGRADES Right 07/06/2015   Procedure: CYSTOSCOPY WITH RETROGRADE PYELOGRAM;  Surgeon: Jerilee Field, MD;  Location: [facility];  Service: Urology;  Laterality: Right;  . CYSTOSCOPY W/ URETERAL STENT PLACEMENT Left 08/08/2015   Procedure: CYSTOSCOPY WITH STENT REPLACEMENT;  Surgeon: Jerilee Field, MD;  Location[facility];  Service: Urology;  Laterality: Left;  . CYSTOSCOPY W/ URETERAL STENT PLACEMENT Left 02/21/2017   Procedure: CYSTOSCOPY WITH RETROGRADE PYELOGRAM/URETERAL LEFT STENT PLACEMENT WITH  LASER;  Surgeon: Bjorn Pippin, MD;  Location: [facility];  Service: Urology;  Laterality: Left;  . CYSTOSCOPY W/ URETERAL STENT PLACEMENT Left 08/10/2017   Procedure: CYSTOSCOPY WITH RETROGRADE PYELOGRAM/URETERAL STENT PLACEMENT;  Surgeon: Heloise Purpura, MD;  Location: [facility];  Service: Urology;  Laterality: Left;  . CYSTOSCOPY WITH RETROGRADE PYELOGRAM, URETEROSCOPY AND STENT PLACEMENT Left 06/24/2014   Procedure: CYSTOSCOPY WITH RETROGRADE PYELOGRAM,  AND STENT PLACEMENT;  Surgeon: Magdalene Molly, MD;  Location: [facility];  Service: Urology;  Laterality: Left;  . CYSTOSCOPY WITH RETROGRADE PYELOGRAM, URETEROSCOPY AND STENT PLACEMENT Left 07/05/2014   Procedure: CYSTO/LEFT URETEROSCOPY/LEFT RETROGRADE PYELOGRAM/LEFT STENT PLACEMENT;  Surgeon: Jerilee Field, MD;  Location: [facility];  Service: Urology;  Laterality: Left;  . CYSTOSCOPY WITH RETROGRADE PYELOGRAM, URETEROSCOPY AND STENT PLACEMENT Left 07/06/2015   Procedure: CYSTOSCOPY WITH RETROGRADE PYELOGRAM, URETEROSCOPY , LASER, STENT PLACEMENT and BASKET EXTRACTION;  Surgeon: Jerilee Field, MD;  Location: [facility];  Service: Urology;  Laterality: Left;  . CYSTOSCOPY WITH  RETROGRADE PYELOGRAM, URETEROSCOPY AND STENT PLACEMENT Left 08/19/2015   Procedure: CYSTOSCOPY WITH RETROGRADE PYELOGRAM, URETEROSCOPY AND STENT PLACEMENT;  Surgeon: Jerilee Field, MD;  Location: [facility];  Service: Urology;  Laterality: Left;  . CYSTOSCOPY WITH RETROGRADE PYELOGRAM, URETEROSCOPY AND STENT PLACEMENT Left 03/13/2018   Procedure: CYSTOSCOPY WITH RETROGRADE PYELOGRAM, URETEROSCOPY AND LEFT STENT PLACEMENT;  Surgeon: Bjorn Pippin, MD;  Location: [facility];  Service: Urology;  Laterality: Left;  . CYSTOSCOPY WITH URETEROSCOPY AND STENT PLACEMENT Left 01/16/2016   Procedure: CYSTOSCOPY WITH LEFT RETROGRADE PYELOGRAM  LEFT DIGITAL URETEROSCOPY AND PLACEMENT LEFT URETERAL STENT;  Surgeon: Jerilee Field, MD;  Location: [facility];  Service: Urology;  Laterality: Left;  . CYSTOSCOPY WITH URETEROSCOPY, STONE BASKETRY AND STENT PLACEMENT Left 02/13/2016   Procedure: CYSTOSCOPY WITH LEFT URETEROSCOPY, HOLMIUM LASER AND STENT PLACEMENT;  Surgeon: Jerilee Field, MD;  Location: [facility];  Service: Urology;  Laterality: Left;  . CYSTOSCOPY/RETROGRADE/URETEROSCOPY/STONE EXTRACTION WITH BASKET Left 08/08/2015   Procedure: CYSTOSCOPY/RETROGRADE/URETEROSCOPY/STONE EXTRACTION WITH BASKET;  Surgeon: Jerilee Field, MD;  Location: [facility];  Service: Urology;  Laterality: Left;  . CYSTOSCOPY/URETEROSCOPY/HOLMIUM LASER/STENT PLACEMENT Left 07/30/2016   Procedure: CYSTOSCOPY/URETEROSCOPY/HOLMIUM LASER/STENT PLACEMENT;  Surgeon: Jerilee Field, MD;  Location: [facility];  Service: Urology;  Laterality: Left;  . CYSTOSCOPY/URETEROSCOPY/HOLMIUM LASER/STENT PLACEMENT Left 08/19/2017   Procedure: CYSTOSCOPY/URETEROSCOPY/HOLMIUM LASER/STENT PLACEMENT;  Surgeon: Jerilee Field, MD;  Location: [facility];  Service: Urology;  Laterality: Left;  . EXTRACORPOREAL SHOCK WAVE LITHOTRIPSY Left 07-07-2015  &  12-26-2014  . FOOT CAPSULE RELEASE W/ PERCUTANEOUS HEEL CORD  LENGTHENING, TIBIAL TENDON TRANSFER Left 1994   clubfoot  . HOLMIUM LASER APPLICATION Left 07/05/2014   Procedure: LASER LITHO;  Surgeon: Jerilee Field, MD;  Location: [facility];  Service: Urology;  Laterality: Left;  . HOLMIUM LASER APPLICATION Left 08/08/2015   Procedure: HOLMIUM LASER  WITH LITHOTRIPSY ;  Surgeon: Jerilee Field, MD;  Location: [facility];  Service: Urology;  Laterality: Left;  . HOLMIUM LASER APPLICATION Left 02/21/2017   Procedure: HOLMIUM LASER APPLICATION;  Surgeon: Bjorn Pippin, MD;  Location: [facility];  Service: Urology;  Laterality: Left;  . HOLMIUM LASER APPLICATION Left 03/13/2018   Procedure: HOLMIUM LASER APPLICATION;  Surgeon: Bjorn Pippin, MD;  Location: [facility];  Service: Urology;  Laterality: Left;  . LYMPH GLAND EXCISION  2003   neck--  benign  . SVT ABLATION N/A 05/22/2018   Procedure: SVT ABLATION;  Surgeon: Marinus Maw, MD;  Location: [facility];  Service: Cardiovascular;  Laterality: N/A;        Home Medications    Prior to Admission medications   Medication Sig Start Date End Date Taking? Authorizing Provider  acetaminophen (TYLENOL) 500 MG tablet Take 500 mg by mouth every 4 (four) hours as needed for fever.    [provider]  amphetamine-dextroamphetamine (ADDERALL) 10 MG tablet Take one po qam then one po 4 hours later Patient not taking: Reported on 08/14/2018 04/07/18   Campbell Riches, NP  BOTOX 100 units SOLR injection INJECT IN THE MUSCLE TO HEAD AND NECK MUSCLES EVERY 3 MONTHS BY PROVIDER Patient taking differently: Inject 100 Units into the muscle every 3 (  three) months.  06/06/17   Sater, Pearletha Furl, MD  butalbital-acetaminophen-caffeine (FIORICET, ESGIC) [phone] MG tablet TAKE 1 TABLET BY MOUTH EVERY 6 HOURS AS NEEDED FOR HEADACHE 10/12/18   Sater, Pearletha Furl, MD  hydrochlorothiazide (HYDRODIURIL) 25 MG tablet Take 25 mg by mouth 2 (two) times daily.    [provider]    lamoTRIgine (LAMICTAL) 100 MG tablet One po bid Patient taking differently: Take 100 mg by mouth 2 (two) times daily.  03/02/18   Sater, Pearletha Furl, MD  levETIRAcetam (KEPPRA) 750 MG tablet TAKE 1 TABLET BY MOUTH EVERY MORNING AND 2 TABLETS EVERY NIGHT AT BEDTIME 03/02/18   Sater, Pearletha Furl, MD  ondansetron (ZOFRAN ODT) 4 MG disintegrating tablet Take 1 tablet (4 mg total) by mouth every 8 (eight) hours as needed for nausea or vomiting. 05/23/18   Elson Areas, PA-C  Potassium Citrate 15 MEQ (1620 MG) TBCR Take 30 mEq by mouth 2 (two) times daily. 03/28/18   [provider]  rizatriptan (MAXALT-MLT) 10 MG disintegrating tablet Take one tablet at onset of headache. May repeat once in 2 hours. Max of 2 tablets in 24 hours and 4 tablets per week 07/24/18   Sater, Pearletha Furl, MD    Family History Family History  Problem Relation Age of Onset  . Cancer Father   . Kidney Stones Mother   . Cancer Other   . Hypertension Other   . Hyperlipidemia Other   . Stroke Other   . Kidney Stones Brother     Social History Social History   Tobacco Use  . Smoking status: Former Smoker    Years: 2.00    Last attempt to quit: 07/29/2014    Years since quitting: 4.2  . Smokeless tobacco: Never Used  . Tobacco comment: social smoker  Substance Use Topics  . Alcohol use: Yes    Alcohol/week: 0.0 standard drinks    Comment: occ  . Drug use: No     Allergies   Bactrim [sulfamethoxazole-trimethoprim]; Codeine; and Adhesive [tape]   Review of Systems Review of Systems  Constitutional: Negative for fever.  Respiratory: Positive for cough.   Cardiovascular: Negative for chest pain.  Gastrointestinal: Negative for abdominal pain.  Genitourinary: Positive for flank pain.  All other systems reviewed and are negative.    Physical Exam Updated Vital Signs BP (!) 139/98 (BP Location: Right Arm)   Pulse (!) 130   Temp 98.8 F (37.1 C) (Oral)   Resp 18   Ht 1.753 m (5\' 9" )   Wt 104.3 kg    SpO2 99%   BMI 33.97 kg/m   Physical Exam CONSTITUTIONAL: Well developed/well nourished, uncomfortable appearing HEAD: Normocephalic/atraumatic EYES: EOMI/PERRL ENMT: Mucous membranes moist NECK: supple no meningeal signs SPINE/BACK:entire spine nontender CV: S1/S2 noted, no murmurs/rubs/gallops noted LUNGS: Lungs are clear to auscultation bilaterally, no apparent distress ABDOMEN: soft, nontender, no rebound or guarding, bowel sounds noted throughout abdomen WG:NFAOZ cva tenderness NEURO: Pt is awake/alert/appropriate, moves all extremitiesx4.  No facial droop.   EXTREMITIES: pulses normal/equal, full ROM SKIN: warm, color normal PSYCH: no abnormalities of mood noted, alert and oriented to situation   ED Treatments / Results  Labs (all labs ordered are listed, but only abnormal results are displayed) Labs Reviewed  URINALYSIS, ROUTINE W REFLEX MICROSCOPIC - Abnormal; Notable for the following components:      Result Value   Color, Urine ORANGE (*)    APPearance HAZY (*)    Hgb urine dipstick LARGE (*)  Protein, ur 100 (*)    RBC / HPF >50 (*)    WBC, UA >50 (*)    All other components within normal limits  URINE CULTURE    EKG None  Radiology Dg Abdomen 1 View  Result Date: 10/13/2018 CLINICAL DATA:  Right flank pain, vomiting EXAM: ABDOMEN - 1 VIEW COMPARISON:  03/22/2018.  CT 08/14/2018 FINDINGS: 6 mm calcification projects medial to the lower pole of the right kidney, possibly within the proximal right ureter. 9 mm left lower pole renal stone noted. Nonobstructive bowel gas pattern. No organomegaly or free air. IMPRESSION: Left lower pole nephrolithiasis. 6 mm calcification projects medial to the right lower kidney and may be within the proximal right ureter. Electronically Signed   By: Charlett Nose M.D.   On: 10/13/2018 02:55    Procedures Procedures    Medications Ordered in ED Medications  HYDROmorphone (DILAUDID) injection 1 mg (has no administration in  time range)  HYDROmorphone (DILAUDID) injection 1 mg (1 mg Intravenous Given 10/13/18 0025)  ondansetron (ZOFRAN) injection 4 mg (4 mg Intravenous Given 10/13/18 0025)  ketorolac (TORADOL) 30 MG/ML injection 30 mg (30 mg Intravenous Given 10/13/18 0025)  HYDROmorphone (DILAUDID) injection 1 mg (1 mg Intravenous Given 10/13/18 0122)  HYDROmorphone (DILAUDID) injection 1 mg (1 mg Intravenous Given 10/13/18 0219)     Initial Impression / Assessment and Plan / ED Course  I have reviewed the triage vital signs and the nursing notes.  Pertinent labs/imaging results that were available during my care of the patient were reviewed by me and considered in my medical decision making (see chart for details). Narcotic database reviewed and considered in decision making    3:22 AM PT With extensive history of kidney stones that required multiple procedures.  He is very familiar with this pain.  We elected to do a KUB x-ray due to the fact that he has had over 10 CT  scans performed and this would limit radiation.  KUB does confirm proximal right ureteral stone.  Urine shows hematuria without signs of infection.  Patient is afebrile and in no acute distress.  He would like to try to go home. Heart rate is improved. BP 115/89 (BP Location: Left Arm)   Pulse (!) 108   Temp 98.8 F (37.1 C) (Oral)   Resp 18   Ht 1.753 m (5\' 9" )   Wt 104.3 kg   SpO2 96%   BMI 33.97 kg/m  He will call his urologist in the next several hours. He will be given a short prescription of pain medications and nausea medicines. He is not septic appearing, is otherwise in no acute distress.  Final Clinical Impressions(s) / ED Diagnoses   Final diagnoses:  Kidney stone  Ureterolithiasis    ED Discharge Orders         Ordered    ondansetron (ZOFRAN ODT) 8 MG disintegrating tablet  Every 8 hours PRN     10/13/18 0322    oxyCODONE-acetaminophen (PERCOCET) 5-325 MG tablet  Every 8 hours PRN     10/13/18 1610            Zadie Rhine, MD 10/13/18 [phone]

## 2018-10-13 NOTE — Discharge Instructions (Signed)

## 2018-10-13 NOTE — Op Note (Signed)
Procedure: 1.  Cystoscopy with bilateral retrograde pyelograms and interpretation. 2.  Right ureteroscopy. 3.  Insertion of right double-J stent.  Preop diagnosis: Right proximal ureteral stone.  Postop diagnosis: Same.  Surgeon: Dr. Bjorn Pippin.  Anesthesia: General.  Drain: 6 French by 24 cm right contour double-J stent.  Specimen: None.  EBL: None.  Complications: None.  Indications: Thomas Howell is a 27 year old white male with a history of stones who presented with right flank pain today he was found to have an approximately 5 mm stone at the UPJ and his pain is poorly controlled.  He requested intervention this evening and was told he would in a minimum get a stent and an attempt at ureteroscopy would be made as well.  Procedure: He was taken the operating room where he was given antibiotics.  A general anesthetic was induced.  He was placed in lithotomy position.  His perineum and genitalia were prepped with Betadine solution he was draped in usual sterile fashion.  Cystoscopy was performed using the 23 [place] scope and 30 degree lens.  Examination revealed normal urethra.  The external sphincter was intact.  The prostatic urethra short without significant obstruction.  Termination of bladder revealed mild trabeculation.  No tumors stones or inflammation were noted.  But there was a little bit of blood in the bladder.  The ureteral orifices were unremarkable.  Fluoroscopy was performed and there was a lozenge shaped shadow in the left pelvis that was suggestive of a large distal ureteral stone but that had not been apparent on prior KUB.  I felt left retrograde pyelography was indicated to assess that shadow.  The right proximal stone was unchanged in location from earlier today.  Retrograde pyelography was performed using a 5 French opening catheter and Omnipaque.  The right retrograde pyelogram demonstrated a normal ureter up to the UPJ where there was a filling defect consistent with  a stone.  There was flow by the stone into the hydronephrotic renal collecting system.  A partial left retrograde pyelogram demonstrated that the pelvic shadow was extra ureteral and not a stone and most consistent with a pill shadow.  A sensor guidewire was passed up the right ureter to the kidney but required stiffening with the open-end catheter passed up to just below the stone to negotiate the wire into the renal pelvis.  The open-end catheter was then removed.  A 12 French 35 cm introducer sheath inner core was then advanced over the wire but it would not pass the intramural ureter.  I then passed the 4.5 French semirigid ureteroscope alongside the wire and was able to advance it to approximately 4 cm below the stone before angulation of the ureter prevented further progression.  After passage of the ureteroscope I was able to advance the introducer sheath inner core to the mid ureter however I was not able to advance the assembled sheath.  I then attempted to advance the 8 French single-lumen mid digital flexible ureteroscope alongside the guidewire but I was able only to get it about 3 to 4 cm into the ureter and there was some mucosal disruption in this area so I did not feel comfortable trying to advance it further and force it.  Ureteroscope was then removed.  The cystoscope was reinserted over the wire and a 6 French 24 cm contour double-J stent without tether was passed to the kidney under fluoroscopic guidance.  The wire was removed, leaving a good coil in the kidney and a good coil in  the bladder.  The bladder was drained and the cystoscope was removed.  The urethra was then instilled with 20 mL of 2% lidocaine jelly and a gauze wrap was used to maintain the jelly in the urethra.  He was then taken down from the lithotomy position, his anesthetic was reversed and he was moved to recovery room in stable condition.  There were no complications.  He will be scheduled for a subsequent  procedure for definitive management of the stone with either lithotripsy or ureteroscopy.

## 2018-10-13 NOTE — Anesthesia Preprocedure Evaluation (Addendum)
Anesthesia Evaluation  Patient identified by MRN, date of birth, ID band Patient awake    Reviewed: Allergy & Precautions, NPO status , Patient's Chart, lab work & pertinent test results  History of Anesthesia Complications (+) PONV  Airway Mallampati: II  TM Distance: >3 FB Neck ROM: Full    Dental no notable dental hx. (+) Teeth Intact, Dental Advisory Given   Pulmonary neg pulmonary ROS, former smoker,    Pulmonary exam normal breath sounds clear to auscultation       Cardiovascular negative cardio ROS Normal cardiovascular exam Rhythm:Regular Rate:Normal     Neuro/Psych  Headaches, negative psych ROS   GI/Hepatic negative GI ROS, Neg liver ROS,   Endo/Other  negative endocrine ROS  Renal/GU   negative genitourinary   Musculoskeletal negative musculoskeletal ROS (+)   Abdominal   Peds  Hematology negative hematology ROS (+)   Anesthesia Other Findings Right renal stone  Reproductive/Obstetrics                            Anesthesia Physical Anesthesia Plan  ASA: II  Anesthesia Plan: General   Post-op Pain Management:    Induction: Intravenous  PONV Risk Score and Plan: 3 and Dexamethasone, Ondansetron, Midazolam and Treatment may vary due to age or medical condition  Airway Management Planned: Oral ETT  Additional Equipment:   Intra-op Plan:   Post-operative Plan: Extubation in OR  Informed Consent: I have reviewed the patients History and Physical, chart, labs and discussed the procedure including the risks, benefits and alternatives for the proposed anesthesia with the patient or authorized representative who has indicated his/her understanding and acceptance.   Dental advisory given  Plan Discussed with: CRNA  Anesthesia Plan Comments:        Anesthesia Quick Evaluation

## 2018-10-14 LAB — URINE CULTURE: Culture: NO GROWTH

## 2018-10-15 NOTE — Anesthesia Postprocedure Evaluation (Signed)
Anesthesia Post Note  Patient: SHALOM MCGUINESS  Procedure(s) Performed: CYSTOSCOPY/RETROGRADE RIGHT URETEROSCOPY/ AND RIGHTSTENT PLACEMENT (N/A )     Patient location during evaluation: PACU Anesthesia Type: General Level of consciousness: awake and alert Pain management: pain level controlled Vital Signs Assessment: post-procedure vital signs reviewed and stable Respiratory status: spontaneous breathing, nonlabored ventilation, respiratory function stable and patient connected to nasal cannula oxygen Cardiovascular status: blood pressure returned to baseline and stable Postop Assessment: no apparent nausea or vomiting Anesthetic complications: no    Last Vitals:  Vitals:   10/13/18 1727 10/13/18 1818  BP: (!) 151/84   Pulse: 84 74  Resp: 14   Temp: 37 C 36.6 C  SpO2: 93% 96%    Last Pain:  Vitals:   10/13/18 1818  PainSc: 7                  Angles Trevizo L Freddi Forster

## 2018-10-16 ENCOUNTER — Encounter (HOSPITAL_COMMUNITY): Payer: Self-pay | Admitting: Urology

## 2018-10-22 ENCOUNTER — Encounter (HOSPITAL_COMMUNITY): Payer: Self-pay | Admitting: Emergency Medicine

## 2018-10-22 ENCOUNTER — Emergency Department (HOSPITAL_COMMUNITY)
Admission: EM | Admit: 2018-10-22 | Discharge: 2018-10-22 | Disposition: A | Discharge status: Home / Self Care | Payer: BLUE CROSS/BLUE SHIELD | Attending: Emergency Medicine | Admitting: Emergency Medicine

## 2018-10-22 ENCOUNTER — Emergency Department (HOSPITAL_COMMUNITY): Payer: BLUE CROSS/BLUE SHIELD

## 2018-10-22 ENCOUNTER — Other Ambulatory Visit: Payer: Self-pay

## 2018-10-22 DIAGNOSIS — R1031 Right lower quadrant pain: Secondary | ICD-10-CM | POA: Diagnosis not present

## 2018-10-22 DIAGNOSIS — N210 Calculus in bladder: Secondary | ICD-10-CM | POA: Diagnosis not present

## 2018-10-22 DIAGNOSIS — N200 Calculus of kidney: Secondary | ICD-10-CM | POA: Diagnosis not present

## 2018-10-22 DIAGNOSIS — Z87891 Personal history of nicotine dependence: Secondary | ICD-10-CM | POA: Diagnosis not present

## 2018-10-22 DIAGNOSIS — R109 Unspecified abdominal pain: Secondary | ICD-10-CM | POA: Diagnosis not present

## 2018-10-22 DIAGNOSIS — Z79899 Other long term (current) drug therapy: Secondary | ICD-10-CM | POA: Diagnosis not present

## 2018-10-22 LAB — CBC WITH DIFFERENTIAL/PLATELET
Abs Immature Granulocytes: 0.04 10*3/uL (ref 0.00–0.07)
Basophils Absolute: 0 10*3/uL (ref 0.0–0.1)
Basophils Relative: 0 %
Eosinophils Absolute: 0.1 10*3/uL (ref 0.0–0.5)
Eosinophils Relative: 2 %
HCT: 45.9 % (ref 39.0–52.0)
Hemoglobin: 15.4 g/dL (ref 13.0–17.0)
Immature Granulocytes: 1 %
Lymphocytes Relative: 38 %
Lymphs Abs: 2.9 10*3/uL (ref 0.7–4.0)
MCH: 30 pg (ref 26.0–34.0)
MCHC: 33.6 g/dL (ref 30.0–36.0)
MCV: 89.3 fL (ref 80.0–100.0)
Monocytes Absolute: 0.7 10*3/uL (ref 0.1–1.0)
Monocytes Relative: 10 %
Neutro Abs: 3.8 10*3/uL (ref 1.7–7.7)
Neutrophils Relative %: 49 %
Platelets: 248 10*3/uL (ref 150–400)
RBC: 5.14 MIL/uL (ref 4.22–5.81)
RDW: 12.4 % (ref 11.5–15.5)
WBC: 7.7 10*3/uL (ref 4.0–10.5)
nRBC: 0 % (ref 0.0–0.2)

## 2018-10-22 LAB — URINALYSIS, ROUTINE W REFLEX MICROSCOPIC
Bacteria, UA: NONE SEEN
Bilirubin Urine: NEGATIVE
Glucose, UA: NEGATIVE mg/dL
Ketones, ur: NEGATIVE mg/dL
Nitrite: NEGATIVE
Protein, ur: 100 mg/dL — AB
RBC / HPF: >50 RBC/hpf — ABNORMAL HIGH (ref 0–5)
Specific Gravity, Urine: 1.018 (ref 1.005–1.030)
pH: 7 (ref 5.0–8.0)

## 2018-10-22 LAB — COMPREHENSIVE METABOLIC PANEL
ALT: 25 U/L (ref 0–44)
AST: 26 U/L (ref 15–41)
Albumin: 4.4 g/dL (ref 3.5–5.0)
Alkaline Phosphatase: 71 U/L (ref 38–126)
Anion gap: 10 (ref 5–15)
BUN: 14 mg/dL (ref 6–20)
CO2: 25 mmol/L (ref 22–32)
Calcium: 9.5 mg/dL (ref 8.9–10.3)
Chloride: 105 mmol/L (ref 98–111)
Creatinine, Ser: 0.89 mg/dL (ref 0.61–1.24)
GFR calc Af Amer: >60 mL/min (ref >60)
GFR calc non Af Amer: >60 mL/min (ref >60)
Glucose, Bld: 87 mg/dL (ref 70–99)
Potassium: 4 mmol/L (ref 3.5–5.1)
Sodium: 140 mmol/L (ref 135–145)
Total Bilirubin: 0.7 mg/dL (ref 0.3–1.2)
Total Protein: 8.2 g/dL — ABNORMAL HIGH (ref 6.5–8.1)

## 2018-10-22 MED ORDER — ONDANSETRON HCL 4 MG/2ML IJ SOLN
4.0000 mg | Freq: Once | INTRAMUSCULAR | Status: AC
  Administered 2018-10-22: 4 mg via INTRAVENOUS
  Filled 2018-10-22: qty 2

## 2018-10-22 MED ORDER — HYDROMORPHONE HCL 1 MG/ML IJ SOLN
1.0000 mg | Freq: Once | INTRAMUSCULAR | Status: AC
  Administered 2018-10-22: 1 mg via INTRAVENOUS
  Filled 2018-10-22: qty 1

## 2018-10-22 MED ORDER — SODIUM CHLORIDE 0.9 % IV BOLUS
1000.0000 mL | Freq: Once | INTRAVENOUS | Status: AC
  Administered 2018-10-22: 1000 mL via INTRAVENOUS

## 2018-10-22 MED ORDER — ONDANSETRON 4 MG PO TBDP: 4.0000 mg | ORAL_TABLET | Freq: Three times a day (TID) | ORAL | 0 refills | Status: DC | PRN

## 2018-10-22 MED ORDER — OXYCODONE-ACETAMINOPHEN 5-325 MG PO TABS: 2.0000 | ORAL_TABLET | ORAL | 0 refills | Status: DC | PRN

## 2018-10-22 NOTE — ED Notes (Signed)
Urine deep red with some clots in it.  Pt reports that his pain is increased after voiding.  Will notify EDP

## 2018-10-22 NOTE — ED Provider Notes (Signed)
[facility] EMERGENCY DEPARTMENT Provider Note   CSN: [id number] Arrival date & time: 10/22/18  1637     History   Chief Complaint Chief Complaint  Patient presents with  . Flank Pain    HPI Thomas Howell is a 27 y.o. male.  HPI Patient presents with concern of right flank and right lower abdominal pain, nausea. Patient has multiple medical issues including frequent kidney stones. Patient had stent placed 1 week ago due to right-sided stone. Is scheduled for follow-up in 1 week. He notes that the pain is reasonably well controlled on discharge, but over the past day he has developed severe sharp pain in the right side, not improved with Tylenol. There is associated nausea, but no vomiting. No fever. Continues to have urine, though he notes that he is having blood clot production as well.    Past Medical History:  Diagnosis Date  . Complication of anesthesia   . Frequency-urgency syndrome   . History of kidney stones   . Hx of nausea and vomiting    d/t kidney stone  . Hx of sepsis 08/11/2017   due to kidney stone/ hydronephrosis  . Left ureteral stone   . Migraine   . Pain due to ureteral stent (HCC)   . PONV (postoperative nausea and vomiting)    none recently  . Renal calculi    bilateral per ct 07-26-2016  . Scoliosis of lumbar spine 1994   treated at [facility] until age 89  . Wears glasses     Patient Active Problem List   Diagnosis Date Noted  . Right ureteral stone 10/13/2018  . Acute pyelonephritis 08/14/2018  . Bilateral hydronephrosis 08/14/2018  . Sepsis (HCC) 08/14/2018  . SVT (supraventricular tachycardia) (HCC) 05/22/2018  . Attention deficit hyperactivity disorder (ADHD), combined type 04/08/2018  . Chronic migraine 02/14/2017  . Neck pain 02/04/2016  . Hydronephrosis, left 08/19/2015  . Hydronephrosis determined by ultrasound   . Kidney stone on left side 08/18/2015  . Nephrolithiasis 08/18/2015  . Kidney stone 07/06/2015  . Left  ureteral stone 07/06/2015  . Analgesic rebound headache 06/05/2014  . Migraine headache without aura 05/30/2014    Past Surgical History:  Procedure Laterality Date  . CYSTOSCOPY W/ RETROGRADES Right 07/06/2015   Procedure: CYSTOSCOPY WITH RETROGRADE PYELOGRAM;  Surgeon: Jerilee Field, MD;  Location: [facility];  Service: Urology;  Laterality: Right;  . CYSTOSCOPY W/ URETERAL STENT PLACEMENT Left 08/08/2015   Procedure: CYSTOSCOPY WITH STENT REPLACEMENT;  Surgeon: Jerilee Field, MD;  Location: [facility];  Service: Urology;  Laterality: Left;  . CYSTOSCOPY W/ URETERAL STENT PLACEMENT Left 02/21/2017   Procedure: CYSTOSCOPY WITH RETROGRADE PYELOGRAM/URETERAL LEFT STENT PLACEMENT WITH  LASER;  Surgeon: Bjorn Pippin, MD;  Location: [facility];  Service: Urology;  Laterality: Left;  . CYSTOSCOPY W/ URETERAL STENT PLACEMENT Left 08/10/2017   Procedure: CYSTOSCOPY WITH RETROGRADE PYELOGRAM/URETERAL STENT PLACEMENT;  Surgeon: Heloise Purpura, MD;  Location: [facility];  Service: Urology;  Laterality: Left;  . CYSTOSCOPY WITH RETROGRADE PYELOGRAM, URETEROSCOPY AND STENT PLACEMENT Left 06/24/2014   Procedure: CYSTOSCOPY WITH RETROGRADE PYELOGRAM,  AND STENT PLACEMENT;  Surgeon: Magdalene Molly, MD;  Location: [facility];  Service: Urology;  Laterality: Left;  . CYSTOSCOPY WITH RETROGRADE PYELOGRAM, URETEROSCOPY AND STENT PLACEMENT Left 07/05/2014   Procedure: CYSTO/LEFT URETEROSCOPY/LEFT RETROGRADE PYELOGRAM/LEFT STENT PLACEMENT;  Surgeon: Jerilee Field, MD;  Location: [facility];  Service: Urology;  Laterality: Left;  . CYSTOSCOPY WITH RETROGRADE PYELOGRAM, URETEROSCOPY AND STENT PLACEMENT Left 07/06/2015  Procedure: CYSTOSCOPY WITH RETROGRADE PYELOGRAM, URETEROSCOPY , LASER, STENT PLACEMENT and BASKET EXTRACTION;  Surgeon: Jerilee Field, MD;  Location: [facility];  Service: Urology;  Laterality: Left;  . CYSTOSCOPY WITH RETROGRADE PYELOGRAM, URETEROSCOPY AND STENT PLACEMENT Left  08/19/2015   Procedure: CYSTOSCOPY WITH RETROGRADE PYELOGRAM, URETEROSCOPY AND STENT PLACEMENT;  Surgeon: Jerilee Field, MD;  Location: [facility];  Service: Urology;  Laterality: Left;  . CYSTOSCOPY WITH RETROGRADE PYELOGRAM, URETEROSCOPY AND STENT PLACEMENT Left 03/13/2018   Procedure: CYSTOSCOPY WITH RETROGRADE PYELOGRAM, URETEROSCOPY AND LEFT STENT PLACEMENT;  Surgeon: Bjorn Pippin, MD;  Location: [facility];  Service: Urology;  Laterality: Left;  . CYSTOSCOPY WITH URETEROSCOPY AND STENT PLACEMENT Left 01/16/2016   Procedure: CYSTOSCOPY WITH LEFT RETROGRADE PYELOGRAM  LEFT DIGITAL URETEROSCOPY AND PLACEMENT LEFT URETERAL STENT;  Surgeon: Jerilee Field, MD;  Location: [facility];  Service: Urology;  Laterality: Left;  . CYSTOSCOPY WITH URETEROSCOPY, STONE BASKETRY AND STENT PLACEMENT Left 02/13/2016   Procedure: CYSTOSCOPY WITH LEFT URETEROSCOPY, HOLMIUM LASER AND STENT PLACEMENT;  Surgeon: Jerilee Field, MD;  Location: [facility];  Service: Urology;  Laterality: Left;  . CYSTOSCOPY/RETROGRADE/URETEROSCOPY/STONE EXTRACTION WITH BASKET Left 08/08/2015   Procedure: CYSTOSCOPY/RETROGRADE/URETEROSCOPY/STONE EXTRACTION WITH BASKET;  Surgeon: Jerilee Field, MD;  Location: [facility];  Service: Urology;  Laterality: Left;  . CYSTOSCOPY/URETEROSCOPY/HOLMIUM LASER/STENT PLACEMENT Left 07/30/2016   Procedure: CYSTOSCOPY/URETEROSCOPY/HOLMIUM LASER/STENT PLACEMENT;  Surgeon: Jerilee Field, MD;  Location: [facility];  Service: Urology;  Laterality: Left;  . CYSTOSCOPY/URETEROSCOPY/HOLMIUM LASER/STENT PLACEMENT Left 08/19/2017   Procedure: CYSTOSCOPY/URETEROSCOPY/HOLMIUM LASER/STENT PLACEMENT;  Surgeon: Jerilee Field, MD;  Location: [facility];  Service: Urology;  Laterality: Left;  . CYSTOSCOPY/URETEROSCOPY/HOLMIUM LASER/STENT PLACEMENT N/A 10/13/2018   Procedure: CYSTOSCOPY/RETROGRADE RIGHT URETEROSCOPY/ AND RIGHTSTENT PLACEMENT;  Surgeon: Bjorn Pippin, MD;  Location:  [facility];  Service: Urology;  Laterality: N/A;  . EXTRACORPOREAL SHOCK WAVE LITHOTRIPSY Left 07-07-2015  &  12-26-2014  . 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 HOLMIUM LASER APPLICATION Left 07/05/2014   Procedure: LASER LITHO;  Surgeon: Jerilee Field, MD;  Location: [facility];  Service: Urology;  Laterality: Left;  . HOLMIUM LASER APPLICATION Left 08/08/2015   Procedure: HOLMIUM LASER  WITH LITHOTRIPSY ;  Surgeon: Jerilee Field, MD;  Location: [facility];  Service: Urology;  Laterality: Left;  . HOLMIUM LASER APPLICATION Left 02/21/2017   Procedure: HOLMIUM LASER APPLICATION;  Surgeon: Bjorn Pippin, MD;  Location: [facility];  Service: Urology;  Laterality: Left;  . HOLMIUM LASER APPLICATION Left 03/13/2018   Procedure: HOLMIUM LASER APPLICATION;  Surgeon: Bjorn Pippin, MD;  Location: [facility];  Service: Urology;  Laterality: Left;  . LYMPH GLAND EXCISION  2003   neck--  benign  . SVT ABLATION N/A 05/22/2018   Procedure: SVT ABLATION;  Surgeon: Marinus Maw, MD;  Location: [facility] INVASIVE CV LAB;  Service: Cardiovascular;  Laterality: N/A;        Home Medications    Prior to Admission medications   Medication Sig Start Date End Date Taking? Authorizing Provider  acetaminophen (TYLENOL) 500 MG tablet Take 1,000 mg by mouth every 6 (six) hours as needed for moderate pain.   Yes [provider]  BOTOX 100 units SOLR injection INJECT IN THE MUSCLE TO HEAD AND NECK MUSCLES EVERY 3 MONTHS BY PROVIDER Patient taking differently: Inject 100 Units into the muscle every 3 (three) months.  06/06/17  Yes Sater, Pearletha Furl, MD  butalbital-acetaminophen-caffeine (FIORICET, ESGIC) [phone] MG  tablet TAKE 1 TABLET BY MOUTH EVERY 6 HOURS AS NEEDED FOR HEADACHE 10/12/18  Yes Sater, Pearletha Furl, MD  hydrochlorothiazide (HYDRODIURIL) 25 MG tablet Take 25 mg by mouth 2 (two) times daily.   Yes [provider]  lamoTRIgine (LAMICTAL) 100 MG tablet One po bid Patient taking differently: Take 100 mg by mouth 2 (two) times daily.  03/02/18  Yes Sater, Pearletha Furl, MD  levETIRAcetam (KEPPRA) 750 MG tablet TAKE 1 TABLET BY MOUTH EVERY MORNING AND 2 TABLETS EVERY NIGHT AT BEDTIME 03/02/18  Yes Sater, Pearletha Furl, MD  oxybutynin (DITROPAN) 5 MG tablet Take 5 mg by mouth every 8 (eight) hours as needed. Bladder spasms. 10/12/18  Yes [provider]  rizatriptan (MAXALT-MLT) 10 MG disintegrating tablet Take one tablet at onset of headache. May repeat once in 2 hours. Max of 2 tablets in 24 hours and 4 tablets per week 07/24/18  Yes Sater, Pearletha Furl, MD  tamsulosin (FLOMAX) 0.4 MG CAPS capsule Take 0.4 mg by mouth daily after supper.   Yes [provider]  amphetamine-dextroamphetamine (ADDERALL) 10 MG tablet Take one po qam then one po 4 hours later Patient not taking: Reported on 08/14/2018 04/07/18   Campbell Riches, NP  ondansetron (ZOFRAN ODT) 4 MG disintegrating tablet Take 1 tablet (4 mg total) by mouth every 8 (eight) hours as needed for nausea or vomiting. 10/22/18   Gerhard Munch, MD  oxyCODONE-acetaminophen (PERCOCET/ROXICET) 5-325 MG tablet Take 2 tablets by mouth every 4 (four) hours as needed for severe pain. 10/22/18   Gerhard Munch, MD    Family History Family History  Problem Relation Age of Onset  . Cancer Father   . Kidney Stones Mother   . Cancer Other   . Hypertension Other   . Hyperlipidemia Other   . Stroke Other   . Kidney Stones Brother     Social History Social History   Tobacco Use  . Smoking status: Former Smoker    Years: 2.00    Last attempt to quit: 07/29/2014    Years since quitting: 4.2  . Smokeless tobacco: Never Used  . Tobacco comment: social smoker  Substance Use Topics  . Alcohol use: Yes    Alcohol/week: 0.0 standard drinks    Comment: occ  . Drug use: No     Allergies   Bactrim [sulfamethoxazole-trimethoprim]; Codeine;  and Adhesive [tape]   Review of Systems Review of Systems  Constitutional:       Per HPI, otherwise negative  HENT:       Per HPI, otherwise negative  Respiratory:       Per HPI, otherwise negative  Cardiovascular:       Per HPI, otherwise negative  Gastrointestinal: Positive for abdominal pain and nausea.  Endocrine:       Negative aside from HPI  Genitourinary:       Neg aside from HPI   Musculoskeletal:       Per HPI, otherwise negative  Skin: Negative.   Neurological: Negative for syncope.     Physical Exam Updated Vital Signs BP 131/77   Pulse 66   Temp 97.9 F (36.6 C) (Oral)   Resp 16   Ht 5\' 9"  (1.753 m)   Wt 110.7 kg   SpO2 98%   BMI 36.03 kg/m   Physical Exam  Constitutional: He is oriented to person, place, and time. He appears well-developed. No distress.  Uncomfortable appearing young male awake and alert  HENT:  Head: Normocephalic and  atraumatic.  Eyes: Conjunctivae and EOM are normal.  Cardiovascular: Normal rate and regular rhythm.  Pulmonary/Chest: Effort normal. No stridor. No respiratory distress.  Abdominal: He exhibits no distension. There is tenderness. There is guarding.  Right lateral abdominal tenderness to palpation  Musculoskeletal: He exhibits no edema.  Neurological: He is alert and oriented to person, place, and time.  Skin: Skin is warm and dry.  Psychiatric: He has a normal mood and affect.  Nursing note and vitals reviewed.    ED Treatments / Results  Labs (all labs ordered are listed, but only abnormal results are displayed) Labs Reviewed  COMPREHENSIVE METABOLIC PANEL - Abnormal; Notable for the following components:      Result Value   Total Protein 8.2 (*)    All other components within normal limits  URINALYSIS, ROUTINE W REFLEX MICROSCOPIC - Abnormal; Notable for the following components:   Color, Urine RED (*)    APPearance CLOUDY (*)    Hgb urine dipstick LARGE (*)    Protein, ur 100 (*)    Leukocytes, UA  SMALL (*)    RBC / HPF >50 (*)    All other components within normal limits  CBC WITH DIFFERENTIAL/PLATELET    EKG None  Radiology Dg Abd 1 View  Result Date: 10/22/2018 CLINICAL DATA:  Right-sided flank pain EXAM: ABDOMEN - 1 VIEW COMPARISON:  10/13/2018 FINDINGS: Double-J ureteral stent is noted on the right. A stone is noted adjacent to the proximal aspect of the stent. This may be the etiology of the patient's underlying discomfort. Multiple nonobstructing renal stones are noted on left. Mild scoliosis is noted. Nonobstructive bowel gas pattern is seen. IMPRESSION: Right ureteral stent in place. The known proximal right ureteral stone is again seen adjacent to the proximal stent similar to that noted on the prior exam. This is likely the etiology of the patient's underlying discomfort. Electronically Signed   By: Alcide Clever M.D.   On: 10/22/2018 17:37    Procedures Procedures (including critical care time)  Medications Ordered in ED Medications  sodium chloride 0.9 % bolus 1,000 mL (0 mLs Intravenous Stopped 10/22/18 1841)  HYDROmorphone (DILAUDID) injection 1 mg (1 mg Intravenous Given 10/22/18 1723)  ondansetron (ZOFRAN) injection 4 mg (4 mg Intravenous Given 10/22/18 1723)  HYDROmorphone (DILAUDID) injection 1 mg (1 mg Intravenous Given 10/22/18 1846)     Initial Impression / Assessment and Plan / ED Course  I have reviewed the triage vital signs and the nursing notes.  Pertinent labs & imaging results that were available during my care of the patient were reviewed by me and considered in my medical decision making (see chart for details).    Initial evaluation I reviewed the patient's chart including documentation from operative procedure last week with stent placement due to 5 mm stone with hydro-nephrosis.  6:56 PM On repeat exam the patient remains awake, alert, hemodynamically unremarkable, afebrile. We discussed all findings and I reviewed the labs and x-ray. No  evidence for new infection, no complete obstruction. Pain is diminished, though not resolved entirely. With a lengthy conversation about expected follow-up, resolution of his pain, the patient is likely for additional analgesia here, was discharged with next day urology follow-up. Given the after mentioned absence of obstruction or infection, this is reasonable. Patient discharged in stable condition.  Final Clinical Impressions(s) / ED Diagnoses   Final diagnoses:  Nephrolithiasis    ED Discharge Orders         Ordered    oxyCODONE-acetaminophen (  PERCOCET/ROXICET) 5-325 MG tablet  Every 4 hours PRN     10/22/18 1855    ondansetron (ZOFRAN ODT) 4 MG disintegrating tablet  Every 8 hours PRN     10/22/18 1855           Gerhard Munch, MD 10/22/18 1857

## 2018-10-22 NOTE — ED Triage Notes (Signed)
RT flank pain since stent was placed last Friday.  Pain is worse today.  Was told by urology to come to ed for pain meds and fluids.

## 2018-10-22 NOTE — ED Notes (Signed)
Patient transported to X-ray 

## 2018-10-22 NOTE — Discharge Instructions (Signed)
Be sure to follow-up with your urologist tomorrow.  Return here for any concerning changes in your condition.

## 2018-10-27 DIAGNOSIS — N201 Calculus of ureter: Secondary | ICD-10-CM | POA: Diagnosis not present

## 2018-10-27 DIAGNOSIS — N200 Calculus of kidney: Secondary | ICD-10-CM | POA: Diagnosis not present

## 2018-10-27 DIAGNOSIS — N202 Calculus of kidney with calculus of ureter: Secondary | ICD-10-CM | POA: Diagnosis not present

## 2018-10-31 ENCOUNTER — Other Ambulatory Visit: Payer: Self-pay

## 2018-10-31 ENCOUNTER — Encounter (HOSPITAL_COMMUNITY): Payer: Self-pay | Admitting: Emergency Medicine

## 2018-10-31 ENCOUNTER — Emergency Department (HOSPITAL_COMMUNITY): Payer: BLUE CROSS/BLUE SHIELD

## 2018-10-31 ENCOUNTER — Emergency Department (HOSPITAL_COMMUNITY)
Admission: EM | Admit: 2018-10-31 | Discharge: 2018-10-31 | Disposition: A | Discharge status: Home / Self Care | Payer: BLUE CROSS/BLUE SHIELD | Attending: Emergency Medicine | Admitting: Emergency Medicine

## 2018-10-31 DIAGNOSIS — N390 Urinary tract infection, site not specified: Secondary | ICD-10-CM | POA: Diagnosis not present

## 2018-10-31 DIAGNOSIS — R109 Unspecified abdominal pain: Secondary | ICD-10-CM | POA: Diagnosis not present

## 2018-10-31 DIAGNOSIS — Z87891 Personal history of nicotine dependence: Secondary | ICD-10-CM | POA: Diagnosis not present

## 2018-10-31 DIAGNOSIS — N1339 Other hydronephrosis: Secondary | ICD-10-CM | POA: Diagnosis not present

## 2018-10-31 DIAGNOSIS — R1032 Left lower quadrant pain: Secondary | ICD-10-CM | POA: Diagnosis not present

## 2018-10-31 DIAGNOSIS — R319 Hematuria, unspecified: Secondary | ICD-10-CM | POA: Diagnosis not present

## 2018-10-31 DIAGNOSIS — Z79899 Other long term (current) drug therapy: Secondary | ICD-10-CM | POA: Insufficient documentation

## 2018-10-31 LAB — CBC WITH DIFFERENTIAL/PLATELET
Abs Immature Granulocytes: 0.04 10*3/uL (ref 0.00–0.07)
Basophils Absolute: 0.1 10*3/uL (ref 0.0–0.1)
Basophils Relative: 0 %
Eosinophils Absolute: 0.1 10*3/uL (ref 0.0–0.5)
Eosinophils Relative: 1 %
HCT: 43.8 % (ref 39.0–52.0)
Hemoglobin: 14.7 g/dL (ref 13.0–17.0)
Immature Granulocytes: 0 %
Lymphocytes Relative: 18 %
Lymphs Abs: 2 10*3/uL (ref 0.7–4.0)
MCH: 29.1 pg (ref 26.0–34.0)
MCHC: 33.6 g/dL (ref 30.0–36.0)
MCV: 86.7 fL (ref 80.0–100.0)
Monocytes Absolute: 1.1 10*3/uL — ABNORMAL HIGH (ref 0.1–1.0)
Monocytes Relative: 10 %
Neutro Abs: 7.9 10*3/uL — ABNORMAL HIGH (ref 1.7–7.7)
Neutrophils Relative %: 71 %
Platelets: 220 10*3/uL (ref 150–400)
RBC: 5.05 MIL/uL (ref 4.22–5.81)
RDW: 12.3 % (ref 11.5–15.5)
WBC: 11.2 10*3/uL — ABNORMAL HIGH (ref 4.0–10.5)
nRBC: 0 % (ref 0.0–0.2)

## 2018-10-31 LAB — URINALYSIS, ROUTINE W REFLEX MICROSCOPIC
Bacteria, UA: NONE SEEN
Bilirubin Urine: NEGATIVE
Glucose, UA: NEGATIVE mg/dL
Ketones, ur: NEGATIVE mg/dL
Nitrite: POSITIVE — AB
Protein, ur: 30 mg/dL — AB
RBC / HPF: >50 RBC/hpf — ABNORMAL HIGH (ref 0–5)
Specific Gravity, Urine: 1.024 (ref 1.005–1.030)
pH: 5 (ref 5.0–8.0)

## 2018-10-31 LAB — BASIC METABOLIC PANEL
Anion gap: 10 (ref 5–15)
BUN: 16 mg/dL (ref 6–20)
CO2: 24 mmol/L (ref 22–32)
Calcium: 9.2 mg/dL (ref 8.9–10.3)
Chloride: 102 mmol/L (ref 98–111)
Creatinine, Ser: 1.32 mg/dL — ABNORMAL HIGH (ref 0.61–1.24)
GFR calc Af Amer: >60 mL/min (ref >60)
GFR calc non Af Amer: >60 mL/min (ref >60)
Glucose, Bld: 112 mg/dL — ABNORMAL HIGH (ref 70–99)
Potassium: 3.5 mmol/L (ref 3.5–5.1)
Sodium: 136 mmol/L (ref 135–145)

## 2018-10-31 MED ORDER — KETOROLAC TROMETHAMINE 30 MG/ML IJ SOLN
30.0000 mg | Freq: Once | INTRAMUSCULAR | Status: AC
  Administered 2018-10-31: 30 mg via INTRAVENOUS
  Filled 2018-10-31: qty 1

## 2018-10-31 MED ORDER — FENTANYL CITRATE (PF) 100 MCG/2ML IJ SOLN
50.0000 ug | Freq: Once | INTRAMUSCULAR | Status: AC
  Administered 2018-10-31: 50 ug via INTRAVENOUS
  Filled 2018-10-31: qty 2

## 2018-10-31 MED ORDER — SODIUM CHLORIDE 0.9 % IV BOLUS
1000.0000 mL | Freq: Once | INTRAVENOUS | Status: AC
  Administered 2018-10-31: 1000 mL via INTRAVENOUS

## 2018-10-31 MED ORDER — SODIUM CHLORIDE 0.9 % IV SOLN
1.0000 g | Freq: Once | INTRAVENOUS | Status: AC
  Administered 2018-10-31: 1 g via INTRAVENOUS
  Filled 2018-10-31: qty 10

## 2018-10-31 MED ORDER — HYDROMORPHONE HCL 4 MG PO TABS: ORAL_TABLET | ORAL | 0 refills | Status: DC

## 2018-10-31 MED ORDER — KETOROLAC TROMETHAMINE 10 MG PO TABS: 10.0000 mg | ORAL_TABLET | Freq: Four times a day (QID) | ORAL | 0 refills | Status: DC | PRN

## 2018-10-31 MED ORDER — TAMSULOSIN HCL 0.4 MG PO CAPS: 0.4000 mg | ORAL_CAPSULE | Freq: Every day | ORAL | 0 refills | Status: DC

## 2018-10-31 MED ORDER — HYDROCODONE-ACETAMINOPHEN 5-325 MG PO TABS: 1.0000 | ORAL_TABLET | Freq: Four times a day (QID) | ORAL | 0 refills | Status: DC | PRN

## 2018-10-31 MED ORDER — MORPHINE SULFATE (PF) 4 MG/ML IV SOLN
6.0000 mg | Freq: Once | INTRAVENOUS | Status: AC
  Administered 2018-10-31: 6 mg via INTRAVENOUS
  Filled 2018-10-31: qty 2

## 2018-10-31 MED ORDER — HYDROMORPHONE HCL 1 MG/ML IJ SOLN
1.0000 mg | Freq: Once | INTRAMUSCULAR | Status: AC
  Administered 2018-10-31: 1 mg via INTRAVENOUS
  Filled 2018-10-31: qty 1

## 2018-10-31 MED ORDER — ONDANSETRON HCL 4 MG/2ML IJ SOLN
4.0000 mg | Freq: Once | INTRAMUSCULAR | Status: AC
  Administered 2018-10-31: 4 mg via INTRAVENOUS
  Filled 2018-10-31: qty 2

## 2018-10-31 MED ORDER — CEPHALEXIN 500 MG PO CAPS: 500.0000 mg | ORAL_CAPSULE | Freq: Three times a day (TID) | ORAL | 0 refills | Status: DC

## 2018-10-31 MED ORDER — MORPHINE SULFATE (PF) 4 MG/ML IV SOLN
4.0000 mg | Freq: Once | INTRAVENOUS | Status: AC
  Administered 2018-10-31: 4 mg via INTRAVENOUS
  Filled 2018-10-31: qty 1

## 2018-10-31 NOTE — ED Notes (Signed)
Pt aware that urine sample in needed,can't go right now.

## 2018-10-31 NOTE — ED Triage Notes (Signed)
Had ureter stents placed on Friday, took stents out this am, start having uncontrolled left flank pain, since 330 am, has taken tylenol, motrin, and oxybutin, without any relief.

## 2018-10-31 NOTE — ED Provider Notes (Signed)
[facility] EMERGENCY DEPARTMENT Provider Note   CSN: [id number] Arrival date & time: 10/31/18  0508  Time seen 05:50 AM   History   Chief Complaint Chief Complaint  Patient presents with  . Flank Pain    HPI Sheliah MendsRussell K Livers is a 27 y.o. male.  HPI patient reports he has a long history of kidney stones and has had about 20 procedures.  He had a right stent placed on October 25 by Dr. Annabell HowellsWrenn and he reports November 8 he had bilateral stents placed by his urologist Dr. Mena GoesEskridge.  He states he removed bilateral stones.  He removed his stents around midnight tonight.  However he states about 3:30 AM he started having severe left flank pain, he states he took a oxybutynin and Flomax.  He did not take his tramadol.  He last took it yesterday.  He is vomited about 6 times.  He denies hematuria or fever.  He states one other time before they removed his stent he had some blockage recur and had to have the stent placed again.  PCP Merlyn AlbertLuking, William S, MD Urology Dr Mena GoesEskridge  Past Medical History:  Diagnosis Date  . Complication of anesthesia   . Frequency-urgency syndrome   . History of kidney stones   . Hx of nausea and vomiting    d/t kidney stone  . Hx of sepsis 08/11/2017   due to kidney stone/ hydronephrosis  . Left ureteral stone   . Migraine   . Pain due to ureteral stent (HCC)   . PONV (postoperative nausea and vomiting)    none recently  . Renal calculi    bilateral per ct 07-26-2016  . Scoliosis of lumbar spine 1994   treated at [facility] until age 27  . Wears glasses     Patient Active Problem List   Diagnosis Date Noted  . Right ureteral stone 10/13/2018  . Acute pyelonephritis 08/14/2018  . Bilateral hydronephrosis 08/14/2018  . Sepsis (HCC) 08/14/2018  . SVT (supraventricular tachycardia) (HCC) 05/22/2018  . Attention deficit hyperactivity disorder (ADHD), combined type 04/08/2018  . Chronic migraine 02/14/2017  . Neck pain 02/04/2016  . Hydronephrosis, left  08/19/2015  . Hydronephrosis determined by ultrasound   . Kidney stone on left side 08/18/2015  . Nephrolithiasis 08/18/2015  . Kidney stone 07/06/2015  . Left ureteral stone 07/06/2015  . Analgesic rebound headache 06/05/2014  . Migraine headache without aura 05/30/2014    Past Surgical History:  Procedure Laterality Date  . CYSTOSCOPY W/ RETROGRADES Right 07/06/2015   Procedure: CYSTOSCOPY WITH RETROGRADE PYELOGRAM;  Surgeon: Jerilee FieldMatthew Eskridge, MD;  Location: [facility];  Service: Urology;  Laterality: Right;  . CYSTOSCOPY W/ URETERAL STENT PLACEMENT Left 08/08/2015   Procedure: CYSTOSCOPY WITH STENT REPLACEMENT;  Surgeon: Jerilee FieldMatthew Eskridge, MD;  [facility];  Service: Urology;  Laterality: Left;  . CYSTOSCOPY W/ URETERAL STENT PLACEMENT Left 02/21/2017   Procedure: CYSTOSCOPY WITH RETROGRADE PYELOGRAM/URETERAL LEFT STENT PLACEMENT WITH  LASER;  Surgeon: Bjorn PippinJohn Wrenn, MD;  Location: [facility];  Service: Urology;  Laterality: Left;  . CYSTOSCOPY W/ URETERAL STENT PLACEMENT Left 08/10/2017   Procedure: CYSTOSCOPY WITH RETROGRADE PYELOGRAM/URETERAL STENT PLACEMENT;  Surgeon: Heloise PurpuraBorden, Lester, MD;  Location: [facility];  Service: Urology;  Laterality: Left;  . CYSTOSCOPY WITH RETROGRADE PYELOGRAM, URETEROSCOPY AND STENT PLACEMENT Left 06/24/2014   Procedure: CYSTOSCOPY WITH RETROGRADE PYELOGRAM,  AND STENT PLACEMENT;  Surgeon: Magdalene Mollyaniel Y Woodruff, MD;  Location: [facility];  Service: Urology;  Laterality: Left;  . CYSTOSCOPY WITH  RETROGRADE PYELOGRAM, URETEROSCOPY AND STENT PLACEMENT Left 07/05/2014   Procedure: CYSTO/LEFT URETEROSCOPY/LEFT RETROGRADE PYELOGRAM/LEFT STENT PLACEMENT;  Surgeon: Jerilee Field, MD;  Location: [facility];  Service: Urology;  Laterality: Left;  . CYSTOSCOPY WITH RETROGRADE PYELOGRAM, URETEROSCOPY AND STENT PLACEMENT Left 07/06/2015   Procedure: CYSTOSCOPY WITH RETROGRADE PYELOGRAM, URETEROSCOPY , LASER, STENT PLACEMENT and BASKET EXTRACTION;  Surgeon:  Jerilee Field, MD;  Location: [facility];  Service: Urology;  Laterality: Left;  . CYSTOSCOPY WITH RETROGRADE PYELOGRAM, URETEROSCOPY AND STENT PLACEMENT Left 08/19/2015   Procedure: CYSTOSCOPY WITH RETROGRADE PYELOGRAM, URETEROSCOPY AND STENT PLACEMENT;  Surgeon: Jerilee Field, MD;  Location: [facility];  Service: Urology;  Laterality: Left;  . CYSTOSCOPY WITH RETROGRADE PYELOGRAM, URETEROSCOPY AND STENT PLACEMENT Left 03/13/2018   Procedure: CYSTOSCOPY WITH RETROGRADE PYELOGRAM, URETEROSCOPY AND LEFT STENT PLACEMENT;  Surgeon: Bjorn Pippin, MD;  Location: [facility];  Service: Urology;  Laterality: Left;  . CYSTOSCOPY WITH URETEROSCOPY AND STENT PLACEMENT Left 01/16/2016   Procedure: CYSTOSCOPY WITH LEFT RETROGRADE PYELOGRAM  LEFT DIGITAL URETEROSCOPY AND PLACEMENT LEFT URETERAL STENT;  Surgeon: Jerilee Field, MD;  Location: [facility];  Service: Urology;  Laterality: Left;  . CYSTOSCOPY WITH URETEROSCOPY, STONE BASKETRY AND STENT PLACEMENT Left 02/13/2016   Procedure: CYSTOSCOPY WITH LEFT URETEROSCOPY, HOLMIUM LASER AND STENT PLACEMENT;  Surgeon: Jerilee Field, MD;  Location: [facility];  Service: Urology;  Laterality: Left;  . CYSTOSCOPY/RETROGRADE/URETEROSCOPY/STONE EXTRACTION WITH BASKET Left 08/08/2015   Procedure: CYSTOSCOPY/RETROGRADE/URETEROSCOPY/STONE EXTRACTION WITH BASKET;  Surgeon: Jerilee Field, MD;  Location: [facility];  Service: Urology;  Laterality: Left;  . CYSTOSCOPY/URETEROSCOPY/HOLMIUM LASER/STENT PLACEMENT Left 07/30/2016   Procedure: CYSTOSCOPY/URETEROSCOPY/HOLMIUM LASER/STENT PLACEMENT;  Surgeon: Jerilee Field, MD;  Location: [facility];  Service: Urology;  Laterality: Left;  . CYSTOSCOPY/URETEROSCOPY/HOLMIUM LASER/STENT PLACEMENT Left 08/19/2017   Procedure: CYSTOSCOPY/URETEROSCOPY/HOLMIUM LASER/STENT PLACEMENT;  Surgeon: Jerilee Field, MD;  Location: [facility];  Service: Urology;  Laterality: Left;  .  CYSTOSCOPY/URETEROSCOPY/HOLMIUM LASER/STENT PLACEMENT N/A 10/13/2018   Procedure: CYSTOSCOPY/RETROGRADE RIGHT URETEROSCOPY/ AND RIGHTSTENT PLACEMENT;  Surgeon: Bjorn Pippin, MD;  Location: [facility];  Service: Urology;  Laterality: N/A;  . EXTRACORPOREAL SHOCK WAVE LITHOTRIPSY Left 07-07-2015  &  12-26-2014  . 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 HOLMIUM LASER APPLICATION Left 07/05/2014   Procedure: LASER LITHO;  Surgeon: Jerilee Field, MD;  Location: [facility];  Service: Urology;  Laterality: Left;  . HOLMIUM LASER APPLICATION Left 08/08/2015   Procedure: HOLMIUM LASER  WITH LITHOTRIPSY ;  Surgeon: Jerilee Field, MD;  [facility];  Service: Urology;  Laterality: Left;  . HOLMIUM LASER APPLICATION Left 02/21/2017   Procedure: HOLMIUM LASER APPLICATION;  Surgeon: Bjorn Pippin, MD;  Location: [facility];  Service: Urology;  Laterality: Left;  . HOLMIUM LASER APPLICATION Left 03/13/2018   Procedure: HOLMIUM LASER APPLICATION;  Surgeon: Bjorn Pippin, MD;  Location: [facility];  Service: Urology;  Laterality: Left;  . LYMPH GLAND EXCISION  2003   neck--  benign  . SVT ABLATION N/A 05/22/2018   Procedure: SVT ABLATION;  Surgeon: Marinus Maw, MD;  Location: [facility];  Service: Cardiovascular;  Laterality: N/A;        Home Medications    Prior to Admission medications   Medication Sig Start Date End Date Taking? Authorizing Provider  acetaminophen (TYLENOL) 500 MG tablet Take 1,000 mg by mouth every 6 (six) hours as needed for moderate pain.   Yes [provider]  butalbital-acetaminophen-caffeine (FIORICET, ESGIC) 50-325-40 MG tablet TAKE 1 TABLET BY MOUTH EVERY 6 HOURS AS NEEDED FOR HEADACHE 10/12/18  Yes Sater, Pearletha Furl, MD  hydrochlorothiazide (HYDRODIURIL) 25 MG tablet Take 25 mg by mouth 2 (two) times daily.   Yes [provider]  lamoTRIgine (LAMICTAL) 100 MG  tablet One po bid Patient taking differently: Take 100 mg by mouth 2 (two) times daily.  03/02/18  Yes Sater, Pearletha Furl, MD  levETIRAcetam (KEPPRA) 750 MG tablet TAKE 1 TABLET BY MOUTH EVERY MORNING AND 2 TABLETS EVERY NIGHT AT BEDTIME 03/02/18  Yes Sater, Pearletha Furl, MD  ondansetron (ZOFRAN ODT) 4 MG disintegrating tablet Take 1 tablet (4 mg total) by mouth every 8 (eight) hours as needed for nausea or vomiting. 10/22/18  Yes Gerhard Munch, MD  oxybutynin (DITROPAN) 5 MG tablet Take 5 mg by mouth every 8 (eight) hours as needed. Bladder spasms. 10/12/18  Yes [provider]  rizatriptan (MAXALT-MLT) 10 MG disintegrating tablet Take one tablet at onset of headache. May repeat once in 2 hours. Max of 2 tablets in 24 hours and 4 tablets per week 07/24/18  Yes Sater, Pearletha Furl, MD  amphetamine-dextroamphetamine (ADDERALL) 10 MG tablet Take one po qam then one po 4 hours later 04/07/18   Campbell Riches, NP  BOTOX 100 units SOLR injection INJECT IN THE MUSCLE TO HEAD AND NECK MUSCLES EVERY 3 MONTHS BY PROVIDER Patient taking differently: Inject 100 Units into the muscle every 3 (three) months.  06/06/17   Sater, Pearletha Furl, MD  HYDROcodone-acetaminophen (NORCO/VICODIN) 5-325 MG tablet Take 1 tablet by mouth every 6 (six) hours as needed for moderate pain or severe pain. 10/31/18   Devoria Albe, MD  ketorolac (TORADOL) 10 MG tablet Take 1 tablet (10 mg total) by mouth every 6 (six) hours as needed. 10/31/18   Devoria Albe, MD  oxyCODONE-acetaminophen (PERCOCET/ROXICET) 5-325 MG tablet Take 2 tablets by mouth every 4 (four) hours as needed for severe pain. 10/22/18   Gerhard Munch, MD  tamsulosin (FLOMAX) 0.4 MG CAPS capsule Take 1 capsule (0.4 mg total) by mouth daily. 10/31/18   Devoria Albe, MD    Family History Family History  Problem Relation Age of Onset  . Cancer Father   . Kidney Stones Mother   . Cancer Other   . Hypertension Other   . Hyperlipidemia Other   . Stroke Other   . Kidney  Stones Brother     Social History Social History   Tobacco Use  . Smoking status: Former Smoker    Years: 2.00    Last attempt to quit: 07/29/2014    Years since quitting: 4.2  . Smokeless tobacco: Never Used  . Tobacco comment: social smoker  Substance Use Topics  . Alcohol use: Yes    Alcohol/week: 0.0 standard drinks    Comment: occ  . 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    Physical Exam Updated Vital Signs BP (!) 148/100   Pulse (!) 106   Temp 98.1 F (36.7 C) (Oral)   Resp 16   Ht 5\' 9"  (1.753 m)   Wt 110.7 kg   SpO2 96%   BMI 36.03 kg/m   Vital signs normal except for hypertension and tachycardia   Physical Exam  Constitutional: He is oriented to person, place, and time.  He appears well-developed and well-nourished. He appears distressed.  HENT:  Head: Normocephalic and atraumatic.  Right Ear: External ear normal.  Left Ear: External ear normal.  Nose: Nose normal.  Eyes: Conjunctivae and EOM are normal.  Neck: Normal range of motion.  Cardiovascular: Normal rate.  Pulmonary/Chest: Effort normal. No respiratory distress.  Abdominal:  Patient has left CVA tenderness to percussion  Musculoskeletal: Normal range of motion.  Neurological: He is alert and oriented to person, place, and time. No cranial nerve deficit.  Skin: Skin is warm and dry. No rash noted.  Psychiatric: He has a normal mood and affect. His behavior is normal. Thought content normal.     ED Treatments / Results  Labs (all labs ordered are listed, but only abnormal results are displayed) Results for orders placed or performed during the hospital encounter of 10/31/18  Urinalysis, Routine w reflex microscopic  Result Value Ref Range   Color, Urine AMBER (A) YELLOW   APPearance HAZY (A) CLEAR   Specific  Gravity, Urine 1.024 1.005 - 1.030   pH 5.0 5.0 - 8.0   Glucose, UA NEGATIVE NEGATIVE mg/dL   Hgb urine dipstick LARGE (A) NEGATIVE   Bilirubin Urine NEGATIVE NEGATIVE   Ketones, ur NEGATIVE NEGATIVE mg/dL   Protein, ur 30 (A) NEGATIVE mg/dL   Nitrite POSITIVE (A) NEGATIVE   Leukocytes, UA TRACE (A) NEGATIVE   RBC / HPF >50 (H) 0 - 5 RBC/hpf   WBC, UA 21-50 0 - 5 WBC/hpf   Bacteria, UA NONE SEEN NONE SEEN   Mucus PRESENT    Budding Yeast PRESENT   Basic metabolic panel  Result Value Ref Range   Sodium 136 135 - 145 mmol/L   Potassium 3.5 3.5 - 5.1 mmol/L   Chloride 102 98 - 111 mmol/L   CO2 24 22 - 32 mmol/L   Glucose, Bld 112 (H) 70 - 99 mg/dL   BUN 16 6 - 20 mg/dL   Creatinine, Ser 1.61 (H) 0.61 - 1.24 mg/dL   Calcium 9.2 8.9 - 09.6 mg/dL   GFR calc non Af Amer >60 >60 mL/min   GFR calc Af Amer >60 >60 mL/min   Anion gap 10 5 - 15  CBC with Differential  Result Value Ref Range   WBC 11.2 (H) 4.0 - 10.5 K/uL   RBC 5.05 4.22 - 5.81 MIL/uL   Hemoglobin 14.7 13.0 - 17.0 g/dL   HCT 04.5 40.9 - 81.1 %   MCV 86.7 80.0 - 100.0 fL   MCH 29.1 26.0 - 34.0 pg   MCHC 33.6 30.0 - 36.0 g/dL   RDW 91.4 78.2 - 95.6 %   Platelets 220 150 - 400 K/uL   nRBC 0.0 0.0 - 0.2 %   Neutrophils Relative % 71 %   Neutro Abs 7.9 (H) 1.7 - 7.7 K/uL   Lymphocytes Relative 18 %   Lymphs Abs 2.0 0.7 - 4.0 K/uL   Monocytes Relative 10 %   Monocytes Absolute 1.1 (H) 0.1 - 1.0 K/uL   Eosinophils Relative 1 %   Eosinophils Absolute 0.1 0.0 - 0.5 K/uL   Basophils Relative 0 %   Basophils Absolute 0.1 0.0 - 0.1 K/uL   Immature Granulocytes 0 %   Abs Immature Granulocytes 0.04 0.00 - 0.07 K/uL   Laboratory interpretation all normal except for new mild renal insufficiency, mild leukocytosis, probable UTI, urine culture sent    EKG None  Radiology No results found.  Procedures Procedures (including critical care time)  Medications Ordered in ED Medications  sodium chloride 0.9 % bolus 1,000  mL (has no administration in time range)  ketorolac (TORADOL) 30 MG/ML injection 30 mg (30 mg Intravenous Given 10/31/18 0620)  fentaNYL (SUBLIMAZE) injection 50 mcg (50 mcg Intravenous Given 10/31/18 0621)  ondansetron (ZOFRAN) injection 4 mg (4 mg Intravenous Given 10/31/18 0619)  cefTRIAXone (ROCEPHIN) 1 g in sodium chloride 0.9 % 100 mL IVPB (1 g Intravenous New Bag/Given 10/31/18 0717)  morphine 4 MG/ML injection 4 mg (4 mg Intravenous Given 10/31/18 1610)     Initial Impression / Assessment and Plan / ED Course  I have reviewed the triage vital signs and the nursing notes.  Pertinent labs & imaging results that were available during my care of the patient were reviewed by me and considered in my medical decision making (see chart for details).   When I look in his chart I can see the report from the procedure done in October but not the one that was done recently by Dr. Mena Goes.  PT was given IV pain and nausea medication. He has had several Abd/pelvis or renal CT's including August 2019 and March 2019.  I discussed with him getting a renal ultrasound to cut down on the radiation he has had.  I see at least 10 CT scans of the abdomen and several CT scans of the chest.  After reviewing patient's urinalysis he was given Rocephin IV for possible UTI.  His repeat temperature was still normal at 98.4.  Patient did get some relief with the fentanyl however when I rechecked him at 7:10 AM he states the pain is returning.  He states "they usually give me Dilaudid".  Patient was given morphine IV.  We discussed that his urine was consistent of UTI.  He was given Rocephin IV.  He is waiting for ultrasound to do his renal ultrasound.  7:50 AM patient was discussed with Dr. Marlou Porch, urology.  He states to hydrate the patient for his elevation of his creatinine.  We looked at his urine cultures, it was negative in October however in October he had more than 100,000 colonies of staph aureus  coagulase-negative species and he recommends sending patient home on Bactrim for 7 days.  He said to give the patient Toradol which I had already done.  He states that in his situation he would send the patient home on a nonsteroidal anti-inflammatory drug.  He also states to give him Flomax which patient already took at home but he would like him to be discharged home on the Flomax also.  He states that if that does not control his pain that he will probably need another stent.  He can either be seen by Dr. Retta Diones who is in [place] today in the office or he can go to the office if he is discharged from the ED if things get worse.  08:00 AM pt left with Dr Estell Harpin at change of shift to get [place] result.   View of the [facility] database shows patient got #12 tramadol 50 mg tablets on November 8, #16 oxycodone 5/325 on November 5, #15 oxycodone 5/325 on November 3, #20 oxycodone 5/325 on October 28, #10 oxycodone 5/325 on October 25.  Final Clinical Impressions(s) / ED Diagnoses   Final diagnoses:  Left flank pain  Urinary tract infection with hematuria, site unspecified    ED Discharge Orders         Ordered    ketorolac (TORADOL) 10 MG tablet  Every  6 hours PRN     10/31/18 0756    tamsulosin (FLOMAX) 0.4 MG CAPS capsule  Daily     10/31/18 0756    HYDROcodone-acetaminophen (NORCO/VICODIN) 5-325 MG tablet  Every 6 hours PRN     10/31/18 0756          Disposition pending  Devoria Albe, MD, Concha Pyo, MD 10/31/18 [phone]

## 2018-10-31 NOTE — Discharge Instructions (Signed)
Follow-up with your urologist this week call for an appointment

## 2018-11-01 LAB — URINE CULTURE
Culture: NO GROWTH
Special Requests: NORMAL

## 2018-11-03 ENCOUNTER — Telehealth: Payer: Self-pay | Admitting: Neurology

## 2018-11-03 ENCOUNTER — Other Ambulatory Visit: Payer: Self-pay | Admitting: Neurology

## 2018-11-03 NOTE — Telephone Encounter (Signed)
Kara Meadmma would you send a new script for the patients Botox to [facility] in [place]?

## 2018-11-06 NOTE — Telephone Encounter (Signed)
Noted, thank you

## 2018-11-07 NOTE — Telephone Encounter (Signed)
Pending. DW  °

## 2018-11-08 NOTE — Telephone Encounter (Signed)
Pending. DW

## 2018-11-09 ENCOUNTER — Encounter: Payer: Self-pay | Admitting: Neurology

## 2018-11-09 ENCOUNTER — Telehealth: Payer: Self-pay | Admitting: Neurology

## 2018-11-09 ENCOUNTER — Ambulatory Visit (INDEPENDENT_AMBULATORY_CARE_PROVIDER_SITE_OTHER): Payer: BLUE CROSS/BLUE SHIELD | Admitting: Neurology

## 2018-11-09 ENCOUNTER — Other Ambulatory Visit: Payer: Self-pay

## 2018-11-09 VITALS — BP 123/78 | HR 100 | Ht 69.0 in | Wt 250.0 lb

## 2018-11-09 DIAGNOSIS — T3995XA Adverse effect of unspecified nonopioid analgesic, antipyretic and antirheumatic, initial encounter: Secondary | ICD-10-CM

## 2018-11-09 DIAGNOSIS — G43709 Chronic migraine without aura, not intractable, without status migrainosus: Secondary | ICD-10-CM

## 2018-11-09 DIAGNOSIS — G4440 Drug-induced headache, not elsewhere classified, not intractable: Secondary | ICD-10-CM

## 2018-11-09 DIAGNOSIS — M542 Cervicalgia: Secondary | ICD-10-CM

## 2018-11-09 DIAGNOSIS — IMO0002 Reserved for concepts with insufficient information to code with codable children: Secondary | ICD-10-CM

## 2018-11-09 MED ORDER — ERENUMAB-AOOE 140 MG/ML ~~LOC~~ SOAJ: 140.0000 mg | SUBCUTANEOUS | 4 refills | Status: DC

## 2018-11-09 NOTE — Telephone Encounter (Signed)
3 mo btx °

## 2018-11-09 NOTE — Progress Notes (Signed)
[facility]  PATIENT: Thomas Howell DOB: [birth year]  REFERRING DOCTOR OR PCP:  Ardyth Gal SOURCE: patient, records in EMR  _________________________________   HISTORICAL  CHIEF COMPLAINT:  Chief Complaint  Patient presents with  . Botulinum Toxin Injection    RM 12, alone. 200Ux1 vial. Lot: [id number], expiration: 04/2021, NDC: 4540-9811-91. Doing well on botox but noticed increase in migraines recently. Wanting to discuss changing to once monthly inj such as Aimovig.    HISTORY OF PRESENT ILLNESS:  Thomas Howell  is a 27 y.o. man with chronic migraines  Update 11/09/2018: He had about 3 migraines a week the last month but was having about 1/week the first 3 months of the Botox (he is about 6 weeks late).    Migraines are usually right sided.    He gets nausea and moving worsens the pain.   Also has photophobia/phonophobia.    Maxalt/Aleve helps some when pain occurs but does not always knock the migraine out.    He wanted to discuss the anti-CGRP medications and we discussed the risks and benefits.     He denies any recent arrhythmias since his ablation for PSVT.    Update 06/07/2018: He continues to get a good benefit form the Botox.   He is now only haivng about 4 migraines a month and a triptan will usually eliminate the migraine after 30 minutes.  While he was hospitalized for a PSVT, he had Fioricet and felt it worked better than the Maxalt.  When he gets a migraine he will usually have nausea, photophobia and phonophobia.  Moving will worsen the headache.  He gets neck pain associated with the migraines and sometimes independently.  Pain is usually worse on the right.  He has PSVT and is undergoing an ablation soon.      Update 03/02/2018: He continues to receive a benefit form Botox --- the benefit has been less the last month but he has not had botox for 4 1/2 months.  He has less than one a week during the first 3 months of Botox.  They are  treated with good benefit using Maxalt.Marland Kitchen    He tolerates the Botox injections well  Besides Botox, he is also on Keppra and lamotrigine and feels that they help some.      From 10/19/2017: Since his last Botox 3 months ago, he reports a couple of headaches a month that are usually easy to eat with Maxalt. Last month he had 4 headaches.   He has had frequent migraine headaches since age 56.  Headaches have persisted despite multiple prophylactic regimens.    He started Botox 02/2017.   For the first 10 weeks, he had less than one migraine a week but has had a few the past 2 weeks.   When present, Tylenol sometimes helps and Maxalt helps if the Tylenol does not.   His neck pain also improved with the Botox.  Chronic Migraine History:   He was experiencing 20-24 headache days of lasting > 4 hours each day.   He also has milder occipital headache pain every day (30/30 a month).     A typical headache starts in the occiput and then radiates to the eye (usually on the right) with a pounding quality.      He has no preceding aura.   He gets nausea and vomiting.   Moving increases the headache and laying in bed in a dark quiet room helps.    He has associated  phonophobia and photophobia. Most headaches are random but some are triggered by strong smells such as perfume.     Maxalt helps some of the migraine headaches.  Often he has to take 2 over the day to get rid of the headache .   He uses about 10/month often in combination with Aleve.   Phenergan helps nausea    If he falls asleep, pain is oftebetter upon awakening.      HA treatment History:  Topiramate had some benefit but cause kidney stones and he needed to stop. Amitriptyline caused grogginess the next day.   Nortriptyline had not helped.    Keppra and lamotriine did not help the headache frequency much.  Aleve and other NSAID's by themself have not helped.      REVIEW OF SYSTEMS: Constitutional: No fevers, chills, sweats, or change in  appetite Eyes: No visual changes, double vision, eye pain Ear, nose and throat: No hearing loss, ear pain, nasal congestion, sore throat Cardiovascular: No chest pain, palpitations Respiratory: No shortness of breath at rest or with exertion.   No wheezes GastrointestinaI: No nausea, vomiting, diarrhea, abdominal pain, fecal incontinence Genitourinary: No dysuria, urinary retention or frequency.  He has frequent kidney stones (sees Dr. Mena Goes at [facility]) Musculoskeletal: No neck pain, back pain Integumentary: No rash, pruritus, skin lesions Neurological: as above Psychiatric: No depression at this time.  No anxiety Endocrine: No palpitations, diaphoresis, change in appetite, change in weigh or increased thirst Hematologic/Lymphatic: No anemia, purpura, petechiae. Allergic/Immunologic: No itchy/runny eyes, nasal congestion, recent allergic reactions, rashes  ALLERGIES: Allergies  Allergen Reactions  . Bactrim [Sulfamethoxazole-Trimethoprim] Nausea And Vomiting  . Codeine Swelling    Specifically cough syrup w/ codeine cause lip swelling  . Adhesive [Tape] Rash    Paper tape ok    HOME MEDICATIONS:  Current Outpatient Medications:  .  acetaminophen (TYLENOL) 500 MG tablet, Take 1,000 mg by mouth every 6 (six) hours as needed for moderate pain., Disp: , Rfl:  .  BOTOX 100 units SOLR injection, PHYSICIAN TO INJECT 155 UNITS IN THE MUSCLE EVERY 3 MONTHS, Disp: 2 each, Rfl: 2 .  butalbital-acetaminophen-caffeine (FIORICET, ESGIC) 50-325-40 MG tablet, TAKE 1 TABLET BY MOUTH EVERY 6 HOURS AS NEEDED FOR HEADACHE, Disp: 12 tablet, Rfl: 3 .  hydrochlorothiazide (HYDRODIURIL) 25 MG tablet, Take 25 mg by mouth 2 (two) times daily., Disp: , Rfl:  .  lamoTRIgine (LAMICTAL) 100 MG tablet, One po bid (Patient taking differently: Take 100 mg by mouth 2 (two) times daily. ), Disp: 180 tablet, Rfl: 3 .  levETIRAcetam (KEPPRA) 750 MG tablet, TAKE 1 TABLET BY MOUTH EVERY MORNING AND 2  TABLETS EVERY NIGHT AT BEDTIME, Disp: 270 tablet, Rfl: 3 .  ondansetron (ZOFRAN ODT) 4 MG disintegrating tablet, Take 1 tablet (4 mg total) by mouth every 8 (eight) hours as needed for nausea or vomiting., Disp: 20 tablet, Rfl: 0 .  rizatriptan (MAXALT-MLT) 10 MG disintegrating tablet, Take one tablet at onset of headache. May repeat once in 2 hours. Max of 2 tablets in 24 hours and 4 tablets per week, Disp: 10 tablet, Rfl: 0 .  Erenumab-aooe (AIMOVIG) 140 MG/ML SOAJ, Inject 140 mg into the skin every 28 (twenty-eight) days., Disp: 3 pen, Rfl: 4  PAST MEDICAL HISTORY: Past Medical History:  Diagnosis Date  . Complication of anesthesia   . Frequency-urgency syndrome   . History of kidney stones   . Hx of nausea and vomiting    d/t kidney stone  .  Hx of sepsis 08/11/2017   due to kidney stone/ hydronephrosis  . Left ureteral stone   . Migraine   . Pain due to ureteral stent (HCC)   . PONV (postoperative nausea and vomiting)    none recently  . Renal calculi    bilateral per ct 07-26-2016  . Scoliosis of lumbar spine 1994   treated at [facility] until age 6  . Wears glasses     PAST SURGICAL HISTORY: Past Surgical History:  Procedure Laterality Date  . CYSTOSCOPY W/ RETROGRADES Right 07/06/2015   Procedure: CYSTOSCOPY WITH RETROGRADE PYELOGRAM;  Surgeon: Jerilee Field, MD;  Location: [facility];  Service: Urology;  Laterality: Right;  . CYSTOSCOPY W/ URETERAL STENT PLACEMENT Left 08/08/2015   Procedure: CYSTOSCOPY WITH STENT REPLACEMENT;  Surgeon: Jerilee Field, MD;  Location: [facility];  Service: Urology;  Laterality: Left;  . CYSTOSCOPY W/ URETERAL STENT PLACEMENT Left 02/21/2017   Procedure: CYSTOSCOPY WITH RETROGRADE PYELOGRAM/URETERAL LEFT STENT PLACEMENT WITH  LASER;  Surgeon: Bjorn Pippin, MD;  Location: [facility];  Service: Urology;  Laterality: Left;  . CYSTOSCOPY W/ URETERAL STENT PLACEMENT Left 08/10/2017   Procedure: CYSTOSCOPY WITH RETROGRADE PYELOGRAM/URETERAL  STENT PLACEMENT;  Surgeon: Heloise Purpura, MD;  Location: [facility];  Service: Urology;  Laterality: Left;  . CYSTOSCOPY WITH RETROGRADE PYELOGRAM, URETEROSCOPY AND STENT PLACEMENT Left 06/24/2014   Procedure: CYSTOSCOPY WITH RETROGRADE PYELOGRAM,  AND STENT PLACEMENT;  Surgeon: Magdalene Molly, MD;  Location: [facility];  Service: Urology;  Laterality: Left;  . CYSTOSCOPY WITH RETROGRADE PYELOGRAM, URETEROSCOPY AND STENT PLACEMENT Left 07/05/2014   Procedure: CYSTO/LEFT URETEROSCOPY/LEFT RETROGRADE PYELOGRAM/LEFT STENT PLACEMENT;  Surgeon: Jerilee Field, MD;  Location: [facility];  Service: Urology;  Laterality: Left;  . CYSTOSCOPY WITH RETROGRADE PYELOGRAM, URETEROSCOPY AND STENT PLACEMENT Left 07/06/2015   Procedure: CYSTOSCOPY WITH RETROGRADE PYELOGRAM, URETEROSCOPY , LASER, STENT PLACEMENT and BASKET EXTRACTION;  Surgeon: Jerilee Field, MD;  Location: [facility];  Service: Urology;  Laterality: Left;  . CYSTOSCOPY WITH RETROGRADE PYELOGRAM, URETEROSCOPY AND STENT PLACEMENT Left 08/19/2015   Procedure: CYSTOSCOPY WITH RETROGRADE PYELOGRAM, URETEROSCOPY AND STENT PLACEMENT;  Surgeon: Jerilee Field, MD;  Location: [facility];  Service: Urology;  Laterality: Left;  . CYSTOSCOPY WITH RETROGRADE PYELOGRAM, URETEROSCOPY AND STENT PLACEMENT Left 03/13/2018   Procedure: CYSTOSCOPY WITH RETROGRADE PYELOGRAM, URETEROSCOPY AND LEFT STENT PLACEMENT;  Surgeon: Bjorn Pippin, MD;  Location: [facility];  Service: Urology;  Laterality: Left;  . CYSTOSCOPY WITH URETEROSCOPY AND STENT PLACEMENT Left 01/16/2016   Procedure: CYSTOSCOPY WITH LEFT RETROGRADE PYELOGRAM  LEFT DIGITAL URETEROSCOPY AND PLACEMENT LEFT URETERAL STENT;  Surgeon: Jerilee Field, MD;  Location: [facility];  Service: Urology;  Laterality: Left;  . CYSTOSCOPY WITH URETEROSCOPY, STONE BASKETRY AND STENT PLACEMENT Left 02/13/2016   Procedure: CYSTOSCOPY WITH LEFT URETEROSCOPY, HOLMIUM LASER AND STENT PLACEMENT;  Surgeon: Jerilee Field, MD;  Location:  [facility];  Service: Urology;  Laterality: Left;  . CYSTOSCOPY/RETROGRADE/URETEROSCOPY/STONE EXTRACTION WITH BASKET Left 08/08/2015   Procedure: CYSTOSCOPY/RETROGRADE/URETEROSCOPY/STONE EXTRACTION WITH BASKET;  Surgeon: Jerilee Field, MD;  Location: [facility];  Service: Urology;  Laterality: Left;  . CYSTOSCOPY/URETEROSCOPY/HOLMIUM LASER/STENT PLACEMENT Left 07/30/2016   Procedure: CYSTOSCOPY/URETEROSCOPY/HOLMIUM LASER/STENT PLACEMENT;  Surgeon: Jerilee Field, MD;  Location: [facility];  Service: Urology;  Laterality: Left;  . CYSTOSCOPY/URETEROSCOPY/HOLMIUM LASER/STENT PLACEMENT Left 08/19/2017   Procedure: CYSTOSCOPY/URETEROSCOPY/HOLMIUM LASER/STENT PLACEMENT;  Surgeon: Jerilee Field, MD;  Location: [facility];  Service: Urology;  Laterality: Left;  . CYSTOSCOPY/URETEROSCOPY/HOLMIUM LASER/STENT PLACEMENT N/A 10/13/2018   Procedure: CYSTOSCOPY/RETROGRADE  RIGHT URETEROSCOPY/ AND RIGHTSTENT PLACEMENT;  Surgeon: Bjorn Pippin, MD;  Location: [facility];  Service: Urology;  Laterality: N/A;  . EXTRACORPOREAL SHOCK WAVE LITHOTRIPSY Left 07-07-2015  &  12-26-2014  . 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 HOLMIUM LASER APPLICATION Left 07/05/2014   Procedure: LASER LITHO;  Surgeon: Jerilee Field, MD;  Location: [facility];  Service: Urology;  Laterality: Left;  . HOLMIUM LASER APPLICATION Left 08/08/2015   Procedure: HOLMIUM LASER  WITH LITHOTRIPSY ;  Surgeon: Jerilee Field, MD;  Location: [facility];  Service: Urology;  Laterality: Left;  . HOLMIUM LASER APPLICATION Left 02/21/2017   Procedure: HOLMIUM LASER APPLICATION;  Surgeon: Bjorn Pippin, MD;  Location: [facility];  Service: Urology;  Laterality: Left;  . HOLMIUM LASER APPLICATION Left 03/13/2018   Procedure: HOLMIUM LASER APPLICATION;  Surgeon: Bjorn Pippin, MD;  Location: [facility];  Service: Urology;   Laterality: Left;  . LYMPH GLAND EXCISION  2003   neck--  benign  . SVT ABLATION N/A 05/22/2018   Procedure: SVT ABLATION;  Surgeon: Marinus Maw, MD;  Location: [facility];  Service: Cardiovascular;  Laterality: N/A;    FAMILY HISTORY: Family History  Problem Relation Age of Onset  . Cancer Father   . Kidney Stones Mother   . Cancer Other   . Hypertension Other   . Hyperlipidemia Other   . Stroke Other   . Kidney Stones Brother     SOCIAL HISTORY:  Social History   Socioeconomic History  . Marital status: Single    Spouse name: Not on file  . Number of children: Not on file  . Years of education: Not on file  . Highest education level: Not on file  Occupational History  . Not on file  Social Needs  . Financial resource strain: Not on file  . Food insecurity:    Worry: Not on file    Inability: Not on file  . Transportation needs:    Medical: Not on file    Non-medical: Not on file  Tobacco Use  . Smoking status: Former Smoker    Years: 2.00    Last attempt to quit: 07/29/2014    Years since quitting: 4.2  . Smokeless tobacco: Never Used  . Tobacco comment: social smoker  Substance and Sexual Activity  . Alcohol use: Yes    Alcohol/week: 0.0 standard drinks    Comment: occ  . Drug use: No  . Sexual activity: Never  Lifestyle  . Physical activity:    Days per week: Not on file    Minutes per session: Not on file  . Stress: Not on file  Relationships  . Social connections:    Talks on phone: Not on file    Gets together: Not on file    Attends religious service: Not on file    Active member of club or organization: Not on file    Attends meetings of clubs or organizations: Not on file    Relationship status: Not on file  . Intimate partner violence:    Fear of current or ex partner: Not on file    Emotionally abused: Not on file    Physically abused: Not on file    Forced sexual activity: Not on file  Other Topics Concern  . Not on file   Social History Narrative  . Not on file     PHYSICAL EXAM  Vitals:  11/09/18 1359  BP: 123/78  Pulse: 100  Weight: 250 lb (113.4 kg)  Height: 5\' 9"  (1.753 m)    Body mass index is 36.92 kg/m.   General: The patient is well-developed and well-nourished and in no acute distress  Neck:   There is mild tenderness over the right splenius capitis muscle .  Neurologic Exam  Mental status: The patient is alert and oriented x 3 at the time of the examination. The patient has apparent normal recent and remote memory, with an apparently normal attention span and concentration ability.   Speech is normal.  Cranial nerves: Extraocular muscles are intact.  There is no ptosis.  Good facial strength.  Symmetric smile and eyebrow raise     Motor:  Muscle bulk is normal.   Muscle tone and strength are normal  Gait and station: Station is normal.   Normal gait.           DIAGNOSTIC DATA (LABS, IMAGING, TESTING) - I reviewed patient records, labs, notes, testing and imaging myself where available.  Lab Results  Component Value Date   WBC 11.2 (H) 10/31/2018   HGB 14.7 10/31/2018   HCT 43.8 10/31/2018   MCV 86.7 10/31/2018   PLT 220 10/31/2018      Component Value Date/Time   NA 136 10/31/2018 0623   K 3.5 10/31/2018 0623   CL 102 10/31/2018 0623   CO2 24 10/31/2018 0623   GLUCOSE 112 (H) 10/31/2018 0623   BUN 16 10/31/2018 0623   CREATININE 1.32 (H) 10/31/2018 0623   CALCIUM 9.2 10/31/2018 0623   PROT 8.2 (H) 10/22/2018 1730   ALBUMIN 4.4 10/22/2018 1730   AST 26 10/22/2018 1730   ALT 25 10/22/2018 1730   ALKPHOS 71 10/22/2018 1730   BILITOT 0.7 10/22/2018 1730   GFRNONAA >60 10/31/2018 0623   GFRAA >60 10/31/2018 [id number]       ASSESSMENT AND PLAN  Chronic migraine  Neck pain  Analgesic rebound headache    1.  Botox 200 u:  Frontalis (5 units 4), temporalis (5 units 8), occipitalis (5 units 6), corrugators and procerus (5 units 3), splenius capitis (15  units left and 20 U right), trapezius (10 units 2), C3C4 paraspinal muscles (10 U x 2); C6-C7 paraspinal muscles (10 units 2)     Office supply   200Ux1 vial. Lot: [id number]: [id number], expiration: 04/2021, NDC: 2956-2130-86: 0023-3921-02.  2.   If migraine occurs, can take Maxalt.    3.   He would like to switch over to Aimovig and we discussed the risks and benefits.   4.    Return for Botox in 3 months but can change to 6 months if the Aimovig is effective.  Call sooner if there are new or worsening neurologic symptoms.        A. Epimenio FootSater, MD, PhD 11/09/2018, 2:32 PM Certified in Neurology, Clinical Neurophysiology, Sleep Medicine, Pain Medicine and Neuroimaging  [facility] [address], [address] [place], [place] [address] [phone]

## 2018-11-10 ENCOUNTER — Telehealth: Payer: Self-pay | Admitting: *Deleted

## 2018-11-10 NOTE — Telephone Encounter (Signed)
Submitted PA Ajovy on CMM, Key: [id number]. Waiting on determination.

## 2018-11-13 NOTE — Telephone Encounter (Addendum)
[facility] fax [facility] from [facility] that PA denied. Medication will not be approved when it is taken with Botox or if pt has received botox within the last 3 months.   We will have to wait to resubmit PA after 02/09/19.   I called pt to inform him of above information. LVM (ok per DPR).

## 2018-11-15 ENCOUNTER — Emergency Department (HOSPITAL_COMMUNITY): Payer: BLUE CROSS/BLUE SHIELD

## 2018-11-15 ENCOUNTER — Encounter (HOSPITAL_COMMUNITY): Payer: Self-pay | Admitting: Emergency Medicine

## 2018-11-15 ENCOUNTER — Emergency Department (HOSPITAL_COMMUNITY)
Admission: EM | Admit: 2018-11-15 | Discharge: 2018-11-15 | Disposition: A | Discharge status: Home / Self Care | Payer: BLUE CROSS/BLUE SHIELD | Attending: Emergency Medicine | Admitting: Emergency Medicine

## 2018-11-15 ENCOUNTER — Other Ambulatory Visit: Payer: Self-pay

## 2018-11-15 DIAGNOSIS — Z79899 Other long term (current) drug therapy: Secondary | ICD-10-CM | POA: Insufficient documentation

## 2018-11-15 DIAGNOSIS — R51 Headache: Secondary | ICD-10-CM | POA: Diagnosis not present

## 2018-11-15 DIAGNOSIS — Z87891 Personal history of nicotine dependence: Secondary | ICD-10-CM | POA: Insufficient documentation

## 2018-11-15 DIAGNOSIS — S199XXA Unspecified injury of neck, initial encounter: Secondary | ICD-10-CM | POA: Diagnosis not present

## 2018-11-15 DIAGNOSIS — Y939 Activity, unspecified: Secondary | ICD-10-CM | POA: Insufficient documentation

## 2018-11-15 DIAGNOSIS — M542 Cervicalgia: Secondary | ICD-10-CM | POA: Diagnosis not present

## 2018-11-15 DIAGNOSIS — Y999 Unspecified external cause status: Secondary | ICD-10-CM | POA: Diagnosis not present

## 2018-11-15 DIAGNOSIS — W228XXA Striking against or struck by other objects, initial encounter: Secondary | ICD-10-CM | POA: Insufficient documentation

## 2018-11-15 DIAGNOSIS — G44309 Post-traumatic headache, unspecified, not intractable: Secondary | ICD-10-CM | POA: Insufficient documentation

## 2018-11-15 DIAGNOSIS — S0990XA Unspecified injury of head, initial encounter: Secondary | ICD-10-CM | POA: Diagnosis not present

## 2018-11-15 DIAGNOSIS — Y929 Unspecified place or not applicable: Secondary | ICD-10-CM | POA: Insufficient documentation

## 2018-11-15 MED ORDER — DIPHENHYDRAMINE HCL 50 MG/ML IJ SOLN
12.5000 mg | Freq: Once | INTRAMUSCULAR | Status: AC
  Administered 2018-11-15: 12.5 mg via INTRAVENOUS
  Filled 2018-11-15: qty 1

## 2018-11-15 MED ORDER — METOCLOPRAMIDE HCL 5 MG/ML IJ SOLN
10.0000 mg | Freq: Once | INTRAMUSCULAR | Status: AC
  Administered 2018-11-15: 10 mg via INTRAVENOUS
  Filled 2018-11-15: qty 2

## 2018-11-15 MED ORDER — ONDANSETRON 8 MG PO TBDP
8.0000 mg | ORAL_TABLET | Freq: Once | ORAL | Status: AC
  Administered 2018-11-15: 8 mg via ORAL
  Filled 2018-11-15: qty 1

## 2018-11-15 MED ORDER — HYDROCODONE-ACETAMINOPHEN 5-325 MG PO TABS: ORAL_TABLET | ORAL | 0 refills | Status: DC

## 2018-11-15 MED ORDER — IBUPROFEN 800 MG PO TABS: 800.0000 mg | ORAL_TABLET | Freq: Three times a day (TID) | ORAL | 0 refills | Status: DC

## 2018-11-15 MED ORDER — MORPHINE SULFATE (PF) 4 MG/ML IV SOLN
4.0000 mg | Freq: Once | INTRAVENOUS | Status: AC
  Administered 2018-11-15: 4 mg via INTRAVENOUS
  Filled 2018-11-15: qty 1

## 2018-11-15 MED ORDER — KETOROLAC TROMETHAMINE 30 MG/ML IJ SOLN
30.0000 mg | Freq: Once | INTRAMUSCULAR | Status: AC
  Administered 2018-11-15: 30 mg via INTRAVENOUS
  Filled 2018-11-15: qty 1
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
## 2018-11-15 NOTE — Discharge Instructions (Addendum)
Avoid bending, lifting, and straining for one week.  Follow-up with your PCP for recheck.  Return here for any worsening symptoms such as continued vomiting, visual changes, and sudden increased headache
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
## 2018-11-15 NOTE — ED Notes (Signed)
ED Provider at bedside. 

## 2018-11-15 NOTE — Telephone Encounter (Signed)
I called pt back. He did not receive my VM. His phone broke and he will be getting new one today. I relayed message below again. He verbalized understanding and appreciation.

## 2018-11-15 NOTE — Telephone Encounter (Signed)
Pt requesting a call stating he requested refill for ajovy from the pharmacy, stating he thought the PA had been approved. Requesting a call back to discuss.

## 2018-11-29 NOTE — Telephone Encounter (Signed)
I called to schedule the patient but he did not answer and his mailbox was full.

## 2018-12-11 ENCOUNTER — Telehealth: Payer: Self-pay | Admitting: Neurology

## 2018-12-11 NOTE — Telephone Encounter (Signed)
[email or web address]  called to schedule delivery for pt. She is asking for a call back

## 2018-12-18 NOTE — Telephone Encounter (Signed)
Patient does not currently have an apt. Will call to schedule once he does.

## 2019-01-11 DIAGNOSIS — R05 Cough: Secondary | ICD-10-CM | POA: Diagnosis not present

## 2019-01-18 ENCOUNTER — Other Ambulatory Visit: Payer: Self-pay | Admitting: Neurology

## 2019-01-25 ENCOUNTER — Telehealth: Payer: Self-pay | Admitting: Neurology

## 2019-01-25 NOTE — Telephone Encounter (Signed)
I called pt back and LVM reminding him that we have to wait until after 02/09/19 to submit PA to insurance. They will not cover medication until after 44months past receiving last botox. Asked him to call back if he has further questions.

## 2019-01-25 NOTE — Telephone Encounter (Signed)
Pt states he is needing a PA for his Erenumab-aooe (AIMOVIG) 140 MG/ML SOAJ Pharmacy denied due to not having an [place]. Please advise.

## 2019-02-08 ENCOUNTER — Telehealth: Payer: Self-pay | Admitting: Neurology

## 2019-02-08 NOTE — Telephone Encounter (Signed)
[facility] needs PA for botox [phone] Thomas Howell

## 2019-02-12 MED ORDER — ERENUMAB-AOOE 140 MG/ML ~~LOC~~ SOAJ: 140.0000 mg | SUBCUTANEOUS | 5 refills | Status: DC

## 2019-02-12 NOTE — Telephone Encounter (Signed)
Pt stopped botox and is going on Aimovig inj. He had to wait 3 months post last botox inj to have PA approved to be able to start Aimovig. This was approved today. Pt aware and is going to try Aimovig for migraines.

## 2019-02-12 NOTE — Addendum Note (Signed)
Addended by: Hillis Range on: 02/12/2019 04:13 PM   Modules accepted: Orders

## 2019-02-12 NOTE — Telephone Encounter (Signed)
PA for Aimovig submitted and approved on covermymeds. Key: [id number]. Effective  02/12/2019 through 05/12/2019. Called pt and informed him. He verbalized understanding and appreciation. He is going to stop botox and try aimovig since botox was not very effective for migraines.

## 2019-02-13 NOTE — Telephone Encounter (Signed)
Thomas [email or web address] has called for a PA on Botox for pt, for the PA please call [phone]

## 2019-02-13 NOTE — Telephone Encounter (Signed)
Yes patient not currently scheduled for Botox. DW

## 2019-02-14 NOTE — Telephone Encounter (Signed)
Please refer to previous notes. Patient is not getting Botox any longer.

## 2019-02-20 ENCOUNTER — Other Ambulatory Visit: Payer: Self-pay | Admitting: Neurology

## 2019-02-20 IMAGING — CT CT RENAL STONE PROTOCOL
2 of 4 series · 16 of 46 positions shown, 18 images · non-contrast
Comparison: CT urogram 03/12/2018.

CLINICAL DATA: LEFT flank pain.  Recent passage of a kidney stone.

EXAM:
CT ABDOMEN AND PELVIS WITHOUT CONTRAST
TECHNIQUE: Multidetector CT imaging of the abdomen and pelvis was performed
following the standard protocol without IV contrast.

[Series 2: axial st · axial · 0.87mm/px · z∈[-278,+147]mm · 13 of 95 slices shown, 15 images]
[im 5/95  soft-tissue]
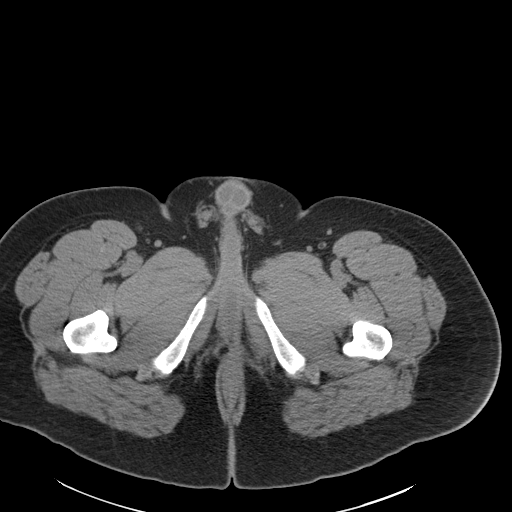
[im 5/95  bone]
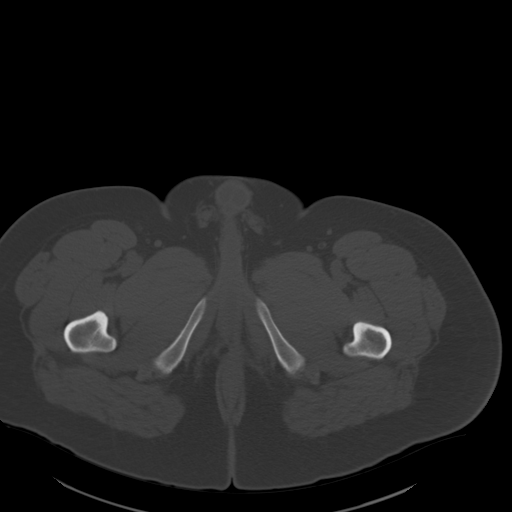
[im 15/95  soft-tissue]
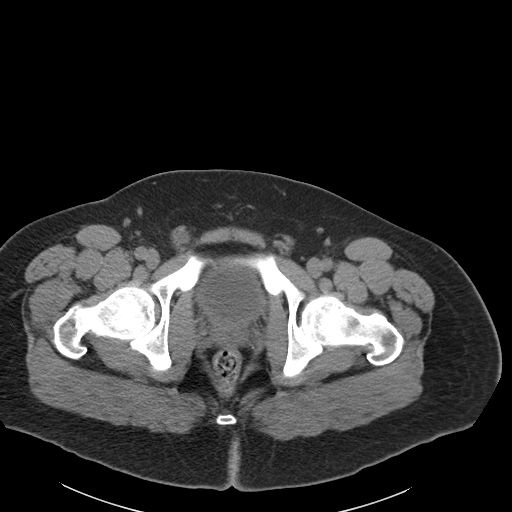
[im 19/95  soft-tissue]
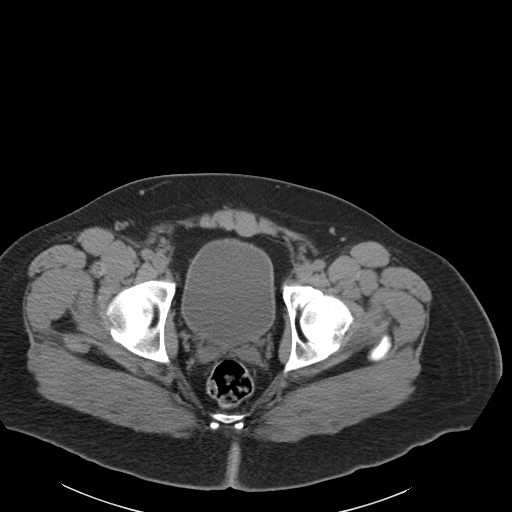
[im 29/95  soft-tissue]
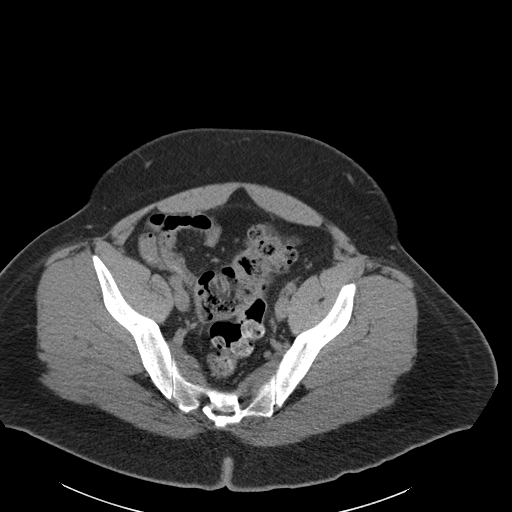
[im 33/95  soft-tissue]
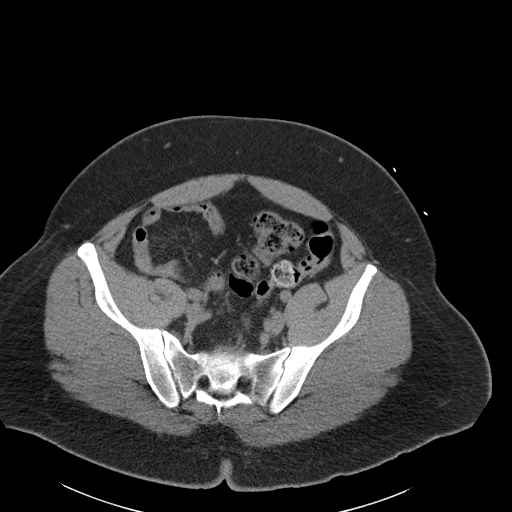
[im 43/95  soft-tissue]
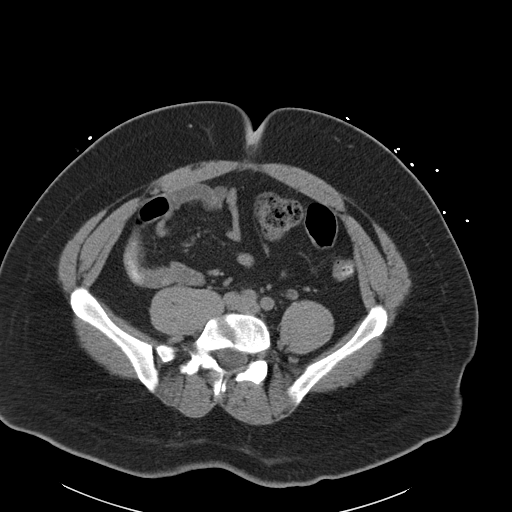
[im 48/95  soft-tissue]
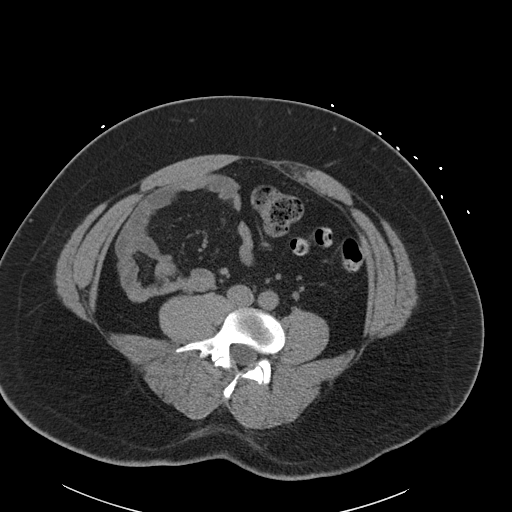
[im 52/95  soft-tissue]
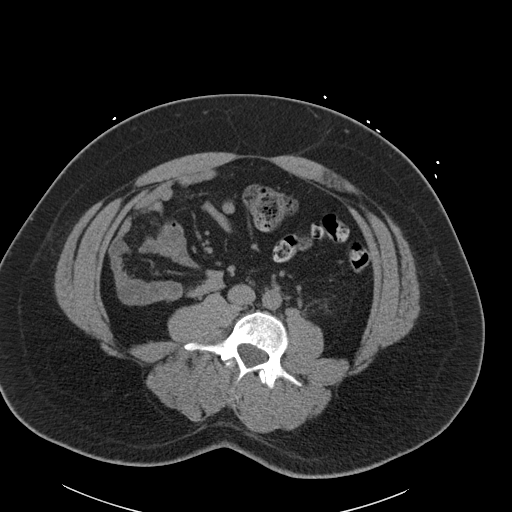
[im 62/95  soft-tissue]
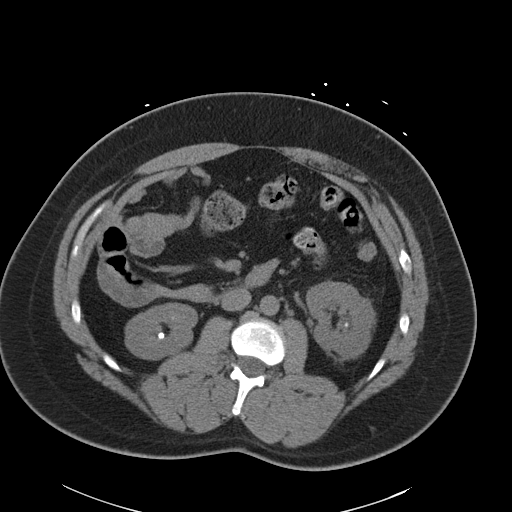
[im 62/95  bone]
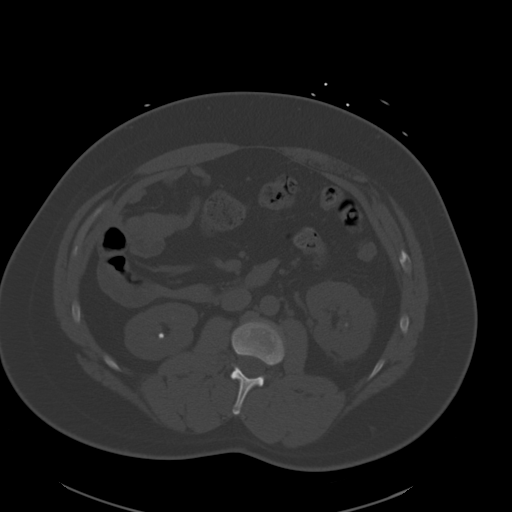
[im 66/95  soft-tissue]
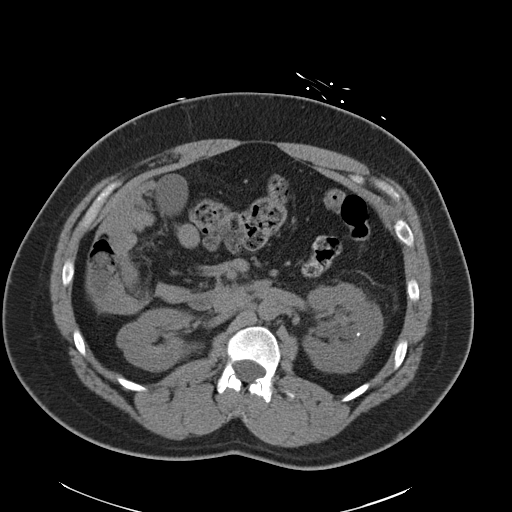
[im 76/95  soft-tissue]
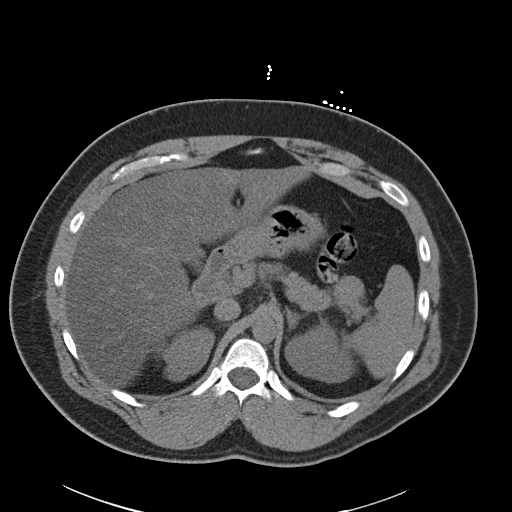
[im 80/95  soft-tissue]
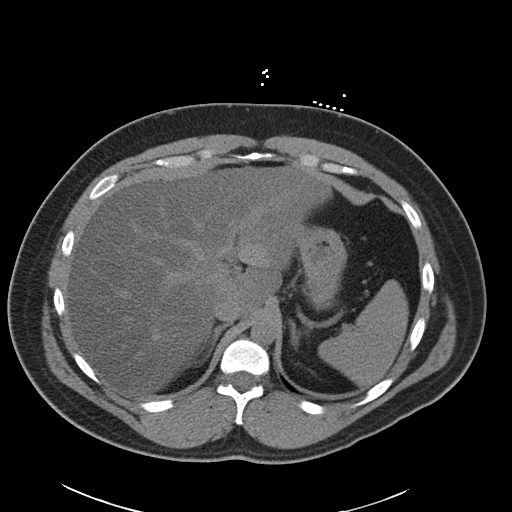
[im 90/95  soft-tissue]
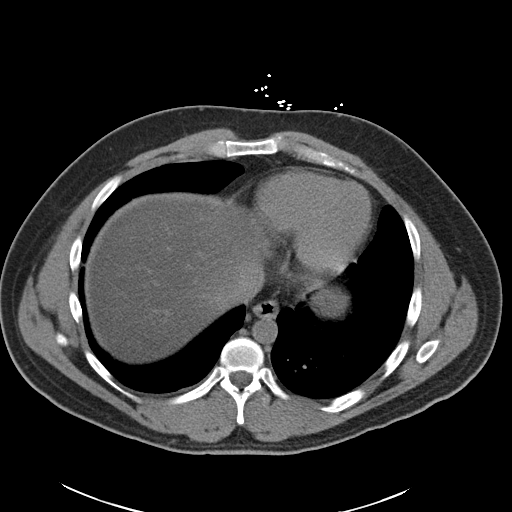

[Series 5: coronal · coronal · 0.84mm/px · 3 of 149 slices shown]
[im 50/149  soft-tissue]
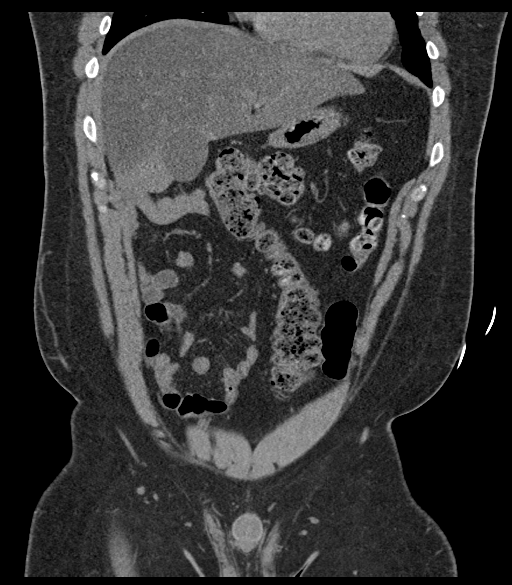
[im 66/149  soft-tissue]
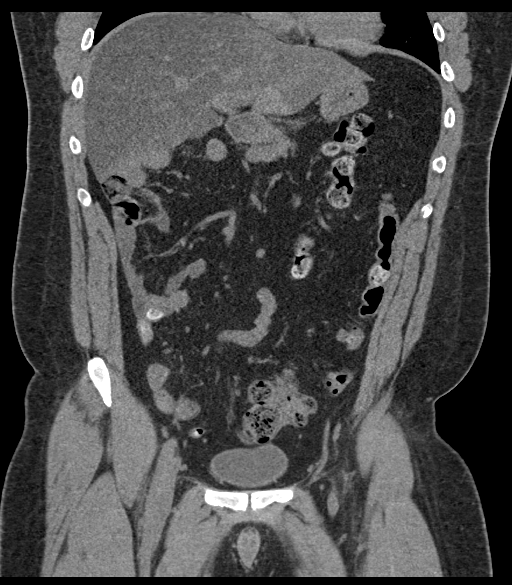
[im 83/149  soft-tissue]
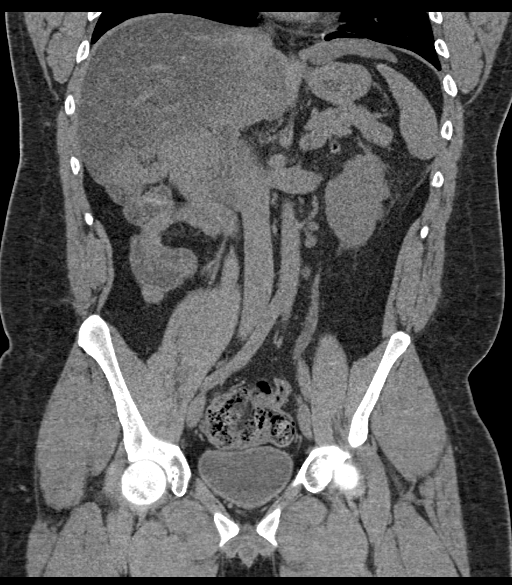

[16 of 46 positions shown; findings below may reference images not displayed]

FINDINGS: Lower chest: No acute abnormality.

Hepatobiliary: Low attenuation of the liver consistent with
steatosis. No focal abnormality. No gallstones, gallbladder wall
thickening, or biliary ductal dilatation.

Pancreas: Unremarkable. No pancreatic ductal dilatation or
surrounding inflammatory changes.

Spleen: Normal in size without focal abnormality.

Adrenals/Urinary Tract: Normal adrenal glands. BILATERAL
nephrolithiasis; the largest stones are dependent in the LEFT
collecting system, up to 5 mm in length. LEFT hydronephrosis and
hydroureter without obstructing calculus. Normal bladder.

Stomach/Bowel: Stomach is within normal limits. Appendix appears
normal. No evidence of bowel wall thickening, distention, or
inflammatory changes.

Vascular/Lymphatic: No significant vascular findings are present. No
enlarged abdominal or pelvic lymph nodes.

Reproductive: Prostate is unremarkable.

Other: Tiny umbilical hernia.  No abdominopelvic ascites.

Musculoskeletal: Transitional lumbosacral anatomy. Degenerative
scoliosis convex LEFT mid lumbar region.
IMPRESSION: BILATERAL nephrolithiasis. LEFT hydronephrosis and hydroureter
without obstructing calculus, likely reflecting recent passage of a
stone.

Hepatic steatosis.

## 2019-02-23 ENCOUNTER — Other Ambulatory Visit: Payer: Self-pay | Admitting: Neurology

## 2019-02-27 DIAGNOSIS — N202 Calculus of kidney with calculus of ureter: Secondary | ICD-10-CM | POA: Diagnosis not present

## 2019-02-27 DIAGNOSIS — N200 Calculus of kidney: Secondary | ICD-10-CM | POA: Diagnosis not present

## 2019-03-25 ENCOUNTER — Other Ambulatory Visit: Payer: Self-pay | Admitting: Neurology

## 2019-04-02 DIAGNOSIS — R82991 Hypocitraturia: Secondary | ICD-10-CM | POA: Diagnosis not present

## 2019-04-02 DIAGNOSIS — N202 Calculus of kidney with calculus of ureter: Secondary | ICD-10-CM | POA: Diagnosis not present

## 2019-04-02 DIAGNOSIS — R82994 Hypercalciuria: Secondary | ICD-10-CM | POA: Diagnosis not present

## 2019-04-21 IMAGING — DX DG ABDOMEN 1V
2 series · 2 of 2 positions shown · non-contrast
Comparison: 03/22/2018.  CT 08/14/2018

CLINICAL DATA: Right flank pain, vomiting

EXAM:
ABDOMEN - 1 VIEW

[abdomen kub (1 of 2)]
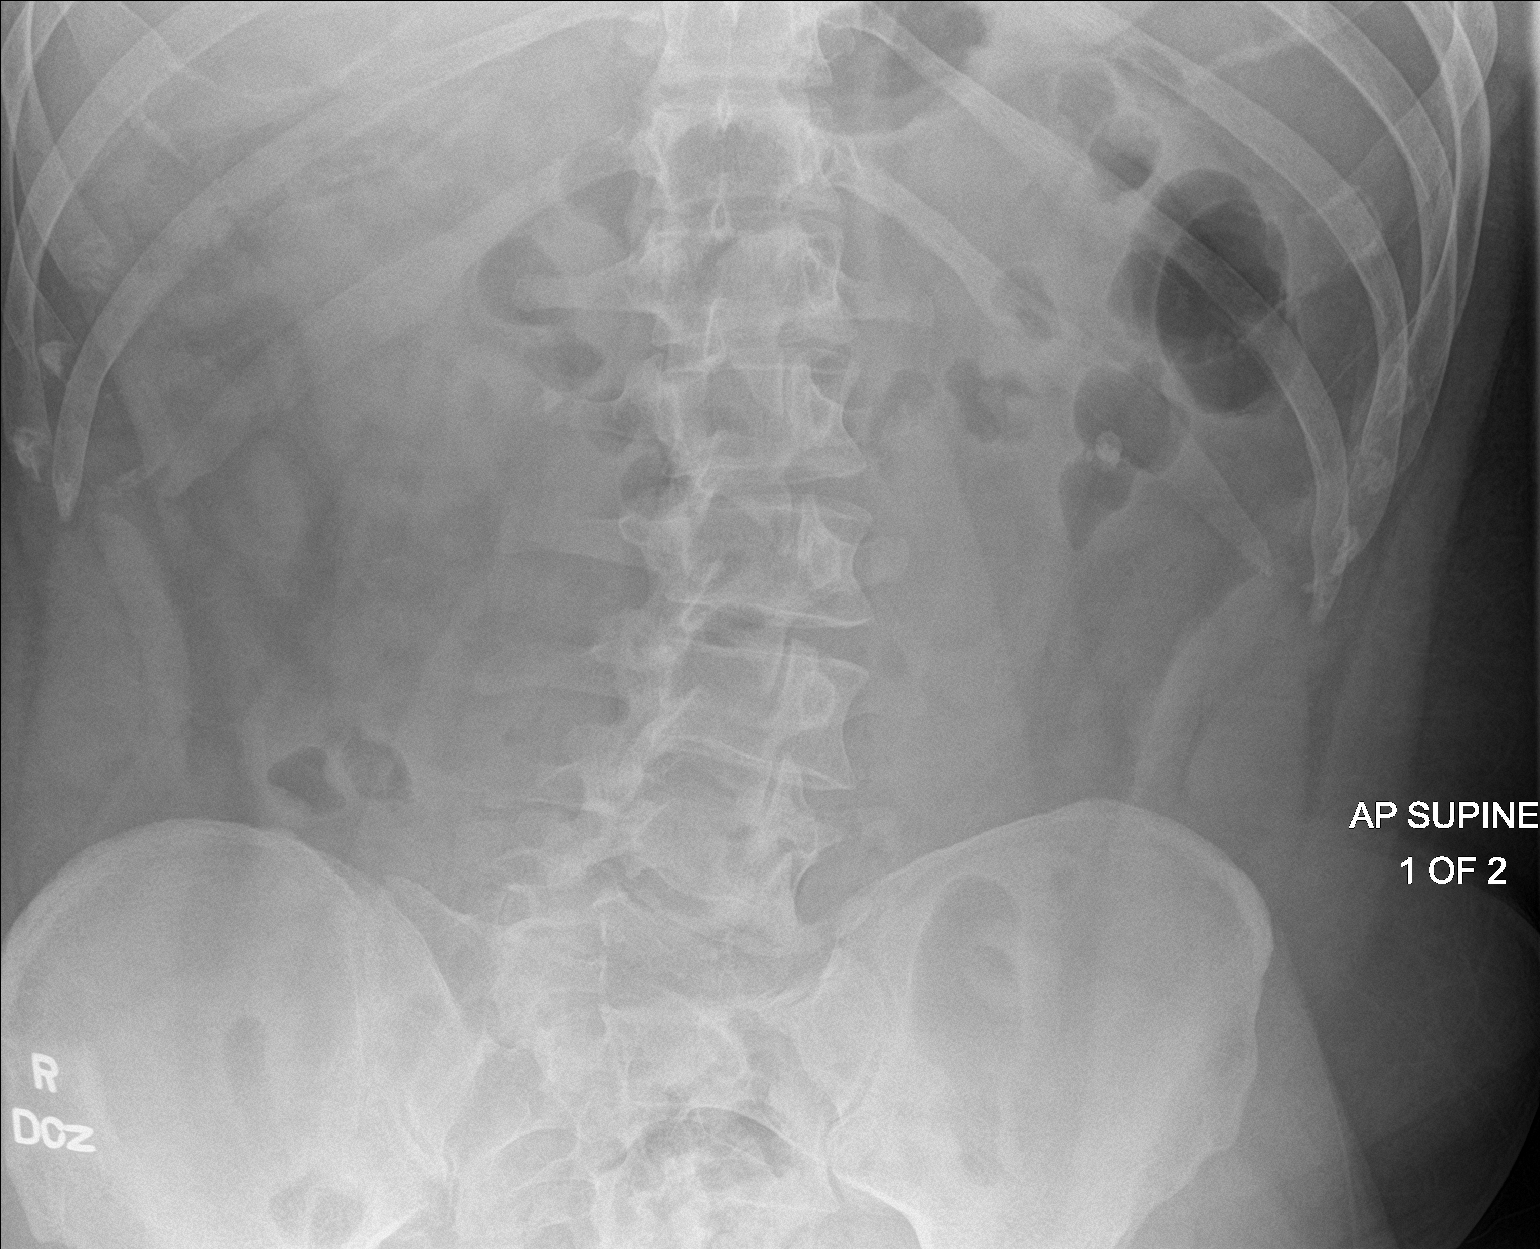

[abdomen kub (2 of 2)]
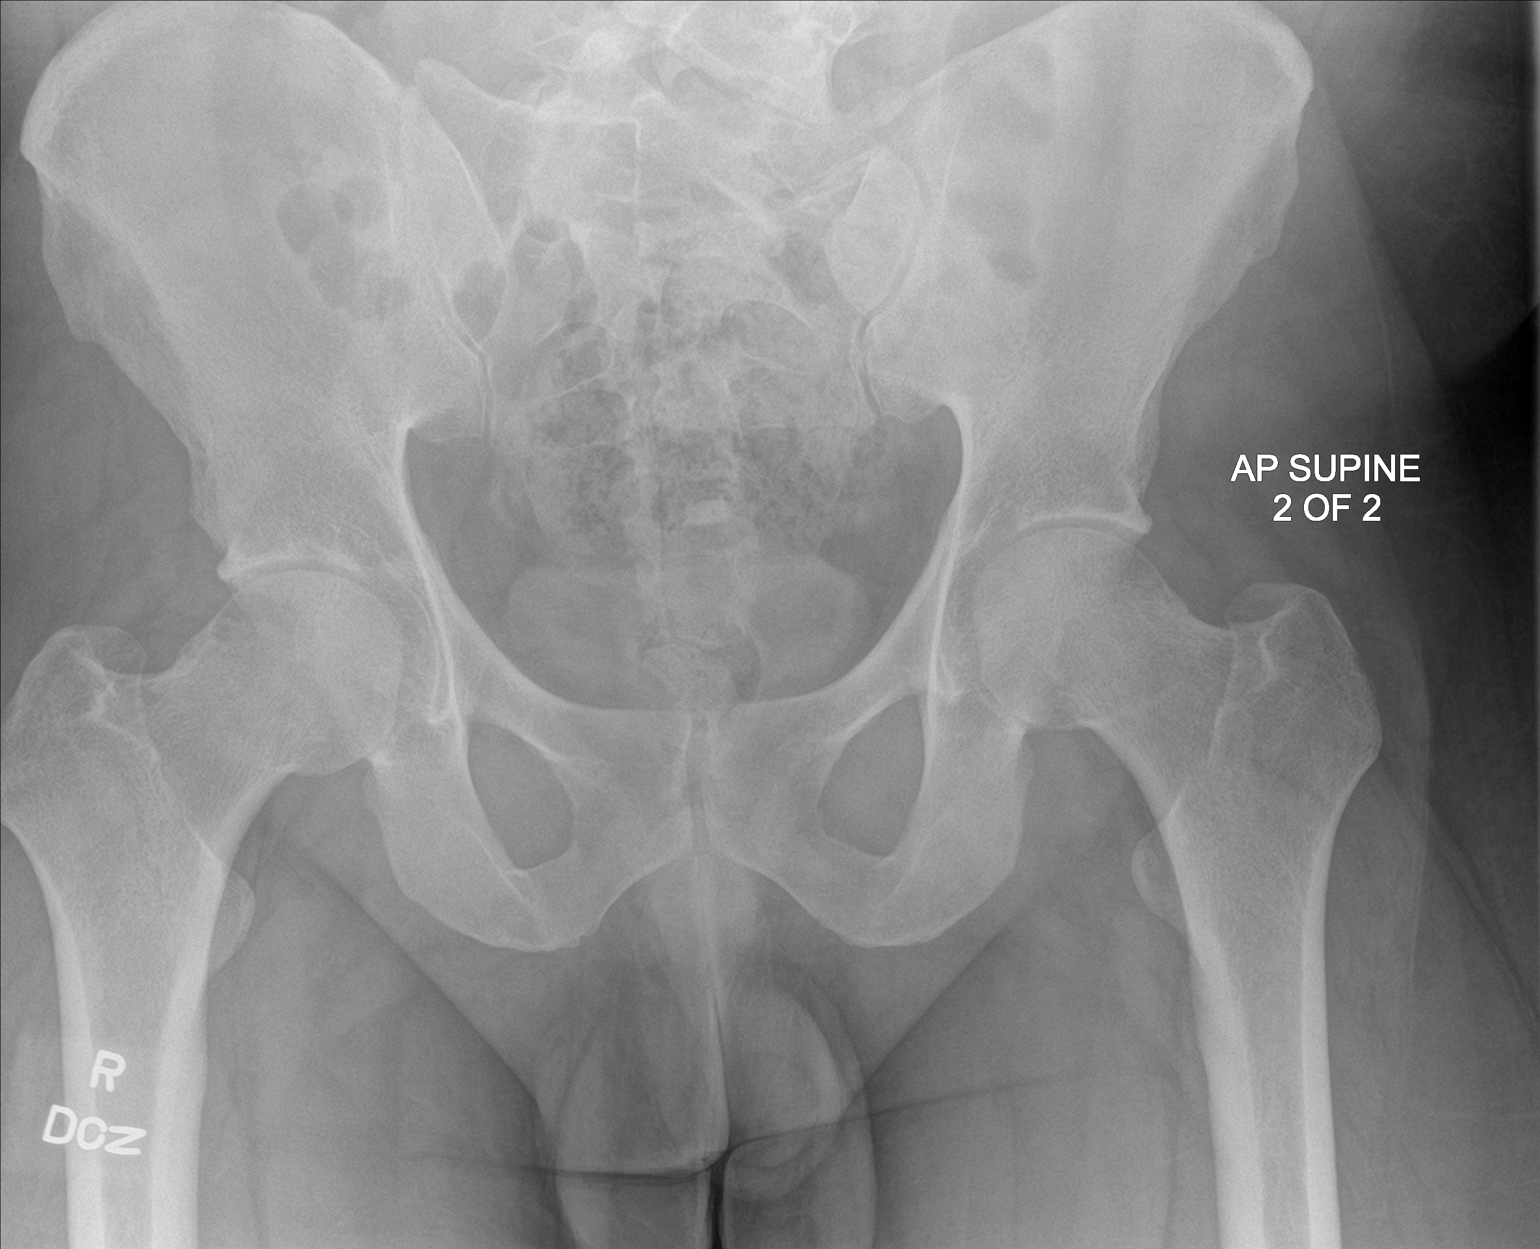

[2 of 2 positions shown; findings below may reference images not displayed]

FINDINGS: 6 mm calcification projects medial to the lower pole of the right
kidney, possibly within the proximal right ureter. 9 mm left lower
pole renal stone noted. Nonobstructive bowel gas pattern. No
organomegaly or free air.
IMPRESSION: Left lower pole nephrolithiasis.

6 mm calcification projects medial to the right lower kidney and may
be within the proximal right ureter.

## 2019-04-22 ENCOUNTER — Other Ambulatory Visit: Payer: Self-pay | Admitting: Neurology

## 2019-04-26 DIAGNOSIS — N200 Calculus of kidney: Secondary | ICD-10-CM | POA: Diagnosis not present

## 2019-04-26 DIAGNOSIS — R1084 Generalized abdominal pain: Secondary | ICD-10-CM | POA: Diagnosis not present

## 2019-04-30 ENCOUNTER — Other Ambulatory Visit: Payer: Self-pay | Admitting: *Deleted

## 2019-04-30 MED ORDER — LAMOTRIGINE 100 MG PO TABS: 100.0000 mg | ORAL_TABLET | Freq: Two times a day (BID) | ORAL | 0 refills | Status: DC

## 2019-05-03 ENCOUNTER — Telehealth: Payer: Self-pay | Admitting: *Deleted

## 2019-05-03 NOTE — Telephone Encounter (Signed)
Submitted PA aimovig on CMM. Key: [id number]. Waiting on determination.  "Your information has been submitted to [facility]. [employer] [place] will review the request and fax you a determination directly, typically within 3 business days of your submission once all necessary information is received.  If [employer] [place] has not responded in 3 business days or if you have any questions about your submission, contact [employer] [place] at [phone]."

## 2019-05-07 NOTE — Telephone Encounter (Signed)
Received fax notification from [facility] that PA approved 05/03/19-05/01/20. Reference number: [id number].

## 2019-05-09 IMAGING — US RENAL/URINARY TRACT ULTRASOUND
1 series · 14 of 25 positions shown · non-contrast
Comparison: KUB October 22, 2018

CLINICAL DATA: Status post ureteral stent removal now with severe
left flank pain.

EXAM:
RENAL / URINARY TRACT ULTRASOUND COMPLETE

[Series 1: renal/urinary tract ultrasound · 14 of 124 slices shown]
[im 1/124]
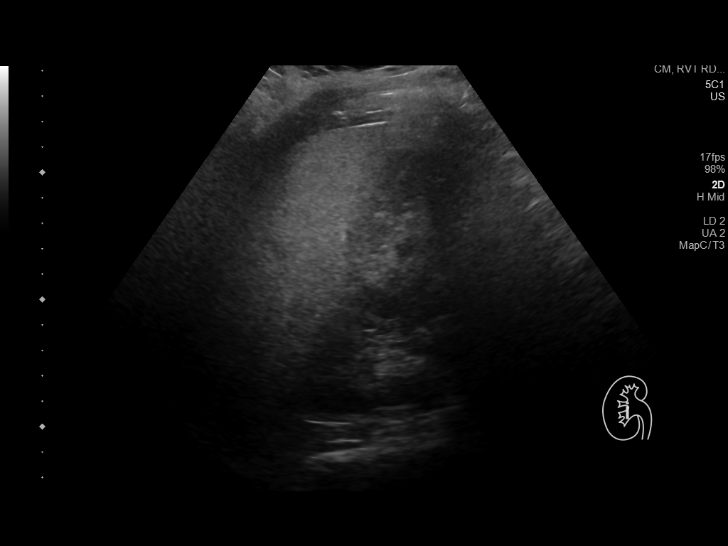
[im 11/124]
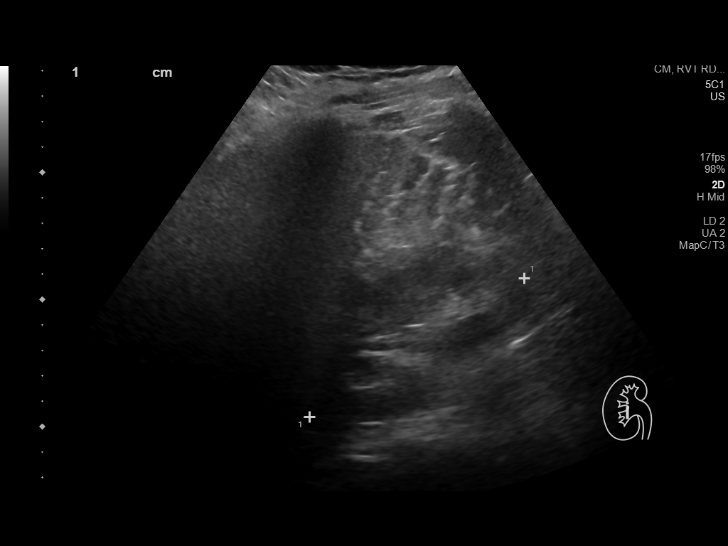
[im 21/124]
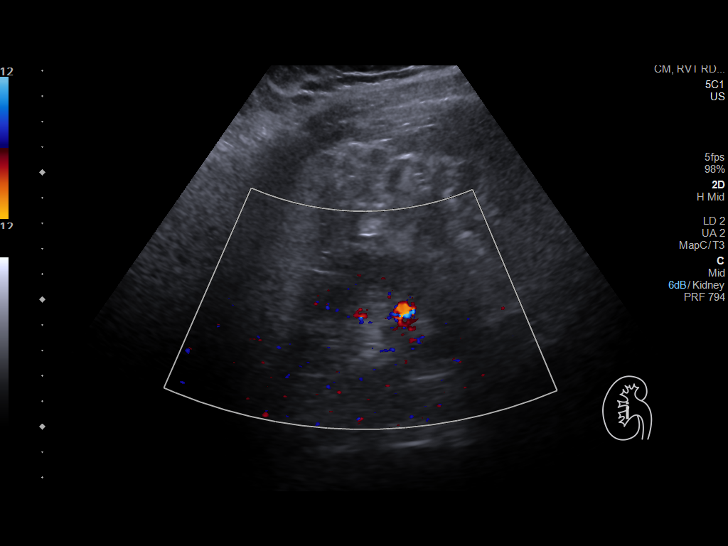
[im 31/124]
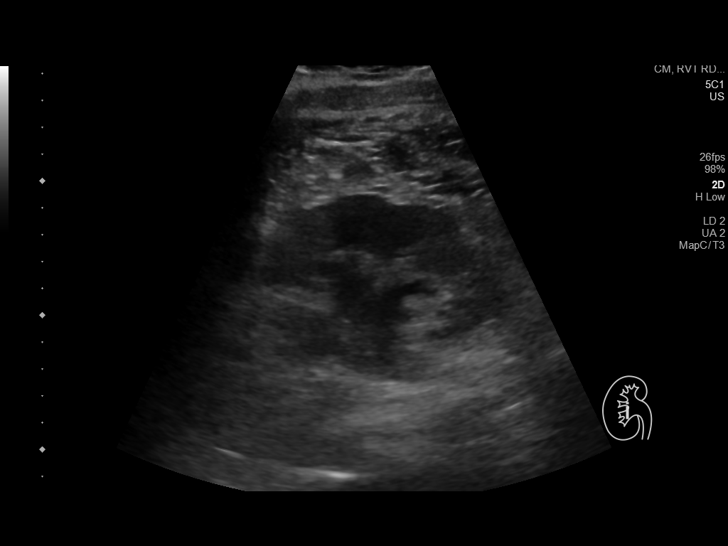
[im 42/124]
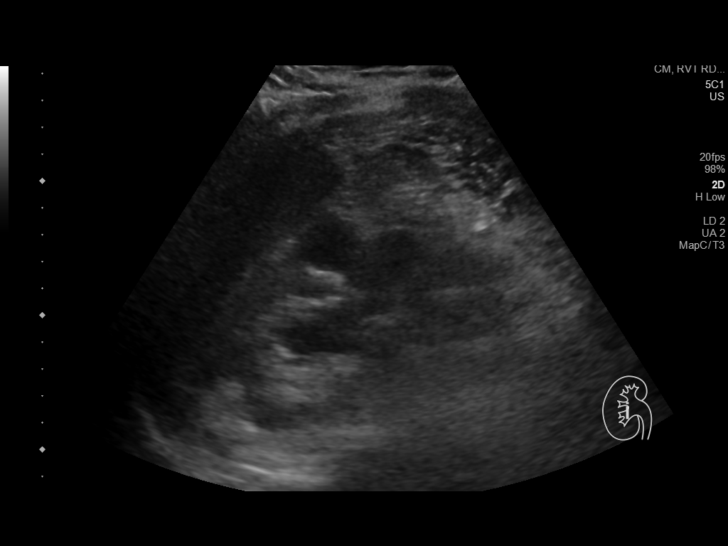
[im 47/124]
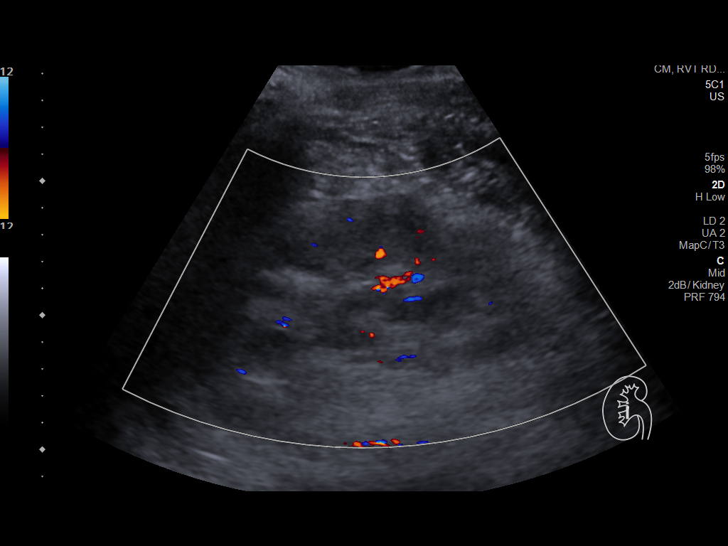
[im 57/124]
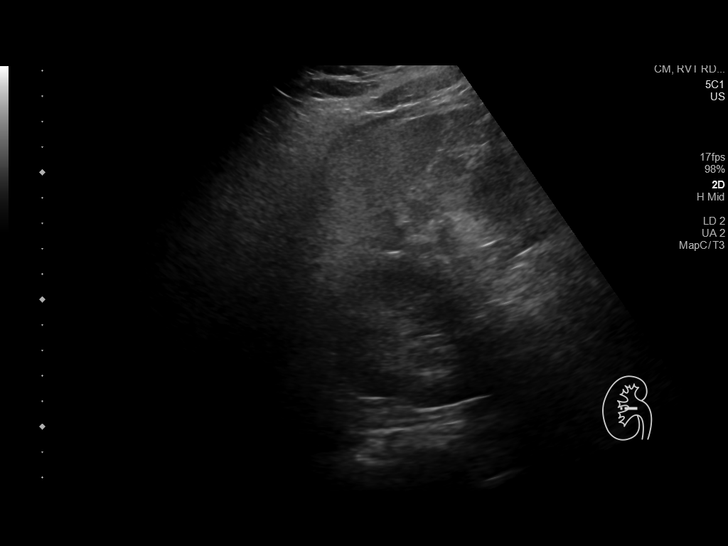
[im 67/124]
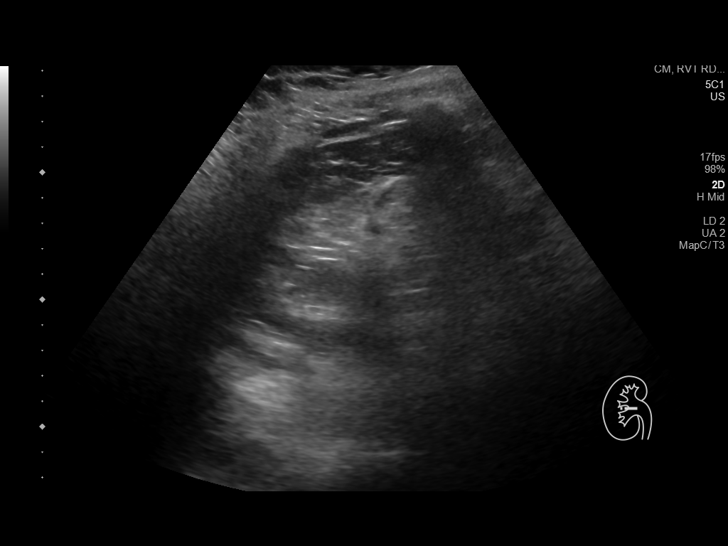
[im 77/124]
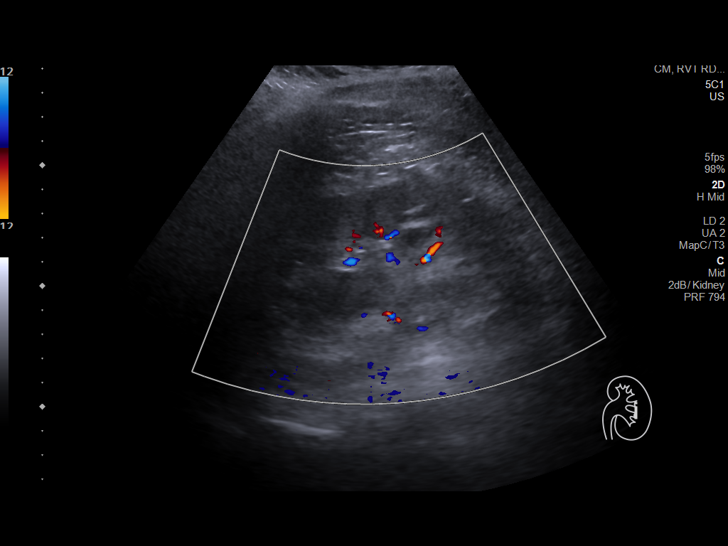
[im 83/124]
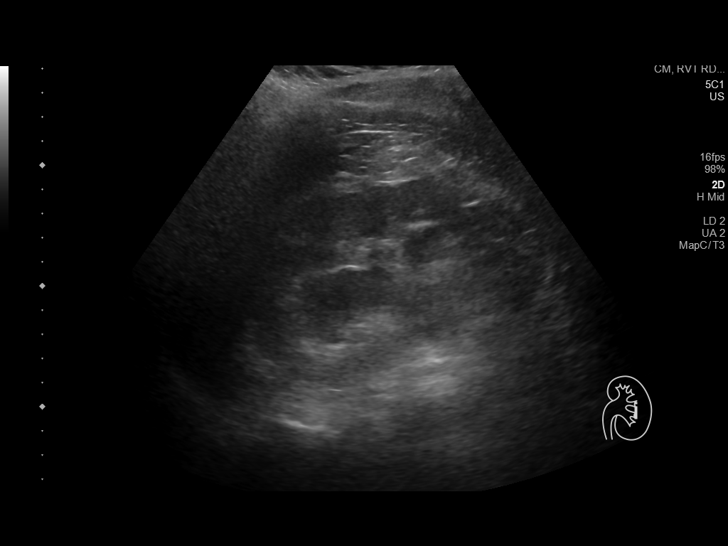
[im 93/124]
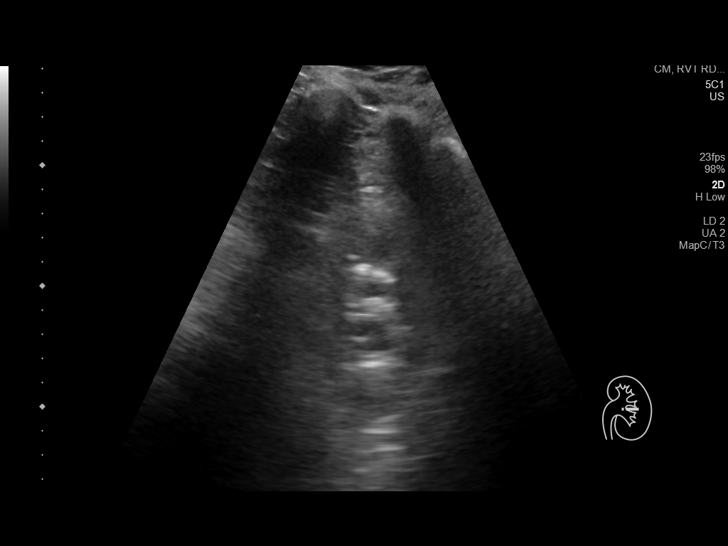
[im 103/124]
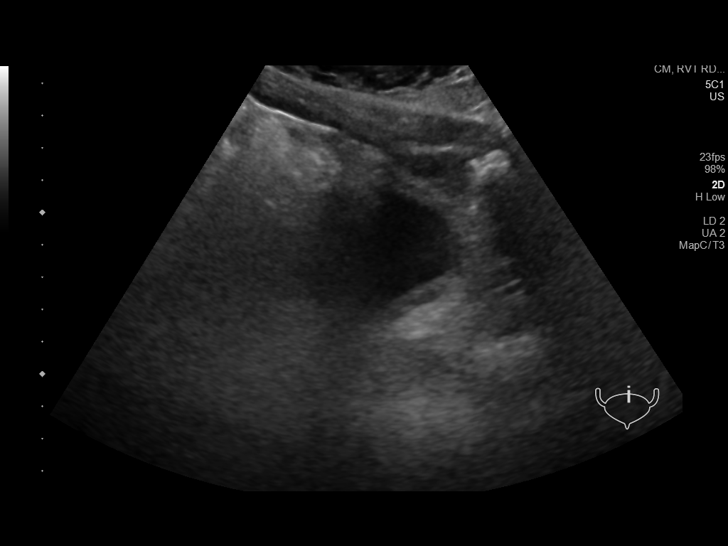
[im 113/124]
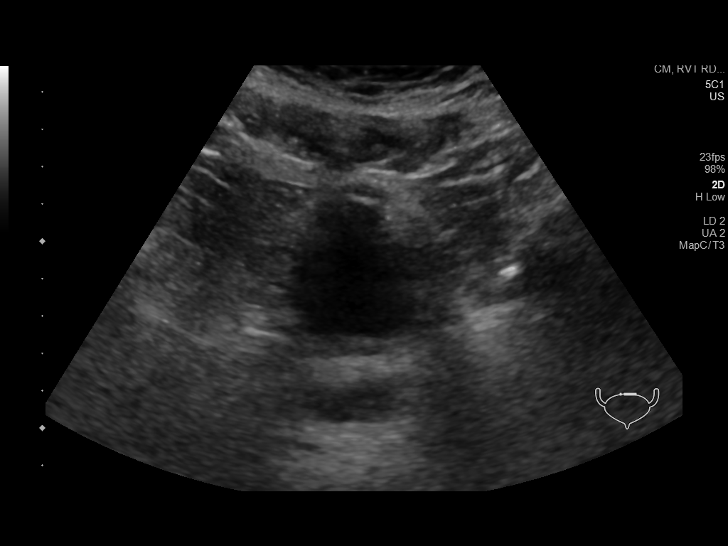
[im 124/124]
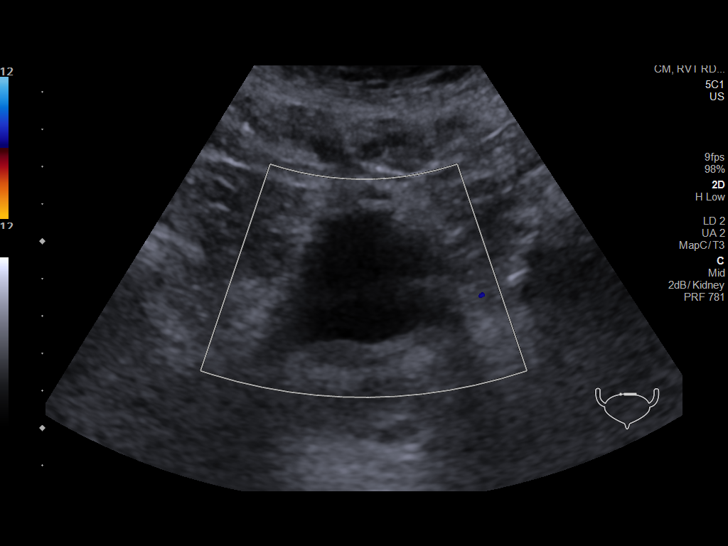

[14 of 25 positions shown; findings below may reference images not displayed]

FINDINGS: Right Kidney:

Renal measurements: 10.1 x 5.9 x 5.7 cm = volume: 176 cc mL .
Echogenicity within normal limits. No mass or hydronephrosis
visualized.

Left Kidney:

Renal measurements: 12.6 x 6.5 x 5.3 cm = volume: 227 cc mL. There
is moderate left-sided hydronephrosis. The renal cortical
echotexture remains normal.

Bladder:

The partially distended urinary bladder is normal.
IMPRESSION: Moderate left-sided hydronephrosis with enlargement of the kidney.

Normal appearing right kidney and partially distended urinary
bladder.

## 2019-05-22 ENCOUNTER — Other Ambulatory Visit: Payer: Self-pay | Admitting: Neurology

## 2019-05-23 ENCOUNTER — Telehealth: Payer: Self-pay | Admitting: Neurology

## 2019-05-23 MED ORDER — RIZATRIPTAN BENZOATE 10 MG PO TBDP: ORAL_TABLET | ORAL | 0 refills | Status: DC

## 2019-05-23 NOTE — Telephone Encounter (Signed)
Called pt. Advised he last saw Dr. Epimenio Foot 10/2018 and needed f/u. He works until [employer] and requested appt after this. Dr. Epimenio Foot has no availability. He was agreeable to see Amy L, NP for virtual visit. This is a f/u for migraines and he is stable. No new sx.   Pt understands that although there may be some limitations with this type of visit, we will take all precautions to reduce any security or privacy concerns.  Pt understands that this will be treated like an in office visit and we will file with pt's insurance, and there may be a patient responsible charge related to this service. Emailed pt link at: [email or web address]. Asked him to call back if he does not receive.   He is out of rizatriptan. Advised I will send in refill until he can see Amy. He verbalized understanding and appreciation. I escribed rx.

## 2019-05-23 NOTE — Telephone Encounter (Signed)
Pt is needing a refill on his rizatriptan (MAXALT-MLT) 10 MG disintegrating tablet but his pharmacy is denying him the refill. Pt would like to know what to do. Please advise.
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
## 2019-05-29 ENCOUNTER — Other Ambulatory Visit: Payer: Self-pay

## 2019-05-29 ENCOUNTER — Ambulatory Visit (INDEPENDENT_AMBULATORY_CARE_PROVIDER_SITE_OTHER): Payer: 59 | Admitting: Family Medicine

## 2019-05-29 ENCOUNTER — Encounter: Payer: Self-pay | Admitting: Family Medicine

## 2019-05-29 ENCOUNTER — Telehealth: Payer: Self-pay | Admitting: *Deleted

## 2019-05-29 DIAGNOSIS — G43009 Migraine without aura, not intractable, without status migrainosus: Secondary | ICD-10-CM | POA: Diagnosis not present

## 2019-05-29 NOTE — Telephone Encounter (Signed)
Received fax request to complete new PA for Aimovig d/t pt insurance changing. I completed on CMM via optum. Key: [id number]. Pt ID: [id number]. Waiting on determination.

## 2019-05-29 NOTE — Telephone Encounter (Signed)
Received fax notification from [employer] that PA approved through 08/29/19. [id number].  Faxed notice of approval to [facility] at [phone]. Received fax notification.

## 2019-05-29 NOTE — Progress Notes (Signed)
PATIENT: Thomas Howell DOB: [birth year]  REASON FOR VISIT: follow up HISTORY FROM: patient Thomas Howell Virtual Visit via Telephone Note  I connected with Thomas MendsRussell K Howell on 05/29/19 at  3:30 PM EDT by telephone and verified that I am speaking with the correct person using two identifiers.   I discussed the limitations, risks, security and privacy concerns of performing an evaluation and management service by telephone and the availability of in person appointments. I also discussed with the patient that there may be a patient responsible charge related to this service. The patient expressed understanding and agreed to proceed.   History of Present Illness:  05/29/19 Thomas MendsRussell K Rehfeldt is a 28 y.o. male here today for follow up of migraines. He was started on Amovig in 11/2018. He continues levetiracetam 750 in the morning and 1500mg  in the evenings. He is also taking lamotrigine 100mg  BID. He uses Fioricet and Maxalt for abortive therapy.  He rarely uses Aleve or Excedrin.  He reports that the first couple of months of therapy with Aimovig were great.  He states that his migraines nearly disappeared.  Around March with the outbreak of COVID, he was required to wear a facemask every day to work.  He states that since this has occurred he has had a mild increase in his headaches.  He is now having 1-2 migraines per week.  This is still significantly less than his baseline of daily headaches and 18-20 migraines per month.  He does contribute to benefit to Aimovig.   HISTORY (copied from Dr Bonnita HollowSater's note on 02/14/2017)   Thomas CootsRussell Montanari  is a 28 yo man who has had frequent migraine headaches since age 89.  They have progressed and continue to occur frequently, despite multiple prophylactic agents.    Currently he is on Lamictal and Keppra and is experiencing about 18 headaches a month (18/30 a month) lasting > 4 hours each day.   He has some occipital headache pain every day (30/30 a month) but milder  pain sometimes responds to Excedrin Migraine  A typical headache starts in the occiput and then radiates to the eye (usually on the right) with a pounding quality.      He has no preceding aura.   He gets nausea and vomiting.   Moving increases the headache and laying in bed in a dark quiet room helps.    He has associated phonophobia and photophobia. Most headaches are random but some are triggered by strong smells such as perfume.  Maxalt helps some of the migraine headaches.  Often he has to take 2 over the day to get rid of the headache .   He uses about 10/month often in combination with Aleve.   Phenergan helps nausea    If he falls asleep, pain is oftebetter upon awakening.     HA treatment History:  On topiramate, the frequency dropped to 1/week but it caused him to have many more kidney stones. Amitriptyline caused grogginess the next day.   Nortriptyline had not helped.    Keppra and lamotriine did not help the headache frequency much.  Aleve and other NSAID's by themself have not helped.  He sleeps well most nights.   Observations/Objective:  Generalized: Well developed, in no acute distress  Mentation: Alert oriented to time, place, history taking. Follows all commands speech and language fluent   Assessment and Plan:  28 y.o. year old male  has a past medical history of Complication of anesthesia, Frequency-urgency syndrome, History  of kidney stones, nausea and vomiting, sepsis (08/11/2017), Left ureteral stone, Migraine, Pain due to ureteral stent ([place]), PONV (postoperative nausea and vomiting), Renal calculi, Scoliosis of lumbar spine (1994), and Wears glasses. here with    ICD-10-CM   1. Migraine without aura and without status migrainosus, not intractable G43.009    Thomas Howell does appear to be responding very well to Aimovig monthly continued with Keppra and Lamictal.  Maxalt and Fioricet worked well for abortive therapy.  We will continue therapy as prescribed.  I have  encouraged him to consider trying a different mask to see if it offers better ventilation.  I have advised against use of over-the-counter medications on a regular basis.  We will refill Aimovig.  Prior authorization sent today.  I recommend annual follow-up, sooner if needed.  He verbalizes understanding and agreement with this plan.   No orders of the defined types were placed in this encounter.   No orders of the defined types were placed in this encounter.    Follow Up Instructions:  I discussed the assessment and treatment plan with the patient. The patient was provided an opportunity to ask questions and all were answered. The patient agreed with the plan and demonstrated an understanding of the instructions.   The patient was advised to call back or seek an in-person evaluation if the symptoms worsen or if the condition fails to improve as anticipated.  I provided 20 minutes of non-face-to-face time during this encounter.  Patient is located at his place of residence during video conference.  Provider is located in the office.  Maryelizabeth Kaufmann, CMA helped to facilitate visit.   Debbora Presto, NP

## 2019-05-29 NOTE — Progress Notes (Signed)
I have read the note, and I agree with the clinical assessment and plan.  Shyam Dawson A. Tila Millirons, MD, PhD, FAAN Certified in Neurology, Clinical Neurophysiology, Sleep Medicine, Pain Medicine and Neuroimaging  [facility] [address], [address] [place],  [address] [phone]  

## 2019-07-10 ENCOUNTER — Other Ambulatory Visit: Payer: Self-pay | Admitting: Neurology

## 2019-07-26 ENCOUNTER — Other Ambulatory Visit: Payer: Self-pay | Admitting: Neurology

## 2019-08-17 ENCOUNTER — Other Ambulatory Visit: Payer: Self-pay | Admitting: Neurology

## 2019-08-21 ENCOUNTER — Other Ambulatory Visit: Payer: Self-pay

## 2019-08-21 ENCOUNTER — Emergency Department (HOSPITAL_COMMUNITY): Payer: 59

## 2019-08-21 ENCOUNTER — Emergency Department (HOSPITAL_COMMUNITY)
Admission: EM | Admit: 2019-08-21 | Discharge: 2019-08-21 | Disposition: A | Discharge status: Home / Self Care | Payer: 59 | Attending: Emergency Medicine | Admitting: Emergency Medicine

## 2019-08-21 ENCOUNTER — Telehealth: Payer: 59 | Admitting: Physician Assistant

## 2019-08-21 ENCOUNTER — Encounter (HOSPITAL_COMMUNITY): Payer: Self-pay | Admitting: Emergency Medicine

## 2019-08-21 DIAGNOSIS — R110 Nausea: Secondary | ICD-10-CM | POA: Insufficient documentation

## 2019-08-21 DIAGNOSIS — R0602 Shortness of breath: Secondary | ICD-10-CM | POA: Diagnosis not present

## 2019-08-21 DIAGNOSIS — Z79899 Other long term (current) drug therapy: Secondary | ICD-10-CM | POA: Insufficient documentation

## 2019-08-21 DIAGNOSIS — R0981 Nasal congestion: Secondary | ICD-10-CM | POA: Diagnosis not present

## 2019-08-21 DIAGNOSIS — R42 Dizziness and giddiness: Secondary | ICD-10-CM | POA: Diagnosis not present

## 2019-08-21 DIAGNOSIS — Z87891 Personal history of nicotine dependence: Secondary | ICD-10-CM | POA: Insufficient documentation

## 2019-08-21 DIAGNOSIS — R0789 Other chest pain: Secondary | ICD-10-CM | POA: Insufficient documentation

## 2019-08-21 DIAGNOSIS — M791 Myalgia, unspecified site: Secondary | ICD-10-CM | POA: Diagnosis not present

## 2019-08-21 DIAGNOSIS — Z20828 Contact with and (suspected) exposure to other viral communicable diseases: Secondary | ICD-10-CM | POA: Insufficient documentation

## 2019-08-21 LAB — TROPONIN I (HIGH SENSITIVITY): Troponin I (High Sensitivity): <2 ng/L (ref <18)

## 2019-08-21 LAB — CBC
HCT: 46.7 % (ref 39.0–52.0)
Hemoglobin: 15.6 g/dL (ref 13.0–17.0)
MCH: 30.2 pg (ref 26.0–34.0)
MCHC: 33.4 g/dL (ref 30.0–36.0)
MCV: 90.3 fL (ref 80.0–100.0)
Platelets: 192 10*3/uL (ref 150–400)
RBC: 5.17 MIL/uL (ref 4.22–5.81)
RDW: 12 % (ref 11.5–15.5)
WBC: 6.8 10*3/uL (ref 4.0–10.5)
nRBC: 0 % (ref 0.0–0.2)

## 2019-08-21 LAB — BASIC METABOLIC PANEL
Anion gap: 11 (ref 5–15)
BUN: 15 mg/dL (ref 6–20)
CO2: 26 mmol/L (ref 22–32)
Calcium: 9.2 mg/dL (ref 8.9–10.3)
Chloride: 101 mmol/L (ref 98–111)
Creatinine, Ser: 0.8 mg/dL (ref 0.61–1.24)
GFR calc Af Amer: >60 mL/min (ref >60)
GFR calc non Af Amer: >60 mL/min (ref >60)
Glucose, Bld: 97 mg/dL (ref 70–99)
Potassium: 3.6 mmol/L (ref 3.5–5.1)
Sodium: 138 mmol/L (ref 135–145)

## 2019-08-21 LAB — D-DIMER, QUANTITATIVE: D-Dimer, Quant: 0.41 ug/mL-FEU (ref 0.00–0.50)

## 2019-08-21 LAB — SARS CORONAVIRUS 2 (TAT 6-24 HRS): SARS Coronavirus 2: NEGATIVE

## 2019-08-21 NOTE — ED Provider Notes (Signed)
Patient was initially seen by Dr. Rex Kras.  Please see her note.  ED work-up is reassuring.  D-dimer troponin laboratory tests are negative.  Patient has normal respiratory rate and oxygen saturation.  Considering the covid 19 pandemic, a COVID-19 laboratory test has been sent off for analysis.  Thomas Howell was evaluated in Emergency Department on 08/21/2019 for the symptoms described in the history of present illness. He was evaluated in the context of the global COVID-19 pandemic, which necessitated consideration that the patient might be at risk for infection with the SARS-CoV-2 virus that causes COVID-19. Institutional protocols and algorithms that pertain to the evaluation of patients at risk for COVID-19 are in a state of rapid change based on information released by regulatory bodies including the CDC and federal and state organizations. These policies and algorithms were followed during the patient's care in the ED.    Dorie Rank, MD 08/21/19 [phone]

## 2019-08-21 NOTE — ED Notes (Signed)
ED Provider at bedside. 

## 2019-08-21 NOTE — Progress Notes (Addendum)
  E-Visit for [employer] Virus Screening  Based on what you have shared with me, you need to seek an evaluation for a severe illness that is causing your symptoms which may be coronavirus or some other illness. I recommend that you be seen and evaluated "face to face". Our Emergency Departments are best equipped to handle patients with severe symptoms.  You will be evaluated by the ER provider (or higher level of care provider) who will determine whether you need formal testing.   I recommend the following:  . If you are having a true medical emergency please call 911. . If you are considered high risk for Corona virus because of a known exposure, fever, shortness of breath and cough, OR if you have severe symptoms of any kind, seek medical care at an emergency room.   . [facility] Emergency Department [address], [place], [place] [address] [phone]  . [facility] Emergency Department [place], [place], [place] [address] [phone]  . [facility] Emergency Department [place], [place], [place] [address] [phone]  . [facility] Emergency Department [address] [place], [place], [place] [address] [phone]  . [facility] Emergency Department [place], [place], [place] [address] [phone]  NOTE: If you entered your credit card information for this eVisit, you will not be charged. You may see a "hold" on your card for the $35 but that hold will drop off and you will not have a charge processed.   Your e-visit answers were reviewed by a board certified advanced clinical practitioner to complete your personal care plan.  Thank you for using e-Visits.  Approximately 5 minutes of time have been spent researching, coordinating, and implementing care for this patient today.

## 2019-08-21 NOTE — ED Provider Notes (Signed)
[facility]-EMERGENCY DEPT Provider Note   CSN: [id number] Arrival date & time: 08/21/19  1016     History   Chief Complaint Chief Complaint  Patient presents with  . Chest Pain  . Dizziness    HPI Thomas Howell is a 28 y.o. male.     28yo M w/ PMH including SVT s/p ablation, migraines, kidney stones who p/w chest pressure. PT was at work this morning when he began having chest pressure, feeling like he couldn't catch his breath. He has since developed dizziness, nausea, nasal congestion, and body aches. No known fever. No cough. He reports 5-hour car trip this past weekend. No leg swelling/pain. PT's boss at work had COVID-19 1 month ago but has been in quarantine since then.    Chest Pain Associated symptoms: dizziness   Dizziness Associated symptoms: chest pain     Past Medical History:  Diagnosis Date  . Complication of anesthesia   . Frequency-urgency syndrome   . History of kidney stones   . Hx of nausea and vomiting    d/t kidney stone  . Hx of sepsis 08/11/2017   due to kidney stone/ hydronephrosis  . Left ureteral stone   . Migraine   . Pain due to ureteral stent (HCC)   . PONV (postoperative nausea and vomiting)    none recently  . Renal calculi    bilateral per ct 07-26-2016  . Scoliosis of lumbar spine 1994   treated at [facility] until age 42  . Wears glasses     Patient Active Problem List   Diagnosis Date Noted  . Right ureteral stone 10/13/2018  . Acute pyelonephritis 08/14/2018  . Bilateral hydronephrosis 08/14/2018  . Sepsis (HCC) 08/14/2018  . SVT (supraventricular tachycardia) (HCC) 05/22/2018  . Attention deficit hyperactivity disorder (ADHD), combined type 04/08/2018  . Chronic migraine 02/14/2017  . Neck pain 02/04/2016  . Hydronephrosis, left 08/19/2015  . Hydronephrosis determined by ultrasound   . Kidney stone on left side 08/18/2015  . Nephrolithiasis 08/18/2015  . Kidney stone 07/06/2015  . Left ureteral  stone 07/06/2015  . Analgesic rebound headache 06/05/2014  . Migraine headache without aura 05/30/2014    Past Surgical History:  Procedure Laterality Date  . CYSTOSCOPY W/ RETROGRADES Right 07/06/2015   Procedure: CYSTOSCOPY WITH RETROGRADE PYELOGRAM;  Surgeon: Jerilee Field, MD;  Location: [facility];  Service: Urology;  Laterality: Right;  . CYSTOSCOPY W/ URETERAL STENT PLACEMENT Left 08/08/2015   Procedure: CYSTOSCOPY WITH STENT REPLACEMENT;  Surgeon: Jerilee Field, MD;  Location: [facility];  Service: Urology;  Laterality: Left;  . CYSTOSCOPY W/ URETERAL STENT PLACEMENT Left 02/21/2017   Procedure: CYSTOSCOPY WITH RETROGRADE PYELOGRAM/URETERAL LEFT STENT PLACEMENT WITH  LASER;  Surgeon: Bjorn Pippin, MD;  Location: [facility];  Service: Urology;  Laterality: Left;  . CYSTOSCOPY W/ URETERAL STENT PLACEMENT Left 08/10/2017   Procedure: CYSTOSCOPY WITH RETROGRADE PYELOGRAM/URETERAL STENT PLACEMENT;  Surgeon: Heloise Purpura, MD;  Location: [facility];  Service: Urology;  Laterality: Left;  . CYSTOSCOPY WITH RETROGRADE PYELOGRAM, URETEROSCOPY AND STENT PLACEMENT Left 06/24/2014   Procedure: CYSTOSCOPY WITH RETROGRADE PYELOGRAM,  AND STENT PLACEMENT;  Surgeon: Magdalene Molly, MD;  Location: [facility];  Service: Urology;  Laterality: Left;  . CYSTOSCOPY WITH RETROGRADE PYELOGRAM, URETEROSCOPY AND STENT PLACEMENT Left 07/05/2014   Procedure: CYSTO/LEFT URETEROSCOPY/LEFT RETROGRADE PYELOGRAM/LEFT STENT PLACEMENT;  Surgeon: Jerilee Field, MD;  Location: [facility];  Service: Urology;  Laterality: Left;  . CYSTOSCOPY WITH RETROGRADE PYELOGRAM, URETEROSCOPY AND  STENT PLACEMENT Left 07/06/2015   Procedure: CYSTOSCOPY WITH RETROGRADE PYELOGRAM, URETEROSCOPY , LASER, STENT PLACEMENT and BASKET EXTRACTION;  Surgeon: Festus Aloe, MD;  Location: [facility];  Service: Urology;  Laterality: Left;  . CYSTOSCOPY WITH RETROGRADE PYELOGRAM, URETEROSCOPY AND STENT PLACEMENT Left 08/19/2015    Procedure: CYSTOSCOPY WITH RETROGRADE PYELOGRAM, URETEROSCOPY AND STENT PLACEMENT;  Surgeon: Festus Aloe, MD;  Location: [facility];  Service: Urology;  Laterality: Left;  . CYSTOSCOPY WITH RETROGRADE PYELOGRAM, URETEROSCOPY AND STENT PLACEMENT Left 03/13/2018   Procedure: CYSTOSCOPY WITH RETROGRADE PYELOGRAM, URETEROSCOPY AND LEFT STENT PLACEMENT;  Surgeon: Irine Seal, MD;  Location: [facility];  Service: Urology;  Laterality: Left;  . CYSTOSCOPY WITH URETEROSCOPY AND STENT PLACEMENT Left 01/16/2016   Procedure: CYSTOSCOPY WITH LEFT RETROGRADE PYELOGRAM  LEFT DIGITAL URETEROSCOPY AND PLACEMENT LEFT URETERAL STENT;  Surgeon: Festus Aloe, MD;  Location: [facility];  Service: Urology;  Laterality: Left;  . CYSTOSCOPY WITH URETEROSCOPY, STONE BASKETRY AND STENT PLACEMENT Left 02/13/2016   Procedure: CYSTOSCOPY WITH LEFT URETEROSCOPY, HOLMIUM LASER AND STENT PLACEMENT;  Surgeon: Festus Aloe, MD;  Location: [facility];  Service: Urology;  Laterality: Left;  . CYSTOSCOPY/RETROGRADE/URETEROSCOPY/STONE EXTRACTION WITH BASKET Left 08/08/2015   Procedure: CYSTOSCOPY/RETROGRADE/URETEROSCOPY/STONE EXTRACTION WITH BASKET;  Surgeon: Festus Aloe, MD;  Location: [facility];  Service: Urology;  Laterality: Left;  . CYSTOSCOPY/URETEROSCOPY/HOLMIUM LASER/STENT PLACEMENT Left 07/30/2016   Procedure: CYSTOSCOPY/URETEROSCOPY/HOLMIUM LASER/STENT PLACEMENT;  Surgeon: Festus Aloe, MD;  Location: [facility];  Service: Urology;  Laterality: Left;  . CYSTOSCOPY/URETEROSCOPY/HOLMIUM LASER/STENT PLACEMENT Left 08/19/2017   Procedure: CYSTOSCOPY/URETEROSCOPY/HOLMIUM LASER/STENT PLACEMENT;  Surgeon: Festus Aloe, MD;  Location: [facility];  Service: Urology;  Laterality: Left;  . CYSTOSCOPY/URETEROSCOPY/HOLMIUM LASER/STENT PLACEMENT N/A 10/13/2018   Procedure: CYSTOSCOPY/RETROGRADE RIGHT URETEROSCOPY/ AND RIGHTSTENT PLACEMENT;  Surgeon: Irine Seal, MD;  Location: [facility];   Service: Urology;  Laterality: N/A;  . EXTRACORPOREAL SHOCK WAVE LITHOTRIPSY Left 07-07-2015  &  12-26-2014  . 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 HOLMIUM LASER APPLICATION Left 04/11/5360   Procedure: LASER LITHO;  Surgeon: Festus Aloe, MD;  Location: [facility];  Service: Urology;  Laterality: Left;  . HOLMIUM LASER APPLICATION Left 4/43/1540   Procedure: HOLMIUM LASER  WITH LITHOTRIPSY ;  Surgeon: Festus Aloe, MD;  Location: [facility];  Service: Urology;  Laterality: Left;  . HOLMIUM LASER APPLICATION Left 0/07/6760   Procedure: HOLMIUM LASER APPLICATION;  Surgeon: Irine Seal, MD;  Location: [facility];  Service: Urology;  Laterality: Left;  . HOLMIUM LASER APPLICATION Left 9/50/9326   Procedure: HOLMIUM LASER APPLICATION;  Surgeon: Irine Seal, MD;  Location: [facility];  Service: Urology;  Laterality: Left;  . LYMPH GLAND EXCISION  2003   neck--  benign  . SVT ABLATION N/A 05/22/2018   Procedure: SVT ABLATION;  Surgeon: Evans Lance, MD;  Location: [facility];  Service: Cardiovascular;  Laterality: N/A;        Home Medications    Prior to Admission medications   Medication Sig Start Date End Date Taking? Authorizing Provider  acetaminophen (TYLENOL) 500 MG tablet Take 1,000 mg by mouth every 6 (six) hours as needed for moderate pain.   Yes [provider]  butalbital-acetaminophen-caffeine (FIORICET, ESGIC) 50-325-40 MG tablet TAKE 1 TABLET BY MOUTH EVERY 6 HOURS AS NEEDED FOR HEADACHE Patient taking differently: Take 1 tablet by mouth every 6 (six) hours as needed for headache.  02/27/19  Yes Sater, Richard  A, MD  hydrochlorothiazide (HYDRODIURIL) 25 MG tablet Take 25 mg by mouth daily.    Yes [provider]  lamoTRIgine (LAMICTAL) 100 MG tablet TAKE 1 TABLET(100 MG) BY MOUTH TWICE DAILY Patient taking differently: Take 100 mg by mouth 2 (two) times  daily.  07/26/19  Yes Sater, Pearletha Furlichard A, MD  levETIRAcetam (KEPPRA) 750 MG tablet TAKE 1 TABLET BY MOUTH EVERY MORNING AND 2 TABLETS EVERY NIGHT AT BEDTIME Patient taking differently: Take 750-1,500 mg by mouth See admin instructions. TAKE 1 TABLET BY MOUTH EVERY MORNING AND 2 TABLETS EVERY NIGHT AT BEDTIME 02/20/19  Yes Sater, Pearletha Furlichard A, MD  rizatriptan (MAXALT-MLT) 10 MG disintegrating tablet TAKE 1 TABLET BY MOUTH AS NEEDED MAY REPEAT IN 2 HOURS IF NEEDED Patient taking differently: Take 10 mg by mouth as needed for migraine. TAKE 1 TABLET BY MOUTH AS NEEDED MAY REPEAT IN 2 HOURS IF NEEDED 07/10/19  Yes Lomax, Amy, NP  AIMOVIG 140 MG/ML SOAJ ADMINISTER 1 ML UNDER THE SKIN EVERY 28 DAYS Patient taking differently: Inject 140 mg into the skin every 28 (twenty-eight) days.  08/20/19   Lomax, Amy, NP  HYDROcodone-acetaminophen (NORCO/VICODIN) 5-325 MG tablet Take one tab po q 4 hrs prn pain Patient not taking: Reported on 08/21/2019 11/15/18   Triplett, Tammy, PA-C  ibuprofen (ADVIL,MOTRIN) 800 MG tablet Take 1 tablet (800 mg total) by mouth 3 (three) times daily. Take with food Patient not taking: Reported on 08/21/2019 11/15/18   Triplett, Tammy, PA-C  ondansetron (ZOFRAN ODT) 4 MG disintegrating tablet Take 1 tablet (4 mg total) by mouth every 8 (eight) hours as needed for nausea or vomiting. Patient not taking: Reported on 08/21/2019 10/22/18   Gerhard MunchLockwood, Robert, MD    Family History Family History  Problem Relation Age of Onset  . Cancer Father   . Kidney Stones Mother   . Cancer Other   . Hypertension Other   . Hyperlipidemia Other   . Stroke Other   . Kidney Stones Brother     Social History Social History   Tobacco Use  . Smoking status: Former Smoker    Years: 2.00    Quit date: 07/29/2014    Years since quitting: 5.0  . Smokeless tobacco: Never Used  . Tobacco comment: social smoker  Substance Use Topics  . Alcohol use: Yes    Alcohol/week: 0.0 standard drinks    Comment: occ  .  Drug use: No     Allergies   Bactrim [sulfamethoxazole-trimethoprim], Codeine, and Adhesive [tape]   Review of Systems Review of Systems  Cardiovascular: Positive for chest pain.  Neurological: Positive for dizziness.  All other systems reviewed and are negative except that which was mentioned in HPI    Physical Exam Updated Vital Signs BP (!) 135/93 (BP Location: Right Arm)   Pulse 70   Temp 98.2 F (36.8 C) (Oral)   Resp 16   SpO2 99%   Physical Exam   ED Treatments / Results  Labs (all labs ordered are listed, but only abnormal results are displayed) Labs Reviewed  SARS CORONAVIRUS 2 (TAT 6-24 HRS)  CBC  D-DIMER, QUANTITATIVE (NOT [facility])  BASIC METABOLIC PANEL  TROPONIN I (HIGH SENSITIVITY)  TROPONIN I (HIGH SENSITIVITY)    EKG EKG Interpretation  Date/Time:  Tuesday August 21 2019 10:28:54 EDT Ventricular Rate:  83 PR Interval:    QRS Duration: 102 QT Interval:  373 QTC Calculation: 439 R Axis:   -32 Text Interpretation:  Sinus rhythm Borderline short PR  interval Left axis deviation No significant change since last tracing Confirmed by Frederick PeersLittle, Chong January [phone]([id number]) on 08/21/2019 1:51:01 PM   Radiology Dg Chest 2 View  Result Date: 08/21/2019 CLINICAL DATA:  Acute onset mid chest pain and dizziness this morning. EXAM: CHEST - 2 VIEW COMPARISON:  PA and lateral chest 08/19/2017. FINDINGS: The lungs are clear. Heart size is normal. No pneumothorax or pleural effusion. No acute bony abnormality. Convex left lumbar scoliosis noted. IMPRESSION: No acute disease. Electronically Signed   By: Drusilla Kannerhomas  Dalessio M.D.   On: 08/21/2019 11:09    Procedures Procedures (including critical care time)  Medications Ordered in ED Medications - No data to display   Initial Impression / Assessment and Plan / ED Course  I have reviewed the triage vital signs and the nursing notes.  Pertinent labs & imaging results that were available during my care of the patient were  reviewed by me and considered in my medical decision making (see chart for details).       Comfortable on exam, reassuring vital signs, afebrile with normal O2 saturation.  Differential includes COVID-19 given current pandemic, less likely PE however because of his recent car travel will obtain d-dimer.  No risk factors for ACS.  Lab work thus far shows negative d-dimer and normal CBC.  Chest x-ray is clear.  Have sent COVID screening and explained that it will take several days to result.  I am signing out to the oncoming provider pending completion of his lab work.  If the remainder of his lab work is normal, I anticipate discharge home as patient has HEART score < 3 and story is not highly suggestive of ACS. Final Clinical Impressions(s) / ED Diagnoses   Final diagnoses:  None    ED Discharge Orders    None       Marck Mcclenny, Ambrose Finlandachel Morgan, MD 08/21/19 1547

## 2019-08-21 NOTE — ED Triage Notes (Signed)
Pt reports while at work today felt chest pressure and dizziness. Hx SVT. Was recently at the beach and also have nasal congestion.

## 2019-09-25 ENCOUNTER — Other Ambulatory Visit: Payer: Self-pay | Admitting: Neurology

## 2019-09-26 NOTE — Telephone Encounter (Signed)
Pt is due for a refill on fioricet. Pt is up to date on his appts. Will send to Dr. Felecia Shelling for approval.

## 2019-10-01 ENCOUNTER — Telehealth: Payer: Self-pay

## 2019-10-01 NOTE — Telephone Encounter (Signed)
Pending renewal for Aimovig 140 mg Key: [id number] ICD 10 code: Q76.226 and G43.009     Aimovig was approved through 09-26-2020. A copy of the approval letter has been faxed to the patient's pharmacy listed below. Confirmation fax has been received. Patient has been notified via [employer].

## 2019-10-01 NOTE — Telephone Encounter (Signed)
[facility] [place], [address] AT [address] [phone] (Phone) [phone] (Fax)

## 2019-10-02 ENCOUNTER — Other Ambulatory Visit: Payer: Self-pay

## 2019-10-02 ENCOUNTER — Encounter: Payer: Self-pay | Admitting: Family Medicine

## 2019-10-02 ENCOUNTER — Ambulatory Visit: Payer: 59 | Admitting: Family Medicine

## 2019-10-02 VITALS — BP 116/76 | HR 80 | Temp 97.9°F | Ht 68.0 in | Wt 236.0 lb

## 2019-10-02 DIAGNOSIS — M542 Cervicalgia: Secondary | ICD-10-CM

## 2019-10-02 DIAGNOSIS — G43009 Migraine without aura, not intractable, without status migrainosus: Secondary | ICD-10-CM

## 2019-10-02 MED ORDER — GABAPENTIN 100 MG PO CAPS: 100.0000 mg | ORAL_CAPSULE | Freq: Three times a day (TID) | ORAL | 11 refills | Status: DC
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
## 2019-10-02 NOTE — Patient Instructions (Signed)
We will continue lamictal, keppra and Amovig. Add gabapentin 100mg  three times daily (start with once daily dosing and titrate up to three times daily as directed)   Continue Maxalt for abortive therapy  Consider PT for dry needling, or muscle relaxer. May need to look at neck pathology if headaches continue  Follow up in 3 months, sooner if needed   Neck Exercises Ask your health care provider which exercises are safe for you. Do exercises exactly as told by your health care provider and adjust them as directed. It is normal to feel mild stretching, pulling, tightness, or discomfort as you do these exercises. Stop right away if you feel sudden pain or your pain gets worse. Do not begin these exercises until told by your health care provider. Neck exercises can be important for many reasons. They can improve strength and maintain flexibility in your neck, which will help your upper back and prevent neck pain. Stretching exercises Rotation neck stretching  1. Sit in a chair or stand up. 2. Place your feet flat on the floor, shoulder width apart. 3. Slowly turn your head (rotate) to the right until a slight stretch is felt. Turn it all the way to the right so you can look over your right shoulder. Do not tilt or tip your head. 4. Hold this position for 10-30 seconds. 5. Slowly turn your head (rotate) to the left until a slight stretch is felt. Turn it all the way to the left so you can look over your left shoulder. Do not tilt or tip your head. 6. Hold this position for 10-30 seconds. Repeat __________ times. Complete this exercise __________ times a day. Neck retraction 1. Sit in a sturdy chair or stand up. 2. Look straight ahead. Do not bend your neck. 3. Use your fingers to push your chin backward (retraction). Do not bend your neck for this movement. Continue to face straight ahead. If you are doing the exercise properly, you will feel a slight sensation in your throat and a stretch at the  back of your neck. 4. Hold the stretch for 1-2 seconds. Repeat __________ times. Complete this exercise __________ times a day. Strengthening exercises Neck press 1. Lie on your back on a firm bed or on the floor with a pillow under your head. 2. Use your neck muscles to push your head down on the pillow and straighten your spine. 3. Hold the position as well as you can. Keep your head facing up (in a neutral position) and your chin tucked. 4. Slowly count to 5 while holding this position. Repeat __________ times. Complete this exercise __________ times a day. Isometrics These are exercises in which you strengthen the muscles in your neck while keeping your neck still (isometrics). 1. Sit in a supportive chair and place your hand on your forehead. 2. Keep your head and face facing straight ahead. Do not flex or extend your neck while doing isometrics. 3. Push forward with your head and neck while pushing back with your hand. Hold for 10 seconds. 4. Do the sequence again, this time putting your hand against the back of your head. Use your head and neck to push backward against the hand pressure. 5. Finally, do the same exercise on either side of your head, pushing sideways against the pressure of your hand. Repeat __________ times. Complete this exercise __________ times a day. Prone head lifts 1. Lie face-down (prone position), resting on your elbows so that your chest and upper back are  raised. 2. Start with your head facing downward, near your chest. Position your chin either on or near your chest. 3. Slowly lift your head upward. Lift until you are looking straight ahead. Then continue lifting your head as far back as you can comfortably stretch. 4. Hold your head up for 5 seconds. Then slowly lower it to your starting position. Repeat __________ times. Complete this exercise __________ times a day. Supine head lifts 1. Lie on your back (supine position), bending your knees to point to  the ceiling and keeping your feet flat on the floor. 2. Lift your head slowly off the floor, raising your chin toward your chest. 3. Hold for 5 seconds. Repeat __________ times. Complete this exercise __________ times a day. Scapular retraction 1. Stand with your arms at your sides. Look straight ahead. 2. Slowly pull both shoulders (scapulae) backward and downward (retraction) until you feel a stretch between your shoulder blades in your upper back. 3. Hold for 10-30 seconds. 4. Relax and repeat. Repeat __________ times. Complete this exercise __________ times a day. Contact a health care provider if:  Your neck pain or discomfort gets much worse when you do an exercise.  Your neck pain or discomfort does not improve within 2 hours after you exercise. If you have any of these problems, stop exercising right away. Do not do the exercises again unless your health care provider says that you can. Get help right away if:  You develop sudden, severe neck pain. If this happens, stop exercising right away. Do not do the exercises again unless your health care provider says that you can. This information is not intended to replace advice given to you by your health care provider. Make sure you discuss any questions you have with your health care provider. Document Released: 11/17/2015 Document Revised: 10/04/2018 Document Reviewed: 10/04/2018 Elsevier Patient Education  2020 [employer].   Migraine Headache A migraine headache is a very strong throbbing pain on one side or both sides of your head. This type of headache can also cause other symptoms. It can last from 4 hours to 3 days. Talk with your doctor about what things may bring on (trigger) this condition. What are the causes? The exact cause of this condition is not known. This condition may be triggered or caused by:  Drinking alcohol.  Smoking.  Taking medicines, such as: ? Medicine used to treat chest pain (nitroglycerin). ?  Birth control pills. ? Estrogen. ? Some blood pressure medicines.  Eating or drinking certain products.  Doing physical activity. Other things that may trigger a migraine headache include:  Having a menstrual period.  Pregnancy.  Hunger.  Stress.  Not getting enough sleep or getting too much sleep.  Weather changes.  Tiredness (fatigue). What increases the risk?  Being 25-68 years old.  Being male.  Having a family history of migraine headaches.  Being Caucasian.  Having depression or anxiety.  Being very overweight. What are the signs or symptoms?  A throbbing pain. This pain may: ? Happen in any area of the head, such as on one side or both sides. ? Make it hard to do daily activities. ? Get worse with physical activity. ? Get worse around bright lights or loud noises.  Other symptoms may include: ? Feeling sick to your stomach (nauseous). ? Vomiting. ? Dizziness. ? Being sensitive to bright lights, loud noises, or smells.  Before you get a migraine headache, you may get warning signs (an aura). An aura may  include: ? Seeing flashing lights or having blind spots. ? Seeing bright spots, halos, or zigzag lines. ? Having tunnel vision or blurred vision. ? Having numbness or a tingling feeling. ? Having trouble talking. ? Having weak muscles.  Some people have symptoms after a migraine headache (postdromal phase), such as: ? Tiredness. ? Trouble thinking (concentrating). How is this treated?  Taking medicines that: ? Relieve pain. ? Relieve the feeling of being sick to your stomach. ? Prevent migraine headaches.  Treatment may also include: ? Having acupuncture. ? Avoiding foods that bring on migraine headaches. ? Learning ways to control your body functions (biofeedback). ? Therapy to help you know and deal with negative thoughts (cognitive behavioral therapy). Follow these instructions at home: Medicines  Take over-the-counter and  prescription medicines only as told by your doctor.  Ask your doctor if the medicine prescribed to you: ? Requires you to avoid driving or using heavy machinery. ? Can cause trouble pooping (constipation). You may need to take these steps to prevent or treat trouble pooping:  Drink enough fluid to keep your pee (urine) pale yellow.  Take over-the-counter or prescription medicines.  Eat foods that are high in fiber. These include beans, whole grains, and fresh fruits and vegetables.  Limit foods that are high in fat and sugar. These include fried or sweet foods. Lifestyle  Do not drink alcohol.  Do not use any products that contain nicotine or tobacco, such as cigarettes, e-cigarettes, and chewing tobacco. If you need help quitting, ask your doctor.  Get at least 8 hours of sleep every night.  Limit and deal with stress. General instructions      Keep a journal to find out what may bring on your migraine headaches. For example, write down: ? What you eat and drink. ? How much sleep you get. ? Any change in what you eat or drink. ? Any change in your medicines.  If you have a migraine headache: ? Avoid things that make your symptoms worse, such as bright lights. ? It may help to lie down in a dark, quiet room. ? Do not drive or use heavy machinery. ? Ask your doctor what activities are safe for you.  Keep all follow-up visits as told by your doctor. This is important. Contact a doctor if:  You get a migraine headache that is different or worse than others you have had.  You have more than 15 headache days in one month. Get help right away if:  Your migraine headache gets very bad.  Your migraine headache lasts longer than 72 hours.  You have a fever.  You have a stiff neck.  You have trouble seeing.  Your muscles feel weak or like you cannot control them.  You start to lose your balance a lot.  You start to have trouble walking.  You pass out (faint).   You have a seizure. Summary  A migraine headache is a very strong throbbing pain on one side or both sides of your head. These headaches can also cause other symptoms.  This condition may be treated with medicines and changes to your lifestyle.  Keep a journal to find out what may bring on your migraine headaches.  Contact a doctor if you get a migraine headache that is different or worse than others you have had.  Contact your doctor if you have more than 15 headache days in a month. This information is not intended to replace advice given to you by your  health care provider. Make sure you discuss any questions you have with your health care provider. Document Released: 09/14/2008 Document Revised: 03/30/2019 Document Reviewed: 01/18/2019 Elsevier Patient Education  2020 [employer].

## 2019-10-07 ENCOUNTER — Encounter: Payer: Self-pay | Admitting: Family Medicine

## 2019-10-08 NOTE — Progress Notes (Signed)
I have read the note, and I agree with the clinical assessment and plan.  Cedra Villalon A. Ruthvik Barnaby, MD, PhD, FAAN Certified in Neurology, Clinical Neurophysiology, Sleep Medicine, Pain Medicine and Neuroimaging  [facility] [address], [address] [place], [place] [address] [phone]  

## 2019-10-09 ENCOUNTER — Other Ambulatory Visit: Payer: Self-pay | Admitting: *Deleted

## 2019-10-09 DIAGNOSIS — Z20822 Contact with and (suspected) exposure to covid-19: Secondary | ICD-10-CM

## 2019-10-11 LAB — NOVEL CORONAVIRUS, NAA: SARS-CoV-2, NAA: NOT DETECTED

## 2019-10-12 ENCOUNTER — Other Ambulatory Visit: Payer: Self-pay

## 2019-10-12 DIAGNOSIS — Z20822 Contact with and (suspected) exposure to covid-19: Secondary | ICD-10-CM

## 2019-10-13 LAB — NOVEL CORONAVIRUS, NAA: SARS-CoV-2, NAA: NOT DETECTED

## 2019-10-15 ENCOUNTER — Telehealth: Payer: 59 | Admitting: Family

## 2019-10-15 DIAGNOSIS — L03119 Cellulitis of unspecified part of limb: Secondary | ICD-10-CM | POA: Diagnosis not present

## 2019-10-15 MED ORDER — CEPHALEXIN 500 MG PO CAPS: 500.0000 mg | ORAL_CAPSULE | Freq: Four times a day (QID) | ORAL | 0 refills | Status: DC

## 2019-10-15 NOTE — Progress Notes (Signed)
E Visit for Cellulitis  We are sorry that you are not feeling well. Here is how we plan to help!  Based on what you shared with me it looks like you have cellulitis.  Cellulitis looks like areas of skin redness, swelling, and warmth; it develops as a result of bacteria entering under the skin. Little red spots and/or bleeding can be seen in skin, and tiny surface sacs containing fluid can occur. Fever can be present. Cellulitis is almost always on one side of a body, and the lower limbs are the most common site of involvement.   I have prescribed:  Keflex 500mg  orally, 4 times a day for 5 days  HOME CARE:  . Take your medications as ordered and take all of them, even if the skin irritation appears to be healing.   GET HELP RIGHT AWAY IF:  . Symptoms that don't begin to go away within 48 hours. . Severe redness persists or worsens . If the area turns color, spreads or swells. . If it blisters and opens, develops yellow-brown crust or bleeds. . You develop a fever or chills. . If the pain increases or becomes unbearable.  . Are unable to keep fluids and food down.  MAKE SURE YOU    Understand these instructions.  Will watch your condition.  Will get help right away if you are not doing well or get worse.  Thank you for choosing an e-visit. Your e-visit answers were reviewed by a board certified advanced clinical practitioner to complete your personal care plan. Depending upon the condition, your plan could have included both over the counter or prescription medications. Please review your pharmacy choice. Make sure the pharmacy is open so you can pick up prescription now. If there is a problem, you may contact your provider through [employer] and have the prescription routed to another pharmacy. Your safety is important to [place]. If you have drug allergies check your prescription carefully.  For the next 24 hours you can use MyChart to ask questions about today's visit, request a  non-urgent call back, or ask for a work or school excuse. You will get an email in the next two days asking about your experience. I hope that your e-visit has been valuable and will speed your recovery.  Greater than 5 minutes, yet less than 10 minutes of time have been spent researching, coordinating, and implementing care for this patient today.  Thank you for the details you included in the comment boxes. Those details are very helpful in determining the best course of treatment for you and help [place] to provide the best care.

## 2019-10-28 ENCOUNTER — Other Ambulatory Visit: Payer: Self-pay | Admitting: Neurology

## 2019-11-02 ENCOUNTER — Encounter: Payer: Self-pay | Admitting: Family Medicine

## 2019-11-05 ENCOUNTER — Other Ambulatory Visit: Payer: Self-pay | Admitting: Family Medicine

## 2019-11-05 MED ORDER — PROPRANOLOL HCL 20 MG PO TABS: 20.0000 mg | ORAL_TABLET | Freq: Two times a day (BID) | ORAL | 6 refills | Status: DC

## 2020-01-02 ENCOUNTER — Encounter: Payer: Self-pay | Admitting: Family Medicine

## 2020-01-02 ENCOUNTER — Other Ambulatory Visit: Payer: Self-pay

## 2020-01-02 ENCOUNTER — Ambulatory Visit: Payer: 59 | Admitting: Family Medicine

## 2020-01-02 VITALS — BP 122/71 | HR 83 | Temp 97.3°F | Ht 68.0 in | Wt 244.2 lb

## 2020-01-02 DIAGNOSIS — G43009 Migraine without aura, not intractable, without status migrainosus: Secondary | ICD-10-CM | POA: Diagnosis not present

## 2020-01-02 NOTE — Patient Instructions (Addendum)
Continue current treatment regimen.   Continue follow up with PCP for neck pain  Hydration, healthy diet and regular exercise advised   Follow up with me in 1 year  Migraine Headache A migraine headache is a very strong throbbing pain on one side or both sides of your head. This type of headache can also cause other symptoms. It can last from 4 hours to 3 days. Talk with your doctor about what things may bring on (trigger) this condition. What are the causes? The exact cause of this condition is not known. This condition may be triggered or caused by:  Drinking alcohol.  Smoking.  Taking medicines, such as: ? Medicine used to treat chest pain (nitroglycerin). ? Birth control pills. ? Estrogen. ? Some blood pressure medicines.  Eating or drinking certain products.  Doing physical activity. Other things that may trigger a migraine headache include:  Having a menstrual period.  Pregnancy.  Hunger.  Stress.  Not getting enough sleep or getting too much sleep.  Weather changes.  Tiredness (fatigue). What increases the risk?  Being 87-72 years old.  Being male.  Having a family history of migraine headaches.  Being Caucasian.  Having depression or anxiety.  Being very overweight. What are the signs or symptoms?  A throbbing pain. This pain may: ? Happen in any area of the head, such as on one side or both sides. ? Make it hard to do daily activities. ? Get worse with physical activity. ? Get worse around bright lights or loud noises.  Other symptoms may include: ? Feeling sick to your stomach (nauseous). ? Vomiting. ? Dizziness. ? Being sensitive to bright lights, loud noises, or smells.  Before you get a migraine headache, you may get warning signs (an aura). An aura may include: ? Seeing flashing lights or having blind spots. ? Seeing bright spots, halos, or zigzag lines. ? Having tunnel vision or blurred vision. ? Having numbness or a tingling  feeling. ? Having trouble talking. ? Having weak muscles.  Some people have symptoms after a migraine headache (postdromal phase), such as: ? Tiredness. ? Trouble thinking (concentrating). How is this treated?  Taking medicines that: ? Relieve pain. ? Relieve the feeling of being sick to your stomach. ? Prevent migraine headaches.  Treatment may also include: ? Having acupuncture. ? Avoiding foods that bring on migraine headaches. ? Learning ways to control your body functions (biofeedback). ? Therapy to help you know and deal with negative thoughts (cognitive behavioral therapy). Follow these instructions at home: Medicines  Take over-the-counter and prescription medicines only as told by your doctor.  Ask your doctor if the medicine prescribed to you: ? Requires you to avoid driving or using heavy machinery. ? Can cause trouble pooping (constipation). You may need to take these steps to prevent or treat trouble pooping:  Drink enough fluid to keep your pee (urine) pale yellow.  Take over-the-counter or prescription medicines.  Eat foods that are high in fiber. These include beans, whole grains, and fresh fruits and vegetables.  Limit foods that are high in fat and sugar. These include fried or sweet foods. Lifestyle  Do not drink alcohol.  Do not use any products that contain nicotine or tobacco, such as cigarettes, e-cigarettes, and chewing tobacco. If you need help quitting, ask your doctor.  Get at least 8 hours of sleep every night.  Limit and deal with stress. General instructions      Keep a journal to find out what may  bring on your migraine headaches. For example, write down: ? What you eat and drink. ? How much sleep you get. ? Any change in what you eat or drink. ? Any change in your medicines.  If you have a migraine headache: ? Avoid things that make your symptoms worse, such as bright lights. ? It may help to lie down in a dark, quiet  room. ? Do not drive or use heavy machinery. ? Ask your doctor what activities are safe for you.  Keep all follow-up visits as told by your doctor. This is important. Contact a doctor if:  You get a migraine headache that is different or worse than others you have had.  You have more than 15 headache days in one month. Get help right away if:  Your migraine headache gets very bad.  Your migraine headache lasts longer than 72 hours.  You have a fever.  You have a stiff neck.  You have trouble seeing.  Your muscles feel weak or like you cannot control them.  You start to lose your balance a lot.  You start to have trouble walking.  You pass out (faint).  You have a seizure. Summary  A migraine headache is a very strong throbbing pain on one side or both sides of your head. These headaches can also cause other symptoms.  This condition may be treated with medicines and changes to your lifestyle.  Keep a journal to find out what may bring on your migraine headaches.  Contact a doctor if you get a migraine headache that is different or worse than others you have had.  Contact your doctor if you have more than 15 headache days in a month. This information is not intended to replace advice given to you by your health care provider. Make sure you discuss any questions you have with your health care provider. Document Revised: 03/30/2019 Document Reviewed: 01/18/2019 Elsevier Patient Education  [employer].
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
## 2020-01-03 NOTE — Progress Notes (Signed)
I have read the note, and I agree with the clinical assessment and plan.  Siah Kannan A. Shalyn Koral, MD, PhD, FAAN Certified in Neurology, Clinical Neurophysiology, Sleep Medicine, Pain Medicine and Neuroimaging  [facility] [address], [address] [place], [place] [address] [phone]  

## 2020-01-21 ENCOUNTER — Encounter: Payer: Self-pay | Admitting: Family Medicine

## 2020-02-08 ENCOUNTER — Ambulatory Visit: Payer: 59 | Admitting: Family Medicine

## 2020-02-08 ENCOUNTER — Encounter: Payer: Self-pay | Admitting: Family Medicine

## 2020-02-08 ENCOUNTER — Other Ambulatory Visit: Payer: Self-pay

## 2020-02-08 VITALS — BP 118/74 | Temp 97.5°F | Ht 68.0 in | Wt 245.0 lb

## 2020-02-08 DIAGNOSIS — M549 Dorsalgia, unspecified: Secondary | ICD-10-CM

## 2020-02-08 MED ORDER — TIZANIDINE HCL 4 MG PO TABS: 4.0000 mg | ORAL_TABLET | Freq: Four times a day (QID) | ORAL | 0 refills | Status: DC | PRN

## 2020-02-08 NOTE — Progress Notes (Signed)
   Subjective:    Patient ID: Thomas Howell, male    DOB: 05-Jan-1991, 29 y.o.   MRN: [id number]  HPIpt would like a referral for upper back pain and neck pain.   Upper back pain for about 8 months.   Neck pain for about 8 months also.   Tried tylenol and motrin without relief.   Pt on hctz ofor  Kid stones  Recently started  amovig    Works at [employer], t the interventional office,,  Upper back pain, progressive in nature   Told his [facility] Works with pt o the heafaches  Pain in the uper neck and now radiating into th shoulder 8 mos to yr Burning sensation deep, betweeh shoulder blades, more on the right side   Has tried massages, bak support  Had knee board accient back in the summer  Pt works standing primarily, wears a lead skirt and thyroid skirt, runs the fluoroscopy element,    Does lean forward with work a lot   Does chair administered self-massage   Patient patient still dealing with chronic migraine headaches and seeing specialist for this.  Also sees urologist for intermittent kidney stones although overall stable at this time Review of Systems , no major weight loss or weight gain, no chest pain abdominal pain no change in bowel habits complete ROS otherwise negative     Objective:   Physical Exam  Alert and oriented, vitals reviewed and stable, NAD ENT-TM's and ext canals WNL bilat via otoscopic exam Soft palate, tonsils and post pharynx WNL via oropharyngeal exam Neck-symmetric, no masses; thyroid nonpalpable and nontender Pulmonary-no tachypnea or accessory muscle use; Clear without wheezes via auscultation Card--no abnrml murmurs, rhythm reg and rate WNL Carotid pulses symmetric, without bruits Thoracic curvature evident on exam.  Positive paraspinal tenderness to palpation right upper mid thorax.      Assessment & Plan:  Impression chronic midthoracic paraspinal primarily right-sided pain.  Likely  myofascial.  Discussed.  X-ray couple years ago confirmed thoracic scoliosis.  This could be contributing.  Also patient hunched over a bit in his activity as a clinical work-up.  Also contributing.  Recommend physical therapy.  Rationale discussed.  As needed anti-inflammatory medication.  Also referral to back specialist with history of scoliosis  Greater than 50% of this 30 minute face to face visit was spent in counseling and discussion and coordination of care regarding the above diagnosis/diagnosies

## 2020-02-11 ENCOUNTER — Telehealth: Payer: Self-pay

## 2020-02-11 MED ORDER — AIMOVIG 140 MG/ML ~~LOC~~ SOAJ: 140.0000 mg | SUBCUTANEOUS | 5 refills | Status: DC

## 2020-02-11 NOTE — Telephone Encounter (Signed)
New script has been sent to the pharmacy.  

## 2020-02-21 ENCOUNTER — Encounter: Payer: Self-pay | Admitting: Family Medicine

## 2020-02-25 ENCOUNTER — Other Ambulatory Visit: Payer: Self-pay | Admitting: Family Medicine

## 2020-02-26 ENCOUNTER — Ambulatory Visit (HOSPITAL_COMMUNITY): Payer: 59 | Attending: Family Medicine

## 2020-02-26 ENCOUNTER — Encounter (HOSPITAL_COMMUNITY): Payer: Self-pay

## 2020-02-26 ENCOUNTER — Other Ambulatory Visit: Payer: Self-pay

## 2020-02-26 DIAGNOSIS — M546 Pain in thoracic spine: Secondary | ICD-10-CM | POA: Insufficient documentation

## 2020-02-26 DIAGNOSIS — R29898 Other symptoms and signs involving the musculoskeletal system: Secondary | ICD-10-CM | POA: Insufficient documentation
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
## 2020-02-27 ENCOUNTER — Encounter: Payer: Self-pay | Admitting: Family Medicine

## 2020-02-27 IMAGING — CR DG CHEST 2V
2 series · 2 of 2 positions shown · non-contrast
Comparison: PA and lateral chest 08/19/2017.

CLINICAL DATA: Acute onset mid chest pain and dizziness this
morning.

EXAM:
CHEST - 2 VIEW

[w chest pa]
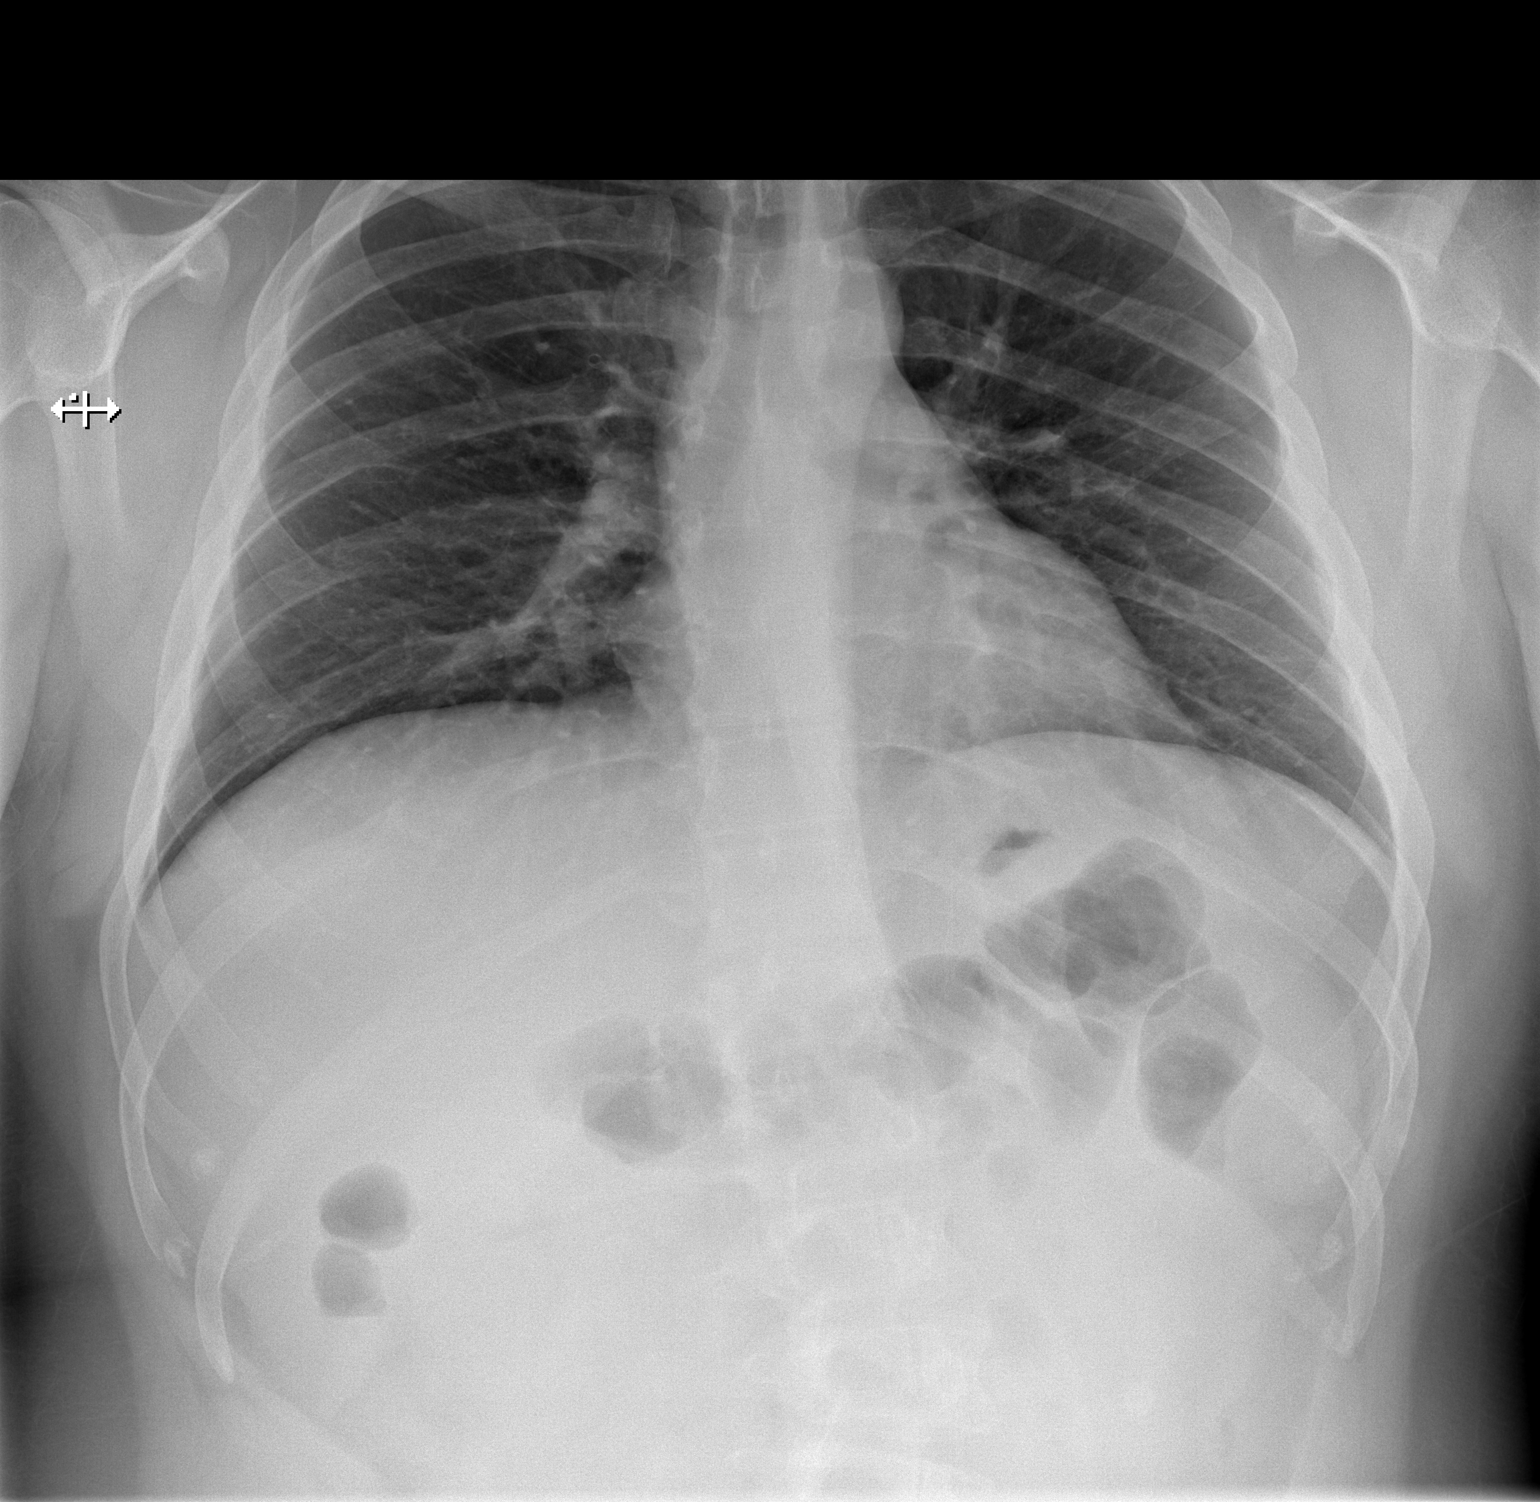

[w chest lat]
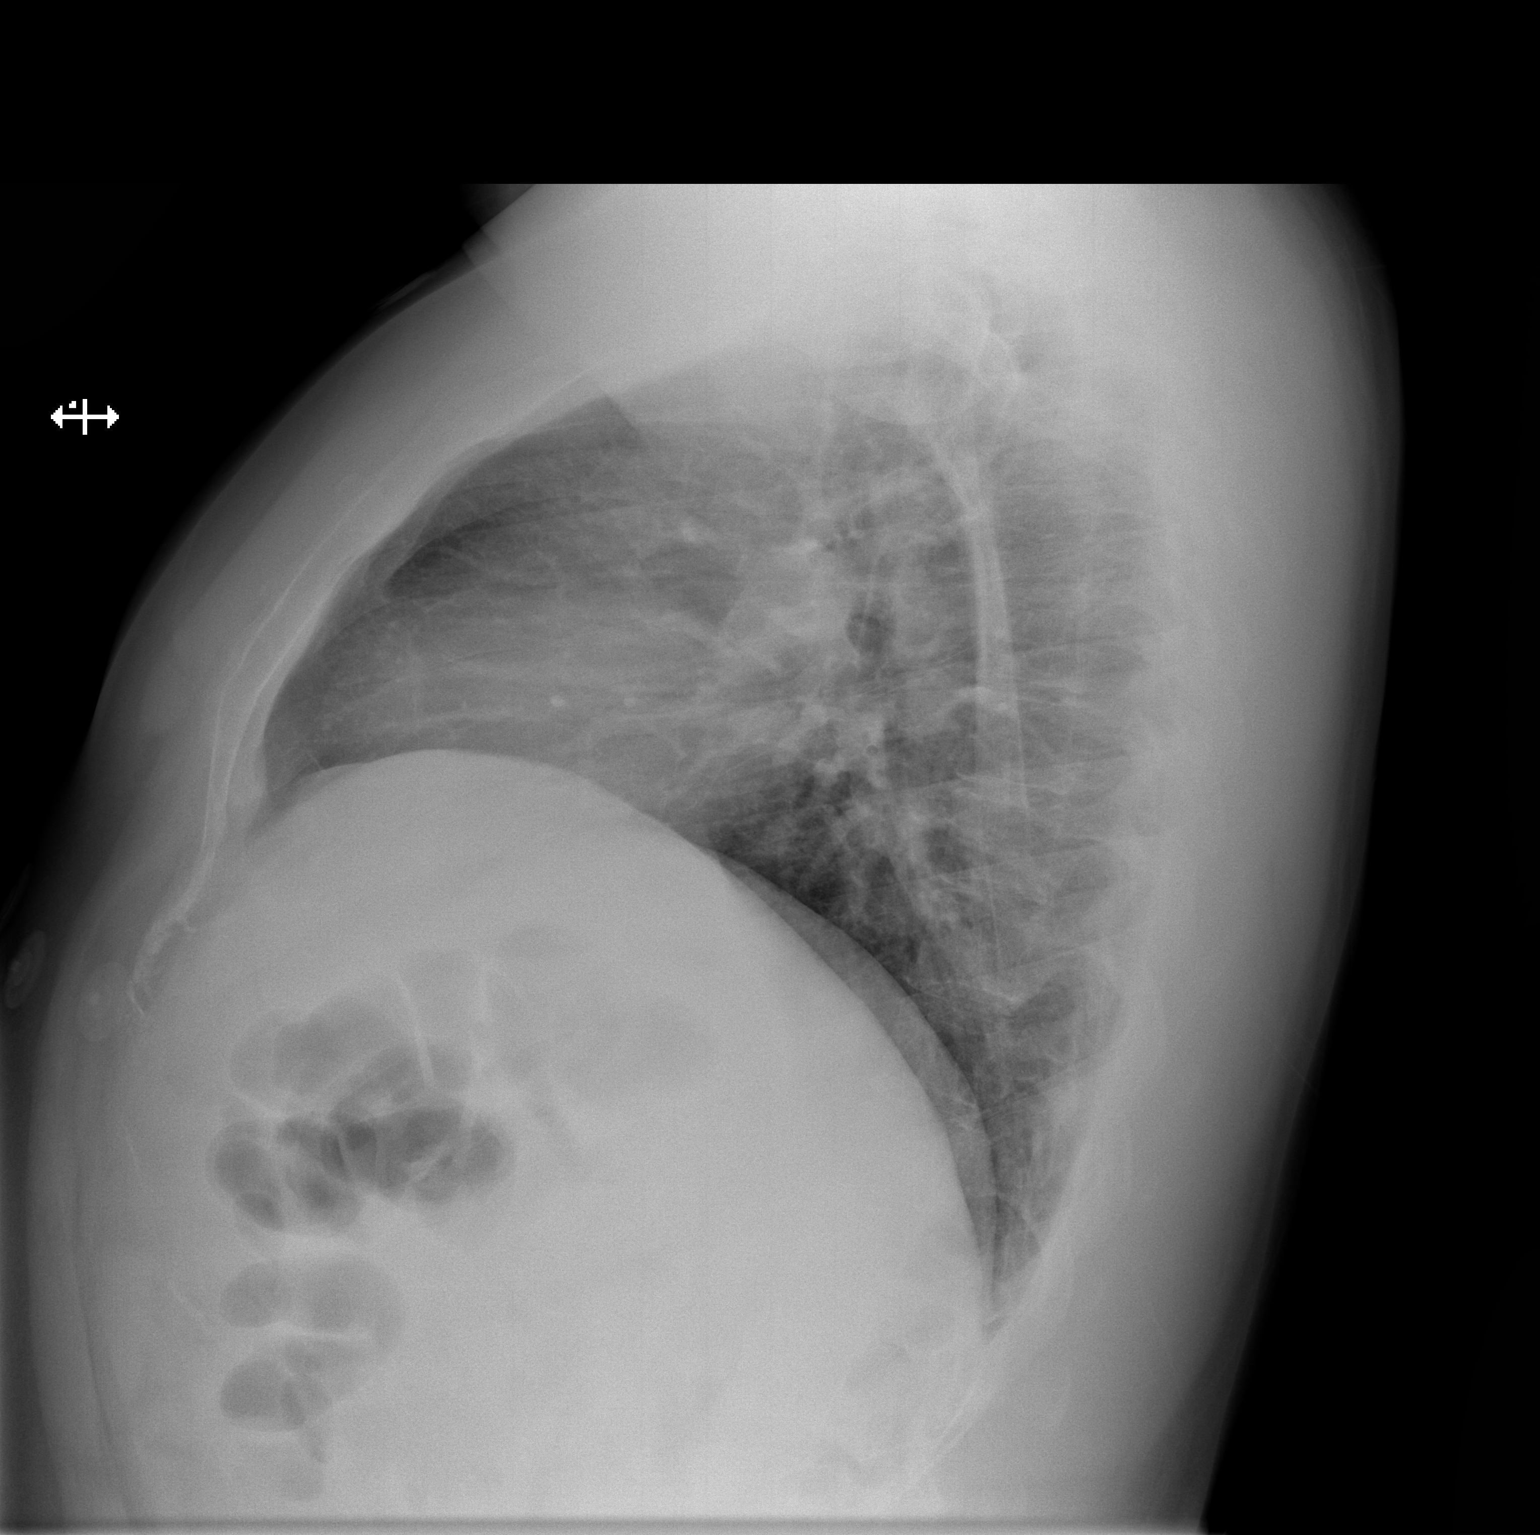

[2 of 2 positions shown; findings below may reference images not displayed]

FINDINGS: The lungs are clear. Heart size is normal. No pneumothorax or
pleural effusion. No acute bony abnormality. Convex left lumbar
scoliosis noted.
IMPRESSION: No acute disease.

## 2020-02-28 MED ORDER — GABAPENTIN 300 MG PO CAPS: 300.0000 mg | ORAL_CAPSULE | Freq: Two times a day (BID) | ORAL | 3 refills | Status: DC

## 2020-03-04 ENCOUNTER — Ambulatory Visit (INDEPENDENT_AMBULATORY_CARE_PROVIDER_SITE_OTHER): Payer: 59 | Admitting: Family Medicine

## 2020-03-04 ENCOUNTER — Ambulatory Visit: Payer: 59 | Admitting: Family Medicine

## 2020-03-04 ENCOUNTER — Other Ambulatory Visit: Payer: Self-pay

## 2020-03-04 DIAGNOSIS — F902 Attention-deficit hyperactivity disorder, combined type: Secondary | ICD-10-CM | POA: Diagnosis not present

## 2020-03-04 MED ORDER — AMPHETAMINE-DEXTROAMPHET ER 20 MG PO CP24: 20.0000 mg | ORAL_CAPSULE | ORAL | 0 refills | Status: DC

## 2020-03-04 NOTE — Progress Notes (Signed)
   Subjective:  Audio only  Patient ID: Thomas Howell, male    DOB: 07-Oct-1991, 29 y.o.   MRN: [id number]  HPI  Patient being seen today for evaluation for his ADD. Patient has been on medication in the past for ADD and would like to discuss treatment.  Virtual Visit via Video Note  I connected with Thomas Howell on 03/04/20 at  3:00 PM EDT by a video enabled telemedicine application and verified that I am speaking with the correct person using two identifiers.  Location: Patient: home Provider: office   I discussed the limitations of evaluation and management by telemedicine and the availability of in person appointments. The patient expressed understanding and agreed to proceed.  History of Present Illness:    Observations/Objective:   Assessment and Plan:   Follow Up Instructions:    I discussed the assessment and treatment plan with the patient. The patient was provided an opportunity to ask questions and all were answered. The patient agreed with the plan and demonstrated an understanding of the instructions.   The patient was advised to call back or seek an in-person evaluation if the symptoms worsen or if the condition fails to improve as anticipated.  I provided 21 minutes of non-face-to-face time during this encounter.   Patient states that ADHD medication has definitely helped in the past.  Now back into an educational setting.  States he definitely needs the medication.  In the past took 2 rapid acting meds.   Review of Systems No headache no chest pain no shortness of breath    Objective:   Physical Exam  Virtual      Assessment & Plan:  Impression ADHD.  Discussed.  Options discussed.  Will reinitiate medication.  Call us in several weeks with status.  Advised patient we follow-up adult ADHD folks generally every 4 months

## 2020-03-05 ENCOUNTER — Encounter (HOSPITAL_COMMUNITY): Payer: Self-pay | Admitting: Physical Therapy

## 2020-03-05 ENCOUNTER — Ambulatory Visit (HOSPITAL_COMMUNITY): Payer: 59 | Admitting: Physical Therapy

## 2020-03-05 DIAGNOSIS — M546 Pain in thoracic spine: Secondary | ICD-10-CM

## 2020-03-05 DIAGNOSIS — R29898 Other symptoms and signs involving the musculoskeletal system: Secondary | ICD-10-CM

## 2020-03-05 NOTE — Therapy (Signed)
[facility] [address] [place], [place], [address] Phone: [phone]   Fax:  [phone]  Physical Therapy Treatment  Patient Details  Name: Thomas Howell MRN: [id number] Date of Birth: Jul 29, 1991 Referring Provider (PT): Ardyth Gal, MD   Encounter Date: 03/05/2020  PT End of Session - 03/05/20 1433    Visit Number  2    Number of Visits  8    Date for PT Re-Evaluation  03/25/20    Authorization Type  02/26/20 to 03/25/20, VL 23    Authorization Time Period  02/26/20 to 03/25/20    Authorization - Visit Number  2    Authorization - Number of Visits  23    Progress Note Due on Visit  8    PT Start Time  1440    PT Stop Time  1520    PT Time Calculation (min)  40 min    Activity Tolerance  Patient tolerated treatment well    Behavior During Therapy  [facility] for tasks assessed/performed       Past Medical History:  Diagnosis Date  . Complication of anesthesia   . Frequency-urgency syndrome   . History of kidney stones   . Hx of nausea and vomiting    d/t kidney stone  . Hx of sepsis 08/11/2017   due to kidney stone/ hydronephrosis  . Left ureteral stone   . Migraine   . Pain due to ureteral stent (HCC)   . PONV (postoperative nausea and vomiting)    none recently  . Renal calculi    bilateral per ct 07-26-2016  . Scoliosis of lumbar spine 1994   treated at [facility] until age 76  . Wears glasses     Past Surgical History:  Procedure Laterality Date  . CYSTOSCOPY W/ RETROGRADES Right 07/06/2015   Procedure: CYSTOSCOPY WITH RETROGRADE PYELOGRAM;  Surgeon: Jerilee Field, MD;  Location: [facility];  Service: Urology;  Laterality: Right;  . CYSTOSCOPY W/ URETERAL STENT PLACEMENT Left 08/08/2015   Procedure: CYSTOSCOPY WITH STENT REPLACEMENT;  Surgeon: Jerilee Field, MD;  Location: [facility];  Service: Urology;  Laterality: Left;  . CYSTOSCOPY W/ URETERAL STENT PLACEMENT Left 02/21/2017   Procedure: CYSTOSCOPY WITH  RETROGRADE PYELOGRAM/URETERAL LEFT STENT PLACEMENT WITH  LASER;  Surgeon: Bjorn Pippin, MD;  Location: [facility];  Service: Urology;  Laterality: Left;  . CYSTOSCOPY W/ URETERAL STENT PLACEMENT Left 08/10/2017   Procedure: CYSTOSCOPY WITH RETROGRADE PYELOGRAM/URETERAL STENT PLACEMENT;  Surgeon: Heloise Purpura, MD;  Location: [facility];  Service: Urology;  Laterality: Left;  . CYSTOSCOPY WITH RETROGRADE PYELOGRAM, URETEROSCOPY AND STENT PLACEMENT Left 06/24/2014   Procedure: CYSTOSCOPY WITH RETROGRADE PYELOGRAM,  AND STENT PLACEMENT;  Surgeon: Magdalene Molly, MD;  Location: [facility];  Service: Urology;  Laterality: Left;  . CYSTOSCOPY WITH RETROGRADE PYELOGRAM, URETEROSCOPY AND STENT PLACEMENT Left 07/05/2014   Procedure: CYSTO/LEFT URETEROSCOPY/LEFT RETROGRADE PYELOGRAM/LEFT STENT PLACEMENT;  Surgeon: Jerilee Field, MD;  Location: [facility];  Service: Urology;  Laterality: Left;  . CYSTOSCOPY WITH RETROGRADE PYELOGRAM, URETEROSCOPY AND STENT PLACEMENT Left 07/06/2015   Procedure: CYSTOSCOPY WITH RETROGRADE PYELOGRAM, URETEROSCOPY , LASER, STENT PLACEMENT and BASKET EXTRACTION;  Surgeon: Jerilee Field, MD;  Location: [facility];  Service: Urology;  Laterality: Left;  . CYSTOSCOPY WITH RETROGRADE PYELOGRAM, URETEROSCOPY AND STENT PLACEMENT Left 08/19/2015   Procedure: CYSTOSCOPY WITH RETROGRADE PYELOGRAM, URETEROSCOPY AND STENT PLACEMENT;  Surgeon: Jerilee Field, MD;  Location: [facility];  Service: Urology;  Laterality: Left;  . CYSTOSCOPY  WITH RETROGRADE PYELOGRAM, URETEROSCOPY AND STENT PLACEMENT Left 03/13/2018   Procedure: CYSTOSCOPY WITH RETROGRADE PYELOGRAM, URETEROSCOPY AND LEFT STENT PLACEMENT;  Surgeon: Irine Seal, MD;  Location: [facility];  Service: Urology;  Laterality: Left;  . CYSTOSCOPY WITH URETEROSCOPY AND STENT PLACEMENT Left 01/16/2016   Procedure: CYSTOSCOPY WITH LEFT RETROGRADE PYELOGRAM  LEFT DIGITAL URETEROSCOPY AND PLACEMENT LEFT URETERAL STENT;  Surgeon: Festus Aloe, MD;   Location: [facility];  Service: Urology;  Laterality: Left;  . CYSTOSCOPY WITH URETEROSCOPY, STONE BASKETRY AND STENT PLACEMENT Left 02/13/2016   Procedure: CYSTOSCOPY WITH LEFT URETEROSCOPY, HOLMIUM LASER AND STENT PLACEMENT;  Surgeon: Festus Aloe, MD;  Location: [facility];  Service: Urology;  Laterality: Left;  . CYSTOSCOPY/RETROGRADE/URETEROSCOPY/STONE EXTRACTION WITH BASKET Left 08/08/2015   Procedure: CYSTOSCOPY/RETROGRADE/URETEROSCOPY/STONE EXTRACTION WITH BASKET;  Surgeon: Festus Aloe, MD;  Location: [facility];  Service: Urology;  Laterality: Left;  . CYSTOSCOPY/URETEROSCOPY/HOLMIUM LASER/STENT PLACEMENT Left 07/30/2016   Procedure: CYSTOSCOPY/URETEROSCOPY/HOLMIUM LASER/STENT PLACEMENT;  Surgeon: Festus Aloe, MD;  Location: [facility];  Service: Urology;  Laterality: Left;  . CYSTOSCOPY/URETEROSCOPY/HOLMIUM LASER/STENT PLACEMENT Left 08/19/2017   Procedure: CYSTOSCOPY/URETEROSCOPY/HOLMIUM LASER/STENT PLACEMENT;  Surgeon: Festus Aloe, MD;  Location: [facility];  Service: Urology;  Laterality: Left;  . CYSTOSCOPY/URETEROSCOPY/HOLMIUM LASER/STENT PLACEMENT N/A 10/13/2018   Procedure: CYSTOSCOPY/RETROGRADE RIGHT URETEROSCOPY/ AND RIGHTSTENT PLACEMENT;  Surgeon: Irine Seal, MD;  Location: [facility];  Service: Urology;  Laterality: N/A;  . EXTRACORPOREAL SHOCK WAVE LITHOTRIPSY Left 07-07-2015  &  12-26-2014  . 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 HOLMIUM LASER APPLICATION Left 1/60/1093   Procedure: LASER LITHO;  Surgeon: Festus Aloe, MD;  Location: [facility];  Service: Urology;  Laterality: Left;  . HOLMIUM LASER APPLICATION Left 2/35/5732   Procedure: HOLMIUM LASER  WITH LITHOTRIPSY ;  Surgeon: Festus Aloe, MD;  Location: [facility];  Service: Urology;  Laterality: Left;  . HOLMIUM LASER APPLICATION Left 2/0/2542   Procedure:  HOLMIUM LASER APPLICATION;  Surgeon: Irine Seal, MD;  Location: [facility];  Service: Urology;  Laterality: Left;  . HOLMIUM LASER APPLICATION Left 06/25/2375   Procedure: HOLMIUM LASER APPLICATION;  Surgeon: Irine Seal, MD;  Location: [facility];  Service: Urology;  Laterality: Left;  . LYMPH GLAND EXCISION  2003   neck--  benign  . SVT ABLATION N/A 05/22/2018   Procedure: SVT ABLATION;  Surgeon: Evans Lance, MD;  Location: [facility];  Service: Cardiovascular;  Laterality: N/A;    There were no vitals filed for this visit.  Subjective Assessment - 03/05/20 1436    Subjective  States he is jjust getting off of work  and 7/10 tightness along right scapular border - burning.    Limitations  Standing;Walking    How long can you sit comfortably?  no issues    How long can you stand comfortably?  5-6 hours    How long can you walk comfortably?  no issues    Patient Stated Goals  "help alleviate some of the back pain"    Currently in Pain?  Yes    Pain Score  7     Pain Location  Back    Pain Onset  More than a month ago         [facility] PT Assessment - 03/05/20 0001      Assessment   Medical Diagnosis  Back pain    Referring Provider (PT)  Baltazar Apo, MD  [facility] Adult PT Treatment/Exercise - 03/05/20 0001      Exercises   Exercises  Lumbar      Lumbar Exercises: Supine   Other Supine Lumbar Exercises  serratus punches supine 3x8, 5" holds B    Other Supine Lumbar Exercises  towel roll down spine- 5 minutes total with and without rocking back and forth       Lumbar Exercises: Quadruped   [place]/Old Horse  10 reps    Other Quadruped Lumbar Exercises  thread the needle x5 B - increased symptoms.       Manual Therapy   Manual Therapy  Joint mobilization;Soft tissue mobilization    Manual therapy comments  all manual interventions performed independently of each other    Joint Mobilization  right scapular mobilizations in inferior and retracted  position grade II    Soft tissue mobilization  STM and TPR to right periscapular muscles - tolerated moderately well.  and to upper trap - tolerated well              PT Education - 03/05/20 1548    Education Details  on dry needling, on current condition and on joint/articulation mobility    Person(s) Educated  Patient    Methods  Explanation    Comprehension  Verbalized understanding       PT Short Term Goals - 02/26/20 1656      PT SHORT TERM GOAL #1   Title  Pt will perform HEP daily to reduce palpable muscle restrictions and improve pain with functional mobility.    Time  2    Period  Weeks    Status  New    Target Date  03/11/20        PT Long Term Goals - 02/26/20 1657      PT LONG TERM GOAL #1   Title  Pt will report 3/10 pain at worst during work shift to indicate improvement in postural strength and improved QoL.    Time  4    Period  Weeks    Status  New    Target Date  03/25/20      PT LONG TERM GOAL #2   Title  Pt will have no active trigger points in R thoracic musculature to improve pain and mobility with functional activity.    Time  4    Period  Weeks    Status  New            Plan - 03/05/20 1537    Clinical Impression Statement  Patient tolerated manual work to cervical spine best with reports of reduction of symptoms with return to upright. Thoracic mobility work increased burning sensation, especially posterior rib movements and scapular protraction. Trapezius muscle contributing to current symptoms and symptoms reproduced with palpation of this muscle. Educated patient on benefits of dry needling as well as risks associated. Will continue to focus on cervical mobility and soft tissue mobilizations.    Personal Factors and Comorbidities  Comorbidity 2    Comorbidities  Scoliosis of lumbar spine, migraines    Examination-Activity Limitations  Lift;Stand    Examination-Participation Restrictions  Other   work   Stability/Clinical Decision  Making  Stable/Uncomplicated    Rehab Potential  Good    PT Frequency  2x / week    PT Duration  4 weeks    PT Treatment/Interventions  ADLs/Self Care Home Management;Aquatic Therapy;Biofeedback;Cryotherapy;Moist Heat;Gait training;Functional mobility training;Therapeutic activities;Therapeutic exercise;Balance training;Neuromuscular re-education;Patient/family education;Manual techniques;Passive range of motion;Dry needling;Splinting;Spinal Manipulations;Joint Manipulations  PT Next Visit Plan  focus on UT STM and cervical mobilization, APs of ribs    PT Home Exercise Plan  Eval: corner/doorway pec stretch, upper trap stretch, levator trap stretch, seated scap squeezes    Consulted and Agree with Plan of Care  Patient       Patient will benefit from skilled therapeutic intervention in order to improve the following deficits and impairments:  Decreased activity tolerance, Decreased strength, Hypomobility, Increased fascial restricitons, Increased muscle spasms, Improper body mechanics, Postural dysfunction, Pain  Visit Diagnosis: Pain in thoracic spine  Other symptoms and signs involving the musculoskeletal system     Problem List Patient Active Problem List   Diagnosis Date Noted  . Right ureteral stone 10/13/2018  . Acute pyelonephritis 08/14/2018  . Bilateral hydronephrosis 08/14/2018  . Sepsis (HCC) 08/14/2018  . SVT (supraventricular tachycardia) (HCC) 05/22/2018  . Attention deficit hyperactivity disorder (ADHD), combined type 04/08/2018  . Chronic migraine 02/14/2017  . Neck pain 02/04/2016  . Hydronephrosis, left 08/19/2015  . Hydronephrosis determined by ultrasound   . Kidney stone on left side 08/18/2015  . Nephrolithiasis 08/18/2015  . Kidney stone 07/06/2015  . Left ureteral stone 07/06/2015  . Analgesic rebound headache 06/05/2014  . Migraine headache without aura 05/30/2014   3:49 PM, 03/05/20 Tereasa Coop, DPT Physical Therapy with [facility]  [phone] office  [facility] [address] [place], [place], [address] Phone: [phone]   Fax:  [phone]  Name: Thomas Howell MRN: [id number] Date of Birth: 24-May-1991

## 2020-03-05 NOTE — Patient Instructions (Signed)

## 2020-03-10 ENCOUNTER — Ambulatory Visit (HOSPITAL_COMMUNITY): Payer: 59 | Admitting: Physical Therapy

## 2020-03-12 ENCOUNTER — Ambulatory Visit (HOSPITAL_COMMUNITY): Payer: 59 | Admitting: Physical Therapy

## 2020-03-13 ENCOUNTER — Other Ambulatory Visit: Payer: Self-pay | Admitting: *Deleted

## 2020-03-13 MED ORDER — LEVETIRACETAM 750 MG PO TABS: 750.0000 mg | ORAL_TABLET | ORAL | 3 refills | Status: DC

## 2020-03-14 ENCOUNTER — Other Ambulatory Visit: Payer: Self-pay | Admitting: Neurology

## 2020-03-24 ENCOUNTER — Ambulatory Visit (HOSPITAL_COMMUNITY): Payer: 59 | Admitting: Physical Therapy

## 2020-03-24 ENCOUNTER — Telehealth (HOSPITAL_COMMUNITY): Payer: Self-pay | Admitting: Physical Therapy

## 2020-03-24 NOTE — Telephone Encounter (Signed)
pt called to cx due to he has to work

## 2020-03-26 ENCOUNTER — Ambulatory Visit (HOSPITAL_COMMUNITY): Payer: 59 | Admitting: Physical Therapy

## 2020-03-26 ENCOUNTER — Telehealth (HOSPITAL_COMMUNITY): Payer: Self-pay | Admitting: Physical Therapy

## 2020-03-26 NOTE — Telephone Encounter (Signed)
pt cancelled appt for today because he has a stomcah virus

## 2020-03-29 ENCOUNTER — Emergency Department (HOSPITAL_COMMUNITY)
Admission: EM | Admit: 2020-03-29 | Discharge: 2020-03-29 | Disposition: A | Discharge status: Home / Self Care | Payer: 59 | Attending: Emergency Medicine | Admitting: Emergency Medicine

## 2020-03-29 ENCOUNTER — Encounter (HOSPITAL_COMMUNITY): Payer: Self-pay | Admitting: Emergency Medicine

## 2020-03-29 ENCOUNTER — Emergency Department (HOSPITAL_COMMUNITY): Payer: 59

## 2020-03-29 DIAGNOSIS — R109 Unspecified abdominal pain: Secondary | ICD-10-CM | POA: Diagnosis present

## 2020-03-29 DIAGNOSIS — Z87891 Personal history of nicotine dependence: Secondary | ICD-10-CM | POA: Diagnosis not present

## 2020-03-29 DIAGNOSIS — N200 Calculus of kidney: Secondary | ICD-10-CM | POA: Diagnosis not present

## 2020-03-29 DIAGNOSIS — Z79899 Other long term (current) drug therapy: Secondary | ICD-10-CM | POA: Diagnosis not present

## 2020-03-29 LAB — CBC WITH DIFFERENTIAL/PLATELET
Abs Immature Granulocytes: 0.03 10*3/uL (ref 0.00–0.07)
Basophils Absolute: 0 10*3/uL (ref 0.0–0.1)
Basophils Relative: 1 %
Eosinophils Absolute: 0 10*3/uL (ref 0.0–0.5)
Eosinophils Relative: 0 %
HCT: 45.5 % (ref 39.0–52.0)
Hemoglobin: 15.8 g/dL (ref 13.0–17.0)
Immature Granulocytes: 0 %
Lymphocytes Relative: 17 %
Lymphs Abs: 1.4 10*3/uL (ref 0.7–4.0)
MCH: 31.2 pg (ref 26.0–34.0)
MCHC: 34.7 g/dL (ref 30.0–36.0)
MCV: 89.9 fL (ref 80.0–100.0)
Monocytes Absolute: 0.5 10*3/uL (ref 0.1–1.0)
Monocytes Relative: 7 %
Neutro Abs: 6 10*3/uL (ref 1.7–7.7)
Neutrophils Relative %: 75 %
Platelets: 176 10*3/uL (ref 150–400)
RBC: 5.06 MIL/uL (ref 4.22–5.81)
RDW: 11.5 % (ref 11.5–15.5)
WBC: 7.9 10*3/uL (ref 4.0–10.5)
nRBC: 0 % (ref 0.0–0.2)

## 2020-03-29 LAB — BASIC METABOLIC PANEL
Anion gap: 13 (ref 5–15)
BUN: 10 mg/dL (ref 6–20)
CO2: 23 mmol/L (ref 22–32)
Calcium: 9.7 mg/dL (ref 8.9–10.3)
Chloride: 103 mmol/L (ref 98–111)
Creatinine, Ser: 1.13 mg/dL (ref 0.61–1.24)
GFR calc Af Amer: >60 mL/min (ref >60)
GFR calc non Af Amer: >60 mL/min (ref >60)
Glucose, Bld: 103 mg/dL — ABNORMAL HIGH (ref 70–99)
Potassium: 3.6 mmol/L (ref 3.5–5.1)
Sodium: 139 mmol/L (ref 135–145)

## 2020-03-29 LAB — URINALYSIS, ROUTINE W REFLEX MICROSCOPIC
Bacteria, UA: NONE SEEN
Bilirubin Urine: NEGATIVE
Glucose, UA: NEGATIVE mg/dL
Ketones, ur: NEGATIVE mg/dL
Leukocytes,Ua: NEGATIVE
Nitrite: NEGATIVE
Protein, ur: 30 mg/dL — AB
Specific Gravity, Urine: 1.019 (ref 1.005–1.030)
pH: 6 (ref 5.0–8.0)

## 2020-03-29 MED ORDER — ONDANSETRON HCL 4 MG/2ML IJ SOLN: 4.0000 mg | Freq: Once | INTRAMUSCULAR | Status: DC

## 2020-03-29 MED ORDER — ONDANSETRON 4 MG PO TBDP
4.0000 mg | ORAL_TABLET | Freq: Once | ORAL | Status: AC
  Administered 2020-03-29: 4 mg via ORAL
  Filled 2020-03-29: qty 1

## 2020-03-29 MED ORDER — ONDANSETRON HCL 4 MG/2ML IJ SOLN
4.0000 mg | Freq: Once | INTRAMUSCULAR | Status: AC
  Administered 2020-03-29: 15:00:00 4 mg via INTRAVENOUS
  Filled 2020-03-29: qty 2

## 2020-03-29 MED ORDER — OXYCODONE-ACETAMINOPHEN 5-325 MG PO TABS: 1.0000 | ORAL_TABLET | Freq: Four times a day (QID) | ORAL | 0 refills | Status: AC | PRN

## 2020-03-29 MED ORDER — SODIUM CHLORIDE 0.9 % IV BOLUS (SEPSIS)
1000.0000 mL | Freq: Once | INTRAVENOUS | Status: AC
  Administered 2020-03-29: 1000 mL via INTRAVENOUS

## 2020-03-29 MED ORDER — HYDROMORPHONE HCL 1 MG/ML IJ SOLN
1.0000 mg | Freq: Once | INTRAMUSCULAR | Status: AC
  Administered 2020-03-29: 1 mg via INTRAVENOUS
  Filled 2020-03-29: qty 1

## 2020-03-29 MED ORDER — ONDANSETRON HCL 4 MG PO TABS: 4.0000 mg | ORAL_TABLET | Freq: Four times a day (QID) | ORAL | 0 refills | Status: DC

## 2020-03-29 MED ORDER — KETOROLAC TROMETHAMINE 15 MG/ML IJ SOLN
15.0000 mg | Freq: Once | INTRAMUSCULAR | Status: AC
  Administered 2020-03-29: 15 mg via INTRAVENOUS
  Filled 2020-03-29: qty 1

## 2020-03-29 NOTE — ED Triage Notes (Signed)
Pt. Stated, I started having left flank pain since this morning. I have a history of kidney stones.

## 2020-03-29 NOTE — Discharge Instructions (Signed)
Please follow up with urology in the next 1-2 days. Thank you for allowing me to care for you today. Please return to the emergency department if you have new or worsening symptoms. Take your medications as instructed and drink lots of fluids.

## 2020-03-29 NOTE — ED Provider Notes (Signed)
[facility] EMERGENCY DEPARTMENT Provider Note   CSN: [id number] Arrival date & time: 03/29/20  1351     History Chief Complaint  Patient presents with  . Abdominal Pain  . Emesis  . Nausea    JESE COMELLA is a 29 y.o. male.  Patient is a 29 year old male with past medical history of frequent kidney stones presenting to the emergency department for acute onset of left-sided flank pain radiating to his abdomen with nausea and vomiting.  Reports that this started about midnight and feels similar to kidney stones in the past.  Reports multiple procedures to remove kidney stones in the past.  Last one was January.  Denies any fever, dysuria, hematuria        Past Medical History:  Diagnosis Date  . Complication of anesthesia   . Frequency-urgency syndrome   . History of kidney stones   . Hx of nausea and vomiting    d/t kidney stone  . Hx of sepsis 08/11/2017   due to kidney stone/ hydronephrosis  . Left ureteral stone   . Migraine   . Pain due to ureteral stent ([place])   . PONV (postoperative nausea and vomiting)    none recently  . Renal calculi    bilateral per ct 07-26-2016  . Scoliosis of lumbar spine 1994   treated at [facility] until age 51  . Wears glasses     Patient Active Problem List   Diagnosis Date Noted  . Right ureteral stone 10/13/2018  . Acute pyelonephritis 08/14/2018  . Bilateral hydronephrosis 08/14/2018  . Sepsis ([place]) 08/14/2018  . SVT (supraventricular tachycardia) ([place]) 05/22/2018  . Attention deficit hyperactivity disorder (ADHD), combined type 04/08/2018  . Chronic migraine 02/14/2017  . Neck pain 02/04/2016  . Hydronephrosis, left 08/19/2015  . Hydronephrosis determined by ultrasound   . Kidney stone on left side 08/18/2015  . Nephrolithiasis 08/18/2015  . Kidney stone 07/06/2015  . Left ureteral stone 07/06/2015  . Analgesic rebound headache 06/05/2014  . Migraine headache without aura 05/30/2014    Past  Surgical History:  Procedure Laterality Date  . CYSTOSCOPY W/ RETROGRADES Right 07/06/2015   Procedure: CYSTOSCOPY WITH RETROGRADE PYELOGRAM;  Surgeon: Festus Aloe, MD;  Location: [facility];  Service: Urology;  Laterality: Right;  . CYSTOSCOPY W/ URETERAL STENT PLACEMENT Left 08/08/2015   Procedure: CYSTOSCOPY WITH STENT REPLACEMENT;  Surgeon: Festus Aloe, MD;  Location[facility];  Service: Urology;  Laterality: Left;  . CYSTOSCOPY W/ URETERAL STENT PLACEMENT Left 02/21/2017   Procedure: CYSTOSCOPY WITH RETROGRADE PYELOGRAM/URETERAL LEFT STENT PLACEMENT WITH  LASER;  Surgeon: Irine Seal, MD;  Location: [facility];  Service: Urology;  Laterality: Left;  . CYSTOSCOPY W/ URETERAL STENT PLACEMENT Left 08/10/2017   Procedure: CYSTOSCOPY WITH RETROGRADE PYELOGRAM/URETERAL STENT PLACEMENT;  Surgeon: Raynelle Bring, MD;  Location: [facility];  Service: Urology;  Laterality: Left;  . CYSTOSCOPY WITH RETROGRADE PYELOGRAM, URETEROSCOPY AND STENT PLACEMENT Left 06/24/2014   Procedure: CYSTOSCOPY WITH RETROGRADE PYELOGRAM,  AND STENT PLACEMENT;  Surgeon: Sharyn Creamer, MD;  Location: [facility];  Service: Urology;  Laterality: Left;  . CYSTOSCOPY WITH RETROGRADE PYELOGRAM, URETEROSCOPY AND STENT PLACEMENT Left 07/05/2014   Procedure: CYSTO/LEFT URETEROSCOPY/LEFT RETROGRADE PYELOGRAM/LEFT STENT PLACEMENT;  Surgeon: Festus Aloe, MD;  Location: [facility];  Service: Urology;  Laterality: Left;  . CYSTOSCOPY WITH RETROGRADE PYELOGRAM, URETEROSCOPY AND STENT PLACEMENT Left 07/06/2015   Procedure: CYSTOSCOPY WITH RETROGRADE PYELOGRAM, URETEROSCOPY , LASER, STENT PLACEMENT and BASKET EXTRACTION;  Surgeon: Rodman Key  Mena Goes, MD;  Location: [facility];  Service: Urology;  Laterality: Left;  . CYSTOSCOPY WITH RETROGRADE PYELOGRAM, URETEROSCOPY AND STENT PLACEMENT Left 08/19/2015   Procedure: CYSTOSCOPY WITH RETROGRADE PYELOGRAM, URETEROSCOPY AND STENT PLACEMENT;  Surgeon: Jerilee Field, MD;   Location: [facility];  Service: Urology;  Laterality: Left;  . CYSTOSCOPY WITH RETROGRADE PYELOGRAM, URETEROSCOPY AND STENT PLACEMENT Left 03/13/2018   Procedure: CYSTOSCOPY WITH RETROGRADE PYELOGRAM, URETEROSCOPY AND LEFT STENT PLACEMENT;  Surgeon: Bjorn Pippin, MD;  Location: [facility];  Service: Urology;  Laterality: Left;  . CYSTOSCOPY WITH URETEROSCOPY AND STENT PLACEMENT Left 01/16/2016   Procedure: CYSTOSCOPY WITH LEFT RETROGRADE PYELOGRAM  LEFT DIGITAL URETEROSCOPY AND PLACEMENT LEFT URETERAL STENT;  Surgeon: Jerilee Field, MD;  Location: [facility];  Service: Urology;  Laterality: Left;  . CYSTOSCOPY WITH URETEROSCOPY, STONE BASKETRY AND STENT PLACEMENT Left 02/13/2016   Procedure: CYSTOSCOPY WITH LEFT URETEROSCOPY, HOLMIUM LASER AND STENT PLACEMENT;  Surgeon: Jerilee Field, MD;  Location: [facility];  Service: Urology;  Laterality: Left;  . CYSTOSCOPY/RETROGRADE/URETEROSCOPY/STONE EXTRACTION WITH BASKET Left 08/08/2015   Procedure: CYSTOSCOPY/RETROGRADE/URETEROSCOPY/STONE EXTRACTION WITH BASKET;  Surgeon: Jerilee Field, MD;  Location[facility];  Service: Urology;  Laterality: Left;  . CYSTOSCOPY/URETEROSCOPY/HOLMIUM LASER/STENT PLACEMENT Left 07/30/2016   Procedure: CYSTOSCOPY/URETEROSCOPY/HOLMIUM LASER/STENT PLACEMENT;  Surgeon: Jerilee Field, MD;  Location: [facility];  Service: Urology;  Laterality: Left;  . CYSTOSCOPY/URETEROSCOPY/HOLMIUM LASER/STENT PLACEMENT Left 08/19/2017   Procedure: CYSTOSCOPY/URETEROSCOPY/HOLMIUM LASER/STENT PLACEMENT;  Surgeon: Jerilee Field, MD;  Location: [facility];  Service: Urology;  Laterality: Left;  . CYSTOSCOPY/URETEROSCOPY/HOLMIUM LASER/STENT PLACEMENT N/A 10/13/2018   Procedure: CYSTOSCOPY/RETROGRADE RIGHT URETEROSCOPY/ AND RIGHTSTENT PLACEMENT;  Surgeon: Bjorn Pippin, MD;  Location: [facility];  Service: Urology;  Laterality: N/A;  . EXTRACORPOREAL SHOCK WAVE LITHOTRIPSY Left 07-07-2015  &  12-26-2014  . FOOT  CAPSULE RELEASE W/ PERCUTANEOUS HEEL CORD LENGTHENING, TIBIAL TENDON TRANSFER Left 1994   clubfoot  . HOLMIUM LASER APPLICATION Left 07/05/2014   Procedure: LASER LITHO;  Surgeon: Jerilee Field, MD;  Location: [facility];  Service: Urology;  Laterality: Left;  . HOLMIUM LASER APPLICATION Left 08/08/2015   Procedure: HOLMIUM LASER  WITH LITHOTRIPSY ;  Surgeon: Jerilee Field, MD;  Location: [facility];  Service: Urology;  Laterality: Left;  . HOLMIUM LASER APPLICATION Left 02/21/2017   Procedure: HOLMIUM LASER APPLICATION;  Surgeon: Bjorn Pippin, MD;  Location: [facility];  Service: Urology;  Laterality: Left;  . HOLMIUM LASER APPLICATION Left 03/13/2018   Procedure: HOLMIUM LASER APPLICATION;  Surgeon: Bjorn Pippin, MD;  Location: [facility];  Service: Urology;  Laterality: Left;  . LYMPH GLAND EXCISION  2003   neck--  benign  . SVT ABLATION N/A 05/22/2018   Procedure: SVT ABLATION;  Surgeon: Marinus Maw, MD;  [facility];  Service: Cardiovascular;  Laterality: N/A;       Family History  Problem Relation Age of Onset  . Cancer Father   . Kidney Stones Mother   . Cancer Other   . Hypertension Other   . Hyperlipidemia Other   . Stroke Other   . Kidney Stones Brother     Social History   Tobacco Use  . Smoking status: Former Smoker    Years: 2.00    Quit date: 07/29/2014    Years since quitting: 5.6  . Smokeless tobacco: Never Used  . Tobacco comment: social smoker  Substance Use Topics  . Alcohol use: Yes    Alcohol/week: 0.0 standard drinks    Comment:  occ  . Drug use: No    Home Medications Prior to Admission medications   Medication Sig Start Date End Date Taking? Authorizing Provider  acetaminophen (TYLENOL) 500 MG tablet Take 1,000 mg by mouth every 6 (six) hours as needed for moderate pain.    [provider]  amphetamine-dextroamphetamine (ADDERALL XR) 20 MG 24 hr capsule Take 1 capsule (20 mg total) by mouth every  morning. 03/04/20   Merlyn Albert, MD  butalbital-acetaminophen-caffeine (FIORICET) [phone] MG tablet TAKE 1 TABLET BY MOUTH EVERY 6 HOURS AS NEEDED FOR HEADACHE 09/26/19   Sater, Pearletha Furl, MD  Erenumab-aooe (AIMOVIG) 140 MG/ML SOAJ Inject 140 mg into the skin every 28 (twenty-eight) days. 02/11/20   Lomax, Amy, NP  gabapentin (NEURONTIN) 300 MG capsule Take 1 capsule (300 mg total) by mouth 2 (two) times daily. 02/28/20   Lomax, Amy, NP  hydrochlorothiazide (HYDRODIURIL) 25 MG tablet Take 25 mg by mouth daily.     [provider]  lamoTRIgine (LAMICTAL) 100 MG tablet TAKE 1 TABLET(100 MG) BY MOUTH TWICE DAILY 10/29/19   Lomax, Amy, NP  levETIRAcetam (KEPPRA) 750 MG tablet Take 1-2 tablets (750-1,500 mg total) by mouth See admin instructions. TAKE 1 TABLET BY MOUTH EVERY MORNING AND 2 TABLETS EVERY NIGHT AT BEDTIME 03/13/20   Lomax, Amy, NP  ondansetron (ZOFRAN) 4 MG tablet Take 1 tablet (4 mg total) by mouth every 6 (six) hours. 03/29/20   Arlyn Dunning, PA-C  oxyCODONE-acetaminophen (PERCOCET/ROXICET) 5-325 MG tablet Take 1 tablet by mouth every 6 (six) hours as needed for up to 3 days for severe pain. 03/29/20 04/01/20  Arlyn Dunning, PA-C  propranolol (INDERAL) 20 MG tablet Take 1 tablet (20 mg total) by mouth 2 (two) times daily. 11/05/19   Lomax, Amy, NP  rizatriptan (MAXALT-MLT) 10 MG disintegrating tablet TAKE 1 TABLET BY MOUTH AS NEEDED MAY REPEAT IN 2 HOURS IF NEEDED Patient taking differently: Take 10 mg by mouth as needed for migraine. TAKE 1 TABLET BY MOUTH AS NEEDED MAY REPEAT IN 2 HOURS IF NEEDED 07/10/19   Lomax, Amy, NP  tiZANidine (ZANAFLEX) 4 MG tablet TAKE 1 TABLET(4 MG) BY MOUTH EVERY 6 HOURS AS NEEDED FOR BACK PAIN 02/26/20   Merlyn Albert, MD    Allergies    Bactrim [sulfamethoxazole-trimethoprim], Codeine, and Adhesive [tape]  Review of Systems   Review of Systems  Constitutional: Negative for fever.  Respiratory: Negative for cough and shortness of breath.     Gastrointestinal: Positive for abdominal pain, nausea and vomiting.  Genitourinary: Positive for flank pain. Negative for dysuria.    Physical Exam Updated Vital Signs BP (!) 141/107 (BP Location: Left Arm)   Pulse 78   Temp 97.9 F (36.6 C) (Oral)   Resp 16   Ht 5\' 8"  (1.727 m)   Wt 104.3 kg   SpO2 97%   BMI 34.97 kg/m   Physical Exam Vitals and nursing note reviewed.  Constitutional:      Appearance: Normal appearance. He is well-developed.     Comments: Appears uncomfortable and in pain, actively vomiting  HENT:     Head: Normocephalic.     Mouth/Throat:     Mouth: Mucous membranes are moist.  Eyes:     General: No scleral icterus.    Conjunctiva/sclera: Conjunctivae normal.  Pulmonary:     Effort: Pulmonary effort is normal.  Abdominal:     Palpations: Abdomen is soft.     Tenderness: There is left CVA tenderness.  Skin:    General: Skin is warm and dry.     Capillary Refill: Capillary refill takes less than 2 seconds.  Neurological:     Mental Status: He is alert.  Psychiatric:        Mood and Affect: Mood normal.     ED Results / Procedures / Treatments   Labs (all labs ordered are listed, but only abnormal results are displayed) Labs Reviewed  URINALYSIS, ROUTINE W REFLEX MICROSCOPIC - Abnormal; Notable for the following components:      Result Value   APPearance HAZY (*)    Hgb urine dipstick MODERATE (*)    Protein, ur 30 (*)    All other components within normal limits  BASIC METABOLIC PANEL - Abnormal; Notable for the following components:   Glucose, Bld 103 (*)    All other components within normal limits  CBC WITH DIFFERENTIAL/PLATELET    EKG None  Radiology CT Renal Stone Study  Result Date: 03/29/2020 CLINICAL DATA:  Left flank pain EXAM: CT ABDOMEN AND PELVIS WITHOUT CONTRAST TECHNIQUE: Multidetector CT imaging of the abdomen and pelvis was performed following the standard protocol without IV contrast. COMPARISON:  02/27/2019  FINDINGS: Lower chest: No acute abnormality. Hepatobiliary: Diffusely decreased attenuation of the hepatic parenchyma compatible with hepatic steatosis. No focal hepatic lesion is identified. Gallbladder is unremarkable. No biliary dilatation. Pancreas: Unremarkable. No pancreatic ductal dilatation or surrounding inflammatory changes. Spleen: Normal in size without focal abnormality. Adrenals/Urinary Tract: Unremarkable adrenal glands. Numerous bilateral punctate renal calculi, increased in size and number from prior exam including a 8 mm calculus within the lower pole of the left kidney and a 6 mm calculus in the superior pole of the right kidney. 3 mm calculus within the mid left ureter (series 3, image 51) resulting in moderate left hydronephrosis. No right-sided hydronephrosis. Right ureter is unremarkable. Urinary bladder is decompressed, limiting its evaluation. 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 No evidence of bowel obstruction or inflammatory process. Normal appendix within the low pelvis. Vascular/Lymphatic: No significant vascular findings are present. No enlarged abdominal or pelvic lymph nodes. Reproductive: Prostate is unremarkable. Other: No abdominal wall hernia or abnormality. No abdominopelvic ascites. 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 No acute osseous findings. IMPRESSION: 1. Obstructing 3 mm calculus within the mid left ureter resulting in moderate left hydronephrosis. 2. Numerous bilateral punctate renal calculi, increased in size and number from prior exam. 3. Hepatic steatosis. Electronically Signed   By: Duanne Guess D.O.   On: 03/29/2020 16:35    Procedures Procedures (including critical care time)  Medications Ordered in ED Medications  HYDROmorphone (DILAUDID) injection 1 mg (has no administration in time range)  ondansetron (ZOFRAN-ODT)  disintegrating tablet 4 mg (4 mg Oral Given 03/29/20 1432)  sodium chloride 0.9 % bolus 1,000 mL (1,000 mLs Intravenous New Bag/Given 03/29/20 1531)  ketorolac (TORADOL) 15 MG/ML injection 15 mg (15 mg Intravenous Given 03/29/20 1519)  ondansetron (ZOFRAN) injection 4 mg (4 mg Intravenous Given 03/29/20 1519)  HYDROmorphone (DILAUDID) injection 1 mg (1 mg Intravenous Given 03/29/20 1529)    ED Course  I have reviewed the triage vital signs and the nursing notes.  Pertinent labs & imaging results that were available during my care of the patient were reviewed by me and considered in my medical decision making (see chart for details).  Clinical Course as of Mar 30 1707  Sat Mar 29, 2020  1652  Patient with history of kidney stones presenting to the emergency department for acute onset of left flank pain radiating to his abdomen.  Found on work-up to have a 3 mm obstructing stone on the left.  Urinalysis with hemoglobin and protein but no signs of infection.  Normal basic metabolic panel and complete blood count.  Patient was given Dilaudid, Toradol, fluids and Zofran.  He regularly sees urology and will follow up with them tomorrow/Monday.    [KM]    Clinical Course User Index [KM] Jeral PinchMcLean, Jensine Luz A, PA-C   MDM Rules/Calculators/A&P                      Based on review of vitals, medical screening exam, lab work and/or imaging, there does not appear to be an acute, emergent etiology for the patient's symptoms. Counseled pt on good return precautions and encouraged both PCP and ED follow-up as needed.  Prior to discharge, I also discussed incidental imaging findings with patient in detail and advised appropriate, recommended follow-up in detail.  Clinical Impression: 1. Kidney stone     Disposition: Discharge  Prior to providing a prescription for a controlled substance, I independently reviewed the patient's recent prescription history on the [place] Cont[place] Substance Reporting  System. The patient had no recent or regular prescriptions and was deemed appropriate for a brief, less than 3 day prescription of narcotic for acute analgesia.  This note was prepared with assistance of Conservation officer, historic buildingsDragon voice recognition software. Occasional wrong-word or sound-a-like substitutions may have occurred due to the inherent limitations of voice recognition software.  Final Clinical Impression(s) / ED Diagnoses Final diagnoses:  Kidney stone    Rx / DC Orders ED Discharge Orders         Ordered    oxyCODONE-acetaminophen (PERCOCET/ROXICET) 5-325 MG tablet  Every 6 hours PRN     03/29/20 1708    ondansetron (ZOFRAN) 4 MG tablet  Every 6 hours     03/29/20 1708           Jeral PinchMcLean, Amariana Mirando A, PA-C 03/29/20 1708    Lorre NickAllen, Anthony, MD 03/30/20 1718

## 2020-03-31 ENCOUNTER — Other Ambulatory Visit: Payer: Self-pay

## 2020-03-31 ENCOUNTER — Encounter (HOSPITAL_COMMUNITY): Payer: Self-pay | Admitting: Physical Therapy

## 2020-03-31 ENCOUNTER — Ambulatory Visit (HOSPITAL_COMMUNITY): Payer: 59 | Attending: Family Medicine | Admitting: Physical Therapy

## 2020-03-31 DIAGNOSIS — M546 Pain in thoracic spine: Secondary | ICD-10-CM | POA: Diagnosis not present

## 2020-03-31 DIAGNOSIS — R29898 Other symptoms and signs involving the musculoskeletal system: Secondary | ICD-10-CM | POA: Insufficient documentation

## 2020-03-31 NOTE — Therapy (Signed)
[place], [place], [address] Phone: [phone]   Fax:  [phone]  Physical Therapy Treatment And Progress Note  Patient Details  Name: Thomas Howell MRN: [id number] Date of Birth: [birth year] Referring Provider (PT): Baltazar Apo, MD  Progress Note Reporting Period 02/26/20 to 03/31/20  See note below for Objective Data and Assessment of Progress/Goals.       Encounter Date: 03/31/2020  PT End of Session - 03/31/20 1533    Visit Number  3    Number of Visits  11    Date for PT Re-Evaluation  04/28/20    Authorization Type  02/26/20 to 03/25/20, VL 23    Authorization Time Period  02/26/20 to 03/25/20. NEW POC DATES 03/31/20 to 04/28/2020    Authorization - Visit Number  3    Authorization - Number of Visits  23    Progress Note Due on Visit  11    PT Start Time  6606    PT Stop Time  1611    PT Time Calculation (min)  40 min    Activity Tolerance  Patient tolerated treatment well    Behavior During Therapy  WFL for tasks assessed/performed       Past Medical History:  Diagnosis Date  . Complication of anesthesia   . Frequency-urgency syndrome   . History of kidney stones   . Hx of nausea and vomiting    d/t kidney stone  . Hx of sepsis 08/11/2017   due to kidney stone/ hydronephrosis  . Left ureteral stone   . Migraine   . Pain due to ureteral stent ([place])   . PONV (postoperative nausea and vomiting)    none recently  . Renal calculi    bilateral per ct 07-26-2016  . Scoliosis of lumbar spine 1994   treated at [facility] until age 85  . Wears glasses     Past Surgical History:  Procedure Laterality Date  . CYSTOSCOPY W/ RETROGRADES Right 07/06/2015   Procedure: CYSTOSCOPY WITH RETROGRADE PYELOGRAM;  Surgeon: Festus Aloe, MD;  Location: [facility];  Service: Urology;  Laterality: Right;  . CYSTOSCOPY W/ URETERAL STENT PLACEMENT Left 08/08/2015   Procedure: CYSTOSCOPY WITH STENT REPLACEMENT;  Surgeon:  Festus Aloe, MD;  Location[facility];  Service: Urology;  Laterality: Left;  . CYSTOSCOPY W/ URETERAL STENT PLACEMENT Left 02/21/2017   Procedure: CYSTOSCOPY WITH RETROGRADE PYELOGRAM/URETERAL LEFT STENT PLACEMENT WITH  LASER;  Surgeon: Irine Seal, MD;  Location: [facility];  Service: Urology;  Laterality: Left;  . CYSTOSCOPY W/ URETERAL STENT PLACEMENT Left 08/10/2017   Procedure: CYSTOSCOPY WITH RETROGRADE PYELOGRAM/URETERAL STENT PLACEMENT;  Surgeon: Raynelle Bring, MD;  Location: [facility];  Service: Urology;  Laterality: Left;  . CYSTOSCOPY WITH RETROGRADE PYELOGRAM, URETEROSCOPY AND STENT PLACEMENT Left 06/24/2014   Procedure: CYSTOSCOPY WITH RETROGRADE PYELOGRAM,  AND STENT PLACEMENT;  Surgeon: Sharyn Creamer, MD;  Location: [facility];  Service: Urology;  Laterality: Left;  . CYSTOSCOPY WITH RETROGRADE PYELOGRAM, URETEROSCOPY AND STENT PLACEMENT Left 07/05/2014   Procedure: CYSTO/LEFT URETEROSCOPY/LEFT RETROGRADE PYELOGRAM/LEFT STENT PLACEMENT;  Surgeon: Festus Aloe, MD;  Location[facility];  Service: Urology;  Laterality: Left;  . CYSTOSCOPY WITH RETROGRADE PYELOGRAM, URETEROSCOPY AND STENT PLACEMENT Left 07/06/2015   Procedure: CYSTOSCOPY WITH RETROGRADE PYELOGRAM, URETEROSCOPY , LASER, STENT PLACEMENT and BASKET EXTRACTION;  Surgeon: Festus Aloe, MD;  Location: [facility];  Service: Urology;  Laterality: Left;  . CYSTOSCOPY WITH RETROGRADE PYELOGRAM, URETEROSCOPY AND STENT  PLACEMENT Left 08/19/2015   Procedure: CYSTOSCOPY WITH RETROGRADE PYELOGRAM, URETEROSCOPY AND STENT PLACEMENT;  Surgeon: Festus Aloe, MD;  Location: [facility];  Service: Urology;  Laterality: Left;  . CYSTOSCOPY WITH RETROGRADE PYELOGRAM, URETEROSCOPY AND STENT PLACEMENT Left 03/13/2018   Procedure: CYSTOSCOPY WITH RETROGRADE PYELOGRAM, URETEROSCOPY AND LEFT STENT PLACEMENT;  Surgeon: Irine Seal, MD;  Location: [facility];  Service: Urology;  Laterality: Left;  . CYSTOSCOPY WITH URETEROSCOPY  AND STENT PLACEMENT Left 01/16/2016   Procedure: CYSTOSCOPY WITH LEFT RETROGRADE PYELOGRAM  LEFT DIGITAL URETEROSCOPY AND PLACEMENT LEFT URETERAL STENT;  Surgeon: Festus Aloe, MD;  Location: [facility];  Service: Urology;  Laterality: Left;  . CYSTOSCOPY WITH URETEROSCOPY, STONE BASKETRY AND STENT PLACEMENT Left 02/13/2016   Procedure: CYSTOSCOPY WITH LEFT URETEROSCOPY, HOLMIUM LASER AND STENT PLACEMENT;  Surgeon: Festus Aloe, MD;  Location: [facility];  Service: Urology;  Laterality: Left;  . CYSTOSCOPY/RETROGRADE/URETEROSCOPY/STONE EXTRACTION WITH BASKET Left 08/08/2015   Procedure: CYSTOSCOPY/RETROGRADE/URETEROSCOPY/STONE EXTRACTION WITH BASKET;  Surgeon: Festus Aloe, MD;  Location: [facility];  Service: Urology;  Laterality: Left;  . CYSTOSCOPY/URETEROSCOPY/HOLMIUM LASER/STENT PLACEMENT Left 07/30/2016   Procedure: CYSTOSCOPY/URETEROSCOPY/HOLMIUM LASER/STENT PLACEMENT;  Surgeon: Festus Aloe, MD;  Location: [facility];  Service: Urology;  Laterality: Left;  . CYSTOSCOPY/URETEROSCOPY/HOLMIUM LASER/STENT PLACEMENT Left 08/19/2017   Procedure: CYSTOSCOPY/URETEROSCOPY/HOLMIUM LASER/STENT PLACEMENT;  Surgeon: Festus Aloe, MD;  Location: [facility];  Service: Urology;  Laterality: Left;  . CYSTOSCOPY/URETEROSCOPY/HOLMIUM LASER/STENT PLACEMENT N/A 10/13/2018   Procedure: CYSTOSCOPY/RETROGRADE RIGHT URETEROSCOPY/ AND RIGHTSTENT PLACEMENT;  Surgeon: Irine Seal, MD;  Location: [facility];  Service: Urology;  Laterality: N/A;  . EXTRACORPOREAL SHOCK WAVE LITHOTRIPSY Left 07-07-2015  &  12-26-2014  . 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 HOLMIUM LASER APPLICATION Left 3/97/6734   Procedure: LASER LITHO;  Surgeon: Festus Aloe, MD;  Location: [facility];  Service: Urology;  Laterality: Left;  . HOLMIUM LASER APPLICATION Left 1/93/7902   Procedure: HOLMIUM LASER   WITH LITHOTRIPSY ;  Surgeon: Festus Aloe, MD;  Location: [facility];  Service: Urology;  Laterality: Left;  . HOLMIUM LASER APPLICATION Left 4/0/9735   Procedure: HOLMIUM LASER APPLICATION;  Surgeon: Irine Seal, MD;  Location: [facility];  Service: Urology;  Laterality: Left;  . HOLMIUM LASER APPLICATION Left 03/17/9241   Procedure: HOLMIUM LASER APPLICATION;  Surgeon: Irine Seal, MD;  Location: [facility];  Service: Urology;  Laterality: Left;  . LYMPH GLAND EXCISION  2003   neck--  benign  . SVT ABLATION N/A 05/22/2018   Procedure: SVT ABLATION;  Surgeon: Evans Lance, MD;  Location: [facility];  Service: Cardiovascular;  Laterality: N/A;    There were no vitals filed for this visit.      [facility] PT Assessment - 03/31/20 0001      Assessment   Medical Diagnosis  Back pain    Referring Provider (PT)  Baltazar Apo, MD      Observation/Other Assessments   Focus on Therapeutic Outcomes (FOTO)   45% limited      Posture/Postural Control   Posture/Postural Control  Postural limitations    Postural Limitations  Rounded Shoulders    Posture Comments  history of lumbar scoliosis      AROM   Overall AROM   Within functional limits for tasks performed    Overall AROM Comments  BUE and spine AROM pain-free and [facility]      Strength   Overall Strength  Within functional limits for tasks performed      Palpation   Palpation comment  R rhomboids, thoracic erector spinae, traps with palpable restrictions noted, recreated pt's same pain with manual STM                   [facility] Adult PT Treatment/Exercise - 03/31/20 0001      Lumbar Exercises: Standing   Other Standing Lumbar Exercises  slef mobilization with lacross ball to periscapular muscles at wall       Lumbar Exercises: Prone   Other Prone Lumbar Exercises  retraction and depression 4x5 2" holds B       Manual Therapy   Manual Therapy  Joint mobilization;Soft tissue mobilization    Manual therapy  comments  all manual interventions performed independently of each other    Joint Mobilization  medial glides to right C3-5 tolerated well grade II/III    Soft tissue mobilization  STM and TPR to right periscapular muscles - tolerated moderately well.  and to right cervical paraspinals - tolerated well              PT Education - 03/31/20 1718    Education Details  in current condition, DN, areas to needle and HEP    Person(s) Educated  Patient    Methods  Explanation    Comprehension  Verbalized understanding       PT Short Term Goals - 03/31/20 1537      PT SHORT TERM GOAL #1   Title  Pt will perform HEP daily to reduce palpable muscle restrictions and improve pain with functional mobility.    Time  2    Period  Weeks    Status  On-going    Target Date  03/11/20        PT Long Term Goals - 03/31/20 1704      PT LONG TERM GOAL #1   Title  Pt will report 3/10 pain at worst during work shift to indicate improvement in postural strength and improved QoL.    Time  4    Period  Weeks    Status  On-going      PT LONG TERM GOAL #2   Title  Pt will have no active trigger points in R thoracic musculature to improve pain and mobility with functional activity.    Time  4    Period  Weeks    Status  On-going            Plan - 03/31/20 1722    Clinical Impression Statement  Patient present for a progress note on this date. He has not been back to therapy since his first week secondary to work increasing his hours and not being able to make an appointment. Schedule is more manageable and is able to attend therapy more regularly. He has been doing his exercises and they seem to be helping. Patient has not met any goals at this time but this is expected as he has not preformed consistent therapy. Extending original POC now that patient is able to regularly attend appointments.    Personal Factors and Comorbidities  Comorbidity 2    Comorbidities  Scoliosis of lumbar spine,  migraines    Examination-Activity Limitations  Lift;Stand    Examination-Participation Restrictions  Other   work   Stability/Clinical Decision Making  Stable/Uncomplicated    Rehab Potential  Good    PT Frequency  2x / week    PT Duration  4 weeks  PT Treatment/Interventions  ADLs/Self Care Home Management;Aquatic Therapy;Biofeedback;Cryotherapy;Moist Heat;Gait training;Functional mobility training;Therapeutic activities;Therapeutic exercise;Balance training;Neuromuscular re-education;Patient/family education;Manual techniques;Passive range of motion;Dry needling;Splinting;Spinal Manipulations;Joint Manipulations    PT Next Visit Plan  focus on UT STM and cervical mobilization, APs of ribs    PT Home Exercise Plan  Eval: corner/doorway pec stretch, upper trap stretch, levator trap stretch, seated scap squeezes    Consulted and Agree with Plan of Care  Patient       Patient will benefit from skilled therapeutic intervention in order to improve the following deficits and impairments:  Decreased activity tolerance, Decreased strength, Hypomobility, Increased fascial restricitons, Increased muscle spasms, Improper body mechanics, Postural dysfunction, Pain  Visit Diagnosis: Pain in thoracic spine  Other symptoms and signs involving the musculoskeletal system     Problem List Patient Active Problem List   Diagnosis Date Noted  . Right ureteral stone 10/13/2018  . Acute pyelonephritis 08/14/2018  . Bilateral hydronephrosis 08/14/2018  . Sepsis ([place]) 08/14/2018  . SVT (supraventricular tachycardia) ([place]) 05/22/2018  . Attention deficit hyperactivity disorder (ADHD), combined type 04/08/2018  . Chronic migraine 02/14/2017  . Neck pain 02/04/2016  . Hydronephrosis, left 08/19/2015  . Hydronephrosis determined by ultrasound   . Kidney stone on left side 08/18/2015  . Nephrolithiasis 08/18/2015  . Kidney stone 07/06/2015  . Left ureteral stone 07/06/2015  . Analgesic rebound  headache 06/05/2014  . Migraine headache without aura 05/30/2014    5:23 PM, 03/31/20 Jerene Pitch, DPT Physical Therapy with [facility]  [phone] office  [place] [address] [place], [place], [address] Phone: [phone]   Fax:  [phone]  Name: THEOTIS GERDEMAN MRN: [id number] Date of Birth: August 28, 1991

## 2020-04-02 ENCOUNTER — Encounter (HOSPITAL_COMMUNITY): Payer: Self-pay | Admitting: Physical Therapy

## 2020-04-02 ENCOUNTER — Other Ambulatory Visit: Payer: Self-pay

## 2020-04-02 ENCOUNTER — Ambulatory Visit (HOSPITAL_COMMUNITY): Payer: 59 | Admitting: Physical Therapy

## 2020-04-02 DIAGNOSIS — R29898 Other symptoms and signs involving the musculoskeletal system: Secondary | ICD-10-CM

## 2020-04-02 DIAGNOSIS — M546 Pain in thoracic spine: Secondary | ICD-10-CM

## 2020-04-02 NOTE — Therapy (Addendum)
[facility] [address] [place], [place], [address] Phone: [phone]   Fax:  [phone]  Physical Therapy Treatment and Discharge Note  Patient Details  Name: Thomas Howell MRN: [id number] Date of Birth: Jul 24, 1991 Referring Provider (PT): Baltazar Apo, MD   PHYSICAL THERAPY DISCHARGE SUMMARY  Visits from Start of Care: 4  Current functional level related to goals / functional outcomes: Unable to assess due to unplanned discharge   Remaining deficits: Unable to assess due to unplanned discharge   Education / Equipment: Unable to assess due to unplanned discharge Plan: Patient agrees to discharge.  Patient goals were not met. Patient is being discharged due to not returning since the last visit.  ?????         4:30 PM, 10/07/20 Jerene Pitch, DPT Physical Therapy with [facility]  [phone] office     Encounter Date: 04/02/2020  PT End of Session - 04/02/20 1614    Visit Number  4    Number of Visits  11    Date for PT Re-Evaluation  04/28/20    Authorization Type  02/26/20 to 03/25/20, VL 23    Authorization Time Period  02/26/20 to 03/25/20. NEW POC DATES 03/31/20 to 04/28/2020    Authorization - Visit Number  4    Authorization - Number of Visits  23    Progress Note Due on Visit  11    PT Start Time  0223    PT Stop Time  1655    PT Time Calculation (min)  40 min    Activity Tolerance  Patient tolerated treatment well    Behavior During Therapy  WFL for tasks assessed/performed       Past Medical History:  Diagnosis Date  . Complication of anesthesia   . Frequency-urgency syndrome   . History of kidney stones   . Hx of nausea and vomiting    d/t kidney stone  . Hx of sepsis 08/11/2017   due to kidney stone/ hydronephrosis  . Left ureteral stone   . Migraine   . Pain due to ureteral stent ([place])   . PONV (postoperative nausea and vomiting)    none recently  . Renal calculi     bilateral per ct 07-26-2016  . Scoliosis of lumbar spine 1994   treated at [facility] until age 60  . Wears glasses     Past Surgical History:  Procedure Laterality Date  . CYSTOSCOPY W/ RETROGRADES Right 07/06/2015   Procedure: CYSTOSCOPY WITH RETROGRADE PYELOGRAM;  Surgeon: Festus Aloe, MD;  Location: [facility];  Service: Urology;  Laterality: Right;  . CYSTOSCOPY W/ URETERAL STENT PLACEMENT Left 08/08/2015   Procedure: CYSTOSCOPY WITH STENT REPLACEMENT;  Surgeon: Festus Aloe, MD;  Location: [facility];  Service: Urology;  Laterality: Left;  . CYSTOSCOPY W/ URETERAL STENT PLACEMENT Left 02/21/2017   Procedure: CYSTOSCOPY WITH RETROGRADE PYELOGRAM/URETERAL LEFT STENT PLACEMENT WITH  LASER;  Surgeon: Irine Seal, MD;  Location: [facility];  Service: Urology;  Laterality: Left;  . CYSTOSCOPY W/ URETERAL STENT PLACEMENT Left 08/10/2017   Procedure: CYSTOSCOPY WITH RETROGRADE PYELOGRAM/URETERAL STENT PLACEMENT;  Surgeon: Raynelle Bring, MD;  Location: [facility];  Service: Urology;  Laterality: Left;  . CYSTOSCOPY WITH RETROGRADE PYELOGRAM, URETEROSCOPY AND STENT PLACEMENT Left 06/24/2014   Procedure: CYSTOSCOPY WITH RETROGRADE PYELOGRAM,  AND STENT PLACEMENT;  Surgeon: Sharyn Creamer, MD;  Location: [facility];  Service: Urology;  Laterality: Left;  . CYSTOSCOPY WITH RETROGRADE PYELOGRAM, URETEROSCOPY AND  STENT PLACEMENT Left 07/05/2014   Procedure: CYSTO/LEFT URETEROSCOPY/LEFT RETROGRADE PYELOGRAM/LEFT STENT PLACEMENT;  Surgeon: Festus Aloe, MD;  Location: [facility];  Service: Urology;  Laterality: Left;  . CYSTOSCOPY WITH RETROGRADE PYELOGRAM, URETEROSCOPY AND STENT PLACEMENT Left 07/06/2015   Procedure: CYSTOSCOPY WITH RETROGRADE PYELOGRAM, URETEROSCOPY , LASER, STENT PLACEMENT and BASKET EXTRACTION;  Surgeon: Festus Aloe, MD;  Location: [facility];  Service: Urology;  Laterality: Left;  . CYSTOSCOPY WITH RETROGRADE PYELOGRAM, URETEROSCOPY AND STENT PLACEMENT Left  08/19/2015   Procedure: CYSTOSCOPY WITH RETROGRADE PYELOGRAM, URETEROSCOPY AND STENT PLACEMENT;  Surgeon: Festus Aloe, MD;  Location: [facility];  Service: Urology;  Laterality: Left;  . CYSTOSCOPY WITH RETROGRADE PYELOGRAM, URETEROSCOPY AND STENT PLACEMENT Left 03/13/2018   Procedure: CYSTOSCOPY WITH RETROGRADE PYELOGRAM, URETEROSCOPY AND LEFT STENT PLACEMENT;  Surgeon: Irine Seal, MD;  Location: [facility];  Service: Urology;  Laterality: Left;  . CYSTOSCOPY WITH URETEROSCOPY AND STENT PLACEMENT Left 01/16/2016   Procedure: CYSTOSCOPY WITH LEFT RETROGRADE PYELOGRAM  LEFT DIGITAL URETEROSCOPY AND PLACEMENT LEFT URETERAL STENT;  Surgeon: Festus Aloe, MD;  Location: [facility];  Service: Urology;  Laterality: Left;  . CYSTOSCOPY WITH URETEROSCOPY, STONE BASKETRY AND STENT PLACEMENT Left 02/13/2016   Procedure: CYSTOSCOPY WITH LEFT URETEROSCOPY, HOLMIUM LASER AND STENT PLACEMENT;  Surgeon: Festus Aloe, MD;  Location: [facility];  Service: Urology;  Laterality: Left;  . CYSTOSCOPY/RETROGRADE/URETEROSCOPY/STONE EXTRACTION WITH BASKET Left 08/08/2015   Procedure: CYSTOSCOPY/RETROGRADE/URETEROSCOPY/STONE EXTRACTION WITH BASKET;  Surgeon: Festus Aloe, MD;  Location: [facility];  Service: Urology;  Laterality: Left;  . CYSTOSCOPY/URETEROSCOPY/HOLMIUM LASER/STENT PLACEMENT Left 07/30/2016   Procedure: CYSTOSCOPY/URETEROSCOPY/HOLMIUM LASER/STENT PLACEMENT;  Surgeon: Festus Aloe, MD;  Location: [facility];  Service: Urology;  Laterality: Left;  . CYSTOSCOPY/URETEROSCOPY/HOLMIUM LASER/STENT PLACEMENT Left 08/19/2017   Procedure: CYSTOSCOPY/URETEROSCOPY/HOLMIUM LASER/STENT PLACEMENT;  Surgeon: Festus Aloe, MD;  Location: [facility];  Service: Urology;  Laterality: Left;  . CYSTOSCOPY/URETEROSCOPY/HOLMIUM LASER/STENT PLACEMENT N/A 10/13/2018   Procedure: CYSTOSCOPY/RETROGRADE RIGHT URETEROSCOPY/ AND RIGHTSTENT PLACEMENT;  Surgeon: Irine Seal, MD;  Location:  [facility];  Service: Urology;  Laterality: N/A;  . EXTRACORPOREAL SHOCK WAVE LITHOTRIPSY Left 07-07-2015  &  12-26-2014  . 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 HOLMIUM LASER APPLICATION Left 1/75/1025   Procedure: LASER LITHO;  Surgeon: Festus Aloe, MD;  Location: [facility];  Service: Urology;  Laterality: Left;  . HOLMIUM LASER APPLICATION Left 8/52/7782   Procedure: HOLMIUM LASER  WITH LITHOTRIPSY ;  Surgeon: Festus Aloe, MD;  Location: [facility];  Service: Urology;  Laterality: Left;  . HOLMIUM LASER APPLICATION Left 03/22/3535   Procedure: HOLMIUM LASER APPLICATION;  Surgeon: Irine Seal, MD;  Location: [facility];  Service: Urology;  Laterality: Left;  . HOLMIUM LASER APPLICATION Left 1/44/3154   Procedure: HOLMIUM LASER APPLICATION;  Surgeon: Irine Seal, MD;  Location: [facility];  Service: Urology;  Laterality: Left;  . LYMPH GLAND EXCISION  2003   neck--  benign  . SVT ABLATION N/A 05/22/2018   Procedure: SVT ABLATION;  Surgeon: Evans Lance, MD;  Location: [facility];  Service: Cardiovascular;  Laterality: N/A;    There were no vitals filed for this visit.  Subjective Assessment - 04/02/20 1706    Subjective  States mid back pain today is better at like 1/10 pain but is having a migraine that started around 1pm. States his migraine pain is 5/10    Limitations  Standing;Walking  How long can you sit comfortably?  no issues    How long can you stand comfortably?  5-6 hours    How long can you walk comfortably?  no issues    Patient Stated Goals  "help alleviate some of the back pain"    Currently in Pain?  Yes    Pain Score  1     Pain Location  Back    Pain Orientation  Mid    Pain Descriptors / Indicators  Aching    Pain Onset  More than a month ago         [facility] PT Assessment - 04/02/20 0001      Assessment   Medical Diagnosis  Back pain    Referring  Provider (PT)  Baltazar Apo, MD                   [facility] Adult PT Treatment/Exercise - 04/02/20 0001      Lumbar Exercises: Standing   Row  Theraband;Strengthening   30 reps, blue theraband    Theraband Level (Row)  Level 4 (Blue)    Shoulder Extension  Strengthening;Theraband   30 reps, low row   Theraband Level (Shoulder Extension)  Level 4 (Blue)    Shoulder Extension Limitations  focus on keeping shoulders down    Other Standing Lumbar Exercises  shoulder extension with stick for stretch 2x5, 10" holds B;     Other Standing Lumbar Exercises  self mobilization to left pec       Lumbar Exercises: Prone   Other Prone Lumbar Exercises  hands on able shoulder taps x10 B --> to on knees 2x15 B       Manual Therapy   Manual Therapy  Joint mobilization;Soft tissue mobilization    Manual therapy comments  all manual interventions performed independently of each other    Joint Mobilization  medial glides to left C3-5 tolerated well grade II/III    Soft tissue mobilization  STM to left pec minor, UT, cervical paraspinals and sub occipitals             PT Education - 04/02/20 1706    Education Details  on HEP and rationale for exercises    Person(s) Educated  Patient    Methods  Explanation    Comprehension  Verbalized understanding       PT Short Term Goals - 03/31/20 1537      PT SHORT TERM GOAL #1   Title  Pt will perform HEP daily to reduce palpable muscle restrictions and improve pain with functional mobility.    Time  2    Period  Weeks    Status  On-going    Target Date  03/11/20        PT Long Term Goals - 03/31/20 1704      PT LONG TERM GOAL #1   Title  Pt will report 3/10 pain at worst during work shift to indicate improvement in postural strength and improved QoL.    Time  4    Period  Weeks    Status  On-going      PT LONG TERM GOAL #2   Title  Pt will have no active trigger points in R thoracic musculature to improve pain and mobility  with functional activity.    Time  4    Period  Weeks    Status  On-going            Plan - 04/02/20 1704  Clinical Impression Statement  Patient reported migraine today that limited ability to participate in therapy. Able to decrease migraine symptoms from 5/10 to 3/10 with manual interventions to tolerate mid back exercises. Added mid back strengthening exercises with focus on keeping shoulders down (has tendency to elevate shoulders).    Personal Factors and Comorbidities  Comorbidity 2    Comorbidities  Scoliosis of lumbar spine, migraines    Examination-Activity Limitations  Lift;Stand    Examination-Participation Restrictions  Other   work   Stability/Clinical Decision Making  Stable/Uncomplicated    Rehab Potential  Good    PT Frequency  2x / week    PT Duration  4 weeks    PT Treatment/Interventions  ADLs/Self Care Home Management;Aquatic Therapy;Biofeedback;Cryotherapy;Moist Heat;Gait training;Functional mobility training;Therapeutic activities;Therapeutic exercise;Balance training;Neuromuscular re-education;Patient/family education;Manual techniques;Passive range of motion;Dry needling;Splinting;Spinal Manipulations;Joint Manipulations    PT Next Visit Plan  focus on UT STM and cervical mobilization, APs of ribs    PT Home Exercise Plan  Eval: corner/doorway pec stretch, upper trap stretch, levator trap stretch, seated scap squeezes; 4/14 shoulder ext with band and with dowel, rows    Consulted and Agree with Plan of Care  Patient       Patient will benefit from skilled therapeutic intervention in order to improve the following deficits and impairments:  Decreased activity tolerance, Decreased strength, Hypomobility, Increased fascial restricitons, Increased muscle spasms, Improper body mechanics, Postural dysfunction, Pain  Visit Diagnosis: Pain in thoracic spine  Other symptoms and signs involving the musculoskeletal system     Problem List Patient Active  Problem List   Diagnosis Date Noted  . Right ureteral stone 10/13/2018  . Acute pyelonephritis 08/14/2018  . Bilateral hydronephrosis 08/14/2018  . Sepsis ([place]) 08/14/2018  . SVT (supraventricular tachycardia) ([place]) 05/22/2018  . Attention deficit hyperactivity disorder (ADHD), combined type 04/08/2018  . Chronic migraine 02/14/2017  . Neck pain 02/04/2016  . Hydronephrosis, left 08/19/2015  . Hydronephrosis determined by ultrasound   . Kidney stone on left side 08/18/2015  . Nephrolithiasis 08/18/2015  . Kidney stone 07/06/2015  . Left ureteral stone 07/06/2015  . Analgesic rebound headache 06/05/2014  . Migraine headache without aura 05/30/2014    5:07 PM, 04/02/20 Jerene Pitch, DPT Physical Therapy with [facility]  [phone] office  [facility] [address] [place], [place], [address] Phone: [phone]   Fax:  [phone]  Name: Thomas Howell MRN: [id number] Date of Birth: [birth year]

## 2020-04-08 ENCOUNTER — Telehealth (HOSPITAL_COMMUNITY): Payer: Self-pay | Admitting: Physical Therapy

## 2020-04-08 ENCOUNTER — Ambulatory Visit (HOSPITAL_COMMUNITY): Payer: 59 | Admitting: Physical Therapy

## 2020-04-08 NOTE — Telephone Encounter (Signed)
pt called to cancel due to he has to work late

## 2020-04-10 ENCOUNTER — Ambulatory Visit (HOSPITAL_COMMUNITY): Payer: 59 | Admitting: Physical Therapy

## 2020-04-13 ENCOUNTER — Other Ambulatory Visit: Payer: Self-pay | Admitting: Family Medicine

## 2020-04-14 MED ORDER — AMPHETAMINE-DEXTROAMPHET ER 20 MG PO CP24: 20.0000 mg | ORAL_CAPSULE | ORAL | 0 refills | Status: DC

## 2020-04-15 ENCOUNTER — Telehealth (HOSPITAL_COMMUNITY): Payer: Self-pay | Admitting: Physical Therapy

## 2020-04-15 ENCOUNTER — Ambulatory Visit (HOSPITAL_COMMUNITY): Payer: 59 | Admitting: Physical Therapy

## 2020-04-15 NOTE — Telephone Encounter (Signed)
pt called to cx this appt due to he has to work 

## 2020-04-17 ENCOUNTER — Telehealth (HOSPITAL_COMMUNITY): Payer: Self-pay | Admitting: Physical Therapy

## 2020-04-17 ENCOUNTER — Ambulatory Visit (HOSPITAL_COMMUNITY): Payer: 59 | Admitting: Physical Therapy

## 2020-04-17 NOTE — Telephone Encounter (Signed)
pt called to cx today's appt due to he had death for one of his friends and had to attend the wake today

## 2020-04-22 ENCOUNTER — Ambulatory Visit (HOSPITAL_COMMUNITY): Payer: 59 | Admitting: Physical Therapy

## 2020-04-22 ENCOUNTER — Telehealth (HOSPITAL_COMMUNITY): Payer: Self-pay | Admitting: Physical Therapy

## 2020-04-22 NOTE — Telephone Encounter (Signed)
pt called to cx today's visit due to a death in the family.

## 2020-04-24 ENCOUNTER — Telehealth (HOSPITAL_COMMUNITY): Payer: Self-pay | Admitting: Physical Therapy

## 2020-04-24 ENCOUNTER — Ambulatory Visit (HOSPITAL_COMMUNITY): Payer: 59 | Attending: Family Medicine | Admitting: Physical Therapy

## 2020-04-24 NOTE — Telephone Encounter (Signed)
No Show #1 Called and left message about missed appointment and reminded patient of upcoming appointment.  4:36 PM, 04/24/20 Tereasa Coop, DPT Physical Therapy with [facility]  [phone] office

## 2020-04-29 ENCOUNTER — Ambulatory Visit (HOSPITAL_COMMUNITY): Payer: 59 | Admitting: Physical Therapy

## 2020-05-01 ENCOUNTER — Ambulatory Visit (HOSPITAL_COMMUNITY): Payer: 59 | Admitting: Physical Therapy

## 2020-05-15 ENCOUNTER — Other Ambulatory Visit: Payer: Self-pay | Admitting: Neurology

## 2020-06-02 ENCOUNTER — Other Ambulatory Visit: Payer: Self-pay

## 2020-06-02 ENCOUNTER — Other Ambulatory Visit: Payer: Self-pay | Admitting: Family Medicine

## 2020-06-02 MED ORDER — AMPHETAMINE-DEXTROAMPHET ER 20 MG PO CP24: 20.0000 mg | ORAL_CAPSULE | ORAL | 0 refills | Status: DC

## 2020-06-02 NOTE — Telephone Encounter (Signed)
Waiting on appointment

## 2020-06-02 NOTE — Telephone Encounter (Signed)
Scheduled 6/17 

## 2020-06-02 NOTE — Telephone Encounter (Signed)
Gave small course till appt. Dr. Karie Schwalbe

## 2020-06-05 ENCOUNTER — Ambulatory Visit (INDEPENDENT_AMBULATORY_CARE_PROVIDER_SITE_OTHER): Payer: 59 | Admitting: Family Medicine

## 2020-06-05 DIAGNOSIS — Z79899 Other long term (current) drug therapy: Secondary | ICD-10-CM

## 2020-06-12 ENCOUNTER — Ambulatory Visit: Payer: Self-pay | Admitting: Adult Health

## 2020-06-13 ENCOUNTER — Encounter: Payer: Self-pay | Admitting: Family Medicine

## 2020-07-07 ENCOUNTER — Other Ambulatory Visit: Payer: Self-pay | Admitting: *Deleted

## 2020-07-07 MED ORDER — PROPRANOLOL HCL 20 MG PO TABS: 20.0000 mg | ORAL_TABLET | Freq: Two times a day (BID) | ORAL | 6 refills | Status: DC

## 2020-07-10 ENCOUNTER — Other Ambulatory Visit: Payer: Self-pay | Admitting: *Deleted

## 2020-07-10 MED ORDER — RIZATRIPTAN BENZOATE 10 MG PO TBDP: ORAL_TABLET | ORAL | 5 refills | Status: DC

## 2020-07-24 ENCOUNTER — Other Ambulatory Visit: Payer: Self-pay | Admitting: *Deleted

## 2020-07-24 MED ORDER — AIMOVIG 140 MG/ML ~~LOC~~ SOAJ: 140.0000 mg | SUBCUTANEOUS | 5 refills | Status: DC

## 2020-08-18 ENCOUNTER — Encounter: Payer: Self-pay | Admitting: Family Medicine

## 2020-08-19 NOTE — Telephone Encounter (Addendum)
Initiated CMM Key: [id number] for aimovig 140mg /ml AJ.  Determination pending. [employer].  Received 08-20-20 denial.  "  The request for coverage for Aimovig Inj 140mg /Ml, use as directed (1 per month), is denied. This decision is based on health plan criteria for Aimovig. This medicine is covered only if: All of the following: (1) One of the following: (a) You have a diagnosis of episodic migraines with both of the following: (i) Less than 15 headache days per month. (ii) Greater than or equal to 4 migraine days per month. (b) OR You have a diagnosis of chronic migraines with both of the following: (i) Greater than or equal to 15 headache days per month. (ii) At least 8 migraine days per month. (2) You have a trial and failure of at least two months, contraindication, or intolerance to two of the following prophylactic therapies (document name and date tried): (i) Amitriptyline (Elavil). (ii) One of the following beta-blockers: Atenolol, metoprolol, nadolol, propranolol, or timolol. (iii) Divalproex sodium (Depakote/Depakote ER). (iv) Topiramate (Topamax). (v) Venlafaxine (Effexor/Effexor XR). (vi) OnabotulinumtoxinA (Botox) [Note: Coverage of onabotulinumtoxinA (Botox) may be subject to additional benefit and coverage review requirements] The information provided does not show that you meet the criteria listed above".  I resubmitted on CMM again KEY [id number] as pt has been on this medication (episodic migraines has tried amitriptyline, tried 01-23-2016 and then topamax 05-02-2015).  Determination pending.

## 2020-09-02 NOTE — Telephone Encounter (Addendum)
Plan member ID: [id number] Case number: [id number] Prescriber name: Shawnie Dapper Prescriber fax: [phone] NOTICE OF APPROVAL Dear Thomas Howell, [employer], on behalf of [employer], is responsible for reviewing pharmacy services provided to [employer] members. [employer] received a request on 08/28/2020 from your prescriber for coverage of Aimovig Inj 140mg /Ml. Your request for Aimovig Inj 140mg /Ml has been approved. How long does this approval last? Aimovig Inj 140mg /Ml, use as directed (1 per month), is approved through 02/24/2021 or until coverage for the medication is no longer available under your benefit plan or the medication becomes subject to a pharmacy benefit coverage requirement, such as supply limits or notification, whichever occurs first as allowed by law. Please note: Doses/quantities above plan limits and/or maximum Food and Drug Administration (FDA) approved dosing may be subject to further review. Reviewed by: , R.Ph. Fax confirmation received [facility] [phone], and relayed approval via mychart to pt.

## 2020-09-27 ENCOUNTER — Encounter (HOSPITAL_COMMUNITY): Payer: Self-pay

## 2020-09-27 ENCOUNTER — Emergency Department (HOSPITAL_COMMUNITY)
Admission: EM | Admit: 2020-09-27 | Discharge: 2020-09-27 | Disposition: A | Discharge status: Home / Self Care | Payer: No Typology Code available for payment source | Attending: Emergency Medicine | Admitting: Emergency Medicine

## 2020-09-27 ENCOUNTER — Emergency Department (HOSPITAL_COMMUNITY): Payer: No Typology Code available for payment source

## 2020-09-27 ENCOUNTER — Other Ambulatory Visit: Payer: Self-pay

## 2020-09-27 DIAGNOSIS — Z87891 Personal history of nicotine dependence: Secondary | ICD-10-CM | POA: Insufficient documentation

## 2020-09-27 DIAGNOSIS — N201 Calculus of ureter: Secondary | ICD-10-CM

## 2020-09-27 DIAGNOSIS — Z79899 Other long term (current) drug therapy: Secondary | ICD-10-CM | POA: Insufficient documentation

## 2020-09-27 DIAGNOSIS — N132 Hydronephrosis with renal and ureteral calculous obstruction: Secondary | ICD-10-CM | POA: Insufficient documentation

## 2020-09-27 DIAGNOSIS — Z960 Presence of urogenital implants: Secondary | ICD-10-CM | POA: Insufficient documentation

## 2020-09-27 DIAGNOSIS — R109 Unspecified abdominal pain: Secondary | ICD-10-CM | POA: Diagnosis present

## 2020-09-27 LAB — CBC WITH DIFFERENTIAL/PLATELET
Abs Immature Granulocytes: 0.02 10*3/uL (ref 0.00–0.07)
Basophils Absolute: 0 10*3/uL (ref 0.0–0.1)
Basophils Relative: 0 %
Eosinophils Absolute: 0 10*3/uL (ref 0.0–0.5)
Eosinophils Relative: 0 %
HCT: 47.5 % (ref 39.0–52.0)
Hemoglobin: 16.5 g/dL (ref 13.0–17.0)
Immature Granulocytes: 0 %
Lymphocytes Relative: 9 %
Lymphs Abs: 0.9 10*3/uL (ref 0.7–4.0)
MCH: 30.6 pg (ref 26.0–34.0)
MCHC: 34.7 g/dL (ref 30.0–36.0)
MCV: 88.1 fL (ref 80.0–100.0)
Monocytes Absolute: 0.5 10*3/uL (ref 0.1–1.0)
Monocytes Relative: 5 %
Neutro Abs: 8.4 10*3/uL — ABNORMAL HIGH (ref 1.7–7.7)
Neutrophils Relative %: 86 %
Platelets: 184 10*3/uL (ref 150–400)
RBC: 5.39 MIL/uL (ref 4.22–5.81)
RDW: 11.9 % (ref 11.5–15.5)
WBC: 9.8 10*3/uL (ref 4.0–10.5)
nRBC: 0 % (ref 0.0–0.2)

## 2020-09-27 LAB — BASIC METABOLIC PANEL
Anion gap: 10 (ref 5–15)
BUN: 19 mg/dL (ref 6–20)
CO2: 25 mmol/L (ref 22–32)
Calcium: 9.7 mg/dL (ref 8.9–10.3)
Chloride: 101 mmol/L (ref 98–111)
Creatinine, Ser: 1.23 mg/dL (ref 0.61–1.24)
GFR, Estimated: >60 mL/min (ref >60)
Glucose, Bld: 126 mg/dL — ABNORMAL HIGH (ref 70–99)
Potassium: 3.7 mmol/L (ref 3.5–5.1)
Sodium: 136 mmol/L (ref 135–145)

## 2020-09-27 MED ORDER — ONDANSETRON 4 MG PO TBDP: 4.0000 mg | ORAL_TABLET | Freq: Three times a day (TID) | ORAL | 0 refills | Status: DC | PRN

## 2020-09-27 MED ORDER — KETOROLAC TROMETHAMINE 30 MG/ML IJ SOLN
30.0000 mg | Freq: Once | INTRAMUSCULAR | Status: AC
  Administered 2020-09-27: 30 mg via INTRAVENOUS
  Filled 2020-09-27: qty 1

## 2020-09-27 MED ORDER — ONDANSETRON HCL 4 MG/2ML IJ SOLN
4.0000 mg | Freq: Once | INTRAMUSCULAR | Status: AC
  Administered 2020-09-27: 4 mg via INTRAVENOUS
  Filled 2020-09-27: qty 2

## 2020-09-27 MED ORDER — HYDROCODONE-ACETAMINOPHEN 5-325 MG PO TABS: 1.0000 | ORAL_TABLET | ORAL | 0 refills | Status: DC | PRN

## 2020-09-27 MED ORDER — HYDROMORPHONE HCL 1 MG/ML IJ SOLN
1.0000 mg | Freq: Once | INTRAMUSCULAR | Status: AC
  Administered 2020-09-27: 1 mg via INTRAVENOUS
  Filled 2020-09-27: qty 1

## 2020-09-27 MED ORDER — SODIUM CHLORIDE 0.9 % IV BOLUS
1000.0000 mL | Freq: Once | INTRAVENOUS | Status: AC
  Administered 2020-09-27: 1000 mL via INTRAVENOUS

## 2020-09-27 NOTE — Discharge Instructions (Signed)
Take Norco as needed as prescribed for pain. Take Zofran as needed as prescribed for nausea and vomiting. Return to ER for worsening or concerning symptoms. Follow up with your urologist on Monday as needed.

## 2020-09-27 NOTE — ED Provider Notes (Signed)
[facility]-EMERGENCY DEPT Provider Note   CSN: [id number] Arrival date & time: 09/27/20  0815     History No chief complaint on file.   Thomas Howell is a 29 y.o. male.  29 year old male with history of multiple kidney stones, lithotripsy and stents, managed by Dr. Mena Goes, presents with left flank pain onset 8PM last night. Patient is taking Flomax and Phenergan at home without relief of pain or vomiting. Denies fevers, chills. No other complaints or concerns.         Past Medical History:  Diagnosis Date  . Complication of anesthesia   . Frequency-urgency syndrome   . History of kidney stones   . Hx of nausea and vomiting    d/t kidney stone  . Hx of sepsis 08/11/2017   due to kidney stone/ hydronephrosis  . Left ureteral stone   . Migraine   . Pain due to ureteral stent (HCC)   . PONV (postoperative nausea and vomiting)    none recently  . Renal calculi    bilateral per ct 07-26-2016  . Scoliosis of lumbar spine 1994   treated at [facility] until age 77  . Wears glasses     Patient Active Problem List   Diagnosis Date Noted  . Right ureteral stone 10/13/2018  . Acute pyelonephritis 08/14/2018  . Bilateral hydronephrosis 08/14/2018  . Sepsis (HCC) 08/14/2018  . SVT (supraventricular tachycardia) (HCC) 05/22/2018  . Attention deficit hyperactivity disorder (ADHD), combined type 04/08/2018  . Chronic migraine 02/14/2017  . Neck pain 02/04/2016  . Hydronephrosis, left 08/19/2015  . Hydronephrosis determined by ultrasound   . Kidney stone on left side 08/18/2015  . Nephrolithiasis 08/18/2015  . Kidney stone 07/06/2015  . Left ureteral stone 07/06/2015  . Analgesic rebound headache 06/05/2014  . Migraine headache without aura 05/30/2014    Past Surgical History:  Procedure Laterality Date  . CYSTOSCOPY W/ RETROGRADES Right 07/06/2015   Procedure: CYSTOSCOPY WITH RETROGRADE PYELOGRAM;  Surgeon: Jerilee Field, MD;  Location: [facility];   Service: Urology;  Laterality: Right;  . CYSTOSCOPY W/ URETERAL STENT PLACEMENT Left 08/08/2015   Procedure: CYSTOSCOPY WITH STENT REPLACEMENT;  Surgeon: Jerilee Field, MD;  Location: [facility];  Service: Urology;  Laterality: Left;  . CYSTOSCOPY W/ URETERAL STENT PLACEMENT Left 02/21/2017   Procedure: CYSTOSCOPY WITH RETROGRADE PYELOGRAM/URETERAL LEFT STENT PLACEMENT WITH  LASER;  Surgeon: Bjorn Pippin, MD;  Location: [facility];  Service: Urology;  Laterality: Left;  . CYSTOSCOPY W/ URETERAL STENT PLACEMENT Left 08/10/2017   Procedure: CYSTOSCOPY WITH RETROGRADE PYELOGRAM/URETERAL STENT PLACEMENT;  Surgeon: Heloise Purpura, MD;  Location: [facility];  Service: Urology;  Laterality: Left;  . CYSTOSCOPY WITH RETROGRADE PYELOGRAM, URETEROSCOPY AND STENT PLACEMENT Left 06/24/2014   Procedure: CYSTOSCOPY WITH RETROGRADE PYELOGRAM,  AND STENT PLACEMENT;  Surgeon: Magdalene Molly, MD;  Location: [facility];  Service: Urology;  Laterality: Left;  . CYSTOSCOPY WITH RETROGRADE PYELOGRAM, URETEROSCOPY AND STENT PLACEMENT Left 07/05/2014   Procedure: CYSTO/LEFT URETEROSCOPY/LEFT RETROGRADE PYELOGRAM/LEFT STENT PLACEMENT;  Surgeon: Jerilee Field, MD;  Location[facility];  Service: Urology;  Laterality: Left;  . CYSTOSCOPY WITH RETROGRADE PYELOGRAM, URETEROSCOPY AND STENT PLACEMENT Left 07/06/2015   Procedure: CYSTOSCOPY WITH RETROGRADE PYELOGRAM, URETEROSCOPY , LASER, STENT PLACEMENT and BASKET EXTRACTION;  Surgeon: Jerilee Field, MD;  Location: [facility];  Service: Urology;  Laterality: Left;  . CYSTOSCOPY WITH RETROGRADE PYELOGRAM, URETEROSCOPY AND STENT PLACEMENT Left 08/19/2015   Procedure: CYSTOSCOPY WITH RETROGRADE PYELOGRAM, URETEROSCOPY AND STENT PLACEMENT;  Surgeon: Jerilee FieldMatthew Eskridge, MD;  Location: [facility];  Service: Urology;  Laterality: Left;  . CYSTOSCOPY WITH RETROGRADE PYELOGRAM, URETEROSCOPY AND STENT PLACEMENT Left 03/13/2018   Procedure: CYSTOSCOPY WITH RETROGRADE PYELOGRAM,  URETEROSCOPY AND LEFT STENT PLACEMENT;  Surgeon: Bjorn PippinWrenn, John, MD;  Location: [facility];  Service: Urology;  Laterality: Left;  . CYSTOSCOPY WITH URETEROSCOPY AND STENT PLACEMENT Left 01/16/2016   Procedure: CYSTOSCOPY WITH LEFT RETROGRADE PYELOGRAM  LEFT DIGITAL URETEROSCOPY AND PLACEMENT LEFT URETERAL STENT;  Surgeon: Jerilee FieldMatthew Eskridge, MD;  Location: [facility];  Service: Urology;  Laterality: Left;  . CYSTOSCOPY WITH URETEROSCOPY, STONE BASKETRY AND STENT PLACEMENT Left 02/13/2016   Procedure: CYSTOSCOPY WITH LEFT URETEROSCOPY, HOLMIUM LASER AND STENT PLACEMENT;  Surgeon: Jerilee FieldMatthew Eskridge, MD;  Location: [facility];  Service: Urology;  Laterality: Left;  . CYSTOSCOPY/RETROGRADE/URETEROSCOPY/STONE EXTRACTION WITH BASKET Left 08/08/2015   Procedure: CYSTOSCOPY/RETROGRADE/URETEROSCOPY/STONE EXTRACTION WITH BASKET;  Surgeon: Jerilee FieldMatthew Eskridge, MD;  [facility] [place];  Service: Urology;  Laterality: Left;  . CYSTOSCOPY/URETEROSCOPY/HOLMIUM LASER/STENT PLACEMENT Left 07/30/2016   Procedure: CYSTOSCOPY/URETEROSCOPY/HOLMIUM LASER/STENT PLACEMENT;  Surgeon: Jerilee FieldMatthew Eskridge, MD;  [facility] [place];  Service: Urology;  Laterality: Left;  . CYSTOSCOPY/URETEROSCOPY/HOLMIUM LASER/STENT PLACEMENT Left 08/19/2017   Procedure: CYSTOSCOPY/URETEROSCOPY/HOLMIUM LASER/STENT PLACEMENT;  Surgeon: [name], Matthew, MD;  [facility] [place];  Service: Urology;  Laterality: Left;  . CYSTOSCOPY/URETEROSCOPY/HOLMIUM LASER/STENT PLACEMENT N/A 10/13/2018   Procedure: CYSTOSCOPY/RETROGRADE RIGHT URETEROSCOPY/ AND RIGHTSTENT PLACEMENT;  Surgeon: Bjorn PippinWrenn, John, MD;  Location: [facility];  Service: Urology;  Laterality: N/A;  . EXTRACORPOREAL SHOCK WAVE LITHOTRIPSY Left 07-07-2015  &  12-26-2014  . 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 HOLMIUM LASER APPLICATION Left 07/05/2014   Procedure: LASER LITHO;  Surgeon: Jerilee FieldMatthew Eskridge,  MD;  [facility] [place];  Service: Urology;  Laterality: Left;  . HOLMIUM LASER APPLICATION Left 08/08/2015   Procedure: HOLMIUM LASER  WITH LITHOTRIPSY ;  Surgeon: Jerilee FieldMatthew Eskridge, MD;  [facility] [place];  Service: Urology;  Laterality: Left;  . HOLMIUM LASER APPLICATION Left 02/21/2017   Procedure: HOLMIUM LASER APPLICATION;  Surgeon: Bjorn PippinJohn Wrenn, MD;  Location: [facility];  Service: Urology;  Laterality: Left;  . HOLMIUM LASER APPLICATION Left 03/13/2018   Procedure: HOLMIUM LASER APPLICATION;  Surgeon: Bjorn PippinWrenn, John, MD;  Location: [facility];  Service: Urology;  Laterality: Left;  . LYMPH GLAND EXCISION  2003   neck--  benign  . SVT ABLATION N/A 05/22/2018   Procedure: SVT ABLATION;  Surgeon: Marinus Mawaylor, Gregg W, MD;  [facility];  Service: Cardiovascular;  Laterality: N/A;       Family History  Problem Relation Age of Onset  . Cancer Father   . Kidney Stones Mother   . Cancer Other   . Hypertension Other   . Hyperlipidemia Other   . Stroke Other   . Kidney Stones Brother     Social History   Tobacco Use  . Smoking status: Former Smoker    Years: 2.00    Quit date: 07/29/2014    Years since quitting: 6.1  . Smokeless tobacco: Never Used  . Tobacco comment: social smoker  Vaping Use  . Vaping Use: Never used  Substance Use Topics  . Alcohol use: Yes    Alcohol/week: 0.0 standard drinks    Comment: occ  . Drug use: No    Home Medications Prior to Admission medications   Medication Sig Start Date End Date Taking? Authorizing Provider  acetaminophen (TYLENOL) 500 MG tablet Take 1,000 mg by mouth every 6 (six) hours as needed for moderate pain.    [provider]  amphetamine-dextroamphetamine (ADDERALL XR) 20 MG 24 hr capsule Take 1 capsule (20 mg total) by mouth every morning. 06/02/20   Ladona Ridgel, Malena M, DO  butalbital-acetaminophen-caffeine (FIORICET) 50-325-40 MG tablet TAKE 1 TABLET BY MOUTH EVERY 6 HOURS AS NEEDED FOR  HEADACHE 05/15/20   Lomax, Amy, NP  Erenumab-aooe (AIMOVIG) 140 MG/ML SOAJ Inject 140 mg into the skin every 28 (twenty-eight) days. 07/24/20   Lomax, Amy, NP  gabapentin (NEURONTIN) 300 MG capsule Take 1 capsule (300 mg total) by mouth 2 (two) times daily. 02/28/20   Lomax, Amy, NP  hydrochlorothiazide (HYDRODIURIL) 25 MG tablet Take 25 mg by mouth daily.     [provider]  HYDROcodone-acetaminophen (NORCO/VICODIN) 5-325 MG tablet Take 1 tablet by mouth every 4 (four) hours as needed. 09/27/20   Jeannie Fend, PA-C  lamoTRIgine (LAMICTAL) 100 MG tablet TAKE 1 TABLET(100 MG) BY MOUTH TWICE DAILY 10/29/19   Lomax, Amy, NP  levETIRAcetam (KEPPRA) 750 MG tablet Take 1-2 tablets (750-1,500 mg total) by mouth See admin instructions. TAKE 1 TABLET BY MOUTH EVERY MORNING AND 2 TABLETS EVERY NIGHT AT BEDTIME 03/13/20   Lomax, Amy, NP  ondansetron (ZOFRAN ODT) 4 MG disintegrating tablet Take 1 tablet (4 mg total) by mouth every 8 (eight) hours as needed for nausea or vomiting. 09/27/20   Jeannie Fend, PA-C  ondansetron (ZOFRAN) 4 MG tablet Take 1 tablet (4 mg total) by mouth every 6 (six) hours. 03/29/20   Arlyn Dunning, PA-C  propranolol (INDERAL) 20 MG tablet Take 1 tablet (20 mg total) by mouth 2 (two) times daily. 07/07/20   Lomax, Amy, NP  rizatriptan (MAXALT-MLT) 10 MG disintegrating tablet TAKE 1 TABLET BY MOUTH AS NEEDED MAY REPEAT IN 2 HOURS IF NEEDED 07/10/20   Lomax, Amy, NP  tiZANidine (ZANAFLEX) 4 MG tablet TAKE 1 TABLET(4 MG) BY MOUTH EVERY 6 HOURS AS NEEDED FOR BACK PAIN 02/26/20   Merlyn Albert, MD    Allergies    Bactrim [sulfamethoxazole-trimethoprim], Codeine, and Adhesive [tape]  Review of Systems   Review of Systems  Constitutional: Negative for chills and fever.  Gastrointestinal: Positive for nausea and vomiting. Negative for abdominal pain, constipation and diarrhea.  Genitourinary: Positive for flank pain.  Musculoskeletal: Positive for back pain.  Skin: Negative for  rash and wound.  Allergic/Immunologic: Negative for immunocompromised state.  Neurological: Negative for weakness.  All other systems reviewed and are negative.   Physical Exam Updated Vital Signs BP 117/87   Pulse 86   Temp 98.2 F (36.8 C) (Oral)   Resp 15   SpO2 99%   Physical Exam Vitals and nursing note reviewed.  Constitutional:      General: He is not in acute distress.    Appearance: He is well-developed. He is not diaphoretic.  HENT:     Head: Normocephalic and atraumatic.  Cardiovascular:     Rate and Rhythm: Normal rate and regular rhythm.     Pulses: Normal pulses.     Heart sounds: Normal heart sounds.  Pulmonary:     Effort: Pulmonary effort is normal.     Breath sounds: Normal breath sounds.  Abdominal:     Palpations: Abdomen is soft.     Tenderness: There is no abdominal tenderness. There is no right CVA tenderness or left CVA tenderness.  Skin:    General: Skin  is warm and dry.     Findings: No erythema or rash.  Neurological:     Mental Status: He is alert and oriented to person, place, and time.  Psychiatric:        Behavior: Behavior normal.     ED Results / Procedures / Treatments   Labs (all labs ordered are listed, but only abnormal results are displayed) Labs Reviewed  BASIC METABOLIC PANEL - Abnormal; Notable for the following components:      Result Value   Glucose, Bld 126 (*)    All other components within normal limits  CBC WITH DIFFERENTIAL/PLATELET - Abnormal; Notable for the following components:   Neutro Abs 8.4 (*)    All other components within normal limits  URINALYSIS, ROUTINE W REFLEX MICROSCOPIC    EKG None  Radiology US Renal  Result Date: 09/27/2020 CLINICAL DATA:  LEFT flank pain EXAM: RENAL / URINARY TRACT ULTRASOUND COMPLETE COMPARISON:  April 10th 2021 FINDINGS: Right Kidney: Renal measurements: 11.1 x 4.6 x 5.6 cm = volume: 150 mL. Echogenicity is within normal limits. Multiple shadowing calculi are noted,  the largest of which measures 4 x 7 mm. Left Kidney: Renal measurements: 13.1 x 6.5 x 7.0 cm = volume: 311 mL. Echogenicity is within normal limits. There is moderate hydronephrosis. Multiple shadowing calculi are seen with largest in the inferior pole measuring at least 5 mm. The proximal LEFT ureter is visualized. The distal LEFT ureter is not visualized. Bladder: Appears normal for degree of bladder distention. Other: None. IMPRESSION: 1. There is moderate LEFT hydroureteronephrosis, similar to CT in April of 2021. The distal ureter is not visualized. No obstructing urolithiasis is visualized. In April, the obstructing urolithiasis was within the mid ureter. 2. Multiple bilateral nephrolithiasis. Electronically Signed   By: Meda Klinefelter MD   On: 09/27/2020 12:12    Procedures Procedures (including critical care time)  Medications Ordered in ED Medications  sodium chloride 0.9 % bolus 1,000 mL (0 mLs Intravenous Stopped 09/27/20 1107)  ondansetron (ZOFRAN) injection 4 mg (4 mg Intravenous Given 09/27/20 0921)  HYDROmorphone (DILAUDID) injection 1 mg (1 mg Intravenous Given 09/27/20 0921)  HYDROmorphone (DILAUDID) injection 1 mg (1 mg Intravenous Given 09/27/20 1151)  ketorolac (TORADOL) 30 MG/ML injection 30 mg (30 mg Intravenous Given 09/27/20 1306)    ED Course  I have reviewed the triage vital signs and the nursing notes.  Pertinent labs & imaging results that were available during my care of the patient were reviewed by me and considered in my medical decision making (see chart for details).  Clinical Course as of Sep 27 1336  Sat Sep 27, 2020  [id number] 29 year old male with history of multiple kidney stones presents with left flank pain and vomiting since last night. On exam, patient is uncomfortable, no CVA tenderness, abdomen is soft and nontender. Patient was given Dilaudid and Zofran and IV fluids.  Plan is for ultrasound of the kidneys due to history of multiple stones in attempts to  limit radiation exposure.  Patient is agreeable with plan.  Labs returned and are without significant findings including CBC and BMP which shows normal renal function.  Urinalysis is pending collection.  Patient is afebrile with normal white blood cell count, no dysuria, doubt UTI or infected stone.  Ultrasound returns with moderate left-sided hydronephrosis, no visible ureteral stone.  Discussed with patient, may have a distal stone as no stone is identified although does have hydro-.  Patient was given morphine, pain has improved however  patient does not feel ready to go home at this point.  Patient would like to try Toradol for pain control.  Discussed CT imaging, patient like to hold off at this time.  If pain is controlled with Toradol, patient may be discharged to follow-up with his urologist with strict return to ER precautions.   [LM]  1336 Pain controlled after Toradol, patient feels ready for dc at this time. Given rx for Norco and Zofran, patient has phenergan and flomax at home. Plan is to follow up with urology on Monday if needed, return to ER for worsening or concerning symptoms.    [LM]    Clinical Course User Index [LM] Alden Hipp   MDM Rules/Calculators/A&P                          Final Clinical Impression(s) / ED Diagnoses Final diagnoses:  Ureterolithiasis    Rx / DC Orders ED Discharge Orders         Ordered    HYDROcodone-acetaminophen (NORCO/VICODIN) 5-325 MG tablet  Every 4 hours PRN        09/27/20 1334    ondansetron (ZOFRAN ODT) 4 MG disintegrating tablet  Every 8 hours PRN        09/27/20 1334           Jeannie Fend, PA-C 09/27/20 1337    Tegeler, Canary Brim, MD 09/27/20 1343

## 2020-10-05 IMAGING — CT CT RENAL STONE PROTOCOL
2 of 4 series · 16 of 46 positions shown, 18 images · non-contrast
Comparison: 02/27/2019

CLINICAL DATA: Left flank pain

EXAM:
CT ABDOMEN AND PELVIS WITHOUT CONTRAST
TECHNIQUE: Multidetector CT imaging of the abdomen and pelvis was performed
following the standard protocol without IV contrast.

[Series 3: stone study 5.0 i30f 2 · axial · 0.98mm/px · z∈[-476,-36]mm · 13 of 96 slices shown, 15 images]
[im 4/96  soft-tissue]
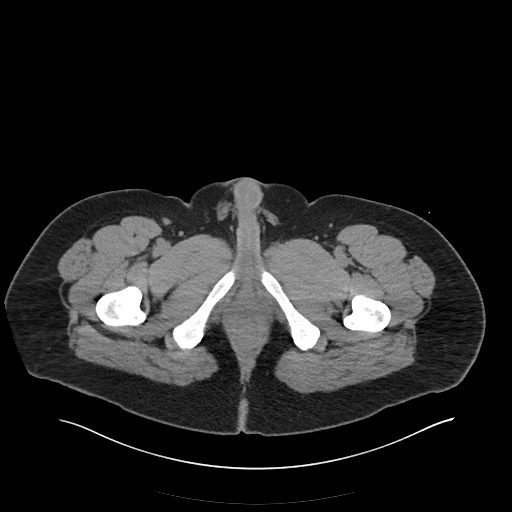
[im 4/96  bone]
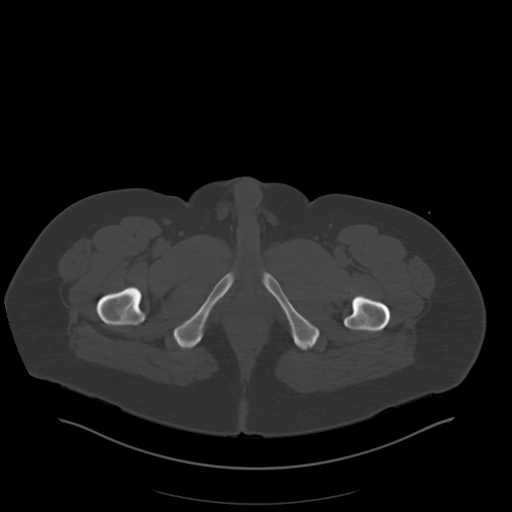
[im 12/96  soft-tissue]
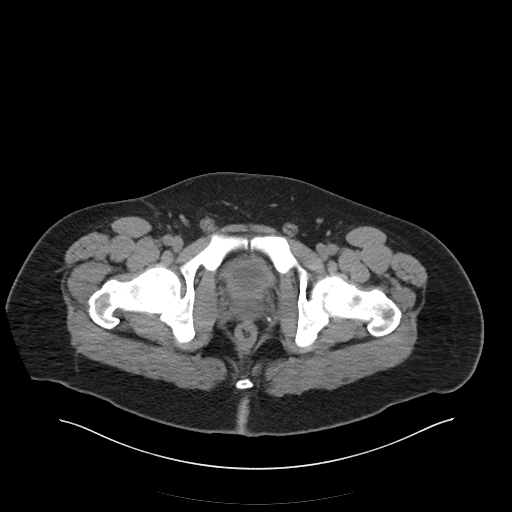
[im 20/96  soft-tissue]
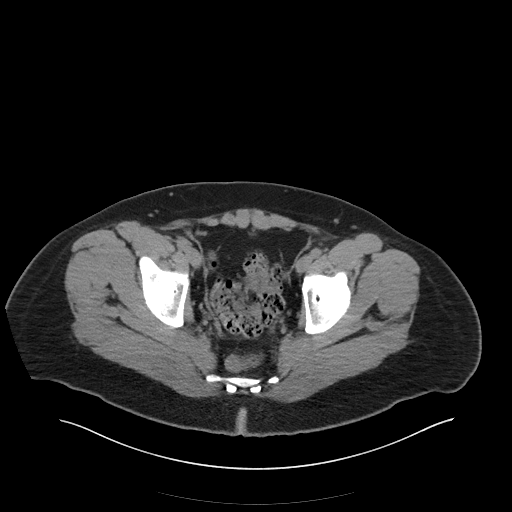
[im 28/96  soft-tissue]
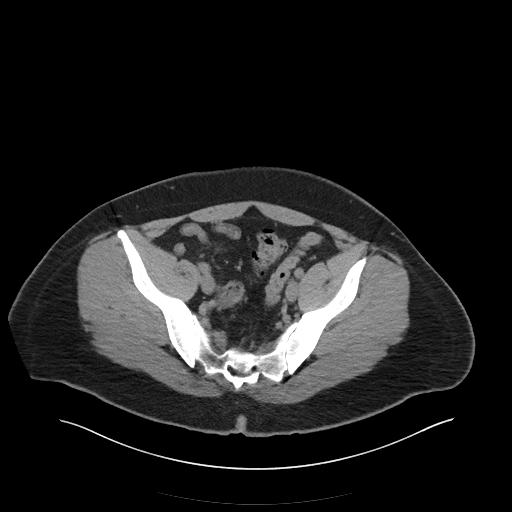
[im 32/96  soft-tissue]
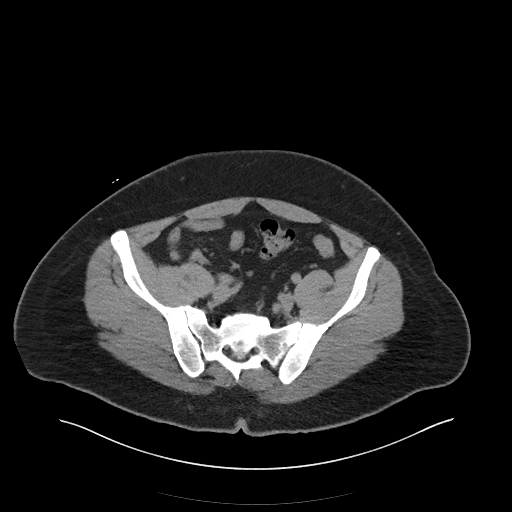
[im 40/96  soft-tissue]
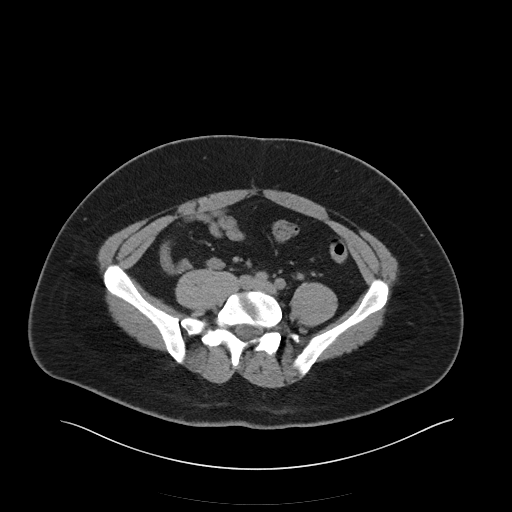
[im 48/96  soft-tissue]
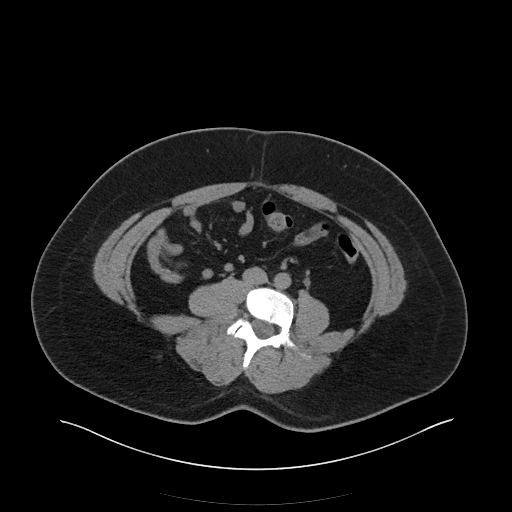
[im 56/96  soft-tissue]
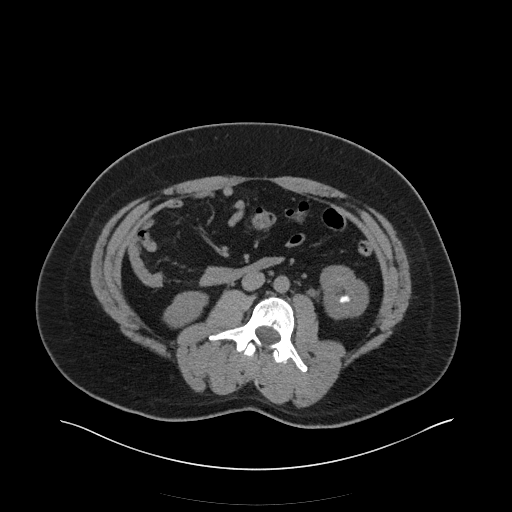
[im 64/96  soft-tissue]
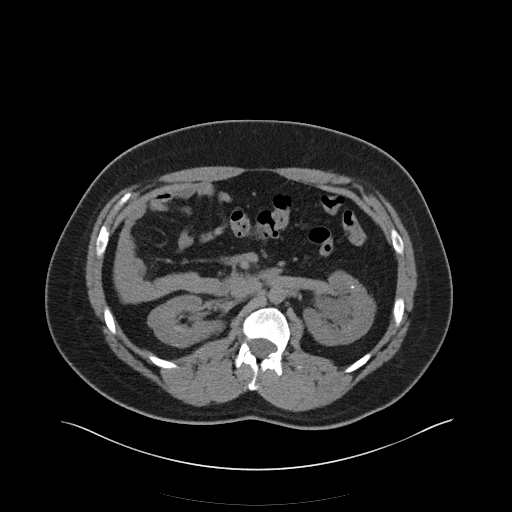
[im 64/96  bone]
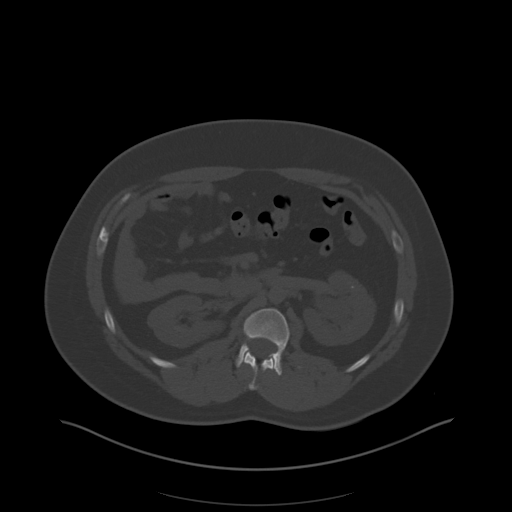
[im 68/96  soft-tissue]
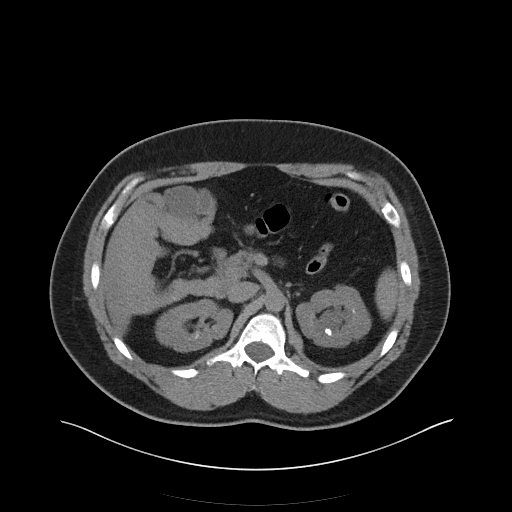
[im 76/96  soft-tissue]
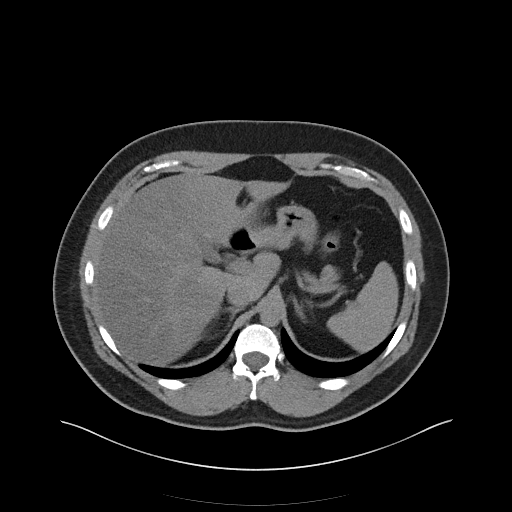
[im 84/96  soft-tissue]
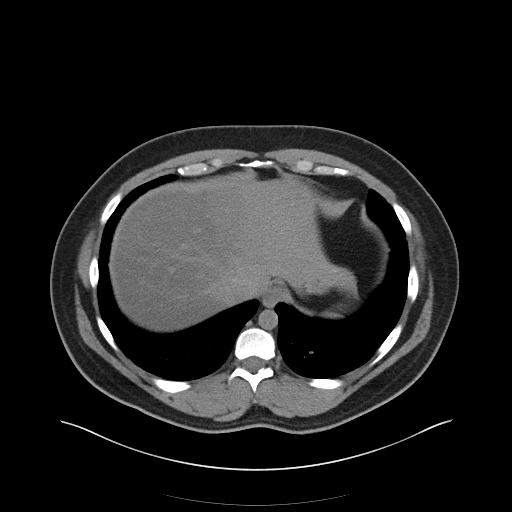
[im 92/96  soft-tissue]
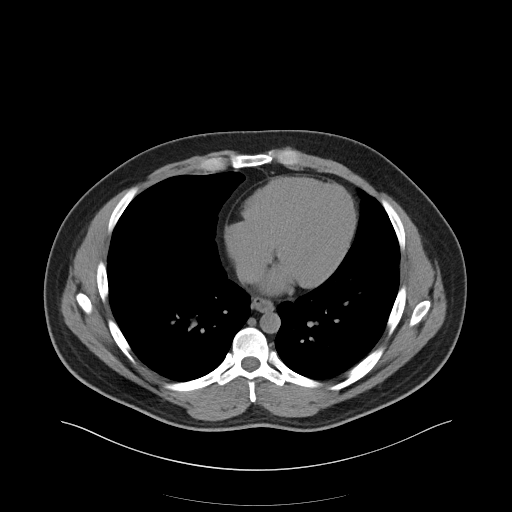

[Series 6: coronal soft tissue · coronal · 0.93mm/px · 3 of 112 slices shown]
[im 38/112  soft-tissue]
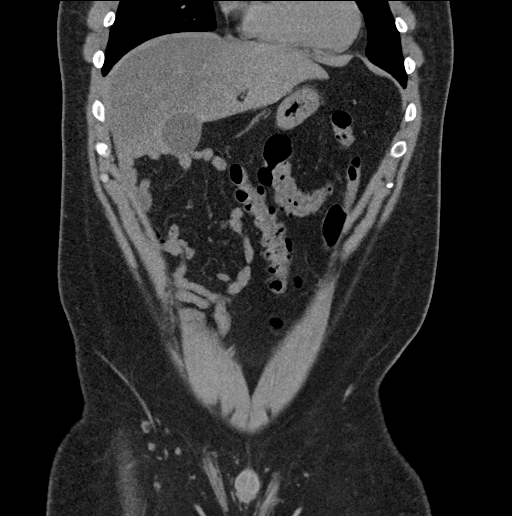
[im 50/112  soft-tissue]
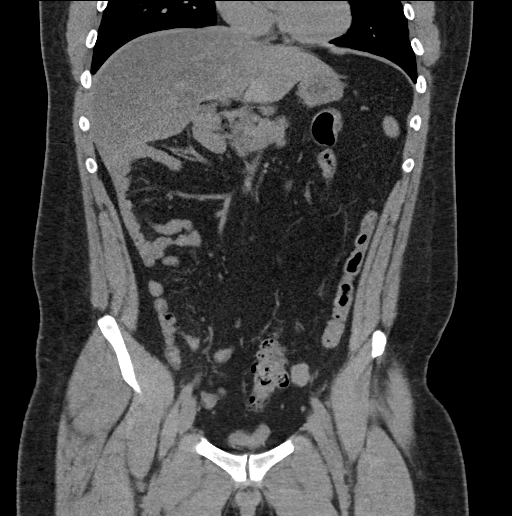
[im 62/112  soft-tissue]
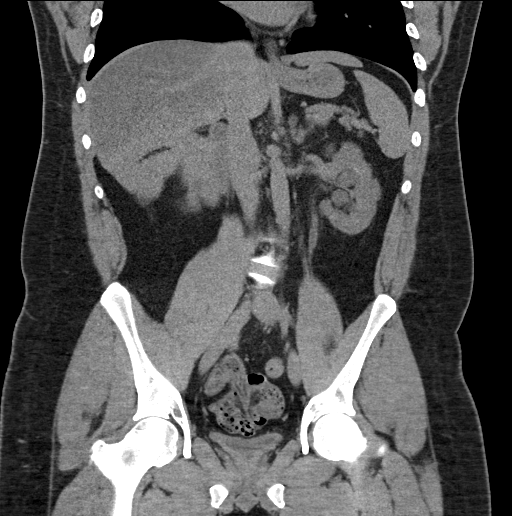

[16 of 46 positions shown; findings below may reference images not displayed]

FINDINGS: Lower chest: No acute abnormality.

Hepatobiliary: Diffusely decreased attenuation of the hepatic
parenchyma compatible with hepatic steatosis. No focal hepatic
lesion is identified. Gallbladder is unremarkable. No biliary
dilatation.

Pancreas: Unremarkable. No pancreatic ductal dilatation or
surrounding inflammatory changes.

Spleen: Normal in size without focal abnormality.

Adrenals/Urinary Tract: Unremarkable adrenal glands. Numerous
bilateral punctate renal calculi, increased in size and number from
prior exam including a 8 mm calculus within the lower pole of the
left kidney and a 6 mm calculus in the superior pole of the right
kidney. 3 mm calculus within the mid left ureter (series 3, image
51) resulting in moderate left hydronephrosis. No right-sided
hydronephrosis. Right ureter is unremarkable. Urinary bladder is
decompressed, limiting its evaluation.

Stomach/Bowel: Congenital malrotation of the bowel is again noted.
No evidence of bowel obstruction or inflammatory process. Normal
appendix within the low pelvis.

Vascular/Lymphatic: No significant vascular findings are present. No
enlarged abdominal or pelvic lymph nodes.

Reproductive: Prostate is unremarkable.

Other: No abdominal wall hernia or abnormality. No abdominopelvic
ascites.

Musculoskeletal: Levo scoliotic curvature of the lumbar spine with
congenital segmentation anomaly at the lumbosacral junction,
unchanged. No acute osseous findings.
IMPRESSION: 1. Obstructing 3 mm calculus within the mid left ureter resulting in
moderate left hydronephrosis.
2. Numerous bilateral punctate renal calculi, increased in size and
number from prior exam.
3. Hepatic steatosis.

## 2021-01-01 ENCOUNTER — Ambulatory Visit: Payer: 59 | Admitting: Family Medicine

## 2021-01-01 ENCOUNTER — Encounter: Payer: Self-pay | Admitting: Family Medicine
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
## 2021-03-11 ENCOUNTER — Emergency Department (HOSPITAL_COMMUNITY): Payer: Self-pay

## 2021-03-11 ENCOUNTER — Emergency Department (HOSPITAL_COMMUNITY): Payer: Self-pay | Admitting: Anesthesiology

## 2021-03-11 ENCOUNTER — Ambulatory Visit (HOSPITAL_COMMUNITY)
Admission: EM | Admit: 2021-03-11 | Discharge: 2021-03-11 | Disposition: A | Discharge status: Home / Self Care | Payer: Self-pay | Attending: Emergency Medicine | Admitting: Emergency Medicine

## 2021-03-11 ENCOUNTER — Other Ambulatory Visit: Payer: Self-pay

## 2021-03-11 ENCOUNTER — Encounter (HOSPITAL_COMMUNITY)
Admission: EM | Disposition: A | Discharge status: Home / Self Care | Payer: Self-pay | Source: Home / Self Care | Attending: Emergency Medicine

## 2021-03-11 ENCOUNTER — Encounter (HOSPITAL_COMMUNITY): Payer: Self-pay

## 2021-03-11 DIAGNOSIS — Z87891 Personal history of nicotine dependence: Secondary | ICD-10-CM | POA: Insufficient documentation

## 2021-03-11 DIAGNOSIS — N1330 Unspecified hydronephrosis: Secondary | ICD-10-CM | POA: Diagnosis not present

## 2021-03-11 DIAGNOSIS — N200 Calculus of kidney: Secondary | ICD-10-CM | POA: Diagnosis not present

## 2021-03-11 DIAGNOSIS — R112 Nausea with vomiting, unspecified: Secondary | ICD-10-CM

## 2021-03-11 DIAGNOSIS — Z87442 Personal history of urinary calculi: Secondary | ICD-10-CM | POA: Insufficient documentation

## 2021-03-11 DIAGNOSIS — Z20822 Contact with and (suspected) exposure to covid-19: Secondary | ICD-10-CM | POA: Insufficient documentation

## 2021-03-11 DIAGNOSIS — N132 Hydronephrosis with renal and ureteral calculous obstruction: Secondary | ICD-10-CM | POA: Insufficient documentation

## 2021-03-11 HISTORY — PX: CYSTOSCOPY WITH RETROGRADE PYELOGRAM, URETEROSCOPY AND STENT PLACEMENT: SHX5789

## 2021-03-11 LAB — CBC
HCT: 45.1 % (ref 39.0–52.0)
Hemoglobin: 15.9 g/dL (ref 13.0–17.0)
MCH: 31.4 pg (ref 26.0–34.0)
MCHC: 35.3 g/dL (ref 30.0–36.0)
MCV: 89 fL (ref 80.0–100.0)
Platelets: 211 10*3/uL (ref 150–400)
RBC: 5.07 MIL/uL (ref 4.22–5.81)
RDW: 11.8 % (ref 11.5–15.5)
WBC: 7.4 10*3/uL (ref 4.0–10.5)
nRBC: 0 % (ref 0.0–0.2)

## 2021-03-11 LAB — URINALYSIS, ROUTINE W REFLEX MICROSCOPIC
Bilirubin Urine: NEGATIVE
Glucose, UA: NEGATIVE mg/dL
Hgb urine dipstick: NEGATIVE
Ketones, ur: 20 mg/dL — AB
Nitrite: NEGATIVE
Protein, ur: NEGATIVE mg/dL
Specific Gravity, Urine: 1.024 (ref 1.005–1.030)
pH: 5 (ref 5.0–8.0)

## 2021-03-11 LAB — RESP PANEL BY RT-PCR (FLU A&B, COVID) ARPGX2
Influenza A by PCR: NEGATIVE
Influenza B by PCR: NEGATIVE
SARS Coronavirus 2 by RT PCR: NEGATIVE

## 2021-03-11 LAB — BASIC METABOLIC PANEL
Anion gap: 11 (ref 5–15)
BUN: 18 mg/dL (ref 6–20)
CO2: 25 mmol/L (ref 22–32)
Calcium: 9.4 mg/dL (ref 8.9–10.3)
Chloride: 98 mmol/L (ref 98–111)
Creatinine, Ser: 1.38 mg/dL — ABNORMAL HIGH (ref 0.61–1.24)
GFR, Estimated: >60 mL/min (ref >60)
Glucose, Bld: 105 mg/dL — ABNORMAL HIGH (ref 70–99)
Potassium: 3.8 mmol/L (ref 3.5–5.1)
Sodium: 134 mmol/L — ABNORMAL LOW (ref 135–145)

## 2021-03-11 SURGERY — CYSTOURETEROSCOPY, WITH RETROGRADE PYELOGRAM AND STENT INSERTION
Anesthesia: General | Site: Urethra | Laterality: Left

## 2021-03-11 MED ORDER — FENTANYL CITRATE (PF) 100 MCG/2ML IJ SOLN
INTRAMUSCULAR | Status: AC
  Filled 2021-03-11: qty 2

## 2021-03-11 MED ORDER — LIDOCAINE 2% (20 MG/ML) 5 ML SYRINGE
INTRAMUSCULAR | Status: AC
  Filled 2021-03-11: qty 5

## 2021-03-11 MED ORDER — ONDANSETRON HCL 4 MG/2ML IJ SOLN
4.0000 mg | Freq: Once | INTRAMUSCULAR | Status: AC
  Administered 2021-03-11: 4 mg via INTRAVENOUS
  Filled 2021-03-11: qty 2

## 2021-03-11 MED ORDER — SODIUM CHLORIDE 0.9 % IV SOLN
1.0000 g | Freq: Once | INTRAVENOUS | Status: AC
  Administered 2021-03-11: 1 g via INTRAVENOUS
  Filled 2021-03-11: qty 10

## 2021-03-11 MED ORDER — ROCURONIUM BROMIDE 10 MG/ML (PF) SYRINGE
PREFILLED_SYRINGE | INTRAVENOUS | Status: AC
  Filled 2021-03-11: qty 10

## 2021-03-11 MED ORDER — HYDROCODONE-ACETAMINOPHEN 5-325 MG PO TABS: 1.0000 | ORAL_TABLET | ORAL | 0 refills | Status: DC | PRN

## 2021-03-11 MED ORDER — CHLORHEXIDINE GLUCONATE 0.12 % MT SOLN
15.0000 mL | OROMUCOSAL | Status: AC
  Administered 2021-03-11: 15 mL via OROMUCOSAL
  Filled 2021-03-11: qty 15

## 2021-03-11 MED ORDER — SUCCINYLCHOLINE CHLORIDE 200 MG/10ML IV SOSY
PREFILLED_SYRINGE | INTRAVENOUS | Status: DC | PRN
  Administered 2021-03-11: 120 mg via INTRAVENOUS

## 2021-03-11 MED ORDER — HYDROMORPHONE HCL 1 MG/ML IJ SOLN
1.0000 mg | Freq: Once | INTRAMUSCULAR | Status: AC
  Administered 2021-03-11: 1 mg via INTRAVENOUS
  Filled 2021-03-11: qty 1

## 2021-03-11 MED ORDER — LIDOCAINE 2% (20 MG/ML) 5 ML SYRINGE
INTRAMUSCULAR | Status: DC | PRN
  Administered 2021-03-11: 60 mg via INTRAVENOUS
  Administered 2021-03-11: 40 mg via INTRAVENOUS

## 2021-03-11 MED ORDER — PROPOFOL 10 MG/ML IV BOLUS
INTRAVENOUS | Status: AC
  Filled 2021-03-11: qty 20

## 2021-03-11 MED ORDER — HYDROMORPHONE HCL 1 MG/ML IJ SOLN
1.0000 mg | INTRAMUSCULAR | Status: DC | PRN
  Administered 2021-03-11: 1 mg via INTRAVENOUS
  Filled 2021-03-11: qty 1

## 2021-03-11 MED ORDER — MIDAZOLAM HCL 2 MG/2ML IJ SOLN
INTRAMUSCULAR | Status: DC | PRN
  Administered 2021-03-11: 2 mg via INTRAVENOUS

## 2021-03-11 MED ORDER — FENTANYL CITRATE (PF) 100 MCG/2ML IJ SOLN
25.0000 ug | INTRAMUSCULAR | Status: DC | PRN
  Administered 2021-03-11 (×2): 50 ug via INTRAVENOUS

## 2021-03-11 MED ORDER — ACETAMINOPHEN 10 MG/ML IV SOLN
INTRAVENOUS | Status: AC
  Administered 2021-03-11: 1000 mg via INTRAVENOUS
  Filled 2021-03-11: qty 100

## 2021-03-11 MED ORDER — FENTANYL CITRATE (PF) 100 MCG/2ML IJ SOLN
INTRAMUSCULAR | Status: DC | PRN
  Administered 2021-03-11: 100 ug via INTRAVENOUS
  Administered 2021-03-11 (×2): 50 ug via INTRAVENOUS
  Administered 2021-03-11: 100 ug via INTRAVENOUS
  Administered 2021-03-11 (×2): 50 ug via INTRAVENOUS

## 2021-03-11 MED ORDER — IOHEXOL 300 MG/ML  SOLN
INTRAMUSCULAR | Status: DC | PRN
  Administered 2021-03-11: 50 mL via URETHRAL

## 2021-03-11 MED ORDER — DEXAMETHASONE SODIUM PHOSPHATE 10 MG/ML IJ SOLN
INTRAMUSCULAR | Status: DC | PRN
  Administered 2021-03-11: 10 mg via INTRAVENOUS

## 2021-03-11 MED ORDER — EPHEDRINE 5 MG/ML INJ
INTRAVENOUS | Status: AC
  Filled 2021-03-11: qty 10

## 2021-03-11 MED ORDER — FENTANYL CITRATE (PF) 100 MCG/2ML IJ SOLN
INTRAMUSCULAR | Status: AC
  Administered 2021-03-11: 50 ug via INTRAVENOUS
  Filled 2021-03-11: qty 2

## 2021-03-11 MED ORDER — ACETAMINOPHEN 10 MG/ML IV SOLN: 1000.0000 mg | Freq: Once | INTRAVENOUS | Status: AC

## 2021-03-11 MED ORDER — ONDANSETRON HCL 4 MG/2ML IJ SOLN
INTRAMUSCULAR | Status: DC | PRN
  Administered 2021-03-11: 4 mg via INTRAVENOUS

## 2021-03-11 MED ORDER — DEXAMETHASONE SODIUM PHOSPHATE 10 MG/ML IJ SOLN
INTRAMUSCULAR | Status: AC
  Filled 2021-03-11: qty 2

## 2021-03-11 MED ORDER — PHENYLEPHRINE 40 MCG/ML (10ML) SYRINGE FOR IV PUSH (FOR BLOOD PRESSURE SUPPORT)
PREFILLED_SYRINGE | INTRAVENOUS | Status: AC
  Filled 2021-03-11: qty 20

## 2021-03-11 MED ORDER — ONDANSETRON HCL 4 MG/2ML IJ SOLN
INTRAMUSCULAR | Status: AC
  Administered 2021-03-11: 4 mg via INTRAVENOUS
  Filled 2021-03-11: qty 2

## 2021-03-11 MED ORDER — PROPOFOL 10 MG/ML IV BOLUS
INTRAVENOUS | Status: DC | PRN
  Administered 2021-03-11: 20 mg via INTRAVENOUS
  Administered 2021-03-11: 30 mg via INTRAVENOUS
  Administered 2021-03-11: 200 mg via INTRAVENOUS

## 2021-03-11 MED ORDER — LACTATED RINGERS IV SOLN: INTRAVENOUS | Status: DC

## 2021-03-11 MED ORDER — ONDANSETRON HCL 4 MG/2ML IJ SOLN: 4.0000 mg | Freq: Once | INTRAMUSCULAR | Status: AC

## 2021-03-11 MED ORDER — ONDANSETRON HCL 4 MG/2ML IJ SOLN
INTRAMUSCULAR | Status: AC
  Filled 2021-03-11: qty 4

## 2021-03-11 MED ORDER — LACTATED RINGERS IV SOLN: INTRAVENOUS | Status: DC | PRN

## 2021-03-11 MED ORDER — FENTANYL CITRATE (PF) 100 MCG/2ML IJ SOLN: 50.0000 ug | INTRAMUSCULAR | Status: DC | PRN

## 2021-03-11 MED ORDER — SODIUM CHLORIDE 0.9 % IV BOLUS
1000.0000 mL | Freq: Once | INTRAVENOUS | Status: AC
  Administered 2021-03-11: 1000 mL via INTRAVENOUS

## 2021-03-11 MED ORDER — FENTANYL CITRATE (PF) 100 MCG/2ML IJ SOLN
50.0000 ug | Freq: Once | INTRAMUSCULAR | Status: AC
  Administered 2021-03-11: 50 ug via INTRAVENOUS
  Filled 2021-03-11: qty 2

## 2021-03-11 MED ORDER — SUCCINYLCHOLINE CHLORIDE 200 MG/10ML IV SOSY
PREFILLED_SYRINGE | INTRAVENOUS | Status: AC
  Filled 2021-03-11: qty 10

## 2021-03-11 MED ORDER — SODIUM CHLORIDE 0.9 % IV SOLN
12.5000 mg | Freq: Once | INTRAVENOUS | Status: AC
  Administered 2021-03-11: 12.5 mg via INTRAVENOUS
  Filled 2021-03-11: qty 0.5

## 2021-03-11 MED ORDER — CEPHALEXIN 500 MG PO CAPS: 500.0000 mg | ORAL_CAPSULE | Freq: Two times a day (BID) | ORAL | 0 refills | Status: DC

## 2021-03-11 MED ORDER — MIDAZOLAM HCL 2 MG/2ML IJ SOLN
INTRAMUSCULAR | Status: AC
  Filled 2021-03-11: qty 2

## 2021-03-11 MED ORDER — SODIUM CHLORIDE 0.9 % IR SOLN
Status: DC | PRN
  Administered 2021-03-11: 3000 mL

## 2021-03-11 MED ORDER — KETOROLAC TROMETHAMINE 15 MG/ML IJ SOLN
15.0000 mg | Freq: Once | INTRAMUSCULAR | Status: AC
  Administered 2021-03-11: 15 mg via INTRAVENOUS
  Filled 2021-03-11: qty 1

## 2021-03-11 SURGICAL SUPPLY — 21 items
BAG URO CATCHER STRL LF (MISCELLANEOUS) ×2 IMPLANT
CATH INTERMIT  6FR 70CM (CATHETERS) ×2 IMPLANT
CLOTH BEACON ORANGE TIMEOUT ST (SAFETY) ×2 IMPLANT
EXTRACTOR STONE NITINOL NGAGE (UROLOGICAL SUPPLIES) IMPLANT
GLOVE BIOGEL M 8.0 STRL (GLOVE) ×2 IMPLANT
GOWN STRL REUS W/TWL XL LVL3 (GOWN DISPOSABLE) ×2 IMPLANT
GUIDEWIRE ANG ZIPWIRE 038X150 (WIRE) ×2 IMPLANT
GUIDEWIRE STR DUAL SENSOR (WIRE) ×2 IMPLANT
IV NS 1000ML (IV SOLUTION) ×2
IV NS 1000ML BAXH (IV SOLUTION) ×1 IMPLANT
KIT TURNOVER KIT A (KITS) ×2 IMPLANT
LASER FIB FLEXIVA PULSE ID 365 (Laser) ×2 IMPLANT
MANIFOLD NEPTUNE II (INSTRUMENTS) ×2 IMPLANT
PACK CYSTO (CUSTOM PROCEDURE TRAY) ×2 IMPLANT
SHEATH URETERAL 12FRX28CM (UROLOGICAL SUPPLIES) IMPLANT
SHEATH URETERAL 12FRX35CM (MISCELLANEOUS) ×2 IMPLANT
SHEATH URETERAL 12FRX55CM (UROLOGICAL SUPPLIES) IMPLANT
TRACTIP FLEXIVA PULS ID 200XHI (Laser) IMPLANT
TRACTIP FLEXIVA PULSE ID 200 (Laser)
TUBING CONNECTING 10 (TUBING) ×2 IMPLANT
TUBING UROLOGY SET (TUBING) ×2 IMPLANT

## 2021-03-11 NOTE — ED Notes (Addendum)
Pt actively vomiting after drinking 3oz of water

## 2021-03-11 NOTE — Anesthesia Preprocedure Evaluation (Addendum)
Anesthesia Evaluation  Patient identified by MRN, date of birth, ID band Patient awake    Reviewed: Allergy & Precautions, H&P , NPO status , Patient's Chart, lab work & pertinent test results  History of Anesthesia Complications (+) PONV  Airway Mallampati: II  TM Distance: >3 FB Neck ROM: Full    Dental no notable dental hx. (+) Teeth Intact, Dental Advisory Given   Pulmonary neg pulmonary ROS, former smoker,    Pulmonary exam normal breath sounds clear to auscultation       Cardiovascular negative cardio ROS   Rhythm:Regular Rate:Normal     Neuro/Psych  Headaches, negative psych ROS   GI/Hepatic negative GI ROS, Neg liver ROS,   Endo/Other  negative endocrine ROS  Renal/GU Renal disease  negative genitourinary   Musculoskeletal   Abdominal   Peds  Hematology negative hematology ROS (+)   Anesthesia Other Findings   Reproductive/Obstetrics negative OB ROS                            Anesthesia Physical Anesthesia Plan  ASA: II  Anesthesia Plan: General   Post-op Pain Management:    Induction: Intravenous, Rapid sequence and Cricoid pressure planned  PONV Risk Score and Plan: 4 or greater and Ondansetron, Dexamethasone and Midazolam  Airway Management Planned: Oral ETT  Additional Equipment:   Intra-op Plan:   Post-operative Plan: Extubation in OR  Informed Consent: I have reviewed the patients History and Physical, chart, labs and discussed the procedure including the risks, benefits and alternatives for the proposed anesthesia with the patient or authorized representative who has indicated his/her understanding and acceptance.     Dental advisory given  Plan Discussed with: CRNA  Anesthesia Plan Comments:        Anesthesia Quick Evaluation

## 2021-03-11 NOTE — ED Triage Notes (Signed)
Pt arrived via walk in, c/o right sided flank pain since Monday night. States extensive hx of kidney stones and related surgeries, this feels similar.

## 2021-03-11 NOTE — Anesthesia Procedure Notes (Signed)
Date/Time: 03/11/2021 8:55 PM Performed by: Minerva Ends, CRNA Oxygen Delivery Method: Simple face mask Placement Confirmation: positive ETCO2 and breath sounds checked- equal and bilateral Dental Injury: Teeth and Oropharynx as per pre-operative assessment

## 2021-03-11 NOTE — Progress Notes (Signed)
Informed Dr Sampson Goon of patient's pain and nausea. Orders received.

## 2021-03-11 NOTE — Anesthesia Procedure Notes (Signed)
Procedure Name: Intubation Date/Time: 03/11/2021 7:32 PM Performed by: Minerva Ends, CRNA Pre-anesthesia Checklist: Patient identified, Emergency Drugs available, Suction available and Patient being monitored Patient Re-evaluated:Patient Re-evaluated prior to induction Oxygen Delivery Method: Circle System Utilized Preoxygenation: Pre-oxygenation with 100% oxygen Induction Type: IV induction, Rapid sequence and Cricoid Pressure applied Ventilation: Mask ventilation without difficulty Laryngoscope Size: Miller and 2 Grade View: Grade II Tube type: Oral Number of attempts: 1 Airway Equipment and Method: Stylet Placement Confirmation: ETT inserted through vocal cords under direct vision,  positive ETCO2 and breath sounds checked- equal and bilateral Secured at: 22 cm Tube secured with: Tape Dental Injury: Teeth and Oropharynx as per pre-operative assessment  Comments: Smooth RSI Sampson Goon-- intubation AM CRNA atraumatic-- teeth and mouth as preop-- small chip right front tooth unchanged with laryngoscopy- bilat BS Sampson Goon

## 2021-03-11 NOTE — Anesthesia Postprocedure Evaluation (Signed)
Anesthesia Post Note  Patient: JAXSYN AZAM  Procedure(s) Performed: CYSTOSCOPY WITH RETROGRADE PYELOGRAM, URETEROSCOPY AND STENT PLACEMENT,STONE EXTRACTION,HOLMIUM LASER (Left Urethra)     Patient location during evaluation: PACU Anesthesia Type: General Level of consciousness: awake and alert Pain management: pain level controlled Vital Signs Assessment: post-procedure vital signs reviewed and stable Respiratory status: spontaneous breathing, nonlabored ventilation and respiratory function stable Cardiovascular status: blood pressure returned to baseline and stable Postop Assessment: no apparent nausea or vomiting Anesthetic complications: no   No complications documented.  Last Vitals:  Vitals:   03/11/21 1730 03/11/21 2105  BP: 123/80 (!) 142/93  Pulse: 73 (!) 102  Resp: 18 15  Temp:  36.7 C  SpO2: 100% 100%    Last Pain:  Vitals:   03/11/21 1908  TempSrc:   PainSc: 4                  Margarine Grosshans,W. EDMOND

## 2021-03-11 NOTE — H&P (Signed)
H&P  Chief Complaint: Kidney stone  History of Present Illness: 30 year old male with long history of recurrent urolithiasis presented to the emergency room today with 2 days of left flank pain, nausea and vomiting.  No fever, no chills.  In the emergency room he had the usual evaluation for kidney stone including CT scan which revealed a 13 x 7 mm left proximal ureteral stone with severe hydronephrosis.  Bilateral renal calculi were also seen.  It has been difficult to get the patient pain-free.  Urologic consultation is requested.  Past Medical History:  Diagnosis Date  . Complication of anesthesia   . Frequency-urgency syndrome   . History of kidney stones   . Hx of nausea and vomiting    d/t kidney stone  . Hx of sepsis 08/11/2017   due to kidney stone/ hydronephrosis  . Left ureteral stone   . Migraine   . Pain due to ureteral stent (HCC)   . PONV (postoperative nausea and vomiting)    none recently  . Renal calculi    bilateral per ct 07-26-2016  . Scoliosis of lumbar spine 1994   treated at [facility] until age 78  . Wears glasses     Past Surgical History:  Procedure Laterality Date  . CYSTOSCOPY W/ RETROGRADES Right 07/06/2015   Procedure: CYSTOSCOPY WITH RETROGRADE PYELOGRAM;  Surgeon: Jerilee Field, MD;  Location: [facility];  Service: Urology;  Laterality: Right;  . CYSTOSCOPY W/ URETERAL STENT PLACEMENT Left 08/08/2015   Procedure: CYSTOSCOPY WITH STENT REPLACEMENT;  Surgeon: Jerilee Field, MD;  Location: [facility];  Service: Urology;  Laterality: Left;  . CYSTOSCOPY W/ URETERAL STENT PLACEMENT Left 02/21/2017   Procedure: CYSTOSCOPY WITH RETROGRADE PYELOGRAM/URETERAL LEFT STENT PLACEMENT WITH  LASER;  Surgeon: Bjorn Pippin, MD;  Location: [facility];  Service: Urology;  Laterality: Left;  . CYSTOSCOPY W/ URETERAL STENT PLACEMENT Left 08/10/2017   Procedure: CYSTOSCOPY WITH RETROGRADE PYELOGRAM/URETERAL STENT PLACEMENT;  Surgeon: Heloise Purpura, MD;  Location: [facility];  Service: Urology;  Laterality: Left;  . CYSTOSCOPY WITH RETROGRADE PYELOGRAM, URETEROSCOPY AND STENT PLACEMENT Left 06/24/2014   Procedure: CYSTOSCOPY WITH RETROGRADE PYELOGRAM,  AND STENT PLACEMENT;  Surgeon: Magdalene Molly, MD;  Location: [facility];  Service: Urology;  Laterality: Left;  . CYSTOSCOPY WITH RETROGRADE PYELOGRAM, URETEROSCOPY AND STENT PLACEMENT Left 07/05/2014   Procedure: CYSTO/LEFT URETEROSCOPY/LEFT RETROGRADE PYELOGRAM/LEFT STENT PLACEMENT;  Surgeon: Jerilee Field, MD;  Location: [facility];  Service: Urology;  Laterality: Left;  . CYSTOSCOPY WITH RETROGRADE PYELOGRAM, URETEROSCOPY AND STENT PLACEMENT Left 07/06/2015   Procedure: CYSTOSCOPY WITH RETROGRADE PYELOGRAM, URETEROSCOPY , LASER, STENT PLACEMENT and BASKET EXTRACTION;  Surgeon: Jerilee Field, MD;  Location: [facility];  Service: Urology;  Laterality: Left;  . CYSTOSCOPY WITH RETROGRADE PYELOGRAM, URETEROSCOPY AND STENT PLACEMENT Left 08/19/2015   Procedure: CYSTOSCOPY WITH RETROGRADE PYELOGRAM, URETEROSCOPY AND STENT PLACEMENT;  Surgeon: Jerilee Field, MD;  Location[facility];  Service: Urology;  Laterality: Left;  . CYSTOSCOPY WITH RETROGRADE PYELOGRAM, URETEROSCOPY AND STENT PLACEMENT Left 03/13/2018   Procedure: CYSTOSCOPY WITH RETROGRADE PYELOGRAM, URETEROSCOPY AND LEFT STENT PLACEMENT;  Surgeon: Bjorn Pippin, MD;  Location[facility];  Service: Urology;  Laterality: Left;  . CYSTOSCOPY WITH URETEROSCOPY AND STENT PLACEMENT Left 01/16/2016   Procedure: CYSTOSCOPY WITH LEFT RETROGRADE PYELOGRAM  LEFT DIGITAL URETEROSCOPY AND PLACEMENT LEFT URETERAL STENT;  Surgeon: Jerilee Field, MD;  Location[facility];  Service: Urology;  Laterality: Left;  . CYSTOSCOPY WITH URETEROSCOPY, STONE BASKETRY AND STENT PLACEMENT Left 02/13/2016  Procedure: CYSTOSCOPY WITH LEFT URETEROSCOPY, HOLMIUM LASER AND STENT PLACEMENT;  Surgeon: Jerilee FieldMatthew Eskridge, MD;  Location[facility];  Service: Urology;  Laterality: Left;  .  CYSTOSCOPY/RETROGRADE/URETEROSCOPY/STONE EXTRACTION WITH BASKET Left 08/08/2015   Procedure: CYSTOSCOPY/RETROGRADE/URETEROSCOPY/STONE EXTRACTION WITH BASKET;  Surgeon: Jerilee FieldMatthew Eskridge, MD;  [facility] [place];  Service: Urology;  Laterality: Left;  . CYSTOSCOPY/URETEROSCOPY/HOLMIUM LASER/STENT PLACEMENT Left 07/30/2016   Procedure: CYSTOSCOPY/URETEROSCOPY/HOLMIUM LASER/STENT PLACEMENT;  Surgeon: Jerilee FieldMatthew Eskridge, MD;  [facility] [place];  Service: Urology;  Laterality: Left;  . CYSTOSCOPY/URETEROSCOPY/HOLMIUM LASER/STENT PLACEMENT Left 08/19/2017   Procedure: CYSTOSCOPY/URETEROSCOPY/HOLMIUM LASER/STENT PLACEMENT;  Surgeon: [name], Matthew, MD;  [facility] [place];  Service: Urology;  Laterality: Left;  . CYSTOSCOPY/URETEROSCOPY/HOLMIUM LASER/STENT PLACEMENT N/A 10/13/2018   Procedure: CYSTOSCOPY/RETROGRADE RIGHT URETEROSCOPY/ AND RIGHTSTENT PLACEMENT;  Surgeon: Bjorn PippinWrenn, John, MD;  Location[facility];  Service: Urology;  Laterality: N/A;  . EXTRACORPOREAL SHOCK WAVE LITHOTRIPSY Left 07-07-2015  &  12-26-2014  . 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 HOLMIUM LASER APPLICATION Left 07/05/2014   Procedure: LASER LITHO;  Surgeon: Jerilee FieldMatthew Eskridge, MD;  [facility] [place];  Service: Urology;  Laterality: Left;  . HOLMIUM LASER APPLICATION Left 08/08/2015   Procedure: HOLMIUM LASER  WITH LITHOTRIPSY ;  Surgeon: Jerilee FieldMatthew Eskridge, MD;  [facility] [place];  Service: Urology;  Laterality: Left;  . HOLMIUM LASER APPLICATION Left 02/21/2017   Procedure: HOLMIUM LASER APPLICATION;  Surgeon: Bjorn PippinJohn Wrenn, MD;  Location[facility];  Service: Urology;  Laterality: Left;  . HOLMIUM LASER APPLICATION Left 03/13/2018   Procedure: HOLMIUM LASER APPLICATION;  Surgeon: Bjorn PippinWrenn, John, MD;  Location[facility];  Service: Urology;  Laterality: Left;  . LYMPH GLAND EXCISION  2003    neck--  benign  . SVT ABLATION N/A 05/22/2018   Procedure: SVT ABLATION;  Surgeon: Marinus Mawaylor, Gregg W, MD;  [facility];  Service: Cardiovascular;  Laterality: N/A;    Home Medications:    Allergies:  Allergies  Allergen Reactions  . Bactrim [Sulfamethoxazole-Trimethoprim] Nausea And Vomiting  . Codeine Swelling    Specifically cough syrup w/ codeine cause lip swelling  . Adhesive [Tape] Rash    Paper tape ok    Family History  Problem Relation Age of Onset  . Cancer Father   . Kidney Stones Mother   . Cancer Other   . Hypertension Other   . Hyperlipidemia Other   . Stroke Other   . Kidney Stones Brother     Social History:  reports that he quit smoking about 6 years ago. He quit after 2.00 years of use. He has never used smokeless tobacco. He reports current alcohol use. He reports that he does not use drugs.  ROS: A complete review of systems was performed.  All systems are negative except for pertinent findings as noted.  Physical Exam:  Vital signs in last 24 hours: BP 123/87   Pulse (!) 58   Temp 97.7 F (36.5 C) (Oral)   Resp 20   SpO2 95%  Constitutional:  Alert and oriented, No acute distress Cardiovascular: Regular rate  Respiratory: Normal respiratory effort GI: Abdomen is soft, nontender, nondistended, no abdominal masses. No CVAT.  Genitourinary: Normal male phallus, testes are descended bilaterally and non-tender and without masses, scrotum is normal in appearance without lesions or masses, perineum is normal on inspection. Lymphatic: No lymphadenopathy Neurologic: Grossly intact, no focal deficits Psychiatric: Normal mood and affect  Laboratory  Data:  Recent Labs    03/11/21 0829  WBC 7.4  HGB 15.9  HCT 45.1  PLT 211    Recent Labs    03/11/21 0829  NA 134*  K 3.8  CL 98  GLUCOSE 105*  BUN 18  CALCIUM 9.4  CREATININE 1.38*     Results for orders placed or performed during the hospital encounter of 03/11/21 (from the  past 24 hour(s))  Urinalysis, Routine w reflex microscopic Urine, Clean Catch     Status: Abnormal   Collection Time: 03/11/21  8:15 AM  Result Value Ref Range   Color, Urine YELLOW YELLOW   APPearance CLEAR CLEAR   Specific Gravity, Urine 1.024 1.005 - 1.030   pH 5.0 5.0 - 8.0   Glucose, UA NEGATIVE NEGATIVE mg/dL   Hgb urine dipstick NEGATIVE NEGATIVE   Bilirubin Urine NEGATIVE NEGATIVE   Ketones, ur 20 (A) NEGATIVE mg/dL   Protein, ur NEGATIVE NEGATIVE mg/dL   Nitrite NEGATIVE NEGATIVE   Leukocytes,Ua SMALL (A) NEGATIVE   RBC / HPF 0-5 0 - 5 RBC/hpf   WBC, UA 21-50 0 - 5 WBC/hpf   Bacteria, UA RARE (A) NONE SEEN   Mucus PRESENT   Basic metabolic panel     Status: Abnormal   Collection Time: 03/11/21  8:29 AM  Result Value Ref Range   Sodium 134 (L) 135 - 145 mmol/L   Potassium 3.8 3.5 - 5.1 mmol/L   Chloride 98 98 - 111 mmol/L   CO2 25 22 - 32 mmol/L   Glucose, Bld 105 (H) 70 - 99 mg/dL   BUN 18 6 - 20 mg/dL   Creatinine, Ser 4.09 (H) 0.61 - 1.24 mg/dL   Calcium 9.4 8.9 - 81.1 mg/dL   GFR, Estimated >91 >47 mL/min   Anion gap 11 5 - 15  CBC     Status: None   Collection Time: 03/11/21  8:29 AM  Result Value Ref Range   WBC 7.4 4.0 - 10.5 K/uL   RBC 5.07 4.22 - 5.81 MIL/uL   Hemoglobin 15.9 13.0 - 17.0 g/dL   HCT 82.9 56.2 - 13.0 %   MCV 89.0 80.0 - 100.0 fL   MCH 31.4 26.0 - 34.0 pg   MCHC 35.3 30.0 - 36.0 g/dL   RDW 86.5 78.4 - 69.6 %   Platelets 211 150 - 400 K/uL   nRBC 0.0 0.0 - 0.2 %  Resp Panel by RT-PCR (Flu A&B, Covid) Nasopharyngeal Swab     Status: None   Collection Time: 03/11/21  1:43 PM   Specimen: Nasopharyngeal Swab; Nasopharyngeal(NP) swabs in vial transport medium  Result Value Ref Range   SARS Coronavirus 2 by RT PCR NEGATIVE NEGATIVE   Influenza A by PCR NEGATIVE NEGATIVE   Influenza B by PCR NEGATIVE NEGATIVE   Recent Results (from the past 240 hour(s))  Resp Panel by RT-PCR (Flu A&B, Covid) Nasopharyngeal Swab     Status: None    Collection Time: 03/11/21  1:43 PM   Specimen: Nasopharyngeal Swab; Nasopharyngeal(NP) swabs in vial transport medium  Result Value Ref Range Status   SARS Coronavirus 2 by RT PCR NEGATIVE NEGATIVE Final    Comment: (NOTE) SARS-CoV-2 target nucleic acids are NOT DETECTED.  The SARS-CoV-2 RNA is generally detectable in upper respiratory specimens during the acute phase of infection. The lowest concentration of SARS-CoV-2 viral copies this assay can detect is 138 copies/mL. A negative result does not preclude SARS-Cov-2 infection and should not be used as  the sole basis for treatment or other patient management decisions. A negative result may occur with  improper specimen collection/handling, submission of specimen other than nasopharyngeal swab, presence of viral mutation(s) within the areas targeted by this assay, and inadequate number of viral copies(<138 copies/mL). A negative result must be combined with clinical observations, patient history, and epidemiological information. The expected result is Negative.  Fact Sheet for Patients:  BloggerCourse.com  Fact Sheet for Healthcare Providers:  SeriousBroker.it  This test is no t yet approved or cleared by the [place] FDA and  has been authorized for detection and/or diagnosis of SARS-CoV-2 by FDA under an Emergency Use Authorization (EUA). This EUA will remain  in effect (meaning this test can be used) for the duration of the COVID-19 declaration under Section 564(b)(1) of the Act, 21 U.S.C.section 360bbb-3(b)(1), unless the authorization is terminated  or revoked sooner.       Influenza A by PCR NEGATIVE NEGATIVE Final   Influenza B by PCR NEGATIVE NEGATIVE Final    Comment: (NOTE) The Xpert Xpress SARS-CoV-2/FLU/RSV plus assay is intended as an aid in the diagnosis of influenza from Nasopharyngeal swab specimens and should not be used as a sole basis for treatment.  Nasal washings and aspirates are unacceptable for Xpert Xpress SARS-CoV-2/FLU/RSV testing.  Fact Sheet for Patients: BloggerCourse.com  Fact Sheet for Healthcare Providers: SeriousBroker.it  This test is not yet approved or cleared by the [place] FDA and has been authorized for detection and/or diagnosis of SARS-CoV-2 by FDA under an Emergency Use Authorization (EUA). This EUA will remain in effect (meaning this test can be used) for the duration of the COVID-19 declaration under Section 564(b)(1) of the Act, 21 U.S.C. section 360bbb-3(b)(1), unless the authorization is terminated or revoked.  Performed at [facility], [address], [place], [place] [address]     Renal Function: Recent Labs    03/11/21 8127  CREATININE 1.38*   CrCl cannot be calculated (Unknown ideal weight.).  Radiologic Imaging: US Renal  Result Date: 03/11/2021 CLINICAL DATA:  Left-sided flank pain EXAM: RENAL / URINARY TRACT ULTRASOUND COMPLETE COMPARISON:  09/27/2020 FINDINGS: Right Kidney: Renal measurements: 11.1 x 5.4 x 6.9 cm. = volume: 217 mL. No mass lesion or hydronephrosis is noted. Previously seen calculi are not well appreciated. Left Kidney: Renal measurements: 14.0 x 6.8 x 7.9 cm. = volume: 396 mL. Severe hydronephrosis is identified on the left increased from the prior exam. Previously seen calculi are not well appreciated. Bladder: Appears normal for degree of bladder distention. Other: None. IMPRESSION: Increase in left-sided hydronephrosis. CT urogram may be helpful for further evaluation. Previously seen calculi bilaterally are not well appreciated on this exam. Electronically Signed   By: Alcide Clever M.D.   On: 03/11/2021 09:22   CT Renal Stone Study  Result Date: 03/11/2021 CLINICAL DATA:  Left flank pain EXAM: CT ABDOMEN AND PELVIS WITHOUT CONTRAST TECHNIQUE: Multidetector CT imaging of the abdomen and pelvis  was performed following the standard protocol without oral or IV contrast. COMPARISON:  March 29, 2020. FINDINGS: Lower chest: Lung bases are clear. Hepatobiliary: There is diffuse decreased attenuation throughout much of the liver consistent with hepatic steatosis. Areas of relative fatty sparing noted inferiorly in the right lobe. No focal liver lesions evident on this noncontrast enhanced study. Gallbladder wall is not appreciably thickened. There is no biliary duct dilatation. Pancreas: There is no pancreatic mass or inflammatory focus. Spleen: No splenic lesions are evident. Adrenals/Urinary Tract: Adrenals bilaterally appear  normal. There is no appreciable renal mass on either side. There is severe hydronephrosis on the left. There is no hydronephrosis on the right. On the right, there are multiple 1-3 mm calculi in the upper and lower pole regions. On the left, there are scattered 1 mm calculi in the upper pole region. There is a 4 x 3 mm calculus with a 5 x 4 mm calculus in the lower pole left kidney region. There is a calculus in the proximal left ureter at the L4 level measuring 1.3 x 0.7 cm. No other ureteral calculi are evident. Urinary bladder is midline with wall thickness within normal limits. Stomach/Bowel: Note degree of bowel malrotation, stable from previous study. Specifically, cecum is in the midline pelvis with nearly all of colon to the left of midline. There is no appreciable bowel wall or mesenteric thickening. There is no evident bowel obstruction. Terminal ileum appears unremarkable. Appendix appears normal. There is no appreciable free air or portal venous air. Vascular/Lymphatic: No abdominal aortic aneurysm. No vascular lesions evident on noncontrast enhanced study. No adenopathy evident in the abdomen or pelvis. Reproductive: Prostate and seminal vesicles are normal in size and contour. There is minimal prostatic calcification. Other: No abscess or ascites evident in the abdomen or  pelvis. There is slight fat in the umbilicus. Musculoskeletal: No blastic or lytic bone lesions. There is lumbar levoscoliosis. No intramuscular lesions. IMPRESSION: 1. There is a calculus measuring 1.3 x 0.7 cm in the left ureter at the level of L4 with marked hydronephrosis on the left. 2. Multiple calculi in each kidney, nonobstructing, larger and more numerous on the left than on the right. 3. Note that the cecum is in the midline pelvis. No bowel obstruction. No abscess in the abdomen or pelvis. Appendix appears normal. 4.  Hepatic steatosis. Electronically Signed   By: Bretta Bang III M.D.   On: 03/11/2021 11:33    Impression/Assessment:  Left proximal ureteral stone, 7 x 13 mm, with significant hydronephrosis and pain, intractable  Plan:  I have discussed treatment options with the patient-observation with attempted medical expulsive therapy, lithotripsy, ureteroscopy with lithotripsy and stent versus stent with secondary procedure down the road.  His stones have a high Hounsfield unit, and in the past lithotripsy has been unsuccessful.  I would suggest we will proceed with cystoscopy, retrograde, ureteroscopy, laser and extraction.  He agrees.  I discussed the procedure as well as risks and complications with him.  He is well aware of these and desires to proceed.

## 2021-03-11 NOTE — ED Provider Notes (Signed)
Care transferred from previous provider see note for full HPI.   In summation, 30 year old presents for evaluation of flank pain. Has 1.3cm left ureteral stone with Hydro. Pain uncontrolled. Persistent emesis. Urology consulted. Dr. Janifer Adie plans to see in ED and assess. PRN pain orders placed per previous provider. Physical Exam  BP 131/88   Pulse 80   Temp 97.7 F (36.5 C) (Oral)   Resp 20   SpO2 99%   Physical Exam  ED Course/Procedures     Procedures Labs Reviewed  URINALYSIS, ROUTINE W REFLEX MICROSCOPIC - Abnormal; Notable for the following components:      Result Value   Ketones, ur 20 (*)    Leukocytes,Ua SMALL (*)    Bacteria, UA RARE (*)    All other components within normal limits  BASIC METABOLIC PANEL - Abnormal; Notable for the following components:   Sodium 134 (*)    Glucose, Bld 105 (*)    Creatinine, Ser 1.38 (*)    All other components within normal limits  RESP PANEL BY RT-PCR (FLU A&B, COVID) [id number]  URINE CULTURE  CBC  US Renal  Result Date: 03/11/2021 CLINICAL DATA:  Left-sided flank pain EXAM: RENAL / URINARY TRACT ULTRASOUND COMPLETE COMPARISON:  09/27/2020 FINDINGS: Right Kidney: Renal measurements: 11.1 x 5.4 x 6.9 cm. = volume: 217 mL. No mass lesion or hydronephrosis is noted. Previously seen calculi are not well appreciated. Left Kidney: Renal measurements: 14.0 x 6.8 x 7.9 cm. = volume: 396 mL. Severe hydronephrosis is identified on the left increased from the prior exam. Previously seen calculi are not well appreciated. Bladder: Appears normal for degree of bladder distention. Other: None. IMPRESSION: Increase in left-sided hydronephrosis. CT urogram may be helpful for further evaluation. Previously seen calculi bilaterally are not well appreciated on this exam. Electronically Signed   By: Alcide Clever M.D.   On: 03/11/2021 09:22   CT Renal Stone Study  Result Date: 03/11/2021 CLINICAL DATA:  Left flank pain EXAM: CT ABDOMEN AND PELVIS WITHOUT  CONTRAST TECHNIQUE: Multidetector CT imaging of the abdomen and pelvis was performed following the standard protocol without oral or IV contrast. COMPARISON:  March 29, 2020. FINDINGS: Lower chest: Lung bases are clear. Hepatobiliary: There is diffuse decreased attenuation throughout much of the liver consistent with hepatic steatosis. Areas of relative fatty sparing noted inferiorly in the right lobe. No focal liver lesions evident on this noncontrast enhanced study. Gallbladder wall is not appreciably thickened. There is no biliary duct dilatation. Pancreas: There is no pancreatic mass or inflammatory focus. Spleen: No splenic lesions are evident. Adrenals/Urinary Tract: Adrenals bilaterally appear normal. There is no appreciable renal mass on either side. There is severe hydronephrosis on the left. There is no hydronephrosis on the right. On the right, there are multiple 1-3 mm calculi in the upper and lower pole regions. On the left, there are scattered 1 mm calculi in the upper pole region. There is a 4 x 3 mm calculus with a 5 x 4 mm calculus in the lower pole left kidney region. There is a calculus in the proximal left ureter at the L4 level measuring 1.3 x 0.7 cm. No other ureteral calculi are evident. Urinary bladder is midline with wall thickness within normal limits. Stomach/Bowel: Note degree of bowel malrotation, stable from previous study. Specifically, cecum is in the midline pelvis with nearly all of colon to the left of midline. There is no appreciable bowel wall or mesenteric thickening. There is no evident bowel  obstruction. Terminal ileum appears unremarkable. Appendix appears normal. There is no appreciable free air or portal venous air. Vascular/Lymphatic: No abdominal aortic aneurysm. No vascular lesions evident on noncontrast enhanced study. No adenopathy evident in the abdomen or pelvis. Reproductive: Prostate and seminal vesicles are normal in size and contour. There is minimal prostatic  calcification. Other: No abscess or ascites evident in the abdomen or pelvis. There is slight fat in the umbilicus. Musculoskeletal: No blastic or lytic bone lesions. There is lumbar levoscoliosis. No intramuscular lesions. IMPRESSION: 1. There is a calculus measuring 1.3 x 0.7 cm in the left ureter at the level of L4 with marked hydronephrosis on the left. 2. Multiple calculi in each kidney, nonobstructing, larger and more numerous on the left than on the right. 3. Note that the cecum is in the midline pelvis. No bowel obstruction. No abscess in the abdomen or pelvis. Appendix appears normal. 4.  Hepatic steatosis. Electronically Signed   By: Bretta Bang III M.D.   On: 03/11/2021 11:33   MDM  Dr. Janifer Adie to see in consult. Intractable pain with emesis for 1.3 cm ureteral stone. Follow up on Urology recommendations.  Dr. Janifer Adie to admit.       Henderly, Britni A, PA-C 03/11/21 1529    Linwood Dibbles, MD 03/12/21 [id number]

## 2021-03-11 NOTE — Discharge Instructions (Signed)

## 2021-03-11 NOTE — Transfer of Care (Signed)
Immediate Anesthesia Transfer of Care Note  Patient: Thomas Howell  Procedure(s) Performed: CYSTOSCOPY WITH RETROGRADE PYELOGRAM, URETEROSCOPY AND STENT PLACEMENT,STONE EXTRACTION,HOLMIUM LASER (Left Urethra)  Patient Location: PACU  Anesthesia Type:General  Level of Consciousness: awake and alert   Airway & Oxygen Therapy: Patient Spontanous Breathing and Patient connected to face mask oxygen  Post-op Assessment: Report given to RN and Post -op Vital signs reviewed and stable  Post vital signs: Reviewed and stable  Last Vitals:  Vitals Value Taken Time  BP 142/93 03/11/21 2105  Temp    Pulse 105 03/11/21 2106  Resp 14 03/11/21 2106  SpO2 100 % 03/11/21 2106  Vitals shown include unvalidated device data.  Last Pain:  Vitals:   03/11/21 1908  TempSrc:   PainSc: 4       Patients Stated Pain Goal: 4 (03/11/21 1858)  Complications: No complications documented.

## 2021-03-11 NOTE — Op Note (Signed)
Preoperative diagnosis: Large left upper ureteral calculus  Postoperative diagnosis: Same  Principal procedure: Cystoscopy, left retrograde ureteropyelogram, fluoroscopic interpretation, left ureteroscopy, holmium laser lithotripsy of left ureteral stone, placement of 6 French by 26 cm contour double-J stent without tether  Surgeon: Dahlstedt  Anesthesia: General with LMA  Complications: None  Specimen: None  Estimated blood loss: None  Indications: 30 year old male with recurrent urolithiasis.  He states that he is passed over 100 stones.  Additionally, he has had multiple interventions for stones, mostly ureteroscopic management.  His last stone procedure was in 2021 by Dr. Mena Goes who he follows with fairly regularly.  The patient had recent onset of significant left flank pain.  Presentation to the emergency room here revealed a 14 x 7 to 8 mm left upper ureteral stone.  He had intractable pain, resistant to aggressive pain management in the emergency room.  He presents at this time for ureteroscopic management.  I discussed the procedure as well as expected outcomes, risks and complications.  These include but are not limited to infection, blood in urine, anesthetic complications, ureteral stricture, ureteral injury, need for ureteral stent, among others.  He understands these and desires to proceed.  Findings: The patient had a urethra was normal, prostate nonobstructive.  Circumferential inspection of the bladder revealed normal urothelium, normally placed and configured ureteral orifices.  Retrograde study of the left ureter revealed a normal proximal and mid ureter, with a filling defect in the proximal ureter and significant proximal hydroureteronephrosis with a fairly blown out calyces.  There was a large impacted stone in the proximal ureter.  There was surrounding inflammation.  Description of procedure: The patient was properly identified and marked in the holding area.  He  received Rocephin in the emergency room.  He was taken to the operating room where general anesthetic was administered with the LMA.  He is placed in the dorsolithotomy position.  Genitalia and perineum were prepped, draped, proper timeout performed.  21 French panendoscope advanced under direct vision into his bladder.  The left ureteral orifice was cannulated with a 6 [place] open-ended catheter and gentle retrograde ureteropyelogram performed with Omnipaque with the above-mentioned findings.  I then negotiated, eventually, a zip wire past the stone.  This was quite impacted and would not admit a sensor tip guidewire.  Following adequate positioning in the upper pole calyces, the cystoscope was removed, leaving the zip wire intact.  The zip wire was exchanged within the 6 [place] open-ended catheter which had been placed over top of the zip wire once negotiated by the stone.  The sensor tip guidewire was left in, the open-ended catheter removed.  I then sequentially dilated the left mid and proximal ureter first with the obturator and then the entire 12/14 ureteral access catheter.  This was then removed.  I then passed the 6 French dual-lumen semirigid ureteroscope up to the stone.  It was obviously impacted with some surrounding erythema and other reactive change.  I passed a 365 m fiber up to the stone.  Using the [facility] holmium laser, I then treated the stone on the "dust" mode, 0.2 J and 50 Hz.  The power was eventually increased to 0.5 J.  100s of small fragments resulted.  I felt all of these fragments were small enough to pass spontaneously along the stent which would be placed later.  This lithotripsy took a significant amount of time, both because of the density of the stone and the size of the stone.  After adequate  fragmentation, and inspection and there being no large fragments, the scope was passed all the way up to the UPJ.  No further stones were seen.  The ureteroscope was removed.  The guidewire  was backloaded through the cystoscope and using the cystoscopic and fluoroscopic guidance a 6 French by 26 cm contour double-J stent was passed.  I did not leave a tether on, as there was significant inflammatory change where the stone was.  After adequate positioning was seen, the bladder was drained and the scope removed.  The procedure was then terminated.  The patient was then awakened and taken to the PACU in stable condition, having tolerated the procedure well.

## 2021-03-11 NOTE — ED Provider Notes (Signed)
[facility]-EMERGENCY DEPT Provider Note   CSN: [id number] Arrival date & time: 03/11/21  0805     History Chief Complaint  Patient presents with  . Flank Pain    Thomas Howell is a 30 y.o. male with a history of nephrolithiasis with multiple prior interventions, migraines, and ADHD who presents to the emergency department with complaints of left flank pain for the past 3 days.  Patient states the pain is located to the left flank, nonradiating, waxing and waning in severity, feels similar to prior kidney stones.  He is having associated nausea, vomiting, and some dysuria.  No alleviating or aggravating factors.  Denies fever, chills, hematemesis, diarrhea, constipation, testicular pain/swelling, or hematuria.   HPI     Past Medical History:  Diagnosis Date  . Complication of anesthesia   . Frequency-urgency syndrome   . History of kidney stones   . Hx of nausea and vomiting    d/t kidney stone  . Hx of sepsis 08/11/2017   due to kidney stone/ hydronephrosis  . Left ureteral stone   . Migraine   . Pain due to ureteral stent (HCC)   . PONV (postoperative nausea and vomiting)    none recently  . Renal calculi    bilateral per ct 07-26-2016  . Scoliosis of lumbar spine 1994   treated at [facility] until age 33  . Wears glasses     Patient Active Problem List   Diagnosis Date Noted  . Right ureteral stone 10/13/2018  . Acute pyelonephritis 08/14/2018  . Bilateral hydronephrosis 08/14/2018  . Sepsis (HCC) 08/14/2018  . SVT (supraventricular tachycardia) (HCC) 05/22/2018  . Attention deficit hyperactivity disorder (ADHD), combined type 04/08/2018  . Chronic migraine 02/14/2017  . Neck pain 02/04/2016  . Hydronephrosis, left 08/19/2015  . Hydronephrosis determined by ultrasound   . Kidney stone on left side 08/18/2015  . Nephrolithiasis 08/18/2015  . Kidney stone 07/06/2015  . Left ureteral stone 07/06/2015  . Analgesic rebound headache 06/05/2014   . Migraine headache without aura 05/30/2014    Past Surgical History:  Procedure Laterality Date  . CYSTOSCOPY W/ RETROGRADES Right 07/06/2015   Procedure: CYSTOSCOPY WITH RETROGRADE PYELOGRAM;  Surgeon: Jerilee Field, MD;  Location: [facility];  Service: Urology;  Laterality: Right;  . CYSTOSCOPY W/ URETERAL STENT PLACEMENT Left 08/08/2015   Procedure: CYSTOSCOPY WITH STENT REPLACEMENT;  Surgeon: Jerilee Field, MD;  Location[facility];  Service: Urology;  Laterality: Left;  . CYSTOSCOPY W/ URETERAL STENT PLACEMENT Left 02/21/2017   Procedure: CYSTOSCOPY WITH RETROGRADE PYELOGRAM/URETERAL LEFT STENT PLACEMENT WITH  LASER;  Surgeon: Bjorn Pippin, MD;  Location: [facility];  Service: Urology;  Laterality: Left;  . CYSTOSCOPY W/ URETERAL STENT PLACEMENT Left 08/10/2017   Procedure: CYSTOSCOPY WITH RETROGRADE PYELOGRAM/URETERAL STENT PLACEMENT;  Surgeon: Heloise Purpura, MD;  Location: [facility];  Service: Urology;  Laterality: Left;  . CYSTOSCOPY WITH RETROGRADE PYELOGRAM, URETEROSCOPY AND STENT PLACEMENT Left 06/24/2014   Procedure: CYSTOSCOPY WITH RETROGRADE PYELOGRAM,  AND STENT PLACEMENT;  Surgeon: Magdalene Molly, MD;  Location: [facility];  Service: Urology;  Laterality: Left;  . CYSTOSCOPY WITH RETROGRADE PYELOGRAM, URETEROSCOPY AND STENT PLACEMENT Left 07/05/2014   Procedure: CYSTO/LEFT URETEROSCOPY/LEFT RETROGRADE PYELOGRAM/LEFT STENT PLACEMENT;  Surgeon: Jerilee Field, MD;  Location: [facility];  Service: Urology;  Laterality: Left;  . CYSTOSCOPY WITH RETROGRADE PYELOGRAM, URETEROSCOPY AND STENT PLACEMENT Left 07/06/2015   Procedure: CYSTOSCOPY WITH RETROGRADE PYELOGRAM, URETEROSCOPY , LASER, STENT PLACEMENT and BASKET EXTRACTION;  Surgeon: Molli Hazard  EskriMena GoesMD;  Location: [facility];  Service: Urology;  Laterality: Left;  . CYSTOSCOPY WITH RETROGRADE PYELOGRAM, URETEROSCOPY AND STENT PLACEMENT Left 08/19/2015   Procedure: CYSTOSCOPY WITH RETROGRADE PYELOGRAM, URETEROSCOPY  AND STENT PLACEMENT;  Surgeon: Jerilee Field, MD;  Location: [facility];  Service: Urology;  Laterality: Left;  . CYSTOSCOPY WITH RETROGRADE PYELOGRAM, URETEROSCOPY AND STENT PLACEMENT Left 03/13/2018   Procedure: CYSTOSCOPY WITH RETROGRADE PYELOGRAM, URETEROSCOPY AND LEFT STENT PLACEMENT;  Surgeon: Bjorn Pippin, MD;  Location: [facility];  Service: Urology;  Laterality: Left;  . CYSTOSCOPY WITH URETEROSCOPY AND STENT PLACEMENT Left 01/16/2016   Procedure: CYSTOSCOPY WITH LEFT RETROGRADE PYELOGRAM  LEFT DIGITAL URETEROSCOPY AND PLACEMENT LEFT URETERAL STENT;  Surgeon: Jerilee Field, MD;  Location: [facility];  Service: Urology;  Laterality: Left;  . CYSTOSCOPY WITH URETEROSCOPY, STONE BASKETRY AND STENT PLACEMENT Left 02/13/2016   Procedure: CYSTOSCOPY WITH LEFT URETEROSCOPY, HOLMIUM LASER AND STENT PLACEMENT;  Surgeon: Jerilee Field, MD;  Location: [facility];  Service: Urology;  Laterality: Left;  . CYSTOSCOPY/RETROGRADE/URETEROSCOPY/STONE EXTRACTION WITH BASKET Left 08/08/2015   Procedure: CYSTOSCOPY/RETROGRADE/URETEROSCOPY/STONE EXTRACTION WITH BASKET;  Surgeon: Jerilee Field, MD;  Location: [facility];  Service: Urology;  Laterality: Left;  . CYSTOSCOPY/URETEROSCOPY/HOLMIUM LASER/STENT PLACEMENT Left 07/30/2016   Procedure: CYSTOSCOPY/URETEROSCOPY/HOLMIUM LASER/STENT PLACEMENT;  Surgeon: Jerilee Field, MD;  Location: [facility];  Service: Urology;  Laterality: Left;  . CYSTOSCOPY/URETEROSCOPY/HOLMIUM LASER/STENT PLACEMENT Left 08/19/2017   Procedure: CYSTOSCOPY/URETEROSCOPY/HOLMIUM LASER/STENT PLACEMENT;  Surgeon: Jerilee Field, MD;  Location: [facility];  Service: Urology;  Laterality: Left;  . CYSTOSCOPY/URETEROSCOPY/HOLMIUM LASER/STENT PLACEMENT N/A 10/13/2018   Procedure: CYSTOSCOPY/RETROGRADE RIGHT URETEROSCOPY/ AND RIGHTSTENT PLACEMENT;  Surgeon: Bjorn Pippin, MD;  Location: [facility];  Service: Urology;  Laterality: N/A;  . EXTRACORPOREAL SHOCK WAVE  LITHOTRIPSY Left 07-07-2015  &  12-26-2014  . 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 HOLMIUM LASER APPLICATION Left 07/05/2014   Procedure: LASER LITHO;  Surgeon: Jerilee Field, MD;  Location: [facility];  Service: Urology;  Laterality: Left;  . HOLMIUM LASER APPLICATION Left 08/08/2015   Procedure: HOLMIUM LASER  WITH LITHOTRIPSY ;  Surgeon: Jerilee Field, MD;  Location: [facility];  Service: Urology;  Laterality: Left;  . HOLMIUM LASER APPLICATION Left 02/21/2017   Procedure: HOLMIUM LASER APPLICATION;  Surgeon: Bjorn Pippin, MD;  Location: [facility];  Service: Urology;  Laterality: Left;  . HOLMIUM LASER APPLICATION Left 03/13/2018   Procedure: HOLMIUM LASER APPLICATION;  Surgeon: Bjorn Pippin, MD;  Location: [facility];  Service: Urology;  Laterality: Left;  . LYMPH GLAND EXCISION  2003   neck--  benign  . SVT ABLATION N/A 05/22/2018   Procedure: SVT ABLATION;  Surgeon: Marinus Maw, MD;  Location: [facility];  Service: Cardiovascular;  Laterality: N/A;       Family History  Problem Relation Age of Onset  . Cancer Father   . Kidney Stones Mother   . Cancer Other   . Hypertension Other   . Hyperlipidemia Other   . Stroke Other   . Kidney Stones Brother     Social History   Tobacco Use  . Smoking status: Former Smoker    Years: 2.00    Quit date: 07/29/2014    Years since quitting: 6.6  . Smokeless tobacco: Never Used  . Tobacco comment: social smoker  Vaping Use  . Vaping Use: Never used  Substance Use Topics  . Alcohol use: Yes  Alcohol/week: 0.0 standard drinks    Comment: occ  . Drug use: No    Home Medications Prior to Admission medications   Medication Sig Start Date End Date Taking? Authorizing Provider  acetaminophen (TYLENOL) 500 MG tablet Take 1,000 mg by mouth every 6 (six) hours as needed for moderate pain.    [provider]   amphetamine-dextroamphetamine (ADDERALL XR) 20 MG 24 hr capsule Take 1 capsule (20 mg total) by mouth every morning. 06/02/20   Ladona Ridgel, Malena M, DO  butalbital-acetaminophen-caffeine (FIORICET) 50-325-40 MG tablet TAKE 1 TABLET BY MOUTH EVERY 6 HOURS AS NEEDED FOR HEADACHE 05/15/20   Lomax, Amy, NP  Erenumab-aooe (AIMOVIG) 140 MG/ML SOAJ Inject 140 mg into the skin every 28 (twenty-eight) days. 07/24/20   Lomax, Amy, NP  gabapentin (NEURONTIN) 300 MG capsule Take 1 capsule (300 mg total) by mouth 2 (two) times daily. 02/28/20   Lomax, Amy, NP  hydrochlorothiazide (HYDRODIURIL) 25 MG tablet Take 25 mg by mouth daily.     [provider]  HYDROcodone-acetaminophen (NORCO/VICODIN) 5-325 MG tablet Take 1 tablet by mouth every 4 (four) hours as needed. 09/27/20   Jeannie Fend, PA-C  lamoTRIgine (LAMICTAL) 100 MG tablet TAKE 1 TABLET(100 MG) BY MOUTH TWICE DAILY 10/29/19   Lomax, Amy, NP  levETIRAcetam (KEPPRA) 750 MG tablet Take 1-2 tablets (750-1,500 mg total) by mouth See admin instructions. TAKE 1 TABLET BY MOUTH EVERY MORNING AND 2 TABLETS EVERY NIGHT AT BEDTIME 03/13/20   Lomax, Amy, NP  ondansetron (ZOFRAN ODT) 4 MG disintegrating tablet Take 1 tablet (4 mg total) by mouth every 8 (eight) hours as needed for nausea or vomiting. 09/27/20   Jeannie Fend, PA-C  ondansetron (ZOFRAN) 4 MG tablet Take 1 tablet (4 mg total) by mouth every 6 (six) hours. 03/29/20   Arlyn Dunning, PA-C  propranolol (INDERAL) 20 MG tablet Take 1 tablet (20 mg total) by mouth 2 (two) times daily. 07/07/20   Lomax, Amy, NP  rizatriptan (MAXALT-MLT) 10 MG disintegrating tablet TAKE 1 TABLET BY MOUTH AS NEEDED MAY REPEAT IN 2 HOURS IF NEEDED 07/10/20   Lomax, Amy, NP  tiZANidine (ZANAFLEX) 4 MG tablet TAKE 1 TABLET(4 MG) BY MOUTH EVERY 6 HOURS AS NEEDED FOR BACK PAIN 02/26/20   Merlyn Albert, MD    Allergies    Bactrim [sulfamethoxazole-trimethoprim], Codeine, and Adhesive [tape]  Review of Systems   Review of  Systems  Constitutional: Negative for chills and fever.  Respiratory: Negative for shortness of breath.   Cardiovascular: Negative for chest pain.  Gastrointestinal: Positive for nausea and vomiting. Negative for blood in stool, constipation and diarrhea.  Genitourinary: Positive for dysuria and flank pain. Negative for hematuria, scrotal swelling and testicular pain.  All other systems reviewed and are negative.   Physical Exam Updated Vital Signs BP (!) 134/93   Pulse 91   Temp 97.7 F (36.5 C) (Oral)   Resp 18   SpO2 97%   Physical Exam Vitals and nursing note reviewed.  Constitutional:      General: He is not in acute distress.    Appearance: He is well-developed. He is not toxic-appearing.  HENT:     Head: Normocephalic and atraumatic.  Eyes:     General:        Right eye: No discharge.        Left eye: No discharge.     Conjunctiva/sclera: Conjunctivae normal.  Cardiovascular:     Rate and Rhythm: Regular rhythm. Tachycardia present.  Pulmonary:     Effort: Pulmonary effort is normal. No respiratory distress.     Breath sounds: Normal breath sounds. No wheezing, rhonchi or rales.  Abdominal:     General: There is no distension.     Palpations: Abdomen is soft.     Tenderness: There is no abdominal tenderness. There is left CVA tenderness. There is no right CVA tenderness, guarding or rebound.  Musculoskeletal:     Cervical back: Neck supple.  Skin:    General: Skin is warm and dry.     Findings: No rash.  Neurological:     Mental Status: He is alert.     Comments: Clear speech.   Psychiatric:        Behavior: Behavior normal.     ED Results / Procedures / Treatments   Labs (all labs ordered are listed, but only abnormal results are displayed) Labs Reviewed  URINALYSIS, ROUTINE W REFLEX MICROSCOPIC - Abnormal; Notable for the following components:      Result Value   Ketones, ur 20 (*)    Leukocytes,Ua SMALL (*)    Bacteria, UA RARE (*)    All other  components within normal limits  BASIC METABOLIC PANEL - Abnormal; Notable for the following components:   Sodium 134 (*)    Glucose, Bld 105 (*)    Creatinine, Ser 1.38 (*)    All other components within normal limits  RESP PANEL BY RT-PCR (FLU A&B, COVID) [id number]  URINE CULTURE  CBC    EKG None  Radiology US Renal  Result Date: 03/11/2021 CLINICAL DATA:  Left-sided flank pain EXAM: RENAL / URINARY TRACT ULTRASOUND COMPLETE COMPARISON:  09/27/2020 FINDINGS: Right Kidney: Renal measurements: 11.1 x 5.4 x 6.9 cm. = volume: 217 mL. No mass lesion or hydronephrosis is noted. Previously seen calculi are not well appreciated. Left Kidney: Renal measurements: 14.0 x 6.8 x 7.9 cm. = volume: 396 mL. Severe hydronephrosis is identified on the left increased from the prior exam. Previously seen calculi are not well appreciated. Bladder: Appears normal for degree of bladder distention. Other: None. IMPRESSION: Increase in left-sided hydronephrosis. CT urogram may be helpful for further evaluation. Previously seen calculi bilaterally are not well appreciated on this exam. Electronically Signed   By: Alcide Clever M.D.   On: 03/11/2021 09:22   CT Renal Stone Study  Result Date: 03/11/2021 CLINICAL DATA:  Left flank pain EXAM: CT ABDOMEN AND PELVIS WITHOUT CONTRAST TECHNIQUE: Multidetector CT imaging of the abdomen and pelvis was performed following the standard protocol without oral or IV contrast. COMPARISON:  March 29, 2020. FINDINGS: Lower chest: Lung bases are clear. Hepatobiliary: There is diffuse decreased attenuation throughout much of the liver consistent with hepatic steatosis. Areas of relative fatty sparing noted inferiorly in the right lobe. No focal liver lesions evident on this noncontrast enhanced study. Gallbladder wall is not appreciably thickened. There is no biliary duct dilatation. Pancreas: There is no pancreatic mass or inflammatory focus. Spleen: No splenic lesions are evident.  Adrenals/Urinary Tract: Adrenals bilaterally appear normal. There is no appreciable renal mass on either side. There is severe hydronephrosis on the left. There is no hydronephrosis on the right. On the right, there are multiple 1-3 mm calculi in the upper and lower pole regions. On the left, there are scattered 1 mm calculi in the upper pole region. There is a 4 x 3 mm calculus with a 5 x 4 mm calculus in the lower pole left kidney region. There is a calculus  in the proximal left ureter at the L4 level measuring 1.3 x 0.7 cm. No other ureteral calculi are evident. Urinary bladder is midline with wall thickness within normal limits. Stomach/Bowel: Note degree of bowel malrotation, stable from previous study. Specifically, cecum is in the midline pelvis with nearly all of colon to the left of midline. There is no appreciable bowel wall or mesenteric thickening. There is no evident bowel obstruction. Terminal ileum appears unremarkable. Appendix appears normal. There is no appreciable free air or portal venous air. Vascular/Lymphatic: No abdominal aortic aneurysm. No vascular lesions evident on noncontrast enhanced study. No adenopathy evident in the abdomen or pelvis. Reproductive: Prostate and seminal vesicles are normal in size and contour. There is minimal prostatic calcification. Other: No abscess or ascites evident in the abdomen or pelvis. There is slight fat in the umbilicus. Musculoskeletal: No blastic or lytic bone lesions. There is lumbar levoscoliosis. No intramuscular lesions. IMPRESSION: 1. There is a calculus measuring 1.3 x 0.7 cm in the left ureter at the level of L4 with marked hydronephrosis on the left. 2. Multiple calculi in each kidney, nonobstructing, larger and more numerous on the left than on the right. 3. Note that the cecum is in the midline pelvis. No bowel obstruction. No abscess in the abdomen or pelvis. Appendix appears normal. 4.  Hepatic steatosis. Electronically Signed   By: Bretta Bang III M.D.   On: 03/11/2021 11:33    Procedures Procedures   Medications Ordered in ED Medications  sodium chloride 0.9 % bolus 1,000 mL (1,000 mLs Intravenous New Bag/Given 03/11/21 0847)  ondansetron (ZOFRAN) injection 4 mg (4 mg Intravenous Given 03/11/21 0848)  HYDROmorphone (DILAUDID) injection 1 mg (1 mg Intravenous Given 03/11/21 0849)  HYDROmorphone (DILAUDID) injection 1 mg (1 mg Intravenous Given 03/11/21 1245)    ED Course  I have reviewed the triage vital signs and the nursing notes.  Pertinent labs & imaging results that were available during my care of the patient were reviewed by me and considered in my medical decision making (see chart for details).    MDM Rules/Calculators/A&P                          Patient presents to the ED with complaints of left flank pain for the past few days.  Vitals with mild tachycardia and somewhat elevated blood pressure, low suspicion for hypertensive emergency.  Patient has left CVA tenderness on exam  Additional history obtained:  Additional history obtained from chart review & nursing note review.   Lab Tests:  I Ordered, reviewed, and interpreted labs, which included:  CBC: Unremarkable BMP: Mildly increased creatinine Urinalysis: Small leukocytes and rare bacteria, nitrite negative  Imaging Studies ordered:  I ordered imaging studies which included Renal US, I independently reviewed, formal radiology impression shows:  Increase in left-sided hydronephrosis. CT urogram may be helpful for further evaluation. Previously seen calculi bilaterally are not well appreciated on this exam.   ED Course:  Primary concern is for ureteral colic given patient's extensive history of kidney stones and this feeling similar.  We discussed options of imaging, through shared decision making elected for renal ultrasound.  Plan for Dilaudid, Zofran, and fluids for symptomatic management.  09:15: Patient initially had pain improvement, but is  now experiencing increased pain following ultrasound procedure, will redose Dilaudid.  [place] with findings of hydronephrosis on the left side which would be consistent with suspected symptomatic kidney stone, creatinine mildly elevated, discussed  low dose toradol with attending Dr. Charm BargesButler who is in agreement with administration, will try this for continued pain control.   10:40: Patient had initially had  pain improvement S/p analgesics, attempted PO challenge with subsequent emesis and increase in pain, additional analgesics & antiemetics ordered. Plan to proceed w/ CT for further assessment.   CT renal stone study: 1. There is a calculus measuring 1.3 x 0.7 cm in the left ureter at the level of L4 with marked hydronephrosis on the left. 2. Multiple calculi in each kidney, nonobstructing, larger and more numerous on the left than on the right. 3. Note that the cecum is in the midline pelvis. No bowel obstruction. No abscess in the abdomen or pelvis. Appendix appears normal. 4.  Hepatic steatosis  Patient remains uncomfortable, large stone, will discuss w/ urology.   13:02: CONSULT: Discussed with urologist Dr. Retta Dionesahlstedt- will come to the ED for patient evaluation this afternoon, needs to see a few patients in clinic, recommends continue managing w/ narcotics at this time, appreciate consultation.   15:00: RE-EVAL: Pain eased off a little then returned, PRN analgesics ordered while pending urology assessment, has a negative COVID test, with UA findings will also dose with Rocephin for precautionary purposes.   Findings and plan of care discussed with supervising physician Dr. Charm BargesButler who is in agreement.   15:10: Patient care signed out to PA Henderly @ change of shift pending urology evaluation & disposition.   Portions of this note were generated with Scientist, clinical (histocompatibility and immunogenetics)Dragon dictation software. Dictation errors may occur despite best attempts at proofreading.  Final Clinical Impression(s) / ED Diagnoses Final  diagnoses:  Kidney stone    Rx / DC Orders ED Discharge Orders    None       Desmond Lopeetrucelli, Samantha R, PA-C 03/11/21 1514    Terrilee FilesButler, Michael C, MD 03/11/21 1758

## 2021-03-12 ENCOUNTER — Encounter (HOSPITAL_COMMUNITY): Payer: Self-pay | Admitting: Urology

## 2021-03-12 LAB — URINE CULTURE: Culture: NO GROWTH

## 2021-03-25 DIAGNOSIS — N200 Calculus of kidney: Secondary | ICD-10-CM | POA: Diagnosis not present

## 2021-03-25 DIAGNOSIS — R8271 Bacteriuria: Secondary | ICD-10-CM | POA: Diagnosis not present

## 2021-04-03 ENCOUNTER — Emergency Department (HOSPITAL_COMMUNITY)
Admission: EM | Admit: 2021-04-03 | Discharge: 2021-04-03 | Disposition: A | Discharge status: Home / Self Care | Payer: BC Managed Care – PPO | Attending: Emergency Medicine | Admitting: Emergency Medicine

## 2021-04-03 ENCOUNTER — Emergency Department (HOSPITAL_COMMUNITY): Payer: BC Managed Care – PPO

## 2021-04-03 ENCOUNTER — Encounter (HOSPITAL_COMMUNITY): Payer: Self-pay

## 2021-04-03 DIAGNOSIS — R1032 Left lower quadrant pain: Secondary | ICD-10-CM | POA: Diagnosis not present

## 2021-04-03 DIAGNOSIS — Z87891 Personal history of nicotine dependence: Secondary | ICD-10-CM | POA: Insufficient documentation

## 2021-04-03 DIAGNOSIS — I10 Essential (primary) hypertension: Secondary | ICD-10-CM | POA: Diagnosis not present

## 2021-04-03 DIAGNOSIS — R112 Nausea with vomiting, unspecified: Secondary | ICD-10-CM | POA: Diagnosis not present

## 2021-04-03 DIAGNOSIS — Z87442 Personal history of urinary calculi: Secondary | ICD-10-CM | POA: Diagnosis not present

## 2021-04-03 DIAGNOSIS — R109 Unspecified abdominal pain: Secondary | ICD-10-CM | POA: Diagnosis not present

## 2021-04-03 DIAGNOSIS — R1012 Left upper quadrant pain: Secondary | ICD-10-CM | POA: Diagnosis not present

## 2021-04-03 DIAGNOSIS — N1330 Unspecified hydronephrosis: Secondary | ICD-10-CM | POA: Diagnosis not present

## 2021-04-03 LAB — BASIC METABOLIC PANEL
Anion gap: 10 (ref 5–15)
BUN: 22 mg/dL — ABNORMAL HIGH (ref 6–20)
CO2: 24 mmol/L (ref 22–32)
Calcium: 9.4 mg/dL (ref 8.9–10.3)
Chloride: 105 mmol/L (ref 98–111)
Creatinine, Ser: 1.29 mg/dL — ABNORMAL HIGH (ref 0.61–1.24)
GFR, Estimated: >60 mL/min (ref >60)
Glucose, Bld: 102 mg/dL — ABNORMAL HIGH (ref 70–99)
Potassium: 3.7 mmol/L (ref 3.5–5.1)
Sodium: 139 mmol/L (ref 135–145)

## 2021-04-03 LAB — CBC
HCT: 41.6 % (ref 39.0–52.0)
Hemoglobin: 14.3 g/dL (ref 13.0–17.0)
MCH: 30.6 pg (ref 26.0–34.0)
MCHC: 34.4 g/dL (ref 30.0–36.0)
MCV: 89.1 fL (ref 80.0–100.0)
Platelets: 237 10*3/uL (ref 150–400)
RBC: 4.67 MIL/uL (ref 4.22–5.81)
RDW: 11.9 % (ref 11.5–15.5)
WBC: 9.9 10*3/uL (ref 4.0–10.5)
nRBC: 0 % (ref 0.0–0.2)

## 2021-04-03 LAB — URINALYSIS, ROUTINE W REFLEX MICROSCOPIC
Bilirubin Urine: NEGATIVE
Glucose, UA: NEGATIVE mg/dL
Hgb urine dipstick: NEGATIVE
Ketones, ur: NEGATIVE mg/dL
Leukocytes,Ua: NEGATIVE
Nitrite: NEGATIVE
Protein, ur: NEGATIVE mg/dL
Specific Gravity, Urine: 1.018 (ref 1.005–1.030)
pH: 7 (ref 5.0–8.0)

## 2021-04-03 MED ORDER — SODIUM CHLORIDE 0.9 % IV BOLUS
1000.0000 mL | Freq: Once | INTRAVENOUS | Status: AC
Start: 2021-04-03 — End: 2021-04-03
  Administered 2021-04-03: 1000 mL via INTRAVENOUS

## 2021-04-03 MED ORDER — KETAMINE HCL 50 MG/5ML IJ SOSY
0.3000 mg/kg | PREFILLED_SYRINGE | Freq: Once | INTRAMUSCULAR | Status: AC
  Administered 2021-04-03: 33 mg via INTRAVENOUS
  Filled 2021-04-03: qty 5

## 2021-04-03 MED ORDER — SODIUM CHLORIDE 0.9 % IV SOLN
12.5000 mg | Freq: Four times a day (QID) | INTRAVENOUS | Status: DC | PRN
  Administered 2021-04-03: 12.5 mg via INTRAVENOUS
  Filled 2021-04-03: qty 0.5

## 2021-04-03 MED ORDER — KETOROLAC TROMETHAMINE 30 MG/ML IJ SOLN
30.0000 mg | Freq: Once | INTRAMUSCULAR | Status: AC
  Administered 2021-04-03: 30 mg via INTRAVENOUS
  Filled 2021-04-03: qty 1

## 2021-04-03 MED ORDER — HYDROMORPHONE HCL 1 MG/ML IJ SOLN
1.0000 mg | Freq: Once | INTRAMUSCULAR | Status: AC
Start: 2021-04-03 — End: 2021-04-03
  Administered 2021-04-03: 1 mg via INTRAVENOUS
  Filled 2021-04-03: qty 1

## 2021-04-03 MED ORDER — ONDANSETRON HCL 4 MG/2ML IJ SOLN
4.0000 mg | Freq: Once | INTRAMUSCULAR | Status: AC
  Administered 2021-04-03: 4 mg via INTRAVENOUS
  Filled 2021-04-03: qty 2

## 2021-04-03 MED ORDER — METOCLOPRAMIDE HCL 5 MG/ML IJ SOLN
10.0000 mg | Freq: Once | INTRAMUSCULAR | Status: AC
  Administered 2021-04-03: 10 mg via INTRAVENOUS
  Filled 2021-04-03: qty 2

## 2021-04-03 MED ORDER — IBUPROFEN 600 MG PO TABS: 600.0000 mg | ORAL_TABLET | Freq: Four times a day (QID) | ORAL | 0 refills | Status: DC | PRN

## 2021-04-03 MED ORDER — TAMSULOSIN HCL 0.4 MG PO CAPS: 0.4000 mg | ORAL_CAPSULE | Freq: Every day | ORAL | 0 refills | Status: DC

## 2021-04-03 NOTE — ED Notes (Signed)
Pt reports he feels Ketamine was overwhelming and did not like the way it made him feel. Requests not to receive again.

## 2021-04-03 NOTE — ED Provider Notes (Signed)
[facility]-EMERGENCY DEPT Provider Note   CSN: [id number] Arrival date & time: 04/03/21  0316     History Chief Complaint  Patient presents with  . Flank Pain    Thomas Howell is a 30 y.o. male with history of kidney stones who presents the emergency department with a chief complaint of left flank pain.  The patient reports sudden onset left flank pain that began suddenly around midnight.  Pain has been constant, and radiating into the lower abdomen.  He reports associated nausea and vomiting.  Pain makes it difficult to get comfortable and feels similar to previous episodes of passing kidney stones.  He denies fever, chills, dysuria, hematuria, diarrhea, constipation, urinary retention.  Patient has recently had cystoscopy with ureteroscopy and stent placement on 3/23.  The history is provided by the patient and medical records. No language interpreter was used.       Past Medical History:  Diagnosis Date  . Complication of anesthesia   . Frequency-urgency syndrome   . History of kidney stones   . Hx of nausea and vomiting    d/t kidney stone  . Hx of sepsis 08/11/2017   due to kidney stone/ hydronephrosis  . Left ureteral stone   . Migraine   . Pain due to ureteral stent (HCC)   . PONV (postoperative nausea and vomiting)    none recently  . Renal calculi    bilateral per ct 07-26-2016  . Scoliosis of lumbar spine 1994   treated at [facility] until age 55  . Wears glasses     Patient Active Problem List   Diagnosis Date Noted  . Right ureteral stone 10/13/2018  . Acute pyelonephritis 08/14/2018  . Bilateral hydronephrosis 08/14/2018  . Sepsis (HCC) 08/14/2018  . SVT (supraventricular tachycardia) (HCC) 05/22/2018  . Attention deficit hyperactivity disorder (ADHD), combined type 04/08/2018  . Chronic migraine 02/14/2017  . Neck pain 02/04/2016  . Hydronephrosis, left 08/19/2015  . Hydronephrosis determined by ultrasound   . Kidney stone on  left side 08/18/2015  . Nephrolithiasis 08/18/2015  . Kidney stone 07/06/2015  . Left ureteral stone 07/06/2015  . Analgesic rebound headache 06/05/2014  . Migraine headache without aura 05/30/2014    Past Surgical History:  Procedure Laterality Date  . CYSTOSCOPY W/ RETROGRADES Right 07/06/2015   Procedure: CYSTOSCOPY WITH RETROGRADE PYELOGRAM;  Surgeon: Jerilee Field, MD;  Location: [facility];  Service: Urology;  Laterality: Right;  . CYSTOSCOPY W/ URETERAL STENT PLACEMENT Left 08/08/2015   Procedure: CYSTOSCOPY WITH STENT REPLACEMENT;  Surgeon: Jerilee Field, MD;  Location: [facility];  Service: Urology;  Laterality: Left;  . CYSTOSCOPY W/ URETERAL STENT PLACEMENT Left 02/21/2017   Procedure: CYSTOSCOPY WITH RETROGRADE PYELOGRAM/URETERAL LEFT STENT PLACEMENT WITH  LASER;  Surgeon: Bjorn Pippin, MD;  Location: [facility];  Service: Urology;  Laterality: Left;  . CYSTOSCOPY W/ URETERAL STENT PLACEMENT Left 08/10/2017   Procedure: CYSTOSCOPY WITH RETROGRADE PYELOGRAM/URETERAL STENT PLACEMENT;  Surgeon: Heloise Purpura, MD;  Location: [facility];  Service: Urology;  Laterality: Left;  . CYSTOSCOPY WITH RETROGRADE PYELOGRAM, URETEROSCOPY AND STENT PLACEMENT Left 06/24/2014   Procedure: CYSTOSCOPY WITH RETROGRADE PYELOGRAM,  AND STENT PLACEMENT;  Surgeon: Magdalene Molly, MD;  Location: [facility];  Service: Urology;  Laterality: Left;  . CYSTOSCOPY WITH RETROGRADE PYELOGRAM, URETEROSCOPY AND STENT PLACEMENT Left 07/05/2014   Procedure: CYSTO/LEFT URETEROSCOPY/LEFT RETROGRADE PYELOGRAM/LEFT STENT PLACEMENT;  Surgeon: Jerilee Field, MD;  Location: [facility];  Service: Urology;  Laterality: Left;  .  CYSTOSCOPY WITH RETROGRADE PYELOGRAM, URETEROSCOPY AND STENT PLACEMENT Left 07/06/2015   Procedure: CYSTOSCOPY WITH RETROGRADE PYELOGRAM, URETEROSCOPY , LASER, STENT PLACEMENT and BASKET EXTRACTION;  Surgeon: Jerilee Field, MD;  Location: [facility];  Service: Urology;  Laterality: Left;   . CYSTOSCOPY WITH RETROGRADE PYELOGRAM, URETEROSCOPY AND STENT PLACEMENT Left 08/19/2015   Procedure: CYSTOSCOPY WITH RETROGRADE PYELOGRAM, URETEROSCOPY AND STENT PLACEMENT;  Surgeon: Jerilee Field, MD;  Location: [facility];  Service: Urology;  Laterality: Left;  . CYSTOSCOPY WITH RETROGRADE PYELOGRAM, URETEROSCOPY AND STENT PLACEMENT Left 03/13/2018   Procedure: CYSTOSCOPY WITH RETROGRADE PYELOGRAM, URETEROSCOPY AND LEFT STENT PLACEMENT;  Surgeon: Bjorn Pippin, MD;  Location: [facility];  Service: Urology;  Laterality: Left;  . CYSTOSCOPY WITH RETROGRADE PYELOGRAM, URETEROSCOPY AND STENT PLACEMENT Left 03/11/2021   Procedure: CYSTOSCOPY WITH RETROGRADE PYELOGRAM, URETEROSCOPY AND STENT PLACEMENT,STONE EXTRACTION,HOLMIUM LASER;  Surgeon: Marcine Matar, MD;  Location: [facility];  Service: Urology;  Laterality: Left;  . CYSTOSCOPY WITH URETEROSCOPY AND STENT PLACEMENT Left 01/16/2016   Procedure: CYSTOSCOPY WITH LEFT RETROGRADE PYELOGRAM  LEFT DIGITAL URETEROSCOPY AND PLACEMENT LEFT URETERAL STENT;  Surgeon: Jerilee Field, MD;  Location: [facility];  Service: Urology;  Laterality: Left;  . CYSTOSCOPY WITH URETEROSCOPY, STONE BASKETRY AND STENT PLACEMENT Left 02/13/2016   Procedure: CYSTOSCOPY WITH LEFT URETEROSCOPY, HOLMIUM LASER AND STENT PLACEMENT;  Surgeon: Jerilee Field, MD;  Location: [facility];  Service: Urology;  Laterality: Left;  . CYSTOSCOPY/RETROGRADE/URETEROSCOPY/STONE EXTRACTION WITH BASKET Left 08/08/2015   Procedure: CYSTOSCOPY/RETROGRADE/URETEROSCOPY/STONE EXTRACTION WITH BASKET;  Surgeon: Jerilee Field, MD;  Location: [facility];  Service: Urology;  Laterality: Left;  . CYSTOSCOPY/URETEROSCOPY/HOLMIUM LASER/STENT PLACEMENT Left 07/30/2016   Procedure: CYSTOSCOPY/URETEROSCOPY/HOLMIUM LASER/STENT PLACEMENT;  Surgeon: Jerilee Field, MD;  Location: [facility];  Service: Urology;  Laterality: Left;  . CYSTOSCOPY/URETEROSCOPY/HOLMIUM LASER/STENT PLACEMENT Left  08/19/2017   Procedure: CYSTOSCOPY/URETEROSCOPY/HOLMIUM LASER/STENT PLACEMENT;  Surgeon: Jerilee Field, MD;  Location: [facility];  Service: Urology;  Laterality: Left;  . CYSTOSCOPY/URETEROSCOPY/HOLMIUM LASER/STENT PLACEMENT N/A 10/13/2018   Procedure: CYSTOSCOPY/RETROGRADE RIGHT URETEROSCOPY/ AND RIGHTSTENT PLACEMENT;  Surgeon: Bjorn Pippin, MD;  Location: [facility];  Service: Urology;  Laterality: N/A;  . EXTRACORPOREAL SHOCK WAVE LITHOTRIPSY Left 07-07-2015  &  12-26-2014  . 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 HOLMIUM LASER APPLICATION Left 07/05/2014   Procedure: LASER LITHO;  Surgeon: Jerilee Field, MD;  Location: [facility];  Service: Urology;  Laterality: Left;  . HOLMIUM LASER APPLICATION Left 08/08/2015   Procedure: HOLMIUM LASER  WITH LITHOTRIPSY ;  Surgeon: Jerilee Field, MD;  Location[facility];  Service: Urology;  Laterality: Left;  . HOLMIUM LASER APPLICATION Left 02/21/2017   Procedure: HOLMIUM LASER APPLICATION;  Surgeon: Bjorn Pippin, MD;  Location: [facility];  Service: Urology;  Laterality: Left;  . HOLMIUM LASER APPLICATION Left 03/13/2018   Procedure: HOLMIUM LASER APPLICATION;  Surgeon: Bjorn Pippin, MD;  Location: [facility];  Service: Urology;  Laterality: Left;  . LYMPH GLAND EXCISION  2003   neck--  benign  . SVT ABLATION N/A 05/22/2018   Procedure: SVT ABLATION;  Surgeon: Marinus Maw, MD;  Location: [facility];  Service: Cardiovascular;  Laterality: N/A;       Family History  Problem Relation Age of Onset  . Cancer Father   . Kidney Stones Mother   . Cancer Other   . Hypertension Other   . Hyperlipidemia Other   . Stroke Other   . Kidney  Stones Brother     Social History   Tobacco Use  . Smoking status: Former Smoker    Years: 2.00    Quit date: 07/29/2014    Years since quitting: 6.6  . Smokeless tobacco: Never Used  . Tobacco  comment: social smoker  Vaping Use  . Vaping Use: Never used  Substance Use Topics  . Alcohol use: Yes    Alcohol/week: 0.0 standard drinks    Comment: occ  . Drug use: No    Home Medications Prior to Admission medications   Medication Sig Start Date End Date Taking? Authorizing Provider  hydrochlorothiazide (HYDRODIURIL) 25 MG tablet Take 25 mg by mouth daily.    Yes [provider]  acetaminophen (TYLENOL) 500 MG tablet Take 1,000 mg by mouth every 6 (six) hours as needed for moderate pain.    [provider]  butalbital-acetaminophen-caffeine (FIORICET) 50-325-40 MG tablet TAKE 1 TABLET BY MOUTH EVERY 6 HOURS AS NEEDED FOR HEADACHE Patient taking differently: Take 1 tablet by mouth every 6 (six) hours as needed for migraine or headache. 05/15/20   Lomax, Amy, NP  cephALEXin (KEFLEX) 500 MG capsule Take 1 capsule (500 mg total) by mouth 2 (two) times daily. 03/11/21   Marcine Matar, MD  HYDROcodone-acetaminophen (NORCO/VICODIN) 5-325 MG tablet Take 1 tablet by mouth every 4 (four) hours as needed for moderate pain. 03/11/21 03/11/22  Marcine Matar, MD  ondansetron (ZOFRAN ODT) 4 MG disintegrating tablet Take 1 tablet (4 mg total) by mouth every 8 (eight) hours as needed for nausea or vomiting. 09/27/20   Jeannie Fend, PA-C  rizatriptan (MAXALT-MLT) 10 MG disintegrating tablet TAKE 1 TABLET BY MOUTH AS NEEDED MAY REPEAT IN 2 HOURS IF NEEDED Patient taking differently: Take 10 mg by mouth daily as needed for migraine. May repeat in 2 hrs if needed 07/10/20   Lomax, Amy, NP    Allergies    Bactrim [sulfamethoxazole-trimethoprim], Codeine, Ketamine, and Adhesive [tape]  Review of Systems   Review of Systems  Constitutional: Negative for appetite change, chills and fever.  HENT: Negative for congestion and sore throat.   Respiratory: Negative for shortness of breath and wheezing.   Cardiovascular: Negative for chest pain and palpitations.  Gastrointestinal:  Positive for abdominal pain, nausea and vomiting. Negative for constipation and diarrhea.  Genitourinary: Positive for flank pain. Negative for dysuria, frequency, hematuria, penile pain, penile swelling, scrotal swelling and urgency.  Musculoskeletal: Negative for back pain, myalgias, neck pain and neck stiffness.  Skin: Negative for rash.  Allergic/Immunologic: Negative for immunocompromised state.  Neurological: Negative for seizures, syncope, weakness, numbness and headaches.  Psychiatric/Behavioral: Negative for confusion.    Physical Exam Updated Vital Signs BP 123/84   Pulse 87   Temp 98 F (36.7 C) (Oral)   Resp 20   Ht 5\' 8"  (1.727 m)   Wt 108.9 kg   SpO2 95%   BMI 36.49 kg/m   Physical Exam Vitals and nursing note reviewed.  Constitutional:      General: He is not in acute distress.    Appearance: He is well-developed. He is not ill-appearing, toxic-appearing or diaphoretic.     Comments: Rocking back and forth on the bed  HENT:     Head: Normocephalic.  Eyes:     Conjunctiva/sclera: Conjunctivae normal.  Cardiovascular:     Rate and Rhythm: Normal rate and regular rhythm.     Heart sounds: No murmur heard.   Pulmonary:     Effort: Pulmonary effort is normal.  No respiratory distress.     Breath sounds: No stridor. No wheezing, rhonchi or rales.  Chest:     Chest wall: No tenderness.  Abdominal:     General: There is no distension.     Palpations: Abdomen is soft. There is no mass.     Tenderness: There is no abdominal tenderness. There is no right CVA tenderness, left CVA tenderness, guarding or rebound.     Hernia: No hernia is present.     Comments: Tender to palpation to the left upper and lower abdomen.  No rebound or guarding.  No CVA tenderness bilaterally.  Abdomen is nondistended.  Musculoskeletal:     Cervical back: Neck supple.     Right lower leg: No edema.     Left lower leg: No edema.  Skin:    General: Skin is warm and dry.  Neurological:      Mental Status: He is alert.  Psychiatric:        Behavior: Behavior normal.     ED Results / Procedures / Treatments   Labs (all labs ordered are listed, but only abnormal results are displayed) Labs Reviewed  BASIC METABOLIC PANEL - Abnormal; Notable for the following components:      Result Value   Glucose, Bld 102 (*)    BUN 22 (*)    Creatinine, Ser 1.29 (*)    All other components within normal limits  CBC  URINALYSIS, ROUTINE W REFLEX MICROSCOPIC    EKG None  Radiology US Renal  Result Date: 04/03/2021 CLINICAL DATA:  30 year old male with left flank pain. History of bilateral nephrolithiasis, left obstructive uropathy in March. EXAM: RENAL / URINARY TRACT ULTRASOUND COMPLETE COMPARISON:  CT Abdomen and Pelvis and ultrasound 03/11/2021 FINDINGS: Right Kidney: Renal measurements: 11.8 x 5.2 x 5.8 cm = volume: 186 mL. Echogenicity within normal limits. No mass or hydronephrosis visualized. The mostly punctate nephrolithiasis by CT last month is not evident by ultrasound. Left Kidney: Renal measurements: 13.9 x 7.3 x 9.4 cm = volume: 496 mL. Ongoing moderate to severe left hydronephrosis not significantly changed from last month (image 19). Stable left renal cortical echogenicity, with some cortical thinning noted when compared to the right kidney. The fairly extensive left nephrolithiasis demonstrated last month is not evident by ultrasound. Bladder: Decompressed, unremarkable. Other: Echogenic liver, hepatic steatosis redemonstrated (image 12). IMPRESSION: 1. Moderate to severe left hydronephrosis without improvement from last month when bulky obstructing mid-ureteral stone(s) demonstrated by CT. 2. Some left renal cortical atrophy suspected as the sequelae of obstructive uropathy. 3. Known bilateral nephrolithiasis not evident by ultrasound. 4. Normal ultrasound appearance of the right kidney. Electronically Signed   By: Odessa FlemingH  Hall M.D.   On: 04/03/2021 06:18     Procedures Procedures   Medications Ordered in ED Medications  promethazine (PHENERGAN) 12.5 mg in sodium chloride 0.9 % 50 mL IVPB (has no administration in time range)  HYDROmorphone (DILAUDID) injection 1 mg (has no administration in time range)  ketorolac (TORADOL) 30 MG/ML injection 30 mg (has no administration in time range)  ondansetron (ZOFRAN) injection 4 mg (4 mg Intravenous Given 04/03/21 0421)  HYDROmorphone (DILAUDID) injection 1 mg (1 mg Intravenous Given 04/03/21 0432)  metoCLOPramide (REGLAN) injection 10 mg (10 mg Intravenous Given 04/03/21 0522)  sodium chloride 0.9 % bolus 1,000 mL (0 mLs Intravenous Stopped 04/03/21 0644)  ketamine 50 mg in normal saline 5 mL (10 mg/mL) syringe (33 mg Intravenous Given 04/03/21 0523)    ED Course  I  have reviewed the triage vital signs and the nursing notes.  Pertinent labs & imaging results that were available during my care of the patient were reviewed by me and considered in my medical decision making (see chart for details).    MDM Rules/Calculators/A&P                          30 year old male with a history of kidney stones who presents the emergency department with a sudden onset left flank pain, nausea, vomiting.  Vital signs are stable.  Patient appears uncomfortable and is actively vomiting at bedside.  Shared decision-making conversation as I reviewed the patient's most recent CT scan which showed numerous stones in the bilateral kidneys.  Discussed treating his symptoms versus repeating imaging.  He would like to repeat an ultrasound.   Initially given Dilaudid and Zofran, but patient continued to be actively vomiting with no improvement in his pain.  He was given analgesia and his ketamine and Reglan.  He reported vivid images, and did not like the way the medication made him feel.  He continued to have active vomiting at bedside.  We will give Phenergan, repeat Dilaudid, and give Toradol.  Labs and imaging of been  reviewed and independently interpreted by me.  Renal ultrasound with moderate to severe left hydronephrosis without improvement from most recent CT.  Creatinine is improved from previous.  No metabolic derangements.  UA is unremarkable.  Patient care transferred to [facility] at the end of my shift to reevaluate the patient for pain control.  If pain is not controlled, will plan to consult urology.  If pain is able to be controlled, anticipate discharge to home.  Patient presentation, ED course, and plan of care discussed with review of all pertinent labs and imaging. Please see his/her note for further details regarding further ED course and disposition.     Final Clinical Impression(s) / ED Diagnoses Final diagnoses:  None    Rx / DC Orders ED Discharge Orders    None       Barkley Boards, PA-C 04/03/21 0711    Pollyann Savoy, MD 04/03/21 2326

## 2021-04-03 NOTE — ED Notes (Signed)
US at bedside

## 2021-04-03 NOTE — ED Provider Notes (Signed)
Received signout at the beginning of shift, please see previous providers notes for complete H&P.  This is a 30 year old male significant history of kidney stones presents with acute onset of left flank pain.  Work-up remarkable for moderate to severe left hydronephrosis with improvement from last month based on renal ultrasound.  Patient received multiple dose of pain medication at this time he rates his pain as 3 out of 10 and felt comfortable going home.  Recommend outpatient follow-up.  Return precaution given.  BP 110/70   Pulse (!) 48   Temp 98 F (36.7 C) (Oral)   Resp 10   Ht 5\' 8"  (1.727 m)   Wt 108.9 kg   SpO2 99%   BMI 36.49 kg/m   Results for orders placed or performed during the hospital encounter of 04/03/21  Basic metabolic panel  Result Value Ref Range   Sodium 139 135 - 145 mmol/L   Potassium 3.7 3.5 - 5.1 mmol/L   Chloride 105 98 - 111 mmol/L   CO2 24 22 - 32 mmol/L   Glucose, Bld 102 (H) 70 - 99 mg/dL   BUN 22 (H) 6 - 20 mg/dL   Creatinine, Ser 04/05/21 (H) 0.61 - 1.24 mg/dL   Calcium 9.4 8.9 - 4.65 mg/dL   GFR, Estimated 03.5 >46 mL/min   Anion gap 10 5 - 15  CBC  Result Value Ref Range   WBC 9.9 4.0 - 10.5 K/uL   RBC 4.67 4.22 - 5.81 MIL/uL   Hemoglobin 14.3 13.0 - 17.0 g/dL   HCT >56 81.2 - 75.1 %   MCV 89.1 80.0 - 100.0 fL   MCH 30.6 26.0 - 34.0 pg   MCHC 34.4 30.0 - 36.0 g/dL   RDW 70.0 17.4 - 94.4 %   Platelets 237 150 - 400 K/uL   nRBC 0.0 0.0 - 0.2 %  Urinalysis, Routine w reflex microscopic Urine, Clean Catch  Result Value Ref Range   Color, Urine YELLOW YELLOW   APPearance CLEAR CLEAR   Specific Gravity, Urine 1.018 1.005 - 1.030   pH 7.0 5.0 - 8.0   Glucose, UA NEGATIVE NEGATIVE mg/dL   Hgb urine dipstick NEGATIVE NEGATIVE   Bilirubin Urine NEGATIVE NEGATIVE   Ketones, ur NEGATIVE NEGATIVE mg/dL   Protein, ur NEGATIVE NEGATIVE mg/dL   Nitrite NEGATIVE NEGATIVE   Leukocytes,Ua NEGATIVE NEGATIVE   96.7 Renal  Result Date: 04/03/2021 CLINICAL  DATA:  30 year old male with left flank pain. History of bilateral nephrolithiasis, left obstructive uropathy in March. EXAM: RENAL / URINARY TRACT ULTRASOUND COMPLETE COMPARISON:  CT Abdomen and Pelvis and ultrasound 03/11/2021 FINDINGS: Right Kidney: Renal measurements: 11.8 x 5.2 x 5.8 cm = volume: 186 mL. Echogenicity within normal limits. No mass or hydronephrosis visualized. The mostly punctate nephrolithiasis by CT last month is not evident by ultrasound. Left Kidney: Renal measurements: 13.9 x 7.3 x 9.4 cm = volume: 496 mL. Ongoing moderate to severe left hydronephrosis not significantly changed from last month (image 19). Stable left renal cortical echogenicity, with some cortical thinning noted when compared to the right kidney. The fairly extensive left nephrolithiasis demonstrated last month is not evident by ultrasound. Bladder: Decompressed, unremarkable. Other: Echogenic liver, hepatic steatosis redemonstrated (image 12). IMPRESSION: 1. Moderate to severe left hydronephrosis without improvement from last month when bulky obstructing mid-ureteral stone(s) demonstrated by CT. 2. Some left renal cortical atrophy suspected as the sequelae of obstructive uropathy. 3. Known bilateral nephrolithiasis not evident by ultrasound. 4. Normal ultrasound appearance of the  right kidney. Electronically Signed   By: Odessa Fleming M.D.   On: 04/03/2021 06:18   US Renal  Result Date: 03/11/2021 CLINICAL DATA:  Left-sided flank pain EXAM: RENAL / URINARY TRACT ULTRASOUND COMPLETE COMPARISON:  09/27/2020 FINDINGS: Right Kidney: Renal measurements: 11.1 x 5.4 x 6.9 cm. = volume: 217 mL. No mass lesion or hydronephrosis is noted. Previously seen calculi are not well appreciated. Left Kidney: Renal measurements: 14.0 x 6.8 x 7.9 cm. = volume: 396 mL. Severe hydronephrosis is identified on the left increased from the prior exam. Previously seen calculi are not well appreciated. Bladder: Appears normal for degree of bladder  distention. Other: None. IMPRESSION: Increase in left-sided hydronephrosis. CT urogram may be helpful for further evaluation. Previously seen calculi bilaterally are not well appreciated on this exam. Electronically Signed   By: Alcide Clever M.D.   On: 03/11/2021 09:22   DG C-Arm 1-60 Min-No Report  Result Date: 03/11/2021 Fluoroscopy was utilized by the requesting physician.  No radiographic interpretation.   CT Renal Stone Study  Result Date: 03/11/2021 CLINICAL DATA:  Left flank pain EXAM: CT ABDOMEN AND PELVIS WITHOUT CONTRAST TECHNIQUE: Multidetector CT imaging of the abdomen and pelvis was performed following the standard protocol without oral or IV contrast. COMPARISON:  March 29, 2020. FINDINGS: Lower chest: Lung bases are clear. Hepatobiliary: There is diffuse decreased attenuation throughout much of the liver consistent with hepatic steatosis. Areas of relative fatty sparing noted inferiorly in the right lobe. No focal liver lesions evident on this noncontrast enhanced study. Gallbladder wall is not appreciably thickened. There is no biliary duct dilatation. Pancreas: There is no pancreatic mass or inflammatory focus. Spleen: No splenic lesions are evident. Adrenals/Urinary Tract: Adrenals bilaterally appear normal. There is no appreciable renal mass on either side. There is severe hydronephrosis on the left. There is no hydronephrosis on the right. On the right, there are multiple 1-3 mm calculi in the upper and lower pole regions. On the left, there are scattered 1 mm calculi in the upper pole region. There is a 4 x 3 mm calculus with a 5 x 4 mm calculus in the lower pole left kidney region. There is a calculus in the proximal left ureter at the L4 level measuring 1.3 x 0.7 cm. No other ureteral calculi are evident. Urinary bladder is midline with wall thickness within normal limits. Stomach/Bowel: Note degree of bowel malrotation, stable from previous study. Specifically, cecum is in the midline  pelvis with nearly all of colon to the left of midline. There is no appreciable bowel wall or mesenteric thickening. There is no evident bowel obstruction. Terminal ileum appears unremarkable. Appendix appears normal. There is no appreciable free air or portal venous air. Vascular/Lymphatic: No abdominal aortic aneurysm. No vascular lesions evident on noncontrast enhanced study. No adenopathy evident in the abdomen or pelvis. Reproductive: Prostate and seminal vesicles are normal in size and contour. There is minimal prostatic calcification. Other: No abscess or ascites evident in the abdomen or pelvis. There is slight fat in the umbilicus. Musculoskeletal: No blastic or lytic bone lesions. There is lumbar levoscoliosis. No intramuscular lesions. IMPRESSION: 1. There is a calculus measuring 1.3 x 0.7 cm in the left ureter at the level of L4 with marked hydronephrosis on the left. 2. Multiple calculi in each kidney, nonobstructing, larger and more numerous on the left than on the right. 3. Note that the cecum is in the midline pelvis. No bowel obstruction. No abscess in the abdomen or pelvis. Appendix  appears normal. 4.  Hepatic steatosis. Electronically Signed   By: Bretta Bang III M.D.   On: 03/11/2021 11:33      Fayrene Helper, PA-C 04/03/21 [id number]    Vanetta Mulders, MD 04/03/21 1723

## 2021-04-03 NOTE — ED Triage Notes (Signed)
Pt arrived via walk in, c/o left sided flank pain, states known kidney stones, was recently seen for same. Pain started to flare up last night.

## 2021-04-03 NOTE — Discharge Instructions (Signed)
Please follow up closely with your urologist for further management of your symptoms.  Return if you have any concerns.

## 2021-04-05 IMAGING — US US RENAL
1 series · 14 of 25 positions shown · non-contrast
Comparison: [REDACTED] [id number]

CLINICAL DATA: LEFT flank pain

EXAM:
RENAL / URINARY TRACT ULTRASOUND COMPLETE

[Series 1: us renal · 54 acquisitions, 14 frames shown]
[im 1/54]
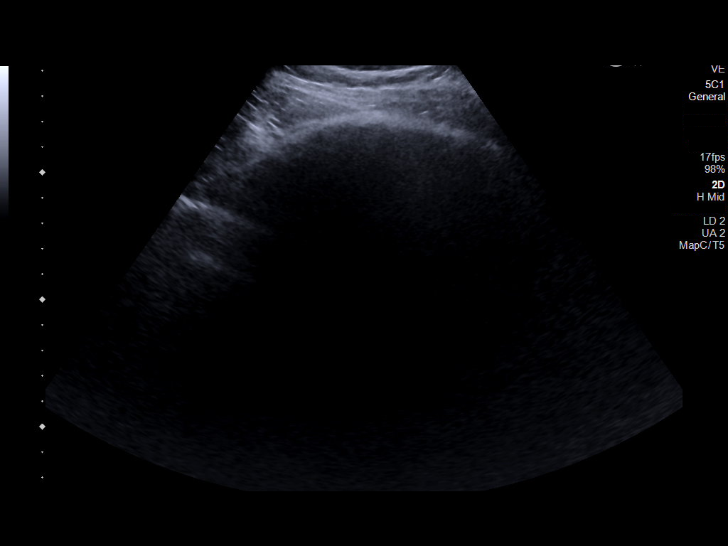
[im 5/54]
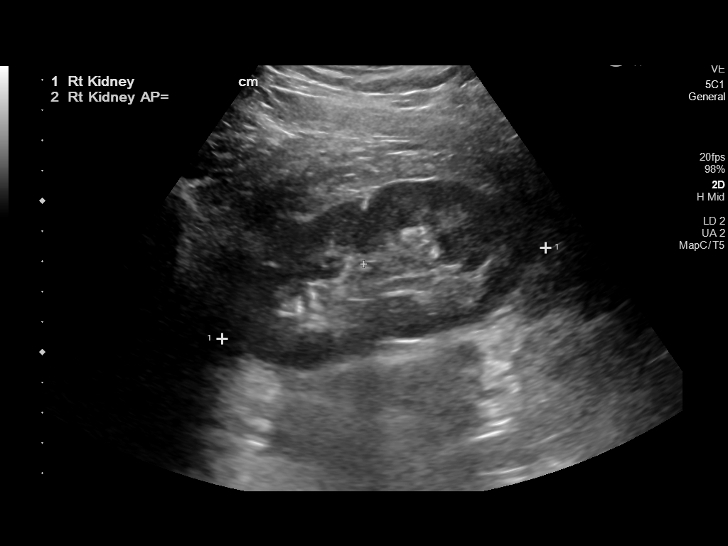
[im 9/54]
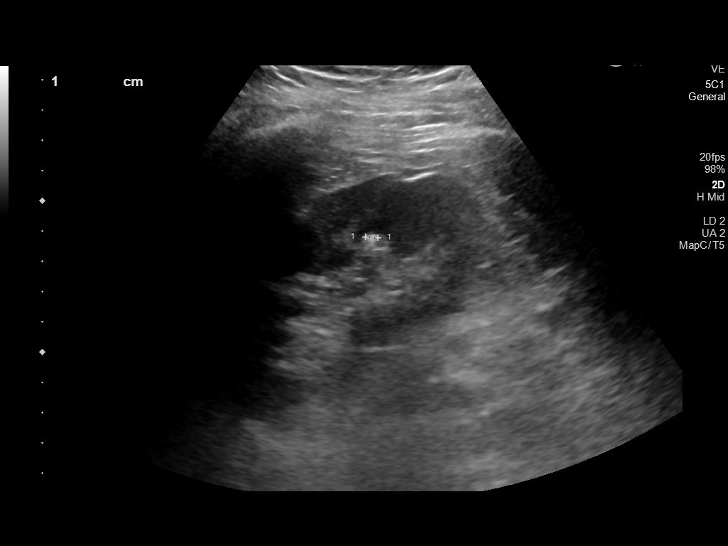
[im 14/54]
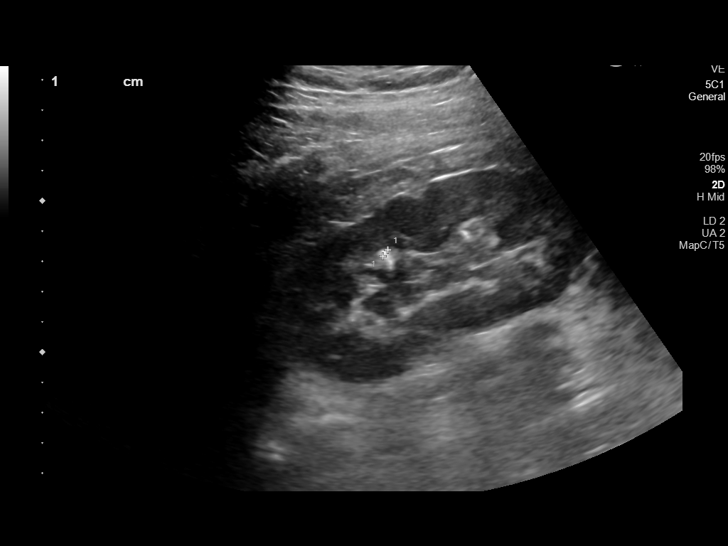
[im 18/54]
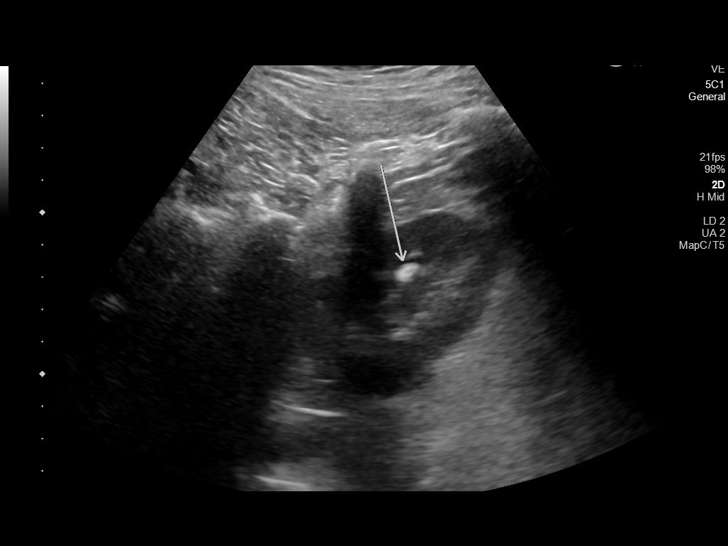
[im 20/54]
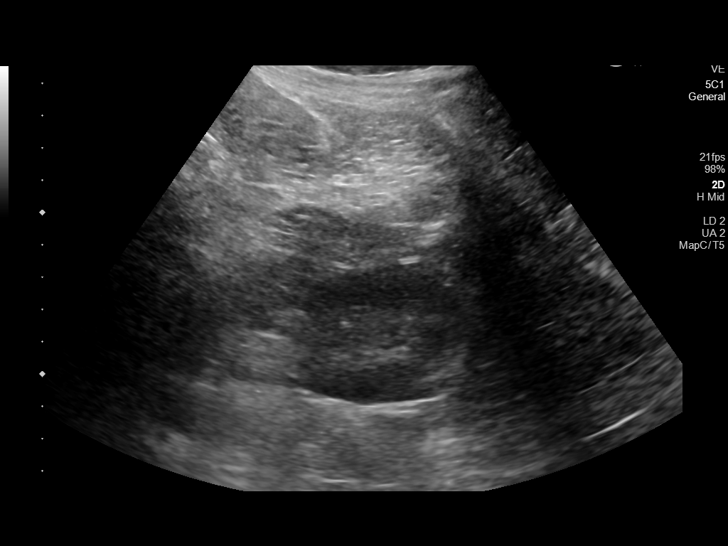
[im 25/54]
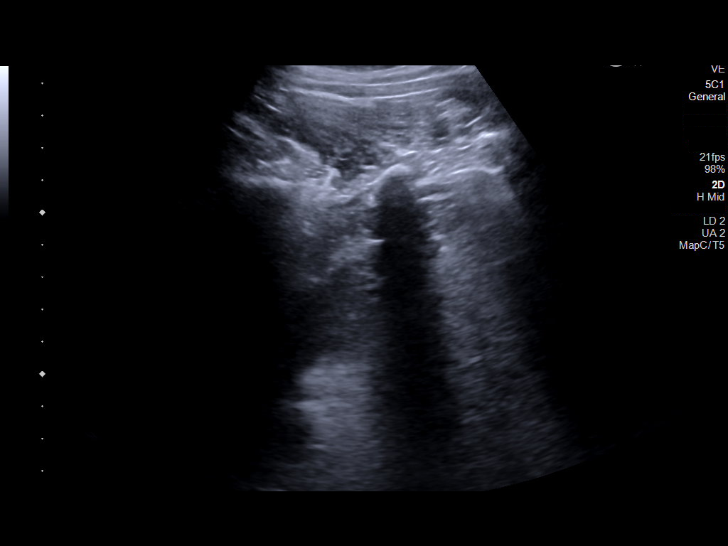
[im 29/54]
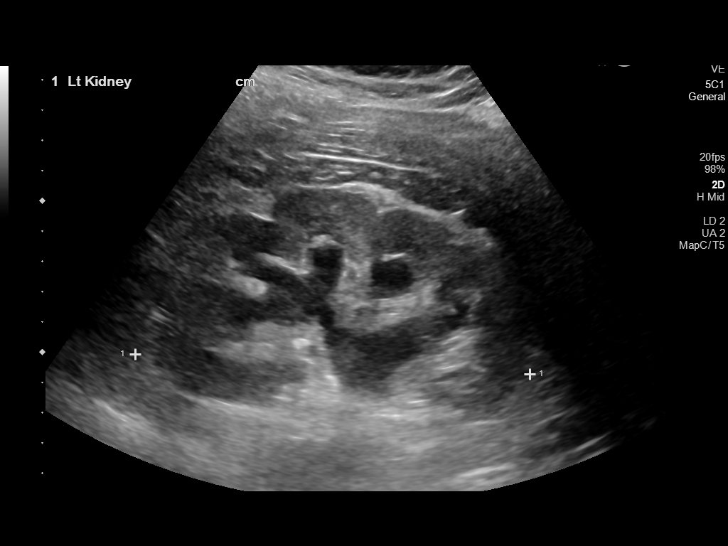
[im 34/54]
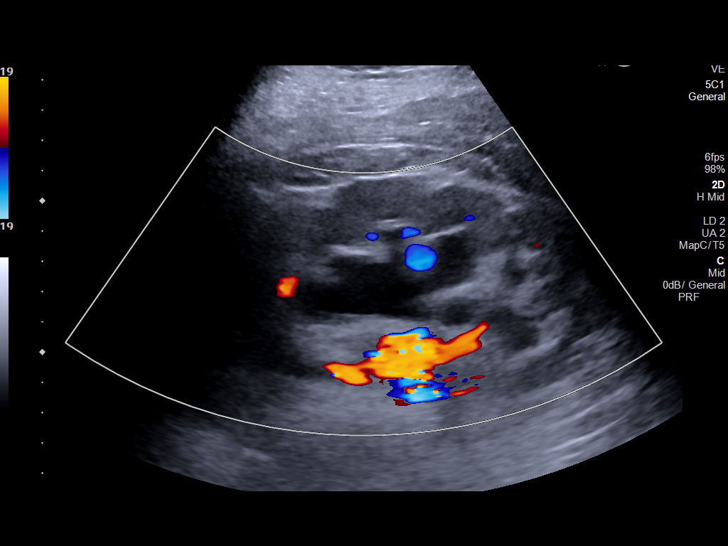
[im 36/54]
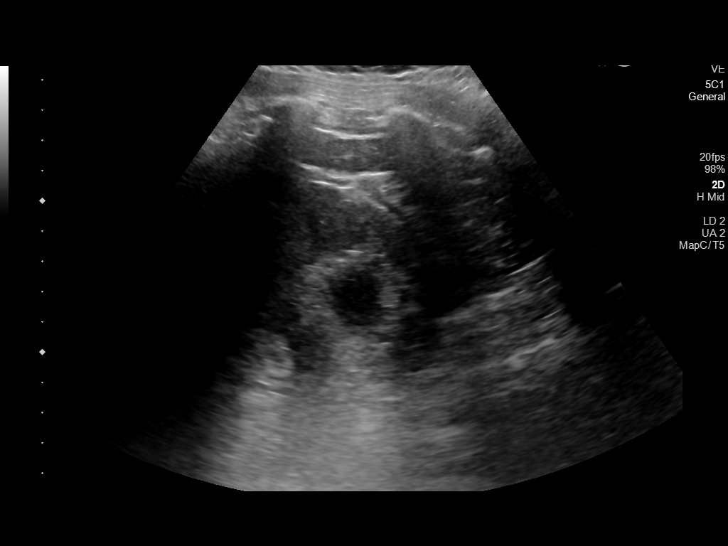
[im 40/54]
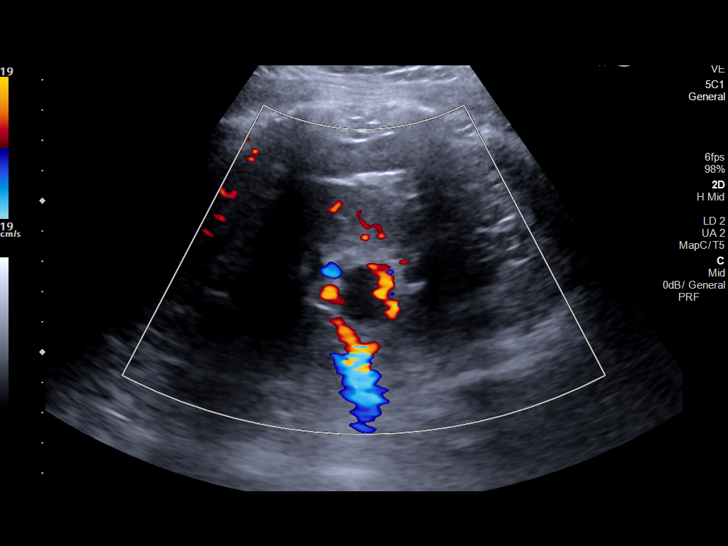
[im 45/54]
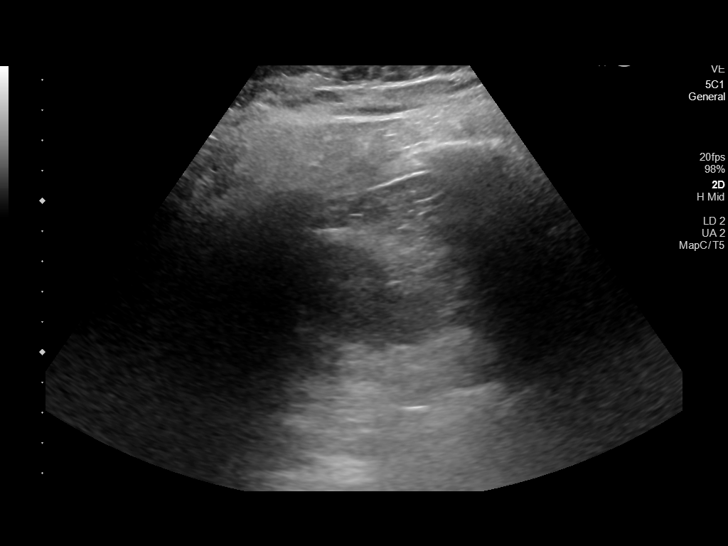
[im 49/54]
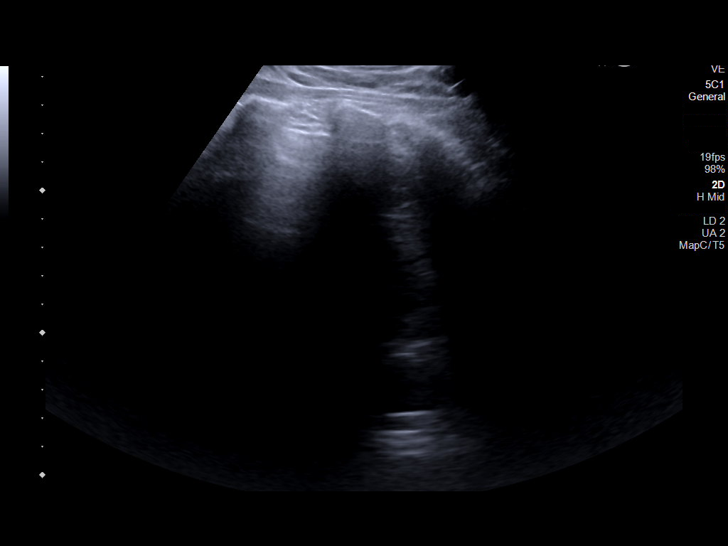
[im 54/54]
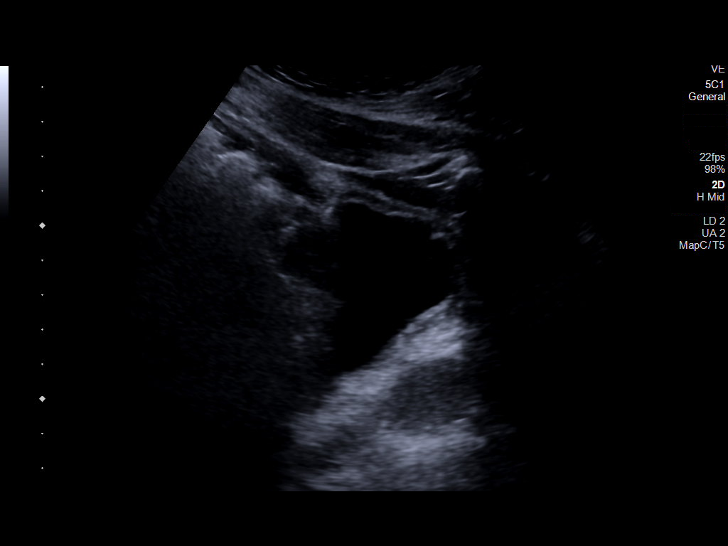

[14 of 25 positions shown; findings below may reference images not displayed]

FINDINGS: Right Kidney:

Renal measurements: 11.1 x 4.6 x 5.6 cm = volume: 150 mL.
Echogenicity is within normal limits. Multiple shadowing calculi are
noted, the largest of which measures 4 x 7 mm.

Left Kidney:

Renal measurements: 13.1 x 6.5 x 7.0 cm = volume: 311 mL.
Echogenicity is within normal limits. There is moderate
hydronephrosis. Multiple shadowing calculi are seen with largest in
the inferior pole measuring at least 5 mm. The proximal LEFT ureter
is visualized. The distal LEFT ureter is not visualized.

Bladder:

Appears normal for degree of bladder distention.

Other:

None.
IMPRESSION: 1. There is moderate LEFT hydroureteronephrosis, similar to CT in
[DATE]. The distal ureter is not visualized. No obstructing
urolithiasis is visualized. In [place], the obstructing urolithiasis
was within the mid ureter.
2. Multiple bilateral nephrolithiasis.

## 2021-04-19 ENCOUNTER — Emergency Department (HOSPITAL_COMMUNITY): Payer: BC Managed Care – PPO

## 2021-04-19 ENCOUNTER — Encounter (HOSPITAL_COMMUNITY)
Admission: EM | Disposition: A | Discharge status: Home / Self Care | Payer: Self-pay | Source: Home / Self Care | Attending: Emergency Medicine

## 2021-04-19 ENCOUNTER — Emergency Department (HOSPITAL_COMMUNITY): Payer: BC Managed Care – PPO | Admitting: Anesthesiology

## 2021-04-19 ENCOUNTER — Encounter (HOSPITAL_COMMUNITY): Payer: Self-pay | Admitting: Emergency Medicine

## 2021-04-19 ENCOUNTER — Ambulatory Visit (HOSPITAL_COMMUNITY)
Admission: EM | Admit: 2021-04-19 | Discharge: 2021-04-19 | Disposition: A | Discharge status: Home / Self Care | Payer: BC Managed Care – PPO | Attending: Urology | Admitting: Urology

## 2021-04-19 ENCOUNTER — Other Ambulatory Visit: Payer: Self-pay

## 2021-04-19 DIAGNOSIS — Z881 Allergy status to other antibiotic agents status: Secondary | ICD-10-CM | POA: Insufficient documentation

## 2021-04-19 DIAGNOSIS — N132 Hydronephrosis with renal and ureteral calculous obstruction: Secondary | ICD-10-CM | POA: Insufficient documentation

## 2021-04-19 DIAGNOSIS — N201 Calculus of ureter: Secondary | ICD-10-CM | POA: Diagnosis present

## 2021-04-19 DIAGNOSIS — Z888 Allergy status to other drugs, medicaments and biological substances status: Secondary | ICD-10-CM | POA: Diagnosis not present

## 2021-04-19 DIAGNOSIS — Z87891 Personal history of nicotine dependence: Secondary | ICD-10-CM | POA: Diagnosis not present

## 2021-04-19 DIAGNOSIS — Z8349 Family history of other endocrine, nutritional and metabolic diseases: Secondary | ICD-10-CM | POA: Insufficient documentation

## 2021-04-19 DIAGNOSIS — F902 Attention-deficit hyperactivity disorder, combined type: Secondary | ICD-10-CM | POA: Diagnosis not present

## 2021-04-19 DIAGNOSIS — Z791 Long term (current) use of non-steroidal anti-inflammatories (NSAID): Secondary | ICD-10-CM | POA: Diagnosis not present

## 2021-04-19 DIAGNOSIS — K760 Fatty (change of) liver, not elsewhere classified: Secondary | ICD-10-CM | POA: Diagnosis not present

## 2021-04-19 DIAGNOSIS — Z7982 Long term (current) use of aspirin: Secondary | ICD-10-CM | POA: Diagnosis not present

## 2021-04-19 DIAGNOSIS — Z87442 Personal history of urinary calculi: Secondary | ICD-10-CM | POA: Insufficient documentation

## 2021-04-19 DIAGNOSIS — Z823 Family history of stroke: Secondary | ICD-10-CM | POA: Insufficient documentation

## 2021-04-19 DIAGNOSIS — Z882 Allergy status to sulfonamides status: Secondary | ICD-10-CM | POA: Diagnosis not present

## 2021-04-19 DIAGNOSIS — Z841 Family history of disorders of kidney and ureter: Secondary | ICD-10-CM | POA: Insufficient documentation

## 2021-04-19 DIAGNOSIS — Z809 Family history of malignant neoplasm, unspecified: Secondary | ICD-10-CM | POA: Diagnosis not present

## 2021-04-19 DIAGNOSIS — Z8249 Family history of ischemic heart disease and other diseases of the circulatory system: Secondary | ICD-10-CM | POA: Insufficient documentation

## 2021-04-19 DIAGNOSIS — G43009 Migraine without aura, not intractable, without status migrainosus: Secondary | ICD-10-CM | POA: Diagnosis not present

## 2021-04-19 DIAGNOSIS — I471 Supraventricular tachycardia: Secondary | ICD-10-CM | POA: Diagnosis not present

## 2021-04-19 DIAGNOSIS — Z419 Encounter for procedure for purposes other than remedying health state, unspecified: Secondary | ICD-10-CM

## 2021-04-19 DIAGNOSIS — Z79899 Other long term (current) drug therapy: Secondary | ICD-10-CM | POA: Diagnosis not present

## 2021-04-19 DIAGNOSIS — Z20822 Contact with and (suspected) exposure to covid-19: Secondary | ICD-10-CM | POA: Insufficient documentation

## 2021-04-19 DIAGNOSIS — N179 Acute kidney failure, unspecified: Secondary | ICD-10-CM | POA: Diagnosis not present

## 2021-04-19 DIAGNOSIS — M4186 Other forms of scoliosis, lumbar region: Secondary | ICD-10-CM | POA: Diagnosis not present

## 2021-04-19 DIAGNOSIS — N1330 Unspecified hydronephrosis: Secondary | ICD-10-CM | POA: Diagnosis not present

## 2021-04-19 HISTORY — PX: CYSTOSCOPY W/ URETERAL STENT PLACEMENT: SHX1429

## 2021-04-19 LAB — RESP PANEL BY RT-PCR (FLU A&B, COVID) ARPGX2
Influenza A by PCR: NEGATIVE
Influenza B by PCR: NEGATIVE
SARS Coronavirus 2 by RT PCR: NEGATIVE

## 2021-04-19 LAB — COMPREHENSIVE METABOLIC PANEL
ALT: 30 U/L (ref 0–44)
AST: 32 U/L (ref 15–41)
Albumin: 4.8 g/dL (ref 3.5–5.0)
Alkaline Phosphatase: 64 U/L (ref 38–126)
Anion gap: 12 (ref 5–15)
BUN: 18 mg/dL (ref 6–20)
CO2: 25 mmol/L (ref 22–32)
Calcium: 10.1 mg/dL (ref 8.9–10.3)
Chloride: 102 mmol/L (ref 98–111)
Creatinine, Ser: 1.37 mg/dL — ABNORMAL HIGH (ref 0.61–1.24)
GFR, Estimated: >60 mL/min (ref >60)
Glucose, Bld: 130 mg/dL — ABNORMAL HIGH (ref 70–99)
Potassium: 3.7 mmol/L (ref 3.5–5.1)
Sodium: 139 mmol/L (ref 135–145)
Total Bilirubin: 0.6 mg/dL (ref 0.3–1.2)
Total Protein: 8.4 g/dL — ABNORMAL HIGH (ref 6.5–8.1)

## 2021-04-19 LAB — URINALYSIS, ROUTINE W REFLEX MICROSCOPIC
Bilirubin Urine: NEGATIVE
Glucose, UA: NEGATIVE mg/dL
Hgb urine dipstick: NEGATIVE
Ketones, ur: NEGATIVE mg/dL
Leukocytes,Ua: NEGATIVE
Nitrite: NEGATIVE
Protein, ur: NEGATIVE mg/dL
Specific Gravity, Urine: 1.02 (ref 1.005–1.030)
pH: 8 (ref 5.0–8.0)

## 2021-04-19 LAB — CBC WITH DIFFERENTIAL/PLATELET
Abs Immature Granulocytes: 0.02 10*3/uL (ref 0.00–0.07)
Basophils Absolute: 0 10*3/uL (ref 0.0–0.1)
Basophils Relative: 0 %
Eosinophils Absolute: 0 10*3/uL (ref 0.0–0.5)
Eosinophils Relative: 0 %
HCT: 44.3 % (ref 39.0–52.0)
Hemoglobin: 15.5 g/dL (ref 13.0–17.0)
Immature Granulocytes: 0 %
Lymphocytes Relative: 9 %
Lymphs Abs: 0.8 10*3/uL (ref 0.7–4.0)
MCH: 30.6 pg (ref 26.0–34.0)
MCHC: 35 g/dL (ref 30.0–36.0)
MCV: 87.4 fL (ref 80.0–100.0)
Monocytes Absolute: 0.3 10*3/uL (ref 0.1–1.0)
Monocytes Relative: 3 %
Neutro Abs: 8.4 10*3/uL — ABNORMAL HIGH (ref 1.7–7.7)
Neutrophils Relative %: 88 %
Platelets: 219 10*3/uL (ref 150–400)
RBC: 5.07 MIL/uL (ref 4.22–5.81)
RDW: 11.8 % (ref 11.5–15.5)
WBC: 9.6 10*3/uL (ref 4.0–10.5)
nRBC: 0 % (ref 0.0–0.2)

## 2021-04-19 SURGERY — CYSTOSCOPY, WITH RETROGRADE PYELOGRAM AND URETERAL STENT INSERTION
Anesthesia: General | Site: Ureter | Laterality: Bilateral

## 2021-04-19 MED ORDER — CEFAZOLIN SODIUM-DEXTROSE 2-3 GM-%(50ML) IV SOLR
INTRAVENOUS | Status: DC | PRN
  Administered 2021-04-19: 2 g via INTRAVENOUS

## 2021-04-19 MED ORDER — SODIUM CHLORIDE 0.9 % IR SOLN
Status: DC | PRN
  Administered 2021-04-19: 3000 mL

## 2021-04-19 MED ORDER — SUCCINYLCHOLINE CHLORIDE 200 MG/10ML IV SOSY
PREFILLED_SYRINGE | INTRAVENOUS | Status: AC
  Filled 2021-04-19: qty 10

## 2021-04-19 MED ORDER — FENTANYL CITRATE (PF) 100 MCG/2ML IJ SOLN: 25.0000 ug | INTRAMUSCULAR | Status: DC | PRN

## 2021-04-19 MED ORDER — KETOROLAC TROMETHAMINE 10 MG PO TABS: 10.0000 mg | ORAL_TABLET | Freq: Four times a day (QID) | ORAL | 0 refills | Status: DC | PRN

## 2021-04-19 MED ORDER — PROPOFOL 10 MG/ML IV BOLUS
INTRAVENOUS | Status: DC | PRN
  Administered 2021-04-19: 180 mg via INTRAVENOUS

## 2021-04-19 MED ORDER — DEXAMETHASONE SODIUM PHOSPHATE 4 MG/ML IJ SOLN
INTRAMUSCULAR | Status: DC | PRN
  Administered 2021-04-19: 10 mg via INTRAVENOUS

## 2021-04-19 MED ORDER — KETOROLAC TROMETHAMINE 30 MG/ML IJ SOLN
15.0000 mg | Freq: Once | INTRAMUSCULAR | Status: AC
  Administered 2021-04-19: 15 mg via INTRAVENOUS
  Filled 2021-04-19: qty 1

## 2021-04-19 MED ORDER — CEFAZOLIN SODIUM-DEXTROSE 2-4 GM/100ML-% IV SOLN
INTRAVENOUS | Status: AC
  Filled 2021-04-19: qty 100

## 2021-04-19 MED ORDER — ONDANSETRON HCL 4 MG/2ML IJ SOLN
INTRAMUSCULAR | Status: DC | PRN
  Administered 2021-04-19: 4 mg via INTRAVENOUS

## 2021-04-19 MED ORDER — FENTANYL CITRATE (PF) 100 MCG/2ML IJ SOLN
INTRAMUSCULAR | Status: AC
  Filled 2021-04-19: qty 2

## 2021-04-19 MED ORDER — OXYCODONE HCL 5 MG/5ML PO SOLN: 5.0000 mg | Freq: Once | ORAL | Status: DC | PRN

## 2021-04-19 MED ORDER — SODIUM CHLORIDE 0.9 % IV BOLUS
1000.0000 mL | Freq: Once | INTRAVENOUS | Status: AC
  Administered 2021-04-19: 1000 mL via INTRAVENOUS

## 2021-04-19 MED ORDER — SUCCINYLCHOLINE CHLORIDE 200 MG/10ML IV SOSY
PREFILLED_SYRINGE | INTRAVENOUS | Status: DC | PRN
  Administered 2021-04-19: 160 mg via INTRAVENOUS

## 2021-04-19 MED ORDER — HYDROMORPHONE HCL 1 MG/ML IJ SOLN
1.0000 mg | Freq: Once | INTRAMUSCULAR | Status: AC
  Administered 2021-04-19: 1 mg via INTRAVENOUS
  Filled 2021-04-19: qty 1

## 2021-04-19 MED ORDER — PROMETHAZINE HCL 25 MG/ML IJ SOLN: 6.2500 mg | INTRAMUSCULAR | Status: DC | PRN

## 2021-04-19 MED ORDER — ONDANSETRON HCL 4 MG/2ML IJ SOLN
4.0000 mg | Freq: Once | INTRAMUSCULAR | Status: AC
  Administered 2021-04-19: 4 mg via INTRAVENOUS
  Filled 2021-04-19: qty 2

## 2021-04-19 MED ORDER — HYDROMORPHONE HCL 1 MG/ML IJ SOLN
1.0000 mg | INTRAMUSCULAR | Status: DC | PRN
  Administered 2021-04-19: 1 mg via INTRAVENOUS
  Filled 2021-04-19: qty 1

## 2021-04-19 MED ORDER — ONDANSETRON HCL 4 MG/2ML IJ SOLN
INTRAMUSCULAR | Status: AC
  Filled 2021-04-19: qty 2

## 2021-04-19 MED ORDER — FENTANYL CITRATE (PF) 100 MCG/2ML IJ SOLN
INTRAMUSCULAR | Status: DC | PRN
  Administered 2021-04-19: 100 ug via INTRAVENOUS

## 2021-04-19 MED ORDER — LIDOCAINE 2% (20 MG/ML) 5 ML SYRINGE
INTRAMUSCULAR | Status: DC | PRN
  Administered 2021-04-19: 60 mg via INTRAVENOUS

## 2021-04-19 MED ORDER — LACTATED RINGERS IV SOLN: INTRAVENOUS | Status: DC | PRN

## 2021-04-19 MED ORDER — DEXAMETHASONE SODIUM PHOSPHATE 10 MG/ML IJ SOLN
INTRAMUSCULAR | Status: AC
  Filled 2021-04-19: qty 1

## 2021-04-19 MED ORDER — LIDOCAINE 2% (20 MG/ML) 5 ML SYRINGE
INTRAMUSCULAR | Status: AC
  Filled 2021-04-19: qty 5

## 2021-04-19 MED ORDER — ONDANSETRON HCL 4 MG/2ML IJ SOLN: 4.0000 mg | INTRAMUSCULAR | Status: DC | PRN

## 2021-04-19 MED ORDER — MIDAZOLAM HCL 5 MG/5ML IJ SOLN
INTRAMUSCULAR | Status: DC | PRN
  Administered 2021-04-19: 2 mg via INTRAVENOUS

## 2021-04-19 MED ORDER — TAMSULOSIN HCL 0.4 MG PO CAPS
0.4000 mg | ORAL_CAPSULE | Freq: Once | ORAL | Status: AC
  Administered 2021-04-19: 0.4 mg via ORAL
  Filled 2021-04-19: qty 1

## 2021-04-19 MED ORDER — MIDAZOLAM HCL 2 MG/2ML IJ SOLN
INTRAMUSCULAR | Status: AC
  Filled 2021-04-19: qty 2

## 2021-04-19 MED ORDER — PROPOFOL 10 MG/ML IV BOLUS
INTRAVENOUS | Status: AC
  Filled 2021-04-19: qty 20

## 2021-04-19 MED ORDER — FENTANYL CITRATE (PF) 100 MCG/2ML IJ SOLN
100.0000 ug | Freq: Once | INTRAMUSCULAR | Status: AC
  Administered 2021-04-19: 100 ug via INTRAVENOUS
  Filled 2021-04-19: qty 2

## 2021-04-19 MED ORDER — DROPERIDOL 2.5 MG/ML IJ SOLN: 0.6250 mg | Freq: Once | INTRAMUSCULAR | Status: DC | PRN

## 2021-04-19 MED ORDER — OXYCODONE HCL 5 MG PO TABS: 5.0000 mg | ORAL_TABLET | Freq: Once | ORAL | Status: DC | PRN

## 2021-04-19 MED ORDER — IOHEXOL 300 MG/ML  SOLN
INTRAMUSCULAR | Status: DC | PRN
  Administered 2021-04-19: 22 mL

## 2021-04-19 SURGICAL SUPPLY — 14 items
BAG URO CATCHER STRL LF (MISCELLANEOUS) ×2 IMPLANT
BASKET ZERO TIP NITINOL 2.4FR (BASKET) IMPLANT
CATH INTERMIT  6FR 70CM (CATHETERS) IMPLANT
CLOTH BEACON ORANGE TIMEOUT ST (SAFETY) ×2 IMPLANT
GLOVE SURG ENC TEXT LTX SZ7.5 (GLOVE) ×2 IMPLANT
GOWN STRL REUS W/TWL LRG LVL3 (GOWN DISPOSABLE) ×2 IMPLANT
GUIDEWIRE ANG ZIPWIRE 038X150 (WIRE) ×2 IMPLANT
GUIDEWIRE STR DUAL SENSOR (WIRE) IMPLANT
KIT TURNOVER KIT A (KITS) ×2 IMPLANT
MANIFOLD NEPTUNE II (INSTRUMENTS) ×2 IMPLANT
PACK CYSTO (CUSTOM PROCEDURE TRAY) ×2 IMPLANT
STENT POLARIS 5FRX26 (STENTS) ×4 IMPLANT
TUBING CONNECTING 10 (TUBING) ×2 IMPLANT
TUBING UROLOGY SET (TUBING) IMPLANT

## 2021-04-19 NOTE — Anesthesia Procedure Notes (Signed)
Procedure Name: Intubation Date/Time: 04/19/2021 3:40 PM Performed by: Vanessa Bethel, CRNA Pre-anesthesia Checklist: Patient identified, Emergency Drugs available, Suction available and Patient being monitored Patient Re-evaluated:Patient Re-evaluated prior to induction Oxygen Delivery Method: Circle system utilized Preoxygenation: Pre-oxygenation with 100% oxygen Induction Type: IV induction Ventilation: Mask ventilation without difficulty Laryngoscope Size: 2 and Miller Grade View: Grade I Tube type: Oral Tube size: 7.5 mm Number of attempts: 1 Airway Equipment and Method: Stylet Placement Confirmation: ETT inserted through vocal cords under direct vision,  positive ETCO2 and breath sounds checked- equal and bilateral Secured at: 22 cm Tube secured with: Tape Dental Injury: Teeth and Oropharynx as per pre-operative assessment

## 2021-04-19 NOTE — Anesthesia Preprocedure Evaluation (Addendum)
Anesthesia Evaluation  Patient identified by MRN, date of birth, ID band Patient awake    Reviewed: Allergy & Precautions, H&P , NPO status , Patient's Chart, lab work & pertinent test results  History of Anesthesia Complications (+) PONV and history of anesthetic complications  Airway Mallampati: III  TM Distance: >3 FB Neck ROM: Full    Dental no notable dental hx. (+) Teeth Intact, Dental Advisory Given   Pulmonary neg pulmonary ROS, former smoker,    Pulmonary exam normal breath sounds clear to auscultation       Cardiovascular negative cardio ROS   Rhythm:Regular Rate:Normal     Neuro/Psych  Headaches, PSYCHIATRIC DISORDERS    GI/Hepatic negative GI ROS, Neg liver ROS,   Endo/Other  negative endocrine ROS  Renal/GU Renal disease (kidney stones)  negative genitourinary   Musculoskeletal   Abdominal   Peds negative pediatric ROS (+)  Hematology negative hematology ROS (+)   Anesthesia Other Findings   Reproductive/Obstetrics negative OB ROS                            Anesthesia Physical  Anesthesia Plan  ASA: II  Anesthesia Plan: General   Post-op Pain Management:    Induction: Intravenous, Rapid sequence and Cricoid pressure planned  PONV Risk Score and Plan: Ondansetron, Dexamethasone, Midazolam and Treatment may vary due to age or medical condition  Airway Management Planned: LMA  Additional Equipment: None  Intra-op Plan:   Post-operative Plan: Extubation in OR  Informed Consent: I have reviewed the patients History and Physical, chart, labs and discussed the procedure including the risks, benefits and alternatives for the proposed anesthesia with the patient or authorized representative who has indicated his/her understanding and acceptance.     Dental advisory given  Plan Discussed with: CRNA and Anesthesiologist  Anesthesia Plan Comments: (GA/LMA vs. ETT  depending on patient's clinical status when seen in preop. Tanna Furry, MD  )      Anesthesia Quick Evaluation

## 2021-04-19 NOTE — ED Triage Notes (Signed)
Patient reports left flank pain since last night. He reports hx kidney stones and recently had stent placed in March. He also reports n/v, urgency and dysuria. Reports taking flomax and tylenol w/ no relief. Denies fevers.

## 2021-04-19 NOTE — ED Provider Notes (Signed)
[facility]-EMERGENCY DEPT Provider Note   CSN: [id number] Arrival date & time: 04/19/21  1660     History No chief complaint on file.   Thomas Howell is a 30 y.o. male.  HPI      Thomas Howell is a 30 y.o. male, with a history of kidney stones, presenting to the ED with left lower back/flank pain beginning last night around 11pm.   Pain is left lower back and left flank, 10/10, nonradiating, constant.  Accompanied by nausea and vomiting. Patient had left ureteral stent placed March 11, 2021 by Dr. Retta Diones.  Patient states stent removed mid April.  He has not had pain or symptoms since stent removal. States he took a dose of Flomax last night. Denies fever/chills, difficulty urinating, dysuria, hematuria, syncope, abdominal pain, diarrhea, or any other complaints.   Past Medical History:  Diagnosis Date  . Complication of anesthesia   . Frequency-urgency syndrome   . History of kidney stones   . Hx of nausea and vomiting    d/t kidney stone  . Hx of sepsis 08/11/2017   due to kidney stone/ hydronephrosis  . Left ureteral stone   . Migraine   . Pain due to ureteral stent (HCC)   . PONV (postoperative nausea and vomiting)    none recently  . Renal calculi    bilateral per ct 07-26-2016  . Scoliosis of lumbar spine 1994   treated at [facility] until age 56  . Wears glasses     Patient Active Problem List   Diagnosis Date Noted  . Right ureteral stone 10/13/2018  . Acute pyelonephritis 08/14/2018  . Bilateral hydronephrosis 08/14/2018  . Sepsis (HCC) 08/14/2018  . SVT (supraventricular tachycardia) (HCC) 05/22/2018  . Attention deficit hyperactivity disorder (ADHD), combined type 04/08/2018  . Chronic migraine 02/14/2017  . Neck pain 02/04/2016  . Hydronephrosis, left 08/19/2015  . Hydronephrosis determined by ultrasound   . Kidney stone on left side 08/18/2015  . Nephrolithiasis 08/18/2015  . Kidney stone 07/06/2015  . Left ureteral  stone 07/06/2015  . Analgesic rebound headache 06/05/2014  . Migraine headache without aura 05/30/2014    Past Surgical History:  Procedure Laterality Date  . CYSTOSCOPY W/ RETROGRADES Right 07/06/2015   Procedure: CYSTOSCOPY WITH RETROGRADE PYELOGRAM;  Surgeon: Jerilee Field, MD;  Location: [facility];  Service: Urology;  Laterality: Right;  . CYSTOSCOPY W/ URETERAL STENT PLACEMENT Left 08/08/2015   Procedure: CYSTOSCOPY WITH STENT REPLACEMENT;  Surgeon: Jerilee Field, MD;  Location: [facility];  Service: Urology;  Laterality: Left;  . CYSTOSCOPY W/ URETERAL STENT PLACEMENT Left 02/21/2017   Procedure: CYSTOSCOPY WITH RETROGRADE PYELOGRAM/URETERAL LEFT STENT PLACEMENT WITH  LASER;  Surgeon: Bjorn Pippin, MD;  Location: [facility];  Service: Urology;  Laterality: Left;  . CYSTOSCOPY W/ URETERAL STENT PLACEMENT Left 08/10/2017   Procedure: CYSTOSCOPY WITH RETROGRADE PYELOGRAM/URETERAL STENT PLACEMENT;  Surgeon: Heloise Purpura, MD;  Location: [facility];  Service: Urology;  Laterality: Left;  . CYSTOSCOPY WITH RETROGRADE PYELOGRAM, URETEROSCOPY AND STENT PLACEMENT Left 06/24/2014   Procedure: CYSTOSCOPY WITH RETROGRADE PYELOGRAM,  AND STENT PLACEMENT;  Surgeon: Magdalene Molly, MD;  Location: [facility];  Service: Urology;  Laterality: Left;  . CYSTOSCOPY WITH RETROGRADE PYELOGRAM, URETEROSCOPY AND STENT PLACEMENT Left 07/05/2014   Procedure: CYSTO/LEFT URETEROSCOPY/LEFT RETROGRADE PYELOGRAM/LEFT STENT PLACEMENT;  Surgeon: Jerilee Field, MD;  Location: [facility];  Service: Urology;  Laterality: Left;  . CYSTOSCOPY WITH RETROGRADE PYELOGRAM, URETEROSCOPY AND STENT PLACEMENT Left 07/06/2015  Procedure: CYSTOSCOPY WITH RETROGRADE PYELOGRAM, URETEROSCOPY , LASER, STENT PLACEMENT and BASKET EXTRACTION;  Surgeon: Jerilee Field, MD;  Location: [facility];  Service: Urology;  Laterality: Left;  . CYSTOSCOPY WITH RETROGRADE PYELOGRAM, URETEROSCOPY AND STENT PLACEMENT Left 08/19/2015    Procedure: CYSTOSCOPY WITH RETROGRADE PYELOGRAM, URETEROSCOPY AND STENT PLACEMENT;  Surgeon: Jerilee Field, MD;  Location: [facility];  Service: Urology;  Laterality: Left;  . CYSTOSCOPY WITH RETROGRADE PYELOGRAM, URETEROSCOPY AND STENT PLACEMENT Left 03/13/2018   Procedure: CYSTOSCOPY WITH RETROGRADE PYELOGRAM, URETEROSCOPY AND LEFT STENT PLACEMENT;  Surgeon: Bjorn Pippin, MD;  Location: [facility];  Service: Urology;  Laterality: Left;  . CYSTOSCOPY WITH RETROGRADE PYELOGRAM, URETEROSCOPY AND STENT PLACEMENT Left 03/11/2021   Procedure: CYSTOSCOPY WITH RETROGRADE PYELOGRAM, URETEROSCOPY AND STENT PLACEMENT,STONE EXTRACTION,HOLMIUM LASER;  Surgeon: Marcine Matar, MD;  Location: [facility];  Service: Urology;  Laterality: Left;  . CYSTOSCOPY WITH URETEROSCOPY AND STENT PLACEMENT Left 01/16/2016   Procedure: CYSTOSCOPY WITH LEFT RETROGRADE PYELOGRAM  LEFT DIGITAL URETEROSCOPY AND PLACEMENT LEFT URETERAL STENT;  Surgeon: Jerilee Field, MD;  Location: [facility];  Service: Urology;  Laterality: Left;  . CYSTOSCOPY WITH URETEROSCOPY, STONE BASKETRY AND STENT PLACEMENT Left 02/13/2016   Procedure: CYSTOSCOPY WITH LEFT URETEROSCOPY, HOLMIUM LASER AND STENT PLACEMENT;  Surgeon: Jerilee Field, MD;  Location: [facility];  Service: Urology;  Laterality: Left;  . CYSTOSCOPY/RETROGRADE/URETEROSCOPY/STONE EXTRACTION WITH BASKET Left 08/08/2015   Procedure: CYSTOSCOPY/RETROGRADE/URETEROSCOPY/STONE EXTRACTION WITH BASKET;  Surgeon: Jerilee Field, MD;  Location: [facility];  Service: Urology;  Laterality: Left;  . CYSTOSCOPY/URETEROSCOPY/HOLMIUM LASER/STENT PLACEMENT Left 07/30/2016   Procedure: CYSTOSCOPY/URETEROSCOPY/HOLMIUM LASER/STENT PLACEMENT;  Surgeon: Jerilee Field, MD;  Location: [facility];  Service: Urology;  Laterality: Left;  . CYSTOSCOPY/URETEROSCOPY/HOLMIUM LASER/STENT PLACEMENT Left 08/19/2017   Procedure: CYSTOSCOPY/URETEROSCOPY/HOLMIUM LASER/STENT PLACEMENT;  Surgeon:  Jerilee Field, MD;  Location: [facility];  Service: Urology;  Laterality: Left;  . CYSTOSCOPY/URETEROSCOPY/HOLMIUM LASER/STENT PLACEMENT N/A 10/13/2018   Procedure: CYSTOSCOPY/RETROGRADE RIGHT URETEROSCOPY/ AND RIGHTSTENT PLACEMENT;  Surgeon: Bjorn Pippin, MD;  Location: [facility];  Service: Urology;  Laterality: N/A;  . EXTRACORPOREAL SHOCK WAVE LITHOTRIPSY Left 07-07-2015  &  12-26-2014  . 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 HOLMIUM LASER APPLICATION Left 07/05/2014   Procedure: LASER LITHO;  Surgeon: Jerilee Field, MD;  Location[facility];  Service: Urology;  Laterality: Left;  . HOLMIUM LASER APPLICATION Left 08/08/2015   Procedure: HOLMIUM LASER  WITH LITHOTRIPSY ;  Surgeon: Jerilee Field, MD;  Location: [facility];  Service: Urology;  Laterality: Left;  . HOLMIUM LASER APPLICATION Left 02/21/2017   Procedure: HOLMIUM LASER APPLICATION;  Surgeon: Bjorn Pippin, MD;  Location: [facility];  Service: Urology;  Laterality: Left;  . HOLMIUM LASER APPLICATION Left 03/13/2018   Procedure: HOLMIUM LASER APPLICATION;  Surgeon: Bjorn Pippin, MD;  Location: [facility];  Service: Urology;  Laterality: Left;  . LYMPH GLAND EXCISION  2003   neck--  benign  . SVT ABLATION N/A 05/22/2018   Procedure: SVT ABLATION;  Surgeon: Marinus Maw, MD;  Location: [facility];  Service: Cardiovascular;  Laterality: N/A;       Family History  Problem Relation Age of Onset  . Cancer Father   . Kidney Stones Mother   . Cancer Other   . Hypertension Other   . Hyperlipidemia Other   . Stroke Other   . Kidney Stones Brother     Social History   Tobacco Use  .  Smoking status: Former Smoker    Years: 2.00    Quit date: 07/29/2014    Years since quitting: 6.7  . Smokeless tobacco: Never Used  . Tobacco comment: social smoker  Vaping Use  . Vaping Use: Never used  Substance Use Topics  .  Alcohol use: Yes    Alcohol/week: 0.0 standard drinks    Comment: occ  . Drug use: No    Home Medications Prior to Admission medications   Medication Sig Start Date End Date Taking? Authorizing Provider  aspirin-acetaminophen-caffeine (EXCEDRIN MIGRAINE) [id number] MG tablet Take 2 tablets by mouth every 6 (six) hours as needed for headache.   Yes [provider]  hydrochlorothiazide (HYDRODIURIL) 25 MG tablet Take 25 mg by mouth daily.    Yes [provider]  rizatriptan (MAXALT-MLT) 10 MG disintegrating tablet TAKE 1 TABLET BY MOUTH AS NEEDED MAY REPEAT IN 2 HOURS IF NEEDED Patient taking differently: Take 10 mg by mouth daily as needed for migraine. May repeat in 2 hrs if needed 07/10/20  Yes Lomax, Amy, NP  tamsulosin (FLOMAX) 0.4 MG CAPS capsule Take 1 capsule (0.4 mg total) by mouth daily. 04/03/21  Yes Fayrene Helperran, [place], PA-C  ibuprofen (ADVIL) 600 MG tablet Take 1 tablet (600 mg total) by mouth every 6 (six) hours as needed. Patient taking differently: Take 600 mg by mouth every 6 (six) hours as needed for mild pain. 04/03/21   Fayrene Helperran, [place], PA-C    Allergies    Bactrim [sulfamethoxazole-trimethoprim] and Ketamine  Review of Systems   Review of Systems  Constitutional: Negative for chills and fever.  Respiratory: Negative for shortness of breath.   Cardiovascular: Negative for chest pain.  Gastrointestinal: Positive for nausea and vomiting. Negative for abdominal pain, blood in stool, constipation and diarrhea.  Genitourinary: Positive for flank pain. Negative for difficulty urinating, dysuria and hematuria.  Musculoskeletal: Positive for back pain.  Neurological: Negative for syncope, weakness and numbness.  All other systems reviewed and are negative.   Physical Exam Updated Vital Signs BP (!) 128/99 (BP Location: Right Arm)   Pulse 85   Temp 98.1 F (36.7 C) (Oral)   Resp 17   Ht 5\' 8"  (1.727 m)   Wt 108.9 kg   SpO2 98%   BMI 36.49 kg/m   Physical  Exam Vitals and nursing note reviewed.  Constitutional:      General: He is not in acute distress.    Appearance: He is well-developed. He is not diaphoretic.  HENT:     Head: Normocephalic and atraumatic.     Mouth/Throat:     Mouth: Mucous membranes are moist.     Pharynx: Oropharynx is clear.  Eyes:     Conjunctiva/sclera: Conjunctivae normal.  Cardiovascular:     Rate and Rhythm: Normal rate and regular rhythm.     Pulses: Normal pulses.  Pulmonary:     Effort: Pulmonary effort is normal. No respiratory distress.     Breath sounds: Normal breath sounds.  Abdominal:     Palpations: Abdomen is soft.     Tenderness: There is no abdominal tenderness. There is left CVA tenderness. There is no guarding.  Musculoskeletal:     Cervical back: Neck supple.  Skin:    General: Skin is warm and dry.  Neurological:     Mental Status: He is alert.  Psychiatric:        Mood and Affect: Mood and affect normal.        Speech: Speech normal.  Behavior: Behavior normal.     ED Results / Procedures / Treatments   Labs (all labs ordered are listed, but only abnormal results are displayed) Labs Reviewed  COMPREHENSIVE METABOLIC PANEL - Abnormal; Notable for the following components:      Result Value   Glucose, Bld 130 (*)    Creatinine, Ser 1.37 (*)    Total Protein 8.4 (*)    All other components within normal limits  CBC WITH DIFFERENTIAL/PLATELET - Abnormal; Notable for the following components:   Neutro Abs 8.4 (*)    All other components within normal limits  URINE CULTURE  RESP PANEL BY RT-PCR (FLU A&B, COVID) ARPGX2  URINALYSIS, ROUTINE W REFLEX MICROSCOPIC    EKG None  Radiology US Renal  Result Date: 04/19/2021 CLINICAL DATA:  Left flank pain. EXAM: RENAL / URINARY TRACT ULTRASOUND COMPLETE COMPARISON:  April 03, 2021 ultrasound FINDINGS: Right Kidney: Renal measurements: 11.3 x 5.8 x 6.4 cm = volume: 216 mL. Echogenicity within normal limits. No mass or  hydronephrosis visualized. Left Kidney: Renal measurements: 15.1 x 8.0 x 7.0 cm = volume: 442 mL. Moderate to severe left hydronephrosis remains, unchanged. Increased cortical echogenicity associated with the left kidney relative to the right. Bladder: Appears normal for degree of bladder distention. Other: None. IMPRESSION: 1. Moderate to severe left hydronephrosis persists, unchanged since April 03, 2021. The mild increased echogenicity in the left renal cortex versus the right is likely due to sequela of obstructive uropathy. 2. The renal stones seen on the CT scan from March 11, 2021 are not visualized on today's ultrasound. Electronically Signed   By: Gerome Sam III M.D   On: 04/19/2021 09:59   CT Renal Stone Study  Result Date: 04/19/2021 CLINICAL DATA:  30 year old male with LEFT flank and abdominal pain. History of urinary calculi. EXAM: CT ABDOMEN AND PELVIS WITHOUT CONTRAST TECHNIQUE: Multidetector CT imaging of the abdomen and pelvis was performed following the standard protocol without IV contrast. COMPARISON:  03/11/2021 FINDINGS: Please note that parenchymal abnormalities may be missed without intravenous contrast. Lower chest: No acute abnormality. Hepatobiliary: Hepatic steatosis again noted without focal hepatic lesion. The gallbladder is unremarkable. No biliary dilatation. Pancreas: Unremarkable Spleen: Unremarkable Adrenals/Urinary Tract: Stable to slightly decreased severe LEFT hydroureteronephrosis (since 03/11/2021) extends to a 3 mm residual mid LEFT ureteral calculus - previously the stone burden measured up to 13 mm. The LEFT ureter distal to this calculus is not distended. Multiple nonobstructing bilateral renal calculi are again noted. No new ureteral calculi are identified. Stomach/Bowel: Stomach is within normal limits. Appendix appears normal. No evidence of bowel wall thickening, distention, or inflammatory changes. Vascular/Lymphatic: No significant vascular findings are  present. No enlarged abdominal or pelvic lymph nodes. Reproductive: Prostate is unremarkable. Other: No ascites, pneumoperitoneum or focal collection. Musculoskeletal: No acute or suspicious bony abnormalities are identified. Lumbar scoliosis again noted. IMPRESSION: 1. 3 mm residual mid LEFT ureteral calculus (previously the stone burden at this site measures 13 mm on 03/11/2021) with stable to slightly decreased severe LEFT hydroureteronephrosis. 2. Multiple nonobstructing bilateral renal calculi. 3. Hepatic steatosis. Electronically Signed   By: Harmon Pier M.D.   On: 04/19/2021 13:24    Procedures Procedures   Medications Ordered in ED Medications  HYDROmorphone (DILAUDID) injection 1 mg (1 mg Intravenous Given 04/19/21 1504)  ondansetron (ZOFRAN) injection 4 mg (has no administration in time range)  sodium chloride 0.9 % bolus 1,000 mL (0 mLs Intravenous Stopped 04/19/21 1010)  ondansetron (ZOFRAN) injection 4 mg (4 mg  Intravenous Given 04/19/21 0938)  ketorolac (TORADOL) 30 MG/ML injection 15 mg (15 mg Intravenous Given 04/19/21 0938)  HYDROmorphone (DILAUDID) injection 1 mg (1 mg Intravenous Given 04/19/21 0940)  HYDROmorphone (DILAUDID) injection 1 mg (1 mg Intravenous Given 04/19/21 1057)  ketorolac (TORADOL) 30 MG/ML injection 15 mg (15 mg Intravenous Given 04/19/21 1055)  sodium chloride 0.9 % bolus 1,000 mL (0 mLs Intravenous Stopped 04/19/21 1130)  fentaNYL (SUBLIMAZE) injection 100 mcg (100 mcg Intravenous Given 04/19/21 1306)  tamsulosin (FLOMAX) capsule 0.4 mg (0.4 mg Oral Given 04/19/21 1354)  HYDROmorphone (DILAUDID) injection 1 mg (1 mg Intravenous Given 04/19/21 1358)    ED Course  I have reviewed the triage vital signs and the nursing notes.  Pertinent labs & imaging results that were available during my care of the patient were reviewed by me and considered in my medical decision making (see chart for details).  Clinical Course as of 04/19/21 1533  Sun Apr 19, 2021  1136 Spoke with  Patience Musca, urology resident.  Discussed patient's symptoms and recent history.  She states she will reviewed the patient's chart and call back. [SJ]  1153 Alysen requests CT renal stone study. [SJ]  1347 Updated Alysen on CT renal stone study results.  Requests we give the patient a dose of Flomax. She will discuss the patient's case with the attending urologist, Dr. Berneice Heinrich. [SJ]  1436 Alysen called back. States they will plan on placing bilateral stents in the patient today. [SJ]    Clinical Course User Index [SJ] Margarito Dehaas C, PA-C   MDM Rules/Calculators/A&P                          Patient presents for recurrence of left flank and lower back pain. Patient is nontoxic appearing, afebrile, not tachycardic, not tachypneic, not hypotensive, maintains excellent SPO2 on room air.  I have reviewed the patient's chart to obtain more information.   I reviewed and interpreted the patient's labs and radiological studies. Ultrasound with moderate to severe hydronephrosis on the left. CT with severe hydronephrosis and 3 mm stone in the mid left ureter. Patient taken back to the OR for bilateral stent placement.    Final Clinical Impression(s) / ED Diagnoses Final diagnoses:  Left ureteral stone  Ureteral stone with hydronephrosis    Rx / DC Orders ED Discharge Orders    None       Concepcion Living 04/19/21 1534    Lorre Nick, MD 04/21/21 1012

## 2021-04-19 NOTE — ED Notes (Signed)
Pt informed we need urine sample. Pt unable to urinate at this time. Urinal placed bedside

## 2021-04-19 NOTE — Op Note (Signed)
NAME: Thomas Howell, Thomas Howell MEDICAL RECORD NO: [id number] ACCOUNT NO: [id number] DATE OF BIRTH: June 28, 1991 FACILITY: [facility] LOCATION: [facility] PHYSICIAN: Sebastian Ache, MD  Operative Report   DATE OF PROCEDURE: 04/19/2021  PREOPERATIVE DIAGNOSIS:  Left ureteral, bilateral renal stones with refractory colic.  PROCEDURE PERFORMED: 1.  Cystoscopy, bilateral retrograde pyelogram, interpretation. 2.  Insertion of bilateral ureteral stents, 5 x 26 Polaris, no tether.  ESTIMATED BLOOD LOSS:  Nil.  COMPLICATIONS:  None.  SPECIMENS:  None.  FINDINGS:  1.  Severe left hydronephrosis with filling defect in the mid ureter consistent with a known stone. 2.  Successful placement of left ureteral stent, proximal end in renal pelvis, distal end in urinary bladder. 3.  Unremarkable right retrograde pyelogram. 4.  Insertion of right ureteral stent, proximal end in renal pelvis, distal end in urinary bladder.  INDICATIONS FOR PROCEDURE:  The patient is a pleasant but unfortunate 30 year old young man with a long history of recurrent urolithiasis.  He is on medical therapy for this, but continues to have occasional recurrences.  He was found on workup for  colicky flank pain to have some residual left hydronephrosis several weeks ago, it is unclear if this is postoperative in nature versus a new ureteral stone, was given appropriate trial of medical therapy.  He presented today to the emergency room with  more refractory symptoms and actually imaging was performed, which revealed a left mid ureteral stone as well as significant volume left greater than right intrarenal stones.  Options were discussed for management including continued medical therapy  versus shockwave lithotripsy versus path of ureteroscopy with stone free and he wished to proceed with the latter with stenting today to allow for renal decompression and ureteral dilation and then bilateral ureteroscopy with goal of stone free in the  later  setting.  Informed consent was obtained and placed in the medical record.  PROCEDURE IN DETAIL:  The patient being verified, procedure being cystoscopy, bilateral stent placement confirmed.  Procedure time-out was performed.  Intravenous antibiotics administered. General LMA anesthesia induced.  The patient was placed into a  low lithotomy position.  Sterile field was created, prepped and draped the patient's penis, perineum and proximal thighs and cystourethroscopy was performed using 21-French rigid cystoscope with offset lens.  Inspection of anterior and posterior urethra  were unremarkable.  Inspection of the urinary bladder revealed no diverticula, calcifications or papillary lesions.  The left ureteral orifice was cannulated with a 6-French renal catheter and left retrograde pyelogram was obtained.  Left retrograde pyelogram did show a single left ureter with single system left kidney.  There is a filling defect in the mid ureter consistent with known stone.  There was moderate to severe hydronephrosis above this.  A 0.038 ZIPwire was advanced to  the level of the upper pole and a new 5 x 26 Polaris type stent was placed using fluoroscopic guidance and good proximal and distal plane were noted.  Next, the right retrograde pyelogram was obtained.  Right retrograde pyelogram did show a single right ureter with single system right kidney.  No filling defects or narrowing noted.  The ZIPwire was advanced to the level of the upper pole and a separate 5 x 26 Polaris type stent was placed over the right  side using fluoroscopic guidance and good proximal and distal plane were noted.  Bladder was emptied per cystoscope.  Procedure was then terminated.  The patient tolerated procedure well, no immediate perioperative complications.  The patient was taken  to postanesthesia  care in stable condition.  We will plan for discharge home and scheduling bilateral ureteroscopy in elective setting in several  weeks.   SHW D: 04/19/2021 4:08:44 pm T: 04/19/2021 11:38:00 pm  JOB: [id number]/ [id number]

## 2021-04-19 NOTE — Transfer of Care (Signed)
Immediate Anesthesia Transfer of Care Note  Patient: Thomas Howell  Procedure(s) Performed: CYSTOSCOPY WITH RETROGRADE PYELOGRAM/URETERAL STENT PLACEMENT (Bilateral Ureter)  Patient Location: PACU  Anesthesia Type:General  Level of Consciousness: awake and patient cooperative  Airway & Oxygen Therapy: Patient Spontanous Breathing and Patient connected to face mask  Post-op Assessment: Report given to RN and Post -op Vital signs reviewed and stable  Post vital signs: Reviewed and stable  Last Vitals:  Vitals Value Taken Time  BP 144/82 04/19/21 1617  Temp    Pulse 106 04/19/21 1619  Resp 17 04/19/21 1619  SpO2 100 % 04/19/21 1619  Vitals shown include unvalidated device data.  Last Pain:  Vitals:   04/19/21 1450  TempSrc:   PainSc: 7          Complications: No complications documented.

## 2021-04-19 NOTE — Brief Op Note (Signed)
04/19/2021  4:04 PM  PATIENT:  Thomas Howell  30 y.o. male  PRE-OPERATIVE DIAGNOSIS:  BILATERAL URETERAL STONES  POST-OPERATIVE DIAGNOSIS:  BILATERAL URETERAL STONES  PROCEDURE:  Procedure(s): CYSTOSCOPY WITH RETROGRADE PYELOGRAM/URETERAL STENT PLACEMENT (Bilateral)  SURGEON:  Surgeon(s) and Role:    * Sebastian Ache, MD - Primary  PHYSICIAN ASSISTANT:   ASSISTANTS: none   ANESTHESIA:   general  EBL:  minimal   BLOOD ADMINISTERED:none  DRAINS: none   LOCAL MEDICATIONS USED:  NONE  SPECIMEN:  No Specimen  DISPOSITION OF SPECIMEN:  N/A  COUNTS:  YES  TOURNIQUET:  * No tourniquets in log *  DICTATION: .Other Dictation: Dictation Number [id number]  PLAN OF CARE: Discharge to home after PACU  PATIENT DISPOSITION:  PACU - hemodynamically stable.   Delay start of Pharmacological VTE agent (>24hrs) due to surgical blood loss or risk of bleeding: not applicable

## 2021-04-19 NOTE — ED Notes (Signed)
OR here to take pt

## 2021-04-19 NOTE — H&P (Signed)
Urology   Reason for consult: obstructing stone  History of Present Illness: Thomas Howell is a 30 y.o. with longstanding history of nephrolithiasis requiring many previous stone procedures who presents with acute onset left flank pain found to have 3mm obstructing left mid ureteral stone as well as multiple bilateral renal stones. He last had a stone procedure about 6wks ago and his post operative stent was removed about 3 weeks ago. He presented for similar symptoms 2 weeks ago and RUS at the time showed hydronephrosis but no CT was done and given his pain was controlled he was sent home and has been fine until pain recurred on the same side overnight. He denies associated fevers, chills, dysuria, hematuria. In ED his pain has remained uncontrolled. His Cr remains elevated at 1.37. No leukocytosis. UA without concern for infection.   Past Medical History:  Diagnosis Date  . Complication of anesthesia   . Frequency-urgency syndrome   . History of kidney stones   . Hx of nausea and vomiting    d/t kidney stone  . Hx of sepsis 08/11/2017   due to kidney stone/ hydronephrosis  . Left ureteral stone   . Migraine   . Pain due to ureteral stent (HCC)   . PONV (postoperative nausea and vomiting)    none recently  . Renal calculi    bilateral per ct 07-26-2016  . Scoliosis of lumbar spine 1994   treated at [facility] until age 30  . Wears glasses     Past Surgical History:  Procedure Laterality Date  . CYSTOSCOPY W/ RETROGRADES Right 07/06/2015   Procedure: CYSTOSCOPY WITH RETROGRADE PYELOGRAM;  Surgeon: Jerilee FieldMatthew Eskridge, MD;  Location: [facility];  Service: Urology;  Laterality: Right;  . CYSTOSCOPY W/ URETERAL STENT PLACEMENT Left 08/08/2015   Procedure: CYSTOSCOPY WITH STENT REPLACEMENT;  Surgeon: Jerilee FieldMatthew Eskridge, MD;  [facility];  Service: Urology;  Laterality: Left;  . CYSTOSCOPY W/ URETERAL STENT PLACEMENT Left 02/21/2017   Procedure: CYSTOSCOPY WITH RETROGRADE  PYELOGRAM/URETERAL LEFT STENT PLACEMENT WITH  LASER;  Surgeon: Bjorn PippinJohn Wrenn, MD;  Location: [facility];  Service: Urology;  Laterality: Left;  . CYSTOSCOPY W/ URETERAL STENT PLACEMENT Left 08/10/2017   Procedure: CYSTOSCOPY WITH RETROGRADE PYELOGRAM/URETERAL STENT PLACEMENT;  Surgeon: Heloise PurpuraBorden, Lester, MD;  Location: [facility];  Service: Urology;  Laterality: Left;  . CYSTOSCOPY WITH RETROGRADE PYELOGRAM, URETEROSCOPY AND STENT PLACEMENT Left 06/24/2014   Procedure: CYSTOSCOPY WITH RETROGRADE PYELOGRAM,  AND STENT PLACEMENT;  Surgeon: Magdalene Mollyaniel Y Woodruff, MD;  Location: [facility];  Service: Urology;  Laterality: Left;  . CYSTOSCOPY WITH RETROGRADE PYELOGRAM, URETEROSCOPY AND STENT PLACEMENT Left 07/05/2014   Procedure: CYSTO/LEFT URETEROSCOPY/LEFT RETROGRADE PYELOGRAM/LEFT STENT PLACEMENT;  Surgeon: Jerilee FieldMatthew Eskridge, MD;  [facility];  Service: Urology;  Laterality: Left;  . CYSTOSCOPY WITH RETROGRADE PYELOGRAM, URETEROSCOPY AND STENT PLACEMENT Left 07/06/2015   Procedure: CYSTOSCOPY WITH RETROGRADE PYELOGRAM, URETEROSCOPY , LASER, STENT PLACEMENT and BASKET EXTRACTION;  Surgeon: Jerilee FieldMatthew Eskridge, MD;  Location[facility];  Service: Urology;  Laterality: Left;  . CYSTOSCOPY WITH RETROGRADE PYELOGRAM, URETEROSCOPY AND STENT PLACEMENT Left 08/19/2015   Procedure: CYSTOSCOPY WITH RETROGRADE PYELOGRAM, URETEROSCOPY AND STENT PLACEMENT;  Surgeon: Jerilee FieldMatthew Eskridge, MD;  Location[facility];  Service: Urology;  Laterality: Left;  . CYSTOSCOPY WITH RETROGRADE PYELOGRAM, URETEROSCOPY AND STENT PLACEMENT Left 03/13/2018   Procedure: CYSTOSCOPY WITH RETROGRADE PYELOGRAM, URETEROSCOPY AND LEFT STENT PLACEMENT;  Surgeon: Bjorn PippinWrenn, John, MD;  Location: [facility];  Service: Urology;  Laterality: Left;  . CYSTOSCOPY WITH  RETROGRADE PYELOGRAM, URETEROSCOPY AND STENT PLACEMENT Left 03/11/2021   Procedure: CYSTOSCOPY WITH RETROGRADE PYELOGRAM, URETEROSCOPY AND STENT PLACEMENT,STONE EXTRACTION,HOLMIUM LASER;  Surgeon: Marcine Matar, MD;  Location[facility];  Service: Urology;  Laterality: Left;  . CYSTOSCOPY WITH URETEROSCOPY AND STENT PLACEMENT Left 01/16/2016   Procedure: CYSTOSCOPY WITH LEFT RETROGRADE PYELOGRAM  LEFT DIGITAL URETEROSCOPY AND PLACEMENT LEFT URETERAL STENT;  Surgeon: Jerilee Field, MD;  Location[facility];  Service: Urology;  Laterality: Left;  . CYSTOSCOPY WITH URETEROSCOPY, STONE BASKETRY AND STENT PLACEMENT Left 02/13/2016   Procedure: CYSTOSCOPY WITH LEFT URETEROSCOPY, HOLMIUM LASER AND STENT PLACEMENT;  Surgeon: Jerilee Field, MD;  Location[facility];  Service: Urology;  Laterality: Left;  . CYSTOSCOPY/RETROGRADE/URETEROSCOPY/STONE EXTRACTION WITH BASKET Left 08/08/2015   Procedure: CYSTOSCOPY/RETROGRADE/URETEROSCOPY/STONE EXTRACTION WITH BASKET;  Surgeon: Jerilee Field, MD;  [facility];  Service: Urology;  Laterality: Left;  . CYSTOSCOPY/URETEROSCOPY/HOLMIUM LASER/STENT PLACEMENT Left 07/30/2016   Procedure: CYSTOSCOPY/URETEROSCOPY/HOLMIUM LASER/STENT PLACEMENT;  Surgeon: Jerilee Field, MD;  [facility];  Service: Urology;  Laterality: Left;  . CYSTOSCOPY/URETEROSCOPY/HOLMIUM LASER/STENT PLACEMENT Left 08/19/2017   Procedure: CYSTOSCOPY/URETEROSCOPY/HOLMIUM LASER/STENT PLACEMENT;  Surgeon: Jerilee Field, MD;  [facility];  Service: Urology;  Laterality: Left;  . CYSTOSCOPY/URETEROSCOPY/HOLMIUM LASER/STENT PLACEMENT N/A 10/13/2018   Procedure: CYSTOSCOPY/RETROGRADE RIGHT URETEROSCOPY/ AND RIGHTSTENT PLACEMENT;  Surgeon: Bjorn Pippin, MD;  Location: [facility];  Service: Urology;  Laterality: N/A;  . EXTRACORPOREAL SHOCK WAVE LITHOTRIPSY Left 07-07-2015  &  12-26-2014  . 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 HOLMIUM LASER APPLICATION Left 07/05/2014   Procedure: LASER LITHO;  Surgeon: Jerilee Field, MD;  [facility];  Service:  Urology;  Laterality: Left;  . HOLMIUM LASER APPLICATION Left 08/08/2015   Procedure: HOLMIUM LASER  WITH LITHOTRIPSY ;  Surgeon: Jerilee Field, MD;  Location: [facility];  Service: Urology;  Laterality: Left;  . HOLMIUM LASER APPLICATION Left 02/21/2017   Procedure: HOLMIUM LASER APPLICATION;  Surgeon: Bjorn Pippin, MD;  Location[facility];  Service: Urology;  Laterality: Left;  . HOLMIUM LASER APPLICATION Left 03/13/2018   Procedure: HOLMIUM LASER APPLICATION;  Surgeon: Bjorn Pippin, MD;  Location[facility];  Service: Urology;  Laterality: Left;  . LYMPH GLAND EXCISION  2003   neck--  benign  . SVT ABLATION N/A 05/22/2018   Procedure: SVT ABLATION;  Surgeon: Marinus Maw, MD;  [facility];  Service: Cardiovascular;  Laterality: N/A;    Current Hospital Medications:  Home Meds:  No current facility-administered medications on file prior to encounter.   Current Outpatient Medications on File Prior to Encounter  Medication Sig Dispense Refill  . aspirin-acetaminophen-caffeine (EXCEDRIN MIGRAINE) 250-250-65 MG tablet Take 2 tablets by mouth every 6 (six) hours as needed for headache.    . hydrochlorothiazide (HYDRODIURIL) 25 MG tablet Take 25 mg by mouth daily.     . rizatriptan (MAXALT-MLT) 10 MG disintegrating tablet TAKE 1 TABLET BY MOUTH AS NEEDED MAY REPEAT IN 2 HOURS IF NEEDED (Patient taking differently: Take 10 mg by mouth daily as needed for migraine. May repeat in 2 hrs if needed) 10 tablet 5  . tamsulosin (FLOMAX) 0.4 MG CAPS capsule Take 1 capsule (0.4 mg total) by mouth daily. 7 capsule 0  . ibuprofen (ADVIL) 600 MG tablet Take 1 tablet (600 mg total) by mouth every 6 (six) hours as needed. (Patient taking differently: Take 600 mg by mouth every 6 (  six) hours as needed for mild pain.) 30 tablet 0     Scheduled Meds: Continuous Infusions: PRN Meds:.HYDROmorphone (DILAUDID) injection, ondansetron (ZOFRAN) IV  Allergies:  Allergies  Allergen  Reactions  . Bactrim [Sulfamethoxazole-Trimethoprim] Nausea And Vomiting  . Ketamine Other (See Comments)    Did not like the sensation    Family History  Problem Relation Age of Onset  . Cancer Father   . Kidney Stones Mother   . Cancer Other   . Hypertension Other   . Hyperlipidemia Other   . Stroke Other   . Kidney Stones Brother     Social History:  reports that he quit smoking about 6 years ago. He quit after 2.00 years of use. He has never used smokeless tobacco. He reports current alcohol use. He reports that he does not use drugs.  ROS: A complete review of systems was performed.  All systems are negative except for pertinent findings as noted.  Physical Exam:  Vital signs in last 24 hours: Temp:  [98.1 F (36.7 C)] 98.1 F (36.7 C) (05/01 0847) Pulse Rate:  [61-85] 74 (05/01 1430) Resp:  [15-18] 16 (05/01 1430) BP: (98-128)/(65-99) 117/82 (05/01 1430) SpO2:  [96 %-100 %] 96 % (05/01 1430) Weight:  [108.9 kg] 108.9 kg (05/01 0849) Constitutional:  Alert and oriented, No acute distress Cardiovascular: Regular rate and rhythm, No JVD Respiratory: Normal respiratory effort, Lungs clear bilaterally GI: Abdomen is soft, nontender, nondistended, no abdominal masses GU: mild left CVA tenderness Lymphatic: No lymphadenopathy Neurologic: Grossly intact, no focal deficits Psychiatric: Normal mood and affect  Laboratory Data:  Recent Labs    04/19/21 0943  WBC 9.6  HGB 15.5  HCT 44.3  PLT 219    Recent Labs    04/19/21 0943  NA 139  K 3.7  CL 102  GLUCOSE 130*  BUN 18  CALCIUM 10.1  CREATININE 1.37*     Results for orders placed or performed during the hospital encounter of 04/19/21 (from the past 24 hour(s))  Comprehensive metabolic panel     Status: Abnormal   Collection Time: 04/19/21  9:43 AM  Result Value Ref Range   Sodium 139 135 - 145 mmol/L   Potassium 3.7 3.5 - 5.1 mmol/L   Chloride 102 98 - 111 mmol/L   CO2 25 22 - 32 mmol/L   Glucose,  Bld 130 (H) 70 - 99 mg/dL   BUN 18 6 - 20 mg/dL   Creatinine, Ser 0.86 (H) 0.61 - 1.24 mg/dL   Calcium 76.1 8.9 - 95.0 mg/dL   Total Protein 8.4 (H) 6.5 - 8.1 g/dL   Albumin 4.8 3.5 - 5.0 g/dL   AST 32 15 - 41 U/L   ALT 30 0 - 44 U/L   Alkaline Phosphatase 64 38 - 126 U/L   Total Bilirubin 0.6 0.3 - 1.2 mg/dL   GFR, Estimated >93 >26 mL/min   Anion gap 12 5 - 15  CBC with Differential     Status: Abnormal   Collection Time: 04/19/21  9:43 AM  Result Value Ref Range   WBC 9.6 4.0 - 10.5 K/uL   RBC 5.07 4.22 - 5.81 MIL/uL   Hemoglobin 15.5 13.0 - 17.0 g/dL   HCT 71.2 45.8 - 09.9 %   MCV 87.4 80.0 - 100.0 fL   MCH 30.6 26.0 - 34.0 pg   MCHC 35.0 30.0 - 36.0 g/dL   RDW 83.3 82.5 - 05.3 %   Platelets 219 150 - 400 K/uL  nRBC 0.0 0.0 - 0.2 %   Neutrophils Relative % 88 %   Neutro Abs 8.4 (H) 1.7 - 7.7 K/uL   Lymphocytes Relative 9 %   Lymphs Abs 0.8 0.7 - 4.0 K/uL   Monocytes Relative 3 %   Monocytes Absolute 0.3 0.1 - 1.0 K/uL   Eosinophils Relative 0 %   Eosinophils Absolute 0.0 0.0 - 0.5 K/uL   Basophils Relative 0 %   Basophils Absolute 0.0 0.0 - 0.1 K/uL   Immature Granulocytes 0 %   Abs Immature Granulocytes 0.02 0.00 - 0.07 K/uL  Urinalysis, Routine w reflex microscopic Urine, Clean Catch     Status: None   Collection Time: 04/19/21 10:51 AM  Result Value Ref Range   Color, Urine YELLOW YELLOW   APPearance CLEAR CLEAR   Specific Gravity, Urine 1.020 1.005 - 1.030   pH 8.0 5.0 - 8.0   Glucose, UA NEGATIVE NEGATIVE mg/dL   Hgb urine dipstick NEGATIVE NEGATIVE   Bilirubin Urine NEGATIVE NEGATIVE   Ketones, ur NEGATIVE NEGATIVE mg/dL   Protein, ur NEGATIVE NEGATIVE mg/dL   Nitrite NEGATIVE NEGATIVE   Leukocytes,Ua NEGATIVE NEGATIVE   No results found for this or any previous visit (from the past 240 hour(s)).  Renal Function: Recent Labs    04/19/21 0943  CREATININE 1.37*   Estimated Creatinine Clearance: 96.1 mL/min (A) (by C-G formula based on SCr of 1.37  mg/dL (H)).  Radiologic Imaging: US Renal  Result Date: 04/19/2021 CLINICAL DATA:  Left flank pain. EXAM: RENAL / URINARY TRACT ULTRASOUND COMPLETE COMPARISON:  April 03, 2021 ultrasound FINDINGS: Right Kidney: Renal measurements: 11.3 x 5.8 x 6.4 cm = volume: 216 mL. Echogenicity within normal limits. No mass or hydronephrosis visualized. Left Kidney: Renal measurements: 15.1 x 8.0 x 7.0 cm = volume: 442 mL. Moderate to severe left hydronephrosis remains, unchanged. Increased cortical echogenicity associated with the left kidney relative to the right. Bladder: Appears normal for degree of bladder distention. Other: None. IMPRESSION: 1. Moderate to severe left hydronephrosis persists, unchanged since April 03, 2021. The mild increased echogenicity in the left renal cortex versus the right is likely due to sequela of obstructive uropathy. 2. The renal stones seen on the CT scan from March 11, 2021 are not visualized on today's ultrasound. Electronically Signed   By: Gerome Sam III M.D   On: 04/19/2021 09:59   CT Renal Stone Study  Result Date: 04/19/2021 CLINICAL DATA:  30 year old male with LEFT flank and abdominal pain. History of urinary calculi. EXAM: CT ABDOMEN AND PELVIS WITHOUT CONTRAST TECHNIQUE: Multidetector CT imaging of the abdomen and pelvis was performed following the standard protocol without IV contrast. COMPARISON:  03/11/2021 FINDINGS: Please note that parenchymal abnormalities may be missed without intravenous contrast. Lower chest: No acute abnormality. Hepatobiliary: Hepatic steatosis again noted without focal hepatic lesion. The gallbladder is unremarkable. No biliary dilatation. Pancreas: Unremarkable Spleen: Unremarkable Adrenals/Urinary Tract: Stable to slightly decreased severe LEFT hydroureteronephrosis (since 03/11/2021) extends to a 3 mm residual mid LEFT ureteral calculus - previously the stone burden measured up to 13 mm. The LEFT ureter distal to this calculus is not  distended. Multiple nonobstructing bilateral renal calculi are again noted. No new ureteral calculi are identified. Stomach/Bowel: Stomach is within normal limits. Appendix appears normal. No evidence of bowel wall thickening, distention, or inflammatory changes. Vascular/Lymphatic: No significant vascular findings are present. No enlarged abdominal or pelvic lymph nodes. Reproductive: Prostate is unremarkable. Other: No ascites, pneumoperitoneum or focal collection. Musculoskeletal: No acute  or suspicious bony abnormalities are identified. Lumbar scoliosis again noted. IMPRESSION: 1. 3 mm residual mid LEFT ureteral calculus (previously the stone burden at this site measures 13 mm on 03/11/2021) with stable to slightly decreased severe LEFT hydroureteronephrosis. 2. Multiple nonobstructing bilateral renal calculi. 3. Hepatic steatosis. Electronically Signed   By: Harmon Pier M.D.   On: 04/19/2021 13:24    I independently reviewed the above imaging studies.  Impression/Recommendation 30yo M with longstanding history of recurrent nephrolithiasis requiring multiple procedures in the past who presents with 3mm obstructing stone and multiple nonobstructing bilateral stones. No concern for infection but pain is uncontrolled and he has continued AKI.  We had a long and thoughtful discussion regarding several different options for management at this time, however patient would like to be stented bilaterally in hopes that his pain can be helped in the short term and so that he can undergo bilateral definitive stone procedure in the next 1-2 weeks. He has required pre-stenting and passive dilation several times in the past so this would allow our best chance at getting him stone free with an outpatient definitive stone procedure. Risks and benefits were discussed. Plan for OR today for cystourethroscopy, bilateral ureteral stents, patient can likely be discharged following stent placement.   Simone Rodenbeck 04/19/2021,  3:02 PM

## 2021-04-20 ENCOUNTER — Encounter (HOSPITAL_COMMUNITY): Payer: Self-pay | Admitting: Urology

## 2021-04-20 LAB — URINE CULTURE: Culture: NO GROWTH

## 2021-04-20 NOTE — Anesthesia Postprocedure Evaluation (Signed)
Anesthesia Post Note  Patient: Thomas Howell  Procedure(s) Performed: CYSTOSCOPY WITH RETROGRADE PYELOGRAM/URETERAL STENT PLACEMENT (Bilateral Ureter)     Patient location during evaluation: PACU Anesthesia Type: General Level of consciousness: awake and alert Pain management: pain level controlled Vital Signs Assessment: post-procedure vital signs reviewed and stable Respiratory status: spontaneous breathing, nonlabored ventilation and respiratory function stable Cardiovascular status: blood pressure returned to baseline and stable Postop Assessment: no apparent nausea or vomiting Anesthetic complications: no   No complications documented.  Last Vitals:  Vitals:   04/19/21 1640 04/19/21 1645  BP:  (!) 147/86  Pulse: 89 85  Resp: (!) 21 13  Temp:    SpO2: 95% 94%    Last Pain:  Vitals:   04/19/21 1630  TempSrc:   PainSc: 2                  Mellody Dance

## 2021-04-24 ENCOUNTER — Other Ambulatory Visit: Payer: Self-pay | Admitting: Urology

## 2021-04-30 NOTE — Progress Notes (Signed)
DUE TO COVID-19 ONLY ONE VISITOR IS ALLOWED TO COME WITH YOU AND STAY IN THE WAITING ROOM ONLY DURING PRE OP AND PROCEDURE DAY OF SURGERY. THE 1 VISITOR  MAY VISIT WITH YOU AFTER SURGERY IN YOUR PRIVATE ROOM DURING VISITING HOURS ONLY!  YOU NEED TO HAVE A COVID 19 TEST ON_______ @_______ , THIS TEST MUST BE DONE BEFORE SURGERY,  COVID TESTING SITE [address] [place] , IT IS ON THE RIGHT GOING OUT [address] APPROXIMATELY  2 MINUTES PAST [employer] ON THE RIGHT. ONCE YOUR COVID TEST IS COMPLETED,  PLEASE BEGIN THE QUARANTINE INSTRUCTIONS AS OUTLINED IN YOUR HANDOUT.                Thomas Howell  04/30/2021   Your procedure is scheduled on:            05/06/2021   Report to [facility] Main  Entrance   Report to admitting at     0630am     Call this number if you have problems the morning of surgery [phone]    Remember: Do not eat food , candy gum or mints :After Midnight. You may have clear liquids from midnight until 0530am     CLEAR LIQUID DIET   Foods Allowed                                                                       Coffee and tea, regular and decaf                              Plain Jell-O any favor except red or purple                                            Fruit ices (not with fruit pulp)                                      Iced Popsicles                                     Carbonated beverages, regular and diet                                    Cranberry, grape and apple juices Sports drinks like Gatorade Lightly seasoned clear broth or consume(fat free) Sugar, honey syrup   _____________________________________________________________________    BRUSH YOUR TEETH MORNING OF SURGERY AND RINSE YOUR MOUTH OUT, NO CHEWING GUM CANDY OR MINTS.     Take these medicines the morning of surgery with A SIP OF WATER:        flomax  DO NOT TAKE ANY DIABETIC MEDICATIONS DAY OF YOUR SURGERY  You may not have any metal on your body including hair pins and              piercings  Do not wear jewelry, make-up, lotions, powders or perfumes, deodorant             Do not wear nail polish on your fingernails.  Do not shave  48 hours prior to surgery.              Men may shave face and neck.   Do not bring valuables to the hospital. [place].  Contacts, dentures or bridgework may not be worn into surgery.  Leave suitcase in the car. After surgery it may be brought to your room.     Patients discharged the day of surgery will not be allowed to drive home. IF YOU ARE HAVING SURGERY AND GOING HOME THE SAME DAY, YOU MUST HAVE AN ADULT TO DRIVE YOU HOME AND BE WITH YOU FOR 24 HOURS. YOU MAY GO HOME BY TAXI OR UBER OR ORTHERWISE, BUT AN ADULT MUST ACCOMPANY YOU HOME AND STAY WITH YOU FOR 24 HOURS.  Name and phone number of your driver:  Special Instructions: N/A              Please read over the following fact sheets you were given: _____________________________________________________________________  [facility] - Preparing for Surgery Before surgery, you can play an important role.  Because skin is not sterile, your skin needs to be as free of germs as possible.  You can reduce the number of germs on your skin by washing with CHG (chlorahexidine gluconate) soap before surgery.  CHG is an antiseptic cleaner which kills germs and bonds with the skin to continue killing germs even after washing. Please DO NOT use if you have an allergy to CHG or antibacterial soaps.  If your skin becomes reddened/irritated stop using the CHG and inform your nurse when you arrive at Short Stay. Do not shave (including legs and underarms) for at least 48 hours prior to the first CHG shower.  You may shave your face/neck. Please follow these instructions carefully:  1.  Shower with CHG Soap the night before surgery and the  morning of Surgery.  2.  If  you choose to wash your hair, wash your hair first as usual with your  normal  shampoo.  3.  After you shampoo, rinse your hair and body thoroughly to remove the  shampoo.                           4.  Use CHG as you would any other liquid soap.  You can apply chg directly  to the skin and wash                       Gently with a scrungie or clean washcloth.  5.  Apply the CHG Soap to your body ONLY FROM THE NECK DOWN.   Do not use on face/ open                           Wound or open sores. Avoid contact with eyes, ears mouth and genitals (private parts).  Wash face,  Genitals (private parts) with your normal soap.             6.  Wash thoroughly, paying special attention to the area where your surgery  will be performed.  7.  Thoroughly rinse your body with warm water from the neck down.  8.  DO NOT shower/wash with your normal soap after using and rinsing off  the CHG Soap.                9.  Pat yourself dry with a clean towel.            10.  Wear clean pajamas.            11.  Place clean sheets on your bed the night of your first shower and do not  sleep with pets. Day of Surgery : Do not apply any lotions/deodorants the morning of surgery.  Please wear clean clothes to the hospital/surgery center.  FAILURE TO FOLLOW THESE INSTRUCTIONS MAY RESULT IN THE CANCELLATION OF YOUR SURGERY PATIENT SIGNATURE_________________________________  NURSE SIGNATURE__________________________________  ________________________________________________________________________

## 2021-04-30 NOTE — Progress Notes (Signed)
Need orders in epic.  Surgery on 05/06/21.  Preop on 05/04/2021.

## 2021-05-04 ENCOUNTER — Other Ambulatory Visit: Payer: Self-pay

## 2021-05-04 ENCOUNTER — Encounter (HOSPITAL_COMMUNITY)
Admission: RE | Admit: 2021-05-04 | Discharge: 2021-05-04 | Disposition: A | Discharge status: Home / Self Care | Payer: BC Managed Care – PPO | Source: Ambulatory Visit | Attending: Urology | Admitting: Urology

## 2021-05-04 ENCOUNTER — Encounter (HOSPITAL_COMMUNITY): Payer: Self-pay

## 2021-05-04 ENCOUNTER — Other Ambulatory Visit (HOSPITAL_COMMUNITY)
Admission: RE | Admit: 2021-05-04 | Discharge: 2021-05-04 | Disposition: A | Discharge status: Home / Self Care | Payer: BC Managed Care – PPO | Source: Ambulatory Visit | Attending: Urology | Admitting: Urology

## 2021-05-04 DIAGNOSIS — Z01812 Encounter for preprocedural laboratory examination: Secondary | ICD-10-CM | POA: Insufficient documentation

## 2021-05-04 DIAGNOSIS — Z888 Allergy status to other drugs, medicaments and biological substances status: Secondary | ICD-10-CM | POA: Diagnosis not present

## 2021-05-04 DIAGNOSIS — Z20822 Contact with and (suspected) exposure to covid-19: Secondary | ICD-10-CM | POA: Insufficient documentation

## 2021-05-04 DIAGNOSIS — Z87891 Personal history of nicotine dependence: Secondary | ICD-10-CM | POA: Diagnosis not present

## 2021-05-04 DIAGNOSIS — Z841 Family history of disorders of kidney and ureter: Secondary | ICD-10-CM | POA: Diagnosis not present

## 2021-05-04 DIAGNOSIS — Z882 Allergy status to sulfonamides status: Secondary | ICD-10-CM | POA: Diagnosis not present

## 2021-05-04 DIAGNOSIS — Z01818 Encounter for other preprocedural examination: Secondary | ICD-10-CM | POA: Insufficient documentation

## 2021-05-04 DIAGNOSIS — N131 Hydronephrosis with ureteral stricture, not elsewhere classified: Secondary | ICD-10-CM | POA: Diagnosis not present

## 2021-05-04 DIAGNOSIS — N200 Calculus of kidney: Secondary | ICD-10-CM | POA: Diagnosis not present

## 2021-05-04 DIAGNOSIS — Z79899 Other long term (current) drug therapy: Secondary | ICD-10-CM | POA: Diagnosis not present

## 2021-05-04 HISTORY — DX: Cardiac arrhythmia, unspecified: I49.9

## 2021-05-04 LAB — CBC
HCT: 41.8 % (ref 39.0–52.0)
Hemoglobin: 14.2 g/dL (ref 13.0–17.0)
MCH: 30.2 pg (ref 26.0–34.0)
MCHC: 34 g/dL (ref 30.0–36.0)
MCV: 88.9 fL (ref 80.0–100.0)
Platelets: 216 10*3/uL (ref 150–400)
RBC: 4.7 MIL/uL (ref 4.22–5.81)
RDW: 11.8 % (ref 11.5–15.5)
WBC: 6.2 10*3/uL (ref 4.0–10.5)
nRBC: 0 % (ref 0.0–0.2)

## 2021-05-04 LAB — BASIC METABOLIC PANEL
Anion gap: 7 (ref 5–15)
BUN: 15 mg/dL (ref 6–20)
CO2: 26 mmol/L (ref 22–32)
Calcium: 9.4 mg/dL (ref 8.9–10.3)
Chloride: 105 mmol/L (ref 98–111)
Creatinine, Ser: 1.18 mg/dL (ref 0.61–1.24)
GFR, Estimated: >60 mL/min (ref >60)
Glucose, Bld: 103 mg/dL — ABNORMAL HIGH (ref 70–99)
Potassium: 3.6 mmol/L (ref 3.5–5.1)
Sodium: 138 mmol/L (ref 135–145)

## 2021-05-04 NOTE — Progress Notes (Addendum)
Anesthesia Review:  PCP: Malena Taylor,DO  LOV 06/05/2020  Cardiologist :  DR Lewayne Bunting- LOV 06/19/2018  Chest x-ray : EKG : 05/04/21 Echo : Stress test: Cardiac Cath :  Activity level: can do a flight of stairs without difficulty  Sleep Study/ CPAP : none  Fasting Blood Sugar :      / Checks Blood Sugar -- times a day:   Blood Thinner/ Instructions /Last Dose: ASA / Instructions/ Last Dose :  Hx of SVT ablation in 2019 by DR Lewayne Bunting.  Dismissed by cardiology per pt .  Pt is an [employer] fund[employer].

## 2021-05-05 LAB — SARS CORONAVIRUS 2 (TAT 6-24 HRS): SARS Coronavirus 2: NEGATIVE

## 2021-05-05 MED ORDER — GENTAMICIN SULFATE 40 MG/ML IJ SOLN
420.0000 mg | INTRAVENOUS | Status: AC
  Administered 2021-05-06: 420 mg via INTRAVENOUS
  Filled 2021-05-05: qty 10.5

## 2021-05-06 ENCOUNTER — Encounter (HOSPITAL_COMMUNITY): Payer: Self-pay | Admitting: Urology

## 2021-05-06 ENCOUNTER — Ambulatory Visit (HOSPITAL_COMMUNITY)
Admission: RE | Admit: 2021-05-06 | Discharge: 2021-05-06 | Disposition: A | Discharge status: Home / Self Care | Payer: BC Managed Care – PPO | Source: Ambulatory Visit | Attending: Urology | Admitting: Urology

## 2021-05-06 ENCOUNTER — Ambulatory Visit (HOSPITAL_COMMUNITY): Payer: BC Managed Care – PPO | Admitting: Physician Assistant

## 2021-05-06 ENCOUNTER — Encounter (HOSPITAL_COMMUNITY)
Admission: RE | Disposition: A | Discharge status: Home / Self Care | Payer: Self-pay | Source: Ambulatory Visit | Attending: Urology

## 2021-05-06 ENCOUNTER — Ambulatory Visit (HOSPITAL_COMMUNITY): Payer: BC Managed Care – PPO | Admitting: Certified Registered"

## 2021-05-06 ENCOUNTER — Ambulatory Visit (HOSPITAL_COMMUNITY): Payer: BC Managed Care – PPO

## 2021-05-06 DIAGNOSIS — N200 Calculus of kidney: Secondary | ICD-10-CM | POA: Insufficient documentation

## 2021-05-06 DIAGNOSIS — Z888 Allergy status to other drugs, medicaments and biological substances status: Secondary | ICD-10-CM | POA: Diagnosis not present

## 2021-05-06 DIAGNOSIS — F902 Attention-deficit hyperactivity disorder, combined type: Secondary | ICD-10-CM | POA: Diagnosis not present

## 2021-05-06 DIAGNOSIS — G43009 Migraine without aura, not intractable, without status migrainosus: Secondary | ICD-10-CM | POA: Diagnosis not present

## 2021-05-06 DIAGNOSIS — N10 Acute tubulo-interstitial nephritis: Secondary | ICD-10-CM | POA: Diagnosis not present

## 2021-05-06 DIAGNOSIS — Z87891 Personal history of nicotine dependence: Secondary | ICD-10-CM | POA: Insufficient documentation

## 2021-05-06 DIAGNOSIS — N135 Crossing vessel and stricture of ureter without hydronephrosis: Secondary | ICD-10-CM | POA: Diagnosis not present

## 2021-05-06 DIAGNOSIS — Z882 Allergy status to sulfonamides status: Secondary | ICD-10-CM | POA: Insufficient documentation

## 2021-05-06 DIAGNOSIS — Z20822 Contact with and (suspected) exposure to covid-19: Secondary | ICD-10-CM | POA: Insufficient documentation

## 2021-05-06 DIAGNOSIS — Z841 Family history of disorders of kidney and ureter: Secondary | ICD-10-CM | POA: Diagnosis not present

## 2021-05-06 DIAGNOSIS — N131 Hydronephrosis with ureteral stricture, not elsewhere classified: Secondary | ICD-10-CM | POA: Insufficient documentation

## 2021-05-06 DIAGNOSIS — Z79899 Other long term (current) drug therapy: Secondary | ICD-10-CM | POA: Insufficient documentation

## 2021-05-06 DIAGNOSIS — N132 Hydronephrosis with renal and ureteral calculous obstruction: Secondary | ICD-10-CM | POA: Diagnosis not present

## 2021-05-06 HISTORY — PX: CYSTOSCOPY WITH RETROGRADE PYELOGRAM, URETEROSCOPY AND STENT PLACEMENT: SHX5789

## 2021-05-06 HISTORY — PX: HOLMIUM LASER APPLICATION: SHX5852

## 2021-05-06 SURGERY — CYSTOURETEROSCOPY, WITH RETROGRADE PYELOGRAM AND STENT INSERTION
Anesthesia: General | Laterality: Bilateral

## 2021-05-06 MED ORDER — FENTANYL CITRATE (PF) 100 MCG/2ML IJ SOLN: 25.0000 ug | INTRAMUSCULAR | Status: DC | PRN

## 2021-05-06 MED ORDER — MIDAZOLAM HCL 2 MG/2ML IJ SOLN
INTRAMUSCULAR | Status: AC
  Filled 2021-05-06: qty 2

## 2021-05-06 MED ORDER — DEXAMETHASONE SODIUM PHOSPHATE 10 MG/ML IJ SOLN
INTRAMUSCULAR | Status: AC
  Filled 2021-05-06: qty 1

## 2021-05-06 MED ORDER — CHLORHEXIDINE GLUCONATE 0.12 % MT SOLN
15.0000 mL | Freq: Once | OROMUCOSAL | Status: AC
  Administered 2021-05-06: 15 mL via OROMUCOSAL

## 2021-05-06 MED ORDER — CEPHALEXIN 500 MG PO CAPS: 500.0000 mg | ORAL_CAPSULE | Freq: Two times a day (BID) | ORAL | 0 refills | Status: AC

## 2021-05-06 MED ORDER — ORAL CARE MOUTH RINSE: 15.0000 mL | Freq: Once | OROMUCOSAL | Status: AC

## 2021-05-06 MED ORDER — PROPOFOL 10 MG/ML IV BOLUS
INTRAVENOUS | Status: DC | PRN
  Administered 2021-05-06: 200 mg via INTRAVENOUS

## 2021-05-06 MED ORDER — ONDANSETRON HCL 4 MG/2ML IJ SOLN
INTRAMUSCULAR | Status: DC | PRN
  Administered 2021-05-06: 4 mg via INTRAVENOUS

## 2021-05-06 MED ORDER — FENTANYL CITRATE (PF) 100 MCG/2ML IJ SOLN
INTRAMUSCULAR | Status: AC
  Administered 2021-05-06: 50 ug
  Filled 2021-05-06: qty 2

## 2021-05-06 MED ORDER — ONDANSETRON HCL 4 MG/2ML IJ SOLN
4.0000 mg | Freq: Four times a day (QID) | INTRAMUSCULAR | Status: DC | PRN
Start: 2021-05-06 — End: 2021-05-06

## 2021-05-06 MED ORDER — FENTANYL CITRATE (PF) 100 MCG/2ML IJ SOLN
INTRAMUSCULAR | Status: AC
  Administered 2021-05-06: 50 ug via INTRAVENOUS
  Filled 2021-05-06: qty 2

## 2021-05-06 MED ORDER — LACTATED RINGERS IV SOLN: INTRAVENOUS | Status: DC

## 2021-05-06 MED ORDER — LIDOCAINE 2% (20 MG/ML) 5 ML SYRINGE
INTRAMUSCULAR | Status: DC | PRN
  Administered 2021-05-06: 50 mg via INTRAVENOUS

## 2021-05-06 MED ORDER — LIDOCAINE 2% (20 MG/ML) 5 ML SYRINGE
INTRAMUSCULAR | Status: AC
  Filled 2021-05-06: qty 5

## 2021-05-06 MED ORDER — DIATRIZOATE MEGLUMINE 30 % UR SOLN
URETHRAL | Status: AC
  Filled 2021-05-06: qty 100

## 2021-05-06 MED ORDER — DEXAMETHASONE SODIUM PHOSPHATE 10 MG/ML IJ SOLN
INTRAMUSCULAR | Status: DC | PRN
  Administered 2021-05-06: 4 mg via INTRAVENOUS

## 2021-05-06 MED ORDER — FENTANYL CITRATE (PF) 100 MCG/2ML IJ SOLN: 50.0000 ug | INTRAMUSCULAR | Status: DC

## 2021-05-06 MED ORDER — PROPOFOL 10 MG/ML IV BOLUS
INTRAVENOUS | Status: AC
  Filled 2021-05-06: qty 20

## 2021-05-06 MED ORDER — OXYCODONE-ACETAMINOPHEN 5-325 MG PO TABS: 1.0000 | ORAL_TABLET | Freq: Four times a day (QID) | ORAL | 0 refills | Status: DC | PRN

## 2021-05-06 MED ORDER — MIDAZOLAM HCL 2 MG/2ML IJ SOLN
INTRAMUSCULAR | Status: DC | PRN
  Administered 2021-05-06: 2 mg via INTRAVENOUS

## 2021-05-06 MED ORDER — DIATRIZOATE MEGLUMINE 30 % UR SOLN
URETHRAL | Status: DC | PRN
  Administered 2021-05-06: 80 mL via URETHRAL

## 2021-05-06 MED ORDER — KETOROLAC TROMETHAMINE 10 MG PO TABS: 10.0000 mg | ORAL_TABLET | Freq: Three times a day (TID) | ORAL | 0 refills | Status: DC | PRN

## 2021-05-06 MED ORDER — SODIUM CHLORIDE 0.9 % IR SOLN
Status: DC | PRN
  Administered 2021-05-06 (×2): 3000 mL via INTRAVESICAL

## 2021-05-06 MED ORDER — FENTANYL CITRATE (PF) 100 MCG/2ML IJ SOLN
INTRAMUSCULAR | Status: DC | PRN
  Administered 2021-05-06: 50 ug via INTRAVENOUS
  Administered 2021-05-06 (×2): 25 ug via INTRAVENOUS
  Administered 2021-05-06 (×2): 50 ug via INTRAVENOUS

## 2021-05-06 MED ORDER — ONDANSETRON HCL 4 MG/2ML IJ SOLN
INTRAMUSCULAR | Status: AC
  Filled 2021-05-06: qty 2

## 2021-05-06 MED ORDER — FENTANYL CITRATE (PF) 100 MCG/2ML IJ SOLN
INTRAMUSCULAR | Status: AC
  Filled 2021-05-06: qty 2

## 2021-05-06 MED ORDER — OXYCODONE HCL 5 MG PO TABS: 5.0000 mg | ORAL_TABLET | Freq: Once | ORAL | Status: DC | PRN

## 2021-05-06 MED ORDER — OXYCODONE HCL 5 MG/5ML PO SOLN
5.0000 mg | Freq: Once | ORAL | Status: DC | PRN
Start: 2021-05-06 — End: 2021-05-06

## 2021-05-06 SURGICAL SUPPLY — 24 items
BAG URO CATCHER STRL LF (MISCELLANEOUS) ×2 IMPLANT
BASKET LASER NITINOL 1.9FR (BASKET) ×2 IMPLANT
CATH INTERMIT  6FR 70CM (CATHETERS) ×2 IMPLANT
CLOTH BEACON ORANGE TIMEOUT ST (SAFETY) ×2 IMPLANT
EXTRACTOR STONE 1.7FRX115CM (UROLOGICAL SUPPLIES) IMPLANT
GLOVE SURG ENC TEXT LTX SZ7.5 (GLOVE) ×2 IMPLANT
GOWN STRL REUS W/TWL LRG LVL3 (GOWN DISPOSABLE) ×2 IMPLANT
GUIDEWIRE ANG ZIPWIRE 038X150 (WIRE) ×2 IMPLANT
GUIDEWIRE STR DUAL SENSOR (WIRE) ×4 IMPLANT
GUIDEWIRE ZIPWRE .038 STRAIGHT (WIRE) ×2 IMPLANT
KIT TURNOVER KIT A (KITS) ×2 IMPLANT
LASER FIB FLEXIVA PULSE ID 365 (Laser) IMPLANT
LASER FIB FLEXIVA PULSE ID 550 (Laser) IMPLANT
LASER FIB FLEXIVA PULSE ID 910 (Laser) IMPLANT
MANIFOLD NEPTUNE II (INSTRUMENTS) ×2 IMPLANT
PACK CYSTO (CUSTOM PROCEDURE TRAY) ×2 IMPLANT
SHEATH URETERAL 12FRX28CM (UROLOGICAL SUPPLIES) IMPLANT
SHEATH URETERAL 12FRX35CM (MISCELLANEOUS) ×2 IMPLANT
STENT POLARIS 5FRX26 (STENTS) ×4 IMPLANT
TRACTIP FLEXIVA PULS ID 200XHI (Laser) IMPLANT
TRACTIP FLEXIVA PULSE ID 200 (Laser) ×2 IMPLANT
TUBE FEEDING 8FR 16IN STR KANG (MISCELLANEOUS) ×2 IMPLANT
TUBING CONNECTING 10 (TUBING) ×2 IMPLANT
TUBING UROLOGY SET (TUBING) ×2 IMPLANT

## 2021-05-06 NOTE — H&P (Signed)
Thomas Howell is an 30 y.o. male.    Chief Complaint: Pre-OP BILATERAL Ureteroscopic Stone Manipulation  HPI:   1 - Recurrent BILATERAL Renal / Ureteral Stones- s/p bilateral stent placement 04/19/21 for L>R rapidly recurrent renal / ureteral stones in setting of refracotry colic.  Today "Thomas Howell" is seen to proceed with BILATERAL ureteroscopy with goal of stone free. No interval fevers. C19 screen negative.   Past Medical History:  Diagnosis Date  . Complication of anesthesia   . Dysrhythmia    hx of SVT ablation in 2019 - DR Lewayne Bunting dismissed by MD   . Frequency-urgency syndrome   . History of kidney stones   . Hx of nausea and vomiting    d/t kidney stone  . Hx of sepsis 08/11/2017   due to kidney stone/ hydronephrosis  . Left ureteral stone   . Migraine    hx of migraines   . Pain due to ureteral stent (HCC)   . PONV (postoperative nausea and vomiting)    none recently  . Renal calculi    bilateral per ct 07-26-2016  . Scoliosis of lumbar spine 1994   treated at [facility] until age 23  . Wears glasses     Past Surgical History:  Procedure Laterality Date  . CYSTOSCOPY W/ RETROGRADES Right 07/06/2015   Procedure: CYSTOSCOPY WITH RETROGRADE PYELOGRAM;  Surgeon: Jerilee Field, MD;  Location: [facility];  Service: Urology;  Laterality: Right;  . CYSTOSCOPY W/ URETERAL STENT PLACEMENT Left 08/08/2015   Procedure: CYSTOSCOPY WITH STENT REPLACEMENT;  Surgeon: Jerilee Field, MD;  [facility];  Service: Urology;  Laterality: Left;  . CYSTOSCOPY W/ URETERAL STENT PLACEMENT Left 02/21/2017   Procedure: CYSTOSCOPY WITH RETROGRADE PYELOGRAM/URETERAL LEFT STENT PLACEMENT WITH  LASER;  Surgeon: Bjorn Pippin, MD;  Location: [facility];  Service: Urology;  Laterality: Left;  . CYSTOSCOPY W/ URETERAL STENT PLACEMENT Left 08/10/2017   Procedure: CYSTOSCOPY WITH RETROGRADE PYELOGRAM/URETERAL STENT PLACEMENT;  Surgeon: Heloise Purpura, MD;  Location: [facility];  Service:  Urology;  Laterality: Left;  . CYSTOSCOPY W/ URETERAL STENT PLACEMENT Bilateral 04/19/2021   Procedure: CYSTOSCOPY WITH RETROGRADE PYELOGRAM/URETERAL STENT PLACEMENT;  Surgeon: Sebastian Ache, MD;  Location: [facility];  Service: Urology;  Laterality: Bilateral;  . CYSTOSCOPY WITH RETROGRADE PYELOGRAM, URETEROSCOPY AND STENT PLACEMENT Left 06/24/2014   Procedure: CYSTOSCOPY WITH RETROGRADE PYELOGRAM,  AND STENT PLACEMENT;  Surgeon: Magdalene Molly, MD;  Location: [facility];  Service: Urology;  Laterality: Left;  . CYSTOSCOPY WITH RETROGRADE PYELOGRAM, URETEROSCOPY AND STENT PLACEMENT Left 07/05/2014   Procedure: CYSTO/LEFT URETEROSCOPY/LEFT RETROGRADE PYELOGRAM/LEFT STENT PLACEMENT;  Surgeon: Jerilee Field, MD;  [facility];  Service: Urology;  Laterality: Left;  . CYSTOSCOPY WITH RETROGRADE PYELOGRAM, URETEROSCOPY AND STENT PLACEMENT Left 07/06/2015   Procedure: CYSTOSCOPY WITH RETROGRADE PYELOGRAM, URETEROSCOPY , LASER, STENT PLACEMENT and BASKET EXTRACTION;  Surgeon: Jerilee Field, MD;  [facility];  Service: Urology;  Laterality: Left;  . CYSTOSCOPY WITH RETROGRADE PYELOGRAM, URETEROSCOPY AND STENT PLACEMENT Left 08/19/2015   Procedure: CYSTOSCOPY WITH RETROGRADE PYELOGRAM, URETEROSCOPY AND STENT PLACEMENT;  Surgeon: Jerilee Field, MD;  [facility];  Service: Urology;  Laterality: Left;  . CYSTOSCOPY WITH RETROGRADE PYELOGRAM, URETEROSCOPY AND STENT PLACEMENT Left 03/13/2018   Procedure: CYSTOSCOPY WITH RETROGRADE PYELOGRAM, URETEROSCOPY AND LEFT STENT PLACEMENT;  Surgeon: Bjorn Pippin, MD;  [facility];  Service: Urology;  Laterality: Left;  . CYSTOSCOPY WITH RETROGRADE PYELOGRAM, URETEROSCOPY AND STENT PLACEMENT Left 03/11/2021   Procedure: CYSTOSCOPY WITH RETROGRADE PYELOGRAM,  URETEROSCOPY AND STENT PLACEMENT,STONE EXTRACTION,HOLMIUM LASER;  Surgeon: Marcine Matarahlstedt, Stephen, MD;  Location[facility];  Service: Urology;  Laterality: Left;  . CYSTOSCOPY WITH  URETEROSCOPY AND STENT PLACEMENT Left 01/16/2016   Procedure: CYSTOSCOPY WITH LEFT RETROGRADE PYELOGRAM  LEFT DIGITAL URETEROSCOPY AND PLACEMENT LEFT URETERAL STENT;  Surgeon: Jerilee FieldMatthew Eskridge, MD;  Location[facility];  Service: Urology;  Laterality: Left;  . CYSTOSCOPY WITH URETEROSCOPY, STONE BASKETRY AND STENT PLACEMENT Left 02/13/2016   Procedure: CYSTOSCOPY WITH LEFT URETEROSCOPY, HOLMIUM LASER AND STENT PLACEMENT;  Surgeon: Jerilee FieldMatthew Eskridge, MD;  Location[facility];  Service: Urology;  Laterality: Left;  . CYSTOSCOPY/RETROGRADE/URETEROSCOPY/STONE EXTRACTION WITH BASKET Left 08/08/2015   Procedure: CYSTOSCOPY/RETROGRADE/URETEROSCOPY/STONE EXTRACTION WITH BASKET;  Surgeon: Jerilee FieldMatthew Eskridge, MD;  [facility] [place];  Service: Urology;  Laterality: Left;  . CYSTOSCOPY/URETEROSCOPY/HOLMIUM LASER/STENT PLACEMENT Left 07/30/2016   Procedure: CYSTOSCOPY/URETEROSCOPY/HOLMIUM LASER/STENT PLACEMENT;  Surgeon: Jerilee FieldMatthew Eskridge, MD;  [facility] [place];  Service: Urology;  Laterality: Left;  . CYSTOSCOPY/URETEROSCOPY/HOLMIUM LASER/STENT PLACEMENT Left 08/19/2017   Procedure: CYSTOSCOPY/URETEROSCOPY/HOLMIUM LASER/STENT PLACEMENT;  Surgeon: [name], Matthew, MD;  [facility] [place];  Service: Urology;  Laterality: Left;  . CYSTOSCOPY/URETEROSCOPY/HOLMIUM LASER/STENT PLACEMENT N/A 10/13/2018   Procedure: CYSTOSCOPY/RETROGRADE RIGHT URETEROSCOPY/ AND RIGHTSTENT PLACEMENT;  Surgeon: Bjorn PippinWrenn, John, MD;  Location[facility];  Service: Urology;  Laterality: N/A;  . EXTRACORPOREAL SHOCK WAVE LITHOTRIPSY Left 07-07-2015  &  12-26-2014  . 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 HOLMIUM LASER APPLICATION Left 07/05/2014   Procedure: LASER LITHO;  Surgeon: Jerilee FieldMatthew Eskridge, MD;  [facility] [place];  Service: Urology;  Laterality: Left;  . HOLMIUM LASER APPLICATION Left 08/08/2015   Procedure:  HOLMIUM LASER  WITH LITHOTRIPSY ;  Surgeon: Jerilee FieldMatthew Eskridge, MD;  [facility] [place];  Service: Urology;  Laterality: Left;  . HOLMIUM LASER APPLICATION Left 02/21/2017   Procedure: HOLMIUM LASER APPLICATION;  Surgeon: Bjorn PippinJohn Wrenn, MD;  [facility];  Service: Urology;  Laterality: Left;  . HOLMIUM LASER APPLICATION Left 03/13/2018   Procedure: HOLMIUM LASER APPLICATION;  Surgeon: Bjorn PippinWrenn, John, MD;  Location: [facility];  Service: Urology;  Laterality: Left;  . lumpectomy in neck for cat scratch fever      surgery for club foot   . LYMPH GLAND EXCISION  2003   neck--  benign  . SVT ABLATION N/A 05/22/2018   Procedure: SVT ABLATION;  Surgeon: Marinus Mawaylor, Gregg W, MD;  [facility];  Service: Cardiovascular;  Laterality: N/A;  . SVT ABLATION     2019     Family History  Problem Relation Age of Onset  . Cancer Father   . Kidney Stones Mother   . Cancer Other   . Hypertension Other   . Hyperlipidemia Other   . Stroke Other   . Kidney Stones Brother    Social History:  reports that he quit smoking about 6 years ago. He quit after 2.00 years of use. He has never used smokeless tobacco. He reports previous alcohol use. He reports that he does not use drugs.  Allergies:  Allergies  Allergen Reactions  . Bactrim [Sulfamethoxazole-Trimethoprim] Nausea And Vomiting  . Ketamine Other (See Comments)    Did not like the sensation    Medications Prior to Admission  Medication Sig Dispense Refill  . acetaminophen (TYLENOL) 500 MG tablet Take 1,000 mg by mouth every 8 (eight) hours as needed for headache.    . hydrochlorothiazide (HYDRODIURIL) 25  MG tablet Take 25 mg by mouth daily.     Marland Kitchen ketorolac (TORADOL) 10 MG tablet Take 1 tablet (10 mg total) by mouth every 6 (six) hours as needed. (Patient taking differently: Take 10 mg by mouth every 6 (six) hours as needed for severe pain.) 20 tablet 0  . rizatriptan (MAXALT-MLT) 10 MG disintegrating tablet TAKE 1 TABLET BY  MOUTH AS NEEDED MAY REPEAT IN 2 HOURS IF NEEDED (Patient taking differently: Take 10 mg by mouth daily as needed for migraine. May repeat in 2 hrs if needed) 10 tablet 5  . tamsulosin (FLOMAX) 0.4 MG CAPS capsule Take 1 capsule (0.4 mg total) by mouth daily. 7 capsule 0  . ibuprofen (ADVIL) 600 MG tablet Take 1 tablet (600 mg total) by mouth every 6 (six) hours as needed. (Patient not taking: Reported on 04/27/2021) 30 tablet 0    Results for orders placed or performed during the hospital encounter of 05/04/21 (from the past 48 hour(s))  Basic metabolic panel per protocol     Status: Abnormal   Collection Time: 05/04/21  2:34 PM  Result Value Ref Range   Sodium 138 135 - 145 mmol/L   Potassium 3.6 3.5 - 5.1 mmol/L   Chloride 105 98 - 111 mmol/L   CO2 26 22 - 32 mmol/L   Glucose, Bld 103 (H) 70 - 99 mg/dL    Comment: Glucose reference range applies only to samples taken after fasting for at least 8 hours.   BUN 15 6 - 20 mg/dL   Creatinine, Ser 5.39 0.61 - 1.24 mg/dL   Calcium 9.4 8.9 - 76.7 mg/dL   GFR, Estimated >34 >19 mL/min    Comment: (NOTE) Calculated using the CKD-EPI Creatinine Equation (2021)    Anion gap 7 5 - 15    Comment: Performed at [facility], [address], [place], [place] [address]  CBC per protocol     Status: None   Collection Time: 05/04/21  2:34 PM  Result Value Ref Range   WBC 6.2 4.0 - 10.5 K/uL   RBC 4.70 4.22 - 5.81 MIL/uL   Hemoglobin 14.2 13.0 - 17.0 g/dL   HCT 40.9 73.5 - 32.9 %   MCV 88.9 80.0 - 100.0 fL   MCH 30.2 26.0 - 34.0 pg   MCHC 34.0 30.0 - 36.0 g/dL   RDW 92.4 26.8 - 34.1 %   Platelets 216 150 - 400 K/uL   nRBC 0.0 0.0 - 0.2 %    Comment: Performed at [facility], [address] [place], [place] [address]   No results found.  Review of Systems  Constitutional: Negative for chills and fever.  Genitourinary: Positive for urgency.  All other systems reviewed and are negative.   Height 5\' 8"   (1.727 m), weight 108.8 kg. Physical Exam Vitals reviewed.  HENT:     Head: Normocephalic.  Eyes:     Pupils: Pupils are equal, round, and reactive to light.  Cardiovascular:     Rate and Rhythm: Normal rate.     Pulses: Normal pulses.  Pulmonary:     Effort: Pulmonary effort is normal.  Abdominal:     General: Abdomen is flat.  Genitourinary:    Comments: No CVAT at present.  Musculoskeletal:        General: Normal range of motion.     Cervical back: Normal range of motion.  Skin:    General: Skin is warm.  Neurological:     Mental  Status: He is alert.  Psychiatric:        Mood and Affect: Mood normal.      Assessment/Plan  Proceed as planned with BILATERAL ureteroscopy / stent exchange with goal of stone free. Risks, benefits, alternatives, expected per-op course discussed previously and reiterated today.   Sebastian Ache, MD 05/06/2021, 6:31 AM

## 2021-05-06 NOTE — Op Note (Signed)
NAME: KAMIN, NIBLACK MEDICAL RECORD NO: [id number] ACCOUNT NO: [id number] DATE OF BIRTH: Mar 11, 1991 FACILITY: [facility] LOCATION: [facility] PH[facility]ICIAN: Sebastian Ache, MD  Operative Report   DATE OF PROCEDURE: 05/06/2021  PREOPERATIVE DIAGNOSIS:  Bilateral recurrent renal stones.  PROCEDURE PERFORMED:   1.  Cystoscopy with bilateral retrograde pyelograms. 2.  Bilateral ureteroscopy with laser lithotripsy. 3.  Exchange of bilateral ureteral stents, 5 x 24 Polaris, no tether.  ESTIMATED BLOOD LOSS:  Nil.  COMPLICATIONS:  None.  SPECIMEN:  Bilateral renal stone fragments for composition analysis.  FINDINGS:   1.  Left greater than right intrarenal stones. 2.  Partial low-grade left mid ureteral stricture, no obvious stone impaction at this site. 3.  Successful replacement of bilateral ureteral stents, proximal end in the renal pelvis, distal end in the urinary bladder.  INDICATIONS FOR PROCEDURE:  The patient is a very pleasant 30 year old man who unfortunately has a rapidly recurrent high risk urolithiasis.  He was found on workup of colicky flank pain to have recurrent left greater than right renal stone with also a  very small left ureteral stone.  He underwent a temporizing measure with bilateral stents last month.  It was planned for bilateral ureteroscopy with a goal of stone free.  He presents for this today.  Informed consent was signed and placed in the  medical record.  DESCRIPTION OF PROCEDURE:  The patient being verified, procedure being bilateral ureteroscopy with stent placement was confirmed.  Procedure timeout was performed and intravenous antibiotics administered.  General LMA anesthesia induced.  The patient was  placed into the low lithotomy position.  Sterile field was created, prepped and draped the patient's penis, perineum, proximal thigh using iodine.  Cystourethroscopy was performed using 21-French rigid cystoscope was offset lens.  Inspection of anterior  and  posterior urethra were unremarkable.  Inspection of bladder revealed distal end of bilateral ureteral stents in situ.  The distal end of the left stent was grasped brought to the level of the urethral meatus with a 0.038 ZIPwire was advanced to the  level of the lower pole, exchanged for open-ended catheter and left retrograde pyelogram was obtained.  Left retrograde pyelogram demonstrated a single left ureter with a single system left kidney.  There was a significant hydronephrosis without much ureteronephrosis consistent with a chronic dilation.  The ZIPwire was once again advanced set aside as a  safety wire.  Next, the distal end of the right stent was grasped at the level of the urethral meatus and a 0.038 ZIPwire was advanced to the level of lower pole exchanged for open-ended catheter and right retrograde pyelogram was obtained.  Right retrograde pyelogram demonstrated a single right ureter and single system right kidney. No filling defects or narrowing noted.  A ZIPwire was once again advanced and set aside as a safety wire.  An 8-French feeding tube placed in the urinary  bladder, pressure released and semi-rigid ureteroscopy was performed of the distal four-fifths to the right ureter alongside a separate Sensor working wire. No mucosal abnormalities were found.  Next semi-rigid ureteroscopy was performed of the distal four-fifths to the left ureter alongside a separate Sensor working wire. No mucosal abnormalities were found.  The semirigid scope was then exchanged for a 12/14 medium length ureteral access  sheath to the level of the mid ureter.  Using continuous fluoroscopic guidance and flexible digital ureteroscopy was performed to the proximal left ureter and systematic inspection of the left kidney, including all calices x3.  There was an  area of  relative narrowing in the mid to upper third of the ureter consistent with a partial stricture.  This did accommodate 2 wires in the flexible  ureteroscope; however, there were no obvious signs of stone impaction at the site at present.  Inspection of the  kidney revealed a significantly dilated kidney.  As expected, there was multifocal renal stones, mostly in the lower pole.  These were grasped with the escape basket and repositioned into the upper pole to allow for less acute angulation and holmium  laser energy applied using settings of 0.2 joules and 20 Hz and approximately 50% of the stone was dusted 50% fragmented with the remaining fragments being amenable to simple basketing.  They were sequentially grasped with an escape basket and removed  and set side.  Following this, complete resolution of all accessible stone fragments larger than 130 mm.  Excellent hemostasis.  No evidence of any renal perforation.  Access sheath was removed under continuous vision.  No significant mucosal  abnormalities were found.  Next, the access sheath was placed over the right Sensor working wire to the level of the proximal right ureter.  Using continuous fluoroscopic guidance, a flexible digital ureteroscopy was performed in the proximal right  ureter and systematic inspection of the right kidney, including all calices x3.  There was some small volume of free floating stone in the upper pole, but mostly upper pole papillary tip calcifications, some just barely submucosal.  Holmium laser energy  was used to unroof the stone material in the upper pole.  Dominant stone was fragmented into smaller pieces and all fragments were amenable to simple basketing.  This was sequentially grasped, removed and set aside as per the left side.  Following this,  complete resolution of all stone fragments larger than 130 mm.  Excellent hemostasis, access sheath was removed under continuous vision.  No significant mucosal abnormalities were found.  Given his partial left ureteral narrowing and significant volume  of stone, it was felt that a stent exchange with nontethered  seems to be most prudent as such, a new 5 x 24 Polaris-type stents were placed over the remaining safety wire bilaterally using fluoroscopic guidance and good proximal and distal plane were  noted.  The procedure was then terminated.  Patient tolerated procedure well.  No immediate complications.  The patient was taken to the postanesthesia care in stable condition.  Plan for discharge home.   PUS D: 05/06/2021 9:51:47 am T: 05/06/2021 6:16:00 pm  JOB: [id number]/ [id number]

## 2021-05-06 NOTE — Discharge Instructions (Signed)
1 - You may have urinary urgency (bladder spasms) and bloody urine on / off with stent in place. This is normal. ° °2 - Call MD or go to ER for fever >102, severe pain / nausea / vomiting not relieved by medications, or acute change in medical status ° °

## 2021-05-06 NOTE — Transfer of Care (Signed)
Immediate Anesthesia Transfer of Care Note  Patient: Thomas Howell  Procedure(s) Performed: CYSTOSCOPY WITH RETROGRADE PYELOGRAM, URETEROSCOPY AND STENT REPLACEMENT (Bilateral ) HOLMIUM LASER APPLICATION (Bilateral )  Patient Location: PACU  Anesthesia Type:General  Level of Consciousness: awake  Airway & Oxygen Therapy: Patient Spontanous Breathing and Patient connected to face mask oxygen  Post-op Assessment: Report given to RN, Post -op Vital signs reviewed and stable and Patient moving all extremities X 4  Post vital signs: Reviewed and stable  Last Vitals:  Vitals Value Taken Time  BP    Temp    Pulse 72 05/06/21 0956  Resp    SpO2 100 % 05/06/21 0956  Vitals shown include unvalidated device data.  Last Pain:  Vitals:   05/06/21 0754  TempSrc:   PainSc: 8       Patients Stated Pain Goal: 7 (05/06/21 0754)  Complications: No complications documented.

## 2021-05-06 NOTE — Anesthesia Procedure Notes (Signed)
Procedure Name: LMA Insertion Date/Time: 05/06/2021 8:26 AM Performed by: Nelle Don, CRNA Pre-anesthesia Checklist: Patient identified, Emergency Drugs available, Suction available and Patient being monitored Patient Re-evaluated:Patient Re-evaluated prior to induction Oxygen Delivery Method: Circle system utilized Preoxygenation: Pre-oxygenation with 100% oxygen Induction Type: IV induction Ventilation: Mask ventilation without difficulty LMA: LMA inserted LMA Size: 4.0 Number of attempts: 1 Dental Injury: Teeth and Oropharynx as per pre-operative assessment

## 2021-05-06 NOTE — Brief Op Note (Signed)
05/06/2021  9:45 AM  PATIENT:  Thomas Howell  30 y.o. male  PRE-OPERATIVE DIAGNOSIS:  BILATERAL RENAL STONES  POST-OPERATIVE DIAGNOSIS:  BILATERAL RENAL STONES  PROCEDURE:  Procedure(s) with comments: CYSTOSCOPY WITH RETROGRADE PYELOGRAM, URETEROSCOPY AND STENT REPLACEMENT (Bilateral) - 90 MINS HOLMIUM LASER APPLICATION (Bilateral)  SURGEON:  Surgeon(s) and Role:    Sebastian Ache, MD - Primary  PHYSICIAN ASSISTANT:   ASSISTANTS: none   ANESTHESIA:   general  EBL:  minimal   BLOOD ADMINISTERED:none  DRAINS: none   LOCAL MEDICATIONS USED:  NONE  SPECIMEN:  Source of Specimen:  bilateral renal stones  DISPOSITION OF SPECIMEN:  Given to pt's family  COUNTS:  YES  TOURNIQUET:  * No tourniquets in log *  DICTATION: .Other Dictation: Dictation Number  [id number]  PLAN OF CARE: Discharge to home after PACU  PATIENT DISPOSITION:  PACU - hemodynamically stable.   Delay start of Pharmacological VTE agent (>24hrs) due to surgical blood loss or risk of bleeding: yes

## 2021-05-06 NOTE — Anesthesia Postprocedure Evaluation (Signed)
Anesthesia Post Note  Patient: Thomas Howell  Procedure(s) Performed: CYSTOSCOPY WITH RETROGRADE PYELOGRAM, URETEROSCOPY AND STENT REPLACEMENT (Bilateral ) HOLMIUM LASER APPLICATION (Bilateral )     Patient location during evaluation: PACU Anesthesia Type: General Level of consciousness: awake and alert Pain management: pain level controlled Vital Signs Assessment: post-procedure vital signs reviewed and stable Respiratory status: spontaneous breathing, nonlabored ventilation, respiratory function stable and patient connected to nasal cannula oxygen Cardiovascular status: blood pressure returned to baseline and stable Postop Assessment: no apparent nausea or vomiting Anesthetic complications: no   No complications documented.  Last Vitals:  Vitals:   05/06/21 1015 05/06/21 1036  BP: 133/88 (!) 137/100  Pulse: 78 82  Resp: 11 16  Temp:  36.6 C  SpO2: 99% 97%    Last Pain:  Vitals:   05/06/21 1036  TempSrc:   PainSc: 5                  Keanan Melander S

## 2021-05-06 NOTE — Anesthesia Preprocedure Evaluation (Signed)
Anesthesia Evaluation  Patient identified by MRN, date of birth, ID band Patient awake    Reviewed: Allergy & Precautions, H&P , NPO status , Patient's Chart, lab work & pertinent test results  History of Anesthesia Complications (+) PONV and history of anesthetic complications  Airway Mallampati: II   Neck ROM: full    Dental   Pulmonary former smoker,    breath sounds clear to auscultation       Cardiovascular + dysrhythmias Supra Ventricular Tachycardia  Rhythm:regular Rate:Normal  S/p EP ablation for SVT   Neuro/Psych  Headaches,    GI/Hepatic   Endo/Other    Renal/GU Renal diseasestones     Musculoskeletal   Abdominal   Peds  Hematology   Anesthesia Other Findings   Reproductive/Obstetrics                             Anesthesia Physical Anesthesia Plan  ASA: II  Anesthesia Plan: General   Post-op Pain Management:    Induction: Intravenous  PONV Risk Score and Plan: 3 and Ondansetron, Dexamethasone, Midazolam and Treatment may vary due to age or medical condition  Airway Management Planned: LMA  Additional Equipment:   Intra-op Plan:   Post-operative Plan: Extubation in OR  Informed Consent: I have reviewed the patients History and Physical, chart, labs and discussed the procedure including the risks, benefits and alternatives for the proposed anesthesia with the patient or authorized representative who has indicated his/her understanding and acceptance.     Dental advisory given  Plan Discussed with: CRNA, Anesthesiologist and Surgeon  Anesthesia Plan Comments:         Anesthesia Quick Evaluation

## 2021-05-07 ENCOUNTER — Encounter (HOSPITAL_COMMUNITY): Payer: Self-pay | Admitting: Urology

## 2021-05-26 DIAGNOSIS — N1330 Unspecified hydronephrosis: Secondary | ICD-10-CM | POA: Diagnosis not present

## 2021-05-26 DIAGNOSIS — N200 Calculus of kidney: Secondary | ICD-10-CM | POA: Diagnosis not present

## 2021-06-26 ENCOUNTER — Ambulatory Visit (INDEPENDENT_AMBULATORY_CARE_PROVIDER_SITE_OTHER): Payer: BC Managed Care – PPO | Admitting: Nurse Practitioner

## 2021-06-26 ENCOUNTER — Other Ambulatory Visit: Payer: Self-pay

## 2021-06-26 VITALS — BP 120/87 | HR 96 | Temp 97.7°F | Ht 68.0 in | Wt 227.0 lb

## 2021-06-26 DIAGNOSIS — F419 Anxiety disorder, unspecified: Secondary | ICD-10-CM | POA: Diagnosis not present

## 2021-06-26 DIAGNOSIS — G43009 Migraine without aura, not intractable, without status migrainosus: Secondary | ICD-10-CM

## 2021-06-26 MED ORDER — SERTRALINE HCL 50 MG PO TABS: ORAL_TABLET | ORAL | 0 refills | Status: DC

## 2021-06-26 MED ORDER — HYDROXYZINE HCL 10 MG PO TABS: ORAL_TABLET | ORAL | 0 refills | Status: DC

## 2021-06-26 MED ORDER — RIZATRIPTAN BENZOATE 10 MG PO TBDP: 10.0000 mg | ORAL_TABLET | Freq: Every day | ORAL | 2 refills | Status: DC | PRN

## 2021-06-26 NOTE — Progress Notes (Signed)
Subjective:    Patient ID: MELROY BOUGHER, male    DOB: [birth year], 30 y.o.   MRN: [id number]  Anxiety Presents for initial visit. Episode onset: 6 months ago. Symptoms include depressed mood, excessive worry and nervous/anxious behavior. Patient reports no suicidal ideas. The quality of sleep is good. Nighttime awakenings: none.   Treatments tried: none.  Changed jobs several months ago which has helped his stress level some.  Was working as a Engineer, civil (consulting) in the local ED.  Averages 6 to 7 hours of sleep per night.  Complaints of rare panic attacks which is something new for him.  Denies suicidal or homicidal thoughts or ideation.  History includes ADHD but he is no longer under treatment for this.  Has a history of chronic migraines, takes Maxalt which works well for this.  Has been evaluated by neurology and headache specialist. Depression screen [facility] 2/9 06/26/2021 03/04/2020  Decreased Interest 2 3  Down, Depressed, Hopeless 1 0  PHQ - 2 Score 3 3  Altered sleeping 1 1  Tired, decreased energy 2 0  Change in appetite 3 1  Feeling bad or failure about yourself  1 0  Trouble concentrating 0 3  Moving slowly or fidgety/restless 0 3  Suicidal thoughts 0 0  PHQ-9 Score 10 11  Difficult doing work/chores Somewhat difficult Somewhat difficult  Some recent data might be hidden   GAD 7 : Generalized Anxiety Score 06/26/2021  Nervous, Anxious, on Edge 2  Control/stop worrying 2  Worry too much - different things 3  Trouble relaxing 2  Restless 1  Easily annoyed or irritable 2  Afraid - awful might happen 1  Total GAD 7 Score 13  Anxiety Difficulty Somewhat difficult       Review of Systems  Psychiatric/Behavioral:  Negative for suicidal ideas. The patient is nervous/anxious.       Objective:   Physical Exam NAD.  Alert, oriented.  Mildly anxious affect.  Making good eye contact.  Dressed appropriately.  Thoughts logical coherent and relevant.  Lungs clear.  Heart regular rate  rhythm. Today's Vitals   06/26/21 1405  BP: 120/87  Pulse: 96  Temp: 97.7 F (36.5 C)  SpO2: 97%  Weight: 227 lb (103 kg)  Height: 5\' 8"  (1.727 m)   Body mass index is 34.52 kg/m.      Assessment & Plan:   Problem List Items Addressed This Visit       Cardiovascular and Mediastinum   Migraine headache without aura   Relevant Medications   rizatriptan (MAXALT-MLT) 10 MG disintegrating tablet   sertraline (ZOLOFT) 50 MG tablet     Other   Anxiety - Primary   Relevant Medications   sertraline (ZOLOFT) 50 MG tablet   hydrOXYzine (ATARAX/VISTARIL) 10 MG tablet   Meds ordered this encounter  Medications   rizatriptan (MAXALT-MLT) 10 MG disintegrating tablet    Sig: Take 1 tablet (10 mg total) by mouth daily as needed for migraine. May repeat in 2 hrs if needed; max 2 per 24 hrs    Dispense:  10 tablet    Refill:  2    Order Specific Question:   Supervising Provider    Answer:   A [[id number]]   sertraline (ZOLOFT) 50 MG tablet    Sig: Take 1/2 tab po qd x 6 days then one po qd    Dispense:  30 tablet    Refill:  0    Order Specific Question:   Supervising  Provider    Answer:   Lilyan Punt A [[id number]]   hydrOXYzine (ATARAX/VISTARIL) 10 MG tablet    Sig: Take 1/2-1 tab po qd prn panic attacks and 1 tab po qhs prn sleep    Dispense:  45 tablet    Refill:  0    Order Specific Question:   Supervising Provider    Answer:   Lilyan Punt A [[id number]]   Continue Maxalt as directed.  Defers daily medicine at this time.  Encourage patient to keep a headache diary and to avoid overuse of Maxalt or OTC analgesics. Start Zoloft as directed.  Discontinue medication and call if any severe adverse effects. Hydroxyzine as directed for panic attacks and or sleep.  Drowsiness precautions. Return in about 1 month (around 07/27/2021) for Virtual or in person. Call back sooner if needed.

## 2021-06-27 ENCOUNTER — Encounter: Payer: Self-pay | Admitting: Nurse Practitioner

## 2021-06-27 DIAGNOSIS — F419 Anxiety disorder, unspecified: Secondary | ICD-10-CM | POA: Insufficient documentation

## 2021-07-11 ENCOUNTER — Emergency Department (HOSPITAL_COMMUNITY): Payer: BC Managed Care – PPO | Admitting: Certified Registered"

## 2021-07-11 ENCOUNTER — Encounter (HOSPITAL_COMMUNITY)
Admission: EM | Disposition: A | Discharge status: Home / Self Care | Payer: Self-pay | Source: Home / Self Care | Attending: Emergency Medicine

## 2021-07-11 ENCOUNTER — Ambulatory Visit (HOSPITAL_COMMUNITY)
Admission: EM | Admit: 2021-07-11 | Discharge: 2021-07-11 | Disposition: A | Discharge status: Home / Self Care | Payer: BC Managed Care – PPO | Attending: Urology | Admitting: Urology

## 2021-07-11 ENCOUNTER — Emergency Department (HOSPITAL_COMMUNITY): Payer: BC Managed Care – PPO

## 2021-07-11 ENCOUNTER — Other Ambulatory Visit: Payer: Self-pay

## 2021-07-11 ENCOUNTER — Encounter (HOSPITAL_COMMUNITY): Payer: Self-pay | Admitting: Emergency Medicine

## 2021-07-11 DIAGNOSIS — G43701 Chronic migraine without aura, not intractable, with status migrainosus: Secondary | ICD-10-CM | POA: Diagnosis not present

## 2021-07-11 DIAGNOSIS — I471 Supraventricular tachycardia: Secondary | ICD-10-CM | POA: Diagnosis not present

## 2021-07-11 DIAGNOSIS — N200 Calculus of kidney: Secondary | ICD-10-CM | POA: Diagnosis not present

## 2021-07-11 DIAGNOSIS — Z881 Allergy status to other antibiotic agents status: Secondary | ICD-10-CM | POA: Insufficient documentation

## 2021-07-11 DIAGNOSIS — Z87442 Personal history of urinary calculi: Secondary | ICD-10-CM | POA: Diagnosis not present

## 2021-07-11 DIAGNOSIS — Z8349 Family history of other endocrine, nutritional and metabolic diseases: Secondary | ICD-10-CM | POA: Insufficient documentation

## 2021-07-11 DIAGNOSIS — N132 Hydronephrosis with renal and ureteral calculous obstruction: Secondary | ICD-10-CM | POA: Insufficient documentation

## 2021-07-11 DIAGNOSIS — Z8249 Family history of ischemic heart disease and other diseases of the circulatory system: Secondary | ICD-10-CM | POA: Diagnosis not present

## 2021-07-11 DIAGNOSIS — Z87891 Personal history of nicotine dependence: Secondary | ICD-10-CM | POA: Insufficient documentation

## 2021-07-11 DIAGNOSIS — Z20822 Contact with and (suspected) exposure to covid-19: Secondary | ICD-10-CM | POA: Diagnosis not present

## 2021-07-11 DIAGNOSIS — K760 Fatty (change of) liver, not elsewhere classified: Secondary | ICD-10-CM | POA: Diagnosis not present

## 2021-07-11 DIAGNOSIS — F902 Attention-deficit hyperactivity disorder, combined type: Secondary | ICD-10-CM | POA: Diagnosis not present

## 2021-07-11 HISTORY — PX: CYSTOSCOPY W/ URETERAL STENT PLACEMENT: SHX1429

## 2021-07-11 LAB — URINALYSIS, ROUTINE W REFLEX MICROSCOPIC
Bacteria, UA: NONE SEEN
Bilirubin Urine: NEGATIVE
Glucose, UA: NEGATIVE mg/dL
Ketones, ur: 80 mg/dL — AB
Leukocytes,Ua: NEGATIVE
Nitrite: NEGATIVE
Protein, ur: 100 mg/dL — AB
Specific Gravity, Urine: 1.013 (ref 1.005–1.030)
pH: 7 (ref 5.0–8.0)

## 2021-07-11 LAB — RESP PANEL BY RT-PCR (FLU A&B, COVID) ARPGX2
Influenza A by PCR: NEGATIVE
Influenza B by PCR: NEGATIVE
SARS Coronavirus 2 by RT PCR: NEGATIVE

## 2021-07-11 LAB — BASIC METABOLIC PANEL
Anion gap: 19 — ABNORMAL HIGH (ref 5–15)
BUN: 15 mg/dL (ref 6–20)
CO2: 16 mmol/L — ABNORMAL LOW (ref 22–32)
Calcium: 9.9 mg/dL (ref 8.9–10.3)
Chloride: 107 mmol/L (ref 98–111)
Creatinine, Ser: 1.06 mg/dL (ref 0.61–1.24)
GFR, Estimated: >60 mL/min (ref >60)
Glucose, Bld: 116 mg/dL — ABNORMAL HIGH (ref 70–99)
Potassium: 3.2 mmol/L — ABNORMAL LOW (ref 3.5–5.1)
Sodium: 142 mmol/L (ref 135–145)

## 2021-07-11 LAB — CBC WITH DIFFERENTIAL/PLATELET
Abs Immature Granulocytes: 0.02 10*3/uL (ref 0.00–0.07)
Basophils Absolute: 0 10*3/uL (ref 0.0–0.1)
Basophils Relative: 1 %
Eosinophils Absolute: 0.1 10*3/uL (ref 0.0–0.5)
Eosinophils Relative: 1 %
HCT: 42.1 % (ref 39.0–52.0)
Hemoglobin: 14.7 g/dL (ref 13.0–17.0)
Immature Granulocytes: 0 %
Lymphocytes Relative: 32 %
Lymphs Abs: 2.4 10*3/uL (ref 0.7–4.0)
MCH: 29.6 pg (ref 26.0–34.0)
MCHC: 34.9 g/dL (ref 30.0–36.0)
MCV: 84.9 fL (ref 80.0–100.0)
Monocytes Absolute: 0.6 10*3/uL (ref 0.1–1.0)
Monocytes Relative: 8 %
Neutro Abs: 4.4 10*3/uL (ref 1.7–7.7)
Neutrophils Relative %: 58 %
Platelets: 249 10*3/uL (ref 150–400)
RBC: 4.96 MIL/uL (ref 4.22–5.81)
RDW: 12.3 % (ref 11.5–15.5)
WBC: 7.5 10*3/uL (ref 4.0–10.5)
nRBC: 0 % (ref 0.0–0.2)

## 2021-07-11 SURGERY — CYSTOSCOPY, WITH RETROGRADE PYELOGRAM AND URETERAL STENT INSERTION
Anesthesia: General | Laterality: Left

## 2021-07-11 MED ORDER — PROPOFOL 10 MG/ML IV BOLUS
INTRAVENOUS | Status: AC
  Filled 2021-07-11: qty 20

## 2021-07-11 MED ORDER — IOHEXOL 300 MG/ML  SOLN
INTRAMUSCULAR | Status: DC | PRN
  Administered 2021-07-11: 10 mL via URETHRAL

## 2021-07-11 MED ORDER — DEXAMETHASONE SODIUM PHOSPHATE 10 MG/ML IJ SOLN
INTRAMUSCULAR | Status: AC
  Filled 2021-07-11: qty 1

## 2021-07-11 MED ORDER — ONDANSETRON HCL 4 MG/2ML IJ SOLN
INTRAMUSCULAR | Status: DC | PRN
  Administered 2021-07-11: 4 mg via INTRAVENOUS

## 2021-07-11 MED ORDER — DEXAMETHASONE SODIUM PHOSPHATE 10 MG/ML IJ SOLN
INTRAMUSCULAR | Status: DC | PRN
  Administered 2021-07-11: 10 mg via INTRAVENOUS

## 2021-07-11 MED ORDER — ONDANSETRON HCL 4 MG/2ML IJ SOLN
INTRAMUSCULAR | Status: AC
  Filled 2021-07-11: qty 2

## 2021-07-11 MED ORDER — FENTANYL CITRATE (PF) 250 MCG/5ML IJ SOLN
INTRAMUSCULAR | Status: DC | PRN
  Administered 2021-07-11: 50 ug via INTRAVENOUS
  Administered 2021-07-11: 100 ug via INTRAVENOUS

## 2021-07-11 MED ORDER — FENTANYL CITRATE (PF) 250 MCG/5ML IJ SOLN
INTRAMUSCULAR | Status: AC
  Filled 2021-07-11: qty 5

## 2021-07-11 MED ORDER — HYDROMORPHONE HCL 1 MG/ML IJ SOLN
1.0000 mg | Freq: Once | INTRAMUSCULAR | Status: AC
  Administered 2021-07-11: 1 mg via INTRAVENOUS
  Filled 2021-07-11: qty 1

## 2021-07-11 MED ORDER — SODIUM CHLORIDE 0.9 % IV SOLN
INTRAVENOUS | Status: AC
  Filled 2021-07-11: qty 2

## 2021-07-11 MED ORDER — PROPOFOL 10 MG/ML IV BOLUS
INTRAVENOUS | Status: DC | PRN
  Administered 2021-07-11: 200 mg via INTRAVENOUS

## 2021-07-11 MED ORDER — OXYCODONE HCL 5 MG PO TABS
ORAL_TABLET | ORAL | Status: AC
  Filled 2021-07-11: qty 1

## 2021-07-11 MED ORDER — ONDANSETRON 4 MG PO TBDP
4.0000 mg | ORAL_TABLET | Freq: Once | ORAL | Status: AC
  Administered 2021-07-11: 4 mg via ORAL
  Filled 2021-07-11: qty 1

## 2021-07-11 MED ORDER — STERILE WATER FOR IRRIGATION IR SOLN
Status: DC | PRN
  Administered 2021-07-11: 3000 mL

## 2021-07-11 MED ORDER — OXYCODONE HCL 5 MG PO TABS
5.0000 mg | ORAL_TABLET | Freq: Once | ORAL | Status: AC | PRN
Start: 2021-07-11 — End: 2021-07-11
  Administered 2021-07-11: 5 mg via ORAL

## 2021-07-11 MED ORDER — SODIUM CHLORIDE 0.9 % IV SOLN
12.5000 mg | Freq: Four times a day (QID) | INTRAVENOUS | Status: DC | PRN
  Administered 2021-07-11: 12.5 mg via INTRAVENOUS
  Filled 2021-07-11: qty 12.5

## 2021-07-11 MED ORDER — FENTANYL CITRATE (PF) 100 MCG/2ML IJ SOLN
INTRAMUSCULAR | Status: AC
  Filled 2021-07-11: qty 2

## 2021-07-11 MED ORDER — KETOROLAC TROMETHAMINE 15 MG/ML IJ SOLN
15.0000 mg | Freq: Once | INTRAMUSCULAR | Status: AC
  Administered 2021-07-11: 15 mg via INTRAVENOUS
  Filled 2021-07-11: qty 1

## 2021-07-11 MED ORDER — TAMSULOSIN HCL 0.4 MG PO CAPS
0.4000 mg | ORAL_CAPSULE | Freq: Once | ORAL | Status: AC
  Administered 2021-07-11: 0.4 mg via ORAL
  Filled 2021-07-11: qty 1

## 2021-07-11 MED ORDER — ACETAMINOPHEN 325 MG PO TABS
650.0000 mg | ORAL_TABLET | Freq: Once | ORAL | Status: AC
  Administered 2021-07-11: 650 mg via ORAL
  Filled 2021-07-11: qty 2

## 2021-07-11 MED ORDER — LACTATED RINGERS IV SOLN: INTRAVENOUS | Status: DC | PRN

## 2021-07-11 MED ORDER — TRAMADOL HCL 50 MG PO TABS: 50.0000 mg | ORAL_TABLET | Freq: Four times a day (QID) | ORAL | 0 refills | Status: AC | PRN

## 2021-07-11 MED ORDER — CEFAZOLIN SODIUM-DEXTROSE 2-3 GM-%(50ML) IV SOLR
INTRAVENOUS | Status: DC | PRN
  Administered 2021-07-11: 2 g via INTRAVENOUS

## 2021-07-11 MED ORDER — FENTANYL CITRATE (PF) 100 MCG/2ML IJ SOLN
25.0000 ug | INTRAMUSCULAR | Status: DC | PRN
  Administered 2021-07-11 (×3): 50 ug via INTRAVENOUS

## 2021-07-11 MED ORDER — LIDOCAINE 2% (20 MG/ML) 5 ML SYRINGE
INTRAMUSCULAR | Status: DC | PRN
  Administered 2021-07-11: 100 mg via INTRAVENOUS

## 2021-07-11 MED ORDER — MIDAZOLAM HCL 5 MG/5ML IJ SOLN
INTRAMUSCULAR | Status: DC | PRN
  Administered 2021-07-11: 2 mg via INTRAVENOUS

## 2021-07-11 MED ORDER — LIDOCAINE 2% (20 MG/ML) 5 ML SYRINGE
INTRAMUSCULAR | Status: AC
  Filled 2021-07-11: qty 5

## 2021-07-11 MED ORDER — SODIUM CHLORIDE 0.9 % IV BOLUS
1000.0000 mL | Freq: Once | INTRAVENOUS | Status: AC
  Administered 2021-07-11: 1000 mL via INTRAVENOUS

## 2021-07-11 MED ORDER — AMISULPRIDE (ANTIEMETIC) 5 MG/2ML IV SOLN: 10.0000 mg | Freq: Once | INTRAVENOUS | Status: DC | PRN

## 2021-07-11 MED ORDER — MIDAZOLAM HCL 2 MG/2ML IJ SOLN
INTRAMUSCULAR | Status: AC
  Filled 2021-07-11: qty 2

## 2021-07-11 MED ORDER — FENTANYL CITRATE (PF) 100 MCG/2ML IJ SOLN
50.0000 ug | Freq: Once | INTRAMUSCULAR | Status: AC
  Administered 2021-07-11: 50 ug via INTRAVENOUS
  Filled 2021-07-11: qty 2

## 2021-07-11 MED ORDER — OXYCODONE HCL 5 MG/5ML PO SOLN: 5.0000 mg | Freq: Once | ORAL | Status: AC | PRN

## 2021-07-11 MED ORDER — HYDROMORPHONE HCL 1 MG/ML IJ SOLN
1.0000 mg | Freq: Once | INTRAMUSCULAR | Status: AC
Start: 2021-07-11 — End: 2021-07-11
  Administered 2021-07-11: 1 mg via INTRAVENOUS
  Filled 2021-07-11: qty 1

## 2021-07-11 MED ORDER — HYDROMORPHONE HCL 1 MG/ML IJ SOLN
0.5000 mg | Freq: Once | INTRAMUSCULAR | Status: AC
  Administered 2021-07-11: 0.5 mg via INTRAVENOUS
  Filled 2021-07-11: qty 1

## 2021-07-11 MED ORDER — SODIUM CHLORIDE 0.9 % IR SOLN
Status: DC | PRN
  Administered 2021-07-11: 1000 mL
  Administered 2021-07-11: 3000 mL

## 2021-07-11 SURGICAL SUPPLY — 12 items
BAG URO CATCHER STRL LF (MISCELLANEOUS) ×2 IMPLANT
CATH INTERMIT  6FR 70CM (CATHETERS) IMPLANT
CLOTH BEACON ORANGE TIMEOUT ST (SAFETY) ×2 IMPLANT
GLOVE SURG ENC TEXT LTX SZ8 (GLOVE) ×2 IMPLANT
GOWN STRL REUS W/TWL XL LVL3 (GOWN DISPOSABLE) ×2 IMPLANT
GUIDEWIRE ANG ZIPWIRE 038X150 (WIRE) IMPLANT
GUIDEWIRE STR DUAL SENSOR (WIRE) ×4 IMPLANT
KIT TURNOVER KIT A (KITS) ×2 IMPLANT
MANIFOLD NEPTUNE II (INSTRUMENTS) ×2 IMPLANT
PACK CYSTO (CUSTOM PROCEDURE TRAY) ×2 IMPLANT
STENT URET 6FRX24 CONTOUR (STENTS) ×2 IMPLANT
TUBING CONNECTING 10 (TUBING) ×2 IMPLANT

## 2021-07-11 NOTE — ED Triage Notes (Signed)
Patient complains of N/V and L flank pain since this morning, burning when he urinates, no blood. States it feels like prior kidney stones.

## 2021-07-11 NOTE — Anesthesia Preprocedure Evaluation (Signed)
Anesthesia Evaluation  Patient identified by MRN, date of birth, ID band Patient awake    Reviewed: Allergy & Precautions, NPO status , Patient's Chart, lab work & pertinent test results  History of Anesthesia Complications (+) PONV and history of anesthetic complications  Airway Mallampati: III  TM Distance: >3 FB Neck ROM: Full    Dental no notable dental hx.    Pulmonary neg pulmonary ROS, former smoker,    Pulmonary exam normal        Cardiovascular Normal cardiovascular exam  Hx of SVT s/p ablation   Neuro/Psych  Headaches, Anxiety    GI/Hepatic negative GI ROS, Neg liver ROS,   Endo/Other  negative endocrine ROS  Renal/GU Renal stones  negative genitourinary   Musculoskeletal negative musculoskeletal ROS (+)   Abdominal   Peds  Hematology negative hematology ROS (+)   Anesthesia Other Findings Day of surgery medications reviewed with patient.  Reproductive/Obstetrics negative OB ROS                             Anesthesia Physical Anesthesia Plan  ASA: 2 and emergent  Anesthesia Plan: General   Post-op Pain Management:    Induction: Intravenous  PONV Risk Score and Plan: 3 and Treatment may vary due to age or medical condition, Ondansetron, Dexamethasone and Midazolam  Airway Management Planned: LMA  Additional Equipment: None  Intra-op Plan:   Post-operative Plan: Extubation in OR  Informed Consent: I have reviewed the patients History and Physical, chart, labs and discussed the procedure including the risks, benefits and alternatives for the proposed anesthesia with the patient or authorized representative who has indicated his/her understanding and acceptance.     Dental advisory given  Plan Discussed with: CRNA  Anesthesia Plan Comments:         Anesthesia Quick Evaluation

## 2021-07-11 NOTE — ED Notes (Signed)
Patient had an episode of near syncope in CT, vitals stable.

## 2021-07-11 NOTE — Op Note (Addendum)
PATIENT:  Thomas Howell  Preoperative diagnosis:  Left ureterolithiasis  Postoperative diagnosis:  Left ureterolithiasis  Procedure:  Cystoscopy Left ureteral stent placement (6 French by 24 cm without tether)  Left ureteroscopy  Surgeon: Marcine Matar, M.D. Assistant: Cherlyn Labella, MD  Anesthesia: General  Complications: None  EBL: Minimal  Specimens: None  Indication: [name] is a 30 y.o. male with left proximal ureteral stone with hydronephrosis and uncontrolled pain. After reviewing the management options for treatment, they have elected to proceed with the above surgical procedure(s). We have discussed the potential benefits and risks of the procedure, side effects of the proposed treatment, the likelihood of the patient achieving the goals of the procedure, and any potential problems that might occur during the procedure or recuperation. Informed consent has been obtained.  Findings: Proximal focal narrowing of the ureter which did accommodate the rigid ureteroscope.  No stone was encountered in the ureter.  Likely pushed retrograde via our irrigation under pressure.  Successful 6 French by 24 cm left ureteral stent placement.  We did not perform laser lithotripsy of the lower pole stone burden.  Description of procedure:   The patient was taken to the operating room and general anesthesia was induced.  The patient was placed in the dorsal lithotomy position, prepped and draped in the usual sterile fashion, and preoperative antibiotics were administered. A preoperative time-out was performed.   A 22 French rigid cystoscope was inserted per urethra with copious lubrication and normal saline running.  Cystourethroscopy was performed.  The patient's urethra was examined and was normal. The bladder was then systematically examined in its entirety. There was no evidence for any bladder tumors, stones, or other mucosal pathology.    Attention then turned to  the left ureteral orifice and a ureteral catheter was used to intubate the ureteral orifice.  Sensor wire was advanced to the level of the renal pelvis under fluoroscopic guidance.  We then emptied the bladder and removed the cystoscope.  Next a semirigid ureteroscope was advanced alongside our wire up the ureter.  A optimal ureteral focal narrowing was appreciated.  We are able to advance the ureteroscope past this.  No stone was encountered in the ureter.  The pressure from the irrigation may have pushed it retrograde up into the collecting system.  We reached the renal pelvis with our semirigid scope.  We then withdrew the ureteroscope and opted to place a ureteral stent.  The wire was then backloaded through the cystoscope and a ureteral stent was advance over the wire using Seldinger technique.  The stent was positioned appropriately under fluoroscopic and cystoscopic guidance.  The wire was then removed with an adequate stent curl noted in the renal pelvis as well as in the bladder.  The bladder was then emptied and the procedure ended.  The patient appeared to tolerate the procedure well and without complications.  The patient was able to be awakened and transferred to the recovery unit in satisfactory condition.     Plan: We will arrange for outpatient follow-up and definitive stone management.  I participated in all aspects of this case and agree with this note.

## 2021-07-11 NOTE — Discharge Instructions (Signed)
You may see some blood in the urine and may have some burning with urination for 48-72 hours. You also may notice that you have to urinate more frequently or urgently after your procedure which is normal.  You should call should you develop an inability urinate, fever > 101, persistent nausea and vomiting that prevents you from eating or drinking to stay hydrated.   You have a stent in place, which will need to be removed in the clinic in ~ 7 days. Take tylenol and motrin for pain. Oxycodone only as needed. Oxybutynin and Flomax can also help with any discomfort. Take only as needed.   If you have a stent, you will likely urinate more frequently and urgently until the stent is removed and you may experience some discomfort/pain in the lower abdomen and flank especially when urinating. You may take pain medication prescribed to you if needed for pain. You may also intermittently have blood in the urine until the stent is removed.  If you have any issues, you should call the office [phone]) to notify us.

## 2021-07-11 NOTE — Consult Note (Addendum)
Urology Consult Note   Requesting Attending Physician:  Ernie Avena, MD Service Providing Consult: Urology  Consulting Attending: Marcine Matar, MD   Reason for Consult:  Nephrolithiasis  HPI: Thomas Howell is seen in consultation for reasons noted above at the request of Ernie Avena, MD for evaluation of nephrolithiasis.  This is a 30 y.o. male with history of nephrolithiasis, rapidly forming stones.  He is status post bilateral ureteroscopic stone extraction with Dr. Berneice Heinrich on 05/06/2021.  He presents to the emergency room with acute onset left flank pain, nausea, vomiting at home.  This began starting at 8:30 AM.  No significant lower urinary tract symptoms.  No hematuria.  No fevers. Both stents removed without issue by Dr. Berneice Heinrich on 05/26/21. Per Dr. Emmaline Life note, he has a mid left ureteral stricture. It does however accommodate scopes and wires.   Past Medical History: Past Medical History:  Diagnosis Date   Complication of anesthesia    Dysrhythmia    hx of SVT ablation in 2019 - DR Lewayne Bunting dismissed by MD    Frequency-urgency syndrome    History of kidney stones    Hx of nausea and vomiting    d/t kidney stone   Hx of sepsis 08/11/2017   due to kidney stone/ hydronephrosis   Left ureteral stone    Migraine    hx of migraines    Pain due to ureteral stent (HCC)    PONV (postoperative nausea and vomiting)    none recently   Renal calculi    bilateral per ct 07-26-2016   Scoliosis of lumbar spine 1994   treated at [facility] until age 53   Wears glasses     Past Surgical History:  Past Surgical History:  Procedure Laterality Date   CYSTOSCOPY W/ RETROGRADES Right 07/06/2015   Procedure: CYSTOSCOPY WITH RETROGRADE PYELOGRAM;  Surgeon: Jerilee Field, MD;  Location: [facility];  Service: Urology;  Laterality: Right;   CYSTOSCOPY W/ URETERAL STENT PLACEMENT Left 08/08/2015   Procedure: CYSTOSCOPY WITH STENT REPLACEMENT;  Surgeon: Jerilee Field, MD;  Location:  [facility];  Service: Urology;  Laterality: Left;   CYSTOSCOPY W/ URETERAL STENT PLACEMENT Left 02/21/2017   Procedure: CYSTOSCOPY WITH RETROGRADE PYELOGRAM/URETERAL LEFT STENT PLACEMENT WITH  LASER;  Surgeon: Bjorn Pippin, MD;  Location: [facility];  Service: Urology;  Laterality: Left;   CYSTOSCOPY W/ URETERAL STENT PLACEMENT Left 08/10/2017   Procedure: CYSTOSCOPY WITH RETROGRADE PYELOGRAM/URETERAL STENT PLACEMENT;  Surgeon: Heloise Purpura, MD;  Location: [facility];  Service: Urology;  Laterality: Left;   CYSTOSCOPY W/ URETERAL STENT PLACEMENT Bilateral 04/19/2021   Procedure: CYSTOSCOPY WITH RETROGRADE PYELOGRAM/URETERAL STENT PLACEMENT;  Surgeon: Sebastian Ache, MD;  Location: [facility];  Service: Urology;  Laterality: Bilateral;   CYSTOSCOPY WITH RETROGRADE PYELOGRAM, URETEROSCOPY AND STENT PLACEMENT Left 06/24/2014   Procedure: CYSTOSCOPY WITH RETROGRADE PYELOGRAM,  AND STENT PLACEMENT;  Surgeon: Magdalene Molly, MD;  Location: [facility];  Service: Urology;  Laterality: Left;   CYSTOSCOPY WITH RETROGRADE PYELOGRAM, URETEROSCOPY AND STENT PLACEMENT Left 07/05/2014   Procedure: CYSTO/LEFT URETEROSCOPY/LEFT RETROGRADE PYELOGRAM/LEFT STENT PLACEMENT;  Surgeon: Jerilee Field, MD;  Location: [facility];  Service: Urology;  Laterality: Left;   CYSTOSCOPY WITH RETROGRADE PYELOGRAM, URETEROSCOPY AND STENT PLACEMENT Left 07/06/2015   Procedure: CYSTOSCOPY WITH RETROGRADE PYELOGRAM, URETEROSCOPY , LASER, STENT PLACEMENT and BASKET EXTRACTION;  Surgeon: Jerilee Field, MD;  Location: [facility];  Service: Urology;  Laterality: Left;   CYSTOSCOPY WITH RETROGRADE PYELOGRAM, URETEROSCOPY AND STENT PLACEMENT Left  08/19/2015   Procedure: CYSTOSCOPY WITH RETROGRADE PYELOGRAM, URETEROSCOPY AND STENT PLACEMENT;  Surgeon: Jerilee FieldMatthew Eskridge, MD;  Location: [facility];  Service: Urology;  Laterality: Left;   CYSTOSCOPY WITH RETROGRADE PYELOGRAM, URETEROSCOPY AND STENT PLACEMENT Left 03/13/2018   Procedure:  CYSTOSCOPY WITH RETROGRADE PYELOGRAM, URETEROSCOPY AND LEFT STENT PLACEMENT;  Surgeon: Bjorn PippinWrenn, John, MD;  Location: [facility];  Service: Urology;  Laterality: Left;   CYSTOSCOPY WITH RETROGRADE PYELOGRAM, URETEROSCOPY AND STENT PLACEMENT Left 03/11/2021   Procedure: CYSTOSCOPY WITH RETROGRADE PYELOGRAM, URETEROSCOPY AND STENT PLACEMENT,STONE EXTRACTION,HOLMIUM LASER;  Surgeon: Marcine Matarahlstedt, Sairah Knobloch, MD;  Location: [facility];  Service: Urology;  Laterality: Left;   CYSTOSCOPY WITH RETROGRADE PYELOGRAM, URETEROSCOPY AND STENT PLACEMENT Bilateral 05/06/2021   Procedure: CYSTOSCOPY WITH RETROGRADE PYELOGRAM, URETEROSCOPY AND STENT REPLACEMENT;  Surgeon: Sebastian AcheManny, Theodore, MD;  Location: [facility];  Service: Urology;  Laterality: Bilateral;  90 MINS   CYSTOSCOPY WITH URETEROSCOPY AND STENT PLACEMENT Left 01/16/2016   Procedure: CYSTOSCOPY WITH LEFT RETROGRADE PYELOGRAM  LEFT DIGITAL URETEROSCOPY AND PLACEMENT LEFT URETERAL STENT;  Surgeon: Jerilee FieldMatthew Eskridge, MD;  Location: [facility];  Service: Urology;  Laterality: Left;   CYSTOSCOPY WITH URETEROSCOPY, STONE BASKETRY AND STENT PLACEMENT Left 02/13/2016   Procedure: CYSTOSCOPY WITH LEFT URETEROSCOPY, HOLMIUM LASER AND STENT PLACEMENT;  Surgeon: Jerilee FieldMatthew Eskridge, MD;  Location[facility];  Service: Urology;  Laterality: Left;   CYSTOSCOPY/RETROGRADE/URETEROSCOPY/STONE EXTRACTION WITH BASKET Left 08/08/2015   Procedure: CYSTOSCOPY/RETROGRADE/URETEROSCOPY/STONE EXTRACTION WITH BASKET;  Surgeon: Jerilee FieldMatthew Eskridge, MD;  [facility] [place];  Service: Urology;  Laterality: Left;   CYSTOSCOPY/URETEROSCOPY/HOLMIUM LASER/STENT PLACEMENT Left 07/30/2016   Procedure: CYSTOSCOPY/URETEROSCOPY/HOLMIUM LASER/STENT PLACEMENT;  Surgeon: Jerilee FieldMatthew Eskridge, MD;  [facility] [place];  Service: Urology;  Laterality: Left;   CYSTOSCOPY/URETEROSCOPY/HOLMIUM LASER/STENT PLACEMENT Left 08/19/2017   Procedure: CYSTOSCOPY/URETEROSCOPY/HOLMIUM LASER/STENT PLACEMENT;  Surgeon:  [name], Matthew, MD;  [facility] [place];  Service: Urology;  Laterality: Left;   CYSTOSCOPY/URETEROSCOPY/HOLMIUM LASER/STENT PLACEMENT N/A 10/13/2018   Procedure: CYSTOSCOPY/RETROGRADE RIGHT URETEROSCOPY/ AND RIGHTSTENT PLACEMENT;  Surgeon: Bjorn PippinWrenn, John, MD;  Location: [facility];  Service: Urology;  Laterality: N/A;   EXTRACORPOREAL SHOCK WAVE LITHOTRIPSY Left 07-07-2015  &  12-26-2014   FOOT CAPSULE RELEASE W/ PERCUTANEOUS HEEL CORD LENGTHENING, TIBIAL TENDON TRANSFER Left 1994   clubfoot   HOLMIUM LASER APPLICATION Left 07/05/2014   Procedure: LASER LITHO;  Surgeon: Jerilee FieldMatthew Eskridge, MD;  [facility] [place];  Service: Urology;  Laterality: Left;   HOLMIUM LASER APPLICATION Left 08/08/2015   Procedure: HOLMIUM LASER  WITH LITHOTRIPSY ;  Surgeon: Jerilee FieldMatthew Eskridge, MD;  [facility] [place];  Service: Urology;  Laterality: Left;   HOLMIUM LASER APPLICATION Left 02/21/2017   Procedure: HOLMIUM LASER APPLICATION;  Surgeon: Bjorn PippinJohn Wrenn, MD;  Location: [facility];  Service: Urology;  Laterality: Left;   HOLMIUM LASER APPLICATION Left 03/13/2018   Procedure: HOLMIUM LASER APPLICATION;  Surgeon: Bjorn PippinWrenn, John, MD;  Location: [facility];  Service: Urology;  Laterality: Left;   HOLMIUM LASER APPLICATION Bilateral 05/06/2021   Procedure: HOLMIUM LASER APPLICATION;  Surgeon: Sebastian AcheManny, Theodore, MD;  Location: [facility];  Service: Urology;  Laterality: Bilateral;   lumpectomy in neck for cat scratch fever      surgery for club foot    LYMPH GLAND EXCISION  2003   neck--  benign   SVT ABLATION N/A 05/22/2018   Procedure: SVT ABLATION;  Surgeon: Marinus Mawaylor, Gregg W, MD;  Location: [facility];  Service: Cardiovascular;  Laterality: N/A;   SVT ABLATION     2019  Medication: Current Facility-Administered Medications  Medication Dose Route Frequency Provider Last Rate Last Admin   promethazine (PHENERGAN) 12.5 mg in sodium chloride 0.9 % 50 mL IVPB  12.5 mg Intravenous Q6H  PRN Petrucelli, Samantha R, PA-C 200 mL/hr at 07/11/21 1436 12.5 mg at 07/11/21 1436   Current Outpatient Medications  Medication Sig Dispense Refill   acetaminophen (TYLENOL) 500 MG tablet Take 1,000 mg by mouth every 8 (eight) hours as needed for headache or moderate pain.     hydrochlorothiazide (HYDRODIURIL) 25 MG tablet Take 25 mg by mouth daily.      hydrOXYzine (ATARAX/VISTARIL) 10 MG tablet Take 1/2-1 tab po qd prn panic attacks and 1 tab po qhs prn sleep (Patient taking differently: Take 10 mg by mouth daily as needed for anxiety.) 45 tablet 0   rizatriptan (MAXALT-MLT) 10 MG disintegrating tablet Take 1 tablet (10 mg total) by mouth daily as needed for migraine. May repeat in 2 hrs if needed; max 2 per 24 hrs 10 tablet 2   sertraline (ZOLOFT) 50 MG tablet Take 1/2 tab po qd x 6 days then one po qd (Patient taking differently: Take 50 mg by mouth daily.) 30 tablet 0   tamsulosin (FLOMAX) 0.4 MG CAPS capsule Take 1 capsule (0.4 mg total) by mouth daily. 7 capsule 0    Allergies: Allergies  Allergen Reactions   Bactrim [Sulfamethoxazole-Trimethoprim] Nausea And Vomiting   Ketamine Other (See Comments)    Did not like the sensation    Social History: Social History   Tobacco Use   Smoking status: Former    Years: 2.00    Types: Cigarettes    Quit date: 07/29/2014    Years since quitting: 6.9   Smokeless tobacco: Never   Tobacco comments:    social smoker  Vaping Use   Vaping Use: Never used  Substance Use Topics   Alcohol use: Not Currently    Alcohol/week: 0.0 standard drinks   Drug use: No    Family History Family History  Problem Relation Age of Onset   Cancer Father    Kidney Stones Mother    Cancer Other    Hypertension Other    Hyperlipidemia Other    Stroke Other    Kidney Stones Brother     Review of Systems 10 systems were reviewed and are negative except as noted specifically in the HPI.  Objective   Vital signs in last 24 hours: BP 125/80    Pulse 81   Temp 97.6 F (36.4 C) (Oral)   Resp 13   Ht 5\' 8"  (1.727 m)   Wt 99.8 kg   SpO2 98%   BMI 33.45 kg/m   Physical Exam General: NAD, A&O, resting, appropriate HEENT: [place]/AT, EOMI, MMM Pulmonary: Normal work of breathing Cardiovascular: HDS, adequate peripheral perfusion Abdomen: Soft, NTTP, nondistended. GU: Voiding spontaneously, Left CVA tenderness Extremities: warm and well perfused Neuro: Appropriate, no focal neurological deficits  Most Recent Labs: Lab Results  Component Value Date   WBC 7.5 07/11/2021   HGB 14.7 07/11/2021   HCT 42.1 07/11/2021   PLT 249 07/11/2021    Lab Results  Component Value Date   NA 142 07/11/2021   K 3.2 (L) 07/11/2021   CL 107 07/11/2021   CO2 16 (L) 07/11/2021   BUN 15 07/11/2021   CREATININE 1.06 07/11/2021   CALCIUM 9.9 07/11/2021    Lab Results  Component Value Date   INR 1.00 05/23/2018   APTT 30 05/23/2018  Urine Culture: @[id number] (laburin,org,[id number])@   IMAGING: CT Renal Stone Study  Result Date: 07/11/2021 CLINICAL DATA:  Left flank pain since this morning. History of nephrolithiasis. EXAM: CT ABDOMEN AND PELVIS WITHOUT CONTRAST TECHNIQUE: Multidetector CT imaging of the abdomen and pelvis was performed following the standard protocol without IV contrast. COMPARISON:  04/19/2021 CT abdomen/pelvis FINDINGS: Lower chest: No significant pulmonary nodules or acute consolidative airspace disease. Hepatobiliary: Diffuse hepatic steatosis. No liver masses. Normal gallbladder with no radiopaque cholelithiasis. No biliary ductal dilatation. Pancreas: Normal, with no mass or duct dilation. Spleen: Normal size. No mass. Adrenals/Urinary Tract: Normal adrenals. Obstructing 3 mm left lumbar ureteral stone with severe left hydroureteronephrosis. The left lumbar ureteral stone is mildly decreased from 5 mm on 04/19/2021 CT. The severe left hydronephrosis is similar. No right hydronephrosis. Numerous nonobstructing stones  scattered throughout both kidneys, largest 4 mm in the upper right kidney and 11 mm in the lower left kidney. No contour deforming renal masses. Normal caliber right ureter. No additional ureteral stones. Normal bladder. Stomach/Bowel: Normal non-distended stomach. Normal caliber small bowel with no small bowel wall thickening. Normal appendix. Cecum located in the midline deep pelvis. Ascending colon located in medial left abdomen. Small bowel loops predominantly located in the right abdomen. Findings suggest congenital bowel malrotation. No large bowel wall thickening, diverticulosis or acute pericolonic fat stranding. Vascular/Lymphatic: Normal caliber abdominal aorta. No pathologically enlarged lymph nodes in the abdomen or pelvis. Reproductive: Normal size prostate. Other: No pneumoperitoneum, ascites or focal fluid collection. Musculoskeletal: No aggressive appearing focal osseous lesions. Chronic moderate levocurvature of the lumbar spine. IMPRESSION: 1. Persistent obstructing 3 mm left lumbar ureteral stone, mildly decreased from 5 mm on 04/19/2021 CT with similar severe left hydroureteronephrosis. 2. Numerous nonobstructing stones scattered throughout both kidneys. 3. Diffuse hepatic steatosis. 4. Findings suggestive of congenital bowel malrotation. Electronically Signed   By: 06/19/2021 M.D.   On: 07/11/2021 13:36    ------  Assessment:  30 y.o. male with history of recurrent fast forming stones presenting with left flank pain, N/V, 60mm left mid ureteral stone with hydronephrosis.   Pain has not been well controlled In the ER despite receiving,tylenol, otradol, dilaudid, fentanyl. Flomax.   He has received zofran and phenergan in the ER as well. And 2L crystalloid bolus.   Given uncontrolled pain,, nausea, vomiting.  We will proceed with cystoscopy, left ureteroscopic stone extraction, left ureteral stent placement.  Patient has been consented.  He understands the risks and benefits which  include but are not limited to hematuria, damage to the lining of the urinary system, perforation of the urinary system, inability to advance a stent in which case he may require nephrostomy tube versus continued trial of stone passage.  He understands and would like to proceed.   Recommendations: -Please keep patient n.p.o. and swab for COVID - Posted for cystoscopy left ureteroscopic stone extraction with laser lithotripsy, left ureteral stent placement with Dr. 1m   Thank you for this consult. Please contact the urology consult pager with any further questions/concerns.  I have interviewed the pt, reviewed labs/imaging studies and discussed w/ Dr Retta Diones. I agree with the plan as well as the above note.

## 2021-07-11 NOTE — Anesthesia Procedure Notes (Signed)
Procedure Name: LMA Insertion Date/Time: 07/11/2021 9:01 PM Performed by: Ponciano Ort, CRNA Pre-anesthesia Checklist: Patient identified, Emergency Drugs available, Suction available and Patient being monitored Patient Re-evaluated:Patient Re-evaluated prior to induction Oxygen Delivery Method: Circle system utilized Preoxygenation: Pre-oxygenation with 100% oxygen Induction Type: IV induction LMA: LMA inserted LMA Size: 4.0 Number of attempts: 1 Placement Confirmation: positive ETCO2 and breath sounds checked- equal and bilateral Tube secured with: Tape Dental Injury: Teeth and Oropharynx as per pre-operative assessment

## 2021-07-11 NOTE — ED Provider Notes (Signed)
[facility]-EMERGENCY DEPT Provider Note   CSN: [id number] Arrival date & time: 07/11/21  1253     History Chief Complaint  Patient presents with   Flank Pain     Thomas Howell is a 30 y.o. male with a hx of ADHD, migraines, and kidney stones who presents to the emergency department with complaints of left flank pain that began at 9 AM this morning.  Patient states the pain is to the left flank, radiates into the left abdomen, and is severe in nature without alleviating or aggravating factors.  He has had associated nausea, vomiting, and dysuria.  This feels like prior kidney stones.  He recently required bilateral stent placement in May of this year with subsequent removal in June.  He denies fever, chills, diarrhea, melena, or hematochezia.  HPI     Past Medical History:  Diagnosis Date   Complication of anesthesia    Dysrhythmia    hx of SVT ablation in 2019 - DR Lewayne Bunting dismissed by MD    Frequency-urgency syndrome    History of kidney stones    Hx of nausea and vomiting    d/t kidney stone   Hx of sepsis 08/11/2017   due to kidney stone/ hydronephrosis   Left ureteral stone    Migraine    hx of migraines    Pain due to ureteral stent (HCC)    PONV (postoperative nausea and vomiting)    none recently   Renal calculi    bilateral per ct 07-26-2016   Scoliosis of lumbar spine 1994   treated at [facility] until age 59   Wears glasses     Patient Active Problem List   Diagnosis Date Noted   Anxiety 06/27/2021   Right ureteral stone 10/13/2018   Acute pyelonephritis 08/14/2018   Bilateral hydronephrosis 08/14/2018   Sepsis (HCC) 08/14/2018   SVT (supraventricular tachycardia) (HCC) 05/22/2018   Attention deficit hyperactivity disorder (ADHD), combined type 04/08/2018   Chronic migraine 02/14/2017   Neck pain 02/04/2016   Hydronephrosis, left 08/19/2015   Hydronephrosis determined by ultrasound    Kidney stone on left side 08/18/2015    Nephrolithiasis 08/18/2015   Kidney stone 07/06/2015   Left ureteral stone 07/06/2015   Analgesic rebound headache 06/05/2014   Migraine headache without aura 05/30/2014    Past Surgical History:  Procedure Laterality Date   CYSTOSCOPY W/ RETROGRADES Right 07/06/2015   Procedure: CYSTOSCOPY WITH RETROGRADE PYELOGRAM;  Surgeon: Jerilee Field, MD;  Location: [facility];  Service: Urology;  Laterality: Right;   CYSTOSCOPY W/ URETERAL STENT PLACEMENT Left 08/08/2015   Procedure: CYSTOSCOPY WITH STENT REPLACEMENT;  Surgeon: Jerilee Field, MD;  Location: [facility];  Service: Urology;  Laterality: Left;   CYSTOSCOPY W/ URETERAL STENT PLACEMENT Left 02/21/2017   Procedure: CYSTOSCOPY WITH RETROGRADE PYELOGRAM/URETERAL LEFT STENT PLACEMENT WITH  LASER;  Surgeon: Bjorn Pippin, MD;  Location: [facility];  Service: Urology;  Laterality: Left;   CYSTOSCOPY W/ URETERAL STENT PLACEMENT Left 08/10/2017   Procedure: CYSTOSCOPY WITH RETROGRADE PYELOGRAM/URETERAL STENT PLACEMENT;  Surgeon: Heloise Purpura, MD;  Location: [facility];  Service: Urology;  Laterality: Left;   CYSTOSCOPY W/ URETERAL STENT PLACEMENT Bilateral 04/19/2021   Procedure: CYSTOSCOPY WITH RETROGRADE PYELOGRAM/URETERAL STENT PLACEMENT;  Surgeon: Sebastian Ache, MD;  Location: [facility];  Service: Urology;  Laterality: Bilateral;   CYSTOSCOPY WITH RETROGRADE PYELOGRAM, URETEROSCOPY AND STENT PLACEMENT Left 06/24/2014   Procedure: CYSTOSCOPY WITH RETROGRADE PYELOGRAM,  AND STENT PLACEMENT;  Surgeon: Magdalene Molly,  MD;  Location: [facility];  Service: Urology;  Laterality: Left;   CYSTOSCOPY WITH RETROGRADE PYELOGRAM, URETEROSCOPY AND STENT PLACEMENT Left 07/05/2014   Procedure: CYSTO/LEFT URETEROSCOPY/LEFT RETROGRADE PYELOGRAM/LEFT STENT PLACEMENT;  Surgeon: Jerilee Field, MD;  Location: [facility];  Service: Urology;  Laterality: Left;   CYSTOSCOPY WITH RETROGRADE PYELOGRAM, URETEROSCOPY AND STENT PLACEMENT Left 07/06/2015    Procedure: CYSTOSCOPY WITH RETROGRADE PYELOGRAM, URETEROSCOPY , LASER, STENT PLACEMENT and BASKET EXTRACTION;  Surgeon: Jerilee Field, MD;  Location: [facility];  Service: Urology;  Laterality: Left;   CYSTOSCOPY WITH RETROGRADE PYELOGRAM, URETEROSCOPY AND STENT PLACEMENT Left 08/19/2015   Procedure: CYSTOSCOPY WITH RETROGRADE PYELOGRAM, URETEROSCOPY AND STENT PLACEMENT;  Surgeon: Jerilee Field, MD;  Location: [facility];  Service: Urology;  Laterality: Left;   CYSTOSCOPY WITH RETROGRADE PYELOGRAM, URETEROSCOPY AND STENT PLACEMENT Left 03/13/2018   Procedure: CYSTOSCOPY WITH RETROGRADE PYELOGRAM, URETEROSCOPY AND LEFT STENT PLACEMENT;  Surgeon: Bjorn Pippin, MD;  Location: [facility];  Service: Urology;  Laterality: Left;   CYSTOSCOPY WITH RETROGRADE PYELOGRAM, URETEROSCOPY AND STENT PLACEMENT Left 03/11/2021   Procedure: CYSTOSCOPY WITH RETROGRADE PYELOGRAM, URETEROSCOPY AND STENT PLACEMENT,STONE EXTRACTION,HOLMIUM LASER;  Surgeon: Marcine Matar, MD;  Location: [facility];  Service: Urology;  Laterality: Left;   CYSTOSCOPY WITH RETROGRADE PYELOGRAM, URETEROSCOPY AND STENT PLACEMENT Bilateral 05/06/2021   Procedure: CYSTOSCOPY WITH RETROGRADE PYELOGRAM, URETEROSCOPY AND STENT REPLACEMENT;  Surgeon: Sebastian Ache, MD;  Location: [facility];  Service: Urology;  Laterality: Bilateral;  90 MINS   CYSTOSCOPY WITH URETEROSCOPY AND STENT PLACEMENT Left 01/16/2016   Procedure: CYSTOSCOPY WITH LEFT RETROGRADE PYELOGRAM  LEFT DIGITAL URETEROSCOPY AND PLACEMENT LEFT URETERAL STENT;  Surgeon: Jerilee Field, MD;  Location: [facility];  Service: Urology;  Laterality: Left;   CYSTOSCOPY WITH URETEROSCOPY, STONE BASKETRY AND STENT PLACEMENT Left 02/13/2016   Procedure: CYSTOSCOPY WITH LEFT URETEROSCOPY, HOLMIUM LASER AND STENT PLACEMENT;  Surgeon: Jerilee Field, MD;  Location: [facility];  Service: Urology;  Laterality: Left;   CYSTOSCOPY/RETROGRADE/URETEROSCOPY/STONE EXTRACTION WITH BASKET Left 08/08/2015   Procedure:  CYSTOSCOPY/RETROGRADE/URETEROSCOPY/STONE EXTRACTION WITH BASKET;  Surgeon: Jerilee Field, MD;  Location: [facility];  Service: Urology;  Laterality: Left;   CYSTOSCOPY/URETEROSCOPY/HOLMIUM LASER/STENT PLACEMENT Left 07/30/2016   Procedure: CYSTOSCOPY/URETEROSCOPY/HOLMIUM LASER/STENT PLACEMENT;  Surgeon: Jerilee Field, MD;  Location: [facility];  Service: Urology;  Laterality: Left;   CYSTOSCOPY/URETEROSCOPY/HOLMIUM LASER/STENT PLACEMENT Left 08/19/2017   Procedure: CYSTOSCOPY/URETEROSCOPY/HOLMIUM LASER/STENT PLACEMENT;  Surgeon: Jerilee Field, MD;  Location: [facility];  Service: Urology;  Laterality: Left;   CYSTOSCOPY/URETEROSCOPY/HOLMIUM LASER/STENT PLACEMENT N/A 10/13/2018   Procedure: CYSTOSCOPY/RETROGRADE RIGHT URETEROSCOPY/ AND RIGHTSTENT PLACEMENT;  Surgeon: Bjorn Pippin, MD;  Location: [facility];  Service: Urology;  Laterality: N/A;   EXTRACORPOREAL SHOCK WAVE LITHOTRIPSY Left 07-07-2015  &  12-26-2014   FOOT CAPSULE RELEASE W/ PERCUTANEOUS HEEL CORD LENGTHENING, TIBIAL TENDON TRANSFER Left 1994   clubfoot   HOLMIUM LASER APPLICATION Left 07/05/2014   Procedure: LASER LITHO;  Surgeon: Jerilee Field, MD;  Location: [facility];  Service: Urology;  Laterality: Left;   HOLMIUM LASER APPLICATION Left 08/08/2015   Procedure: HOLMIUM LASER  WITH LITHOTRIPSY ;  Surgeon: Jerilee Field, MD;  Location: [facility];  Service: Urology;  Laterality: Left;   HOLMIUM LASER APPLICATION Left 02/21/2017   Procedure: HOLMIUM LASER APPLICATION;  Surgeon: Bjorn Pippin, MD;  Location: [facility];  Service: Urology;  Laterality: Left;   HOLMIUM LASER APPLICATION Left 03/13/2018   Procedure: HOLMIUM LASER APPLICATION;  Surgeon: Bjorn Pippin, MD;  Location: [facility];  Service: Urology;  Laterality: Left;   HOLMIUM LASER APPLICATION Bilateral 05/06/2021   Procedure: HOLMIUM LASER APPLICATION;  Surgeon: Sebastian AcheManny, Theodore, MD;  Location: [facility];   Service: Urology;  Laterality: Bilateral;   lumpectomy in neck for cat scratch fever      surgery for club foot    LYMPH GLAND EXCISION  2003   neck--  benign   SVT ABLATION N/A 05/22/2018   Procedure: SVT ABLATION;  Surgeon: Marinus Mawaylor, Gregg W, MD;  Location: [facility];  Service: Cardiovascular;  Laterality: N/A;   SVT ABLATION     2019        Family History  Problem Relation Age of Onset   Cancer Father    Kidney Stones Mother    Cancer Other    Hypertension Other    Hyperlipidemia Other    Stroke Other    Kidney Stones Brother     Social History   Tobacco Use   Smoking status: Former    Years: 2.00    Types: Cigarettes    Quit date: 07/29/2014    Years since quitting: 6.9   Smokeless tobacco: Never   Tobacco comments:    social smoker  Vaping Use   Vaping Use: Never used  Substance Use Topics   Alcohol use: Not Currently    Alcohol/week: 0.0 standard drinks   Drug use: No    Home Medications Prior to Admission medications   Medication Sig Start Date End Date Taking? Authorizing Provider  acetaminophen (TYLENOL) 500 MG tablet Take 1,000 mg by mouth every 8 (eight) hours as needed for headache.    [provider]  hydrochlorothiazide (HYDRODIURIL) 25 MG tablet Take 25 mg by mouth daily.     [provider]  hydrOXYzine (ATARAX/VISTARIL) 10 MG tablet Take 1/2-1 tab po qd prn panic attacks and 1 tab po qhs prn sleep 06/26/21   Campbell RichesHoskins, Carolyn C, NP  rizatriptan (MAXALT-MLT) 10 MG disintegrating tablet Take 1 tablet (10 mg total) by mouth daily as needed for migraine. May repeat in 2 hrs if needed; max 2 per 24 hrs 06/26/21   Campbell RichesHoskins, Carolyn C, NP  sertraline (ZOLOFT) 50 MG tablet Take 1/2 tab po qd x 6 days then one po qd 06/26/21   Campbell RichesHoskins, Carolyn C, NP  tamsulosin (FLOMAX) 0.4 MG CAPS capsule Take 1 capsule (0.4 mg total) by mouth daily. 04/03/21   Fayrene Helperran, Bowie, PA-C    Allergies    Bactrim [sulfamethoxazole-trimethoprim] and Ketamine  Review  of Systems   Review of Systems  Constitutional:  Negative for chills and fever.  Respiratory:  Negative for shortness of breath.   Cardiovascular:  Negative for chest pain.  Gastrointestinal:  Positive for abdominal pain, nausea and vomiting. Negative for diarrhea.  Genitourinary:  Positive for dysuria and flank pain.  Neurological:  Negative for syncope.  All other systems reviewed and are negative.  Physical Exam Updated Vital Signs BP 124/82 (BP Location: Left Arm)   Pulse (!) 110   Temp 97.6 F (36.4 C) (Oral)   Resp 18   Ht 5\' 8"  (1.727 m)   Wt 99.8 kg   SpO2 98%   BMI 33.45 kg/m   Physical Exam Vitals and nursing note reviewed.  Constitutional:      Appearance: He is well-developed. He is not toxic-appearing.     Comments: Patient with active emesis on assessment.  HENT:     Head: Normocephalic and atraumatic.  Eyes:     General:  Right eye: No discharge.        Left eye: No discharge.     Conjunctiva/sclera: Conjunctivae normal.  Cardiovascular:     Rate and Rhythm: Regular rhythm. Tachycardia present.  Pulmonary:     Effort: Pulmonary effort is normal. No respiratory distress.     Breath sounds: Normal breath sounds. No wheezing, rhonchi or rales.  Abdominal:     General: There is no distension.     Palpations: Abdomen is soft.     Tenderness: There is no abdominal tenderness. There is left CVA tenderness. There is no right CVA tenderness, guarding or rebound.  Musculoskeletal:     Cervical back: Neck supple.  Skin:    General: Skin is warm and dry.     Findings: No rash.  Neurological:     Mental Status: He is alert.     Comments: Clear speech.   Psychiatric:        Behavior: Behavior normal.    ED Results / Procedures / Treatments   Labs (all labs ordered are listed, but only abnormal results are displayed) Labs Reviewed  CBC WITH DIFFERENTIAL/PLATELET  BASIC METABOLIC PANEL  URINALYSIS, ROUTINE W REFLEX MICROSCOPIC     EKG None  Radiology CT Renal Stone Study  Result Date: 07/11/2021 CLINICAL DATA:  Left flank pain since this morning. History of nephrolithiasis. EXAM: CT ABDOMEN AND PELVIS WITHOUT CONTRAST TECHNIQUE: Multidetector CT imaging of the abdomen and pelvis was performed following the standard protocol without IV contrast. COMPARISON:  04/19/2021 CT abdomen/pelvis FINDINGS: Lower chest: No significant pulmonary nodules or acute consolidative airspace disease. Hepatobiliary: Diffuse hepatic steatosis. No liver masses. Normal gallbladder with no radiopaque cholelithiasis. No biliary ductal dilatation. Pancreas: Normal, with no mass or duct dilation. Spleen: Normal size. No mass. Adrenals/Urinary Tract: Normal adrenals. Obstructing 3 mm left lumbar ureteral stone with severe left hydroureteronephrosis. The left lumbar ureteral stone is mildly decreased from 5 mm on 04/19/2021 CT. The severe left hydronephrosis is similar. No right hydronephrosis. Numerous nonobstructing stones scattered throughout both kidneys, largest 4 mm in the upper right kidney and 11 mm in the lower left kidney. No contour deforming renal masses. Normal caliber right ureter. No additional ureteral stones. Normal bladder. Stomach/Bowel: Normal non-distended stomach. Normal caliber small bowel with no small bowel wall thickening. Normal appendix. Cecum located in the midline deep pelvis. Ascending colon located in medial left abdomen. Small bowel loops predominantly located in the right abdomen. Findings suggest congenital bowel malrotation. No large bowel wall thickening, diverticulosis or acute pericolonic fat stranding. Vascular/Lymphatic: Normal caliber abdominal aorta. No pathologically enlarged lymph nodes in the abdomen or pelvis. Reproductive: Normal size prostate. Other: No pneumoperitoneum, ascites or focal fluid collection. Musculoskeletal: No aggressive appearing focal osseous lesions. Chronic moderate levocurvature of the lumbar  spine. IMPRESSION: 1. Persistent obstructing 3 mm left lumbar ureteral stone, mildly decreased from 5 mm on 04/19/2021 CT with similar severe left hydroureteronephrosis. 2. Numerous nonobstructing stones scattered throughout both kidneys. 3. Diffuse hepatic steatosis. 4. Findings suggestive of congenital bowel malrotation. Electronically Signed   By: Delbert Phenix M.D.   On: 07/11/2021 13:36    Procedures Procedures   Medications Ordered in ED Medications  ondansetron (ZOFRAN-ODT) disintegrating tablet 4 mg (4 mg Oral Given 07/11/21 1308)    ED Course  I have reviewed the triage vital signs and the nursing notes.  Pertinent labs & imaging results that were available during my care of the patient were reviewed by me and considered in my medical decision  making (see chart for details).    MDM Rules/Calculators/A&P                           Patient presents to the ED with complaints of left flank pain.  Nontoxic, appears uncomfortable, actively vomiting.  Vitals notable for tachycardia.  Left CVA tenderness noted.  No abdominal peritoneal signs on exam.  Additional history obtained:  Additional history obtained from chart review & nursing note review.  Bilateral stent placement in May 2022. History of multiple prior kidney stones.  Lab Tests:  I Ordered, reviewed, and interpreted labs, which included:  CBC: Unremarkable.  BMP: low bicarb with mild anion gap elevation- suspect dehydration related with vomiting- receiving fluids.  UA: Ketonuria.   Imaging Studies ordered:  I ordered imaging studies which included CT renal stone study, I independently reviewed, formal radiology impression shows:  1. Persistent obstructing 3 mm left lumbar ureteral stone, mildly decreased from 5 mm on 04/19/2021 CT with similar severe left hydroureteronephrosis. 2. Numerous nonobstructing stones scattered throughout both kidneys. 3. Diffuse hepatic steatosis. 4. Findings suggestive of congenital bowel  malrotation  ED Course:  14:05: Patient with continued active vomiting- phenergan ordered.   14:39: RE-EVAL: Patient remains very uncomfortable- additional dilaudid ordered.    15:54: CONSULT: Discussed w/ urology, Dr. Cherlyn Labella - recommends tylenol, toradol, flomax, & additional narcotics. Okay to DC if pain improved.   17:30: RE-EVAL: Patient remains very uncomfortable, states he has had mild pain relief overall, dilaudid was the most helpful- will re-dose.   17:35: CONSULT: Re-discussed with Dr. Corky Sox- will come see patient in the ED.   19:30: CONSULT: Re-discussed with Dr. Corky Sox- plan for OR for stent placement.   Portions of this note were generated with Scientist, clinical (histocompatibility and immunogenetics). Dictation errors may occur despite best attempts at proofreading.   Final Clinical Impression(s) / ED Diagnoses Final diagnoses:  Kidney stone    Rx / DC Orders ED Discharge Orders     None        Desmond Lope 07/11/21 1938    Ernie Avena, MD 07/11/21 2218

## 2021-07-11 NOTE — ED Provider Notes (Signed)
Emergency Medicine Provider Triage Evaluation Note  DEMERE DOTZLER , a 30 y.o. male  was evaluated in triage.  Pt complains of left flank pain radiating into the left abdomen since 9AM. Associated sxs include N/V & dysuria. Hx of kidney stone, required stent placement in May- removed in June.  Review of Systems  Positive: N/V, flank pain, dysuria Negative: Fever, diarrhea  Physical Exam  BP 124/82 (BP Location: Left Arm)   Pulse (!) 110   Temp 97.6 F (36.4 C) (Oral)   Resp 18   SpO2 98%  Gen:   Awake   Resp:  Normal effort  MSK:   Moves extremities without difficulty  Other:  Actively vomiting, L cva tenderness. No abdominal tenderness or peritoneal signs.   Medical Decision Making  Medically screening exam initiated at 1:04 PM.  Appropriate orders placed.  CHARLI LIBERATORE was informed that the remainder of the evaluation will be completed by another provider, this initial triage assessment does not replace that evaluation, and the importance of remaining in the ED until their evaluation is complete.  Flank pain   Desmond Lope 07/11/21 1305    Wynetta Fines, MD 07/11/21 1505

## 2021-07-11 NOTE — Transfer of Care (Signed)
Immediate Anesthesia Transfer of Care Note  Patient: Thomas Howell  Procedure(s) Performed: CYSTOSCOPY WITH RETROGRADE PYELOGRAM/URETEROSCOPY/URETERAL STENT PLACEMENT (Left)  Patient Location: PACU  Anesthesia Type:General  Level of Consciousness: awake, alert , oriented and patient cooperative  Airway & Oxygen Therapy: Patient Spontanous Breathing and Patient connected to face mask oxygen  Post-op Assessment: Report given to RN and Post -op Vital signs reviewed and stable  Post vital signs: Reviewed and stable  Last Vitals:  Vitals Value Taken Time  BP 117/73 07/11/21 2137  Temp    Pulse 79 07/11/21 2138  Resp 14 07/11/21 2138  SpO2 100 % 07/11/21 2138  Vitals shown include unvalidated device data.  Last Pain:  Vitals:   07/11/21 1614  TempSrc:   PainSc: 10-Worst pain ever         Complications: No notable events documented.

## 2021-07-13 ENCOUNTER — Encounter (HOSPITAL_COMMUNITY): Payer: Self-pay | Admitting: Urology

## 2021-07-13 NOTE — Anesthesia Postprocedure Evaluation (Signed)
Anesthesia Post Note  Patient: Thomas Howell  Procedure(s) Performed: CYSTOSCOPY WITH RETROGRADE PYELOGRAM/URETEROSCOPY/URETERAL STENT PLACEMENT (Left)     Patient location during evaluation: PACU Anesthesia Type: General Level of consciousness: awake and alert and oriented Pain management: pain level controlled Vital Signs Assessment: post-procedure vital signs reviewed and stable Respiratory status: spontaneous breathing, nonlabored ventilation and respiratory function stable Cardiovascular status: blood pressure returned to baseline Postop Assessment: no apparent nausea or vomiting Anesthetic complications: no   No notable events documented.  Last Vitals:  Vitals:   07/11/21 2221 07/11/21 2225  BP:  136/90  Pulse: 98 87  Resp: 18 18  Temp: (!) 36.4 C   SpO2: 98%     Last Pain:  Vitals:   07/11/21 2225  TempSrc:   PainSc: 5    Pain Goal:                   Kaylyn Layer

## 2021-07-27 DIAGNOSIS — N200 Calculus of kidney: Secondary | ICD-10-CM | POA: Diagnosis not present

## 2021-07-30 ENCOUNTER — Other Ambulatory Visit: Payer: Self-pay | Admitting: Urology

## 2021-07-31 ENCOUNTER — Ambulatory Visit: Payer: BC Managed Care – PPO | Admitting: Nurse Practitioner

## 2021-07-31 ENCOUNTER — Encounter: Payer: Self-pay | Admitting: Nurse Practitioner

## 2021-08-03 ENCOUNTER — Encounter (HOSPITAL_BASED_OUTPATIENT_CLINIC_OR_DEPARTMENT_OTHER): Payer: Self-pay | Admitting: Urology

## 2021-08-03 ENCOUNTER — Other Ambulatory Visit: Payer: Self-pay

## 2021-08-03 NOTE — Progress Notes (Addendum)
Spoke w/ via phone for pre-op interview---pt Lab needs dos----      none         Lab results------ekg 05-04-2021 epic, ep study 05-22-2018 epic, lov dr Ladona Ridgel 06-19-2018 f/u prn COVID test -----patient states asymptomatic no test needed Arrive at -------530 am 08-04-2021 NPO after MN NO Solid Food.  Clear liquids from MN until---430 am then npo Medications to take morning of surgery -----hydroxyazine prn Diabetic medication -----n/a Patient instructed no nail polish to be worn day of surgery Patient instructed to bring photo id and insurance card day of surgery Patient aware to have Driver (ride ) / caregiver    for 24 hours after surgery  Patient Special Instructions -----none Pre-Op special Istructions -----none Patient verbalized understanding of instructions that were given at this phone interview. Patient denies shortness of breath, chest pain, fever, cough at this phone interview.

## 2021-08-03 NOTE — H&P (Signed)
Office Visit Report     07/27/2021   --------------------------------------------------------------------------------   Thomas Howell  MRN: [id number]  DOB: [birth year], 30 year old Male  SSN: [id number]   PRIMARY CARE:  W Rosemary Holms, MD  REFERRING:  Fredia Sorrow  PROVIDER:  Festus Aloe, M.D.  LOCATION:  [facility] [phone]     --------------------------------------------------------------------------------   CC/HPI: F/u -   1 - Recurrent BILATERAL Renal / Ureteral Stones- s/p numerous stone procedures including URS, SWL, MET x many -   Recent Course:  03/22 left prox stone s/p left URS/HLL/stent - stent removed in office  05/22 - bilateral staged URS/HLL stent to stone free for bilateral ureteral stones - stents removed in office  07/22 - tiny left prox stone and 10 mm LLP stone on CT, left prox sx on URS - stent placed   2 - Medical Stone Disease / Mild Renal Leak Hyperrcalciurria:  Eval 2017: Composition 80% CaPO4, 20% CaOx; 24 Hr Urines - mild high Ca 213 ==> HCTZ  06/22 labs: Cr 0.7, Ca 9.9, PTH nl, Uric acid nl, lytes nl, CO nl   3 - Chronic Mild Left Hydro / Stricture - chronic left caliectasis and proximal mild ureteral narrowing (still accomodates ureteroscope / wires) -  -04/22 [place] after URS, stent pull with worsening left HYDRO  -07/22 - tiny left prox stone and 10 mm LLP stone on CT, left prox sx on URS - stent placed     Today "Thomas Howell" is seen for the above. He left nephrology and now works at [employer] ED.     ALLERGIES: Adhesive tape Codeine Derivatives Hycodan SYRP    MEDICATIONS: Hydrochlorothiazide  Maxalt Mlt 10 mg tablet,disintegrating PRN  Tylenol     GU PSH: Cysto Remove Stent FB Sim - 05/26/2021, 03/25/2021, 2019, 2018 Cysto Uretero Lithotripsy - 2016, 2016, 2015 Cystoscopy Insert Stent, Bilateral - 04/19/2021, Right - 2019, Left - 2018, 2017, 2016, 2016, 2016, 2015, 2015 Cystoscopy Ureteroscopy, Right - 2019, 2017,  2016 ESWL - 2016, 2016 Remove Pelvic Lymph Nodes - 2012 Ureteroscopic laser litho, Bilateral - 05/06/2021, Left - 03/11/2021, Left - 2021, Bilateral - 2019, Left - 2019, Left - 2018, Left - 2018, Left - 2017, 2017 Ureteroscopic stone removal, Left - 2018       PSH Notes: Cystoscopy Ureteroscopy Lithotripsy Incl Insert Indwelling Ureter Stent, Cystoscopy With Insertion Of Ureteral Stent Left, Cystoscopy With Ureteroscopy Left, Cystoscopy With Ureteroscopy Left, Cystoscopy With Insertion Of Ureteral Stent Left, Cystoscopy With Insertion Of Ureteral Stent Left, Cystoscopy With Ureteroscopy With Lithotripsy, Cystoscopy With Ureteroscopy With Lithotripsy, Cystoscopy With Insertion Of Ureteral Stent Left, Lithotripsy, Lithotripsy, Cystoscopy With Ureteroscopy With Lithotripsy, Cystoscopy With Insertion Of Ureteral Stent Left, Cystoscopy With Insertion Of Ureteral Stent Left, Lymphadenectomy, Foot Surgery   NON-GU PSH: None   GU PMH: Hydronephrosis Unspec - 05/26/2021, Hydronephrosis, left, - 2017 Renal calculus - 05/26/2021, - 03/25/2021    NON-GU PMH: Hypercalciuria - 05/26/2021, - 2020    FAMILY HISTORY: Blood In Urine - Mother, Brother Family Health Status Number - Runs In Family nephrolithiasis - Runs In Family   SOCIAL HISTORY: Marital Status: Single Preferred Language: English; Ethnicity: Not Hispanic Or Latino; Race: White Current Smoking Status: Patient has never smoked.  Has never drank.  Drinks 1 caffeinated drink per day. Patient's occupation Tourist information centre manager.     Notes: Current smoker on some days, Alcohol Use, Marital History - Single, Tobacco Use, Caffeine Use, Occupation:   REVIEW OF SYSTEMS:  GU Review Male:   gross hematuria . Patient denies frequent urination, hard to postpone urination, burning/ pain with urination, get up at night to urinate, leakage of urine, stream starts and stops, trouble starting your stream, have to strain to urinate , erection problems, and penile  pain.  Gastrointestinal (Upper):   Patient denies nausea, vomiting, and indigestion/ heartburn.  Gastrointestinal (Lower):   Patient denies diarrhea and constipation.  Constitutional:   Patient denies fever, night sweats, weight loss, and fatigue.  Skin:   Patient denies skin rash/ lesion and itching.  Eyes:   Patient denies blurred vision and double vision.  Ears/ Nose/ Throat:   Patient denies sore throat and sinus problems.  Hematologic/Lymphatic:   Patient denies swollen glands and easy bruising.  Cardiovascular:   Patient denies leg swelling and chest pains.  Respiratory:   Patient denies cough and shortness of breath.  Endocrine:   Patient denies excessive thirst.  Musculoskeletal:   Patient denies joint pain and back pain.  Neurological:   Patient denies headaches and dizziness.  Psychologic:   Patient denies depression and anxiety.   VITAL SIGNS: None   MULTI-SYSTEM PHYSICAL EXAMINATION:    Constitutional: Well-nourished. No physical deformities. Normally developed. Good grooming.  Neck: Neck symmetrical, not swollen. Normal tracheal position.  Respiratory: No labored breathing, no use of accessory muscles.   Cardiovascular: Normal temperature, normal extremity pulses, no swelling, no varicosities.  Skin: No paleness, no jaundice, no cyanosis. No lesion, no ulcer, no rash.  Neurologic / Psychiatric: Oriented to time, oriented to place, oriented to person. No depression, no anxiety, no agitation.  Gastrointestinal: No mass, no tenderness, no rigidity, non obese abdomen.     Complexity of Data:  X-Ray Review: Renal Ultrasound: Reviewed Films. 04/22 C.T. Abdomen/Pelvis: Reviewed Films. 05/22, 07/22    PROCEDURES:          Urinalysis w/Scope Dipstick Dipstick Cont'd Micro  Color: Red Bilirubin: Neg mg/dL WBC/hpf: 6 - 10/hpf  Appearance: Cloudy Ketones: Neg mg/dL RBC/hpf: >60/hpf  Specific Gravity: 1.020 Blood: 3+ ery/uL Bacteria: Many (>50/hpf)  pH: 6.0 Protein: 3+ mg/dL  Cystals: NS (Not Seen)  Glucose: Neg mg/dL Urobilinogen: 0.2 mg/dL Casts: NS (Not Seen)    Nitrites: Neg Trichomonas: Not Present    Leukocyte Esterase: 2+ leu/uL Mucous: Not Present      Epithelial Cells: 0 - 5/hpf      Yeast: NS (Not Seen)      Sperm: Not Present    Notes: qns to spin    ASSESSMENT:      ICD-10 Details  1 GU:   Renal calculus - N20.0 Chronic, Stable - Will plan cysto, left URS, HLL, stent exchange - disc the nature r/b/a.   2   Ureteral calculus - N20.1 Chronic, Stable - Also disc these stones are small but getting hung up in the small area of proximal ureter. He asked about balloon dilation as he used to dilate the grafts. We disc we might need to do a balloon dilation and nature r/b/a and risk of recurrence among others.   3 NON-GU:   Bacteriuria - R82.71 Chronic, Stable   PLAN:            Medications New Meds: Hydrocodone-Acetaminophen 7.5 mg-325 mg tablet 1 tablet PO Q HS PRN   #10  0 Refill(s)    Stop Meds: Ketorolac Tromethamine 10 mg tablet 1 tablet PO Q8 PRN stent discomfort post-operatively Start: 05/11/2021  Discontinue: 07/27/2021  - Reason: The medication cycle was completed.  Orders Labs Urine Culture          Schedule Return Visit/Planned Activity: ASAP - Schedule Surgery          Document Letter(s):  Created for Patient: Clinical Summary         Notes:   cc: Dr. Wolfgang Phoenix     * Signed by Festus Aloe, M.D. on 07/29/21 at 12:27 PM (EDT)*      The information contained in this medical record document is considered private and confidential patient information. This information can only be used for the medical diagnosis and/or medical services that are being provided by the patient's selected caregivers. This information can only be distributed outside of the patient's care if the patient agrees and signs waivers of authorization for this information to be sent to an outside source or route.  Add: Urine cx negative.

## 2021-08-04 ENCOUNTER — Encounter (HOSPITAL_BASED_OUTPATIENT_CLINIC_OR_DEPARTMENT_OTHER): Payer: Self-pay | Admitting: Urology

## 2021-08-04 ENCOUNTER — Ambulatory Visit (HOSPITAL_BASED_OUTPATIENT_CLINIC_OR_DEPARTMENT_OTHER): Payer: BC Managed Care – PPO | Admitting: Certified Registered"

## 2021-08-04 ENCOUNTER — Ambulatory Visit (HOSPITAL_BASED_OUTPATIENT_CLINIC_OR_DEPARTMENT_OTHER)
Admission: RE | Admit: 2021-08-04 | Discharge: 2021-08-04 | Disposition: A | Discharge status: Home / Self Care | Payer: BC Managed Care – PPO | Attending: Urology | Admitting: Urology

## 2021-08-04 ENCOUNTER — Encounter (HOSPITAL_BASED_OUTPATIENT_CLINIC_OR_DEPARTMENT_OTHER)
Admission: RE | Disposition: A | Discharge status: Home / Self Care | Payer: Self-pay | Source: Home / Self Care | Attending: Urology

## 2021-08-04 DIAGNOSIS — F902 Attention-deficit hyperactivity disorder, combined type: Secondary | ICD-10-CM | POA: Diagnosis not present

## 2021-08-04 DIAGNOSIS — Z87442 Personal history of urinary calculi: Secondary | ICD-10-CM | POA: Diagnosis not present

## 2021-08-04 DIAGNOSIS — R8271 Bacteriuria: Secondary | ICD-10-CM | POA: Insufficient documentation

## 2021-08-04 DIAGNOSIS — Z885 Allergy status to narcotic agent status: Secondary | ICD-10-CM | POA: Diagnosis not present

## 2021-08-04 DIAGNOSIS — N202 Calculus of kidney with calculus of ureter: Secondary | ICD-10-CM | POA: Diagnosis not present

## 2021-08-04 DIAGNOSIS — N201 Calculus of ureter: Secondary | ICD-10-CM

## 2021-08-04 DIAGNOSIS — N132 Hydronephrosis with renal and ureteral calculous obstruction: Secondary | ICD-10-CM | POA: Diagnosis not present

## 2021-08-04 DIAGNOSIS — Z79899 Other long term (current) drug therapy: Secondary | ICD-10-CM | POA: Insufficient documentation

## 2021-08-04 DIAGNOSIS — F419 Anxiety disorder, unspecified: Secondary | ICD-10-CM | POA: Diagnosis not present

## 2021-08-04 DIAGNOSIS — I471 Supraventricular tachycardia: Secondary | ICD-10-CM | POA: Diagnosis not present

## 2021-08-04 HISTORY — PX: URETEROSCOPY WITH HOLMIUM LASER LITHOTRIPSY: SHX6645

## 2021-08-04 SURGERY — URETEROSCOPY, WITH LITHOTRIPSY USING HOLMIUM LASER
Anesthesia: General | Site: Ureter | Laterality: Left

## 2021-08-04 MED ORDER — CEFAZOLIN SODIUM-DEXTROSE 2-3 GM-%(50ML) IV SOLR
INTRAVENOUS | Status: DC | PRN
  Administered 2021-08-04: 2 g via INTRAVENOUS

## 2021-08-04 MED ORDER — DEXMEDETOMIDINE (PRECEDEX) IN NS 20 MCG/5ML (4 MCG/ML) IV SYRINGE
PREFILLED_SYRINGE | INTRAVENOUS | Status: DC | PRN
  Administered 2021-08-04 (×3): 4 ug via INTRAVENOUS

## 2021-08-04 MED ORDER — OXYCODONE HCL 5 MG PO TABS
5.0000 mg | ORAL_TABLET | Freq: Once | ORAL | Status: AC | PRN
  Administered 2021-08-04: 5 mg via ORAL

## 2021-08-04 MED ORDER — KETOROLAC TROMETHAMINE 30 MG/ML IJ SOLN
INTRAMUSCULAR | Status: AC
  Filled 2021-08-04: qty 1

## 2021-08-04 MED ORDER — KETOROLAC TROMETHAMINE 30 MG/ML IJ SOLN
30.0000 mg | Freq: Once | INTRAMUSCULAR | Status: AC
  Administered 2021-08-04: 30 mg via INTRAVENOUS

## 2021-08-04 MED ORDER — PROPOFOL 10 MG/ML IV BOLUS
INTRAVENOUS | Status: AC
  Filled 2021-08-04: qty 20

## 2021-08-04 MED ORDER — 0.9 % SODIUM CHLORIDE (POUR BTL) OPTIME
TOPICAL | Status: DC | PRN
  Administered 2021-08-04: 500 mL

## 2021-08-04 MED ORDER — LIDOCAINE 2% (20 MG/ML) 5 ML SYRINGE
INTRAMUSCULAR | Status: DC | PRN
  Administered 2021-08-04: 100 mg via INTRAVENOUS

## 2021-08-04 MED ORDER — ACETAMINOPHEN 160 MG/5ML PO SOLN: 325.0000 mg | ORAL | Status: DC | PRN

## 2021-08-04 MED ORDER — DEXMEDETOMIDINE (PRECEDEX) IN NS 20 MCG/5ML (4 MCG/ML) IV SYRINGE
PREFILLED_SYRINGE | INTRAVENOUS | Status: AC
  Filled 2021-08-04: qty 5

## 2021-08-04 MED ORDER — LIDOCAINE HCL (PF) 2 % IJ SOLN
INTRAMUSCULAR | Status: AC
  Filled 2021-08-04: qty 5

## 2021-08-04 MED ORDER — MIDAZOLAM HCL 5 MG/5ML IJ SOLN
INTRAMUSCULAR | Status: DC | PRN
  Administered 2021-08-04: 2 mg via INTRAVENOUS

## 2021-08-04 MED ORDER — CEFAZOLIN SODIUM-DEXTROSE 2-4 GM/100ML-% IV SOLN
INTRAVENOUS | Status: AC
  Filled 2021-08-04: qty 100

## 2021-08-04 MED ORDER — ACETAMINOPHEN 10 MG/ML IV SOLN
1000.0000 mg | Freq: Once | INTRAVENOUS | Status: DC | PRN
  Administered 2021-08-04: 1000 mg via INTRAVENOUS

## 2021-08-04 MED ORDER — FENTANYL CITRATE (PF) 100 MCG/2ML IJ SOLN
25.0000 ug | INTRAMUSCULAR | Status: DC | PRN
  Administered 2021-08-04 (×4): 25 ug via INTRAVENOUS

## 2021-08-04 MED ORDER — FENTANYL CITRATE (PF) 100 MCG/2ML IJ SOLN
INTRAMUSCULAR | Status: AC
  Filled 2021-08-04: qty 2

## 2021-08-04 MED ORDER — OXYCODONE HCL 5 MG PO TABS
ORAL_TABLET | ORAL | Status: AC
  Filled 2021-08-04: qty 1

## 2021-08-04 MED ORDER — LACTATED RINGERS IV SOLN: INTRAVENOUS | Status: DC

## 2021-08-04 MED ORDER — AMISULPRIDE (ANTIEMETIC) 5 MG/2ML IV SOLN
10.0000 mg | Freq: Once | INTRAVENOUS | Status: DC | PRN
Start: 2021-08-04 — End: 2021-08-04

## 2021-08-04 MED ORDER — DEXAMETHASONE SODIUM PHOSPHATE 10 MG/ML IJ SOLN
INTRAMUSCULAR | Status: DC | PRN
  Administered 2021-08-04: 10 mg via INTRAVENOUS

## 2021-08-04 MED ORDER — ACETAMINOPHEN 10 MG/ML IV SOLN
INTRAVENOUS | Status: AC
  Filled 2021-08-04: qty 100

## 2021-08-04 MED ORDER — IOHEXOL 300 MG/ML  SOLN
INTRAMUSCULAR | Status: DC | PRN
  Administered 2021-08-04: 10 mL

## 2021-08-04 MED ORDER — SODIUM CHLORIDE 0.9 % IR SOLN
Status: DC | PRN
  Administered 2021-08-04: 3000 mL

## 2021-08-04 MED ORDER — PROPOFOL 10 MG/ML IV BOLUS
INTRAVENOUS | Status: DC | PRN
  Administered 2021-08-04: 200 mg via INTRAVENOUS

## 2021-08-04 MED ORDER — PROMETHAZINE HCL 25 MG/ML IJ SOLN: 6.2500 mg | INTRAMUSCULAR | Status: DC | PRN

## 2021-08-04 MED ORDER — FENTANYL CITRATE (PF) 100 MCG/2ML IJ SOLN
INTRAMUSCULAR | Status: DC | PRN
  Administered 2021-08-04: 25 ug via INTRAVENOUS
  Administered 2021-08-04: 50 ug via INTRAVENOUS
  Administered 2021-08-04: 100 ug via INTRAVENOUS
  Administered 2021-08-04: 25 ug via INTRAVENOUS

## 2021-08-04 MED ORDER — ONDANSETRON HCL 4 MG/2ML IJ SOLN
INTRAMUSCULAR | Status: AC
  Filled 2021-08-04: qty 2

## 2021-08-04 MED ORDER — MIDAZOLAM HCL 2 MG/2ML IJ SOLN
INTRAMUSCULAR | Status: AC
  Filled 2021-08-04: qty 2

## 2021-08-04 MED ORDER — ONDANSETRON HCL 4 MG/2ML IJ SOLN
INTRAMUSCULAR | Status: DC | PRN
  Administered 2021-08-04: 4 mg via INTRAVENOUS

## 2021-08-04 MED ORDER — ACETAMINOPHEN 325 MG PO TABS: 325.0000 mg | ORAL_TABLET | ORAL | Status: DC | PRN

## 2021-08-04 MED ORDER — DEXAMETHASONE SODIUM PHOSPHATE 10 MG/ML IJ SOLN
INTRAMUSCULAR | Status: AC
  Filled 2021-08-04: qty 1

## 2021-08-04 MED ORDER — OXYCODONE HCL 5 MG/5ML PO SOLN: 5.0000 mg | Freq: Once | ORAL | Status: AC | PRN

## 2021-08-04 SURGICAL SUPPLY — 26 items
BAG DRAIN URO-CYSTO SKYTR STRL (DRAIN) ×2 IMPLANT
BAG DRN UROCATH (DRAIN) ×1
BASKET ZERO TIP NITINOL 2.4FR (BASKET) ×2 IMPLANT
BSKT STON RTRVL ZERO TP 2.4FR (BASKET) ×1
CATH URET 5FR 28IN CONE TIP (BALLOONS)
CATH URET 5FR 28IN OPEN ENDED (CATHETERS) IMPLANT
CATH URET 5FR 70CM CONE TIP (BALLOONS) IMPLANT
CATH URET DUAL LUMEN 6-10FR 50 (CATHETERS) IMPLANT
CLOTH BEACON ORANGE TIMEOUT ST (SAFETY) ×2 IMPLANT
DRSG TEGADERM 4X4.75 (GAUZE/BANDAGES/DRESSINGS) ×2 IMPLANT
FIBER LASER MOSES 200 DFL (Laser) ×2 IMPLANT
GLOVE SURG ENC MOIS LTX SZ7.5 (GLOVE) ×2 IMPLANT
GLOVE SURG ENC MOIS LTX SZ8 (GLOVE) IMPLANT
GOWN STRL REUS W/TWL LRG LVL3 (GOWN DISPOSABLE) ×2 IMPLANT
GUIDEWIRE ANG ZIPWIRE 038X150 (WIRE) IMPLANT
GUIDEWIRE STR DUAL SENSOR (WIRE) ×2 IMPLANT
GUIDEWIRE ZIPWRE .038 STRAIGHT (WIRE) ×2 IMPLANT
IV NS IRRIG 3000ML ARTHROMATIC (IV SOLUTION) ×4 IMPLANT
KIT TURNOVER CYSTO (KITS) ×2 IMPLANT
MANIFOLD NEPTUNE II (INSTRUMENTS) ×2 IMPLANT
NS IRRIG 500ML POUR BTL (IV SOLUTION) ×2 IMPLANT
PACK CYSTO (CUSTOM PROCEDURE TRAY) ×2 IMPLANT
SHEATH URETERAL 12FRX55CM (UROLOGICAL SUPPLIES) ×2 IMPLANT
STENT URET 6FRX24 CONTOUR (STENTS) ×2 IMPLANT
TUBE CONNECTING 12X1/4 (SUCTIONS) ×2 IMPLANT
TUBING UROLOGY SET (TUBING) ×2 IMPLANT

## 2021-08-04 NOTE — Transfer of Care (Signed)
Immediate Anesthesia Transfer of Care Note  Patient: Thomas Howell  Procedure(s) Performed: URETEROSCOPY WITH basket removal of stones/ STENT exchange/retrograde (Left: Ureter)  Patient Location: PACU  Anesthesia Type:General  Level of Consciousness: awake, alert  and oriented  Airway & Oxygen Therapy: Patient Spontanous Breathing and Patient connected to nasal cannula oxygen  Post-op Assessment: Report given to RN and Post -op Vital signs reviewed and stable  Post vital signs: Reviewed and stable  Last Vitals:  Vitals Value Taken Time  BP 135/95 08/04/21 0845  Temp    Pulse 102 08/04/21 0846  Resp 19 08/04/21 0846  SpO2 99 % 08/04/21 0846  Vitals shown include unvalidated device data.  Last Pain:  Vitals:   08/04/21 0615  TempSrc: Oral  PainSc: 8       Patients Stated Pain Goal: 5 (08/04/21 0615)  Complications: No notable events documented.

## 2021-08-04 NOTE — Discharge Instructions (Addendum)
Ureteral Stent Implantation, Care After This sheet gives you information about how to care for yourself after your procedure. Your health care provider may also give you more specific instructions. If you have problems or questions, contact your health careprovider.  Removal of the stent: Remove the stent on Monday morning, August 10, 2021 by pulling the string as instructed  What can I expect after the procedure? After the procedure, it is common to have: Nausea. Mild pain when you urinate. You may feel this pain in your lower back or lower abdomen. The pain should stop within a few minutes after you urinate. This may last for up to 1 week. A small amount of blood in your urine for several days. Follow these instructions at home: Medicines Take over-the-counter and prescription medicines only as told by your health care provider. If you were prescribed an antibiotic medicine, take it as told by your health care provider. Do not stop taking the antibiotic even if you start to feel better. Do not drive for 24 hours if you were given a sedative during your procedure. Ask your health care provider if the medicine prescribed to you requires you to avoid driving or using heavy machinery. Activity Rest as told by your health care provider. Avoid sitting for a long time without moving. Get up to take short walks every 1-2 hours. This is important to improve blood flow and breathing. Ask for help if you feel weak or unsteady. Return to your normal activities as told by your health care provider. Ask your health care provider what activities are safe for you. General instructions Watch for any blood in your urine. Call your health care provider if the amount of blood in your urine increases. If you have a catheter: Follow instructions from your health care provider about taking care of your catheter and collection bag. Do not take baths, swim, or use a hot tub until your health care provider approves.  Ask your health care provider if you may take showers. You may only be allowed to take sponge baths. Drink enough fluid to keep your urine pale yellow. Do not use any products that contain nicotine or tobacco, such as cigarettes, e-cigarettes, and chewing tobacco. These can delay healing after surgery. If you need help quitting, ask your health care provider. Keep all follow-up visits as told by your health care provider. This is important.   Contact a health care provider if: You have pain that gets worse or does not get better with medicine, especially pain when you urinate. You have difficulty urinating. You feel nauseous or you vomit repeatedly during a period of more than 2 days after the procedure. Get help right away if: Your urine is dark red or has blood clots in it. You are leaking urine (have incontinence). The end of the stent comes out of your urethra. You cannot urinate. You have sudden, sharp, or severe pain in your abdomen or lower back. You have a fever. You have swelling or pain in your legs. You have difficulty breathing. Summary After the procedure, it is common to have mild pain when you urinate that goes away within a few minutes after you urinate. This may last for up to 1 week. Watch for any blood in your urine. Call your health care provider if the amount of blood in your urine increases. Take over-the-counter and prescription medicines only as told by your health care provider. Drink enough fluid to keep your urine pale yellow. This information is  not intended to replace advice given to you by your health care provider. Make sure you discuss any questions you have with your healthcare provider. Document Revised: 09/12/2018 Document Reviewed: 09/13/2018 Elsevier Patient Education  2022 Elsevier Inc.         Post Anesthesia Home Care Instructions  Activity: Get plenty of rest for the remainder of the day. A responsible individual must stay with you for 24  hours following the procedure.  For the next 24 hours, DO NOT: -Drive a car -Advertising copywriter -Drink alcoholic beverages -Take any medication unless instructed by your physician -Make any legal decisions or sign important papers.  Meals: Start with liquid foods such as gelatin or soup. Progress to regular foods as tolerated. Avoid greasy, spicy, heavy foods. If nausea and/or vomiting occur, drink only clear liquids until the nausea and/or vomiting subsides. Call your physician if vomiting continues.  Special Instructions/Symptoms: Your throat may feel dry or sore from the anesthesia or the breathing tube placed in your throat during surgery. If this causes discomfort, gargle with warm salt water. The discomfort should disappear within 24 hours.  If you had a scopolamine patch placed behind your ear for the management of post- operative nausea and/or vomiting:  1. The medication in the patch is effective for 72 hours, after which it should be removed.  Wrap patch in a tissue and discard in the trash. Wash hands thoroughly with soap and water. 2. You may remove the patch earlier than 72 hours if you experience unpleasant side effects which may include dry mouth, dizziness or visual disturbances. 3. Avoid touching the patch. Wash your hands with soap and water after contact with the patch.

## 2021-08-04 NOTE — Anesthesia Preprocedure Evaluation (Addendum)
Anesthesia Evaluation  Patient identified by MRN, date of birth, ID band Patient awake    Reviewed: Allergy & Precautions, NPO status , Patient's Chart, lab work & pertinent test results  History of Anesthesia Complications (+) PONV and history of anesthetic complications  Airway Mallampati: II  TM Distance: >3 FB Neck ROM: Full    Dental  (+) Teeth Intact, Dental Advisory Given   Pulmonary former smoker,    breath sounds clear to auscultation       Cardiovascular  Rhythm:Regular Rate:Normal     Neuro/Psych  Headaches, PSYCHIATRIC DISORDERS Anxiety    GI/Hepatic negative GI ROS, Neg liver ROS,   Endo/Other  negative endocrine ROS  Renal/GU Renal InsufficiencyRenal disease     Musculoskeletal negative musculoskeletal ROS (+)   Abdominal Normal abdominal exam  (+)   Peds  Hematology negative hematology ROS (+)   Anesthesia Other Findings   Reproductive/Obstetrics                            Anesthesia Physical Anesthesia Plan  ASA: 2  Anesthesia Plan: General   Post-op Pain Management:    Induction: Intravenous  PONV Risk Score and Plan: 3 and Ondansetron, Dexamethasone and Midazolam  Airway Management Planned: LMA  Additional Equipment: None  Intra-op Plan:   Post-operative Plan: Extubation in OR  Informed Consent:   Plan Discussed with: CRNA  Anesthesia Plan Comments:         Anesthesia Quick Evaluation

## 2021-08-04 NOTE — Interval H&P Note (Signed)
History and Physical Interval Note:  08/04/2021 7:25 AM  Thomas Howell  has presented today for surgery, with the diagnosis of LEFT URETERAL AND RENAL STONE.  The various methods of treatment have been discussed with the patient and family. After consideration of risks, benefits and other options for treatment, the patient has consented to  Procedure(s) with comments: URETEROSCOPY WITH HOLMIUM LASER LITHOTRIPSY/ STENT PLACEMENT (Left) - ONLY NEEDS 60 MIN as a surgical intervention.  The patient's history has been reviewed, patient examined, no change in status, stable for surgery.  I have reviewed the patient's chart and labs. No fever. Discussed nature r/b/a to various options for ureteral dilation (scope, catheters, balloon) and recurrence risk among others. Also over time the left kidney has made stone and now a stricture - over time the left kidney will decline in function and could need to be removed in the long run. Questions were answered to the patient's satisfaction. Discussed post-op stent.    Jerilee Field

## 2021-08-04 NOTE — Anesthesia Procedure Notes (Signed)
Procedure Name: LMA Insertion Date/Time: 08/04/2021 7:38 AM Performed by: Manvi Guilliams D, CRNA Pre-anesthesia Checklist: Patient identified, Emergency Drugs available, Suction available and Patient being monitored Patient Re-evaluated:Patient Re-evaluated prior to induction Oxygen Delivery Method: Circle system utilized Preoxygenation: Pre-oxygenation with 100% oxygen Induction Type: IV induction Ventilation: Mask ventilation without difficulty LMA: LMA inserted LMA Size: 4.0 Tube type: Oral Number of attempts: 1 Placement Confirmation: positive ETCO2 and breath sounds checked- equal and bilateral Tube secured with: Tape Dental Injury: Teeth and Oropharynx as per pre-operative assessment

## 2021-08-04 NOTE — Op Note (Signed)
Preoperative diagnosis: Left ureteral stone  Postoperative diagnosis: Left ureteral stone  Procedure: Cystoscopy with left ureteroscopy, left retrograde pyelogram, stone basket extraction, left ureteral stent exchange  Surgeon: Mena Goes  Anesthesia: General  Indication for procedure: Thomas Howell is a 30 year old male with a history of stones.  He is in a cycle of passing small stones are getting stuck in a narrowed area of his left proximal ureter.  He passed a small stone last month that lodged in the left proximal ureter and it cannot be accessed ureteroscopically and a stent was placed.  He is brought today for definitive management.  Findings: Urethra and bladder unremarkable.  Left stent in place.  No stone in bladder.  Left ureteroscopy-small stone was located in narrowed area of left ureter ensnared in the basket and removed.  Semirigid scope did pass through this area without difficulty, but there is a narrowing in the ureter for short 5 to 10 mm segment at around L4.  The inner cannula of the access sheath traversed this area without difficulty but the complete access sheath would not pass.  The single channel semirigid scope as well as the dual channel digital scope did pass into the kidney without difficulty, therefore the ureter was not balloon dilated.  Minimal stone burden of the left kidney.  Left retrograde pyelogram-this outlined a narrowing in the left proximal ureter at about L4 and then proximal hydroureteronephrosis.  Description of procedure: After consent was obtained patient brought to the operating room.  After adequate anesthesia he was placed lithotomy position and prepped and draped in the usual sterile fashion.  Timeout was performed from the patient and procedure.  Cystoscope was passed per urethra and the stent was visualized.  A sensor wire was advanced adjacent to the stent and then the stent was grasped and removed intact without difficulty.  Stent had begun to encrust.   I then passed a single channel semirigid ureteroscope into the proximal ureter where the narrowing was located in the small stone.  Initially I was going to laser but it did appear small enough to grasp with a 0 tip basket and was removed intact.  I then repassed the semirigid scope and was able to reach the left UPJ.  I passed a second wire, zip wire, under direct vision.  The scope was backed out.  I then passed the inner cannula of the access sheath which passed up to the UPJ without difficulty.  The complete access sheath was then passed leaving the zip wires the safety wire but it would not traverse proximal to L4 and then narrowed area.  The dual channel digital ureteroscope was advanced and it was able to pass through the narrowing in the left ureter up into the collecting system where it was inspected.  It was a bit difficult to navigate because the long access sheath was only about two thirds up which limited scope movement into flexion.  I carefully inspected the calyces and reviewed his CT scan images again in the OR and found no significant stone burden in the left collecting system.  A small stone was located in the lower pole calyx and it was ensnared and removed.  Digital ureteroscope was then readvanced the collecting system was inspected.  The access sheath was further backed out on the ureteroscope and the collecting system UPJ and ureter were inspected and noted to be again free of any significant stone, stone fragment or injury.  The area in the left proximal ureter was narrowed but  again easily traversable with the semirigid and the dual channel digital ureteroscope.  Therefore I did not formally dilate this area with the balloon.  The scope was then completely backed out and the Glidewire was backloaded on the cystoscope.  A 6 x 24 cm stent advanced.  The wire was removed with a good coil seen in the left renal pelvis and a good coil in the bladder.  I left a string on the stent.  He is  awakened taken the cover room in stable condition.  Complications: None  Blood loss: Minimal  Specimens: None  Drains: 6 x 24 cm left ureteral stent with string  Disposition: patient stable to PACU

## 2021-08-04 NOTE — Anesthesia Postprocedure Evaluation (Signed)
Anesthesia Post Note  Patient: Thomas Howell  Procedure(s) Performed: URETEROSCOPY WITH basket removal of stones/ STENT exchange/retrograde (Left: Ureter)     Patient location during evaluation: PACU Anesthesia Type: General Level of consciousness: awake and alert Pain management: pain level controlled Vital Signs Assessment: post-procedure vital signs reviewed and stable Respiratory status: spontaneous breathing, nonlabored ventilation, respiratory function stable and patient connected to nasal cannula oxygen Cardiovascular status: blood pressure returned to baseline and stable Postop Assessment: no apparent nausea or vomiting Anesthetic complications: no   No notable events documented.  Last Vitals:  Vitals:   08/04/21 1015 08/04/21 1030  BP:  126/89  Pulse: 85 94  Resp: 18 12  Temp: 36.7 C 36.7 C  SpO2: 94% 96%    Last Pain:  Vitals:   08/04/21 1015  TempSrc:   PainSc: 7                  Shelton Silvas

## 2021-08-05 ENCOUNTER — Encounter (HOSPITAL_BASED_OUTPATIENT_CLINIC_OR_DEPARTMENT_OTHER): Payer: Self-pay | Admitting: Urology

## 2021-09-17 IMAGING — CT CT RENAL STONE PROTOCOL
2 of 4 series · 16 of 46 positions shown, 18 images · non-contrast
Comparison: March 29, 2020.

CLINICAL DATA: Left flank pain

EXAM:
CT ABDOMEN AND PELVIS WITHOUT CONTRAST
TECHNIQUE: Multidetector CT imaging of the abdomen and pelvis was performed
following the standard protocol without oral or IV contrast.

[Series 2: axial st · axial · 0.79mm/px · z∈[-490,-70]mm · 13 of 98 slices shown, 15 images]
[im 7/98  soft-tissue]
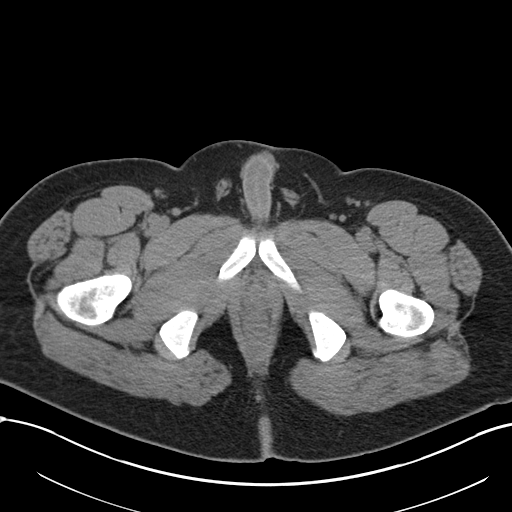
[im 7/98  bone]
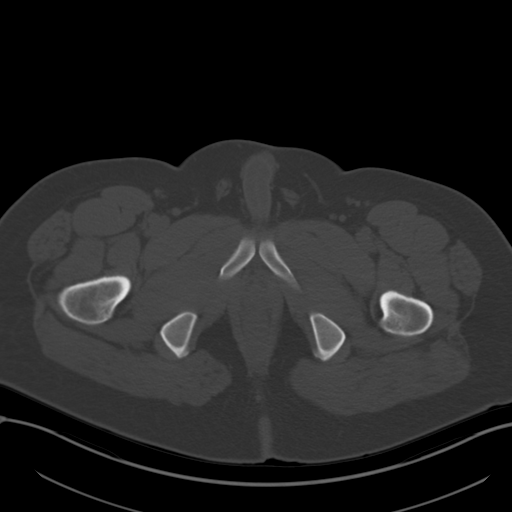
[im 13/98  soft-tissue]
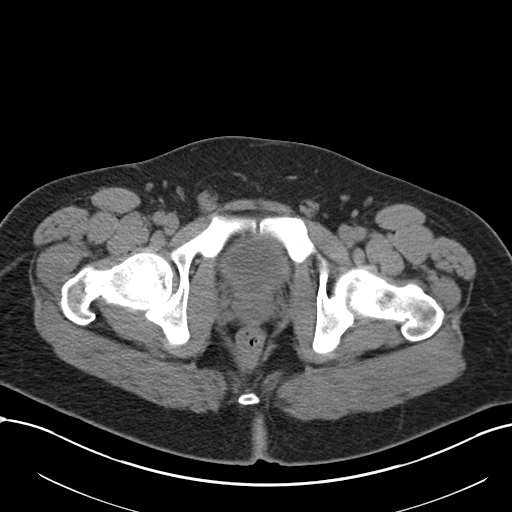
[im 19/98  soft-tissue]
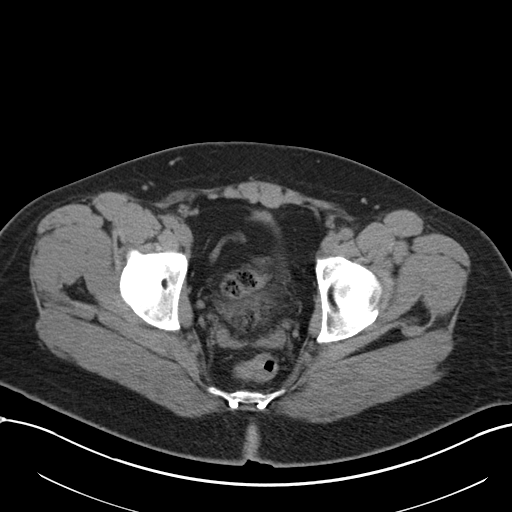
[im 31/98  soft-tissue]
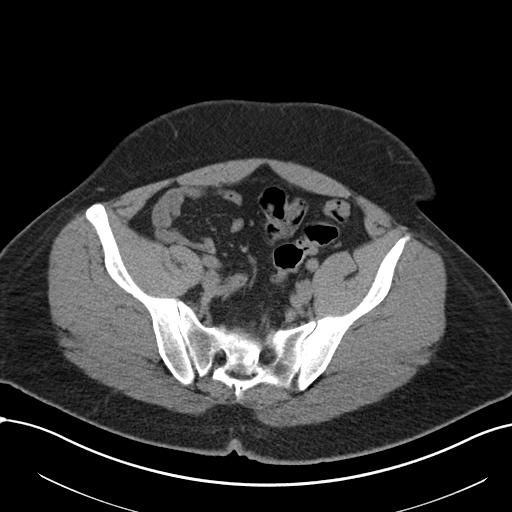
[im 37/98  soft-tissue]
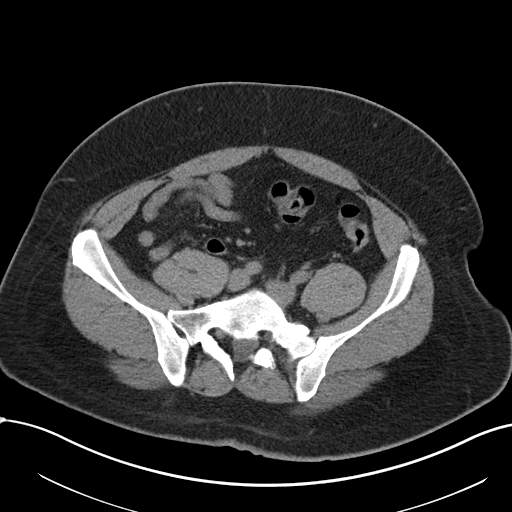
[im 43/98  soft-tissue]
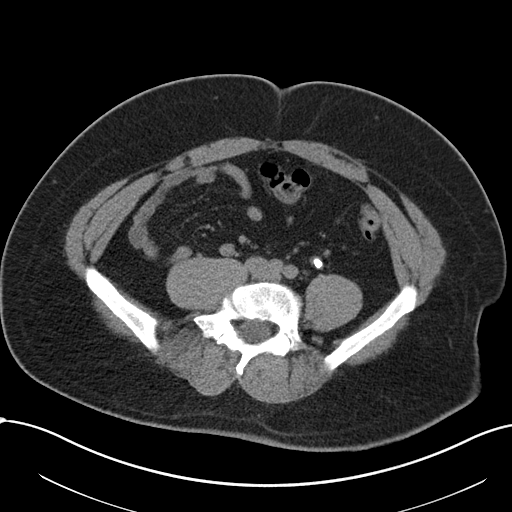
[im 49/98  soft-tissue]
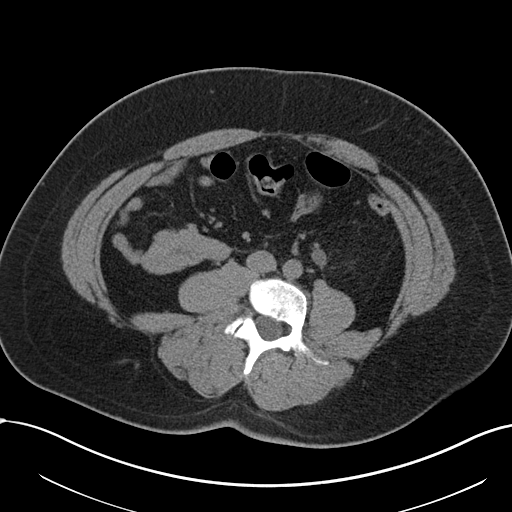
[im 55/98  soft-tissue]
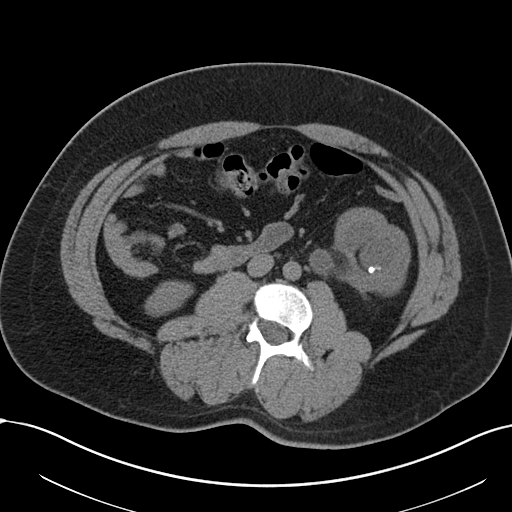
[im 61/98  soft-tissue]
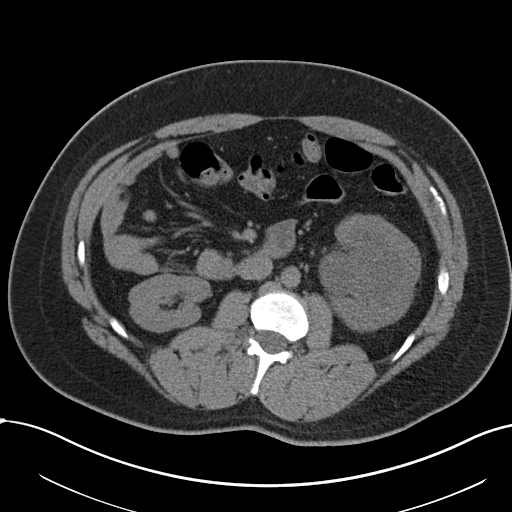
[im 61/98  bone]
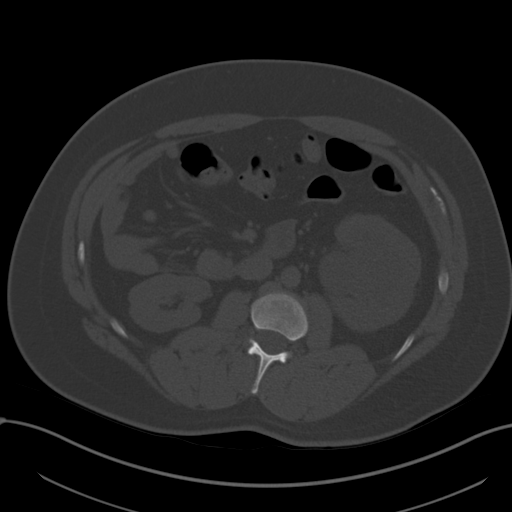
[im 67/98  soft-tissue]
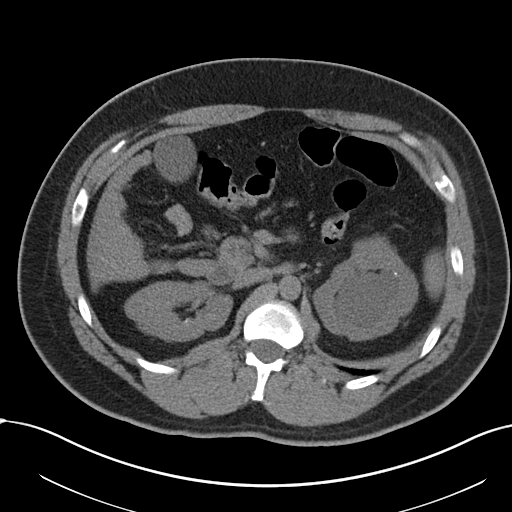
[im 79/98  soft-tissue]
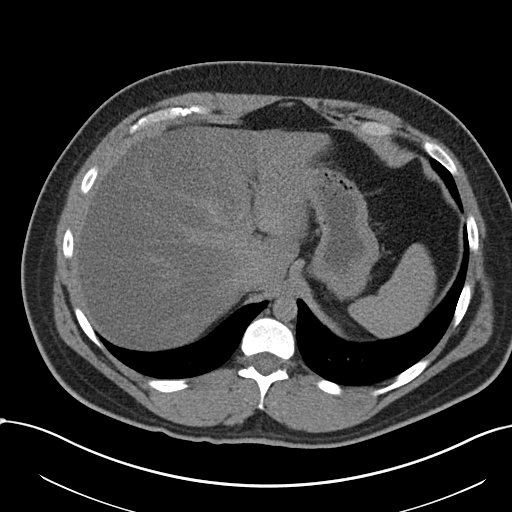
[im 85/98  soft-tissue]
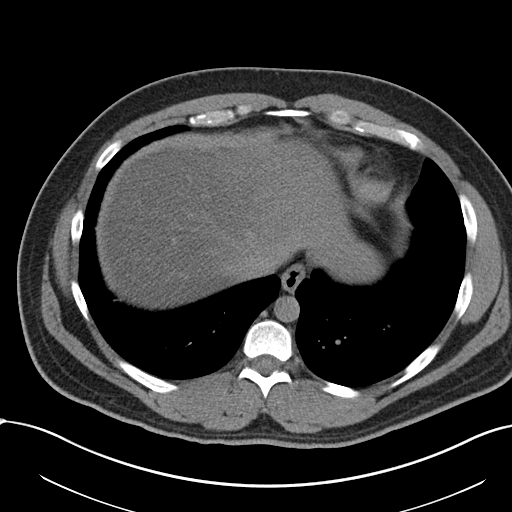
[im 91/98  soft-tissue]
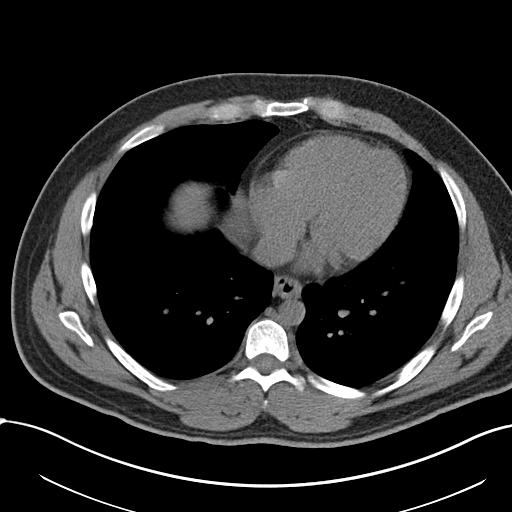

[Series 4: coronal · coronal · 0.86mm/px · 3 of 159 slices shown]
[im 53/159  soft-tissue]
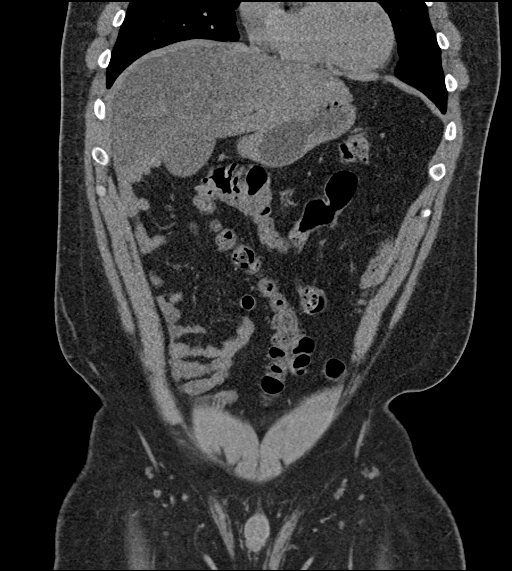
[im 71/159  soft-tissue]
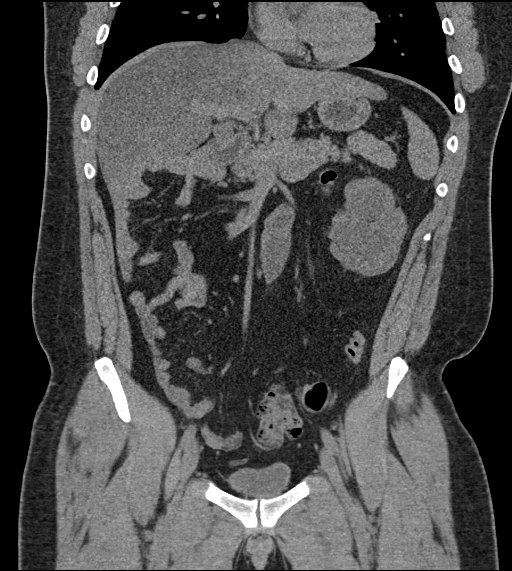
[im 88/159  soft-tissue]
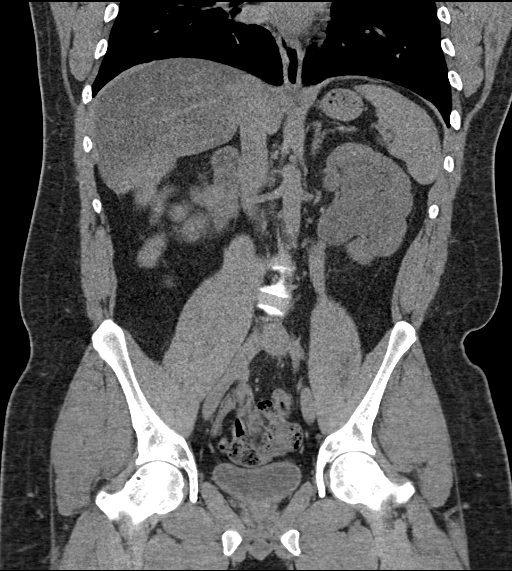

[16 of 46 positions shown; findings below may reference images not displayed]

FINDINGS: Lower chest: Lung bases are clear.

Hepatobiliary: There is diffuse decreased attenuation throughout
much of the liver consistent with hepatic steatosis. Areas of
relative fatty sparing noted inferiorly in the right lobe. No focal
liver lesions evident on this noncontrast enhanced study.
Gallbladder wall is not appreciably thickened. There is no biliary
duct dilatation.

Pancreas: There is no pancreatic mass or inflammatory focus.

Spleen: No splenic lesions are evident.

Adrenals/Urinary Tract: Adrenals bilaterally appear normal. There is
no appreciable renal mass on either side. There is severe
hydronephrosis on the left. There is no hydronephrosis on the right.
On the right, there are multiple 1-3 mm calculi in the upper and
lower pole regions. On the left, there are scattered 1 mm calculi in
the upper pole region. There is a 4 x 3 mm calculus with a 5 x 4 mm
calculus in the lower pole left kidney region. There is a calculus
in the proximal left ureter at the L4 level measuring 1.3 x 0.7 cm.
No other ureteral calculi are evident. Urinary bladder is midline
with wall thickness within normal limits.

Stomach/Bowel: Note degree of bowel malrotation, stable from
previous study. Specifically, cecum is in the midline pelvis with
nearly all of colon to the left of midline. There is no appreciable
bowel wall or mesenteric thickening. There is no evident bowel
obstruction. Terminal ileum appears unremarkable. Appendix appears
normal. There is no appreciable free air or portal venous air.

Vascular/Lymphatic: No abdominal aortic aneurysm. No vascular
lesions evident on noncontrast enhanced study. No adenopathy evident
in the abdomen or pelvis.

Reproductive: Prostate and seminal vesicles are normal in size and
contour. There is minimal prostatic calcification.

Other: No abscess or ascites evident in the abdomen or pelvis. There
is slight fat in the umbilicus.

Musculoskeletal: No blastic or lytic bone lesions. There is lumbar
levoscoliosis. No intramuscular lesions.
IMPRESSION: 1. There is a calculus measuring 1.3 x 0.7 cm in the left ureter at
the level of L4 with marked hydronephrosis on the left.

2. Multiple calculi in each kidney, nonobstructing, larger and more
numerous on the left than on the right.

3. Note that the cecum is in the midline pelvis. No bowel
obstruction. No abscess in the abdomen or pelvis. Appendix appears
normal.

4.  Hepatic steatosis.

## 2021-09-17 IMAGING — US US RENAL
1 series · 14 of 25 positions shown · non-contrast
Comparison: 09/27/2020

CLINICAL DATA: Left-sided flank pain

EXAM:
RENAL / URINARY TRACT ULTRASOUND COMPLETE

[Series 1: us renal · 14 of 51 slices shown]
[im 1/51]
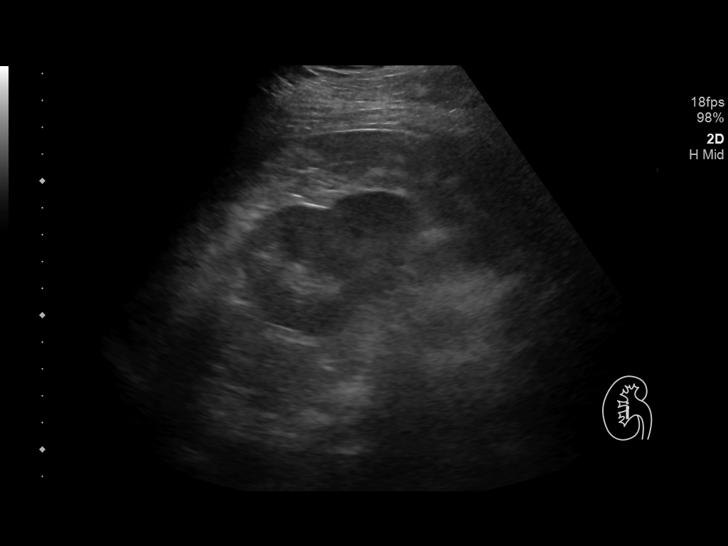
[im 5/51]
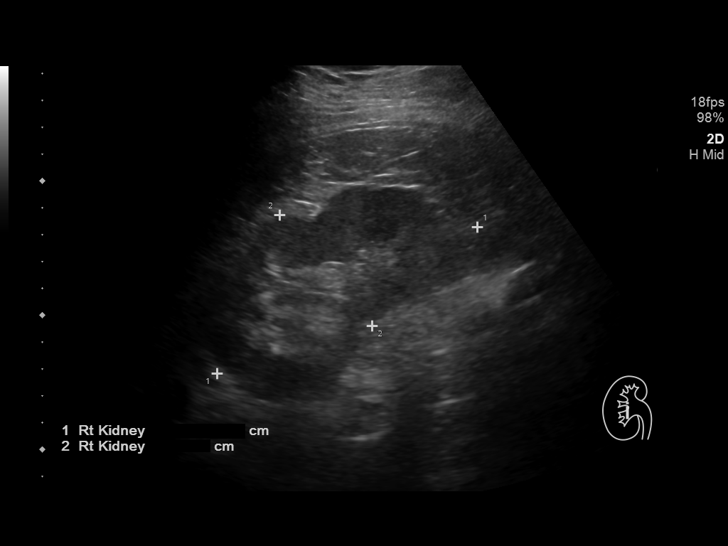
[im 9/51]
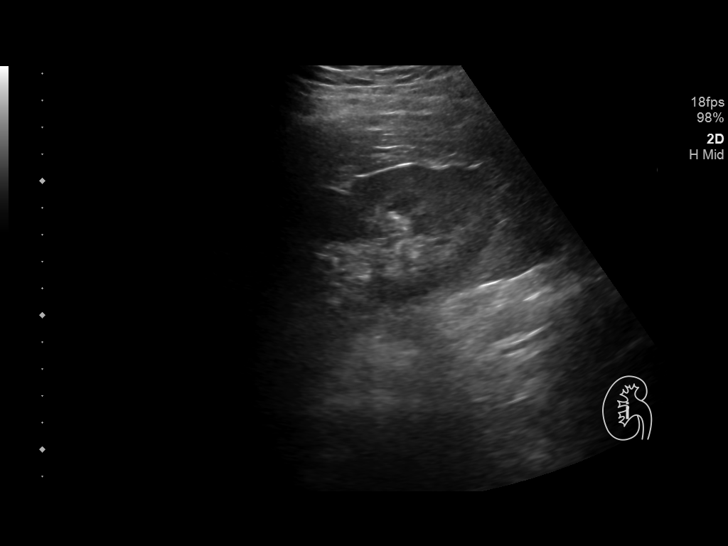
[im 13/51]
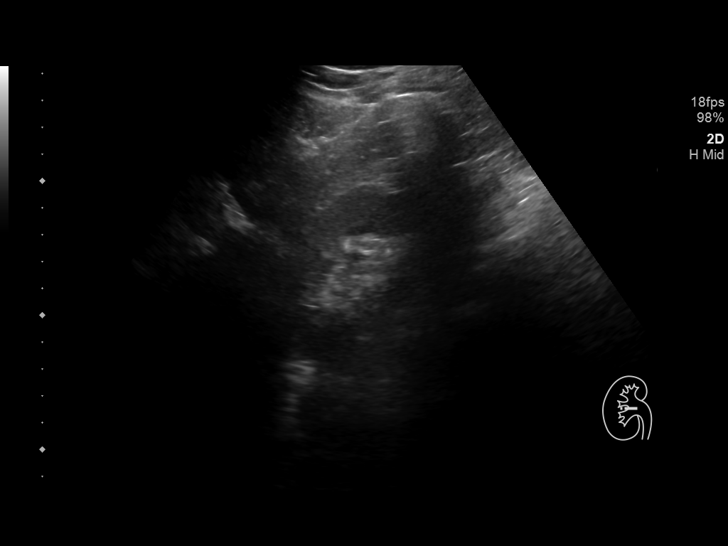
[im 17/51]
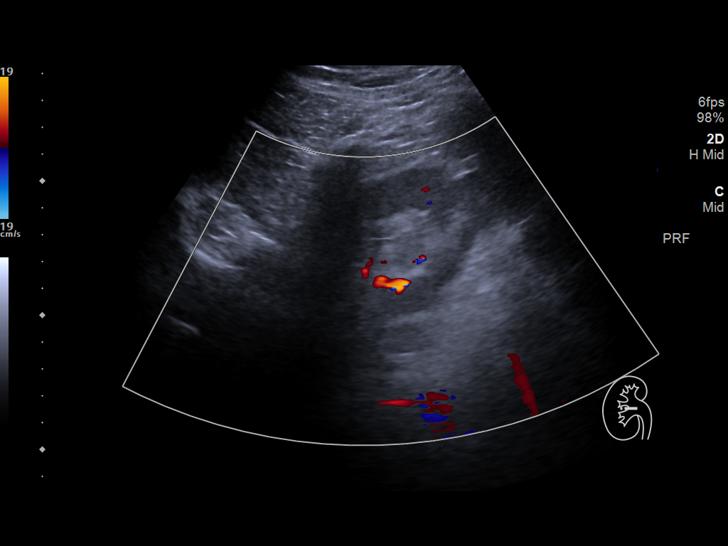
[im 19/51]
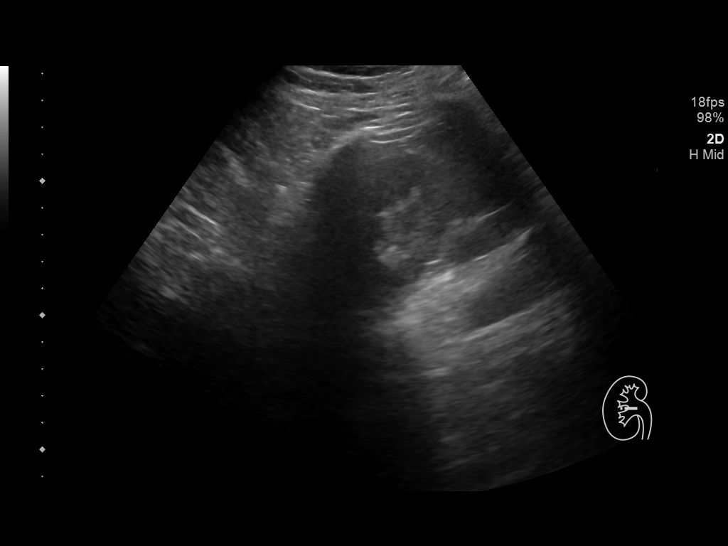
[im 23/51]
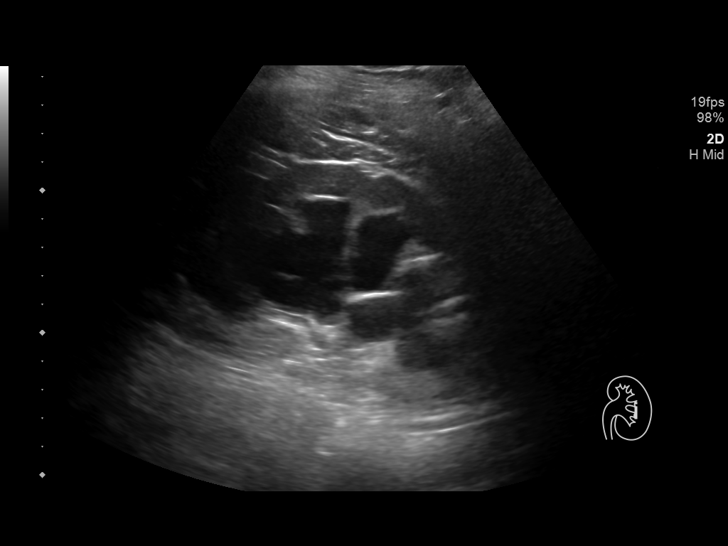
[im 28/51]
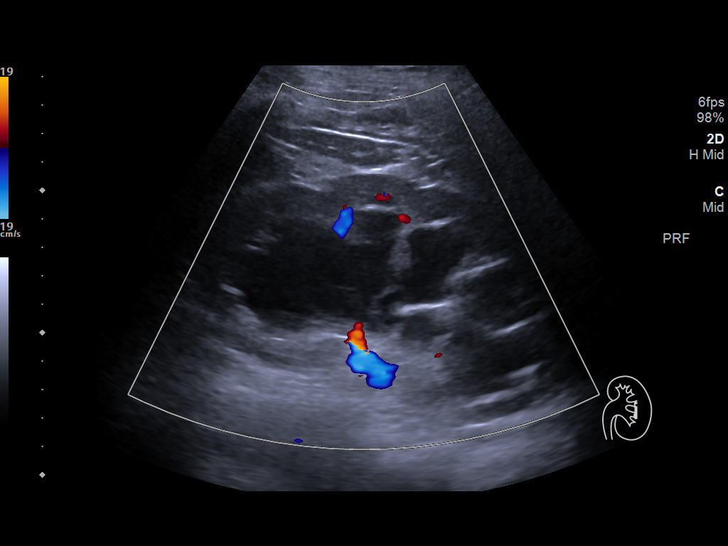
[im 32/51]
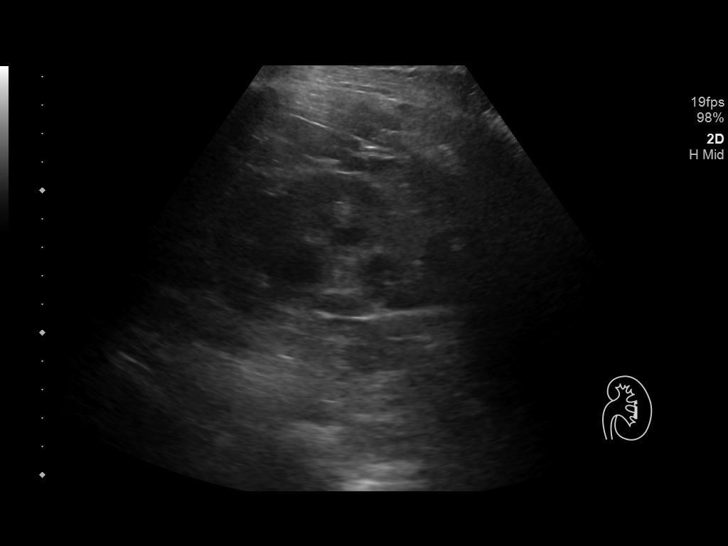
[im 34/51]
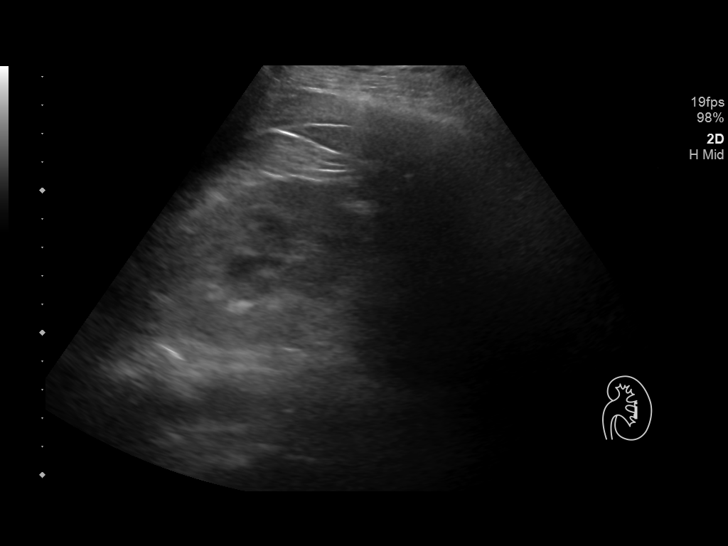
[im 38/51]
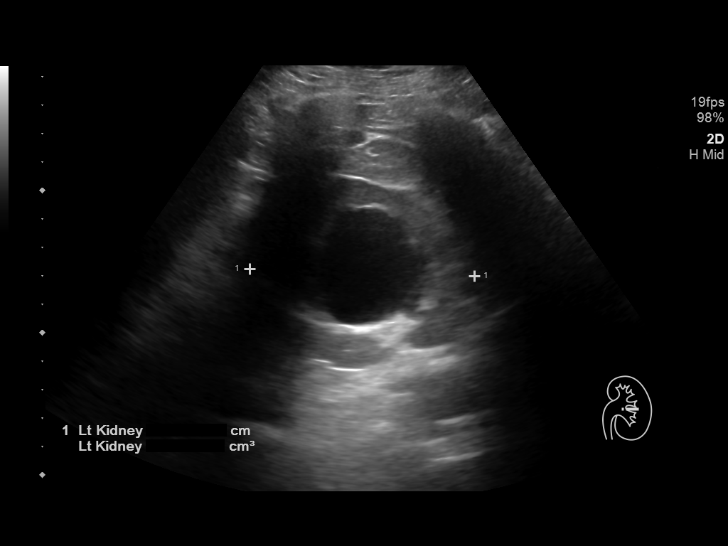
[im 42/51]
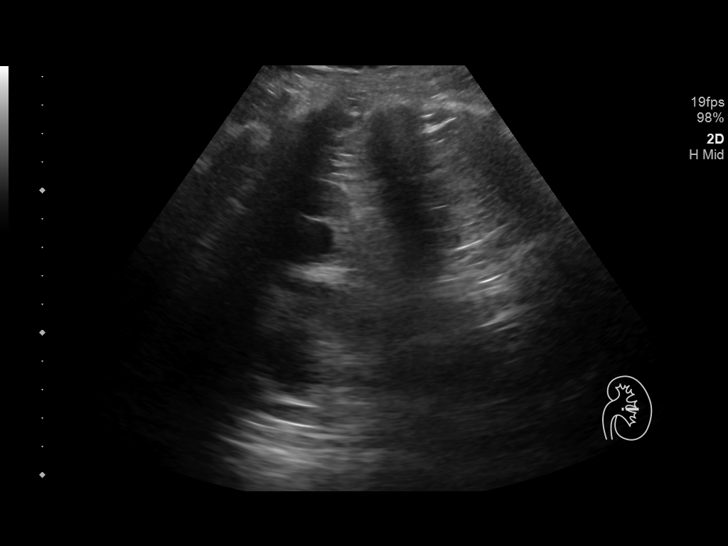
[im 46/51]
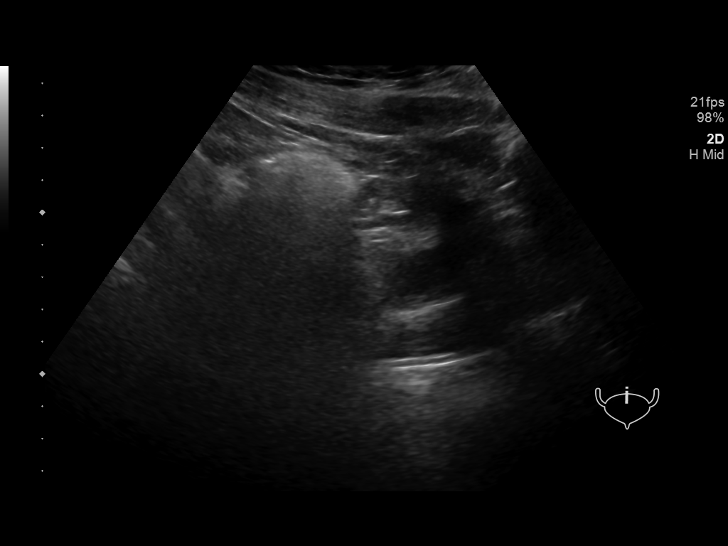
[im 51/51]
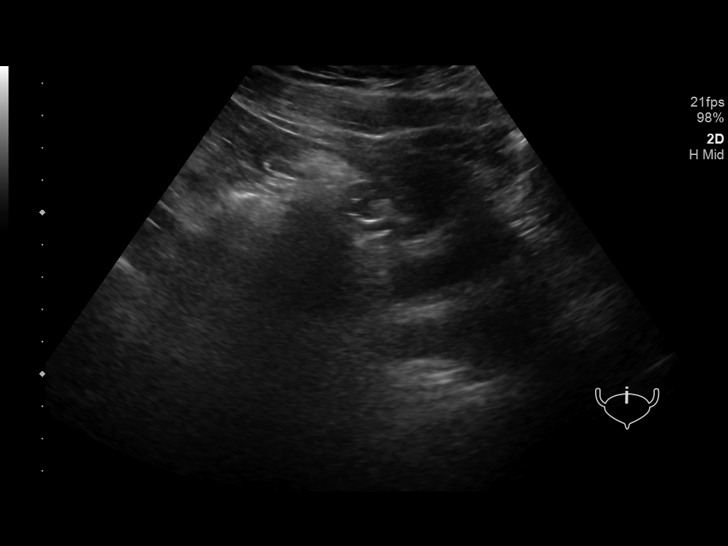

[14 of 25 positions shown; findings below may reference images not displayed]

FINDINGS: Right Kidney:

Renal measurements: 11.1 x 5.4 x 6.9 cm. = volume: 217 mL. No mass
lesion or hydronephrosis is noted. Previously seen calculi are not
well appreciated.

Left Kidney:

Renal measurements: 14.0 x 6.8 x 7.9 cm. = volume: 396 mL. Severe
hydronephrosis is identified on the left increased from the prior
exam. Previously seen calculi are not well appreciated.

Bladder:

Appears normal for degree of bladder distention.

Other:

None.
IMPRESSION: Increase in left-sided hydronephrosis. CT urogram may be helpful for
further evaluation. Previously seen calculi bilaterally are not well
appreciated on this exam.

## 2021-10-10 IMAGING — US US RENAL
1 series · 13 of 25 positions shown · non-contrast
Comparison: CT Abdomen and Pelvis and ultrasound 03/11/2021

CLINICAL DATA: 29-year-old male with left flank pain. History of
bilateral nephrolithiasis, left obstructive uropathy in [REDACTED].

EXAM:
RENAL / URINARY TRACT ULTRASOUND COMPLETE

[Series 1: us renal · 13 of 40 slices shown]
[im 1/40]
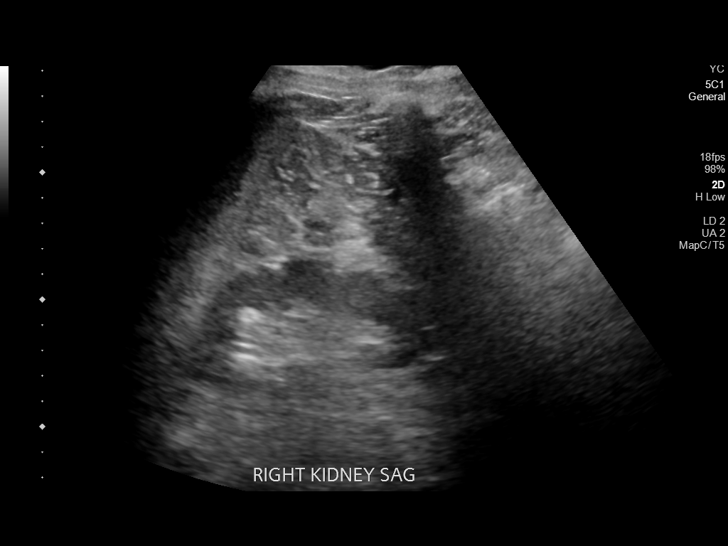
[im 4/40]
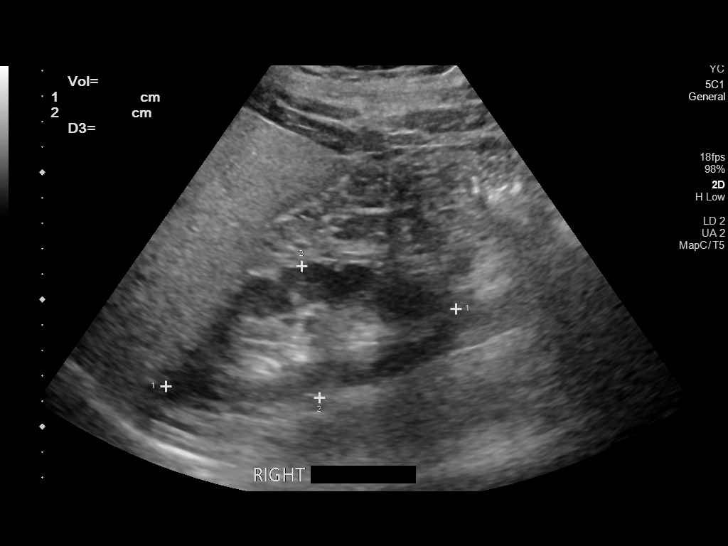
[im 7/40]
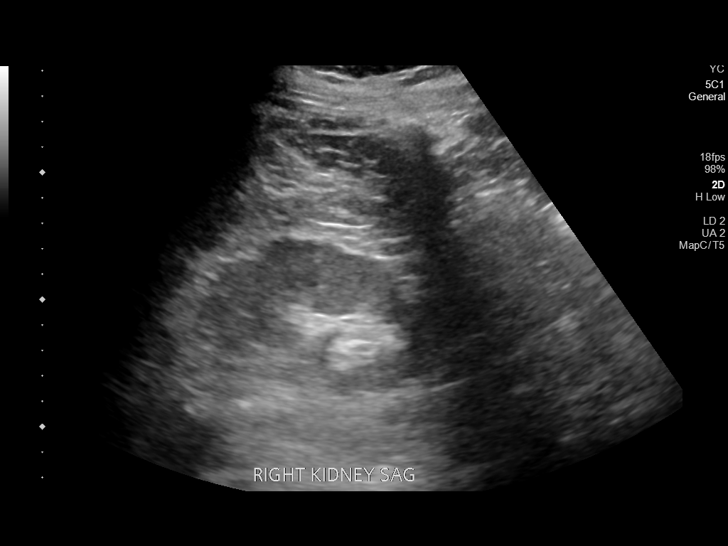
[im 10/40]
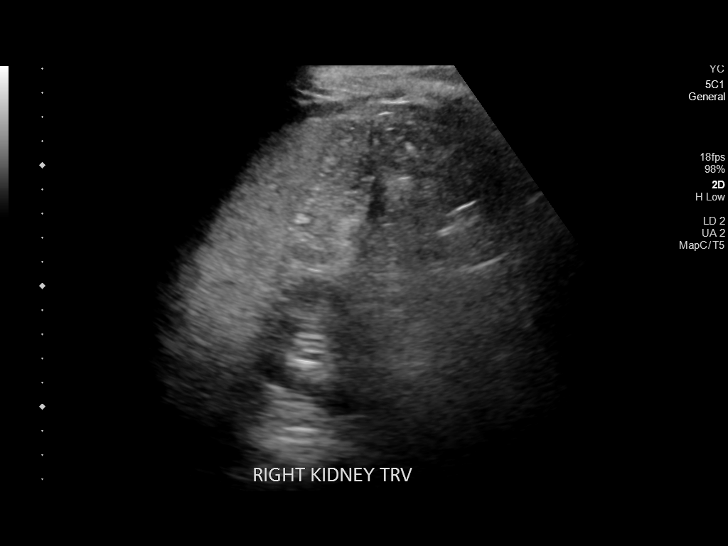
[im 14/40]
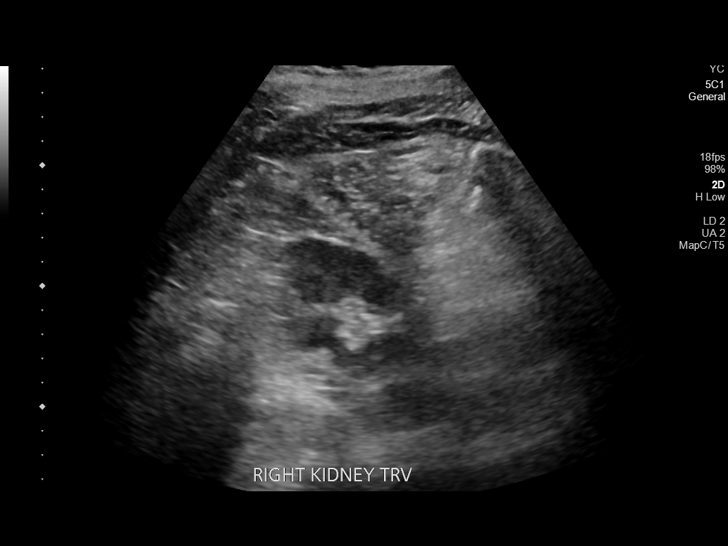
[im 17/40]
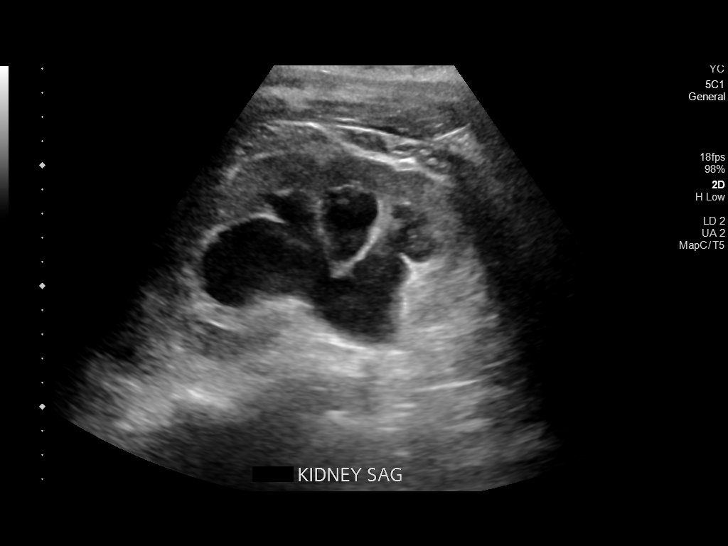
[im 20/40]
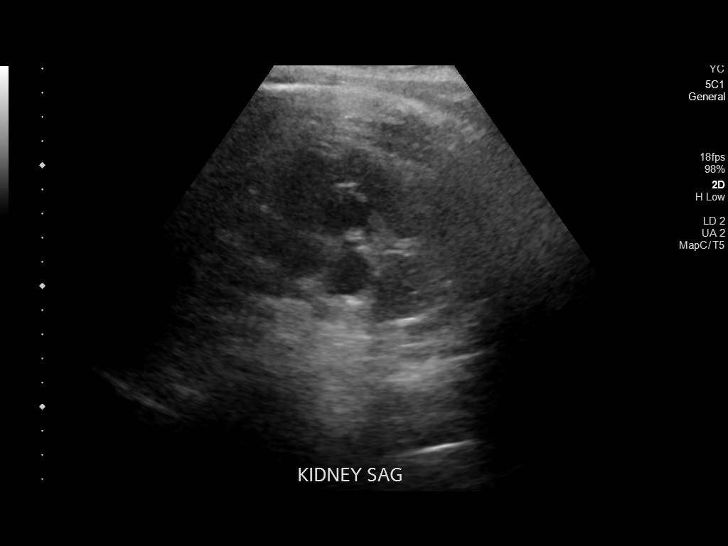
[im 23/40]
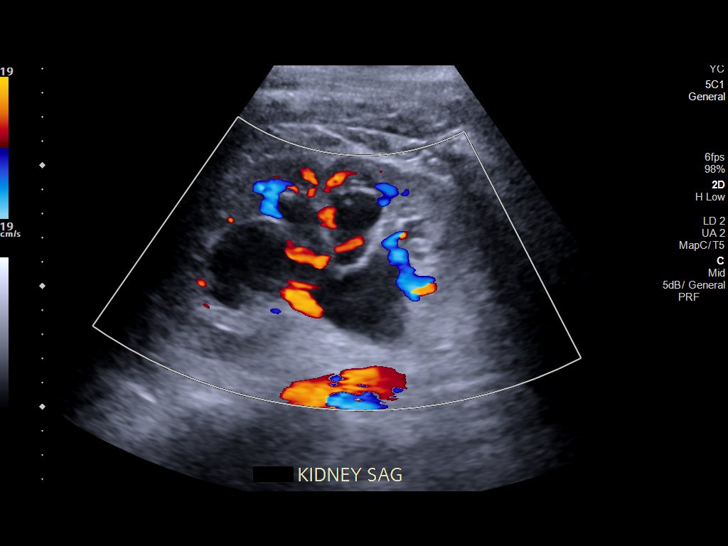
[im 27/40]
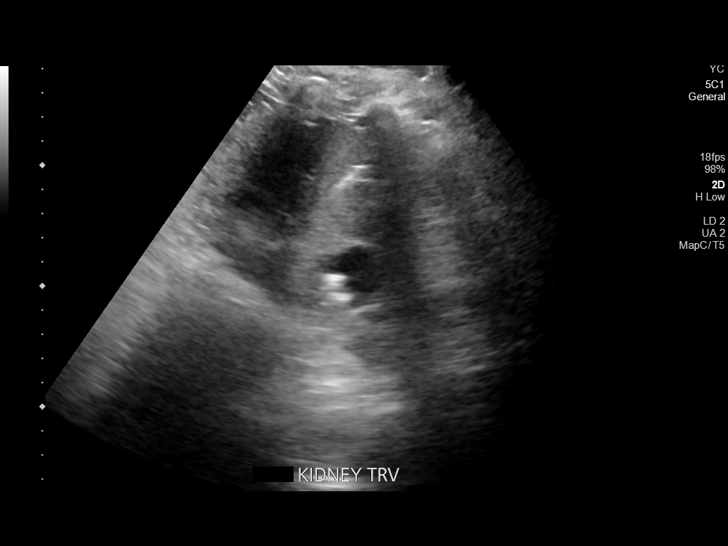
[im 30/40]
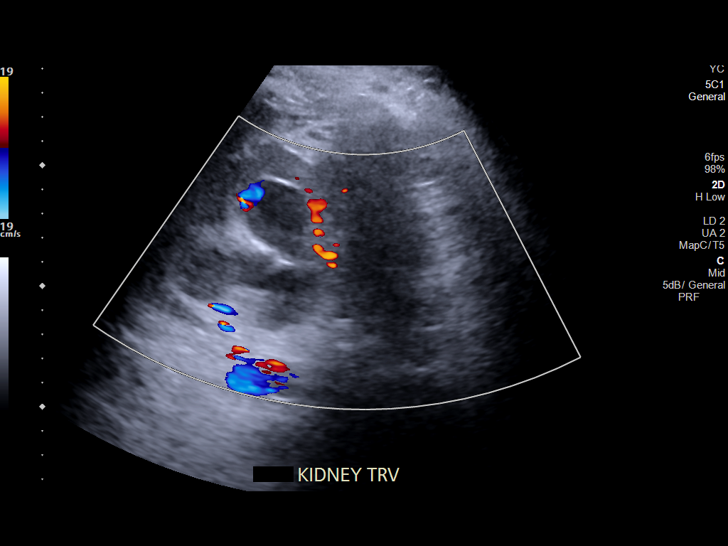
[im 33/40]
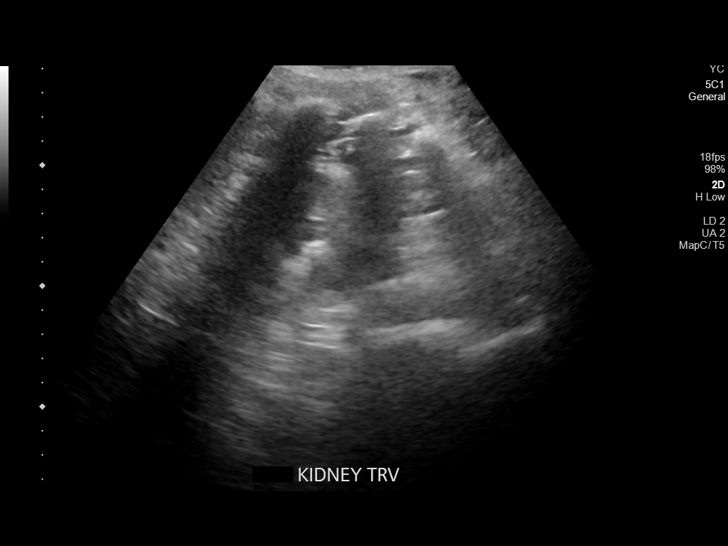
[im 36/40]
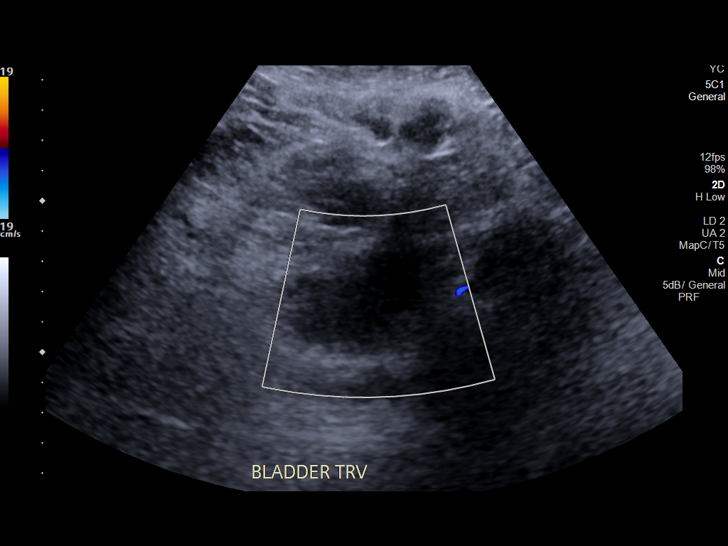
[im 40/40]
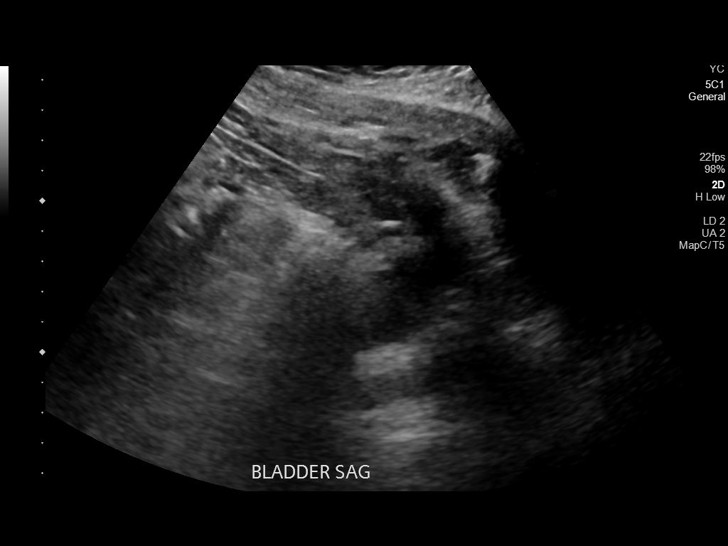

[13 of 25 positions shown; findings below may reference images not displayed]

FINDINGS: Right Kidney:

Renal measurements: 11.8 x 5.2 x 5.8 cm = volume: 186 mL.
Echogenicity within normal limits. No mass or hydronephrosis
visualized. The mostly punctate nephrolithiasis by CT last month is
not evident by ultrasound.

Left Kidney:

Renal measurements: 13.9 x 7.3 x 9.4 cm = volume: 496 mL. Ongoing
moderate to severe left hydronephrosis not significantly changed
from last month (image 19). Stable left renal cortical echogenicity,
with some cortical thinning noted when compared to the right kidney.
The fairly extensive left nephrolithiasis demonstrated last month is
not evident by ultrasound.

Bladder:

Decompressed, unremarkable.

Other:

Echogenic liver, hepatic steatosis redemonstrated (image 12).
IMPRESSION: 1. Moderate to severe left hydronephrosis without improvement from
last month when bulky obstructing mid-ureteral stone(s) demonstrated
by CT.
2. Some left renal cortical atrophy suspected as the sequelae of
obstructive uropathy.
3. Known bilateral nephrolithiasis not evident by ultrasound.
4. Normal ultrasound appearance of the right kidney.

## 2021-10-15 DIAGNOSIS — R6883 Chills (without fever): Secondary | ICD-10-CM | POA: Diagnosis not present

## 2021-10-15 DIAGNOSIS — N1330 Unspecified hydronephrosis: Secondary | ICD-10-CM | POA: Diagnosis not present

## 2021-10-15 DIAGNOSIS — R300 Dysuria: Secondary | ICD-10-CM | POA: Diagnosis not present

## 2021-10-15 DIAGNOSIS — R109 Unspecified abdominal pain: Secondary | ICD-10-CM | POA: Diagnosis not present

## 2021-10-15 DIAGNOSIS — N200 Calculus of kidney: Secondary | ICD-10-CM | POA: Diagnosis not present

## 2021-10-15 DIAGNOSIS — Z79899 Other long term (current) drug therapy: Secondary | ICD-10-CM | POA: Diagnosis not present

## 2021-10-15 DIAGNOSIS — I10 Essential (primary) hypertension: Secondary | ICD-10-CM | POA: Diagnosis not present

## 2021-10-15 DIAGNOSIS — N132 Hydronephrosis with renal and ureteral calculous obstruction: Secondary | ICD-10-CM | POA: Diagnosis not present

## 2021-10-15 DIAGNOSIS — Z87442 Personal history of urinary calculi: Secondary | ICD-10-CM | POA: Diagnosis not present

## 2021-10-26 IMAGING — CT CT RENAL STONE PROTOCOL
2 of 4 series · 16 of 46 positions shown, 18 images · non-contrast
Comparison: 03/11/2021

CLINICAL DATA: 29-year-old male with LEFT flank and abdominal pain.
History of urinary calculi.

EXAM:
CT ABDOMEN AND PELVIS WITHOUT CONTRAST
TECHNIQUE: Multidetector CT imaging of the abdomen and pelvis was performed
following the standard protocol without IV contrast.

[Series 2: axial st · axial · 0.92mm/px · z∈[-475,+25]mm · 13 of 116 slices shown, 15 images]
[im 8/116  soft-tissue]
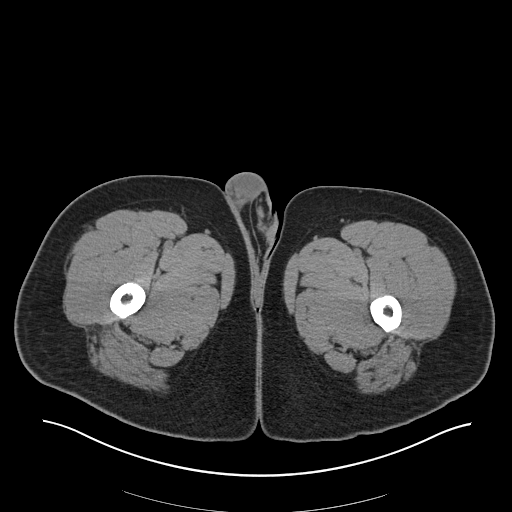
[im 8/116  bone]
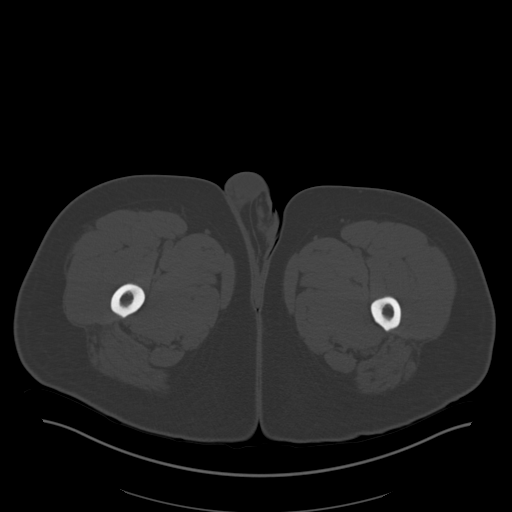
[im 15/116  soft-tissue]
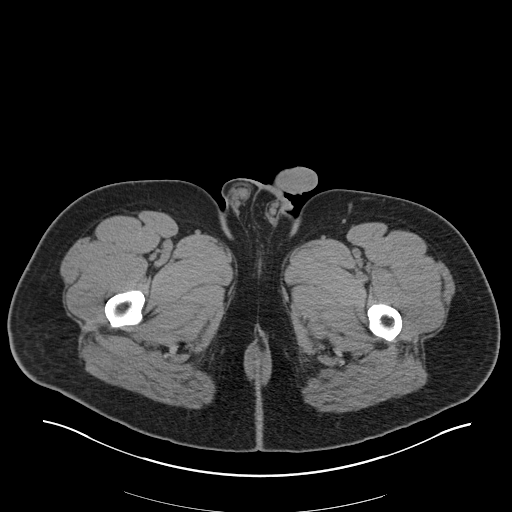
[im 22/116  soft-tissue]
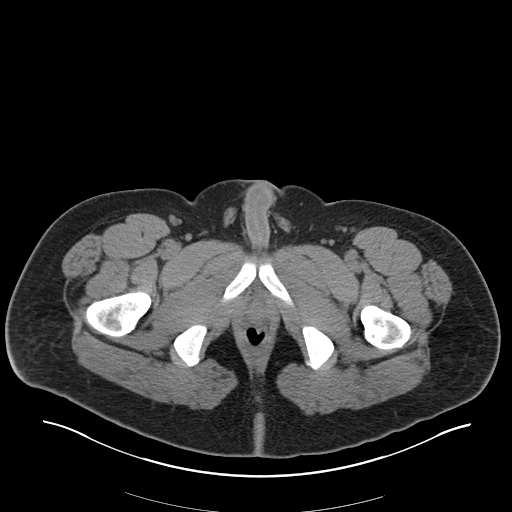
[im 36/116  soft-tissue]
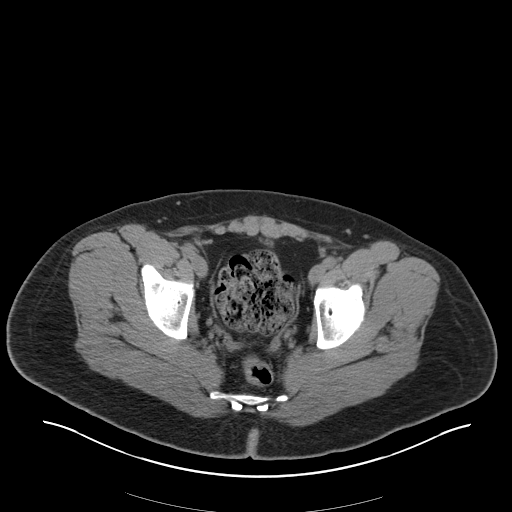
[im 44/116  soft-tissue]
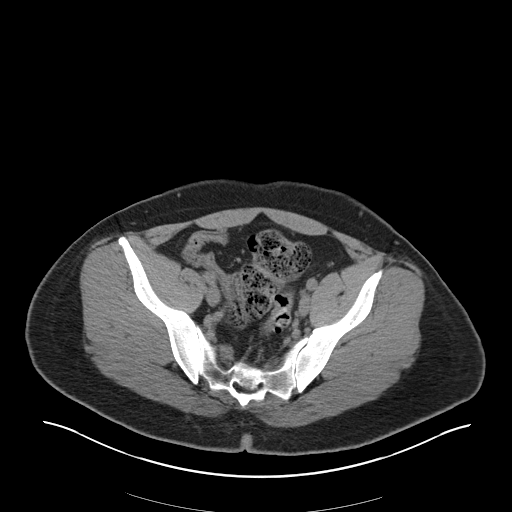
[im 51/116  soft-tissue]
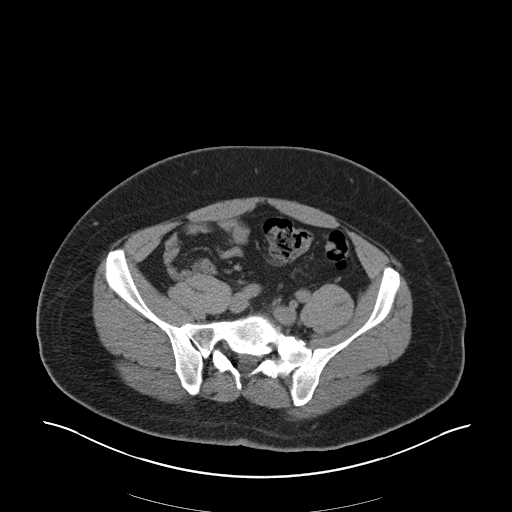
[im 58/116  soft-tissue]
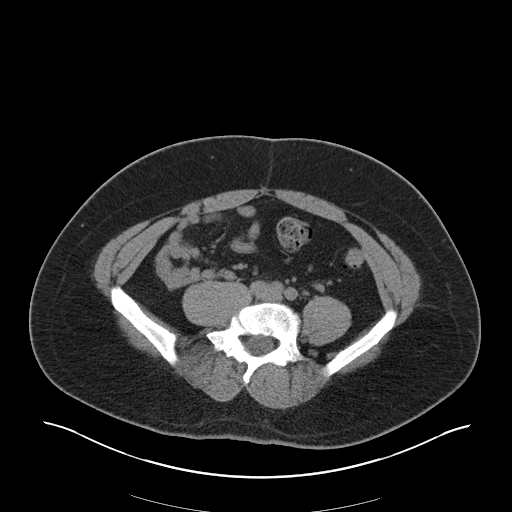
[im 65/116  soft-tissue]
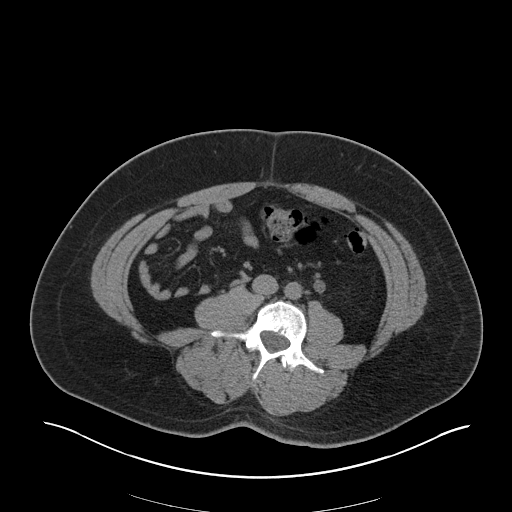
[im 72/116  soft-tissue]
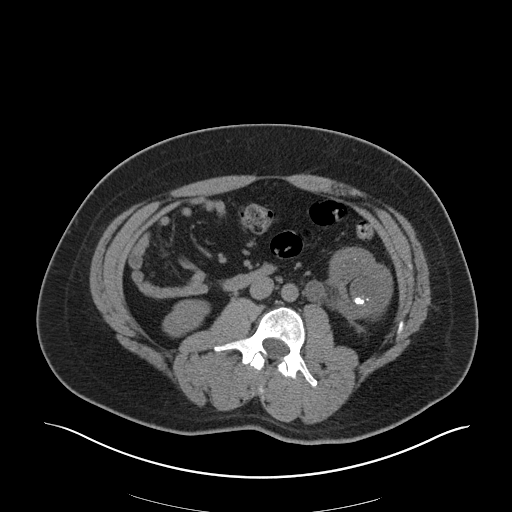
[im 72/116  bone]
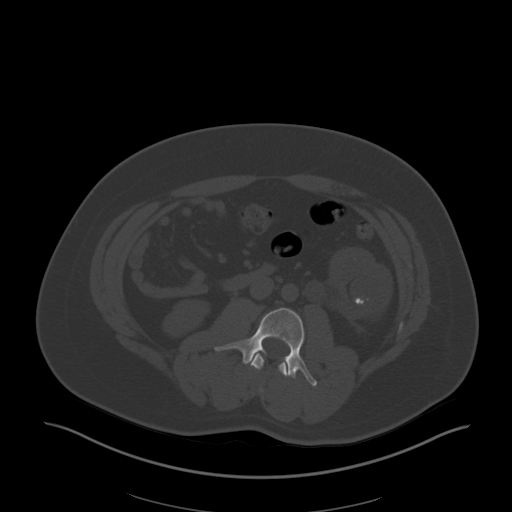
[im 80/116  soft-tissue]
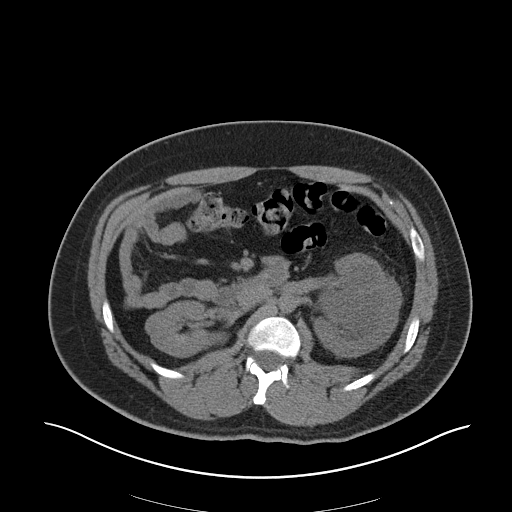
[im 94/116  soft-tissue]
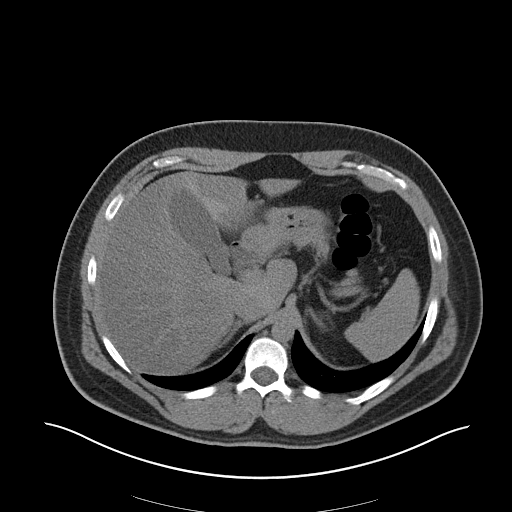
[im 101/116  soft-tissue]
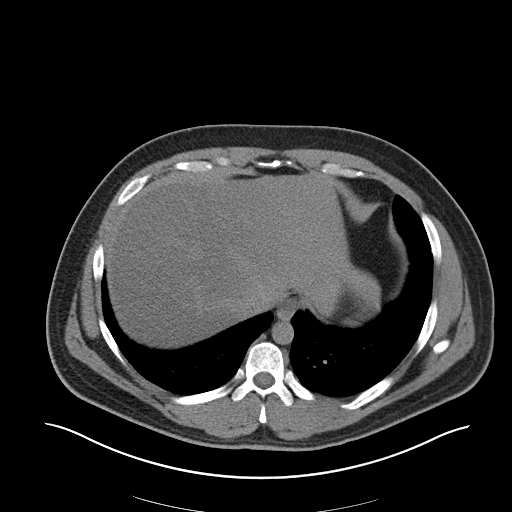
[im 108/116  soft-tissue]
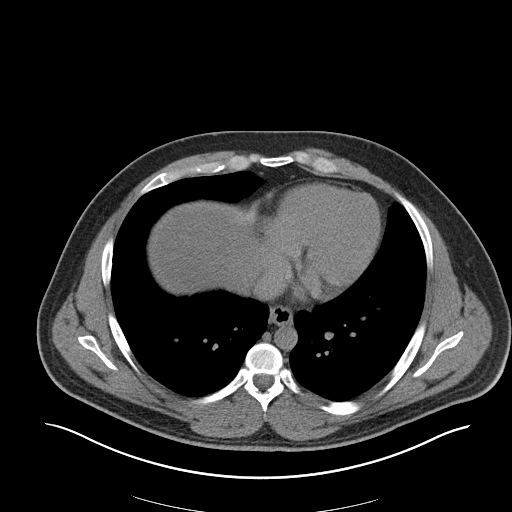

[Series 5: coronal · coronal · 0.97mm/px · 3 of 161 slices shown]
[im 54/161  soft-tissue]
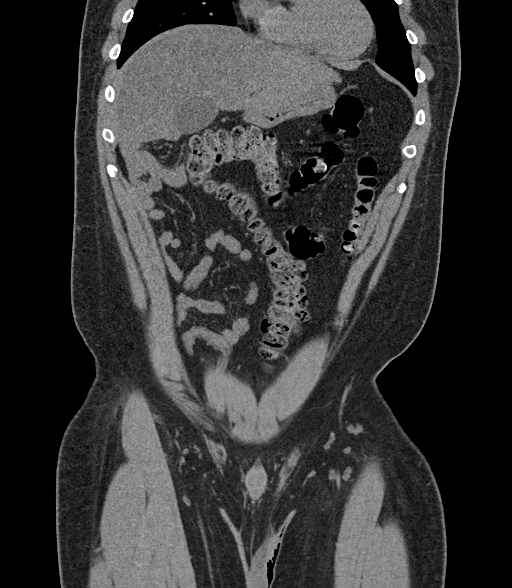
[im 72/161  soft-tissue]
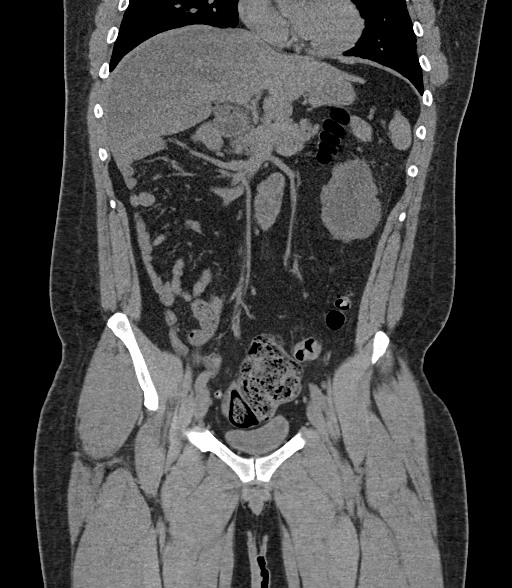
[im 89/161  soft-tissue]
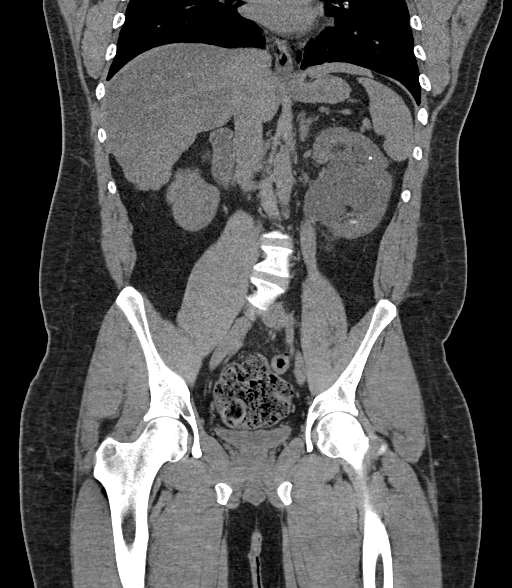

[16 of 46 positions shown; findings below may reference images not displayed]

FINDINGS: Please note that parenchymal abnormalities may be missed without
intravenous contrast.

Lower chest: No acute abnormality.

Hepatobiliary: Hepatic steatosis again noted without focal hepatic
lesion. The gallbladder is unremarkable. No biliary dilatation.

Pancreas: Unremarkable

Spleen: Unremarkable

Adrenals/Urinary Tract: Stable to slightly decreased severe LEFT
hydroureteronephrosis (since 03/11/2021) extends to a 3 mm residual
mid LEFT ureteral calculus - previously the stone burden measured up
to 13 mm. The LEFT ureter distal to this calculus is not distended.

Multiple nonobstructing bilateral renal calculi are again noted.

No new ureteral calculi are identified.

Stomach/Bowel: Stomach is within normal limits. Appendix appears
normal. No evidence of bowel wall thickening, distention, or
inflammatory changes.

Vascular/Lymphatic: No significant vascular findings are present. No
enlarged abdominal or pelvic lymph nodes.

Reproductive: Prostate is unremarkable.

Other: No ascites, pneumoperitoneum or focal collection.

Musculoskeletal: No acute or suspicious bony abnormalities are
identified. Lumbar scoliosis again noted.
IMPRESSION: 1. 3 mm residual mid LEFT ureteral calculus (previously the stone
burden at this site measures 13 mm on 03/11/2021) with stable to
slightly decreased severe LEFT hydroureteronephrosis.
2. Multiple nonobstructing bilateral renal calculi.
3. Hepatic steatosis.

## 2021-10-26 IMAGING — US US RENAL
1 series · 14 of 24 positions shown · non-contrast
Comparison: April 03, 2021 ultrasound

CLINICAL DATA: Left flank pain.

EXAM:
RENAL / URINARY TRACT ULTRASOUND COMPLETE

[Series 1: us renal · 14 of 24 slices shown]
[im 1/24]
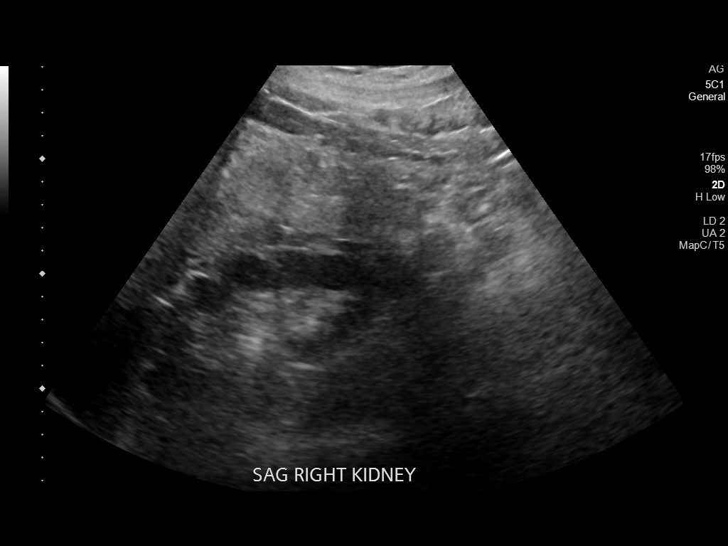
[im 3/24]
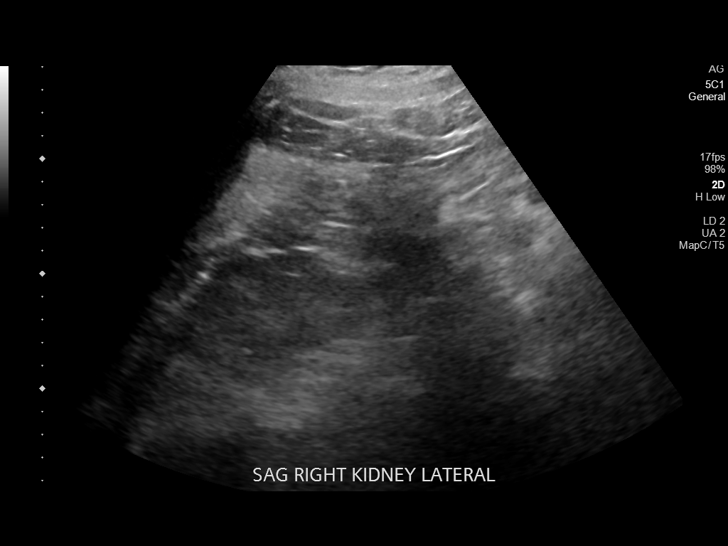
[im 5/24]
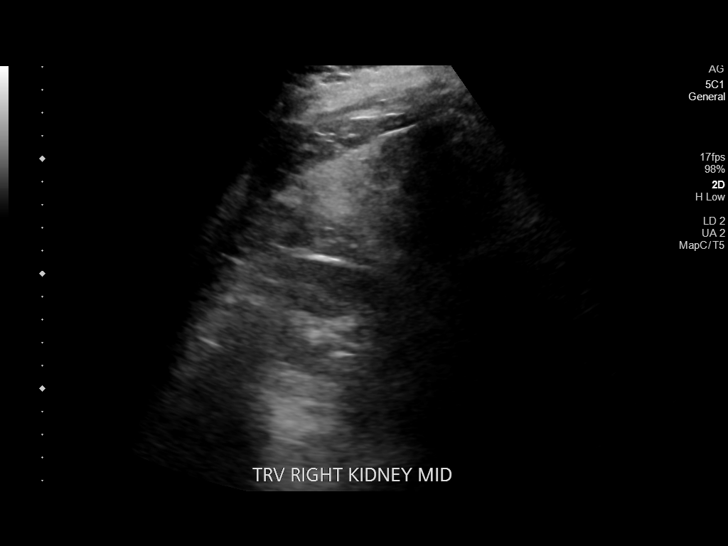
[im 7/24]
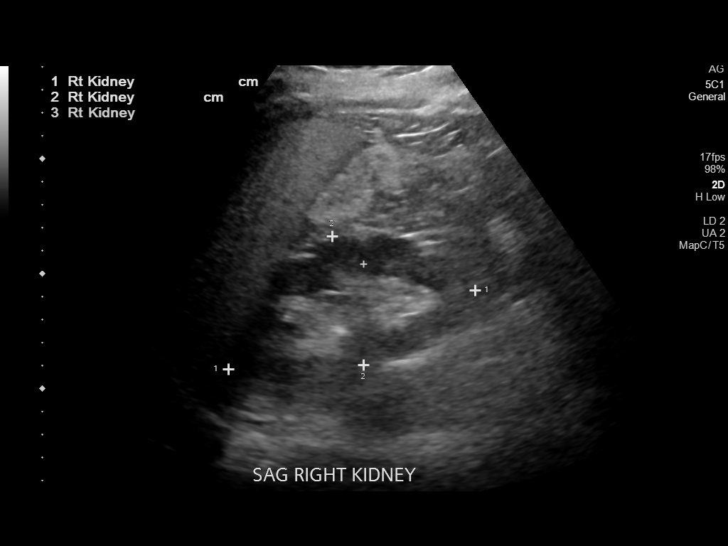
[im 8/24]
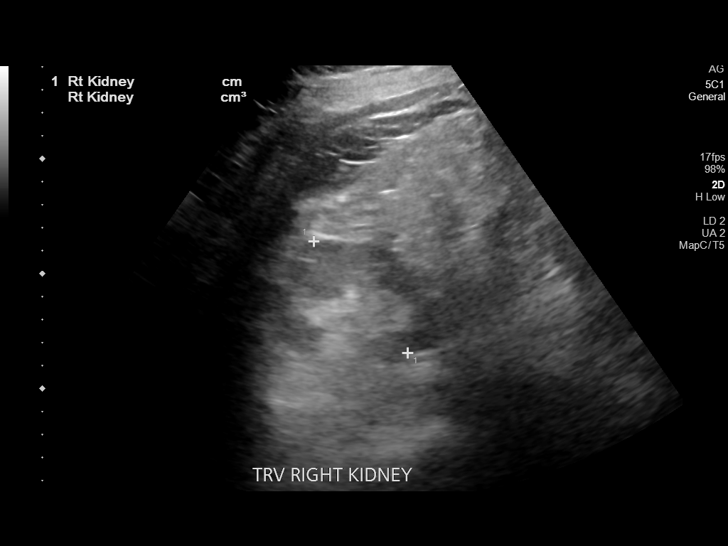
[im 10/24]
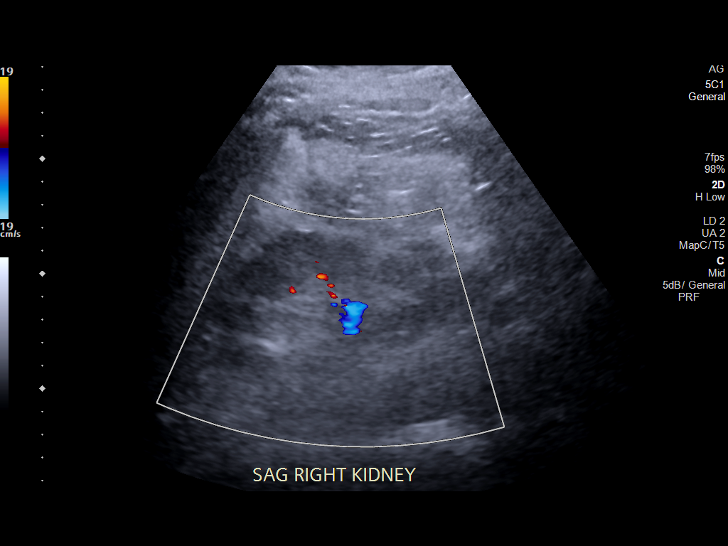
[im 12/24]
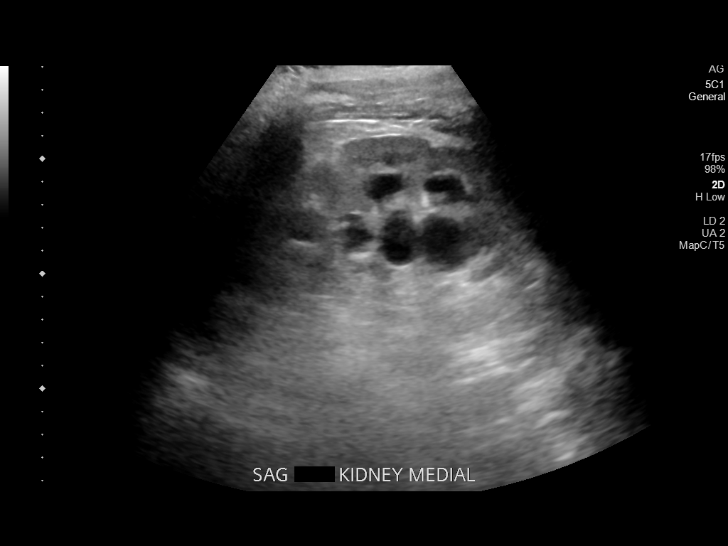
[im 13/24]
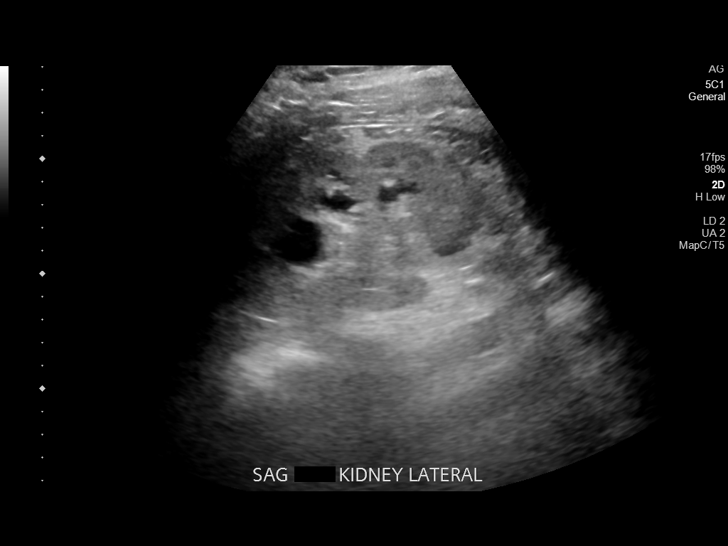
[im 15/24]
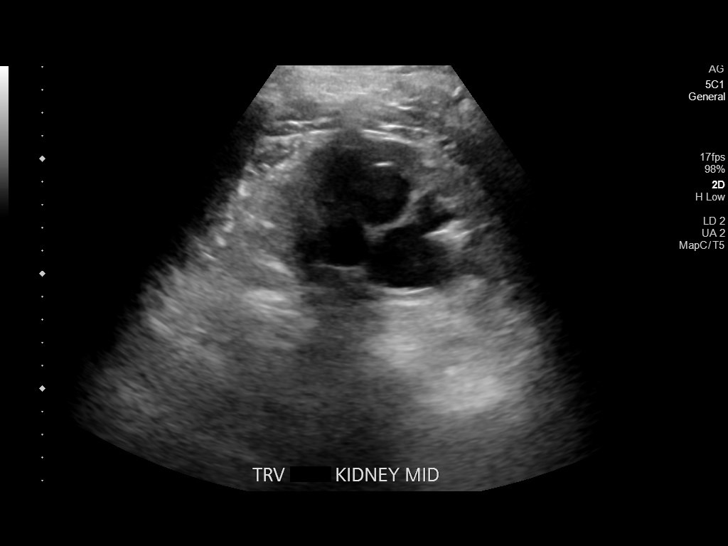
[im 17/24]
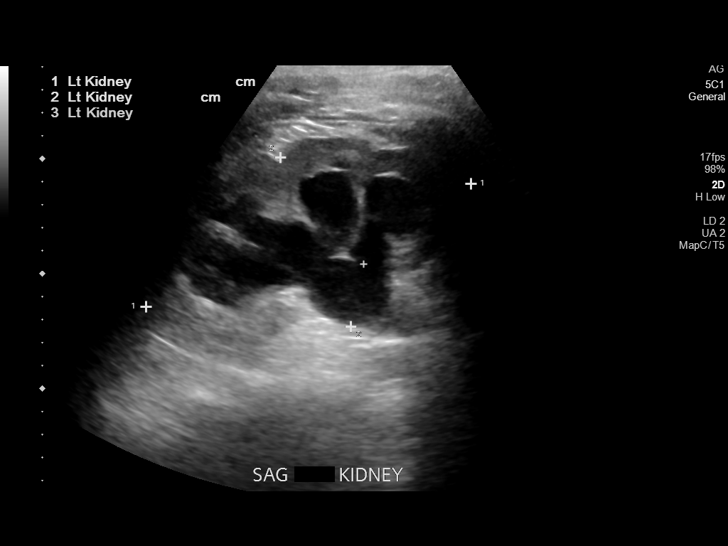
[im 19/24]
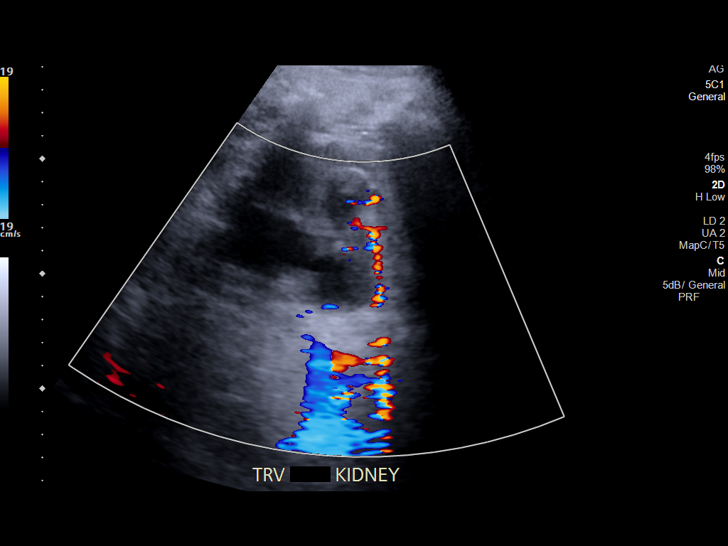
[im 20/24]
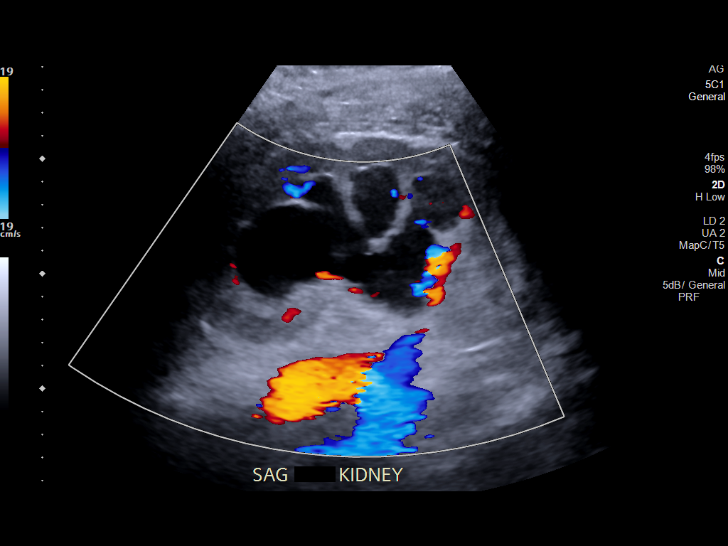
[im 22/24]
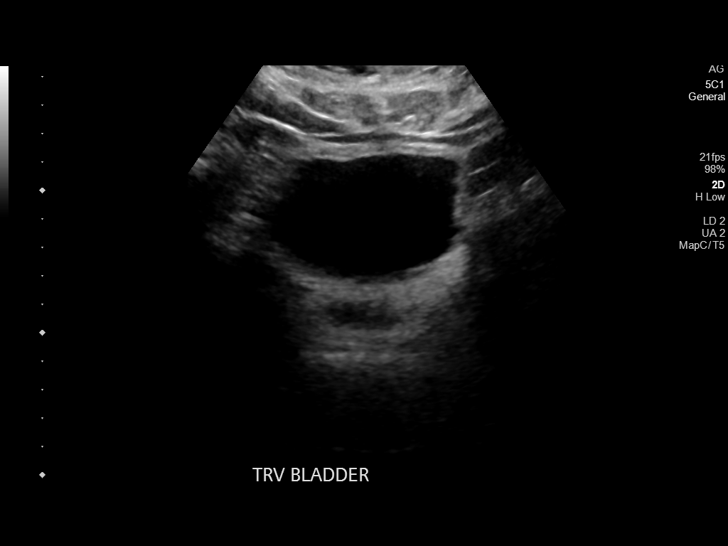
[im 24/24]
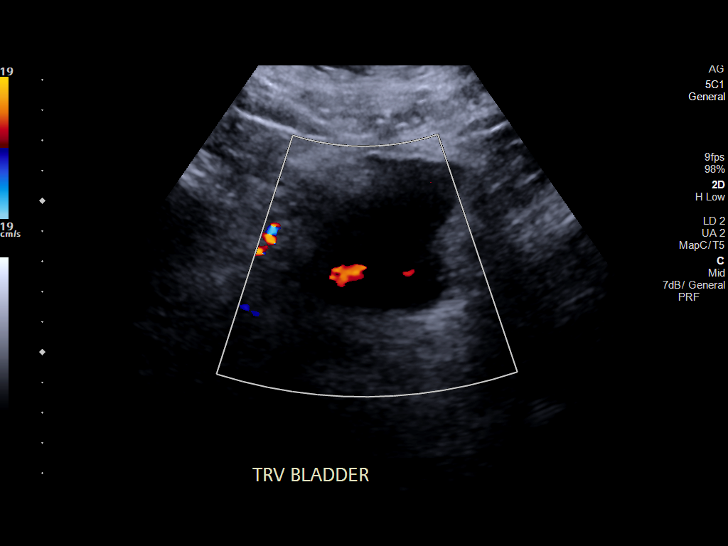

[14 of 24 positions shown; findings below may reference images not displayed]

FINDINGS: Right Kidney:

Renal measurements: 11.3 x 5.8 x 6.4 cm = volume: 216 mL.
Echogenicity within normal limits. No mass or hydronephrosis
visualized.

Left Kidney:

Renal measurements: 15.1 x 8.0 x 7.0 cm = volume: 442 mL. Moderate
to severe left hydronephrosis remains, unchanged. Increased cortical
echogenicity associated with the left kidney relative to the right.

Bladder:

Appears normal for degree of bladder distention.

Other:

None.
IMPRESSION: 1. Moderate to severe left hydronephrosis persists, unchanged since
April 03, 2021. The mild increased echogenicity in the left renal
cortex versus the right is likely due to sequela of obstructive
uropathy.
2. The renal stones seen on the CT scan from March 11, 2021 are not
visualized on today's ultrasound.

## 2021-10-28 ENCOUNTER — Emergency Department (HOSPITAL_COMMUNITY)
Admission: EM | Admit: 2021-10-28 | Discharge: 2021-10-28 | Disposition: A | Discharge status: Home / Self Care | Payer: BC Managed Care – PPO | Attending: Emergency Medicine | Admitting: Emergency Medicine

## 2021-10-28 ENCOUNTER — Other Ambulatory Visit: Payer: Self-pay

## 2021-10-28 ENCOUNTER — Encounter (HOSPITAL_COMMUNITY): Payer: Self-pay | Admitting: Emergency Medicine

## 2021-10-28 DIAGNOSIS — R109 Unspecified abdominal pain: Secondary | ICD-10-CM | POA: Diagnosis not present

## 2021-10-28 DIAGNOSIS — N23 Unspecified renal colic: Secondary | ICD-10-CM | POA: Diagnosis not present

## 2021-10-28 DIAGNOSIS — N1330 Unspecified hydronephrosis: Secondary | ICD-10-CM | POA: Insufficient documentation

## 2021-10-28 DIAGNOSIS — Z79899 Other long term (current) drug therapy: Secondary | ICD-10-CM | POA: Insufficient documentation

## 2021-10-28 DIAGNOSIS — Z87891 Personal history of nicotine dependence: Secondary | ICD-10-CM | POA: Diagnosis not present

## 2021-10-28 DIAGNOSIS — N132 Hydronephrosis with renal and ureteral calculous obstruction: Secondary | ICD-10-CM | POA: Diagnosis not present

## 2021-10-28 LAB — COMPREHENSIVE METABOLIC PANEL
ALT: 17 U/L (ref 0–44)
AST: 22 U/L (ref 15–41)
Albumin: 4 g/dL (ref 3.5–5.0)
Alkaline Phosphatase: 61 U/L (ref 38–126)
Anion gap: 11 (ref 5–15)
BUN: 16 mg/dL (ref 6–20)
CO2: 24 mmol/L (ref 22–32)
Calcium: 9.5 mg/dL (ref 8.9–10.3)
Chloride: 103 mmol/L (ref 98–111)
Creatinine, Ser: 1.08 mg/dL (ref 0.61–1.24)
GFR, Estimated: >60 mL/min (ref >60)
Glucose, Bld: 122 mg/dL — ABNORMAL HIGH (ref 70–99)
Potassium: 3.6 mmol/L (ref 3.5–5.1)
Sodium: 138 mmol/L (ref 135–145)
Total Bilirubin: 0.5 mg/dL (ref 0.3–1.2)
Total Protein: 7.1 g/dL (ref 6.5–8.1)

## 2021-10-28 LAB — CBC WITH DIFFERENTIAL/PLATELET
Abs Immature Granulocytes: 0.01 10*3/uL (ref 0.00–0.07)
Basophils Absolute: 0 10*3/uL (ref 0.0–0.1)
Basophils Relative: 1 %
Eosinophils Absolute: 0.1 10*3/uL (ref 0.0–0.5)
Eosinophils Relative: 2 %
HCT: 44 % (ref 39.0–52.0)
Hemoglobin: 15.1 g/dL (ref 13.0–17.0)
Immature Granulocytes: 0 %
Lymphocytes Relative: 38 %
Lymphs Abs: 2.9 10*3/uL (ref 0.7–4.0)
MCH: 29.1 pg (ref 26.0–34.0)
MCHC: 34.3 g/dL (ref 30.0–36.0)
MCV: 84.8 fL (ref 80.0–100.0)
Monocytes Absolute: 0.6 10*3/uL (ref 0.1–1.0)
Monocytes Relative: 8 %
Neutro Abs: 3.9 10*3/uL (ref 1.7–7.7)
Neutrophils Relative %: 51 %
Platelets: 245 10*3/uL (ref 150–400)
RBC: 5.19 MIL/uL (ref 4.22–5.81)
RDW: 13.2 % (ref 11.5–15.5)
WBC: 7.6 10*3/uL (ref 4.0–10.5)
nRBC: 0 % (ref 0.0–0.2)

## 2021-10-28 LAB — URINALYSIS, ROUTINE W REFLEX MICROSCOPIC
Bilirubin Urine: NEGATIVE
Glucose, UA: NEGATIVE mg/dL
Ketones, ur: NEGATIVE mg/dL
Leukocytes,Ua: NEGATIVE
Nitrite: NEGATIVE
Protein, ur: 100 mg/dL — AB
Specific Gravity, Urine: 1.017 (ref 1.005–1.030)
pH: 6 (ref 5.0–8.0)

## 2021-10-28 MED ORDER — KETOROLAC TROMETHAMINE 15 MG/ML IJ SOLN
15.0000 mg | Freq: Once | INTRAMUSCULAR | Status: AC
  Administered 2021-10-28: 15 mg via INTRAVENOUS
  Filled 2021-10-28: qty 1

## 2021-10-28 MED ORDER — OXYCODONE HCL 5 MG PO TABS
2.5000 mg | ORAL_TABLET | Freq: Once | ORAL | Status: AC
  Administered 2021-10-28: 2.5 mg via ORAL
  Filled 2021-10-28: qty 1

## 2021-10-28 MED ORDER — SODIUM CHLORIDE 0.9 % IV SOLN
1000.0000 mL | INTRAVENOUS | Status: DC
  Administered 2021-10-28: 1000 mL via INTRAVENOUS

## 2021-10-28 MED ORDER — ONDANSETRON 4 MG PO TBDP: 4.0000 mg | ORAL_TABLET | Freq: Three times a day (TID) | ORAL | 0 refills | Status: AC | PRN

## 2021-10-28 MED ORDER — OXYCODONE HCL 5 MG PO TABS: 2.5000 mg | ORAL_TABLET | Freq: Four times a day (QID) | ORAL | 0 refills | Status: AC | PRN

## 2021-10-28 MED ORDER — TAMSULOSIN HCL 0.4 MG PO CAPS
0.4000 mg | ORAL_CAPSULE | Freq: Once | ORAL | Status: AC
  Administered 2021-10-28: 0.4 mg via ORAL
  Filled 2021-10-28: qty 1

## 2021-10-28 MED ORDER — SODIUM CHLORIDE 0.9 % IV BOLUS (SEPSIS)
1000.0000 mL | Freq: Once | INTRAVENOUS | Status: AC
  Administered 2021-10-28: 1000 mL via INTRAVENOUS

## 2021-10-28 MED ORDER — METOCLOPRAMIDE HCL 5 MG/ML IJ SOLN
10.0000 mg | Freq: Once | INTRAMUSCULAR | Status: AC
  Administered 2021-10-28: 10 mg via INTRAVENOUS
  Filled 2021-10-28: qty 2

## 2021-10-28 MED ORDER — ONDANSETRON HCL 4 MG/2ML IJ SOLN
4.0000 mg | Freq: Once | INTRAMUSCULAR | Status: AC
  Administered 2021-10-28: 4 mg via INTRAVENOUS
  Filled 2021-10-28: qty 2

## 2021-10-28 MED ORDER — KETOROLAC TROMETHAMINE 15 MG/ML IJ SOLN
7.5000 mg | Freq: Once | INTRAMUSCULAR | Status: AC
  Administered 2021-10-28: 7.5 mg via INTRAVENOUS
  Filled 2021-10-28: qty 1

## 2021-10-28 MED ORDER — HYDROMORPHONE HCL 1 MG/ML IJ SOLN
1.0000 mg | Freq: Once | INTRAMUSCULAR | Status: AC
  Administered 2021-10-28: 1 mg via INTRAVENOUS
  Filled 2021-10-28: qty 1

## 2021-10-28 MED ORDER — FENTANYL CITRATE PF 50 MCG/ML IJ SOSY
50.0000 ug | PREFILLED_SYRINGE | INTRAMUSCULAR | Status: DC | PRN
  Administered 2021-10-28: 50 ug via INTRAVENOUS
  Filled 2021-10-28 (×2): qty 1

## 2021-10-28 NOTE — ED Triage Notes (Signed)
Patient reports left flank pain onset Monday with emesis , denies hematuria or dysuria .

## 2021-10-28 NOTE — ED Provider Notes (Signed)
[facility] EMERGENCY DEPARTMENT Provider Note  CSN: [id number] Arrival date & time: 10/28/21 0018  Chief Complaint(s) Flank Pain /Kidney Stones  HPI Thomas Howell is a 30 y.o. male   The history is provided by the patient.  Flank Pain This is a recurrent problem. The problem has been gradually worsening. Pertinent negatives include no chest pain, no abdominal pain, no headaches and no shortness of breath. Associated symptoms comments: Nausea and vomiting . Nothing aggravates the symptoms. Nothing relieves the symptoms. Treatments tried: flomax and tylenol. The treatment provided mild relief.   H/o renal stones. Similar to prior renal colic. Has had numerous ureteral stent and lithotripsy   Past Medical History Past Medical History:  Diagnosis Date   Complication of anesthesia    Dysrhythmia    hx of SVT ablation in 2019 - DR Cristopher Peru dismissed by MD    Frequency-urgency syndrome    History of kidney stones    Hx of nausea and vomiting    d/t kidney stone   Hx of sepsis 08/11/2017   due to kidney stone/ hydronephrosis   Left ureteral stone    Migraine    hx of migraines    Pain due to ureteral stent (HCC)    PONV (postoperative nausea and vomiting)    none recently   Scoliosis of lumbar spine 1994   treated at [facility] until age 52   Wears glasses    reading only   Patient Active Problem List   Diagnosis Date Noted   Anxiety 06/27/2021   Right ureteral stone 10/13/2018   Acute pyelonephritis 08/14/2018   Bilateral hydronephrosis 08/14/2018   Sepsis ([place]) 08/14/2018   SVT (supraventricular tachycardia) ([place]) 05/22/2018   Attention deficit hyperactivity disorder (ADHD), combined type 04/08/2018   Chronic migraine 02/14/2017   Neck pain 02/04/2016   Hydronephrosis, left 08/19/2015   Hydronephrosis determined by ultrasound    Kidney stone on left side 08/18/2015   Nephrolithiasis 08/18/2015   Kidney stone 07/06/2015   Left ureteral  stone 07/06/2015   Analgesic rebound headache 06/05/2014   Migraine headache without aura 05/30/2014   Home Medication(s) Prior to Admission medications   Medication Sig Start Date End Date Taking? Authorizing Provider  ondansetron (ZOFRAN ODT) 4 MG disintegrating tablet Take 1 tablet (4 mg total) by mouth every 8 (eight) hours as needed for up to 3 days for nausea or vomiting. 10/28/21 10/31/21 Yes Shakeela Rabadan, Grayce Sessions, MD  oxyCODONE (ROXICODONE) 5 MG immediate release tablet Take 0.5-1 tablets (2.5-5 mg total) by mouth every 6 (six) hours as needed for up to 5 days for severe pain. 10/28/21 11/02/21 Yes Kyaira Trantham, Grayce Sessions, MD  acetaminophen (TYLENOL) 500 MG tablet Take 1,000 mg by mouth every 8 (eight) hours as needed for headache or moderate pain.    [provider]  hydrochlorothiazide (HYDRODIURIL) 25 MG tablet Take 25 mg by mouth daily. For kidney stone prevention    [provider]  hydrOXYzine (ATARAX/VISTARIL) 10 MG tablet Take 1/2-1 tab po qd prn panic attacks and 1 tab po qhs prn sleep Patient taking differently: Take 10 mg by mouth daily as needed for anxiety. 06/26/21   Nilda Simmer, NP  rizatriptan (MAXALT-MLT) 10 MG disintegrating tablet Take 1 tablet (10 mg total) by mouth daily as needed for migraine. May repeat in 2 hrs if needed; max 2 per 24 hrs 06/26/21   Nilda Simmer, NP  sertraline (ZOLOFT) 50 MG tablet Take 1/2 tab po qd x  6 days then one po qd Patient taking differently: Take 50 mg by mouth at bedtime. 06/26/21   Campbell RichesHoskins, Carolyn C, NP  tamsulosin (FLOMAX) 0.4 MG CAPS capsule Take 1 capsule (0.4 mg total) by mouth daily. Patient taking differently: Take 0.4 mg by mouth daily after supper. 04/03/21   Fayrene Helperran, [place], PA-C                                                                                                                                    Past Surgical History Past Surgical History:  Procedure Laterality Date   CYSTOSCOPY W/ RETROGRADES  Right 07/06/2015   Procedure: CYSTOSCOPY WITH RETROGRADE PYELOGRAM;  Surgeon: Jerilee FieldMatthew Eskridge, MD;  Location: [facility];  Service: Urology;  Laterality: Right;   CYSTOSCOPY W/ URETERAL STENT PLACEMENT Left 08/08/2015   Procedure: CYSTOSCOPY WITH STENT REPLACEMENT;  Surgeon: Jerilee FieldMatthew Eskridge, MD;  [facility];  Service: Urology;  Laterality: Left;   CYSTOSCOPY W/ URETERAL STENT PLACEMENT Left 02/21/2017   Procedure: CYSTOSCOPY WITH RETROGRADE PYELOGRAM/URETERAL LEFT STENT PLACEMENT WITH  LASER;  Surgeon: Bjorn PippinJohn Wrenn, MD;  Location: [facility];  Service: Urology;  Laterality: Left;   CYSTOSCOPY W/ URETERAL STENT PLACEMENT Left 08/10/2017   Procedure: CYSTOSCOPY WITH RETROGRADE PYELOGRAM/URETERAL STENT PLACEMENT;  Surgeon: Heloise PurpuraBorden, Lester, MD;  Location: [facility];  Service: Urology;  Laterality: Left;   CYSTOSCOPY W/ URETERAL STENT PLACEMENT Bilateral 04/19/2021   Procedure: CYSTOSCOPY WITH RETROGRADE PYELOGRAM/URETERAL STENT PLACEMENT;  Surgeon: Sebastian AcheManny, Theodore, MD;  Location: [facility];  Service: Urology;  Laterality: Bilateral;   CYSTOSCOPY W/ URETERAL STENT PLACEMENT Left 07/11/2021   Procedure: CYSTOSCOPY WITH RETROGRADE PYELOGRAM/URETEROSCOPY/URETERAL STENT PLACEMENT;  Surgeon: Marcine Matarahlstedt, Stephen, MD;  Location: [facility];  Service: Urology;  Laterality: Left;   CYSTOSCOPY WITH RETROGRADE PYELOGRAM, URETEROSCOPY AND STENT PLACEMENT Left 06/24/2014   Procedure: CYSTOSCOPY WITH RETROGRADE PYELOGRAM,  AND STENT PLACEMENT;  Surgeon: Magdalene Mollyaniel Y Woodruff, MD;  Location: [facility];  Service: Urology;  Laterality: Left;   CYSTOSCOPY WITH RETROGRADE PYELOGRAM, URETEROSCOPY AND STENT PLACEMENT Left 07/05/2014   Procedure: CYSTO/LEFT URETEROSCOPY/LEFT RETROGRADE PYELOGRAM/LEFT STENT PLACEMENT;  Surgeon: Jerilee FieldMatthew Eskridge, MD;  [facility] ;  Service: Urology;  Laterality: Left;   CYSTOSCOPY WITH RETROGRADE PYELOGRAM, URETEROSCOPY AND STENT PLACEMENT Left 07/06/2015   Procedure:  CYSTOSCOPY WITH RETROGRADE PYELOGRAM, URETEROSCOPY , LASER, STENT PLACEMENT and BASKET EXTRACTION;  Surgeon: Jerilee FieldMatthew Eskridge, MD;  Location[facility];  Service: Urology;  Laterality: Left;   CYSTOSCOPY WITH RETROGRADE PYELOGRAM, URETEROSCOPY AND STENT PLACEMENT Left 08/19/2015   Procedure: CYSTOSCOPY WITH RETROGRADE PYELOGRAM, URETEROSCOPY AND STENT PLACEMENT;  Surgeon: Jerilee FieldMatthew Eskridge, MD;  Location[facility];  Service: Urology;  Laterality: Left;   CYSTOSCOPY WITH RETROGRADE PYELOGRAM, URETEROSCOPY AND STENT PLACEMENT Left 03/13/2018   Procedure: CYSTOSCOPY WITH RETROGRADE PYELOGRAM, URETEROSCOPY AND LEFT STENT PLACEMENT;  Surgeon: Bjorn PippinWrenn, John, MD;  Location[facility];  Service: Urology;  Laterality: Left;   CYSTOSCOPY WITH RETROGRADE PYELOGRAM, URETEROSCOPY  AND STENT PLACEMENT Left 03/11/2021   Procedure: CYSTOSCOPY WITH RETROGRADE PYELOGRAM, URETEROSCOPY AND STENT PLACEMENT,STONE EXTRACTION,HOLMIUM LASER;  Surgeon: Marcine Matar, MD;  Location: [facility];  Service: Urology;  Laterality: Left;   CYSTOSCOPY WITH RETROGRADE PYELOGRAM, URETEROSCOPY AND STENT PLACEMENT Bilateral 05/06/2021   Procedure: CYSTOSCOPY WITH RETROGRADE PYELOGRAM, URETEROSCOPY AND STENT REPLACEMENT;  Surgeon: Sebastian Ache, MD;  Location: [facility];  Service: Urology;  Laterality: Bilateral;  90 MINS   CYSTOSCOPY WITH URETEROSCOPY AND STENT PLACEMENT Left 01/16/2016   Procedure: CYSTOSCOPY WITH LEFT RETROGRADE PYELOGRAM  LEFT DIGITAL URETEROSCOPY AND PLACEMENT LEFT URETERAL STENT;  Surgeon: Jerilee Field, MD;  Location: [facility];  Service: Urology;  Laterality: Left;   CYSTOSCOPY WITH URETEROSCOPY, STONE BASKETRY AND STENT PLACEMENT Left 02/13/2016   Procedure: CYSTOSCOPY WITH LEFT URETEROSCOPY, HOLMIUM LASER AND STENT PLACEMENT;  Surgeon: Jerilee Field, MD;  Location: [facility];  Service: Urology;  Laterality: Left;   CYSTOSCOPY/RETROGRADE/URETEROSCOPY/STONE EXTRACTION WITH BASKET Left 08/08/2015   Procedure:  CYSTOSCOPY/RETROGRADE/URETEROSCOPY/STONE EXTRACTION WITH BASKET;  Surgeon: Jerilee Field, MD;  Location: [facility];  Service: Urology;  Laterality: Left;   CYSTOSCOPY/URETEROSCOPY/HOLMIUM LASER/STENT PLACEMENT Left 07/30/2016   Procedure: CYSTOSCOPY/URETEROSCOPY/HOLMIUM LASER/STENT PLACEMENT;  Surgeon: Jerilee Field, MD;  Location: [facility];  Service: Urology;  Laterality: Left;   CYSTOSCOPY/URETEROSCOPY/HOLMIUM LASER/STENT PLACEMENT Left 08/19/2017   Procedure: CYSTOSCOPY/URETEROSCOPY/HOLMIUM LASER/STENT PLACEMENT;  Surgeon: Jerilee Field, MD;  Location: [facility];  Service: Urology;  Laterality: Left;   CYSTOSCOPY/URETEROSCOPY/HOLMIUM LASER/STENT PLACEMENT N/A 10/13/2018   Procedure: CYSTOSCOPY/RETROGRADE RIGHT URETEROSCOPY/ AND RIGHTSTENT PLACEMENT;  Surgeon: Bjorn Pippin, MD;  Location: [facility];  Service: Urology;  Laterality: N/A;   EXTRACORPOREAL SHOCK WAVE LITHOTRIPSY Left 07-07-2015  &  12-26-2014   FOOT CAPSULE RELEASE W/ PERCUTANEOUS HEEL CORD LENGTHENING, TIBIAL TENDON TRANSFER Left 1994   clubfoot   HOLMIUM LASER APPLICATION Left 07/05/2014   Procedure: LASER LITHO;  Surgeon: Jerilee Field, MD;  Location: [facility];  Service: Urology;  Laterality: Left;   HOLMIUM LASER APPLICATION Left 08/08/2015   Procedure: HOLMIUM LASER  WITH LITHOTRIPSY ;  Surgeon: Jerilee Field, MD;  Location: [facility];  Service: Urology;  Laterality: Left;   HOLMIUM LASER APPLICATION Left 02/21/2017   Procedure: HOLMIUM LASER APPLICATION;  Surgeon: Bjorn Pippin, MD;  Location[facility];  Service: Urology;  Laterality: Left;   HOLMIUM LASER APPLICATION Left 03/13/2018   Procedure: HOLMIUM LASER APPLICATION;  Surgeon: Bjorn Pippin, MD;  Location[facility];  Service: Urology;  Laterality: Left;   HOLMIUM LASER APPLICATION Bilateral 05/06/2021   Procedure: HOLMIUM LASER APPLICATION;  Surgeon: Sebastian Ache, MD;  Location:  [facility];  Service: Urology;  Laterality: Bilateral;   lumpectomy in neck for cat scratch fever      surgery for club foot at few days old   LYMPH GLAND EXCISION  2003   neck--  benign   SVT ABLATION N/A 05/22/2018   Procedure: SVT ABLATION;  Surgeon: Marinus Maw, MD;  Location: [facility];  Service: Cardiovascular;  Laterality: N/A;   SVT ABLATION     2019    URETEROSCOPY WITH HOLMIUM LASER LITHOTRIPSY Left 08/04/2021   Procedure: URETEROSCOPY WITH basket removal of stones/ STENT exchange/retrograde;  Surgeon: Jerilee Field, MD;  Location: [facility];  Service: Urology;  Laterality: Left;  ONLY NEEDS 60 MIN   Family History Family History  Problem Relation Age of Onset   Cancer Father    Kidney Stones Mother    Cancer Other  Hypertension Other    Hyperlipidemia Other    Stroke Other    Kidney Stones Brother     Social History Social History   Tobacco Use   Smoking status: Former    Years: 2.00    Types: Cigarettes    Quit date: 07/29/2014    Years since quitting: 7.2   Smokeless tobacco: Never   Tobacco comments:    social smoker  Vaping Use   Vaping Use: Never used  Substance Use Topics   Alcohol use: Not Currently    Alcohol/week: 0.0 standard drinks   Drug use: No   Allergies Bactrim [sulfamethoxazole-trimethoprim] and Ketamine  Review of Systems Review of Systems  Respiratory:  Negative for shortness of breath.   Cardiovascular:  Negative for chest pain.  Gastrointestinal:  Negative for abdominal pain.  Genitourinary:  Positive for flank pain.  Neurological:  Negative for headaches.  All other systems are reviewed and are negative for acute change except as noted in the HPI  Physical Exam Vital Signs  I have reviewed the triage vital signs BP 112/79   Pulse 78   Temp 98.7 F (37.1 C) (Oral)   Resp 19   SpO2 96%   Physical Exam Vitals reviewed.  Constitutional:      General: He is not in acute distress.     Appearance: He is well-developed. He is not diaphoretic.  HENT:     Head: Normocephalic and atraumatic.     Nose: Nose normal.  Eyes:     General: No scleral icterus.       Right eye: No discharge.        Left eye: No discharge.     Conjunctiva/sclera: Conjunctivae normal.     Pupils: Pupils are equal, round, and reactive to light.  Cardiovascular:     Rate and Rhythm: Normal rate and regular rhythm.     Heart sounds: No murmur heard.   No friction rub. No gallop.  Pulmonary:     Effort: Pulmonary effort is normal. No respiratory distress.     Breath sounds: Normal breath sounds. No stridor. No rales.  Abdominal:     General: There is no distension.     Palpations: Abdomen is soft.     Tenderness: There is no abdominal tenderness.  Musculoskeletal:        General: No tenderness.     Cervical back: Normal range of motion and neck supple.  Skin:    General: Skin is warm and dry.     Findings: No erythema or rash.  Neurological:     Mental Status: He is alert and oriented to person, place, and time.    ED Results and Treatments Labs (all labs ordered are listed, but only abnormal results are displayed) Labs Reviewed  COMPREHENSIVE METABOLIC PANEL - Abnormal; Notable for the following components:      Result Value   Glucose, Bld 122 (*)    All other components within normal limits  URINALYSIS, ROUTINE W REFLEX MICROSCOPIC - Abnormal; Notable for the following components:   APPearance HAZY (*)    Hgb urine dipstick MODERATE (*)    Protein, ur 100 (*)    Bacteria, UA RARE (*)    All other components within normal limits  CBC WITH DIFFERENTIAL/PLATELET  EKG  EKG Interpretation  Date/Time:    Ventricular Rate:    PR Interval:    QRS Duration:   QT Interval:    QTC Calculation:   R Axis:     Text Interpretation:         Radiology No results  found.  Pertinent labs & imaging results that were available during my care of the patient were reviewed by me and considered in my medical decision making (see MDM for details).  Medications Ordered in ED Medications  fentaNYL (SUBLIMAZE) injection 50 mcg (50 mcg Intravenous Given 10/28/21 0223)  sodium chloride 0.9 % bolus 1,000 mL (0 mLs Intravenous Stopped 10/28/21 0355)    Followed by  sodium chloride 0.9 % bolus 1,000 mL (0 mLs Intravenous Stopped 10/28/21 0529)    Followed by  0.9 %  sodium chloride infusion (1,000 mLs Intravenous New Bag/Given 10/28/21 0534)  ondansetron (ZOFRAN) injection 4 mg (4 mg Intravenous Given 10/28/21 0120)  metoCLOPramide (REGLAN) injection 10 mg (10 mg Intravenous Given 10/28/21 0238)  HYDROmorphone (DILAUDID) injection 1 mg (1 mg Intravenous Given 10/28/21 0242)  ketorolac (TORADOL) 15 MG/ML injection 15 mg (15 mg Intravenous Given 10/28/21 0239)  HYDROmorphone (DILAUDID) injection 1 mg (1 mg Intravenous Given 10/28/21 0532)  tamsulosin (FLOMAX) capsule 0.4 mg (0.4 mg Oral Given 10/28/21 0649)  oxyCODONE (Oxy IR/ROXICODONE) immediate release tablet 2.5 mg (2.5 mg Oral Given 10/28/21 0649)  ketorolac (TORADOL) 15 MG/ML injection 7.5 mg (7.5 mg Intravenous Given 10/28/21 [id number])                                                                                                                                     Procedures Ultrasound ED Renal  Date/Time: 10/28/2021 6:13 AM Performed by: Fatima Blank, MD Authorized by: Fatima Blank, MD   Procedure details:    Indications: hydronephrosis     Technique:  L kidney and R kidneyImages: archived Left kidney findings:    Nephrolithiasis: identified     Hydronephrosis: moderate   Right kidney findings:    Nephrolithiasis: not identified     Hydronephrosis: none    (including critical care time)  Medical Decision Making / ED Course I have reviewed the nursing notes for this encounter and the  patient's prior records (if available in EHR or on provided paperwork).  Thomas Howell was evaluated in Emergency Department on 10/28/2021 for the symptoms described in the history of present illness. He was evaluated in the context of the global COVID-19 pandemic, which necessitated consideration that the patient might be at risk for infection with the SARS-CoV-2 virus that causes COVID-19. Institutional protocols and algorithms that pertain to the evaluation of patients at risk for COVID-19 are in a state of rapid change based on information released by regulatory bodies including the CDC and federal and state organizations. These policies and algorithms were followed during the patient's care in the ED.  Consistent with renal colic. Prior CT notable for 1.1cm stone on left. On POCUS, stone seems to still be in lower pole. Patient numerous other retained stones on prior CT. POCUS with left hydronephrosis. Renal function intact. UA with hematuria. No infection  Pertinent labs & imaging results that were available during my care of the patient were reviewed by me and considered in my medical decision making:  Given IVF, pain meds. Tolerating PO. Pain better controlled. Plan for outpatient expectant management. Urology follow up.  Final Clinical Impression(s) / ED Diagnoses Final diagnoses:  Renal colic  Hydronephrosis of left kidney   The patient appears reasonably screened and/or stabilized for discharge and I doubt any other medical condition or other [facility] requiring further screening, evaluation, or treatment in the ED at this time prior to discharge. Safe for discharge with strict return precautions.  Disposition: Discharge  Condition: Good  I have discussed the results, Dx and Tx plan with the patient/family who expressed understanding and agree(s) with the plan. Discharge instructions discussed at length. The patient/family was given strict return precautions who  verbalized understanding of the instructions. No further questions at time of discharge.    ED Discharge Orders          Ordered    oxyCODONE (ROXICODONE) 5 MG immediate release tablet  Every 6 hours PRN        10/28/21 0611    ondansetron (ZOFRAN ODT) 4 MG disintegrating tablet  Every 8 hours PRN        10/28/21 0617            Follow Up: Festus Aloe, MD [place] [id number] [phone]  Call  to schedule an appointment for close follow up     This chart was dictated using voice recognition software.  Despite best efforts to proofread,  errors can occur which can change the documentation meaning.    Fatima Blank, MD 10/28/21 [phone]

## 2021-10-28 NOTE — Discharge Instructions (Addendum)
For pain control you may take at 1000 mg of Tylenol every 8 hours scheduled.  In addition you can take 0.5 to 1 tablet of Oxycodone every 6 hours as needed for pain not controlled with the scheduled Tylenol. ? ?

## 2021-11-02 ENCOUNTER — Other Ambulatory Visit: Payer: Self-pay | Admitting: Nurse Practitioner

## 2021-11-02 DIAGNOSIS — N201 Calculus of ureter: Secondary | ICD-10-CM | POA: Diagnosis not present

## 2021-11-02 DIAGNOSIS — N1330 Unspecified hydronephrosis: Secondary | ICD-10-CM | POA: Diagnosis not present

## 2021-11-02 MED ORDER — SERTRALINE HCL 50 MG PO TABS: ORAL_TABLET | ORAL | 0 refills | Status: DC

## 2021-11-04 ENCOUNTER — Observation Stay (HOSPITAL_COMMUNITY): Payer: BC Managed Care – PPO | Admitting: Anesthesiology

## 2021-11-04 ENCOUNTER — Ambulatory Visit: Admit: 2021-11-04 | Payer: BC Managed Care – PPO | Admitting: Urology

## 2021-11-04 ENCOUNTER — Other Ambulatory Visit: Payer: Self-pay

## 2021-11-04 ENCOUNTER — Other Ambulatory Visit: Payer: Self-pay | Admitting: Urology

## 2021-11-04 ENCOUNTER — Observation Stay (HOSPITAL_COMMUNITY)
Admission: EM | Admit: 2021-11-04 | Discharge: 2021-11-04 | Disposition: A | Discharge status: Home / Self Care | Payer: BC Managed Care – PPO | Attending: Urology | Admitting: Urology

## 2021-11-04 ENCOUNTER — Observation Stay (HOSPITAL_COMMUNITY): Payer: BC Managed Care – PPO

## 2021-11-04 ENCOUNTER — Encounter (HOSPITAL_COMMUNITY): Payer: Self-pay

## 2021-11-04 ENCOUNTER — Encounter (HOSPITAL_COMMUNITY)
Admission: EM | Disposition: A | Discharge status: Home / Self Care | Payer: Self-pay | Source: Home / Self Care | Attending: Emergency Medicine

## 2021-11-04 DIAGNOSIS — N201 Calculus of ureter: Secondary | ICD-10-CM | POA: Diagnosis present

## 2021-11-04 DIAGNOSIS — N400 Enlarged prostate without lower urinary tract symptoms: Secondary | ICD-10-CM | POA: Diagnosis not present

## 2021-11-04 DIAGNOSIS — Z87891 Personal history of nicotine dependence: Secondary | ICD-10-CM | POA: Insufficient documentation

## 2021-11-04 DIAGNOSIS — F902 Attention-deficit hyperactivity disorder, combined type: Secondary | ICD-10-CM | POA: Diagnosis not present

## 2021-11-04 DIAGNOSIS — I10 Essential (primary) hypertension: Secondary | ICD-10-CM | POA: Diagnosis not present

## 2021-11-04 DIAGNOSIS — I471 Supraventricular tachycardia: Secondary | ICD-10-CM | POA: Diagnosis not present

## 2021-11-04 DIAGNOSIS — Z20822 Contact with and (suspected) exposure to covid-19: Secondary | ICD-10-CM | POA: Diagnosis not present

## 2021-11-04 DIAGNOSIS — N132 Hydronephrosis with renal and ureteral calculous obstruction: Principal | ICD-10-CM | POA: Insufficient documentation

## 2021-11-04 DIAGNOSIS — N23 Unspecified renal colic: Secondary | ICD-10-CM

## 2021-11-04 HISTORY — PX: CYSTOSCOPY W/ URETERAL STENT PLACEMENT: SHX1429

## 2021-11-04 LAB — BASIC METABOLIC PANEL
Anion gap: 10 (ref 5–15)
BUN: 24 mg/dL — ABNORMAL HIGH (ref 6–20)
CO2: 25 mmol/L (ref 22–32)
Calcium: 9.5 mg/dL (ref 8.9–10.3)
Chloride: 103 mmol/L (ref 98–111)
Creatinine, Ser: 1.15 mg/dL (ref 0.61–1.24)
GFR, Estimated: >60 mL/min (ref >60)
Glucose, Bld: 116 mg/dL — ABNORMAL HIGH (ref 70–99)
Potassium: 3.4 mmol/L — ABNORMAL LOW (ref 3.5–5.1)
Sodium: 138 mmol/L (ref 135–145)

## 2021-11-04 LAB — URINALYSIS, ROUTINE W REFLEX MICROSCOPIC
Bilirubin Urine: NEGATIVE
Glucose, UA: NEGATIVE mg/dL
Hgb urine dipstick: NEGATIVE
Ketones, ur: NEGATIVE mg/dL
Leukocytes,Ua: NEGATIVE
Nitrite: NEGATIVE
Protein, ur: NEGATIVE mg/dL
Specific Gravity, Urine: 1.023 (ref 1.005–1.030)
pH: 5 (ref 5.0–8.0)

## 2021-11-04 LAB — RESP PANEL BY RT-PCR (FLU A&B, COVID) ARPGX2
Influenza A by PCR: NEGATIVE
Influenza B by PCR: NEGATIVE
SARS Coronavirus 2 by RT PCR: NEGATIVE

## 2021-11-04 LAB — CBC WITH DIFFERENTIAL/PLATELET
Abs Immature Granulocytes: 0.03 10*3/uL (ref 0.00–0.07)
Basophils Absolute: 0 10*3/uL (ref 0.0–0.1)
Basophils Relative: 0 %
Eosinophils Absolute: 0.1 10*3/uL (ref 0.0–0.5)
Eosinophils Relative: 1 %
HCT: 41.1 % (ref 39.0–52.0)
Hemoglobin: 14.2 g/dL (ref 13.0–17.0)
Immature Granulocytes: 0 %
Lymphocytes Relative: 30 %
Lymphs Abs: 2.4 10*3/uL (ref 0.7–4.0)
MCH: 29.5 pg (ref 26.0–34.0)
MCHC: 34.5 g/dL (ref 30.0–36.0)
MCV: 85.3 fL (ref 80.0–100.0)
Monocytes Absolute: 0.8 10*3/uL (ref 0.1–1.0)
Monocytes Relative: 10 %
Neutro Abs: 4.6 10*3/uL (ref 1.7–7.7)
Neutrophils Relative %: 59 %
Platelets: 151 10*3/uL (ref 150–400)
RBC: 4.82 MIL/uL (ref 4.22–5.81)
RDW: 12.9 % (ref 11.5–15.5)
WBC: 7.9 10*3/uL (ref 4.0–10.5)
nRBC: 0 % (ref 0.0–0.2)

## 2021-11-04 LAB — HIV ANTIBODY (ROUTINE TESTING W REFLEX): HIV Screen 4th Generation wRfx: NONREACTIVE

## 2021-11-04 SURGERY — CYSTOSCOPY, WITH RETROGRADE PYELOGRAM AND URETERAL STENT INSERTION
Anesthesia: General | Laterality: Left

## 2021-11-04 MED ORDER — DEXAMETHASONE SODIUM PHOSPHATE 10 MG/ML IJ SOLN
INTRAMUSCULAR | Status: AC
  Filled 2021-11-04: qty 1

## 2021-11-04 MED ORDER — HYDROCHLOROTHIAZIDE 12.5 MG PO TABS
25.0000 mg | ORAL_TABLET | Freq: Every day | ORAL | Status: DC
  Administered 2021-11-04: 25 mg via ORAL
  Filled 2021-11-04: qty 2

## 2021-11-04 MED ORDER — SENNA 8.6 MG PO TABS
1.0000 | ORAL_TABLET | Freq: Two times a day (BID) | ORAL | Status: DC
  Administered 2021-11-04: 8.6 mg via ORAL
  Filled 2021-11-04: qty 1

## 2021-11-04 MED ORDER — MORPHINE SULFATE (PF) 2 MG/ML IV SOLN
2.0000 mg | INTRAVENOUS | Status: DC | PRN
  Administered 2021-11-04: 4 mg via INTRAVENOUS
  Filled 2021-11-04: qty 2

## 2021-11-04 MED ORDER — ONDANSETRON HCL 4 MG/2ML IJ SOLN
INTRAMUSCULAR | Status: AC
  Filled 2021-11-04: qty 2

## 2021-11-04 MED ORDER — RIZATRIPTAN BENZOATE 10 MG PO TBDP: 10.0000 mg | ORAL_TABLET | Freq: Every day | ORAL | Status: DC | PRN

## 2021-11-04 MED ORDER — LACTATED RINGERS IV SOLN: INTRAVENOUS | Status: DC

## 2021-11-04 MED ORDER — FENTANYL CITRATE PF 50 MCG/ML IJ SOSY
100.0000 ug | PREFILLED_SYRINGE | Freq: Once | INTRAMUSCULAR | Status: AC
  Administered 2021-11-04: 100 ug via INTRAVENOUS
  Filled 2021-11-04: qty 2

## 2021-11-04 MED ORDER — OXYCODONE HCL 5 MG PO TABS: 5.0000 mg | ORAL_TABLET | ORAL | 0 refills | Status: DC | PRN

## 2021-11-04 MED ORDER — SUCCINYLCHOLINE CHLORIDE 200 MG/10ML IV SOSY
PREFILLED_SYRINGE | INTRAVENOUS | Status: AC
  Filled 2021-11-04: qty 10

## 2021-11-04 MED ORDER — CEPHALEXIN 500 MG PO CAPS: 500.0000 mg | ORAL_CAPSULE | Freq: Two times a day (BID) | ORAL | 0 refills | Status: AC

## 2021-11-04 MED ORDER — DEXAMETHASONE SODIUM PHOSPHATE 10 MG/ML IJ SOLN
INTRAMUSCULAR | Status: DC | PRN
  Administered 2021-11-04: 10 mg via INTRAVENOUS

## 2021-11-04 MED ORDER — SODIUM CHLORIDE 0.9 % IR SOLN
Status: DC | PRN
  Administered 2021-11-04: 3000 mL

## 2021-11-04 MED ORDER — LIDOCAINE 2% (20 MG/ML) 5 ML SYRINGE
INTRAMUSCULAR | Status: DC | PRN
  Administered 2021-11-04: 100 mg via INTRAVENOUS

## 2021-11-04 MED ORDER — FENTANYL CITRATE PF 50 MCG/ML IJ SOSY: 25.0000 ug | PREFILLED_SYRINGE | INTRAMUSCULAR | Status: DC | PRN

## 2021-11-04 MED ORDER — ONDANSETRON HCL 4 MG/2ML IJ SOLN
4.0000 mg | Freq: Once | INTRAMUSCULAR | Status: AC
  Administered 2021-11-04: 4 mg via INTRAVENOUS
  Filled 2021-11-04: qty 2

## 2021-11-04 MED ORDER — ACETAMINOPHEN 500 MG PO TABS
1000.0000 mg | ORAL_TABLET | Freq: Once | ORAL | Status: AC
  Administered 2021-11-04: 1000 mg via ORAL
  Filled 2021-11-04: qty 2

## 2021-11-04 MED ORDER — STERILE WATER FOR IRRIGATION IR SOLN
Status: DC | PRN
  Administered 2021-11-04: 1000 mL

## 2021-11-04 MED ORDER — SODIUM CHLORIDE 0.9 % IV SOLN: INTRAVENOUS | Status: DC

## 2021-11-04 MED ORDER — MIDAZOLAM HCL 2 MG/2ML IJ SOLN
INTRAMUSCULAR | Status: AC
  Filled 2021-11-04: qty 2

## 2021-11-04 MED ORDER — SUMATRIPTAN SUCCINATE 50 MG PO TABS: 50.0000 mg | ORAL_TABLET | ORAL | Status: DC | PRN

## 2021-11-04 MED ORDER — PROMETHAZINE HCL 25 MG/ML IJ SOLN
6.2500 mg | INTRAMUSCULAR | Status: DC | PRN
  Administered 2021-11-04: 6.25 mg via INTRAVENOUS

## 2021-11-04 MED ORDER — DIPHENHYDRAMINE HCL 50 MG/ML IJ SOLN: 12.5000 mg | Freq: Four times a day (QID) | INTRAMUSCULAR | Status: DC | PRN

## 2021-11-04 MED ORDER — ONDANSETRON HCL 4 MG/2ML IJ SOLN
INTRAMUSCULAR | Status: DC | PRN
  Administered 2021-11-04: 4 mg via INTRAVENOUS

## 2021-11-04 MED ORDER — KETOROLAC TROMETHAMINE 30 MG/ML IJ SOLN
30.0000 mg | Freq: Once | INTRAMUSCULAR | Status: AC
  Administered 2021-11-04: 30 mg via INTRAVENOUS
  Filled 2021-11-04: qty 1

## 2021-11-04 MED ORDER — ZOLPIDEM TARTRATE 5 MG PO TABS: 5.0000 mg | ORAL_TABLET | Freq: Every evening | ORAL | Status: DC | PRN

## 2021-11-04 MED ORDER — HYDROMORPHONE HCL 1 MG/ML IJ SOLN
1.0000 mg | INTRAMUSCULAR | Status: DC | PRN
  Administered 2021-11-04 (×2): 1 mg via INTRAVENOUS
  Filled 2021-11-04 (×2): qty 1

## 2021-11-04 MED ORDER — OXYCODONE HCL 5 MG PO TABS: 5.0000 mg | ORAL_TABLET | Freq: Once | ORAL | Status: DC | PRN

## 2021-11-04 MED ORDER — ONDANSETRON HCL 4 MG/2ML IJ SOLN
4.0000 mg | INTRAMUSCULAR | Status: AC
  Administered 2021-11-04: 4 mg via INTRAVENOUS

## 2021-11-04 MED ORDER — FENTANYL CITRATE (PF) 250 MCG/5ML IJ SOLN
INTRAMUSCULAR | Status: DC | PRN
  Administered 2021-11-04: 100 ug via INTRAVENOUS

## 2021-11-04 MED ORDER — HYDROXYZINE HCL 10 MG PO TABS: 10.0000 mg | ORAL_TABLET | Freq: Every day | ORAL | Status: DC | PRN

## 2021-11-04 MED ORDER — LIDOCAINE HCL (PF) 2 % IJ SOLN
INTRAMUSCULAR | Status: AC
  Filled 2021-11-04: qty 5

## 2021-11-04 MED ORDER — PROPOFOL 10 MG/ML IV BOLUS
INTRAVENOUS | Status: AC
  Filled 2021-11-04: qty 20

## 2021-11-04 MED ORDER — CHLORHEXIDINE GLUCONATE 0.12 % MT SOLN
15.0000 mL | OROMUCOSAL | Status: AC
  Administered 2021-11-04: 15 mL via OROMUCOSAL

## 2021-11-04 MED ORDER — TAMSULOSIN HCL 0.4 MG PO CAPS
0.4000 mg | ORAL_CAPSULE | Freq: Every day | ORAL | Status: DC
  Administered 2021-11-04: 0.4 mg via ORAL
  Filled 2021-11-04: qty 1

## 2021-11-04 MED ORDER — SODIUM CHLORIDE 0.9 % IV BOLUS
1000.0000 mL | Freq: Once | INTRAVENOUS | Status: AC
  Administered 2021-11-04: 1000 mL via INTRAVENOUS

## 2021-11-04 MED ORDER — DOCUSATE SODIUM 100 MG PO CAPS
100.0000 mg | ORAL_CAPSULE | Freq: Two times a day (BID) | ORAL | Status: DC
  Administered 2021-11-04: 100 mg via ORAL
  Filled 2021-11-04: qty 1

## 2021-11-04 MED ORDER — MIDAZOLAM HCL 5 MG/5ML IJ SOLN
INTRAMUSCULAR | Status: DC | PRN
  Administered 2021-11-04: 2 mg via INTRAVENOUS

## 2021-11-04 MED ORDER — ONDANSETRON HCL 4 MG/2ML IJ SOLN: 4.0000 mg | INTRAMUSCULAR | Status: DC | PRN

## 2021-11-04 MED ORDER — OXYCODONE HCL 5 MG PO TABS: 5.0000 mg | ORAL_TABLET | ORAL | Status: DC | PRN

## 2021-11-04 MED ORDER — DIPHENHYDRAMINE HCL 12.5 MG/5ML PO ELIX: 12.5000 mg | ORAL_SOLUTION | Freq: Four times a day (QID) | ORAL | Status: DC | PRN

## 2021-11-04 MED ORDER — SERTRALINE HCL 50 MG PO TABS
50.0000 mg | ORAL_TABLET | Freq: Every day | ORAL | Status: DC
  Administered 2021-11-04: 50 mg via ORAL
  Filled 2021-11-04: qty 1

## 2021-11-04 MED ORDER — ACETAMINOPHEN 325 MG PO TABS: 650.0000 mg | ORAL_TABLET | ORAL | Status: DC | PRN

## 2021-11-04 MED ORDER — PROPOFOL 10 MG/ML IV BOLUS
INTRAVENOUS | Status: DC | PRN
  Administered 2021-11-04: 200 mg via INTRAVENOUS

## 2021-11-04 MED ORDER — FENTANYL CITRATE (PF) 100 MCG/2ML IJ SOLN
INTRAMUSCULAR | Status: AC
  Filled 2021-11-04: qty 2

## 2021-11-04 MED ORDER — PROMETHAZINE HCL 25 MG/ML IJ SOLN
INTRAMUSCULAR | Status: AC
  Filled 2021-11-04: qty 1

## 2021-11-04 MED ORDER — OXYCODONE HCL 5 MG/5ML PO SOLN: 5.0000 mg | Freq: Once | ORAL | Status: DC | PRN

## 2021-11-04 MED ORDER — SUCCINYLCHOLINE CHLORIDE 200 MG/10ML IV SOSY
PREFILLED_SYRINGE | INTRAVENOUS | Status: DC | PRN
  Administered 2021-11-04: 170 mg via INTRAVENOUS

## 2021-11-04 MED ORDER — HYDROMORPHONE HCL 1 MG/ML IJ SOLN
1.0000 mg | Freq: Once | INTRAMUSCULAR | Status: AC
  Administered 2021-11-04: 1 mg via INTRAVENOUS
  Filled 2021-11-04: qty 1

## 2021-11-04 MED ORDER — CEFAZOLIN SODIUM-DEXTROSE 2-4 GM/100ML-% IV SOLN
2.0000 g | INTRAVENOUS | Status: AC
  Administered 2021-11-04: 2 g via INTRAVENOUS
  Filled 2021-11-04: qty 100

## 2021-11-04 SURGICAL SUPPLY — 15 items
BAG URO CATCHER STRL LF (MISCELLANEOUS) ×3 IMPLANT
CATH URETL OPEN END 6FR 70 (CATHETERS) IMPLANT
CLOTH BEACON ORANGE TIMEOUT ST (SAFETY) ×3 IMPLANT
EXTRACTOR STONE 1.7FRX115CM (UROLOGICAL SUPPLIES) ×2 IMPLANT
GLOVE SURG ENC TEXT LTX SZ8 (GLOVE) ×3 IMPLANT
GOWN STRL REUS W/TWL XL LVL3 (GOWN DISPOSABLE) ×3 IMPLANT
GUIDEWIRE ANG ZIPWIRE 038X150 (WIRE) ×2 IMPLANT
GUIDEWIRE STR DUAL SENSOR (WIRE) ×3 IMPLANT
MANIFOLD NEPTUNE II (INSTRUMENTS) ×3 IMPLANT
PACK CYSTO (CUSTOM PROCEDURE TRAY) ×3 IMPLANT
SHEATH NAVIGATOR HD 11/13X36 (SHEATH) ×2 IMPLANT
STENT URET 6FRX26 CONTOUR (STENTS) ×2 IMPLANT
TUBING CONNECTING 10 (TUBING) ×2 IMPLANT
TUBING CONNECTING 10' (TUBING) ×1
TUBING UROLOGY SET (TUBING) ×2 IMPLANT

## 2021-11-04 NOTE — Anesthesia Procedure Notes (Signed)
Procedure Name: Intubation Date/Time: 11/04/2021 4:18 PM Performed by: Nishawn Rotan D, CRNA Pre-anesthesia Checklist: Patient identified, Emergency Drugs available, Suction available and Patient being monitored Patient Re-evaluated:Patient Re-evaluated prior to induction Oxygen Delivery Method: Circle system utilized Preoxygenation: Pre-oxygenation with 100% oxygen Induction Type: IV induction and Rapid sequence Laryngoscope Size: Mac and 4 Grade View: Grade I Tube type: Oral Tube size: 7.5 mm Number of attempts: 1 Airway Equipment and Method: Stylet Placement Confirmation: ETT inserted through vocal cords under direct vision, positive ETCO2 and breath sounds checked- equal and bilateral Secured at: 22 cm Tube secured with: Tape Dental Injury: Teeth and Oropharynx as per pre-operative assessment

## 2021-11-04 NOTE — Anesthesia Preprocedure Evaluation (Addendum)
Anesthesia Evaluation  Patient identified by MRN, date of birth, ID band Patient awake    Reviewed: Allergy & Precautions, NPO status , Patient's Chart, lab work & pertinent test results  History of Anesthesia Complications (+) PONV and history of anesthetic complications  Airway Mallampati: III  TM Distance: >3 FB Neck ROM: Full    Dental  (+) Dental Advisory Given, Teeth Intact   Pulmonary neg pulmonary ROS, former smoker,    Pulmonary exam normal        Cardiovascular hypertension, Pt. on medications Normal cardiovascular exam+ dysrhythmias Supra Ventricular Tachycardia      Neuro/Psych  Headaches, PSYCHIATRIC DISORDERS Anxiety    GI/Hepatic negative GI ROS, Neg liver ROS,   Endo/Other   Obesity   Renal/GU Renal disease     Musculoskeletal  Scoliosis    Abdominal   Peds  (+) ADHD Hematology negative hematology ROS (+)   Anesthesia Other Findings   Reproductive/Obstetrics                            Anesthesia Physical Anesthesia Plan  ASA: 2  Anesthesia Plan: General   Post-op Pain Management:    Induction: Intravenous, Rapid sequence and Cricoid pressure planned  PONV Risk Score and Plan: 3 and Treatment may vary due to age or medical condition, Ondansetron, Dexamethasone, Midazolam and Scopolamine patch - Pre-op  Airway Management Planned: Oral ETT  Additional Equipment: None  Intra-op Plan:   Post-operative Plan: Extubation in OR  Informed Consent: I have reviewed the patients History and Physical, chart, labs and discussed the procedure including the risks, benefits and alternatives for the proposed anesthesia with the patient or authorized representative who has indicated his/her understanding and acceptance.     Dental advisory given  Plan Discussed with: CRNA and Anesthesiologist  Anesthesia Plan Comments:        Anesthesia Quick Evaluation

## 2021-11-04 NOTE — Op Note (Signed)
Preoperative diagnosis: Left mid ureteral stone with hydronephrosis  Postoperative diagnosis: Left renal calculi with hydronephrosis  Principal procedure: Cystoscopy, left retrograde ureteropyelogram, left ureteroscopy (semirigid, flexible), extraction of left renal calculi, placement of 6 French by 26 cm contour double-J stent with tether  Surgeon: Keiva Dina  Anesthesia: General with LMA  Complications: None  Estimated blood loss: None  Specimens: Stone fragments  Drains: None  Indications: 30 year old male with recurrent urolithiasis.  Presenting with intractable left flank pain.  He has a long history of not passing even small stones, and has narrowing of his left mid ureter.  Recent evaluation included KUB and ultrasound revealing left hydronephrosis and a calcification at approximately L4 level in the left ureter.  He presents at this time for ureteroscopic management of this.  The patient has been through multiple previous procedures, is aware of risk, complications, and desires to proceed.  Findings: Urethra was normal, without stricture or lesions.  Prostate was nonobstructive.  Urothelium of the bladder was normal.  Ureteral orifice ease were normal in configuration and location.  There was 1 tiny stone fragment approximately 1 x 2 mm, layering in the posterior bladder.  Retrograde study of the left ureter revealed no significant stricture, but mild narrowing of the mid ureter at approximately L4.  There was moderate pyelocaliectasis.  I did not see any specific filling defects.  Description of procedure: The patient was properly identified and marked in the holding area.  He was seen to the operating room where general anesthetic was administered with the LMA.  He was placed in the dorsolithotomy position, genitalia and perineum were prepped, draped, proper timeout performed.  He did receive IV antibiotics.  21 French panendoscope advanced under direct vision into the bladder.  The  small stone was seen, felt likely to pass as he has nonobstructive prostate.  Retrograde study of the left ureter was performed using an open-ended catheter and Omnipaque with the above-mentioned findings.  Guidewire advanced through the open-ended catheter into the upper pole calyceal system using fluoroscopic guidance.  Cystoscope and catheter were removed.  I then passed a short dual-lumen 6 French ureteroscope easily up to the UPJ.  No stones were seen.  There was very mild stricture like area in the mid ureter, easily traversed with the scope.  No ureteral calculi were seen.  At this point, the semirigid scope was removed.  I placed a medium length 11/13 ureteral access catheter, first using the obturator and then the entire assembly.  There was no significant difficulty passing this up the ureter.  A safety wire was left in.  I then passed the flexible ureteroscope easily up the ureter.  No stones were identified.  I then sequentially inspected the calyceal system from upper pole calyces to lower pole.  The calyces were significantly dilated.  Multiple Randall's plaques were seen.  There was one 2 to 3 mm stone in the upper pole calyceal system which was grasped with the engage basket and removed.  I did my best to not the Randall's plaques off the papillae, this was not successful.  After negotiating the beak of the scope to the lower pole calyces, several small stones were seen.  These were grasped and extracted.  All significant stone matter was removed.  After systematic inspection of all calyces again, there being no stone material, the scope was removed.  The access catheter was removed and the guidewire backloaded through the scope.  26 cm 6 [place] contour double-J stent with a tether  left on was placed.  Excellent proximal distal curls were seen after the guidewire was removed.  The bladder was drained.  The scope was carefully removed, bringing the tether out the urethra.  The tether was tied just  outside the urethrall meatus, trimmed, and taped to the penis.  At this point, the procedure was terminated.  The patient was awakened, taken to the PACU in stable condition, having tolerated procedure well.

## 2021-11-04 NOTE — H&P (Signed)
H&P  Chief Complaint: Left ureteral calculus  History of Present Illness: CC/HPI: Left flank pain   The patient is a 30 year old male with an extensive history of kidney stones requiring multiple surgeries. Currently followed by Dr. Junious Silk. He presents today with a 48-hour history of sharp, intermittent, nonradiating left-sided flank pain associated with nausea. He denies fever/chills, dysuria, hematuria or emesis. He was seen in the emergency department at [facility] on 10/15/2021. CT stone study at that time revealed bilateral nonobstructing renal stones with chronic left-sided hydronephrosis.    Patient was in the office on Monday where he underwent KUB that showed a 3 to 4 mm calcification along the expected course of the left ureter. Renal ultrasound shows left-sided hydronephrosis which is a chronic process and him, but could be secondary to an obstructing stone.  He was going to be set up for ureteroscopic intervention but developed severe left-sided flank pain and presented to the hospital.  Pain was refractory to pain medication in the emergency department.  He denies any fever, chill, nausea, vomiting.  He is required multiple interventions in the past.  Past Medical History:  Diagnosis Date   Complication of anesthesia    Dysrhythmia    hx of SVT ablation in 2019 - DR Cristopher Peru dismissed by MD    Frequency-urgency syndrome    History of kidney stones    Hx of nausea and vomiting    d/t kidney stone   Hx of sepsis 08/11/2017   due to kidney stone/ hydronephrosis   Left ureteral stone    Migraine    hx of migraines    Pain due to ureteral stent (HCC)    PONV (postoperative nausea and vomiting)    none recently   Scoliosis of lumbar spine 1994   treated at [facility] until age 59   Wears glasses    reading only   Past Surgical History:  Procedure Laterality Date   CYSTOSCOPY W/ RETROGRADES Right 07/06/2015   Procedure: CYSTOSCOPY WITH RETROGRADE PYELOGRAM;  Surgeon: Festus Aloe, MD;  Location: [facility];  Service: Urology;  Laterality: Right;   CYSTOSCOPY W/ URETERAL STENT PLACEMENT Left 08/08/2015   Procedure: CYSTOSCOPY WITH STENT REPLACEMENT;  Surgeon: Festus Aloe, MD;  Location: [facility];  Service: Urology;  Laterality: Left;   CYSTOSCOPY W/ URETERAL STENT PLACEMENT Left 02/21/2017   Procedure: CYSTOSCOPY WITH RETROGRADE PYELOGRAM/URETERAL LEFT STENT PLACEMENT WITH  LASER;  Surgeon: Irine Seal, MD;  Location: [facility];  Service: Urology;  Laterality: Left;   CYSTOSCOPY W/ URETERAL STENT PLACEMENT Left 08/10/2017   Procedure: CYSTOSCOPY WITH RETROGRADE PYELOGRAM/URETERAL STENT PLACEMENT;  Surgeon: Raynelle Bring, MD;  Location[facility];  Service: Urology;  Laterality: Left;   CYSTOSCOPY W/ URETERAL STENT PLACEMENT Bilateral 04/19/2021   Procedure: CYSTOSCOPY WITH RETROGRADE PYELOGRAM/URETERAL STENT PLACEMENT;  Surgeon: Alexis Frock, MD;  Location[facility];  Service: Urology;  Laterality: Bilateral;   CYSTOSCOPY W/ URETERAL STENT PLACEMENT Left 07/11/2021   Procedure: CYSTOSCOPY WITH RETROGRADE PYELOGRAM/URETEROSCOPY/URETERAL STENT PLACEMENT;  Surgeon: Franchot Gallo, MD;  Location[facility];  Service: Urology;  Laterality: Left;   CYSTOSCOPY WITH RETROGRADE PYELOGRAM, URETEROSCOPY AND STENT PLACEMENT Left 06/24/2014   Procedure: CYSTOSCOPY WITH RETROGRADE PYELOGRAM,  AND STENT PLACEMENT;  Surgeon: Sharyn Creamer, MD;  Location[facility];  Service: Urology;  Laterality: Left;   CYSTOSCOPY WITH RETROGRADE PYELOGRAM, URETEROSCOPY AND STENT PLACEMENT Left 07/05/2014   Procedure: CYSTO/LEFT URETEROSCOPY/LEFT RETROGRADE PYELOGRAM/LEFT STENT PLACEMENT;  Surgeon: Festus Aloe, MD;  Location[facility];  Service: Urology;  Laterality: Left;   CYSTOSCOPY WITH RETROGRADE PYELOGRAM, URETEROSCOPY AND STENT PLACEMENT Left 07/06/2015   Procedure: CYSTOSCOPY WITH RETROGRADE PYELOGRAM, URETEROSCOPY , LASER, STENT PLACEMENT and BASKET  EXTRACTION;  Surgeon: Festus Aloe, MD;  [facility];  Service: Urology;  Laterality: Left;   CYSTOSCOPY WITH RETROGRADE PYELOGRAM, URETEROSCOPY AND STENT PLACEMENT Left 08/19/2015   Procedure: CYSTOSCOPY WITH RETROGRADE PYELOGRAM, URETEROSCOPY AND STENT PLACEMENT;  Surgeon: Festus Aloe, MD;  [facility];  Service: Urology;  Laterality: Left;   CYSTOSCOPY WITH RETROGRADE PYELOGRAM, URETEROSCOPY AND STENT PLACEMENT Left 03/13/2018   Procedure: CYSTOSCOPY WITH RETROGRADE PYELOGRAM, URETEROSCOPY AND LEFT STENT PLACEMENT;  Surgeon: Irine Seal, MD;  [facility];  Service: Urology;  Laterality: Left;   CYSTOSCOPY WITH RETROGRADE PYELOGRAM, URETEROSCOPY AND STENT PLACEMENT Left 03/11/2021   Procedure: CYSTOSCOPY WITH RETROGRADE PYELOGRAM, URETEROSCOPY AND STENT PLACEMENT,STONE [place];  Surgeon: Franchot Gallo, MD;  [facility];  Service: Urology;  Laterality: Left;   CYSTOSCOPY WITH RETROGRADE PYELOGRAM, URETEROSCOPY AND STENT PLACEMENT Bilateral 05/06/2021   Procedure: CYSTOSCOPY WITH RETROGRADE PYELOGRAM, URETEROSCOPY AND STENT REPLACEMENT;  Surgeon: Alexis Frock, MD;  Location[facility];  Service: Urology;  Laterality: Bilateral;  90 MINS   CYSTOSCOPY WITH URETEROSCOPY AND STENT PLACEMENT Left 01/16/2016   Procedure: CYSTOSCOPY WITH LEFT RETROGRADE PYELOGRAM  LEFT DIGITAL URETEROSCOPY AND PLACEMENT LEFT URETERAL STENT;  Surgeon: Festus Aloe, MD;  Location[facility];  Service: Urology;  Laterality: Left;   CYSTOSCOPY WITH URETEROSCOPY, STONE BASKETRY AND STENT PLACEMENT Left 02/13/2016   Procedure: CYSTOSCOPY WITH LEFT URETEROSCOPY, HOLMIUM LASER AND STENT PLACEMENT;  Surgeon: Festus Aloe, MD;  [facility];  Service: Urology;  Laterality: Left;   CYSTOSCOPY/RETROGRADE/URETEROSCOPY/STONE EXTRACTION WITH BASKET Left 08/08/2015   Procedure: CYSTOSCOPY/RETROGRADE/URETEROSCOPY/STONE EXTRACTION WITH BASKET;  Surgeon: Festus Aloe, MD;   Location: [facility];  Service: Urology;  Laterality: Left;   CYSTOSCOPY/URETEROSCOPY/HOLMIUM LASER/STENT PLACEMENT Left 07/30/2016   Procedure: CYSTOSCOPY/URETEROSCOPY/HOLMIUM LASER/STENT PLACEMENT;  Surgeon: Festus Aloe, MD;  Location: [facility];  Service: Urology;  Laterality: Left;   CYSTOSCOPY/URETEROSCOPY/HOLMIUM LASER/STENT PLACEMENT Left 08/19/2017   Procedure: CYSTOSCOPY/URETEROSCOPY/HOLMIUM LASER/STENT PLACEMENT;  Surgeon: Festus Aloe, MD;  Location: [facility];  Service: Urology;  Laterality: Left;   CYSTOSCOPY/URETEROSCOPY/HOLMIUM LASER/STENT PLACEMENT N/A 10/13/2018   Procedure: CYSTOSCOPY/RETROGRADE RIGHT URETEROSCOPY/ AND RIGHTSTENT PLACEMENT;  Surgeon: Irine Seal, MD;  Location[facility];  Service: Urology;  Laterality: N/A;   EXTRACORPOREAL SHOCK WAVE LITHOTRIPSY Left 07-07-2015  &  12-26-2014   FOOT CAPSULE RELEASE W/ PERCUTANEOUS HEEL CORD LENGTHENING, TIBIAL TENDON TRANSFER Left 1994   clubfoot   HOLMIUM LASER APPLICATION Left [id number]   Procedure: LASER LITHO;  Surgeon: Festus Aloe, MD;  Location: [facility];  Service: Urology;  Laterality: Left;   HOLMIUM LASER APPLICATION Left [id number]   Procedure: HOLMIUM LASER  WITH LITHOTRIPSY ;  Surgeon: Festus Aloe, MD;  Location: [facility];  Service: Urology;  Laterality: Left;   HOLMIUM LASER APPLICATION Left [id number]   Procedure: HOLMIUM LASER APPLICATION;  Surgeon: Irine Seal, MD;  Location[facility];  Service: Urology;  Laterality: Left;   HOLMIUM LASER APPLICATION Left [id number]   Procedure: HOLMIUM LASER APPLICATION;  Surgeon: Irine Seal, MD;  Location[facility];  Service: Urology;  Laterality: Left;   HOLMIUM LASER APPLICATION Bilateral [id number]   Procedure: HOLMIUM LASER APPLICATION;  Surgeon: Alexis Frock, MD;  Location[facility];  Service: Urology;  Laterality: Bilateral;   lumpectomy in neck for cat scratch fever  surgery for club foot at few days old   LYMPH GLAND EXCISION  2003   neck--  benign   SVT ABLATION N/A 05/22/2018   Procedure: SVT ABLATION;  Surgeon: Evans Lance, MD;  Location: [facility];  Service: Cardiovascular;  Laterality: N/A;   SVT ABLATION     2019    URETEROSCOPY WITH HOLMIUM LASER LITHOTRIPSY Left 08/04/2021   Procedure: URETEROSCOPY WITH basket removal of stones/ STENT exchange/retrograde;  Surgeon: Festus Aloe, MD;  Location: [facility];  Service: Urology;  Laterality: Left;  ONLY NEEDS 60 MIN    Home Medications:  (Not in a hospital admission)  Allergies:  Allergies  Allergen Reactions   Bactrim [Sulfamethoxazole-Trimethoprim] Nausea And Vomiting   Ketamine Other (See Comments)    Did not like the sensation    Family History  Problem Relation Age of Onset   Cancer Father    Kidney Stones Mother    Cancer Other    Hypertension Other    Hyperlipidemia Other    Stroke Other    Kidney Stones Brother    Social History:  reports that he quit smoking about 7 years ago. His smoking use included cigarettes. He has never used smokeless tobacco. He reports that he does not currently use alcohol. He reports that he does not use drugs.  ROS: A complete review of systems was performed.  All systems are negative except for pertinent findings as noted. ROS   Physical Exam:  Vital signs in last 24 hours: Temp:  [98.3 F (36.8 C)] 98.3 F (36.8 C) (11/16 0439) Pulse Rate:  [71-115] 83 (11/16 1215) Resp:  [10-18] 17 (11/16 1215) BP: (113-155)/(80-90) 113/80 (11/16 0900) SpO2:  [97 %-100 %] 100 % (11/16 1215) Weight:  [97.5 kg] 97.5 kg (11/16 0439) General:  Alert and oriented, No acute distress HEENT: Normocephalic, atraumatic Neck: No JVD or lymphadenopathy Cardiovascular: Regular rate and rhythm Lungs: Regular rate and effort Abdomen: Soft, nontender, nondistended, no abdominal masses Back: No CVA tenderness Extremities: No  edema Neurologic: Grossly intact  Laboratory Data:  Results for orders placed or performed during the hospital encounter of 11/04/21 (from the past 24 hour(s))  Basic metabolic panel     Status: Abnormal   Collection Time: 11/04/21  5:02 AM  Result Value Ref Range   Sodium 138 135 - 145 mmol/L   Potassium 3.4 (L) 3.5 - 5.1 mmol/L   Chloride 103 98 - 111 mmol/L   CO2 25 22 - 32 mmol/L   Glucose, Bld 116 (H) 70 - 99 mg/dL   BUN 24 (H) 6 - 20 mg/dL   Creatinine, Ser 1.15 0.61 - 1.24 mg/dL   Calcium 9.5 8.9 - 10.3 mg/dL   GFR, Estimated >60 >60 mL/min   Anion gap 10 5 - 15  CBC with Differential/Platelet     Status: None   Collection Time: 11/04/21  5:02 AM  Result Value Ref Range   WBC 7.9 4.0 - 10.5 K/uL   RBC 4.82 4.22 - 5.81 MIL/uL   Hemoglobin 14.2 13.0 - 17.0 g/dL   HCT 41.1 39.0 - 52.0 %   MCV 85.3 80.0 - 100.0 fL   MCH 29.5 26.0 - 34.0 pg   MCHC 34.5 30.0 - 36.0 g/dL   RDW 12.9 11.5 - 15.5 %   Platelets 151 150 - 400 K/uL   nRBC 0.0 0.0 - 0.2 %   Neutrophils Relative % 59 %   Neutro Abs 4.6 1.7 - 7.7 K/uL  Lymphocytes Relative 30 %   Lymphs Abs 2.4 0.7 - 4.0 K/uL   Monocytes Relative 10 %   Monocytes Absolute 0.8 0.1 - 1.0 K/uL   Eosinophils Relative 1 %   Eosinophils Absolute 0.1 0.0 - 0.5 K/uL   Basophils Relative 0 %   Basophils Absolute 0.0 0.0 - 0.1 K/uL   Immature Granulocytes 0 %   Abs Immature Granulocytes 0.03 0.00 - 0.07 K/uL  Urinalysis, Routine w reflex microscopic Urine, Clean Catch     Status: None   Collection Time: 11/04/21  5:02 AM  Result Value Ref Range   Color, Urine YELLOW YELLOW   APPearance CLEAR CLEAR   Specific Gravity, Urine 1.023 1.005 - 1.030   pH 5.0 5.0 - 8.0   Glucose, UA NEGATIVE NEGATIVE mg/dL   Hgb urine dipstick NEGATIVE NEGATIVE   Bilirubin Urine NEGATIVE NEGATIVE   Ketones, ur NEGATIVE NEGATIVE mg/dL   Protein, ur NEGATIVE NEGATIVE mg/dL   Nitrite NEGATIVE NEGATIVE   Leukocytes,Ua NEGATIVE NEGATIVE  Resp Panel by  RT-PCR (Flu A&B, Covid) Nasopharyngeal Swab     Status: None   Collection Time: 11/04/21  5:33 AM   Specimen: Nasopharyngeal Swab; Nasopharyngeal(NP) swabs in vial transport medium  Result Value Ref Range   SARS Coronavirus 2 by RT PCR NEGATIVE NEGATIVE   Influenza A by PCR NEGATIVE NEGATIVE   Influenza B by PCR NEGATIVE NEGATIVE  HIV Antibody (routine testing w rflx)     Status: None   Collection Time: 11/04/21  9:55 AM  Result Value Ref Range   HIV Screen 4th Generation wRfx Non Reactive Non Reactive   Recent Results (from the past 240 hour(s))  Resp Panel by RT-PCR (Flu A&B, Covid) Nasopharyngeal Swab     Status: None   Collection Time: 11/04/21  5:33 AM   Specimen: Nasopharyngeal Swab; Nasopharyngeal(NP) swabs in vial transport medium  Result Value Ref Range Status   SARS Coronavirus 2 by RT PCR NEGATIVE NEGATIVE Final    Comment: (NOTE) SARS-CoV-2 target nucleic acids are NOT DETECTED.  The SARS-CoV-2 RNA is generally detectable in upper respiratory specimens during the acute phase of infection. The lowest concentration of SARS-CoV-2 viral copies this assay can detect is 138 copies/mL. A negative result does not preclude SARS-Cov-2 infection and should not be used as the sole basis for treatment or other patient management decisions. A negative result may occur with  improper specimen collection/handling, submission of specimen other than nasopharyngeal swab, presence of viral mutation(s) within the areas targeted by this assay, and inadequate number of viral copies(<138 copies/mL). A negative result must be combined with clinical observations, patient history, and epidemiological information. The expected result is Negative.  Fact Sheet for Patients:  EntrepreneurPulse.com.au  Fact Sheet for Healthcare Providers:  IncredibleEmployment.be  This test is no t yet approved or cleared by the [place] FDA and  has been authorized for  detection and/or diagnosis of SARS-CoV-2 by FDA under an Emergency Use Authorization (EUA). This EUA will remain  in effect (meaning this test can be used) for the duration of the COVID-19 declaration under Section 564(b)(1) of the Act, 21 U.S.C.section 360bbb-3(b)(1), unless the authorization is terminated  or revoked sooner.       Influenza A by PCR NEGATIVE NEGATIVE Final   Influenza B by PCR NEGATIVE NEGATIVE Final    Comment: (NOTE) The Xpert Xpress SARS-CoV-2/FLU/RSV plus assay is intended as an aid in the diagnosis of influenza from Nasopharyngeal swab specimens and should not be used as  a sole basis for treatment. Nasal washings and aspirates are unacceptable for Xpert Xpress SARS-CoV-2/FLU/RSV testing.  Fact Sheet for Patients: BloggerCourse.com  Fact Sheet for Healthcare Providers: SeriousBroker.it  This test is not yet approved or cleared by the [place] FDA and has been authorized for detection and/or diagnosis of SARS-CoV-2 by FDA under an Emergency Use Authorization (EUA). This EUA will remain in effect (meaning this test can be used) for the duration of the COVID-19 declaration under Section 564(b)(1) of the Act, 21 U.S.C. section 360bbb-3(b)(1), unless the authorization is terminated or revoked.  Performed at [facility], [address] [place], [place] [address]    Creatinine: Recent Labs    11/04/21 0502  CREATININE 1.15    Impression/Assessment:  Left ureteral calculus History of left ureteral stricture  Plan:  Plan for cystoscopy with left ureteral stent placement.  Risk and benefits discussed.  Ray Church, III 11/04/2021, 12:53 PM

## 2021-11-04 NOTE — Transfer of Care (Signed)
Immediate Anesthesia Transfer of Care Note  Patient: Thomas Howell  Procedure(s) Performed: CYSTOSCOPY WITH RETROGRADE PYELOGRAM//URETEROSCOPY/STONE EXTRACTION/URETERAL STENT PLACEMENT (Left)  Patient Location: PACU  Anesthesia Type:General  Level of Consciousness: drowsy  Airway & Oxygen Therapy: Patient Spontanous Breathing and Patient connected to face mask  Post-op Assessment: Report given to RN and Post -op Vital signs reviewed and stable  Post vital signs: Reviewed and stable  Last Vitals:  Vitals Value Taken Time  BP 148/76 11/04/21 1717  Temp 36.4 C 11/04/21 1717  Pulse 87 11/04/21 1723  Resp 12 11/04/21 1723  SpO2 100 % 11/04/21 1723  Vitals shown include unvalidated device data.  Last Pain:  Vitals:   11/04/21 1717  TempSrc:   PainSc: Asleep      Patients Stated Pain Goal: 3 (11/04/21 0723)  Complications: No notable events documented.

## 2021-11-04 NOTE — ED Provider Notes (Signed)
[facility]-EMERGENCY DEPT Provider Note   CSN: [id number] Arrival date & time: 11/04/21  0425     History Chief Complaint  Patient presents with   Nephrolithiasis    Thomas Howell is a 30 y.o. male.  The history is provided by the patient.  Flank Pain This is a recurrent problem. The current episode started more than 2 days ago. The problem occurs daily. The problem has been gradually worsening. Associated symptoms include abdominal pain. Pertinent negatives include no chest pain and no shortness of breath. Exacerbated by: Palpation and movement. Nothing relieves the symptoms.  Patient presents with flank pain. He reports has been having pain since last week.  He reports he was seen by her urologist earlier this week and he was planned to have scheduled surgery.  However the pain was too unbearable at home so he came to the ER.    Past Medical History:  Diagnosis Date   Complication of anesthesia    Dysrhythmia    hx of SVT ablation in 2019 - DR Lewayne Bunting dismissed by MD    Frequency-urgency syndrome    History of kidney stones    Hx of nausea and vomiting    d/t kidney stone   Hx of sepsis 08/11/2017   due to kidney stone/ hydronephrosis   Left ureteral stone    Migraine    hx of migraines    Pain due to ureteral stent (HCC)    PONV (postoperative nausea and vomiting)    none recently   Scoliosis of lumbar spine 1994   treated at [facility] until age 22   Wears glasses    reading only    Patient Active Problem List   Diagnosis Date Noted   Anxiety 06/27/2021   Right ureteral stone 10/13/2018   Acute pyelonephritis 08/14/2018   Bilateral hydronephrosis 08/14/2018   Sepsis (HCC) 08/14/2018   SVT (supraventricular tachycardia) (HCC) 05/22/2018   Attention deficit hyperactivity disorder (ADHD), combined type 04/08/2018   Chronic migraine 02/14/2017   Neck pain 02/04/2016   Hydronephrosis, left 08/19/2015   Hydronephrosis determined  by ultrasound    Kidney stone on left side 08/18/2015   Nephrolithiasis 08/18/2015   Kidney stone 07/06/2015   Left ureteral stone 07/06/2015   Analgesic rebound headache 06/05/2014   Migraine headache without aura 05/30/2014    Past Surgical History:  Procedure Laterality Date   CYSTOSCOPY W/ RETROGRADES Right 07/06/2015   Procedure: CYSTOSCOPY WITH RETROGRADE PYELOGRAM;  Surgeon: Jerilee Field, MD;  Location: [facility];  Service: Urology;  Laterality: Right;   CYSTOSCOPY W/ URETERAL STENT PLACEMENT Left 08/08/2015   Procedure: CYSTOSCOPY WITH STENT REPLACEMENT;  Surgeon: Jerilee Field, MD;  Location[facility];  Service: Urology;  Laterality: Left;   CYSTOSCOPY W/ URETERAL STENT PLACEMENT Left 02/21/2017   Procedure: CYSTOSCOPY WITH RETROGRADE PYELOGRAM/URETERAL LEFT STENT PLACEMENT WITH  LASER;  Surgeon: Bjorn Pippin, MD;  Location: [facility];  Service: Urology;  Laterality: Left;   CYSTOSCOPY W/ URETERAL STENT PLACEMENT Left 08/10/2017   Procedure: CYSTOSCOPY WITH RETROGRADE PYELOGRAM/URETERAL STENT PLACEMENT;  Surgeon: Heloise Purpura, MD;  Location: [facility];  Service: Urology;  Laterality: Left;   CYSTOSCOPY W/ URETERAL STENT PLACEMENT Bilateral 04/19/2021   Procedure: CYSTOSCOPY WITH RETROGRADE PYELOGRAM/URETERAL STENT PLACEMENT;  Surgeon: Sebastian Ache, MD;  Location: [facility];  Service: Urology;  Laterality: Bilateral;   CYSTOSCOPY W/ URETERAL STENT PLACEMENT Left 07/11/2021   Procedure: CYSTOSCOPY WITH RETROGRADE PYELOGRAM/URETEROSCOPY/URETERAL STENT PLACEMENT;  Surgeon: Marcine Matar, MD;  Location: [facility];  Service: Urology;  Laterality: Left;   CYSTOSCOPY WITH RETROGRADE PYELOGRAM, URETEROSCOPY AND STENT PLACEMENT Left 06/24/2014   Procedure: CYSTOSCOPY WITH RETROGRADE PYELOGRAM,  AND STENT PLACEMENT;  Surgeon: Magdalene Molly, MD;  Location: [facility];  Service: Urology;  Laterality: Left;   CYSTOSCOPY WITH RETROGRADE PYELOGRAM, URETEROSCOPY AND STENT PLACEMENT  Left 07/05/2014   Procedure: CYSTO/LEFT URETEROSCOPY/LEFT RETROGRADE PYELOGRAM/LEFT STENT PLACEMENT;  Surgeon: Jerilee Field, MD;  Location: [facility];  Service: Urology;  Laterality: Left;   CYSTOSCOPY WITH RETROGRADE PYELOGRAM, URETEROSCOPY AND STENT PLACEMENT Left 07/06/2015   Procedure: CYSTOSCOPY WITH RETROGRADE PYELOGRAM, URETEROSCOPY , LASER, STENT PLACEMENT and BASKET EXTRACTION;  Surgeon: Jerilee Field, MD;  Location: [facility];  Service: Urology;  Laterality: Left;   CYSTOSCOPY WITH RETROGRADE PYELOGRAM, URETEROSCOPY AND STENT PLACEMENT Left 08/19/2015   Procedure: CYSTOSCOPY WITH RETROGRADE PYELOGRAM, URETEROSCOPY AND STENT PLACEMENT;  Surgeon: Jerilee Field, MD;  Location: [facility];  Service: Urology;  Laterality: Left;   CYSTOSCOPY WITH RETROGRADE PYELOGRAM, URETEROSCOPY AND STENT PLACEMENT Left 03/13/2018   Procedure: CYSTOSCOPY WITH RETROGRADE PYELOGRAM, URETEROSCOPY AND LEFT STENT PLACEMENT;  Surgeon: Bjorn Pippin, MD;  Location: [facility];  Service: Urology;  Laterality: Left;   CYSTOSCOPY WITH RETROGRADE PYELOGRAM, URETEROSCOPY AND STENT PLACEMENT Left 03/11/2021   Procedure: CYSTOSCOPY WITH RETROGRADE PYELOGRAM, URETEROSCOPY AND STENT PLACEMENT,STONE EXTRACTION,HOLMIUM LASER;  Surgeon: Marcine Matar, MD;  Location: [facility];  Service: Urology;  Laterality: Left;   CYSTOSCOPY WITH RETROGRADE PYELOGRAM, URETEROSCOPY AND STENT PLACEMENT Bilateral 05/06/2021   Procedure: CYSTOSCOPY WITH RETROGRADE PYELOGRAM, URETEROSCOPY AND STENT REPLACEMENT;  Surgeon: Sebastian Ache, MD;  Location: [facility];  Service: Urology;  Laterality: Bilateral;  90 MINS   CYSTOSCOPY WITH URETEROSCOPY AND STENT PLACEMENT Left 01/16/2016   Procedure: CYSTOSCOPY WITH LEFT RETROGRADE PYELOGRAM  LEFT DIGITAL URETEROSCOPY AND PLACEMENT LEFT URETERAL STENT;  Surgeon: Jerilee Field, MD;  Location: [facility];  Service: Urology;  Laterality: Left;   CYSTOSCOPY WITH URETEROSCOPY, STONE BASKETRY AND STENT  PLACEMENT Left 02/13/2016   Procedure: CYSTOSCOPY WITH LEFT URETEROSCOPY, HOLMIUM LASER AND STENT PLACEMENT;  Surgeon: Jerilee Field, MD;  Location: [facility];  Service: Urology;  Laterality: Left;   CYSTOSCOPY/RETROGRADE/URETEROSCOPY/STONE EXTRACTION WITH BASKET Left 08/08/2015   Procedure: CYSTOSCOPY/RETROGRADE/URETEROSCOPY/STONE EXTRACTION WITH BASKET;  Surgeon: Jerilee Field, MD;  [facility];  Service: Urology;  Laterality: Left;   CYSTOSCOPY/URETEROSCOPY/HOLMIUM LASER/STENT PLACEMENT Left 07/30/2016   Procedure: CYSTOSCOPY/URETEROSCOPY/HOLMIUM LASER/STENT PLACEMENT;  Surgeon: Jerilee Field, MD;  Location: [facility];  Service: Urology;  Laterality: Left;   CYSTOSCOPY/URETEROSCOPY/HOLMIUM LASER/STENT PLACEMENT Left 08/19/2017   Procedure: CYSTOSCOPY/URETEROSCOPY/HOLMIUM LASER/STENT PLACEMENT;  Surgeon: Jerilee Field, MD;  Location: [facility];  Service: Urology;  Laterality: Left;   CYSTOSCOPY/URETEROSCOPY/HOLMIUM LASER/STENT PLACEMENT N/A 10/13/2018   Procedure: CYSTOSCOPY/RETROGRADE RIGHT URETEROSCOPY/ AND RIGHTSTENT PLACEMENT;  Surgeon: Bjorn Pippin, MD;  Location: [facility];  Service: Urology;  Laterality: N/A;   EXTRACORPOREAL SHOCK WAVE LITHOTRIPSY Left 07-07-2015  &  12-26-2014   FOOT CAPSULE RELEASE W/ PERCUTANEOUS HEEL CORD LENGTHENING, TIBIAL TENDON TRANSFER Left 1994   clubfoot   HOLMIUM LASER APPLICATION Left 07/05/2014   Procedure: LASER LITHO;  Surgeon: Jerilee Field, MD;  Location: [facility];  Service: Urology;  Laterality: Left;   HOLMIUM LASER APPLICATION Left 08/08/2015   Procedure: HOLMIUM LASER  WITH LITHOTRIPSY ;  Surgeon: Jerilee Field, MD;  Location: [facility];  Service: Urology;  Laterality: Left;   HOLMIUM LASER APPLICATION Left 02/21/2017   Procedure: HOLMIUM LASER APPLICATION;  Surgeon: Jonny Ruiz  Annabell Howells, MD;  Location: [facility];  Service: Urology;  Laterality: Left;    HOLMIUM LASER APPLICATION Left 03/13/2018   Procedure: HOLMIUM LASER APPLICATION;  Surgeon: Bjorn Pippin, MD;  Location: [facility];  Service: Urology;  Laterality: Left;   HOLMIUM LASER APPLICATION Bilateral 05/06/2021   Procedure: HOLMIUM LASER APPLICATION;  Surgeon: Sebastian Ache, MD;  Location: [facility];  Service: Urology;  Laterality: Bilateral;   lumpectomy in neck for cat scratch fever      surgery for club foot at few days old   LYMPH GLAND EXCISION  2003   neck--  benign   SVT ABLATION N/A 05/22/2018   Procedure: SVT ABLATION;  Surgeon: Marinus Maw, MD;  Location: [facility];  Service: Cardiovascular;  Laterality: N/A;   SVT ABLATION     2019    URETEROSCOPY WITH HOLMIUM LASER LITHOTRIPSY Left 08/04/2021   Procedure: URETEROSCOPY WITH basket removal of stones/ STENT exchange/retrograde;  Surgeon: Jerilee Field, MD;  Location: [facility];  Service: Urology;  Laterality: Left;  ONLY NEEDS 60 MIN       Family History  Problem Relation Age of Onset   Cancer Father    Kidney Stones Mother    Cancer Other    Hypertension Other    Hyperlipidemia Other    Stroke Other    Kidney Stones Brother     Social History   Tobacco Use   Smoking status: Former    Years: 2.00    Types: Cigarettes    Quit date: 07/29/2014    Years since quitting: 7.2   Smokeless tobacco: Never   Tobacco comments:    social smoker  Vaping Use   Vaping Use: Never used  Substance Use Topics   Alcohol use: Not Currently    Alcohol/week: 0.0 standard drinks   Drug use: No    Home Medications Prior to Admission medications   Medication Sig Start Date End Date Taking? Authorizing Provider  acetaminophen (TYLENOL) 500 MG tablet Take 1,000 mg by mouth every 8 (eight) hours as needed for headache or moderate pain.    [provider]  hydrochlorothiazide (HYDRODIURIL) 25 MG tablet Take 25 mg by mouth daily. For kidney stone prevention    [provider]   hydrOXYzine (ATARAX/VISTARIL) 10 MG tablet Take 1/2-1 tab po qd prn panic attacks and 1 tab po qhs prn sleep Patient taking differently: Take 10 mg by mouth daily as needed for anxiety. 06/26/21   Campbell Riches, NP  rizatriptan (MAXALT-MLT) 10 MG disintegrating tablet Take 1 tablet (10 mg total) by mouth daily as needed for migraine. May repeat in 2 hrs if needed; max 2 per 24 hrs 06/26/21   Campbell Riches, NP  sertraline (ZOLOFT) 50 MG tablet Take 1/2 tab po qd x 6 days then one po qd 11/02/21   Tommie Sams, DO  tamsulosin (FLOMAX) 0.4 MG CAPS capsule Take 1 capsule (0.4 mg total) by mouth daily. Patient taking differently: Take 0.4 mg by mouth daily after supper. 04/03/21   Fayrene Helper, PA-C    Allergies    Bactrim [sulfamethoxazole-trimethoprim] and Ketamine  Review of Systems   Review of Systems  Constitutional:  Negative for fever.  Respiratory:  Negative for cough and shortness of breath.   Cardiovascular:  Negative for chest pain.  Gastrointestinal:  Positive for abdominal pain, nausea and vomiting.  Genitourinary:  Positive for difficulty urinating and flank pain.  All other systems reviewed and are negative.  Physical  Exam Updated Vital Signs BP (!) 155/90 (BP Location: Right Arm)   Pulse (!) 115   Temp 98.3 F (36.8 C) (Oral)   Resp 17   Ht 1.727 m (5\' 8" )   Wt 97.5 kg   SpO2 99%   BMI 32.69 kg/m   Physical Exam CONSTITUTIONAL: Well developed/well nourished, uncomfortable. HEAD: Normocephalic/atraumatic EYES: EOMI/PERRL ENMT: Mucous membranes moist NECK: supple no meningeal signs SPINE/BACK:entire spine nontender CV: S1/S2 noted, no murmurs/rubs/gallops noted LUNGS: Lungs are clear to auscultation bilaterally, no apparent distress ABDOMEN: soft, nontender, no rebound or guarding, bowel sounds noted throughout abdomen GU: Left cva tenderness NEURO: Pt is awake/alert/appropriate, moves all extremitiesx4.  No facial droop.  He is ambulatory EXTREMITIES:  pulses normal/equal, full ROM SKIN: warm, color normal PSYCH: Anxious ED Results / Procedures / Treatments   Labs (all labs ordered are listed, but only abnormal results are displayed) Labs Reviewed  BASIC METABOLIC PANEL - Abnormal; Notable for the following components:      Result Value   Potassium 3.4 (*)    Glucose, Bld 116 (*)    BUN 24 (*)    All other components within normal limits  RESP PANEL BY RT-PCR (FLU A&B, COVID) ARPGX2  CBC WITH DIFFERENTIAL/PLATELET  URINALYSIS, ROUTINE W REFLEX MICROSCOPIC    EKG None  Radiology No results found.  Procedures Procedures   Medications Ordered in ED Medications  HYDROmorphone (DILAUDID) injection 1 mg (has no administration in time range)  sodium chloride 0.9 % bolus 1,000 mL (has no administration in time range)  ondansetron (ZOFRAN) injection 4 mg (has no administration in time range)  ondansetron (ZOFRAN) injection 4 mg (4 mg Intravenous Given 11/04/21 0524)  fentaNYL (SUBLIMAZE) injection 100 mcg (100 mcg Intravenous Given 11/04/21 0523)  ketorolac (TORADOL) 30 MG/ML injection 30 mg (30 mg Intravenous Given 11/04/21 11/06/21)    ED Course  I have reviewed the triage vital signs and the nursing notes.  Pertinent labs  results that were available during my care of the patient were reviewed by me and considered in my medical decision making (see chart for details).    MDM Rules/Calculators/A&P                           Patient w/long history of kidney stones presenting with left flank pain.  He was seen by urologist on November 14.  I was able to access imaging through the PACS system, and that reveals left ureteral stone with hydronephrosis. Plan to treat his pain, check labs and will consult urology  Discussed with Dr. November 16 with urology.  He request to give the patient Toradol and reassess.  6:46 AM Patient still writhing in pain, rocking back and forth in the chair. He reports pain is not improved with  fentanyl/Toradol Patient has been requesting Dilaudid I messaged Dr. Alvester Morin with urology.  He request to keep the patient n.p.o. and a member of his team will see the patient for likely stent placement Patient has been updated on plan Final Clinical Impression(s) / ED Diagnoses Final diagnoses:  Ureteral colic    Rx / DC Orders ED Discharge Orders     None        Alvester Morin, MD 11/04/21 [phone]

## 2021-11-04 NOTE — Anesthesia Postprocedure Evaluation (Signed)
Anesthesia Post Note  Patient: Thomas Howell  Procedure(s) Performed: CYSTOSCOPY WITH RETROGRADE PYELOGRAM//URETEROSCOPY/STONE EXTRACTION/URETERAL STENT PLACEMENT (Left)     Patient location during evaluation: PACU Anesthesia Type: General Level of consciousness: awake and alert Pain management: pain level controlled Vital Signs Assessment: post-procedure vital signs reviewed and stable Respiratory status: spontaneous breathing, nonlabored ventilation and respiratory function stable Cardiovascular status: stable and blood pressure returned to baseline Anesthetic complications: no   No notable events documented.  Last Vitals:  Vitals:   11/04/21 1800 11/04/21 1815  BP: 128/87 (!) 141/99  Pulse: 84 85  Resp: 15 18  Temp: (!) 36.4 C (!) 36.4 C  SpO2: 96% 98%    Last Pain:  Vitals:   11/04/21 1815  TempSrc:   PainSc: 0-No pain                 Beryle Lathe

## 2021-11-04 NOTE — ED Triage Notes (Signed)
Pt has had a kidney stone since last Wednesday, pt was to be scheduled for surgery by the scheduler today. Pt was told to come to the ER if his pain was unbearable.

## 2021-11-04 NOTE — Discharge Instructions (Signed)
You may see some blood in the urine and may have some burning with urination for 48-72 hours. You also may notice that you have to urinate more frequently or urgently after your procedure which is normal.  You should call should you develop an inability urinate, fever > 101, persistent nausea and vomiting that prevents you from eating or drinking to stay hydrated.  If you have a stent, you will likely urinate more frequently and urgently until the stent is removed and you may experience some discomfort/pain in the lower abdomen and flank especially when urinating. You may take pain medication prescribed to you if needed for pain. You may also intermittently have blood in the urine until the stent is removed.  It is okay to pull the stent out on Monday morning.

## 2021-11-04 NOTE — H&P (Signed)
H&P  Chief Complaint: Kidney stone  History of Present Illness: 30 year old male with longstanding history of urolithiasis, presenting with left mid ureteral stone 3 to 4 mm in size and intractable pain.  we are proceeding with cystoscopy, retrograde, possible ureteroscopy, possible dilation of ureteral stricture, double-J stent placement.  Past Medical History:  Diagnosis Date   Complication of anesthesia    Dysrhythmia    hx of SVT ablation in 2019 - DR Lewayne Bunting dismissed by MD    Frequency-urgency syndrome    History of kidney stones    Hx of nausea and vomiting    d/t kidney stone   Hx of sepsis 08/11/2017   due to kidney stone/ hydronephrosis   Left ureteral stone    Migraine    hx of migraines    Pain due to ureteral stent (HCC)    PONV (postoperative nausea and vomiting)    none recently   Scoliosis of lumbar spine 1994   treated at [facility] until age 72   Wears glasses    reading only    Past Surgical History:  Procedure Laterality Date   CYSTOSCOPY W/ RETROGRADES Right 07/06/2015   Procedure: CYSTOSCOPY WITH RETROGRADE PYELOGRAM;  Surgeon: Jerilee Field, MD;  Location: [facility];  Service: Urology;  Laterality: Right;   CYSTOSCOPY W/ URETERAL STENT PLACEMENT Left 08/08/2015   Procedure: CYSTOSCOPY WITH STENT REPLACEMENT;  Surgeon: Jerilee Field, MD;  Location: [facility];  Service: Urology;  Laterality: Left;   CYSTOSCOPY W/ URETERAL STENT PLACEMENT Left 02/21/2017   Procedure: CYSTOSCOPY WITH RETROGRADE PYELOGRAM/URETERAL LEFT STENT PLACEMENT WITH  LASER;  Surgeon: Bjorn Pippin, MD;  Location: [facility];  Service: Urology;  Laterality: Left;   CYSTOSCOPY W/ URETERAL STENT PLACEMENT Left 08/10/2017   Procedure: CYSTOSCOPY WITH RETROGRADE PYELOGRAM/URETERAL STENT PLACEMENT;  Surgeon: Heloise Purpura, MD;  Location: [facility];  Service: Urology;  Laterality: Left;   CYSTOSCOPY W/ URETERAL STENT PLACEMENT Bilateral 04/19/2021   Procedure: CYSTOSCOPY WITH  RETROGRADE PYELOGRAM/URETERAL STENT PLACEMENT;  Surgeon: Sebastian Ache, MD;  Location: [facility];  Service: Urology;  Laterality: Bilateral;   CYSTOSCOPY W/ URETERAL STENT PLACEMENT Left 07/11/2021   Procedure: CYSTOSCOPY WITH RETROGRADE PYELOGRAM/URETEROSCOPY/URETERAL STENT PLACEMENT;  Surgeon: Marcine Matar, MD;  Location: [facility];  Service: Urology;  Laterality: Left;   CYSTOSCOPY WITH RETROGRADE PYELOGRAM, URETEROSCOPY AND STENT PLACEMENT Left 06/24/2014   Procedure: CYSTOSCOPY WITH RETROGRADE PYELOGRAM,  AND STENT PLACEMENT;  Surgeon: Magdalene Molly, MD;  Location: [facility];  Service: Urology;  Laterality: Left;   CYSTOSCOPY WITH RETROGRADE PYELOGRAM, URETEROSCOPY AND STENT PLACEMENT Left 07/05/2014   Procedure: CYSTO/LEFT URETEROSCOPY/LEFT RETROGRADE PYELOGRAM/LEFT STENT PLACEMENT;  Surgeon: Jerilee Field, MD;  Location: [facility];  Service: Urology;  Laterality: Left;   CYSTOSCOPY WITH RETROGRADE PYELOGRAM, URETEROSCOPY AND STENT PLACEMENT Left 07/06/2015   Procedure: CYSTOSCOPY WITH RETROGRADE PYELOGRAM, URETEROSCOPY , LASER, STENT PLACEMENT and BASKET EXTRACTION;  Surgeon: Jerilee Field, MD;  Location: [facility];  Service: Urology;  Laterality: Left;   CYSTOSCOPY WITH RETROGRADE PYELOGRAM, URETEROSCOPY AND STENT PLACEMENT Left 08/19/2015   Procedure: CYSTOSCOPY WITH RETROGRADE PYELOGRAM, URETEROSCOPY AND STENT PLACEMENT;  Surgeon: Jerilee Field, MD;  Location: [facility];  Service: Urology;  Laterality: Left;   CYSTOSCOPY WITH RETROGRADE PYELOGRAM, URETEROSCOPY AND STENT PLACEMENT Left 03/13/2018   Procedure: CYSTOSCOPY WITH RETROGRADE PYELOGRAM, URETEROSCOPY AND LEFT STENT PLACEMENT;  Surgeon: Bjorn Pippin, MD;  Location: [facility];  Service: Urology;  Laterality: Left;   CYSTOSCOPY WITH RETROGRADE PYELOGRAM, URETEROSCOPY AND STENT PLACEMENT Left 03/11/2021  Procedure: CYSTOSCOPY WITH RETROGRADE PYELOGRAM, URETEROSCOPY AND STENT PLACEMENT,STONE EXTRACTION,HOLMIUM LASER;   Surgeon: Marcine Matar, MD;  Location: [facility];  Service: Urology;  Laterality: Left;   CYSTOSCOPY WITH RETROGRADE PYELOGRAM, URETEROSCOPY AND STENT PLACEMENT Bilateral 05/06/2021   Procedure: CYSTOSCOPY WITH RETROGRADE PYELOGRAM, URETEROSCOPY AND STENT REPLACEMENT;  Surgeon: Sebastian Ache, MD;  Location: [facility];  Service: Urology;  Laterality: Bilateral;  90 MINS   CYSTOSCOPY WITH URETEROSCOPY AND STENT PLACEMENT Left 01/16/2016   Procedure: CYSTOSCOPY WITH LEFT RETROGRADE PYELOGRAM  LEFT DIGITAL URETEROSCOPY AND PLACEMENT LEFT URETERAL STENT;  Surgeon: Jerilee Field, MD;  Location: [facility];  Service: Urology;  Laterality: Left;   CYSTOSCOPY WITH URETEROSCOPY, STONE BASKETRY AND STENT PLACEMENT Left 02/13/2016   Procedure: CYSTOSCOPY WITH LEFT URETEROSCOPY, HOLMIUM LASER AND STENT PLACEMENT;  Surgeon: Jerilee Field, MD;  Location: [facility];  Service: Urology;  Laterality: Left;   CYSTOSCOPY/RETROGRADE/URETEROSCOPY/STONE EXTRACTION WITH BASKET Left 08/08/2015   Procedure: CYSTOSCOPY/RETROGRADE/URETEROSCOPY/STONE EXTRACTION WITH BASKET;  Surgeon: Jerilee Field, MD;  Location: [facility];  Service: Urology;  Laterality: Left;   CYSTOSCOPY/URETEROSCOPY/HOLMIUM LASER/STENT PLACEMENT Left 07/30/2016   Procedure: CYSTOSCOPY/URETEROSCOPY/HOLMIUM LASER/STENT PLACEMENT;  Surgeon: Jerilee Field, MD;  Location: [facility];  Service: Urology;  Laterality: Left;   CYSTOSCOPY/URETEROSCOPY/HOLMIUM LASER/STENT PLACEMENT Left 08/19/2017   Procedure: CYSTOSCOPY/URETEROSCOPY/HOLMIUM LASER/STENT PLACEMENT;  Surgeon: Jerilee Field, MD;  Location: [facility];  Service: Urology;  Laterality: Left;   CYSTOSCOPY/URETEROSCOPY/HOLMIUM LASER/STENT PLACEMENT N/A 10/13/2018   Procedure: CYSTOSCOPY/RETROGRADE RIGHT URETEROSCOPY/ AND RIGHTSTENT PLACEMENT;  Surgeon: Bjorn Pippin, MD;  Location: [facility];  Service: Urology;  Laterality: N/A;   EXTRACORPOREAL SHOCK WAVE  LITHOTRIPSY Left 07-07-2015  &  12-26-2014   FOOT CAPSULE RELEASE W/ PERCUTANEOUS HEEL CORD LENGTHENING, TIBIAL TENDON TRANSFER Left 1994   clubfoot   HOLMIUM LASER APPLICATION Left 07/05/2014   Procedure: LASER LITHO;  Surgeon: Jerilee Field, MD;  Location[facility];  Service: Urology;  Laterality: Left;   HOLMIUM LASER APPLICATION Left 08/08/2015   Procedure: HOLMIUM LASER  WITH LITHOTRIPSY ;  Surgeon: Jerilee Field, MD;  Location: [facility];  Service: Urology;  Laterality: Left;   HOLMIUM LASER APPLICATION Left 02/21/2017   Procedure: HOLMIUM LASER APPLICATION;  Surgeon: Bjorn Pippin, MD;  Location: [facility];  Service: Urology;  Laterality: Left;   HOLMIUM LASER APPLICATION Left 03/13/2018   Procedure: HOLMIUM LASER APPLICATION;  Surgeon: Bjorn Pippin, MD;  Location: [facility];  Service: Urology;  Laterality: Left;   HOLMIUM LASER APPLICATION Bilateral 05/06/2021   Procedure: HOLMIUM LASER APPLICATION;  Surgeon: Sebastian Ache, MD;  Location: [facility];  Service: Urology;  Laterality: Bilateral;   lumpectomy in neck for cat scratch fever      surgery for club foot at few days old   LYMPH GLAND EXCISION  2003   neck--  benign   SVT ABLATION N/A 05/22/2018   Procedure: SVT ABLATION;  Surgeon: Marinus Maw, MD;  Location: [facility];  Service: Cardiovascular;  Laterality: N/A;   SVT ABLATION     2019    URETEROSCOPY WITH HOLMIUM LASER LITHOTRIPSY Left 08/04/2021   Procedure: URETEROSCOPY WITH basket removal of stones/ STENT exchange/retrograde;  Surgeon: Jerilee Field, MD;  Location: [facility];  Service: Urology;  Laterality: Left;  ONLY NEEDS 60 MIN    Home Medications:    Allergies:  Allergies  Allergen Reactions   Bactrim [Sulfamethoxazole-Trimethoprim] Nausea And Vomiting   Ketamine Other (See Comments)    Did not like the sensation  Family History  Problem Relation Age of Onset   Cancer Father    Kidney Stones  Mother    Cancer Other    Hypertension Other    Hyperlipidemia Other    Stroke Other    Kidney Stones Brother     Social History:  reports that he quit smoking about 7 years ago. His smoking use included cigarettes. He has never used smokeless tobacco. He reports that he does not currently use alcohol. He reports that he does not use drugs.  ROS: A complete review of systems was performed.  All systems are negative except for pertinent findings as noted.  Physical Exam:  Vital signs in last 24 hours: BP 118/76   Pulse 84   Temp 97.7 F (36.5 C) (Oral)   Resp 15   Ht 5\' 8"  (1.727 m)   Wt 97.5 kg   SpO2 99%   BMI 32.69 kg/m  Constitutional:  Alert and oriented, No acute distress Cardiovascular: Regular rate  Respiratory: Normal respiratory effort GI: Abdomen is soft, nontender, nondistended, no abdominal masses. No CVAT.  Genitourinary: Normal male phallus, testes are descended bilaterally and non-tender and without masses, scrotum is normal in appearance without lesions or masses, perineum is normal on inspection. Lymphatic: No lymphadenopathy Neurologic: Grossly intact, no focal deficits Psychiatric: Normal mood and affect  I have reviewed prior pt notes  I have reviewed notes from referring/previous physicians  I have reviewed urinalysis results  I have independently reviewed prior imaging  I have reviewed prior PSA results  I have reviewed prior urine culture   Impression/Assessment:  Left ureteral stone, hydronephrosis, intractable pain  Plan:  Cystoscopy, left retrograde, left ureteroscopy, possible stone extraction/double-J stent

## 2021-11-05 ENCOUNTER — Encounter (HOSPITAL_COMMUNITY): Payer: Self-pay | Admitting: Urology

## 2021-11-24 NOTE — Discharge Summary (Signed)
Patient ID: Thomas Howell MRN: [id number] DOB/AGE: 27-Oct-1991 30 y.o.  Admit date: 11/04/2021 Discharge date: 11.16.2022  Primary Care Physician:  Tommie Sams, DO  Discharge Diagnoses: Ureteral calculus Present on Admission:  Ureteral calculus    Discharge Medications: Allergies as of 11/04/2021       Reactions   Bactrim [sulfamethoxazole-trimethoprim] Nausea And Vomiting   Ketamine Other (See Comments)   Did not like the sensation        Medication List     STOP taking these medications    tamsulosin 0.4 MG Caps capsule Commonly known as: Flomax       TAKE these medications    hydrochlorothiazide 25 MG tablet Commonly known as: HYDRODIURIL Take 25 mg by mouth daily. For kidney stone prevention   hydrOXYzine 10 MG tablet Commonly known as: ATARAX Take 1/2-1 tab po qd prn panic attacks and 1 tab po qhs prn sleep What changed:  how much to take how to take this when to take this reasons to take this additional instructions   ibuprofen 200 MG tablet Commonly known as: ADVIL Take 600 mg by mouth every 6 (six) hours as needed for headache, fever or mild pain.   oxyCODONE 5 MG immediate release tablet Commonly known as: Oxy IR/ROXICODONE Take 1 tablet (5 mg total) by mouth every 4 (four) hours as needed for moderate pain or severe pain. What changed:  medication strength how much to take reasons to take this   promethazine 12.5 MG tablet Commonly known as: PHENERGAN Take 12.5 mg by mouth every 6 (six) hours as needed for nausea/vomiting.   rizatriptan 10 MG disintegrating tablet Commonly known as: MAXALT-MLT Take 1 tablet (10 mg total) by mouth daily as needed for migraine. May repeat in 2 hrs if needed; max 2 per 24 hrs   sertraline 50 MG tablet Commonly known as: ZOLOFT Take 1/2 tab po qd x 6 days then one po qd What changed:  how much to take how to take this when to take this additional instructions       ASK your doctor  about these medications    cephALEXin 500 MG capsule Commonly known as: Keflex Take 1 capsule (500 mg total) by mouth 2 (two) times daily for 6 doses. Ask about: Should I take this medication?         Significant Diagnostic Studies:  DG C-Arm 1-60 Min-No Report  Result Date: 11/04/2021 Fluoroscopy was utilized by the requesting physician.  No radiographic interpretation.    Brief H and P: For complete details please refer to admission H and P, but in brief patient was admitted for observation prior to planned ureteroscopy.  Hospital Course: Patient underwent urgent ureteroscopy and stenting.  He was discharged from the PACU. Active Problems:   Ureteral calculus   Day of Discharge BP (!) 141/99 (BP Location: Right Arm)   Pulse 85   Temp (!) 97.5 F (36.4 C)   Resp 18   Ht 5\' 8"  (1.727 m)   Wt 97.5 kg   SpO2 98%   BMI 32.69 kg/m   No results found for this or any previous visit (from the past 24 hour(s)).  Physical Exam: General: Alert and awake oriented x3 not in any acute distress. HEENT: anicteric sclera, pupils reactive to light and accommodation CVS: S1-S2 clear no murmur rubs or gallops Chest: clear to auscultation bilaterally, no wheezing rales or rhonchi Abdomen: soft nontender, nondistended, normal bowel sounds, no organomegaly Extremities: no cyanosis, clubbing or  edema noted bilaterally Neuro: Cranial nerves Howell-XII intact, no focal neurological deficits  Disposition: Home  Diet: No restrictions  Activity: As tolerated    DISCHARGE FOLLOW-UP   Follow-up Information     [facility] Follow up.   Why: We will call you to set up appointment for follow-up Contact information: [address] [place] [address] [phone]                Time spent on Discharge:  5 minutes  Signed: Bertram Millard Thetis Schwimmer 11/24/2021, 9:18 AM

## 2021-12-06 ENCOUNTER — Emergency Department (HOSPITAL_COMMUNITY)
Admission: EM | Admit: 2021-12-06 | Discharge: 2021-12-07 | Disposition: A | Discharge status: Home / Self Care | Payer: BC Managed Care – PPO | Attending: Emergency Medicine | Admitting: Emergency Medicine

## 2021-12-06 ENCOUNTER — Emergency Department (HOSPITAL_COMMUNITY): Payer: BC Managed Care – PPO

## 2021-12-06 ENCOUNTER — Encounter (HOSPITAL_COMMUNITY): Payer: Self-pay

## 2021-12-06 ENCOUNTER — Other Ambulatory Visit: Payer: Self-pay

## 2021-12-06 DIAGNOSIS — N132 Hydronephrosis with renal and ureteral calculous obstruction: Secondary | ICD-10-CM | POA: Diagnosis not present

## 2021-12-06 DIAGNOSIS — K760 Fatty (change of) liver, not elsewhere classified: Secondary | ICD-10-CM | POA: Diagnosis not present

## 2021-12-06 DIAGNOSIS — R109 Unspecified abdominal pain: Secondary | ICD-10-CM | POA: Diagnosis not present

## 2021-12-06 DIAGNOSIS — M419 Scoliosis, unspecified: Secondary | ICD-10-CM | POA: Diagnosis not present

## 2021-12-06 DIAGNOSIS — N200 Calculus of kidney: Secondary | ICD-10-CM | POA: Diagnosis not present

## 2021-12-06 DIAGNOSIS — Z87891 Personal history of nicotine dependence: Secondary | ICD-10-CM | POA: Insufficient documentation

## 2021-12-06 LAB — CBC
HCT: 42.8 % (ref 39.0–52.0)
Hemoglobin: 14.9 g/dL (ref 13.0–17.0)
MCH: 29.2 pg (ref 26.0–34.0)
MCHC: 34.8 g/dL (ref 30.0–36.0)
MCV: 83.8 fL (ref 80.0–100.0)
Platelets: 200 10*3/uL (ref 150–400)
RBC: 5.11 MIL/uL (ref 4.22–5.81)
RDW: 12.6 % (ref 11.5–15.5)
WBC: 6.8 10*3/uL (ref 4.0–10.5)
nRBC: 0 % (ref 0.0–0.2)

## 2021-12-06 MED ORDER — KETOROLAC TROMETHAMINE 15 MG/ML IJ SOLN
15.0000 mg | Freq: Once | INTRAMUSCULAR | Status: AC
  Administered 2021-12-06: 23:00:00 15 mg via INTRAVENOUS
  Filled 2021-12-06: qty 1

## 2021-12-06 MED ORDER — SODIUM CHLORIDE 0.9 % IV BOLUS
1000.0000 mL | Freq: Once | INTRAVENOUS | Status: AC
  Administered 2021-12-06: 23:00:00 1000 mL via INTRAVENOUS

## 2021-12-06 MED ORDER — HYDROMORPHONE HCL 1 MG/ML IJ SOLN
1.0000 mg | Freq: Once | INTRAMUSCULAR | Status: AC
  Administered 2021-12-06: 23:00:00 1 mg via INTRAVENOUS
  Filled 2021-12-06: qty 1

## 2021-12-06 MED ORDER — ONDANSETRON HCL 4 MG/2ML IJ SOLN
4.0000 mg | Freq: Once | INTRAMUSCULAR | Status: AC
  Administered 2021-12-06: 23:00:00 4 mg via INTRAVENOUS
  Filled 2021-12-06: qty 2

## 2021-12-06 NOTE — ED Provider Notes (Signed)
[facility] Emergency Department Provider Note MRN:  [id number]  Arrival date & time: 12/07/21     Chief Complaint   Flank Pain   History of Present Illness   Thomas Howell is a 30 y.o. year-old male with a history of kidney stones presenting to the ED with chief complaint of flank pain.  Location: Left flank Duration: 2 days, much worse this evening Onset: Sudden Timing: Constant Description: Dull ache, feels similar to prior kidney stone Severity: Severe Exacerbating/Alleviating Factors: None Associated Symptoms: Nausea vomiting, difficulty voiding Pertinent Negatives: No fever, no burning with urination  Additional History: Has had multiple procedures for kidney stones in the past   Review of Systems  A complete 10 system review of systems was obtained and all systems are negative except as noted in the HPI and PMH.   Patient's Health History    Past Medical History:  Diagnosis Date   Complication of anesthesia    Dysrhythmia    hx of SVT ablation in 2019 - DR Lewayne Bunting dismissed by MD    Frequency-urgency syndrome    History of kidney stones    Hx of nausea and vomiting    d/t kidney stone   Hx of sepsis 08/11/2017   due to kidney stone/ hydronephrosis   Left ureteral stone    Migraine    hx of migraines    Pain due to ureteral stent (HCC)    PONV (postoperative nausea and vomiting)    none recently   Scoliosis of lumbar spine 1994   treated at [facility] until age 9   Wears glasses    reading only    Past Surgical History:  Procedure Laterality Date   CYSTOSCOPY W/ RETROGRADES Right 07/06/2015   Procedure: CYSTOSCOPY WITH RETROGRADE PYELOGRAM;  Surgeon: Jerilee Field, MD;  [facility];  Service: Urology;  Laterality: Right;   CYSTOSCOPY W/ URETERAL STENT PLACEMENT Left 08/08/2015   Procedure: CYSTOSCOPY WITH STENT REPLACEMENT;  Surgeon: Jerilee Field, MD;  Location: [facility];  Service:  Urology;  Laterality: Left;   CYSTOSCOPY W/ URETERAL STENT PLACEMENT Left 02/21/2017   Procedure: CYSTOSCOPY WITH RETROGRADE PYELOGRAM/URETERAL LEFT STENT PLACEMENT WITH  LASER;  Surgeon: Bjorn Pippin, MD;  [facility];  Service: Urology;  Laterality: Left;   CYSTOSCOPY W/ URETERAL STENT PLACEMENT Left 08/10/2017   Procedure: CYSTOSCOPY WITH RETROGRADE PYELOGRAM/URETERAL STENT PLACEMENT;  Surgeon: Heloise Purpura, MD;  [facility];  Service: Urology;  Laterality: Left;   CYSTOSCOPY W/ URETERAL STENT PLACEMENT Bilateral 04/19/2021   Procedure: CYSTOSCOPY WITH RETROGRADE PYELOGRAM/URETERAL STENT PLACEMENT;  Surgeon: Sebastian Ache, MD;  [facility];  Service: Urology;  Laterality: Bilateral;   CYSTOSCOPY W/ URETERAL STENT PLACEMENT Left 07/11/2021   Procedure: CYSTOSCOPY WITH RETROGRADE PYELOGRAM/URETEROSCOPY/URETERAL STENT PLACEMENT;  Surgeon: Marcine Matar, MD;  [facility];  Service: Urology;  Laterality: Left;   CYSTOSCOPY W/ URETERAL STENT PLACEMENT Left 11/04/2021   Procedure: CYSTOSCOPY WITH RETROGRADE PYELOGRAM//URETEROSCOPY/STONE EXTRACTION/URETERAL STENT PLACEMENT;  Surgeon: Marcine Matar, MD;  [facility];  Service: Urology;  Laterality: Left;   CYSTOSCOPY WITH RETROGRADE PYELOGRAM, URETEROSCOPY AND STENT PLACEMENT Left 06/24/2014   Procedure: CYSTOSCOPY WITH RETROGRADE PYELOGRAM,  AND STENT PLACEMENT;  Surgeon: Magdalene Molly, MD;  [facility];  Service: Urology;  Laterality: Left;   CYSTOSCOPY WITH RETROGRADE PYELOGRAM, URETEROSCOPY AND STENT PLACEMENT Left 07/05/2014   Procedure: CYSTO/LEFT URETEROSCOPY/LEFT RETROGRADE PYELOGRAM/LEFT STENT PLACEMENT;  Surgeon: Jerilee Field, MD;  Location: [facility];  Service:  Urology;  Laterality: Left;   CYSTOSCOPY WITH RETROGRADE PYELOGRAM, URETEROSCOPY AND STENT PLACEMENT Left 07/06/2015   Procedure: CYSTOSCOPY WITH RETROGRADE PYELOGRAM, URETEROSCOPY , LASER, STENT PLACEMENT and BASKET  EXTRACTION;  Surgeon: Jerilee Field, MD;  [facility];  Service: Urology;  Laterality: Left;   CYSTOSCOPY WITH RETROGRADE PYELOGRAM, URETEROSCOPY AND STENT PLACEMENT Left 08/19/2015   Procedure: CYSTOSCOPY WITH RETROGRADE PYELOGRAM, URETEROSCOPY AND STENT PLACEMENT;  Surgeon: Jerilee Field, MD;  [facility];  Service: Urology;  Laterality: Left;   CYSTOSCOPY WITH RETROGRADE PYELOGRAM, URETEROSCOPY AND STENT PLACEMENT Left 03/13/2018   Procedure: CYSTOSCOPY WITH RETROGRADE PYELOGRAM, URETEROSCOPY AND LEFT STENT PLACEMENT;  Surgeon: Bjorn Pippin, MD;  [facility];  Service: Urology;  Laterality: Left;   CYSTOSCOPY WITH RETROGRADE PYELOGRAM, URETEROSCOPY AND STENT PLACEMENT Left 03/11/2021   Procedure: CYSTOSCOPY WITH RETROGRADE PYELOGRAM, URETEROSCOPY AND STENT PLACEMENT,STONE EXTRACTION,HOLMIUM LASER;  Surgeon: Marcine Matar, MD;  [facility];  Service: Urology;  Laterality: Left;   CYSTOSCOPY WITH RETROGRADE PYELOGRAM, URETEROSCOPY AND STENT PLACEMENT Bilateral 05/06/2021   Procedure: CYSTOSCOPY WITH RETROGRADE PYELOGRAM, URETEROSCOPY AND STENT REPLACEMENT;  Surgeon: Sebastian Ache, MD;  [facility];  Service: Urology;  Laterality: Bilateral;  90 MINS   CYSTOSCOPY WITH URETEROSCOPY AND STENT PLACEMENT Left 01/16/2016   Procedure: CYSTOSCOPY WITH LEFT RETROGRADE PYELOGRAM  LEFT DIGITAL URETEROSCOPY AND PLACEMENT LEFT URETERAL STENT;  Surgeon: Jerilee Field, MD;  [facility];  Service: Urology;  Laterality: Left;   CYSTOSCOPY WITH URETEROSCOPY, STONE BASKETRY AND STENT PLACEMENT Left 02/13/2016   Procedure: CYSTOSCOPY WITH LEFT URETEROSCOPY, HOLMIUM LASER AND STENT PLACEMENT;  Surgeon: Jerilee Field, MD;  [facility];  Service: Urology;  Laterality: Left;   CYSTOSCOPY/RETROGRADE/URETEROSCOPY/STONE EXTRACTION WITH BASKET Left 08/08/2015   Procedure: CYSTOSCOPY/RETROGRADE/URETEROSCOPY/STONE EXTRACTION WITH BASKET;  Surgeon: Jerilee Field, MD;   Location: [facility];  Service: Urology;  Laterality: Left;   CYSTOSCOPY/URETEROSCOPY/HOLMIUM LASER/STENT PLACEMENT Left 07/30/2016   Procedure: CYSTOSCOPY/URETEROSCOPY/HOLMIUM LASER/STENT PLACEMENT;  Surgeon: Jerilee Field, MD;  Location: [facility];  Service: Urology;  Laterality: Left;   CYSTOSCOPY/URETEROSCOPY/HOLMIUM LASER/STENT PLACEMENT Left 08/19/2017   Procedure: CYSTOSCOPY/URETEROSCOPY/HOLMIUM LASER/STENT PLACEMENT;  Surgeon: Jerilee Field, MD;  Location: [facility];  Service: Urology;  Laterality: Left;   CYSTOSCOPY/URETEROSCOPY/HOLMIUM LASER/STENT PLACEMENT N/A 10/13/2018   Procedure: CYSTOSCOPY/RETROGRADE RIGHT URETEROSCOPY/ AND RIGHTSTENT PLACEMENT;  Surgeon: Bjorn Pippin, MD;  [facility];  Service: Urology;  Laterality: N/A;   EXTRACORPOREAL SHOCK WAVE LITHOTRIPSY Left 07-07-2015  &  12-26-2014   FOOT CAPSULE RELEASE W/ PERCUTANEOUS HEEL CORD LENGTHENING, TIBIAL TENDON TRANSFER Left 1994   clubfoot   HOLMIUM LASER APPLICATION Left 07/05/2014   Procedure: LASER LITHO;  Surgeon: Jerilee Field, MD;  Location: [facility];  Service: Urology;  Laterality: Left;   HOLMIUM LASER APPLICATION Left 08/08/2015   Procedure: HOLMIUM LASER  WITH LITHOTRIPSY ;  Surgeon: Jerilee Field, MD;  Location: [facility];  Service: Urology;  Laterality: Left;   HOLMIUM LASER APPLICATION Left 02/21/2017   Procedure: HOLMIUM LASER APPLICATION;  Surgeon: Bjorn Pippin, MD;  [facility];  Service: Urology;  Laterality: Left;   HOLMIUM LASER APPLICATION Left 03/13/2018   Procedure: HOLMIUM LASER APPLICATION;  Surgeon: Bjorn Pippin, MD;  [facility];  Service: Urology;  Laterality: Left;   HOLMIUM LASER APPLICATION Bilateral 05/06/2021   Procedure: HOLMIUM LASER APPLICATION;  Surgeon: Sebastian Ache, MD;  [facility];  Service: Urology;  Laterality: Bilateral;   lumpectomy in neck for cat scratch fever  surgery for club foot at few days old   LYMPH GLAND EXCISION  2003   neck--  benign   SVT ABLATION N/A 05/22/2018   Procedure: SVT ABLATION;  Surgeon: Marinus Maw, MD;  Location: [facility] INVASIVE CV LAB;  Service: Cardiovascular;  Laterality: N/A;   SVT ABLATION     2019    URETEROSCOPY WITH HOLMIUM LASER LITHOTRIPSY Left 08/04/2021   Procedure: URETEROSCOPY WITH basket removal of stones/ STENT exchange/retrograde;  Surgeon: Jerilee Field, MD;  Location: [facility];  Service: Urology;  Laterality: Left;  ONLY NEEDS 60 MIN    Family History  Problem Relation Age of Onset   Cancer Father    Kidney Stones Mother    Cancer Other    Hypertension Other    Hyperlipidemia Other    Stroke Other    Kidney Stones Brother     Social History   Socioeconomic History   Marital status: Single    Spouse name: Not on file   Number of children: Not on file   Years of education: Not on file   Highest education level: Not on file  Occupational History   Not on file  Tobacco Use   Smoking status: Former    Years: 2.00    Types: Cigarettes    Quit date: 07/29/2014    Years since quitting: 7.3   Smokeless tobacco: Never   Tobacco comments:    social smoker  Vaping Use   Vaping Use: Never used  Substance and Sexual Activity   Alcohol use: Not Currently    Alcohol/week: 0.0 standard drinks   Drug use: No   Sexual activity: Never  Other Topics Concern   Not on file  Social History Narrative   Not on file   Social Determinants of Health   Financial Resource Strain: Not on file  Food Insecurity: Not on file  Transportation Needs: Not on file  Physical Activity: Not on file  Stress: Not on file  Social Connections: Not on file  Intimate Partner Violence: Not on file     Physical Exam   Vitals:   12/07/21 0330 12/07/21 0415  BP: 109/65 107/67  Pulse: 88 83  Resp: 16 16  SpO2: 98% 97%    CONSTITUTIONAL: Well-appearing, in moderate distress due to  pain NEURO:  Alert and oriented x 3, no focal deficits EYES:  eyes equal and reactive ENT/NECK:  no LAD, no JVD CARDIO: Regular rate, well-perfused, normal S1 and S2 PULM:  CTAB no wheezing or rhonchi GI/GU:  normal bowel sounds, non-distended, non-tender; left CVA tenderness MSK/SPINE:  No gross deformities, no edema SKIN:  no rash, atraumatic PSYCH:  Appropriate speech and behavior  *Additional and/or pertinent findings included in MDM below  Diagnostic and Interventional Summary    EKG Interpretation  Date/Time:    Ventricular Rate:    PR Interval:    QRS Duration:   QT Interval:    QTC Calculation:   R Axis:     Text Interpretation:         Labs Reviewed  BASIC METABOLIC PANEL - Abnormal; Notable for the following components:      Result Value   Potassium 2.6 (*)    Glucose, Bld 141 (*)    All other components within normal limits  URINALYSIS, ROUTINE W REFLEX MICROSCOPIC - Abnormal; Notable for the following components:   Hgb urine dipstick LARGE (*)    Protein, ur 100 (*)    RBC / HPF >50 (*)  Bacteria, UA RARE (*)    All other components within normal limits  CBC    CT RENAL STONE STUDY  Final Result      Medications  HYDROmorphone (DILAUDID) injection 1 mg (1 mg Intravenous Given 12/06/21 2323)  ketorolac (TORADOL) 15 MG/ML injection 15 mg (15 mg Intravenous Given 12/06/21 2323)  sodium chloride 0.9 % bolus 1,000 mL (0 mLs Intravenous Stopped 12/07/21 0040)  ondansetron (ZOFRAN) injection 4 mg (4 mg Intravenous Given 12/06/21 2323)  HYDROmorphone (DILAUDID) injection 1 mg (1 mg Intravenous Given 12/07/21 0017)  potassium chloride SA (KLOR-CON M) CR tablet 40 mEq (40 mEq Oral Given 12/07/21 0102)  HYDROmorphone (DILAUDID) injection 1 mg (1 mg Intravenous Given 12/07/21 0341)     Procedures  /  Critical Care Procedures  ED Course and Medical Decision Making  I have reviewed the triage vital signs, the nursing notes, and pertinent available records  from the EMR.  Listed above are laboratory and imaging tests that I personally ordered, reviewed, and interpreted and then considered in my medical decision making (see below for details).  Suspect recurrent kidney stone, awaiting labs, CT and.     CT confirms stone.  Labs and urine reassuring.  Patient required multiple rounds of dilaudid but now seems much better.  Appropriate for dc with urology fu.  Elmer Sow. Pilar Plate, MD [facility] Emergency Medicine [facility] [email or web address]  Final Clinical Impressions(s) / ED Diagnoses     ICD-10-CM   1. Kidney stone  N20.0       ED Discharge Orders          Ordered    oxyCODONE (ROXICODONE) 5 MG immediate release tablet  Every 4 hours PRN        12/07/21 0438             Discharge Instructions Discussed with and Provided to Patient:     Discharge Instructions      You were evaluated in the Emergency Department and after careful evaluation, we did not find any emergent condition requiring admission or further testing in the hospital.  Your exam/testing today was overall reassuring.  Symptoms seem to be due to a kidney stone.  Take Flomax daily to help you pass the stone.  Recommend Tylenol 1000 mg every 4-6 hours and/or Motrin 600 mg every 4-6 hours for pain.  You can use the oxycodone medication for more significant pain.  Follow closely with your urologist.  Please return to the Emergency Department if you experience any worsening of your condition.  Thank you for allowing [place] to be a part of your care.         Sabas Sous, MD 12/07/21 [phone]

## 2021-12-06 NOTE — ED Triage Notes (Signed)
Pt reports with left flank pain since yesterday. Pt states that his urine was dark yesterday and now he is barely able to urinate.

## 2021-12-07 DIAGNOSIS — N200 Calculus of kidney: Secondary | ICD-10-CM | POA: Diagnosis not present

## 2021-12-07 LAB — URINALYSIS, ROUTINE W REFLEX MICROSCOPIC
Bilirubin Urine: NEGATIVE
Glucose, UA: NEGATIVE mg/dL
Ketones, ur: NEGATIVE mg/dL
Leukocytes,Ua: NEGATIVE
Nitrite: NEGATIVE
Protein, ur: 100 mg/dL — AB
RBC / HPF: >50 RBC/hpf — ABNORMAL HIGH (ref 0–5)
Specific Gravity, Urine: 1.016 (ref 1.005–1.030)
pH: 6 (ref 5.0–8.0)

## 2021-12-07 LAB — BASIC METABOLIC PANEL
Anion gap: 11 (ref 5–15)
BUN: 17 mg/dL (ref 6–20)
CO2: 23 mmol/L (ref 22–32)
Calcium: 9 mg/dL (ref 8.9–10.3)
Chloride: 101 mmol/L (ref 98–111)
Creatinine, Ser: 1.06 mg/dL (ref 0.61–1.24)
GFR, Estimated: >60 mL/min (ref >60)
Glucose, Bld: 141 mg/dL — ABNORMAL HIGH (ref 70–99)
Potassium: 2.6 mmol/L — CL (ref 3.5–5.1)
Sodium: 135 mmol/L (ref 135–145)

## 2021-12-07 MED ORDER — POTASSIUM CHLORIDE CRYS ER 20 MEQ PO TBCR
40.0000 meq | EXTENDED_RELEASE_TABLET | Freq: Once | ORAL | Status: AC
  Administered 2021-12-07: 01:00:00 40 meq via ORAL
  Filled 2021-12-07: qty 2

## 2021-12-07 MED ORDER — OXYCODONE HCL 5 MG PO TABS: 5.0000 mg | ORAL_TABLET | ORAL | 0 refills | Status: DC | PRN

## 2021-12-07 MED ORDER — HYDROMORPHONE HCL 1 MG/ML IJ SOLN
1.0000 mg | Freq: Once | INTRAMUSCULAR | Status: AC
  Administered 2021-12-07: 04:00:00 1 mg via INTRAVENOUS
  Filled 2021-12-07: qty 1

## 2021-12-07 MED ORDER — HYDROMORPHONE HCL 1 MG/ML IJ SOLN
1.0000 mg | Freq: Once | INTRAMUSCULAR | Status: AC
  Administered 2021-12-07: 1 mg via INTRAVENOUS
  Filled 2021-12-07: qty 1

## 2021-12-07 NOTE — Discharge Instructions (Signed)
You were evaluated in the Emergency Department and after careful evaluation, we did not find any emergent condition requiring admission or further testing in the hospital.  Your exam/testing today was overall reassuring.  Symptoms seem to be due to a kidney stone.  Take Flomax daily to help you pass the stone.  Recommend Tylenol 1000 mg every 4-6 hours and/or Motrin 600 mg every 4-6 hours for pain.  You can use the oxycodone medication for more significant pain.  Follow closely with your urologist.  Please return to the Emergency Department if you experience any worsening of your condition.  Thank you for allowing [place] to be a part of your care.

## 2021-12-08 DIAGNOSIS — M546 Pain in thoracic spine: Secondary | ICD-10-CM | POA: Diagnosis not present

## 2021-12-08 DIAGNOSIS — G8929 Other chronic pain: Secondary | ICD-10-CM | POA: Diagnosis not present

## 2021-12-08 DIAGNOSIS — M5459 Other low back pain: Secondary | ICD-10-CM | POA: Diagnosis not present

## 2021-12-08 DIAGNOSIS — F4542 Pain disorder with related psychological factors: Secondary | ICD-10-CM | POA: Diagnosis not present

## 2021-12-17 ENCOUNTER — Other Ambulatory Visit: Payer: Self-pay | Admitting: Nurse Practitioner

## 2022-01-05 DIAGNOSIS — M546 Pain in thoracic spine: Secondary | ICD-10-CM | POA: Diagnosis not present

## 2022-01-05 DIAGNOSIS — M5459 Other low back pain: Secondary | ICD-10-CM | POA: Diagnosis not present

## 2022-01-05 DIAGNOSIS — G8929 Other chronic pain: Secondary | ICD-10-CM | POA: Diagnosis not present

## 2022-01-05 DIAGNOSIS — F4542 Pain disorder with related psychological factors: Secondary | ICD-10-CM | POA: Diagnosis not present

## 2022-01-06 DIAGNOSIS — Z79891 Long term (current) use of opiate analgesic: Secondary | ICD-10-CM | POA: Diagnosis not present

## 2022-01-15 ENCOUNTER — Other Ambulatory Visit: Payer: Self-pay

## 2022-01-15 ENCOUNTER — Emergency Department (HOSPITAL_COMMUNITY)
Admission: EM | Admit: 2022-01-15 | Discharge: 2022-01-15 | Disposition: A | Discharge status: Home / Self Care | Payer: BC Managed Care – PPO | Attending: Emergency Medicine | Admitting: Emergency Medicine

## 2022-01-15 ENCOUNTER — Encounter (HOSPITAL_COMMUNITY): Payer: Self-pay

## 2022-01-15 ENCOUNTER — Emergency Department (HOSPITAL_COMMUNITY): Payer: BC Managed Care – PPO

## 2022-01-15 DIAGNOSIS — N1330 Unspecified hydronephrosis: Secondary | ICD-10-CM | POA: Insufficient documentation

## 2022-01-15 DIAGNOSIS — R1110 Vomiting, unspecified: Secondary | ICD-10-CM | POA: Diagnosis not present

## 2022-01-15 DIAGNOSIS — K760 Fatty (change of) liver, not elsewhere classified: Secondary | ICD-10-CM | POA: Diagnosis not present

## 2022-01-15 DIAGNOSIS — N200 Calculus of kidney: Secondary | ICD-10-CM | POA: Diagnosis not present

## 2022-01-15 DIAGNOSIS — M419 Scoliosis, unspecified: Secondary | ICD-10-CM | POA: Diagnosis not present

## 2022-01-15 DIAGNOSIS — Q433 Congenital malformations of intestinal fixation: Secondary | ICD-10-CM | POA: Diagnosis not present

## 2022-01-15 DIAGNOSIS — Z87891 Personal history of nicotine dependence: Secondary | ICD-10-CM | POA: Insufficient documentation

## 2022-01-15 DIAGNOSIS — R109 Unspecified abdominal pain: Secondary | ICD-10-CM | POA: Insufficient documentation

## 2022-01-15 LAB — CBC WITH DIFFERENTIAL/PLATELET
Abs Immature Granulocytes: 0.02 10*3/uL (ref 0.00–0.07)
Basophils Absolute: 0 10*3/uL (ref 0.0–0.1)
Basophils Relative: 1 %
Eosinophils Absolute: 0.2 10*3/uL (ref 0.0–0.5)
Eosinophils Relative: 2 %
HCT: 43 % (ref 39.0–52.0)
Hemoglobin: 14.7 g/dL (ref 13.0–17.0)
Immature Granulocytes: 0 %
Lymphocytes Relative: 45 %
Lymphs Abs: 3.2 10*3/uL (ref 0.7–4.0)
MCH: 29.8 pg (ref 26.0–34.0)
MCHC: 34.2 g/dL (ref 30.0–36.0)
MCV: 87.2 fL (ref 80.0–100.0)
Monocytes Absolute: 0.5 10*3/uL (ref 0.1–1.0)
Monocytes Relative: 7 %
Neutro Abs: 3.2 10*3/uL (ref 1.7–7.7)
Neutrophils Relative %: 45 %
Platelets: 179 10*3/uL (ref 150–400)
RBC: 4.93 MIL/uL (ref 4.22–5.81)
RDW: 12.9 % (ref 11.5–15.5)
WBC: 7.1 10*3/uL (ref 4.0–10.5)
nRBC: 0 % (ref 0.0–0.2)

## 2022-01-15 LAB — URINALYSIS, ROUTINE W REFLEX MICROSCOPIC
Bilirubin Urine: NEGATIVE
Glucose, UA: NEGATIVE mg/dL
Hgb urine dipstick: NEGATIVE
Ketones, ur: NEGATIVE mg/dL
Leukocytes,Ua: NEGATIVE
Nitrite: NEGATIVE
Protein, ur: NEGATIVE mg/dL
Specific Gravity, Urine: 1.03 (ref 1.005–1.030)
pH: 6 (ref 5.0–8.0)

## 2022-01-15 LAB — COMPREHENSIVE METABOLIC PANEL
ALT: 34 U/L (ref 0–44)
AST: 30 U/L (ref 15–41)
Albumin: 4.2 g/dL (ref 3.5–5.0)
Alkaline Phosphatase: 73 U/L (ref 38–126)
Anion gap: 5 (ref 5–15)
BUN: 17 mg/dL (ref 6–20)
CO2: 27 mmol/L (ref 22–32)
Calcium: 8.7 mg/dL — ABNORMAL LOW (ref 8.9–10.3)
Chloride: 100 mmol/L (ref 98–111)
Creatinine, Ser: 0.94 mg/dL (ref 0.61–1.24)
GFR, Estimated: >60 mL/min (ref >60)
Glucose, Bld: 123 mg/dL — ABNORMAL HIGH (ref 70–99)
Potassium: 3.4 mmol/L — ABNORMAL LOW (ref 3.5–5.1)
Sodium: 132 mmol/L — ABNORMAL LOW (ref 135–145)
Total Bilirubin: 0.4 mg/dL (ref 0.3–1.2)
Total Protein: 7.6 g/dL (ref 6.5–8.1)

## 2022-01-15 MED ORDER — HYDROMORPHONE HCL 1 MG/ML IJ SOLN
1.0000 mg | Freq: Once | INTRAMUSCULAR | Status: AC
Start: 2022-01-15 — End: 2022-01-15
  Administered 2022-01-15: 1 mg via INTRAVENOUS
  Filled 2022-01-15: qty 1

## 2022-01-15 MED ORDER — TAMSULOSIN HCL 0.4 MG PO CAPS
0.4000 mg | ORAL_CAPSULE | Freq: Once | ORAL | Status: AC
  Administered 2022-01-15: 0.4 mg via ORAL
  Filled 2022-01-15: qty 1

## 2022-01-15 MED ORDER — ONDANSETRON HCL 4 MG/2ML IJ SOLN
4.0000 mg | Freq: Once | INTRAMUSCULAR | Status: AC
Start: 2022-01-15 — End: 2022-01-15
  Administered 2022-01-15: 4 mg via INTRAVENOUS
  Filled 2022-01-15: qty 2

## 2022-01-15 MED ORDER — SODIUM CHLORIDE 0.9 % IV BOLUS
1000.0000 mL | Freq: Once | INTRAVENOUS | Status: AC
  Administered 2022-01-15: 1000 mL via INTRAVENOUS

## 2022-01-15 MED ORDER — KETOROLAC TROMETHAMINE 30 MG/ML IJ SOLN
30.0000 mg | Freq: Once | INTRAMUSCULAR | Status: AC
Start: 2022-01-15 — End: 2022-01-15
  Administered 2022-01-15: 30 mg via INTRAVENOUS
  Filled 2022-01-15: qty 1

## 2022-01-15 MED ORDER — HYDROMORPHONE HCL 1 MG/ML IJ SOLN
1.0000 mg | Freq: Once | INTRAMUSCULAR | Status: AC
  Administered 2022-01-15: 1 mg via INTRAVENOUS
  Filled 2022-01-15: qty 1

## 2022-01-15 NOTE — ED Triage Notes (Signed)
Pt arrived from home with complaints of flank pain that started 3 days ago. Has hx of multiple kidney stones, last surgery for stones were 10/2022. Stents were removed 11/2022. Pt states he has had N/V and has 3 episodes of vomiting within last 24 hours. Pain 10/10 during triage.

## 2022-01-15 NOTE — ED Provider Notes (Signed)
Emergency Department Provider Note  I have reviewed the triage vital signs and the nursing notes.  HISTORY  Chief Complaint Flank Pain   HPI Thomas Howell is a 31 y.o. male with a history of kidney stones presents with left flank pain similar to the same.  Patient states he had multiple kidney stones in the past sometimes requiring procedures sometimes not.  Patient states that this has been going on for the last few days, Significantly worse in the last 24 hours associate with vomiting.  Has 10 out of 10 left flank pain radiates around.  Similar to previous kidney stones.  No fevers.  No other urinary changes.  PMH Past Medical History:  Diagnosis Date   Complication of anesthesia    Dysrhythmia    hx of SVT ablation in 2019 - DR Lewayne Bunting dismissed by MD    Frequency-urgency syndrome    History of kidney stones    Hx of nausea and vomiting    d/t kidney stone   Hx of sepsis 08/11/2017   due to kidney stone/ hydronephrosis   Left ureteral stone    Migraine    hx of migraines    Pain due to ureteral stent (HCC)    PONV (postoperative nausea and vomiting)    none recently   Scoliosis of lumbar spine 1994   treated at [facility] until age 24   Wears glasses    reading only    Home Medications Prior to Admission medications   Medication Sig Start Date End Date Taking? Authorizing Provider  hydrochlorothiazide (HYDRODIURIL) 25 MG tablet Take 25 mg by mouth daily. For kidney stone prevention    [provider]  hydrOXYzine (ATARAX/VISTARIL) 10 MG tablet Take 1/2-1 tab po qd prn panic attacks and 1 tab po qhs prn sleep Patient taking differently: Take 10 mg by mouth daily as needed for anxiety. 06/26/21   Campbell Riches, NP  ibuprofen (ADVIL) 200 MG tablet Take 600 mg by mouth every 6 (six) hours as needed for headache, fever or mild pain.    [provider]  oxyCODONE (ROXICODONE) 5 MG immediate release tablet Take 1 tablet (5 mg total) by mouth  every 4 (four) hours as needed for severe pain. 12/07/21   Sabas Sous, MD  promethazine (PHENERGAN) 12.5 MG tablet Take 12.5 mg by mouth every 6 (six) hours as needed for nausea/vomiting. Patient not taking: Reported on 11/04/2021 11/02/21   [provider]  rizatriptan (MAXALT-MLT) 10 MG disintegrating tablet DISSOLVE 1 TABLET(10 MG) ON THE TONGUE DAILY AS NEEDED FOR MIGRAINE. MAY REPEAT IN 2 HOURS AS NEEDED. MAX 2 PER 24 HOURS 12/18/21   Sherie Don C, NP  sertraline (ZOLOFT) 50 MG tablet Take 1/2 tab po qd x 6 days then one po qd Patient taking differently: Take 50 mg by mouth daily. 11/02/21   Tommie Sams, DO    Social History Social History   Tobacco Use   Smoking status: Former    Years: 2.00    Types: Cigarettes    Quit date: 07/29/2014    Years since quitting: 7.4   Smokeless tobacco: Never   Tobacco comments:    social smoker  Vaping Use   Vaping Use: Never used  Substance Use Topics   Alcohol use: Not Currently    Alcohol/week: 0.0 standard drinks   Drug use: No    Review of Systems: Documented in HPI ____________________________________________  PHYSICAL EXAM: VITAL SIGNS: ED Triage Vitals  Enc Vitals Group     BP 01/15/22 0256 (!) 150/105     Pulse Rate 01/15/22 0256 96     Resp 01/15/22 0256 18     Temp 01/15/22 0256 98.5 F (36.9 C)     Temp Source 01/15/22 0256 Oral     SpO2 01/15/22 0256 97 %     Weight 01/15/22 0235 215 lb (97.5 kg)     Height 01/15/22 0235 5\' 8"  (1.727 m)   Physical Exam Vitals and nursing note reviewed.  Constitutional:      Appearance: He is well-developed.  HENT:     Head: Normocephalic and atraumatic.     Mouth/Throat:     Mouth: Mucous membranes are moist.     Pharynx: Oropharynx is clear.  Eyes:     Pupils: Pupils are equal, round, and reactive to light.  Cardiovascular:     Rate and Rhythm: Normal rate.  Pulmonary:     Effort: Pulmonary effort is normal. No respiratory distress.  Abdominal:      General: There is no distension.  Musculoskeletal:        General: Tenderness (Left CVA) present. No swelling. Normal range of motion.     Cervical back: Normal range of motion.  Skin:    General: Skin is warm and dry.     Coloration: Skin is not jaundiced or pale.     Findings: No rash (No obvious rashes).  Neurological:     General: No focal deficit present.     Mental Status: He is alert.   ____________________________________________   LABS (all labs ordered are listed, but only abnormal results are displayed)  Labs Reviewed  COMPREHENSIVE METABOLIC PANEL - Abnormal; Notable for the following components:      Result Value   Sodium 132 (*)    Potassium 3.4 (*)    Glucose, Bld 123 (*)    Calcium 8.7 (*)    All other components within normal limits  URINALYSIS, ROUTINE W REFLEX MICROSCOPIC - Abnormal; Notable for the following components:   APPearance CLEAR (*)    All other components within normal limits  CBC WITH DIFFERENTIAL/PLATELET   ____________________________________________  RADIOLOGY  CT Renal Stone Study  Result Date: 01/15/2022 CLINICAL DATA:  Flank pain with kidney stone suspected. Left-sided flank pain this started 3 days ago EXAM: CT ABDOMEN AND PELVIS WITHOUT CONTRAST TECHNIQUE: Multidetector CT imaging of the abdomen and pelvis was performed following the standard protocol without IV contrast. RADIATION DOSE REDUCTION: This exam was performed according to the departmental dose-optimization program which includes automated exposure control, adjustment of the mA and/or kV according to patient size and/or use of iterative reconstruction technique. COMPARISON:  12/06/2021 FINDINGS: Lower chest:  No contributory findings. Hepatobiliary: Hepatic steatosis.No evidence of biliary obstruction or stone. Pancreas: Unremarkable. Spleen: Unremarkable. Adrenals/Urinary Tract: Negative adrenals. No hydronephrosis or ureteral stone. Left caliectasis possibly from prior  hydronephrosis. Numerous renal calculi on the left more than right. Stones on the left appear larger if not more numerous with the largest at the lower pole measuring ~ 1 cm. Negative bladder. 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 This is a known finding from prior. No obstruction. No visible inflammation. Vascular/Lymphatic: No acute vascular abnormality. No mass or adenopathy. Reproductive:No pathologic findings. Other: No ascites or pneumoperitoneum. Musculoskeletal: No acute abnormalities. Asymmetric segmentation at the lumbosacral junction with levoscoliosis. IMPRESSION: 1. No hydronephrosis or ureteral calculus. 2. Left more than right nephrolithiasis. Calculi  appear progressed on the left since December 2022. 3. Hepatic steatosis. 4. 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 Electronically Signed   By: Tiburcio Pea M.D.   On: 01/15/2022 05:19   ____________________________________________  PROCEDURES  Procedure(s) performed:   Procedures ____________________________________________  INITIAL IMPRESSION / ASSESSMENT AND PLAN   This patient presents to the ED for concern of left flank pain, this involves an extensive number of treatment options, and is a complaint that carries with it a high risk of complications and morbidity.  The differential diagnosis includes most likely kidney stone based on his history but could also consider diverticulitis, gastritis, colitis, enteritis. Initial plan to check urine, labs and treat symptoms. Will try to avoid ct scan if c/w previous stones and able to get symptoms controlled.   Additional history obtained:  Additional history obtained from no one Previous records obtained and reviewed in epic  Co morbidities that complicate the patient evaluation  N/A  Social Determinants of Health:  N/A  ED Course  Images ordered viewed and obtained by myself. Agree with Radiology  interpretation. Details in ED course.  Labs ordered reviewed by myself as detailed in ED course.  Consultations obtained/considered detailed in ED course.   Clinical Course as of 01/15/22 0636  Fri Jan 15, 2022  0424 Urinalysis, Routine w reflex microscopic Urine, Clean Catch Significantly delayed per lab issues [JM]  0424 Sodium(!): 132 [JM]  0425 Glucose(!): 123 [JM]  0425 Creatinine: 0.94 reassuring [JM]  0425 WBC: 7.1 [JM]  0425 Hemoglobin: 14.7 [JM]  0425 BP(!): 150/105 [JM]  0425 Reevaluation around 0400 and still having pain. Doesn't think initial pain meds helped much. Will redose and if there's a possibility of admission for pain control, will go ahead and CT for size and location  [JM]  0500 CT Renal Stone Study Reviewed by myself. Some fullness of left renal pelvis. Multiple renal stones, no obvious ureterolithiasis. There is an area of calcification deep in the pelvis but appears to be past where the ureter enters the bladder. Will await rads read.  [JM]  1914 CT Renal Sound[facility] [place] [JM]    Clinical Course User Index [JM] Rozell Theiler, Barbara Cower, MD      Cardiac Monitoring:  The patient was maintained on a cardiac monitor.  I personally viewed and interpreted the cardiac monitored which showed an underlying rhythm of: NSR  CRITICAL INTERVENTIONS:  Pain meds  Reevaluation:  After the interventions noted above, I reevaluated the patient and found that they have :improved  CT scan ultimately read by radiologist as negative for ureterolithiasis.  Does have some chronic left hydronephrosis patient states is likely from strictures related to previous procedures.  I encouraged follow-up with urology for the same.  Pain is significantly improved at this time.  Low suspicion for diverticulitis, colitis or other emergent causes for symptoms at this time.  FINAL IMPRESSION AND PLAN Final diagnoses:  Flank pain   A medical screening exam was performed and I feel the patient has had an  appropriate workup for their chief complaint at this time and likelihood of emergent condition existing is low. They have been counseled on decision, DISCHARGE, follow up and which symptoms necessitate immediate return to the emergency department. They or their family verbally stated understanding and agreement with plan and discharged in stable condition.   ____________________________________________   NEW OUTPATIENT MEDICATIONS STARTED DURING THIS VISIT:  New Prescriptions   No medications on file    Note:  This note was prepared with assistance of  [employer] training and education officer. Occasional wrong-word or sound-a-like substitutions may have occurred due to the inherent limitations of voice recognition software.    Carnie Bruemmer, Corene Cornea, MD 01/15/22 [phone]

## 2022-01-23 ENCOUNTER — Emergency Department (HOSPITAL_COMMUNITY)
Admission: EM | Admit: 2022-01-23 | Discharge: 2022-01-23 | Disposition: A | Discharge status: Home / Self Care | Payer: BC Managed Care – PPO | Attending: Emergency Medicine | Admitting: Emergency Medicine

## 2022-01-23 ENCOUNTER — Other Ambulatory Visit: Payer: Self-pay

## 2022-01-23 ENCOUNTER — Emergency Department (HOSPITAL_COMMUNITY): Payer: BC Managed Care – PPO

## 2022-01-23 DIAGNOSIS — N200 Calculus of kidney: Secondary | ICD-10-CM | POA: Diagnosis not present

## 2022-01-23 DIAGNOSIS — R109 Unspecified abdominal pain: Secondary | ICD-10-CM | POA: Insufficient documentation

## 2022-01-23 LAB — CBC WITH DIFFERENTIAL/PLATELET
Abs Immature Granulocytes: 0.02 10*3/uL (ref 0.00–0.07)
Basophils Absolute: 0.1 10*3/uL (ref 0.0–0.1)
Basophils Relative: 1 %
Eosinophils Absolute: 0.2 10*3/uL (ref 0.0–0.5)
Eosinophils Relative: 2 %
HCT: 43.5 % (ref 39.0–52.0)
Hemoglobin: 15 g/dL (ref 13.0–17.0)
Immature Granulocytes: 0 %
Lymphocytes Relative: 46 %
Lymphs Abs: 3.5 10*3/uL (ref 0.7–4.0)
MCH: 29.3 pg (ref 26.0–34.0)
MCHC: 34.5 g/dL (ref 30.0–36.0)
MCV: 85 fL (ref 80.0–100.0)
Monocytes Absolute: 0.6 10*3/uL (ref 0.1–1.0)
Monocytes Relative: 7 %
Neutro Abs: 3.5 10*3/uL (ref 1.7–7.7)
Neutrophils Relative %: 44 %
Platelets: 209 10*3/uL (ref 150–400)
RBC: 5.12 MIL/uL (ref 4.22–5.81)
RDW: 13 % (ref 11.5–15.5)
WBC: 7.8 10*3/uL (ref 4.0–10.5)
nRBC: 0 % (ref 0.0–0.2)

## 2022-01-23 LAB — URINALYSIS, ROUTINE W REFLEX MICROSCOPIC
Bacteria, UA: NONE SEEN
Bilirubin Urine: NEGATIVE
Glucose, UA: NEGATIVE mg/dL
Ketones, ur: NEGATIVE mg/dL
Leukocytes,Ua: NEGATIVE
Nitrite: NEGATIVE
Protein, ur: NEGATIVE mg/dL
Specific Gravity, Urine: 1.025 (ref 1.005–1.030)
pH: 6 (ref 5.0–8.0)

## 2022-01-23 LAB — BASIC METABOLIC PANEL
Anion gap: 8 (ref 5–15)
BUN: 19 mg/dL (ref 6–20)
CO2: 25 mmol/L (ref 22–32)
Calcium: 9.2 mg/dL (ref 8.9–10.3)
Chloride: 104 mmol/L (ref 98–111)
Creatinine, Ser: 0.94 mg/dL (ref 0.61–1.24)
GFR, Estimated: >60 mL/min (ref >60)
Glucose, Bld: 91 mg/dL (ref 70–99)
Potassium: 3.7 mmol/L (ref 3.5–5.1)
Sodium: 137 mmol/L (ref 135–145)

## 2022-01-23 MED ORDER — TAMSULOSIN HCL 0.4 MG PO CAPS: 0.4000 mg | ORAL_CAPSULE | Freq: Every day | ORAL | 0 refills | Status: DC

## 2022-01-23 MED ORDER — MORPHINE SULFATE (PF) 4 MG/ML IV SOLN
4.0000 mg | Freq: Once | INTRAVENOUS | Status: AC
  Administered 2022-01-23: 4 mg via INTRAVENOUS
  Filled 2022-01-23: qty 1

## 2022-01-23 MED ORDER — KETOROLAC TROMETHAMINE 30 MG/ML IJ SOLN
15.0000 mg | Freq: Once | INTRAMUSCULAR | Status: AC
  Administered 2022-01-23: 15 mg via INTRAVENOUS
  Filled 2022-01-23: qty 1

## 2022-01-23 MED ORDER — SODIUM CHLORIDE 0.9 % IV BOLUS
1000.0000 mL | Freq: Once | INTRAVENOUS | Status: AC
  Administered 2022-01-23: 1000 mL via INTRAVENOUS

## 2022-01-23 MED ORDER — OXYCODONE HCL 5 MG PO TABS
5.0000 mg | ORAL_TABLET | Freq: Four times a day (QID) | ORAL | 0 refills | Status: DC | PRN
Start: 2022-01-23 — End: 2022-01-27

## 2022-01-23 MED ORDER — ONDANSETRON HCL 4 MG/2ML IJ SOLN
4.0000 mg | Freq: Once | INTRAMUSCULAR | Status: AC
  Administered 2022-01-23: 4 mg via INTRAVENOUS
  Filled 2022-01-23: qty 2

## 2022-01-23 NOTE — ED Provider Notes (Signed)
[facility]-EMERGENCY DEPT Provider Note   CSN: [id number] Arrival date & time: 01/23/22  8341     History  Chief Complaint  Patient presents with   Flank Pain    Thomas Howell is a 31 y.o. male.  31 year old male with medical history as detailed below presents for evaluation.  Patient with longstanding history of renal colic.  Patient is known to Dr. Mena Goes with urology.  Patient reports continued left flank discomfort after being seen for similar complaint last week.  Patient denies fever.  Patient denies nausea or vomiting.  Patient reports taking home narcotic pain medicine with minimal relief  The history is provided by the patient.  Flank Pain This is a recurrent problem. The current episode started more than 2 days ago. The problem occurs daily. The problem has not changed since onset.Pertinent negatives include no chest pain and no abdominal pain. Nothing aggravates the symptoms. Nothing relieves the symptoms.      Home Medications Prior to Admission medications   Medication Sig Start Date End Date Taking? Authorizing Provider  hydrochlorothiazide (HYDRODIURIL) 25 MG tablet Take 25 mg by mouth daily. For kidney stone prevention    [provider]  hydrOXYzine (ATARAX/VISTARIL) 10 MG tablet Take 1/2-1 tab po qd prn panic attacks and 1 tab po qhs prn sleep Patient taking differently: Take 10 mg by mouth daily as needed for anxiety. 06/26/21   Campbell Riches, NP  ibuprofen (ADVIL) 200 MG tablet Take 600 mg by mouth every 6 (six) hours as needed for headache, fever or mild pain.    [provider]  oxyCODONE (ROXICODONE) 5 MG immediate release tablet Take 1 tablet (5 mg total) by mouth every 4 (four) hours as needed for severe pain. 12/07/21   Sabas Sous, MD  promethazine (PHENERGAN) 12.5 MG tablet Take 12.5 mg by mouth every 6 (six) hours as needed for nausea/vomiting. Patient not taking: Reported on 11/04/2021 11/02/21    [provider]  rizatriptan (MAXALT-MLT) 10 MG disintegrating tablet DISSOLVE 1 TABLET(10 MG) ON THE TONGUE DAILY AS NEEDED FOR MIGRAINE. MAY REPEAT IN 2 HOURS AS NEEDED. MAX 2 PER 24 HOURS 12/18/21   Sherie Don C, NP  sertraline (ZOLOFT) 50 MG tablet Take 1/2 tab po qd x 6 days then one po qd Patient taking differently: Take 50 mg by mouth daily. 11/02/21   Tommie Sams, DO      Allergies    Bactrim [sulfamethoxazole-trimethoprim] and Ketamine    Review of Systems   Review of Systems  Cardiovascular:  Negative for chest pain.  Gastrointestinal:  Negative for abdominal pain.  Genitourinary:  Positive for flank pain.  All other systems reviewed and are negative.  Physical Exam Updated Vital Signs BP 125/65    Pulse 65    Temp 97.9 F (36.6 C) (Oral)    Resp 18    Ht 5\' 8"  (1.727 m)    Wt 97.5 kg    SpO2 93%    BMI 32.69 kg/m  Physical Exam Vitals and nursing note reviewed.  Constitutional:      General: He is not in acute distress.    Appearance: Normal appearance. He is well-developed.  HENT:     Head: Normocephalic and atraumatic.  Eyes:     Conjunctiva/sclera: Conjunctivae normal.     Pupils: Pupils are equal, round, and reactive to light.  Cardiovascular:     Rate and Rhythm: Normal rate and regular rhythm.  Heart sounds: Normal heart sounds.  Pulmonary:     Effort: Pulmonary effort is normal. No respiratory distress.     Breath sounds: Normal breath sounds.  Abdominal:     General: There is no distension.     Palpations: Abdomen is soft.     Tenderness: There is no abdominal tenderness.  Musculoskeletal:        General: No deformity. Normal range of motion.     Cervical back: Normal range of motion and neck supple.  Skin:    General: Skin is warm and dry.  Neurological:     General: No focal deficit present.     Mental Status: He is alert and oriented to person, place, and time.    ED Results / Procedures / Treatments   Labs (all labs  ordered are listed, but only abnormal results are displayed) Labs Reviewed  URINALYSIS, ROUTINE W REFLEX MICROSCOPIC - Abnormal; Notable for the following components:      Result Value   Hgb urine dipstick SMALL (*)    All other components within normal limits  BASIC METABOLIC PANEL  CBC WITH DIFFERENTIAL/PLATELET    EKG None  Radiology US Renal  Result Date: 01/23/2022 CLINICAL DATA:  Left flank pain EXAM: RENAL / URINARY TRACT ULTRASOUND COMPLETE COMPARISON:  CT renal stone protocol 01/15/2022 FINDINGS: Right Kidney: Renal measurements: 12.1 x 6.1 x 5.7 cm = volume: 220 mL. Echogenicity within normal limits. No mass or hydronephrosis visualized. Left Kidney: Renal measurements: 10.5 x 5.9 x 3.8 cm = volume: 122 mL. Echogenicity within normal limits. No mass or hydronephrosis visualized. 15 mm nonobstructing calculus seen in the lower pole. Bladder: Appears normal for degree of bladder distention. Other: None. IMPRESSION: 1. No hydronephrosis. 2. 15 mm nonobstructing left lower pole renal calculus. Electronically Signed   By: Miachel Roux M.D.   On: 01/23/2022 08:43    Procedures Procedures    Medications Ordered in ED Medications  morphine (PF) 4 MG/ML injection 4 mg (has no administration in time range)  sodium chloride 0.9 % bolus 1,000 mL (0 mLs Intravenous Stopped 01/23/22 0933)  ondansetron (ZOFRAN) injection 4 mg (4 mg Intravenous Given 01/23/22 0758)  ketorolac (TORADOL) 30 MG/ML injection 15 mg (15 mg Intravenous Given 01/23/22 0758)  morphine (PF) 4 MG/ML injection 4 mg (4 mg Intravenous Given 01/23/22 0759)    ED Course/ Medical Decision Making/ A&P                           Medical Decision Making Amount and/or Complexity of Data Reviewed Labs: ordered. Radiology: ordered.  Risk Prescription drug management.    Medical Screen Complete  This patient presented to the ED with complaint of left flank pain.  This complaint involves an extensive number of treatment  options. The initial differential diagnosis includes, but is not limited to, renal colic, muscular pain, metabolic abnormality  This presentation is: Chronic, Self-Limited, Previously Undiagnosed, Uncertain Prognosis, Complicated, Systemic Symptoms, and Threat to Life/Bodily Function  Patient complains of left flank discomfort  Patient with extensive history of renal colic. He is well-known to urology.  Patient's work-up today is without significant abnormality.  CBC, BMP, and UA are within normal limits.  Renal ultrasound is without evidence of significant hydro or other obstructive pathology.  Patient feels improved after administration of pain medication here in the ED.  Desires DC home.  He plans to follow-up with Dr. Junious Silk on Monday.  Co morbidities that complicated  the patient's evaluation  Multiple prior episodes of renal colic   Additional history obtained:  External records from outside sources obtained and reviewed including prior ED visits and prior Inpatient records.    Lab Tests:  I ordered and personally interpreted labs.  The pertinent results include: CBC, BMP, UA   Imaging Studies ordered:  I ordered imaging studies including renal ultrasound I independently visualized and interpreted obtained imaging which showed no hydro I agree with the radiologist interpretation.   Cardiac Monitoring:  The patient was maintained on a cardiac monitor.  I personally viewed and interpreted the cardiac monitor which showed an underlying rhythm of: NSR   Medicines ordered:  I ordered medication including pain medication for pain Reevaluation of the patient after these medicines showed that the patient: improved   Problem List / ED Course:  Flank pain   Reevaluation:  After the interventions noted above, I reevaluated the patient and found that they have: improved    Disposition:  After consideration of the diagnostic results and the patients response  to treatment, I feel that the patent would benefit from discharge and close follow-up with urology.          Final Clinical Impression(s) / ED Diagnoses Final diagnoses:  Flank pain    Rx / DC Orders ED Discharge Orders     None         Valarie Merino, MD 01/23/22 1100

## 2022-01-23 NOTE — Discharge Instructions (Signed)
Return for any problem.  ?

## 2022-01-23 NOTE — ED Notes (Signed)
Pt discharged. Instructions and prescriptions given. AAOX4. Pt in no apparent distress severe pain. The opportunity to ask questions was provided.

## 2022-01-23 NOTE — ED Triage Notes (Signed)
Patient reports he was diagnosed with kidney stones last week, pain eased up. Pain started again this morning , left flank. 10/10

## 2022-01-27 ENCOUNTER — Emergency Department (HOSPITAL_COMMUNITY)
Admission: EM | Admit: 2022-01-27 | Discharge: 2022-01-27 | Disposition: A | Discharge status: Home / Self Care | Payer: Self-pay | Attending: Emergency Medicine | Admitting: Emergency Medicine

## 2022-01-27 ENCOUNTER — Encounter (HOSPITAL_COMMUNITY): Payer: Self-pay

## 2022-01-27 ENCOUNTER — Emergency Department (HOSPITAL_COMMUNITY): Payer: Self-pay

## 2022-01-27 DIAGNOSIS — Q433 Congenital malformations of intestinal fixation: Secondary | ICD-10-CM | POA: Diagnosis not present

## 2022-01-27 DIAGNOSIS — Q8909 Congenital malformations of spleen: Secondary | ICD-10-CM | POA: Diagnosis not present

## 2022-01-27 DIAGNOSIS — R109 Unspecified abdominal pain: Secondary | ICD-10-CM | POA: Insufficient documentation

## 2022-01-27 DIAGNOSIS — R112 Nausea with vomiting, unspecified: Secondary | ICD-10-CM | POA: Insufficient documentation

## 2022-01-27 DIAGNOSIS — N200 Calculus of kidney: Secondary | ICD-10-CM | POA: Diagnosis not present

## 2022-01-27 DIAGNOSIS — M419 Scoliosis, unspecified: Secondary | ICD-10-CM | POA: Diagnosis not present

## 2022-01-27 LAB — COMPREHENSIVE METABOLIC PANEL
ALT: 22 U/L (ref 0–44)
AST: 27 U/L (ref 15–41)
Albumin: 4.9 g/dL (ref 3.5–5.0)
Alkaline Phosphatase: 72 U/L (ref 38–126)
Anion gap: 9 (ref 5–15)
BUN: 18 mg/dL (ref 6–20)
CO2: 27 mmol/L (ref 22–32)
Calcium: 9.9 mg/dL (ref 8.9–10.3)
Chloride: 99 mmol/L (ref 98–111)
Creatinine, Ser: 1.18 mg/dL (ref 0.61–1.24)
GFR, Estimated: >60 mL/min (ref >60)
Glucose, Bld: 126 mg/dL — ABNORMAL HIGH (ref 70–99)
Potassium: 3.8 mmol/L (ref 3.5–5.1)
Sodium: 135 mmol/L (ref 135–145)
Total Bilirubin: 0.7 mg/dL (ref 0.3–1.2)
Total Protein: 8.8 g/dL — ABNORMAL HIGH (ref 6.5–8.1)

## 2022-01-27 LAB — URINALYSIS, ROUTINE W REFLEX MICROSCOPIC
Bilirubin Urine: NEGATIVE
Glucose, UA: NEGATIVE mg/dL
Hgb urine dipstick: NEGATIVE
Ketones, ur: 20 mg/dL — AB
Leukocytes,Ua: NEGATIVE
Nitrite: NEGATIVE
Protein, ur: 30 mg/dL — AB
Specific Gravity, Urine: 1.02 (ref 1.005–1.030)
pH: 6 (ref 5.0–8.0)

## 2022-01-27 LAB — CBC
HCT: 48.7 % (ref 39.0–52.0)
Hemoglobin: 16.8 g/dL (ref 13.0–17.0)
MCH: 29 pg (ref 26.0–34.0)
MCHC: 34.5 g/dL (ref 30.0–36.0)
MCV: 84.1 fL (ref 80.0–100.0)
Platelets: 277 10*3/uL (ref 150–400)
RBC: 5.79 MIL/uL (ref 4.22–5.81)
RDW: 12.8 % (ref 11.5–15.5)
WBC: 7.6 10*3/uL (ref 4.0–10.5)
nRBC: 0 % (ref 0.0–0.2)

## 2022-01-27 LAB — LIPASE, BLOOD: Lipase: 24 U/L (ref 11–51)

## 2022-01-27 MED ORDER — MORPHINE SULFATE (PF) 4 MG/ML IV SOLN
4.0000 mg | Freq: Once | INTRAVENOUS | Status: AC
  Administered 2022-01-27: 4 mg via INTRAVENOUS
  Filled 2022-01-27: qty 1

## 2022-01-27 MED ORDER — PROMETHAZINE HCL 25 MG PO TABS: 25.0000 mg | ORAL_TABLET | Freq: Three times a day (TID) | ORAL | 0 refills | Status: DC | PRN

## 2022-01-27 MED ORDER — ONDANSETRON HCL 4 MG/2ML IJ SOLN
4.0000 mg | Freq: Once | INTRAMUSCULAR | Status: AC
  Administered 2022-01-27: 4 mg via INTRAVENOUS
  Filled 2022-01-27: qty 2

## 2022-01-27 MED ORDER — MORPHINE SULFATE (PF) 4 MG/ML IV SOLN
4.0000 mg | Freq: Once | INTRAVENOUS | Status: AC | PRN
  Administered 2022-01-27: 4 mg via INTRAVENOUS
  Filled 2022-01-27: qty 1

## 2022-01-27 MED ORDER — KETOROLAC TROMETHAMINE 15 MG/ML IJ SOLN
15.0000 mg | Freq: Once | INTRAMUSCULAR | Status: AC
  Administered 2022-01-27: 15 mg via INTRAVENOUS
  Filled 2022-01-27: qty 1

## 2022-01-27 MED ORDER — HYDROMORPHONE HCL 1 MG/ML IJ SOLN
1.0000 mg | Freq: Once | INTRAMUSCULAR | Status: AC
  Administered 2022-01-27: 1 mg via INTRAVENOUS
  Filled 2022-01-27: qty 1

## 2022-01-27 MED ORDER — LACTATED RINGERS IV BOLUS
1000.0000 mL | Freq: Once | INTRAVENOUS | Status: AC
  Administered 2022-01-27: 1000 mL via INTRAVENOUS

## 2022-01-27 MED ORDER — SODIUM CHLORIDE 0.9 % IV SOLN
25.0000 mg | Freq: Four times a day (QID) | INTRAVENOUS | Status: DC | PRN
  Filled 2022-01-27: qty 1

## 2022-01-27 NOTE — ED Provider Triage Note (Signed)
Emergency Medicine Provider Triage Evaluation Note  Thomas Howell , a 31 y.o. male  was evaluated in triage.  Pt complains of left flank pain. Long hx of renal colic. Sx since the weekend. Comes and goes. Worse ysterday morning.   Flomax and toradol at home.   Review of Systems  Positive: L flank pain Negative: Fever   Physical Exam  BP (!) 141/102 (BP Location: Right Arm)    Pulse (!) 118    Temp 97.7 F (36.5 C) (Oral)    Resp 18    SpO2 98%  Gen:   Awake, uncomfortable Resp:  Normal effort  MSK:   Moves extremities without difficulty  Other:    Medical Decision Making  Medically screening exam initiated at 11:11 AM.  Appropriate orders placed.  Thomas Howell was informed that the remainder of the evaluation will be completed by another provider, this initial triage assessment does not replace that evaluation, and the importance of remaining in the ED until their evaluation is complete.  Reviewed CT from 01/15/22 - no stone.  CT 12/06/21 shows two stones.    Thomas Howell, [place] 01/27/22 1113

## 2022-01-27 NOTE — ED Triage Notes (Signed)
Pt presents with c/o left flank pain. Pt reports that he has been in and out of the ER several times over the last week and has a confirmed kidney stone in the left kidney. Pt also reports multiple surgeries on that kidney. Pt is diaphoretic in triage, vomiting as well.

## 2022-01-27 NOTE — ED Provider Notes (Signed)
[facility]-EMERGENCY DEPT Provider Note   CSN: [id number] Arrival date & time: 01/27/22  1057     History  Chief Complaint  Patient presents with   Flank Pain    Thomas Howell is a 31 y.o. male.   Flank Pain Patient presents for worsening left-sided flank pain.  Per chart review, his medical history is notable for previous nephrolithiasis, hydronephrosis, pyelonephritis, SVT, ADHD, and migraine headaches.  He has been seen in the ED multiple times recently.  Most recently this was 4 days ago.  At that time, he underwent a renal ultrasound which showed nonobstructing left lower pole calculus and no hydronephrosis.  He received pain medication with subsequent improvement and discharged with plans for urology follow-up.  Today he reports recurrence of pain in his left flank.  He has not spoken with urology since his last ED visit.  [facility] has been followed by [facility] in [facility] past but states that he is currently seeking care with [facility] for second opinions.  He has his first appointment scheduled for April.  He has previously had a left ureteral stent but this has been removed.  He reports that he passed several kidney stones yesterday.  He has had worsening left flank pain.  He has had nausea and vomiting.     Home Medications Prior to Admission medications   Medication Sig Start Date End Date Taking? Authorizing Provider  hydrochlorothiazide (HYDRODIURIL) 25 MG tablet Take 25 mg by mouth daily.   Yes [provider]  ketorolac (TORADOL) 10 MG tablet Take 10 mg by mouth every 8 (eight) hours as needed for pain. 01/15/22  Yes [provider]  promethazine (PHENERGAN) 25 MG tablet Take 1 tablet (25 mg total) by mouth every 8 (eight) hours as needed for up to 20 doses for nausea or vomiting. 01/27/22  Yes Gloris Manchester, MD  rizatriptan (MAXALT-MLT) 10 MG disintegrating tablet DISSOLVE 1 TABLET(10 MG) ON THE TONGUE DAILY AS NEEDED FOR MIGRAINE. MAY  REPEAT IN 2 HOURS AS NEEDED. MAX 2 PER 24 HOURS Patient taking differently: Take 10 mg by mouth 2 (two) times daily as needed for migraine. 12/18/21  Yes Campbell Riches, NP  sertraline (ZOLOFT) 50 MG tablet Take 1/2 tab po qd x 6 days then one po qd Patient taking differently: Take 50 mg by mouth daily. 11/02/21  Yes Cook, Jayce G, DO  tamsulosin (FLOMAX) 0.4 MG CAPS capsule Take 1 capsule (0.4 mg total) by mouth daily. 01/23/22  Yes Wynetta Fines, MD  naloxone [facility]) nasal spray 4 mg/0.1 mL Place 1 spray into the nose as directed. For overdose Patient not taking: Reported on 01/27/2022 12/08/21   [provider]  Oxycodone HCl 10 MG TABS Take 10 mg by mouth 3 (three) times daily. Patient not taking: Reported on 01/27/2022 01/05/22   [provider]      Allergies    Bactrim [sulfamethoxazole-trimethoprim] and Ketamine    Review of Systems   Review of Systems  Gastrointestinal:  Positive for nausea and vomiting.  Genitourinary:  Positive for flank pain.  All other systems reviewed and are negative.  Physical Exam Updated Vital Signs BP 121/88    Pulse 85    Temp 97.7 F (36.5 C) (Oral)    Resp 16    SpO2 93%  Physical Exam Vitals and nursing note reviewed.  Constitutional:      General: He is not in acute distress.    Appearance: Normal appearance. He  is well-developed. He is not ill-appearing, toxic-appearing or diaphoretic.  HENT:     Head: Normocephalic and atraumatic.     Right Ear: External ear normal.     Left Ear: External ear normal.     Nose: Nose normal.     Mouth/Throat:     Mouth: Mucous membranes are moist.     Pharynx: Oropharynx is clear.  Eyes:     Extraocular Movements: Extraocular movements intact.     Conjunctiva/sclera: Conjunctivae normal.  Cardiovascular:     Rate and Rhythm: Normal rate and regular rhythm.     Heart sounds: No murmur heard. Pulmonary:     Effort: Pulmonary effort is normal. No respiratory distress.     Breath  sounds: Normal breath sounds.  Abdominal:     Palpations: Abdomen is soft.     Tenderness: There is no abdominal tenderness. There is left CVA tenderness. There is no right CVA tenderness or guarding.  Musculoskeletal:        General: No swelling. Normal range of motion.     Cervical back: Normal range of motion and neck supple. No rigidity.     Right lower leg: No edema.     Left lower leg: No edema.  Skin:    General: Skin is warm and dry.     Capillary Refill: Capillary refill takes less than 2 seconds.     Coloration: Skin is not jaundiced or pale.  Neurological:     General: No focal deficit present.     Mental Status: He is alert and oriented to person, place, and time.     Cranial Nerves: No cranial nerve deficit.     Sensory: No sensory deficit.     Motor: No weakness.     Coordination: Coordination normal.  Psychiatric:        Mood and Affect: Mood normal.        Behavior: Behavior normal.        Thought Content: Thought content normal.        Judgment: Judgment normal.    ED Results / Procedures / Treatments   Labs (all labs ordered are listed, but only abnormal results are displayed) Labs Reviewed  COMPREHENSIVE METABOLIC PANEL - Abnormal; Notable for the following components:      Result Value   Glucose, Bld 126 (*)    Total Protein 8.8 (*)    All other components within normal limits  URINALYSIS, ROUTINE W REFLEX MICROSCOPIC - Abnormal; Notable for the following components:   Ketones, ur 20 (*)    Protein, ur 30 (*)    Bacteria, UA RARE (*)    All other components within normal limits  LIPASE, BLOOD  CBC    EKG None  Radiology No results found.  Procedures Procedures    Medications Ordered in ED Medications  lactated ringers bolus 1,000 mL (0 mLs Intravenous Stopped 01/27/22 1533)  ondansetron (ZOFRAN) injection 4 mg (4 mg Intravenous Given 01/27/22 1210)  morphine (PF) 4 MG/ML injection 4 mg (4 mg Intravenous Given 01/27/22 1210)  morphine (PF) 4  MG/ML injection 4 mg (4 mg Intravenous Given 01/27/22 1259)  ketorolac (TORADOL) 15 MG/ML injection 15 mg (15 mg Intravenous Given 01/27/22 1351)  HYDROmorphone (DILAUDID) injection 1 mg (1 mg Intravenous Given 01/27/22 1351)    ED Course/ Medical Decision Making/ A&P                           Medical  Decision Making Risk Prescription drug management.   This patient presents to the ED for concern of left flank pain, this involves an extensive number of treatment options, and is a complaint that carries with it a high risk of complications and morbidity.  The differential diagnosis includes nephrolithiasis, pyelonephritis, obstructive uropathy, opioid addiction, drug-seeking behavior   Co morbidities that complicate the patient evaluation  Previous episodes of nephrolithiasis, hydronephrosis, pyelonephritis, SVT, ADHD, and migraine headaches   Additional history obtained:  Additional history obtained from patient's aunt External records from outside source obtained and reviewed including EMR   Lab Tests:  I Ordered, and personally interpreted labs.  The pertinent results include: Normal findings   Imaging Studies ordered:  Imaging studies including CT stone study I independently visualized and interpreted imaging which showed nonobstructive nephrolithiasis only.  No periureteral stranding to suggest recent passage of stone. I agree with the radiologist interpretation   Cardiac Monitoring:  The patient was maintained on a cardiac monitor.  I personally viewed and interpreted the cardiac monitored which showed an underlying rhythm of: Sinus rhythm   Medicines ordered and prescription drug management:  I ordered medication including IV fluids for recent nausea and vomiting, Zofran for antiemetic, morphine, Dilaudid, and Toradol for analgesia. Reevaluation of the patient after these medicines showed that the patient improved I have reviewed the patients home medicines and have  made adjustments as needed   Problem List / ED Course:  31 year old male with history of prior episodes of kidney stones, pyelonephritis, hydronephrosis, presenting for left flank pain.  He was most recently seen in the ED for the same 4 days ago.  Triage evaluation noted diaphoresis and vomiting in the waiting room.  On my initial assessment, patient is well-appearing.  Endorses tenderness to the area of his left CVA.  Prior to being bedded in the ED, diagnostic work-up was initiated.  Patient given IV fluids, Zofran, and morphine.  He endorsed continued pain and was given an additional dose of morphine.  That point, he stated that "Dilaudid was the only thing that works for him".  Dose of Dilaudid were given along with Toradol.  Patient's work-up is reassuring.  He has nonobstructive stones in his kidneys only.  There is no perinephric or periureteral stranding.  He has no evidence of urine infection.  He has only 6-10 RBCs/HPF.  I spoke with the patient about his reassuring work-up.  Patient does feel comfortable with discharge home at this time.  He asked if he could get another dose of Dilaudid before he left.  Patient was informed that these medications are strong narcotics and they are not appropriate to give prior to discharge.  At this point, I reviewed his PDMP.  Patient has a overdose risk score of 550.  3 weeks ago, he was prescribed 90 10mg  tablets of oxycodone.  He was prescribed additional narcotics 4 days ago. Patient was encouraged to speak with his primary care doctor about chronic pain management.  No narcotics to be prescribed today in the ED.  He was given prescription for Phenergan for symptomatic relief of nausea at home.  Patient is stable for discharge at this time.   Reevaluation:  After the interventions noted above, I reevaluated the patient and found that they have :improved   Social Determinants of Health:  Frequent emergency department visits   Dispostion:  After  consideration of the diagnostic results and the patients response to treatment, I feel that the patent would benefit from discharge with  PCP follow-up.          Final Clinical Impression(s) / ED Diagnoses Final diagnoses:  Left flank pain    Rx / DC Orders ED Discharge Orders          Ordered    promethazine (PHENERGAN) 25 MG tablet  Every 8 hours PRN        01/27/22 1518              Gloris Manchester, MD 01/29/22 1610

## 2022-03-05 DIAGNOSIS — Z79891 Long term (current) use of opiate analgesic: Secondary | ICD-10-CM | POA: Diagnosis not present

## 2022-03-30 DIAGNOSIS — M546 Pain in thoracic spine: Secondary | ICD-10-CM | POA: Diagnosis not present

## 2022-03-30 DIAGNOSIS — Z79891 Long term (current) use of opiate analgesic: Secondary | ICD-10-CM | POA: Diagnosis not present

## 2022-03-30 DIAGNOSIS — F4542 Pain disorder with related psychological factors: Secondary | ICD-10-CM | POA: Diagnosis not present

## 2022-03-30 DIAGNOSIS — G8929 Other chronic pain: Secondary | ICD-10-CM | POA: Diagnosis not present

## 2022-04-07 DIAGNOSIS — N200 Calculus of kidney: Secondary | ICD-10-CM | POA: Diagnosis not present

## 2022-04-12 DIAGNOSIS — R1110 Vomiting, unspecified: Secondary | ICD-10-CM | POA: Diagnosis not present

## 2022-04-12 DIAGNOSIS — G8929 Other chronic pain: Secondary | ICD-10-CM | POA: Diagnosis not present

## 2022-04-12 DIAGNOSIS — Z882 Allergy status to sulfonamides status: Secondary | ICD-10-CM | POA: Diagnosis not present

## 2022-04-12 DIAGNOSIS — N200 Calculus of kidney: Secondary | ICD-10-CM | POA: Diagnosis not present

## 2022-04-12 DIAGNOSIS — N1330 Unspecified hydronephrosis: Secondary | ICD-10-CM | POA: Diagnosis not present

## 2022-04-12 DIAGNOSIS — R109 Unspecified abdominal pain: Secondary | ICD-10-CM | POA: Diagnosis not present

## 2022-04-15 DIAGNOSIS — N200 Calculus of kidney: Secondary | ICD-10-CM | POA: Diagnosis not present

## 2022-04-15 DIAGNOSIS — N2889 Other specified disorders of kidney and ureter: Secondary | ICD-10-CM | POA: Diagnosis not present

## 2022-04-15 DIAGNOSIS — R944 Abnormal results of kidney function studies: Secondary | ICD-10-CM | POA: Diagnosis not present

## 2022-04-27 DIAGNOSIS — G8929 Other chronic pain: Secondary | ICD-10-CM | POA: Diagnosis not present

## 2022-04-27 DIAGNOSIS — F4542 Pain disorder with related psychological factors: Secondary | ICD-10-CM | POA: Diagnosis not present

## 2022-04-27 DIAGNOSIS — Z79891 Long term (current) use of opiate analgesic: Secondary | ICD-10-CM | POA: Diagnosis not present

## 2022-04-27 DIAGNOSIS — M546 Pain in thoracic spine: Secondary | ICD-10-CM | POA: Diagnosis not present

## 2022-04-30 ENCOUNTER — Telehealth: Payer: BC Managed Care – PPO | Admitting: Nurse Practitioner

## 2022-05-04 ENCOUNTER — Encounter (HOSPITAL_COMMUNITY)
Admission: RE | Disposition: A | Discharge status: Home / Self Care | Payer: Self-pay | Source: Ambulatory Visit | Attending: Urology

## 2022-05-04 ENCOUNTER — Ambulatory Visit (HOSPITAL_COMMUNITY): Payer: BC Managed Care – PPO

## 2022-05-04 ENCOUNTER — Encounter (HOSPITAL_COMMUNITY): Payer: Self-pay | Admitting: Urology

## 2022-05-04 ENCOUNTER — Other Ambulatory Visit: Payer: Self-pay | Admitting: Urology

## 2022-05-04 ENCOUNTER — Encounter (HOSPITAL_COMMUNITY): Payer: Self-pay

## 2022-05-04 ENCOUNTER — Ambulatory Visit (HOSPITAL_COMMUNITY): Payer: BC Managed Care – PPO | Admitting: Anesthesiology

## 2022-05-04 ENCOUNTER — Ambulatory Visit (HOSPITAL_COMMUNITY)
Admission: RE | Admit: 2022-05-04 | Discharge: 2022-05-04 | Disposition: A | Discharge status: Home / Self Care | Payer: BC Managed Care – PPO | Source: Ambulatory Visit | Attending: Urology | Admitting: Urology

## 2022-05-04 ENCOUNTER — Other Ambulatory Visit: Payer: Self-pay

## 2022-05-04 ENCOUNTER — Emergency Department (HOSPITAL_COMMUNITY): Payer: BC Managed Care – PPO

## 2022-05-04 ENCOUNTER — Emergency Department (HOSPITAL_COMMUNITY)
Admission: EM | Admit: 2022-05-04 | Discharge: 2022-05-04 | Disposition: A | Discharge status: Home / Self Care | Payer: BC Managed Care – PPO | Source: Home / Self Care | Attending: Emergency Medicine | Admitting: Emergency Medicine

## 2022-05-04 DIAGNOSIS — D72829 Elevated white blood cell count, unspecified: Secondary | ICD-10-CM | POA: Insufficient documentation

## 2022-05-04 DIAGNOSIS — Z87442 Personal history of urinary calculi: Secondary | ICD-10-CM | POA: Insufficient documentation

## 2022-05-04 DIAGNOSIS — R82994 Hypercalciuria: Secondary | ICD-10-CM | POA: Diagnosis not present

## 2022-05-04 DIAGNOSIS — N202 Calculus of kidney with calculus of ureter: Secondary | ICD-10-CM | POA: Diagnosis not present

## 2022-05-04 DIAGNOSIS — N132 Hydronephrosis with renal and ureteral calculous obstruction: Secondary | ICD-10-CM | POA: Diagnosis not present

## 2022-05-04 DIAGNOSIS — N200 Calculus of kidney: Secondary | ICD-10-CM | POA: Diagnosis not present

## 2022-05-04 DIAGNOSIS — N1330 Unspecified hydronephrosis: Secondary | ICD-10-CM | POA: Diagnosis not present

## 2022-05-04 DIAGNOSIS — N179 Acute kidney failure, unspecified: Secondary | ICD-10-CM | POA: Insufficient documentation

## 2022-05-04 DIAGNOSIS — K760 Fatty (change of) liver, not elsewhere classified: Secondary | ICD-10-CM | POA: Diagnosis not present

## 2022-05-04 DIAGNOSIS — R1032 Left lower quadrant pain: Secondary | ICD-10-CM | POA: Diagnosis not present

## 2022-05-04 DIAGNOSIS — F419 Anxiety disorder, unspecified: Secondary | ICD-10-CM | POA: Diagnosis not present

## 2022-05-04 DIAGNOSIS — N23 Unspecified renal colic: Secondary | ICD-10-CM | POA: Diagnosis not present

## 2022-05-04 DIAGNOSIS — Z87891 Personal history of nicotine dependence: Secondary | ICD-10-CM | POA: Insufficient documentation

## 2022-05-04 DIAGNOSIS — R109 Unspecified abdominal pain: Secondary | ICD-10-CM

## 2022-05-04 DIAGNOSIS — N134 Hydroureter: Secondary | ICD-10-CM | POA: Diagnosis not present

## 2022-05-04 HISTORY — PX: CYSTOSCOPY W/ URETERAL STENT PLACEMENT: SHX1429

## 2022-05-04 LAB — URINALYSIS, ROUTINE W REFLEX MICROSCOPIC
Bacteria, UA: NONE SEEN
Bilirubin Urine: NEGATIVE
Glucose, UA: NEGATIVE mg/dL
Ketones, ur: 80 mg/dL — AB
Leukocytes,Ua: NEGATIVE
Nitrite: NEGATIVE
Protein, ur: 30 mg/dL — AB
Specific Gravity, Urine: 1.028 (ref 1.005–1.030)
pH: 5 (ref 5.0–8.0)

## 2022-05-04 LAB — CBC WITH DIFFERENTIAL/PLATELET
Abs Immature Granulocytes: 0.03 10*3/uL (ref 0.00–0.07)
Basophils Absolute: 0 10*3/uL (ref 0.0–0.1)
Basophils Relative: 0 %
Eosinophils Absolute: 0 10*3/uL (ref 0.0–0.5)
Eosinophils Relative: 0 %
HCT: 46.3 % (ref 39.0–52.0)
Hemoglobin: 16.2 g/dL (ref 13.0–17.0)
Immature Granulocytes: 0 %
Lymphocytes Relative: 8 %
Lymphs Abs: 1 10*3/uL (ref 0.7–4.0)
MCH: 29.7 pg (ref 26.0–34.0)
MCHC: 35 g/dL (ref 30.0–36.0)
MCV: 84.8 fL (ref 80.0–100.0)
Monocytes Absolute: 0.5 10*3/uL (ref 0.1–1.0)
Monocytes Relative: 4 %
Neutro Abs: 10.6 10*3/uL — ABNORMAL HIGH (ref 1.7–7.7)
Neutrophils Relative %: 88 %
Platelets: 222 10*3/uL (ref 150–400)
RBC: 5.46 MIL/uL (ref 4.22–5.81)
RDW: 12.3 % (ref 11.5–15.5)
WBC: 12.2 10*3/uL — ABNORMAL HIGH (ref 4.0–10.5)
nRBC: 0 % (ref 0.0–0.2)

## 2022-05-04 LAB — COMPREHENSIVE METABOLIC PANEL
ALT: 30 U/L (ref 0–44)
AST: 28 U/L (ref 15–41)
Albumin: 4.6 g/dL (ref 3.5–5.0)
Alkaline Phosphatase: 64 U/L (ref 38–126)
Anion gap: 14 (ref 5–15)
BUN: 27 mg/dL — ABNORMAL HIGH (ref 6–20)
CO2: 20 mmol/L — ABNORMAL LOW (ref 22–32)
Calcium: 9.7 mg/dL (ref 8.9–10.3)
Chloride: 104 mmol/L (ref 98–111)
Creatinine, Ser: 1.37 mg/dL — ABNORMAL HIGH (ref 0.61–1.24)
GFR, Estimated: >60 mL/min (ref >60)
Glucose, Bld: 123 mg/dL — ABNORMAL HIGH (ref 70–99)
Potassium: 3.8 mmol/L (ref 3.5–5.1)
Sodium: 138 mmol/L (ref 135–145)
Total Bilirubin: 1 mg/dL (ref 0.3–1.2)
Total Protein: 8.5 g/dL — ABNORMAL HIGH (ref 6.5–8.1)

## 2022-05-04 LAB — LIPASE, BLOOD: Lipase: 25 U/L (ref 11–51)

## 2022-05-04 SURGERY — CYSTOSCOPY, WITH RETROGRADE PYELOGRAM AND URETERAL STENT INSERTION
Anesthesia: General | Laterality: Bilateral

## 2022-05-04 MED ORDER — MIDAZOLAM HCL 2 MG/2ML IJ SOLN
INTRAMUSCULAR | Status: AC
  Filled 2022-05-04: qty 2

## 2022-05-04 MED ORDER — OXYCODONE HCL 5 MG/5ML PO SOLN: 5.0000 mg | Freq: Once | ORAL | Status: AC | PRN

## 2022-05-04 MED ORDER — IOHEXOL 300 MG/ML  SOLN
INTRAMUSCULAR | Status: DC | PRN
  Administered 2022-05-04: 20 mL via URETHRAL

## 2022-05-04 MED ORDER — OXYBUTYNIN CHLORIDE 5 MG PO TABS: 5.0000 mg | ORAL_TABLET | Freq: Three times a day (TID) | ORAL | 1 refills | Status: DC | PRN

## 2022-05-04 MED ORDER — FENTANYL CITRATE (PF) 250 MCG/5ML IJ SOLN
INTRAMUSCULAR | Status: DC | PRN
Start: 2022-05-04 — End: 2022-05-04
  Administered 2022-05-04: 100 ug via INTRAVENOUS

## 2022-05-04 MED ORDER — HYDROMORPHONE HCL 1 MG/ML IJ SOLN
1.0000 mg | Freq: Once | INTRAMUSCULAR | Status: AC
  Administered 2022-05-04: 1 mg via INTRAVENOUS
  Filled 2022-05-04: qty 1

## 2022-05-04 MED ORDER — ONDANSETRON HCL 4 MG/2ML IJ SOLN
INTRAMUSCULAR | Status: AC
  Filled 2022-05-04: qty 2

## 2022-05-04 MED ORDER — KETOROLAC TROMETHAMINE 15 MG/ML IJ SOLN
15.0000 mg | Freq: Once | INTRAMUSCULAR | Status: AC
  Administered 2022-05-04: 15 mg via INTRAVENOUS
  Filled 2022-05-04: qty 1

## 2022-05-04 MED ORDER — CEFAZOLIN SODIUM-DEXTROSE 2-4 GM/100ML-% IV SOLN
INTRAVENOUS | Status: AC
  Filled 2022-05-04: qty 100

## 2022-05-04 MED ORDER — PROCHLORPERAZINE EDISYLATE 10 MG/2ML IJ SOLN
10.0000 mg | Freq: Once | INTRAMUSCULAR | Status: AC
  Administered 2022-05-04: 10 mg via INTRAVENOUS
  Filled 2022-05-04: qty 2

## 2022-05-04 MED ORDER — AMISULPRIDE (ANTIEMETIC) 5 MG/2ML IV SOLN: 10.0000 mg | Freq: Once | INTRAVENOUS | Status: DC | PRN

## 2022-05-04 MED ORDER — FENTANYL CITRATE PF 50 MCG/ML IJ SOSY
PREFILLED_SYRINGE | INTRAMUSCULAR | Status: AC
  Filled 2022-05-04: qty 1

## 2022-05-04 MED ORDER — PROPOFOL 10 MG/ML IV BOLUS
INTRAVENOUS | Status: DC | PRN
  Administered 2022-05-04: 200 mg via INTRAVENOUS

## 2022-05-04 MED ORDER — OXYCODONE HCL 5 MG PO TABS
ORAL_TABLET | ORAL | Status: AC
  Filled 2022-05-04: qty 1

## 2022-05-04 MED ORDER — LACTATED RINGERS IV SOLN: INTRAVENOUS | Status: DC | PRN

## 2022-05-04 MED ORDER — LIDOCAINE HCL (PF) 2 % IJ SOLN
INTRAMUSCULAR | Status: AC
  Filled 2022-05-04: qty 5

## 2022-05-04 MED ORDER — FENTANYL CITRATE (PF) 250 MCG/5ML IJ SOLN
INTRAMUSCULAR | Status: AC
  Filled 2022-05-04: qty 5

## 2022-05-04 MED ORDER — ONDANSETRON HCL 4 MG/2ML IJ SOLN
4.0000 mg | Freq: Once | INTRAMUSCULAR | Status: AC
  Administered 2022-05-04: 4 mg via INTRAVENOUS
  Filled 2022-05-04: qty 2

## 2022-05-04 MED ORDER — ACETAMINOPHEN 500 MG PO TABS
1000.0000 mg | ORAL_TABLET | Freq: Once | ORAL | Status: AC
  Administered 2022-05-04: 1000 mg via ORAL
  Filled 2022-05-04: qty 2

## 2022-05-04 MED ORDER — FENTANYL CITRATE PF 50 MCG/ML IJ SOSY
25.0000 ug | PREFILLED_SYRINGE | INTRAMUSCULAR | Status: DC | PRN
  Administered 2022-05-04: 50 ug via INTRAVENOUS

## 2022-05-04 MED ORDER — ONDANSETRON HCL 4 MG/2ML IJ SOLN
INTRAMUSCULAR | Status: DC | PRN
  Administered 2022-05-04: 4 mg via INTRAVENOUS

## 2022-05-04 MED ORDER — STERILE WATER FOR IRRIGATION IR SOLN
Status: DC | PRN
  Administered 2022-05-04: 3000 mL

## 2022-05-04 MED ORDER — CEFAZOLIN SODIUM-DEXTROSE 2-3 GM-%(50ML) IV SOLR
INTRAVENOUS | Status: DC | PRN
  Administered 2022-05-04: 2 g via INTRAVENOUS

## 2022-05-04 MED ORDER — ONDANSETRON HCL 4 MG/2ML IJ SOLN: 4.0000 mg | Freq: Once | INTRAMUSCULAR | Status: DC | PRN

## 2022-05-04 MED ORDER — MIDAZOLAM HCL 2 MG/2ML IJ SOLN
INTRAMUSCULAR | Status: DC | PRN
  Administered 2022-05-04: 2 mg via INTRAVENOUS

## 2022-05-04 MED ORDER — DEXAMETHASONE SODIUM PHOSPHATE 10 MG/ML IJ SOLN
INTRAMUSCULAR | Status: AC
  Filled 2022-05-04: qty 1

## 2022-05-04 MED ORDER — PROPOFOL 10 MG/ML IV BOLUS
INTRAVENOUS | Status: AC
  Filled 2022-05-04: qty 20

## 2022-05-04 MED ORDER — LIDOCAINE HCL (CARDIAC) PF 100 MG/5ML IV SOSY
PREFILLED_SYRINGE | INTRAVENOUS | Status: DC | PRN
Start: 2022-05-04 — End: 2022-05-04
  Administered 2022-05-04: 80 mg via INTRAVENOUS

## 2022-05-04 MED ORDER — DEXAMETHASONE SODIUM PHOSPHATE 10 MG/ML IJ SOLN
INTRAMUSCULAR | Status: DC | PRN
  Administered 2022-05-04: 10 mg via INTRAVENOUS

## 2022-05-04 MED ORDER — OXYCODONE HCL 5 MG PO TABS
5.0000 mg | ORAL_TABLET | Freq: Once | ORAL | Status: AC | PRN
  Administered 2022-05-04: 5 mg via ORAL

## 2022-05-04 SURGICAL SUPPLY — 12 items
BAG URO CATCHER STRL LF (MISCELLANEOUS) ×2 IMPLANT
BASKET ZERO TIP NITINOL 2.4FR (BASKET) IMPLANT
CATH URETL OPEN END 6FR 70 (CATHETERS) ×1 IMPLANT
CLOTH BEACON ORANGE TIMEOUT ST (SAFETY) ×2 IMPLANT
GLOVE SURG LX 7.5 STRW (GLOVE) ×1
GLOVE SURG LX STRL 7.5 STRW (GLOVE) ×1 IMPLANT
GUIDEWIRE ANG ZIPWIRE 038X150 (WIRE) ×2 IMPLANT
GUIDEWIRE STR DUAL SENSOR (WIRE) IMPLANT
MANIFOLD NEPTUNE II (INSTRUMENTS) ×2 IMPLANT
PACK CYSTO (CUSTOM PROCEDURE TRAY) ×2 IMPLANT
TUBING CONNECTING 10 (TUBING) ×2 IMPLANT
TUBING UROLOGY SET (TUBING) IMPLANT

## 2022-05-04 NOTE — Anesthesia Preprocedure Evaluation (Addendum)
Anesthesia Evaluation  ?Patient identified by MRN, date of birth, ID band ?Patient awake ? ? ? ?Reviewed: ?Allergy & Precautions, NPO status , Patient's Chart, lab work & pertinent test results ? ?History of Anesthesia Complications ?(+) PONV and history of anesthetic complications ? ?Airway ?Mallampati: II ? ?TM Distance: >3 FB ?Neck ROM: Full ? ? ? Dental ?no notable dental hx. ? ?  ?Pulmonary ?neg pulmonary ROS, former smoker,  ?  ?Pulmonary exam normal ?breath sounds clear to auscultation ? ? ? ? ? ? Cardiovascular ?Exercise Tolerance: Good ?Normal cardiovascular exam+ dysrhythmias (h/o SVT s/p ablation) Supra Ventricular Tachycardia  ?Rhythm:Regular Rate:Normal ? ? ?  ?Neuro/Psych ? Headaches, PSYCHIATRIC DISORDERS Anxiety   ? GI/Hepatic ?negative GI ROS, Neg liver ROS, No current nausea/vomiting ?  ?Endo/Other  ?negative endocrine ROS ? Renal/GU ?Renal disease (frequent kidney stones)  ?negative genitourinary ?  ?Musculoskeletal ?negative musculoskeletal ROS ?(+)  ? Abdominal ?  ?Peds ?negative pediatric ROS ?(+)  Hematology ?negative hematology ROS ?(+)   ?Anesthesia Other Findings ? ? Reproductive/Obstetrics ?negative OB ROS ? ?  ? ? ? ? ? ? ? ? ? ? ? ? ? ?  ?  ? ? ? ? ? ? ? ?Anesthesia Physical ?Anesthesia Plan ? ?ASA: 2 and emergent ? ?Anesthesia Plan: General  ? ?Post-op Pain Management:   ? ?Induction: Intravenous ? ?PONV Risk Score and Plan: 3 and Treatment may vary due to age or medical condition, Midazolam, Ondansetron and Dexamethasone ? ?Airway Management Planned: LMA ? ?Additional Equipment: None ? ?Intra-op Plan:  ? ?Post-operative Plan: Extubation in OR ? ?Informed Consent: I have reviewed the patients History and Physical, chart, labs and discussed the procedure including the risks, benefits and alternatives for the proposed anesthesia with the patient or authorized representative who has indicated his/her understanding and acceptance.  ? ? ? ?Dental advisory  given ? ?Plan Discussed with: CRNA, Anesthesiologist and Surgeon ? ?Anesthesia Plan Comments:   ? ? ? ? ? ? ?Anesthesia Quick Evaluation ? ?

## 2022-05-04 NOTE — Transfer of Care (Signed)
Immediate Anesthesia Transfer of Care Note ? ?Patient: Thomas Howell ? ?Procedure(s) Performed: CYSTOSCOPY WITH RETROGRADE PYELOGRAM/URETERAL STENT PLACEMENT (Bilateral) ? ?Patient Location: PACU ? ?Anesthesia Type:General ? ?Level of Consciousness: drowsy and patient cooperative ? ?Airway & Oxygen Therapy: Patient Spontanous Breathing and Patient connected to face mask oxygen ? ?Post-op Assessment: Report given to RN and Post -op Vital signs reviewed and stable ? ?Post vital signs: Reviewed and stable ? ?Last Vitals:  ?Vitals Value Taken Time  ?BP 135/95 05/04/22 1753  ?Temp    ?Pulse 90 05/04/22 1754  ?Resp 12 05/04/22 1754  ?SpO2 100 % 05/04/22 1754  ?Vitals shown include unvalidated device data. ? ?Last Pain:  ?Vitals:  ? 05/04/22 1638  ?TempSrc: Oral  ?PainSc:   ?   ? ?Patients Stated Pain Goal: 4 (05/04/22 1629) ? ?Complications: No notable events documented. ?

## 2022-05-04 NOTE — Anesthesia Procedure Notes (Signed)
Procedure Name: LMA Insertion ?Date/Time: 05/04/2022 5:25 PM ?Performed by: Raenette Rover, CRNA ?Pre-anesthesia Checklist: Patient identified, Emergency Drugs available, Suction available and Patient being monitored ?Patient Re-evaluated:Patient Re-evaluated prior to induction ?Oxygen Delivery Method: Circle system utilized ?Preoxygenation: Pre-oxygenation with 100% oxygen ?Induction Type: IV induction ?LMA: LMA inserted ?LMA Size: 4.0 ?Number of attempts: 1 ?Placement Confirmation: positive ETCO2 and breath sounds checked- equal and bilateral ?Tube secured with: Tape ?Dental Injury: Teeth and Oropharynx as per pre-operative assessment  ? ? ? ? ?

## 2022-05-04 NOTE — Brief Op Note (Signed)
05/04/2022 ? ?5:44 PM ? ?PATIENT:  Thomas Howell  31 y.o. male ? ?PRE-OPERATIVE DIAGNOSIS:  bilateral renal and ureteral stones ? ?POST-OPERATIVE DIAGNOSIS:  bilateral renal and ureteral stones ? ?PROCEDURE:  Procedure(s): ?CYSTOSCOPY WITH RETROGRADE PYELOGRAM/URETERAL STENT PLACEMENT (Bilateral) ? ?SURGEON:  Surgeon(s) and Role: ?   Sebastian Ache, MD - Primary ? ?PHYSICIAN ASSISTANT:  ? ?ASSISTANTS: none  ? ?ANESTHESIA:   general ? ?EBL:  0 mL  ? ?BLOOD ADMINISTERED:none ? ?DRAINS: none  ? ?LOCAL MEDICATIONS USED:  NONE ? ?SPECIMEN:  No Specimen ? ?DISPOSITION OF SPECIMEN:  N/A ? ?COUNTS:  YES ? ?TOURNIQUET:  * No tourniquets in log * ? ?DICTATION: .Other Dictation: Dictation Number [id number] ? ?PLAN OF CARE: Discharge to home after PACU ? ?PATIENT DISPOSITION:  PACU - hemodynamically stable. ?  ?Delay start of Pharmacological VTE agent (>24hrs) due to surgical blood loss or risk of bleeding: yes ? ?

## 2022-05-04 NOTE — Discharge Instructions (Signed)
1 - You may have urinary urgency (bladder spasms) and bloody urine on / off with stent in place. This is normal. ° °2 - Call MD or go to ER for fever >102, severe pain / nausea / vomiting not relieved by medications, or acute change in medical status ° °

## 2022-05-04 NOTE — ED Triage Notes (Signed)
Pt states that he has a kidney stone and is complaining of vomiting and left flank pain since yesterday.   ?

## 2022-05-04 NOTE — H&P (Signed)
Thomas Howell is an 31 y.o. male.   ? ?Chief Complaint: Pre-Op Bilateral Ureteral Stent Placement ? ?HPI:  ? ?1 - Recurrent BILATERAL Renal / Ureteral Stones- s/p numerous stone procedures including URS, SWL, MET x many  ? ?Recnet Course:  ?04/2021 - bilateral ureteroscopy to stone free for bilateral ureteral stones  ?10/2021 - left ureteroscopy for ureteral stone  ? ?04/2022 - ER CT let ureteral 25m x 3 + bilateral scattered renal stones (about 1cm voluem each side)  ? 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 ?3 - Chronic Mild Left Hydro / Stricture - chronic left caliectasis and proximal mild ureteral narrowing (still accomodates ureteroscope / wires).  ? ?PMH sig for Migraine, SVT. [facility] [place] ? ? ?Today "Thomas Howell" is seen to proceed with BILATERAL ureteral stenting for left ureteral / bilateral renal stones and refracotry colic. No interval fevers.  ? ? ?Past Medical History:  ?Diagnosis Date  ? Complication of anesthesia   ? Dysrhythmia   ? hx of SVT ablation in 2019 - DR GCristopher Perudismissed by MD   ? Frequency-urgency syndrome   ? History of kidney stones   ? Hx of nausea and vomiting   ? d/t kidney stone  ? Hx of sepsis 08/11/2017  ? due to kidney stone/ hydronephrosis  ? Left ureteral stone   ? Migraine   ? hx of migraines   ? Pain due to ureteral stent ([facility]   ? PONV (postoperative nausea and vomiting)   ? none recently  ? Scoliosis of lumbar spine 1994  ? treated at [facility] age 31 ? Wears glasses   ? reading only  ? ? ?Past Surgical History:  ?Procedure Laterality Date  ? CYSTOSCOPY W/ RETROGRADES Right 07/06/2015  ? Procedure: CYSTOSCOPY WITH RETROGRADE PYELOGRAM;  Surgeon: MFestus Aloe MD;  Location: [facility];  Service: Urology;  Laterality: Right;  ? CYSTOSCOPY W/ URETERAL STENT PLACEMENT Left 08/08/2015  ? Procedure:  CYSTOSCOPY WITH STENT REPLACEMENT;  Surgeon: MFestus Aloe MD;  [facility]  Service: Urology;  Laterality: Left;  ? CYSTOSCOPY W/ URETERAL STENT PLACEMENT Left 02/21/2017  ? Procedure: CYSTOSCOPY WITH RETROGRADE PYELOGRAM/URETERAL LEFT STENT PLACEMENT WITH  LASER;  Surgeon: JIrine Seal MD;  Location: [facility];  Service: Urology;  Laterality: Left;  ? CYSTOSCOPY W/ URETERAL STENT PLACEMENT Left 08/10/2017  ? Procedure: CYSTOSCOPY WITH RETROGRADE PYELOGRAM/URETERAL STENT PLACEMENT;  Surgeon: BRaynelle Bring MD;  Location: [facility];  Service: Urology;  Laterality: Left;  ? CYSTOSCOPY W/ URETERAL STENT PLACEMENT Bilateral 04/19/2021  ? Procedure: CYSTOSCOPY WITH RETROGRADE PYELOGRAM/URETERAL STENT PLACEMENT;  Surgeon: MAlexis Frock MD;  Location: [facility];  Service: Urology;  Laterality: Bilateral;  ? CYSTOSCOPY W/ URETERAL STENT PLACEMENT Left 07/11/2021  ? Procedure: CYSTOSCOPY WITH RETROGRADE PYELOGRAM/URETEROSCOPY/URETERAL STENT PLACEMENT;  Surgeon: DFranchot Gallo MD;  Location: [facility];  Service: Urology;  Laterality: Left;  ? CYSTOSCOPY W/ URETERAL STENT PLACEMENT Left 11/04/2021  ? Procedure: CYSTOSCOPY WITH RETROGRADE PYELOGRAM//URETEROSCOPY/STONE EXTRACTION/URETERAL STENT PLACEMENT;  Surgeon: DFranchot Gallo MD;  Location: [facility];  Service: Urology;  Laterality: Left;  ? CYSTOSCOPY WITH RETROGRADE PYELOGRAM, URETEROSCOPY AND STENT PLACEMENT Left 06/24/2014  ? Procedure: CYSTOSCOPY WITH RETROGRADE PYELOGRAM,  AND STENT PLACEMENT;  Surgeon: DSharyn Creamer MD;  Location: [facility];  Service: Urology;  Laterality: Left;  ?  CYSTOSCOPY WITH RETROGRADE PYELOGRAM, URETEROSCOPY AND STENT PLACEMENT Left 07/05/2014  ? Procedure: CYSTO/LEFT URETEROSCOPY/LEFT RETROGRADE PYELOGRAM/LEFT STENT PLACEMENT;  Surgeon: Festus Aloe, MD;  Location: [facility];  Service: Urology;  Laterality: Left;  ? CYSTOSCOPY WITH RETROGRADE PYELOGRAM, URETEROSCOPY AND STENT PLACEMENT Left  07/06/2015  ? Procedure: CYSTOSCOPY WITH RETROGRADE PYELOGRAM, URETEROSCOPY , LASER, STENT PLACEMENT and BASKET EXTRACTION;  Surgeon: Festus Aloe, MD;  [facility];  Service: Urology;  Laterality: Left;  ? CYSTOSCOPY WITH RETROGRADE PYELOGRAM, URETEROSCOPY AND STENT PLACEMENT Left 08/19/2015  ? Procedure: CYSTOSCOPY WITH RETROGRADE PYELOGRAM, URETEROSCOPY AND STENT PLACEMENT;  Surgeon: Festus Aloe, MD;  [facility];  Service: Urology;  Laterality: Left;  ? CYSTOSCOPY WITH RETROGRADE PYELOGRAM, URETEROSCOPY AND STENT PLACEMENT Left 03/13/2018  ? Procedure: CYSTOSCOPY WITH RETROGRADE PYELOGRAM, URETEROSCOPY AND LEFT STENT PLACEMENT;  Surgeon: Irine Seal, MD;  [facility];  Service: Urology;  Laterality: Left;  ? CYSTOSCOPY WITH RETROGRADE PYELOGRAM, URETEROSCOPY AND STENT PLACEMENT Left 03/11/2021  ? Procedure: CYSTOSCOPY WITH RETROGRADE PYELOGRAM, URETEROSCOPY AND STENT PLACEMENT,STONE [facility];  Surgeon: Franchot Gallo, MD;  Location[facility];  Service: Urology;  Laterality: Left;  ? CYSTOSCOPY WITH RETROGRADE PYELOGRAM, URETEROSCOPY AND STENT PLACEMENT Bilateral 05/06/2021  ? Procedure: CYSTOSCOPY WITH RETROGRADE PYELOGRAM, URETEROSCOPY AND STENT REPLACEMENT;  Surgeon: Alexis Frock, MD;  [facility];  Service: Urology;  Laterality: Bilateral;  90 MINS  ? CYSTOSCOPY WITH URETEROSCOPY AND STENT PLACEMENT Left 01/16/2016  ? Procedure: CYSTOSCOPY WITH LEFT RETROGRADE PYELOGRAM  LEFT DIGITAL URETEROSCOPY AND PLACEMENT LEFT URETERAL STENT;  Surgeon: Festus Aloe, MD;  [facility];  Service: Urology;  Laterality: Left;  ? CYSTOSCOPY WITH URETEROSCOPY, STONE BASKETRY AND STENT PLACEMENT Left 02/13/2016  ? Procedure: CYSTOSCOPY WITH LEFT URETEROSCOPY, HOLMIUM LASER AND STENT PLACEMENT;  Surgeon: Festus Aloe, MD;  [facility];  Service: Urology;  Laterality: Left;  ? CYSTOSCOPY/RETROGRADE/URETEROSCOPY/STONE EXTRACTION WITH BASKET Left 08/08/2015  ?  Procedure: CYSTOSCOPY/RETROGRADE/URETEROSCOPY/STONE EXTRACTION WITH BASKET;  Surgeon: Festus Aloe, MD;  Location: [facility];  Service: Urology;  Laterality: Left;  ? CYSTOSCOPY/URETEROSCOPY/HOLMIUM LASER/STENT PLACEMENT Left 07/30/2016  ? Procedure: CYSTOSCOPY/URETEROSCOPY/HOLMIUM LASER/STENT PLACEMENT;  Surgeon: Festus Aloe, MD;  Location: [facility];  Service: Urology;  Laterality: Left;  ? CYSTOSCOPY/URETEROSCOPY/HOLMIUM LASER/STENT PLACEMENT Left 08/19/2017  ? Procedure: CYSTOSCOPY/URETEROSCOPY/HOLMIUM LASER/STENT PLACEMENT;  Surgeon: Festus Aloe, MD;  [facility];  Service: Urology;  Laterality: Left;  ? CYSTOSCOPY/URETEROSCOPY/HOLMIUM LASER/STENT PLACEMENT N/A 10/13/2018  ? Procedure: CYSTOSCOPY/RETROGRADE RIGHT URETEROSCOPY/ AND RIGHTSTENT PLACEMENT;  Surgeon: Irine Seal, MD;  [facility];  Service: Urology;  Laterality: N/A;  ? EXTRACORPOREAL SHOCK WAVE LITHOTRIPSY Left 07-07-2015  &  12-26-2014  ? FOOT CAPSULE RELEASE W/ PERCUTANEOUS HEEL CORD LENGTHENING, TIBIAL TENDON TRANSFER Left 1994  ? clubfoot  ? HOLMIUM LASER APPLICATION Left 38/93/7342  ? Procedure: LASER LITHO;  Surgeon: Festus Aloe, MD;  Location: [facility];  Service: Urology;  Laterality: Left;  ? HOLMIUM LASER APPLICATION Left 87/68/1157  ? Procedure: HOLMIUM LASER  WITH LITHOTRIPSY ;  Surgeon: Festus Aloe, MD;  Location: [facility];  Service: Urology;  Laterality: Left;  ? HOLMIUM LASER APPLICATION Left 26/20/3559  ? Procedure: HOLMIUM LASER APPLICATION;  Surgeon: Irine Seal, MD;  Location: [facility];  Service: Urology;  Laterality: Left;  ? HOLMIUM LASER APPLICATION Left 74/16/3845  ? Procedure: HOLMIUM LASER APPLICATION;  Surgeon: Irine Seal, MD;  Location[facility];  Service: Urology;  Laterality: Left;  ? HOLMIUM LASER APPLICATION Bilateral 36/46/8032  ? Procedure:  HOLMIUM LASER APPLICATION;  Surgeon: Alexis Frock, MD;   Location: [facility];  Service: Urology;  Laterality: Bilateral;  ? lumpectomy in neck for cat scratch fever     ? surgery for club foot at few days old  ? LYMPH GLAND EXCISION  2003  ? neck--  benign  ? SVT ABLATION N

## 2022-05-04 NOTE — ED Provider Notes (Signed)
?[facility]-EMERGENCY DEPT ?Provider Note ? ? ?CSN: [id number] ?Arrival date & time: 05/04/22  0049 ? ?  ? ?History ? ?Chief Complaint  ?Patient presents with  ? Nephrolithiasis  ? ? ?Thomas Howell is a 31 y.o. male with a past medical history significant for significant history of kidney stones requiring multiple surgeries, with stenosis, scarring of left-sided ureter who presents with concern for acute flank, back pain that began yesterday.  He reports that he was trying to control the pain with his at home oxycodone 10 mg 3 times daily, but pain became too unbearable, and he came in with acute nausea, vomiting and severe pain.  He reports that he was treated for a kidney stone and passed it 3 weeks ago, and is concerned about new kidney stone at this time.  He denies fever, chills.  He endorses a burning sensation with urination but denies hematuria.  He reports he has had some more difficulty with emptying bladder today but has not had acute retention.  He denies any diarrhea, constipation. ? ?HPI ? ?  ? ?Home Medications ?Prior to Admission medications   ?Medication Sig Start Date End Date Taking? Authorizing Provider  ?ketorolac (TORADOL) 10 MG tablet Take 10 mg by mouth every 8 (eight) hours as needed for pain. 01/15/22  Yes [provider]  ?Oxycodone HCl 10 MG TABS Take 10 mg by mouth 3 (three) times daily. 01/05/22  Yes [provider]  ?promethazine (PHENERGAN) 25 MG tablet Take 1 tablet (25 mg total) by mouth every 8 (eight) hours as needed for up to 20 doses for nausea or vomiting. 01/27/22  Yes Gloris Manchesterixon, Ryan, MD  ?rizatriptan (MAXALT-MLT) 10 MG disintegrating tablet DISSOLVE 1 TABLET(10 MG) ON THE TONGUE DAILY AS NEEDED FOR MIGRAINE. MAY REPEAT IN 2 HOURS AS NEEDED. MAX 2 PER 24 HOURS ?Patient taking differently: Take 10 mg by mouth 2 (two) times daily as needed for migraine. 12/18/21  Yes Campbell RichesHoskins, Carolyn C, NP  ?tamsulosin (FLOMAX) 0.4 MG CAPS capsule Take 1  capsule (0.4 mg total) by mouth daily. 01/23/22  Yes Wynetta FinesMessick, Peter C, MD  ?naloxone [facility](NARCAN) nasal spray 4 mg/0.1 mL Place 1 spray into the nose as directed. For overdose ?Patient not taking: Reported on 01/27/2022 12/08/21   [provider]  ?   ? ?Allergies    ?Bactrim [sulfamethoxazole-trimethoprim] and Ketamine   ? ?Review of Systems   ?Review of Systems  ?Gastrointestinal:  Positive for nausea and vomiting.  ?Genitourinary:  Positive for difficulty urinating and flank pain.  ?All other systems reviewed and are negative. ? ?Physical Exam ?Updated Vital Signs ?BP 131/82 (BP Location: Left Arm)   Pulse 94   Temp 97.9 ?F (36.6 ?C) (Oral)   Resp 18   Ht 5\' 8"  (1.727 m)   Wt 104.3 kg   SpO2 94%   BMI 34.97 kg/m?  ?Physical Exam ?Vitals and nursing note reviewed.  ?Constitutional:   ?   General: He is not in acute distress. ?   Appearance: Normal appearance. He is ill-appearing.  ?HENT:  ?   Head: Normocephalic and atraumatic.  ?Eyes:  ?   General:     ?   Right eye: No discharge.     ?   Left eye: No discharge.  ?Cardiovascular:  ?   Rate and Rhythm: Normal rate and regular rhythm.  ?   Heart sounds: No murmur heard. ?  No friction rub. No gallop.  ?Pulmonary:  ?  Effort: Pulmonary effort is normal.  ?   Breath sounds: Normal breath sounds.  ?Abdominal:  ?   General: Bowel sounds are normal.  ?   Palpations: Abdomen is soft.  ?   Comments: Patient with significant tenderness to palpation left flank, with positive CVA tenderness on the left.  No significant tenderness throughout the rest of the abdomen or on the right.  Normal bowel sounds throughout.  No evidence of distention of the lower abdomen, suprapubically.  ?Skin: ?   General: Skin is warm and dry.  ?   Capillary Refill: Capillary refill takes less than 2 seconds.  ?Neurological:  ?   Mental Status: He is alert and oriented to person, place, and time.  ?Psychiatric:     ?   Mood and Affect: Mood normal.     ?   Behavior: Behavior normal.   ? ? ?ED Results / Procedures / Treatments   ?Labs ?(all labs ordered are listed, but only abnormal results are displayed) ?Labs Reviewed  ?COMPREHENSIVE METABOLIC PANEL - Abnormal; Notable for the following components:  ?    Result Value  ? CO2 20 (*)   ? Glucose, Bld 123 (*)   ? BUN 27 (*)   ? Creatinine, Ser 1.37 (*)   ? Total Protein 8.5 (*)   ? All other components within normal limits  ?CBC WITH DIFFERENTIAL/PLATELET - Abnormal; Notable for the following components:  ? WBC 12.2 (*)   ? Neutro Abs 10.6 (*)   ? All other components within normal limits  ?URINALYSIS, ROUTINE W REFLEX MICROSCOPIC - Abnormal; Notable for the following components:  ? Hgb urine dipstick SMALL (*)   ? Ketones, ur 80 (*)   ? Protein, ur 30 (*)   ? All other components within normal limits  ?LIPASE, BLOOD  ? ? ?EKG ?None ? ?Radiology ?CT Renal Stone Study ? ?Result Date: 05/04/2022 ?CLINICAL DATA:  Left-sided flank pain for 2 days EXAM: CT ABDOMEN AND PELVIS WITHOUT CONTRAST TECHNIQUE: Multidetector CT imaging of the abdomen and pelvis was performed following the standard protocol without IV contrast. RADIATION DOSE REDUCTION: This exam was performed according to the departmental dose-optimization program which includes automated exposure control, adjustment of the mA and/or kV according to patient size and/or use of iterative reconstruction technique. COMPARISON:  01/27/2022 FINDINGS: Lower chest: No acute abnormality. Hepatobiliary: Fatty infiltration of the liver is noted. The gallbladder is within normal limits. Pancreas: Unremarkable. No pancreatic ductal dilatation or surrounding inflammatory changes. Spleen: Normal in size without focal abnormality. Adrenals/Urinary Tract: Adrenal glands are within normal limits. Right kidney demonstrates multiple nonobstructing renal calculi. Ureter on the right is within normal limits. Left kidney is somewhat enlarged with multiple nonobstructing renal calculi similar to that seen on prior CT  examination. Significant hydronephrosis and hydroureter is noted secondary to multiple stones within the left ureter. The largest of these measures approximately 7 mm. The more distal left ureter is within normal limits. The bladder is decompressed. Stomach/Bowel: No obstructive or inflammatory changes are noted in the colon. The appendix is within normal limits. No obstructive or inflammatory changes of the colon are seen. Vascular/Lymphatic: No significant vascular findings are present. No enlarged abdominal or pelvic lymph nodes. Reproductive: Prostate is unremarkable. Other: No abdominal wall hernia or abnormality. No abdominopelvic ascites. Musculoskeletal: No acute or significant osseous findings. IMPRESSION: Multiple stones within the mid left ureter causing obstructive significant hydronephrosis and hydroureter. Bilateral nonobstructing renal calculi. The largest of these measures 6 mm in the  midportion of the left kidney. Fatty liver. Electronically Signed   By: Alcide Clever M.D.   On: 05/04/2022 03:28   ? ?Procedures ?Procedures  ? ? ?Medications Ordered in ED ?Medications  ?HYDROmorphone (DILAUDID) injection 1 mg (1 mg Intravenous Given 05/04/22 0120)  ?ondansetron [facility]) injection 4 mg (4 mg Intravenous Given 05/04/22 0118)  ?ketorolac (TORADOL) 15 MG/ML injection 15 mg (15 mg Intravenous Given 05/04/22 0219)  ?prochlorperazine (COMPAZINE) injection 10 mg (10 mg Intravenous Given 05/04/22 0221)  ?HYDROmorphone (DILAUDID) injection 1 mg (1 mg Intravenous Given 05/04/22 0329)  ?HYDROmorphone (DILAUDID) injection 1 mg (1 mg Intravenous Given 05/04/22 0436)  ?HYDROmorphone (DILAUDID) injection 1 mg (1 mg Intravenous Given 05/04/22 0602)  ?ketorolac (TORADOL) 15 MG/ML injection 15 mg (15 mg Intravenous Given 05/04/22 0600)  ? ? ?ED Course/ Medical Decision Making/ A&P ?  ?                        ?Medical Decision Making ?Amount and/or Complexity of Data Reviewed ?Labs: ordered. ?Radiology:  ordered. ? ?Risk ?Prescription drug management. ? ? ?This patient is a 31 y.o. male who presents to the ED for concern of left-sided flank pain, nausea, vomiting, this involves an extensive number of treatment options, and is a complaint tha

## 2022-05-04 NOTE — Discharge Instructions (Signed)
I spoke with the on-call provider at [facility] urology they think that you will need a stent, and requested that you follow-up in their clinic.  Please go immediately to [facility] when they open this morning.  You can let them know that Luther Hearing, PA-C spoke with the on-call provider -- I believe it was Dr. Lafonda Mosses, and I was told to present to clinic for further evaluation and likely stenting. ?

## 2022-05-04 NOTE — ED Notes (Signed)
Rounded on pt. Pt currently denies any complaints and did not need any further assistance at this time.  ?

## 2022-05-04 NOTE — ED Notes (Signed)
Pt states that he is unable to give a urine sample at this time, but says he will let me know when he can provide one.  ?

## 2022-05-04 NOTE — Anesthesia Postprocedure Evaluation (Signed)
Anesthesia Post Note ? ?Patient: Jayin Derousse Nygaard II ? ?Procedure(s) Performed: CYSTOSCOPY WITH RETROGRADE PYELOGRAM/URETERAL STENT PLACEMENT (Bilateral) ? ?  ? ?Patient location during evaluation: PACU ?Anesthesia Type: General ?Level of consciousness: awake ?Pain management: pain level controlled ?Vital Signs Assessment: post-procedure vital signs reviewed and stable ?Respiratory status: spontaneous breathing and respiratory function stable ?Cardiovascular status: stable ?Postop Assessment: no apparent nausea or vomiting ?Anesthetic complications: no ? ? ?No notable events documented. ? ?Last Vitals:  ?Vitals:  ? 05/04/22 1754 05/04/22 1800  ?BP: (!) 135/95 (!) 133/101  ?Pulse: 77 91  ?Resp: 10 19  ?Temp:    ?SpO2: 100% 98%  ?  ?Last Pain:  ?Vitals:  ? 05/04/22 1638  ?TempSrc: Oral  ?PainSc:   ? ? ?  ?  ?  ?  ?  ?  ? ?Mellody Dance ? ? ? ? ?

## 2022-05-04 NOTE — ED Notes (Signed)
I provided reinforced discharge education based off of discharge instructions. Pt acknowledged and understood my education. Pt had no further questions/concerns for provider/myself.  °

## 2022-05-05 ENCOUNTER — Encounter (HOSPITAL_COMMUNITY): Payer: Self-pay | Admitting: Urology

## 2022-05-05 NOTE — Op Note (Signed)
NAME: Thomas Howell, Thomas Howell MEDICAL RECORD NO: [id number] ACCOUNT NO: [id number] DATE OF BIRTH: Jan 18, 1991 FACILITY: [facility] LOCATION: [facility] PHYSICIAN: Sebastian Ache, MD  Operative Report   DATE OF PROCEDURE: 05/04/2022  PREOPERATIVE DIAGNOSIS:  Left ureteral, bilateral renal stones, recurrent with refractory colic.  PROCEDURE PERFORMED:  1.  Cystoscopy with bilateral retrograde pyelograms interpretation. 2.  Insertion of bilateral ureteral stents.  ESTIMATED BLOOD LOSS:  Nil.  COMPLICATIONS:  None.  SPECIMEN:  None.  FINDINGS:  1.  Multifocal left mid ureteral stone with proximal significant hydronephrosis. 2.  Successful placement of bilateral ureteral stents.  INDICATIONS:  The patient is a very pleasant 31 year old man with an unfortunate history of recurrent large volume urolithiasis, status post many previous procedures.  He was found on workup of colicky flank pain to have multifocal, fairly large volume  left mid ureteral stones with acute worsening of his chronic left hydronephrosis as well as significant volume bilateral renal stones.  Options were discussed for management including medical therapy versus shockwave lithotripsy versus ureteroscopy and  he wished to proceed with a path towards bilateral ureteroscopy with goal of stone free.  Given his substantial colic, which was urgent, the temporizing measure with bilateral stents would be most prudent.  He presents for this today.  Informed consent  was obtained and placed in medical record.  PROCEDURE IN DETAIL:  The patient is being verified, procedure being bilateral ureteral stent placement was confirmed.  Procedure timeout was performed.  Intravenous antibiotics were administered.  General anesthesia was induced.  The patient was placed  into a low lithotomy position.  Sterile field was created, prepped and draped the patient's penis, perineum, and proximal thighs using iodine.  Cystourethroscopy was performed using  21-French rigid cystoscope with offset lens.  Inspection of anterior and  posterior urethra were unremarkable.  Inspection of urinary bladder revealed no diverticula, calcifications, papillary lesions.  Ureteral orifices were single bilaterally.  The left ureteral orifice was cannulated with a 6-French end-hole catheter, and  a left retrograde pyelogram was obtained.  Left retrograde pyelogram demonstrated a single left ureter, single system left kidney.  There was filling defect in the mid ureter consistent with known stone.  There was significant hydronephrosis above this, moderate to severe.  A 0.038 ZIPwire was  advanced to level of upper pole over which a new 5 x 26 Polaris type stent was placed using cystoscopic and fluoroscopic guidance.  Good proximal and distal planes were noted.  Next, the right ureteral orifice was cannulated with a 6-French end-hole  catheter and a right retrograde pyelogram was obtained.  Right retrograde pyelogram demonstrated single right ureter, single system right kidney.  No filling defects or narrowing noted.  A 0.038 ZIPwire was advanced to the level of the upper pole, over which a separate 5 x 26 Polaris type stent was placed  using cystoscopic and fluoroscopic guidance.  Good proximal and distal planes were noted.  Bladder was emptied per cystoscope.  Procedure was then terminated.  The patient tolerated procedure well, no immediate periprocedural complications.  The patient  was taken to postanesthesia care in stable condition.  Plan for discharge home.  He will present for bilateral ureteroscopy with goal of stone free in the elective setting in several weeks after passive dilation.   SHW D: 05/04/2022 5:47:54 pm T: 05/05/2022 12:35:00 am  JOB: [id number]/ [id number]

## 2022-05-07 ENCOUNTER — Other Ambulatory Visit: Payer: Self-pay | Admitting: Urology

## 2022-05-11 DIAGNOSIS — Z79891 Long term (current) use of opiate analgesic: Secondary | ICD-10-CM | POA: Diagnosis not present

## 2022-05-11 DIAGNOSIS — F4542 Pain disorder with related psychological factors: Secondary | ICD-10-CM | POA: Diagnosis not present

## 2022-05-11 DIAGNOSIS — G8929 Other chronic pain: Secondary | ICD-10-CM | POA: Diagnosis not present

## 2022-05-11 DIAGNOSIS — M546 Pain in thoracic spine: Secondary | ICD-10-CM | POA: Diagnosis not present

## 2022-05-14 ENCOUNTER — Other Ambulatory Visit: Payer: Self-pay

## 2022-05-14 ENCOUNTER — Encounter (HOSPITAL_BASED_OUTPATIENT_CLINIC_OR_DEPARTMENT_OTHER): Payer: Self-pay | Admitting: Urology

## 2022-05-19 ENCOUNTER — Ambulatory Visit (HOSPITAL_BASED_OUTPATIENT_CLINIC_OR_DEPARTMENT_OTHER)
Admission: RE | Admit: 2022-05-19 | Discharge: 2022-05-19 | Disposition: A | Discharge status: Home / Self Care | Payer: BC Managed Care – PPO | Attending: Urology | Admitting: Urology

## 2022-05-19 ENCOUNTER — Encounter (HOSPITAL_BASED_OUTPATIENT_CLINIC_OR_DEPARTMENT_OTHER): Payer: Self-pay | Admitting: Urology

## 2022-05-19 ENCOUNTER — Ambulatory Visit (HOSPITAL_BASED_OUTPATIENT_CLINIC_OR_DEPARTMENT_OTHER): Payer: BC Managed Care – PPO | Admitting: Anesthesiology

## 2022-05-19 ENCOUNTER — Encounter (HOSPITAL_BASED_OUTPATIENT_CLINIC_OR_DEPARTMENT_OTHER)
Admission: RE | Disposition: A | Discharge status: Home / Self Care | Payer: Self-pay | Source: Home / Self Care | Attending: Urology

## 2022-05-19 DIAGNOSIS — F419 Anxiety disorder, unspecified: Secondary | ICD-10-CM | POA: Diagnosis not present

## 2022-05-19 DIAGNOSIS — N200 Calculus of kidney: Secondary | ICD-10-CM

## 2022-05-19 DIAGNOSIS — Z87891 Personal history of nicotine dependence: Secondary | ICD-10-CM | POA: Diagnosis not present

## 2022-05-19 DIAGNOSIS — N202 Calculus of kidney with calculus of ureter: Secondary | ICD-10-CM | POA: Diagnosis not present

## 2022-05-19 DIAGNOSIS — Z87442 Personal history of urinary calculi: Secondary | ICD-10-CM | POA: Insufficient documentation

## 2022-05-19 DIAGNOSIS — I471 Supraventricular tachycardia: Secondary | ICD-10-CM | POA: Diagnosis not present

## 2022-05-19 DIAGNOSIS — N132 Hydronephrosis with renal and ureteral calculous obstruction: Secondary | ICD-10-CM | POA: Insufficient documentation

## 2022-05-19 HISTORY — PX: CYSTOSCOPY WITH RETROGRADE PYELOGRAM, URETEROSCOPY AND STENT PLACEMENT: SHX5789

## 2022-05-19 HISTORY — PX: HOLMIUM LASER APPLICATION: SHX5852

## 2022-05-19 SURGERY — CYSTOURETEROSCOPY, WITH RETROGRADE PYELOGRAM AND STENT INSERTION
Anesthesia: General | Site: Ureter | Laterality: Bilateral

## 2022-05-19 MED ORDER — MIDAZOLAM HCL 2 MG/2ML IJ SOLN
INTRAMUSCULAR | Status: AC
  Filled 2022-05-19: qty 2

## 2022-05-19 MED ORDER — PROMETHAZINE HCL 25 MG/ML IJ SOLN
12.5000 mg | Freq: Once | INTRAMUSCULAR | Status: AC
  Administered 2022-05-19: 12.5 mg via INTRAVENOUS

## 2022-05-19 MED ORDER — DEXAMETHASONE SODIUM PHOSPHATE 10 MG/ML IJ SOLN
INTRAMUSCULAR | Status: DC | PRN
  Administered 2022-05-19: 10 mg via INTRAVENOUS

## 2022-05-19 MED ORDER — HYDROMORPHONE HCL 1 MG/ML IJ SOLN
0.2500 mg | INTRAMUSCULAR | Status: DC | PRN
  Administered 2022-05-19 (×4): 0.5 mg via INTRAVENOUS

## 2022-05-19 MED ORDER — ONDANSETRON HCL 4 MG/2ML IJ SOLN
INTRAMUSCULAR | Status: AC
  Filled 2022-05-19: qty 14

## 2022-05-19 MED ORDER — FENTANYL CITRATE (PF) 250 MCG/5ML IJ SOLN
INTRAMUSCULAR | Status: DC | PRN
  Administered 2022-05-19 (×4): 25 ug via INTRAVENOUS
  Administered 2022-05-19: 50 ug via INTRAVENOUS
  Administered 2022-05-19 (×2): 25 ug via INTRAVENOUS
  Administered 2022-05-19: 50 ug via INTRAVENOUS

## 2022-05-19 MED ORDER — HYDROMORPHONE HCL 1 MG/ML IJ SOLN
INTRAMUSCULAR | Status: AC
  Filled 2022-05-19: qty 1

## 2022-05-19 MED ORDER — EPHEDRINE 5 MG/ML INJ
INTRAVENOUS | Status: AC
  Filled 2022-05-19: qty 15

## 2022-05-19 MED ORDER — DEXAMETHASONE SODIUM PHOSPHATE 10 MG/ML IJ SOLN
INTRAMUSCULAR | Status: AC
  Filled 2022-05-19: qty 4

## 2022-05-19 MED ORDER — GENTAMICIN SULFATE 40 MG/ML IJ SOLN
420.0000 mg | INTRAVENOUS | Status: AC
  Administered 2022-05-19: 420 mg via INTRAVENOUS
  Filled 2022-05-19: qty 10.5

## 2022-05-19 MED ORDER — FENTANYL CITRATE (PF) 100 MCG/2ML IJ SOLN
INTRAMUSCULAR | Status: AC
  Filled 2022-05-19: qty 2

## 2022-05-19 MED ORDER — HYDROMORPHONE HCL 1 MG/ML IJ SOLN: 1.0000 mg | Freq: Once | INTRAMUSCULAR | Status: DC

## 2022-05-19 MED ORDER — KETOROLAC TROMETHAMINE 30 MG/ML IJ SOLN
INTRAMUSCULAR | Status: AC
  Filled 2022-05-19: qty 1

## 2022-05-19 MED ORDER — MIDAZOLAM HCL 2 MG/2ML IJ SOLN
INTRAMUSCULAR | Status: DC | PRN
  Administered 2022-05-19: 2 mg via INTRAVENOUS

## 2022-05-19 MED ORDER — PROPOFOL 10 MG/ML IV BOLUS
INTRAVENOUS | Status: AC
  Filled 2022-05-19: qty 20

## 2022-05-19 MED ORDER — LIDOCAINE 2% (20 MG/ML) 5 ML SYRINGE
INTRAMUSCULAR | Status: DC | PRN
  Administered 2022-05-19: 80 mg via INTRAVENOUS

## 2022-05-19 MED ORDER — DEXMEDETOMIDINE (PRECEDEX) IN NS 20 MCG/5ML (4 MCG/ML) IV SYRINGE
PREFILLED_SYRINGE | INTRAVENOUS | Status: DC | PRN
  Administered 2022-05-19 (×3): 4 ug via INTRAVENOUS
  Administered 2022-05-19: 8 ug via INTRAVENOUS

## 2022-05-19 MED ORDER — ONDANSETRON HCL 4 MG/2ML IJ SOLN
INTRAMUSCULAR | Status: DC | PRN
  Administered 2022-05-19: 4 mg via INTRAVENOUS

## 2022-05-19 MED ORDER — KETOROLAC TROMETHAMINE 10 MG PO TABS: 10.0000 mg | ORAL_TABLET | Freq: Three times a day (TID) | ORAL | 0 refills | Status: DC | PRN

## 2022-05-19 MED ORDER — AMISULPRIDE (ANTIEMETIC) 5 MG/2ML IV SOLN
INTRAVENOUS | Status: AC
  Filled 2022-05-19: qty 4

## 2022-05-19 MED ORDER — ACETAMINOPHEN 500 MG PO TABS
1000.0000 mg | ORAL_TABLET | Freq: Once | ORAL | Status: AC
  Administered 2022-05-19: 1000 mg via ORAL

## 2022-05-19 MED ORDER — HYDROMORPHONE HCL 1 MG/ML IJ SOLN
1.0000 mg | INTRAMUSCULAR | Status: DC | PRN
  Administered 2022-05-19: 1 mg via INTRAVENOUS

## 2022-05-19 MED ORDER — PHENYLEPHRINE 80 MCG/ML (10ML) SYRINGE FOR IV PUSH (FOR BLOOD PRESSURE SUPPORT)
PREFILLED_SYRINGE | INTRAVENOUS | Status: AC
  Filled 2022-05-19: qty 40

## 2022-05-19 MED ORDER — OXYCODONE-ACETAMINOPHEN 5-325 MG PO TABS: 1.0000 | ORAL_TABLET | Freq: Four times a day (QID) | ORAL | 0 refills | Status: DC | PRN

## 2022-05-19 MED ORDER — ACETAMINOPHEN 500 MG PO TABS
ORAL_TABLET | ORAL | Status: AC
  Filled 2022-05-19: qty 2

## 2022-05-19 MED ORDER — FENTANYL CITRATE (PF) 100 MCG/2ML IJ SOLN
25.0000 ug | INTRAMUSCULAR | Status: DC | PRN
  Administered 2022-05-19: 25 ug via INTRAVENOUS
  Administered 2022-05-19 (×2): 50 ug via INTRAVENOUS
  Administered 2022-05-19: 25 ug via INTRAVENOUS

## 2022-05-19 MED ORDER — SODIUM CHLORIDE 0.9 % IR SOLN
Status: DC | PRN
  Administered 2022-05-19: 6000 mL

## 2022-05-19 MED ORDER — PROPOFOL 10 MG/ML IV BOLUS
INTRAVENOUS | Status: DC | PRN
  Administered 2022-05-19: 20 mg via INTRAVENOUS
  Administered 2022-05-19: 200 mg via INTRAVENOUS

## 2022-05-19 MED ORDER — IOHEXOL 300 MG/ML  SOLN
INTRAMUSCULAR | Status: DC | PRN
  Administered 2022-05-19: 30 mL
  Administered 2022-05-19: 8 mL

## 2022-05-19 MED ORDER — KETOROLAC TROMETHAMINE 30 MG/ML IJ SOLN
30.0000 mg | Freq: Once | INTRAMUSCULAR | Status: AC
Start: 2022-05-19 — End: 2022-05-19
  Administered 2022-05-19: 30 mg via INTRAVENOUS

## 2022-05-19 MED ORDER — AMISULPRIDE (ANTIEMETIC) 5 MG/2ML IV SOLN
10.0000 mg | Freq: Once | INTRAVENOUS | Status: AC
Start: 2022-05-19 — End: 2022-05-19
  Administered 2022-05-19: 10 mg via INTRAVENOUS

## 2022-05-19 MED ORDER — LIDOCAINE HCL (PF) 2 % IJ SOLN
INTRAMUSCULAR | Status: AC
  Filled 2022-05-19: qty 25

## 2022-05-19 MED ORDER — PROMETHAZINE HCL 25 MG/ML IJ SOLN
INTRAMUSCULAR | Status: AC
  Filled 2022-05-19: qty 1

## 2022-05-19 MED ORDER — LACTATED RINGERS IV SOLN: INTRAVENOUS | Status: DC

## 2022-05-19 SURGICAL SUPPLY — 26 items
BAG DRAIN URO-CYSTO SKYTR STRL (DRAIN) ×2 IMPLANT
BAG DRN UROCATH (DRAIN) ×1
BASKET LASER NITINOL 1.9FR (BASKET) ×1 IMPLANT
BSKT STON RTRVL 120 1.9FR (BASKET) ×1
CATH INTERMIT  6FR 70CM (CATHETERS) ×1 IMPLANT
CLOTH BEACON ORANGE TIMEOUT ST (SAFETY) ×2 IMPLANT
FIBER LASER FLEXIVA 365 (UROLOGICAL SUPPLIES) IMPLANT
GLOVE BIO SURGEON STRL SZ7.5 (GLOVE) ×2 IMPLANT
GOWN STRL REUS W/TWL LRG LVL3 (GOWN DISPOSABLE) ×2 IMPLANT
GUIDEWIRE ANG ZIPWIRE 038X150 (WIRE) ×3 IMPLANT
GUIDEWIRE STR DUAL SENSOR (WIRE) ×3 IMPLANT
IV NS 1000ML (IV SOLUTION) ×2
IV NS 1000ML BAXH (IV SOLUTION) ×1 IMPLANT
IV NS IRRIG 3000ML ARTHROMATIC (IV SOLUTION) ×3 IMPLANT
KIT TURNOVER CYSTO (KITS) ×2 IMPLANT
MANIFOLD NEPTUNE II (INSTRUMENTS) ×2 IMPLANT
NS IRRIG 500ML POUR BTL (IV SOLUTION) ×2 IMPLANT
PACK CYSTO (CUSTOM PROCEDURE TRAY) ×2 IMPLANT
STENT POLARIS 5FRX26 (STENTS) ×2 IMPLANT
SYR 10ML LL (SYRINGE) ×2 IMPLANT
SYR 30ML LL (SYRINGE) ×1 IMPLANT
TRACTIP FLEXIVA PULS ID 200XHI (Laser) IMPLANT
TRACTIP FLEXIVA PULSE ID 200 (Laser) ×2
TUBE CONNECTING 12X1/4 (SUCTIONS) ×2 IMPLANT
TUBE FEEDING 8FR 16IN STR KANG (MISCELLANEOUS) ×1 IMPLANT
TUBING UROLOGY SET (TUBING) ×2 IMPLANT

## 2022-05-19 NOTE — Transfer of Care (Signed)
Immediate Anesthesia Transfer of Care Note  Patient: Thomas Howell  Procedure(s) Performed: CYSTOSCOPY WITH RETROGRADE PYELOGRAM, URETEROSCOPY AND STENT  EXCHANGE (Bilateral: Ureter) HOLMIUM LASER APPLICATION (Bilateral: Ureter)  Patient Location: PACU  Anesthesia Type:General  Level of Consciousness: sedated  Airway & Oxygen Therapy: Patient Spontanous Breathing  Post-op Assessment: Report given to RN and Post -op Vital signs reviewed and stable  Post vital signs: Reviewed and stable  Last Vitals:  Vitals Value Taken Time  BP    Temp    Pulse 79 05/19/22 1508  Resp 15 05/19/22 1508  SpO2 96 % 05/19/22 1508  Vitals shown include unvalidated device data.  Last Pain:  Vitals:   05/19/22 1106  TempSrc: Oral  PainSc: 7       Patients Stated Pain Goal: 4 ([id number])  Complications: No notable events documented.

## 2022-05-19 NOTE — Brief Op Note (Signed)
05/19/2022  2:57 PM  PATIENT:  Thomas Howell  31 y.o. male  PRE-OPERATIVE DIAGNOSIS:  BILATERAL RENAL / URETERAL STONES  POST-OPERATIVE DIAGNOSIS:  BILATERAL RENAL / URETERAL STONES  PROCEDURE:  Procedure(s) with comments: CYSTOSCOPY WITH RETROGRADE PYELOGRAM, URETEROSCOPY AND STENT  EXCHANGE (Bilateral) - 90 MINS HOLMIUM LASER APPLICATION (Bilateral)  SURGEON:  Surgeon(s) and Role:    Sebastian Ache, MD - Primary  PHYSICIAN ASSISTANT:   ASSISTANTS: none   ANESTHESIA:   general  EBL:  minimal   BLOOD ADMINISTERED:none  DRAINS: none   LOCAL MEDICATIONS USED:  NONE  SPECIMEN:  Source of Specimen:  bilateral renal / ureteral stone fragments  DISPOSITION OF SPECIMEN:   [facility] for compositional analysis  COUNTS:  YES  TOURNIQUET:  * No tourniquets in log *  DICTATION: .Other Dictation: Dictation Number [id number]  PLAN OF CARE: Discharge to home after PACU  PATIENT DISPOSITION:  PACU - hemodynamically stable.   Delay start of Pharmacological VTE agent (>24hrs) due to surgical blood loss or risk of bleeding: yes

## 2022-05-19 NOTE — H&P (Signed)
Thomas Howell is an 31 y.o. male.    Chief Complaint: Pre-OP Bilateral Ureteroscopic Stone Manipulation  HPI:   1 - Recurrent BILATERAL Renal / Ureteral Stones- s/p numerous stone procedures including URS, SWL, MET x many   Recnet Course:  04/2021 - bilateral ureteroscopy to stone free for bilateral ureteral stones  10/2021 - left ureteroscopy for ureteral stone   04/2022 - ER CT let ureteral 47m x 3 + bilateral scattered renal stones (about 1cm voluem each side) ==> Bilateral ureteral stents on 05/04/22  2 - Medical Stone Disease / Mild Renal Leak Hyperrcalciurria:  Eval 2017: BMP, PTH, Urate- normal; Composition 80% CaPO4, 20% CaOx; 24 Hr Urines - mild high Ca 213 ==> HCTZ   3 - Chronic Mild Left Hydro / Stricture - chronic left caliectasis and proximal mild ureteral narrowing (still accomodates ureteroscope / wires).   PMH sig for Migraine, SVT. His PCP is Thomas Howell   Today "RStormy Howell is seen to proceed with BILATERAL ureteroscopic stone manipulation. No interval fevers.   Past Medical History:  Diagnosis Date   Complication of anesthesia    Dysrhythmia    hx of SVT ablation in 2019 - DR GCristopher Perudismissed by MD    Frequency-urgency syndrome    History of kidney stones    Hx of nausea and vomiting    d/t kidney stone   Hx of sepsis 08/11/2017   due to kidney stone/ hydronephrosis   Left ureteral stone    Migraine    hx of migraines    Pain due to ureteral stent (HCC)    PONV (postoperative nausea and vomiting)    none recently   Scoliosis of lumbar spine 1994   [facility] 31  W[facility]    reading only    Past Surgical History:  Procedure Laterality Date   CYSTOSCOPY W/ RETROGRADES Right 07/06/2015   Procedure: CYSTOSCOPY WITH RETROGRADE PYELOGRAM;  Surgeon: MFestus Aloe MD;  Location: [facility];  Service: Urology;  Laterality: Right;   CYSTOSCOPY W/ URETERAL STENT PLACEMENT Left 08/08/2015   Procedure: CYSTOSCOPY  WITH STENT REPLACEMENT;  Surgeon: MFestus Aloe MD;  [facility]  Service: Urology;  Laterality: Left;   CYSTOSCOPY W/ URETERAL STENT PLACEMENT Left 02/21/2017   Procedure: CYSTOSCOPY WITH RETROGRADE PYELOGRAM/URETERAL LEFT STENT PLACEMENT WITH  LASER;  Surgeon: JIrine Seal MD;  Location: [facility];  Service: Urology;  Laterality: Left;   CYSTOSCOPY W/ URETERAL STENT PLACEMENT Left 08/10/2017   Procedure: CYSTOSCOPY WITH RETROGRADE PYELOGRAM/URETERAL STENT PLACEMENT;  Surgeon: BRaynelle Bring MD;  Location: [facility];  Service: Urology;  Laterality: Left;   CYSTOSCOPY W/ URETERAL STENT PLACEMENT Bilateral 04/19/2021   Procedure: CYSTOSCOPY WITH RETROGRADE PYELOGRAM/URETERAL STENT PLACEMENT;  Surgeon: MAlexis Frock MD;  Location: [facility];  Service: Urology;  Laterality: Bilateral;   CYSTOSCOPY W/ URETERAL STENT PLACEMENT Left 07/11/2021   Procedure: CYSTOSCOPY WITH RETROGRADE PYELOGRAM/URETEROSCOPY/URETERAL STENT PLACEMENT;  Surgeon: DFranchot Gallo MD;  Location: [facility];  Service: Urology;  Laterality: Left;   CYSTOSCOPY W/ URETERAL STENT PLACEMENT Left 11/04/2021   Procedure: CYSTOSCOPY WITH RETROGRADE PYELOGRAM//URETEROSCOPY/STONE EXTRACTION/URETERAL STENT PLACEMENT;  Surgeon: DFranchot Gallo MD;  Location: [facility];  Service: Urology;  Laterality: Left;   CYSTOSCOPY W/ URETERAL STENT PLACEMENT Bilateral 05/04/2022   Procedure: CYSTOSCOPY WITH RETROGRADE PYELOGRAM/URETERAL STENT PLACEMENT;  Surgeon: MAlexis Frock MD;  Location: [facility];  Service: Urology;  Laterality: Bilateral;   CYSTOSCOPY WITH RETROGRADE PYELOGRAM, URETEROSCOPY AND STENT PLACEMENT Left  06/24/2014   Procedure: CYSTOSCOPY WITH RETROGRADE PYELOGRAM,  AND STENT PLACEMENT;  Surgeon: Sharyn Creamer, MD;  Location: [facility];  Service: Urology;  Laterality: Left;   CYSTOSCOPY WITH RETROGRADE PYELOGRAM, URETEROSCOPY AND STENT PLACEMENT Left 07/05/2014   Procedure: CYSTO/LEFT URETEROSCOPY/LEFT RETROGRADE  PYELOGRAM/LEFT STENT PLACEMENT;  Surgeon: Festus Aloe, MD;  Location: [facility];  Service: Urology;  Laterality: Left;   CYSTOSCOPY WITH RETROGRADE PYELOGRAM, URETEROSCOPY AND STENT PLACEMENT Left 07/06/2015   Procedure: CYSTOSCOPY WITH RETROGRADE PYELOGRAM, URETEROSCOPY , LASER, STENT PLACEMENT and BASKET EXTRACTION;  Surgeon: Festus Aloe, MD;  Location: [facility];  Service: Urology;  Laterality: Left;   CYSTOSCOPY WITH RETROGRADE PYELOGRAM, URETEROSCOPY AND STENT PLACEMENT Left 08/19/2015   Procedure: CYSTOSCOPY WITH RETROGRADE PYELOGRAM, URETEROSCOPY AND STENT PLACEMENT;  Surgeon: Festus Aloe, MD;  Location: [facility];  Service: Urology;  Laterality: Left;   CYSTOSCOPY WITH RETROGRADE PYELOGRAM, URETEROSCOPY AND STENT PLACEMENT Left 03/13/2018   Procedure: CYSTOSCOPY WITH RETROGRADE PYELOGRAM, URETEROSCOPY AND LEFT STENT PLACEMENT;  Surgeon: Irine Seal, MD;  Location: [facility];  Service: Urology;  Laterality: Left;   CYSTOSCOPY WITH RETROGRADE PYELOGRAM, URETEROSCOPY AND STENT PLACEMENT Left 03/11/2021   Procedure: CYSTOSCOPY WITH RETROGRADE PYELOGRAM, URETEROSCOPY AND STENT PLACEMENT,STONE [place];  Surgeon: Franchot Gallo, MD;  Location: [facility];  Service: Urology;  Laterality: Left;   CYSTOSCOPY WITH RETROGRADE PYELOGRAM, URETEROSCOPY AND STENT PLACEMENT Bilateral 05/06/2021   Procedure: CYSTOSCOPY WITH RETROGRADE PYELOGRAM, URETEROSCOPY AND STENT REPLACEMENT;  Surgeon: Alexis Frock, MD;  Location: [facility];  Service: Urology;  Laterality: Bilateral;  90 MINS   CYSTOSCOPY WITH URETEROSCOPY AND STENT PLACEMENT Left 01/16/2016   Procedure: CYSTOSCOPY WITH LEFT RETROGRADE PYELOGRAM  LEFT DIGITAL URETEROSCOPY AND PLACEMENT LEFT URETERAL STENT;  Surgeon: Festus Aloe, MD;  Location: [facility];  Service: Urology;  Laterality: Left;   CYSTOSCOPY WITH URETEROSCOPY, STONE BASKETRY AND STENT PLACEMENT Left 02/13/2016   Procedure: CYSTOSCOPY WITH LEFT  URETEROSCOPY, HOLMIUM LASER AND STENT PLACEMENT;  Surgeon: Festus Aloe, MD;  Location: [facility];  Service: Urology;  Laterality: Left;   CYSTOSCOPY/RETROGRADE/URETEROSCOPY/STONE EXTRACTION WITH BASKET Left 08/08/2015   Procedure: CYSTOSCOPY/RETROGRADE/URETEROSCOPY/STONE EXTRACTION WITH BASKET;  Surgeon: Festus Aloe, MD;  Location[facility];  Service: Urology;  Laterality: Left;   CYSTOSCOPY/URETEROSCOPY/HOLMIUM LASER/STENT PLACEMENT Left 07/30/2016   Procedure: CYSTOSCOPY/URETEROSCOPY/HOLMIUM LASER/STENT PLACEMENT;  Surgeon: Festus Aloe, MD;  Location: [facility];  Service: Urology;  Laterality: Left;   CYSTOSCOPY/URETEROSCOPY/HOLMIUM LASER/STENT PLACEMENT Left 08/19/2017   Procedure: CYSTOSCOPY/URETEROSCOPY/HOLMIUM LASER/STENT PLACEMENT;  Surgeon: Festus Aloe, MD;  Location: [facility];  Service: Urology;  Laterality: Left;   CYSTOSCOPY/URETEROSCOPY/HOLMIUM LASER/STENT PLACEMENT N/A 10/13/2018   Procedure: CYSTOSCOPY/RETROGRADE RIGHT URETEROSCOPY/ AND RIGHTSTENT PLACEMENT;  Surgeon: Irine Seal, MD;  Location: [facility];  Service: Urology;  Laterality: N/A;   EXTRACORPOREAL SHOCK WAVE LITHOTRIPSY Left 07-07-2015  &  12-26-2014   FOOT CAPSULE RELEASE W/ PERCUTANEOUS HEEL CORD LENGTHENING, TIBIAL TENDON TRANSFER Left 1994   clubfoot   HOLMIUM LASER APPLICATION Left 50/93/2671   Procedure: LASER LITHO;  Surgeon: Festus Aloe, MD;  Location: [facility];  Service: Urology;  Laterality: Left;   HOLMIUM LASER APPLICATION Left 24/58/0998   Procedure: HOLMIUM LASER  WITH LITHOTRIPSY ;  Surgeon: Festus Aloe, MD;  Location: [facility];  Service: Urology;  Laterality: Left;   HOLMIUM LASER APPLICATION Left 33/82/5053   Procedure: HOLMIUM LASER APPLICATION;  Surgeon: Irine Seal, MD;  Location: [facility];  Service: Urology;  Laterality: Left;   HOLMIUM LASER APPLICATION Left 97/67/3419  Procedure: HOLMIUM  LASER APPLICATION;  Surgeon: Irine Seal, MD;  Location: [facility];  Service: Urology;  Laterality: Left;   HOLMIUM LASER APPLICATION Bilateral 68/61/6837   Procedure: HOLMIUM LASER APPLICATION;  Surgeon: Alexis Frock, MD;  Location: [facility];  Service: Urology;  Laterality: Bilateral;   lumpectomy in neck for cat scratch fever      surgery for club foot at few days old   LYMPH GLAND EXCISION  2003   neck--  benign   SVT ABLATION N/A 05/22/2018   Procedure: SVT ABLATION;  Surgeon: Evans Lance, MD;  Location: [facility];  Service: Cardiovascular;  Laterality: N/A;   SVT ABLATION     2019    URETEROSCOPY WITH HOLMIUM LASER LITHOTRIPSY Left 08/04/2021   Procedure: URETEROSCOPY WITH basket removal of stones/ STENT exchange/retrograde;  Surgeon: Festus Aloe, MD;  Location: [facility];  Service: Urology;  Laterality: Left;  ONLY NEEDS 104 MIN    Family History  Problem Relation Age of Onset   Cancer Father    Kidney Stones Mother    Cancer Other    Hypertension Other    Hyperlipidemia Other    Stroke Other    Kidney Stones Brother    Social History:  reports that he quit smoking about 7 years ago. His smoking use included cigarettes. He has never used smokeless tobacco. He reports that he does not currently use alcohol. He reports that he does not use drugs.  Allergies:  Allergies  Allergen Reactions   Bactrim [Sulfamethoxazole-Trimethoprim] Nausea And Vomiting   Ketamine Other (See Comments)    Did not like the sensation    No medications prior to admission.    No results found for this or any previous visit (from the past 48 hour(s)). No results found.  Review of Systems  Constitutional:  Negative for chills and fatigue.  Genitourinary:  Positive for urgency.  All other systems reviewed and are negative.  Height '5\' 8"'  (1.727 m), weight 104 kg. Physical Exam Vitals reviewed.  HENT:     Head: Normocephalic.     Nose: Nose normal.  Eyes:      Pupils: Pupils are equal, round, and reactive to light.  Cardiovascular:     Rate and Rhythm: Normal rate.  Pulmonary:     Effort: Pulmonary effort is normal.  Abdominal:     General: Abdomen is flat.  Genitourinary:    Comments: No CVAT Musculoskeletal:        General: Normal range of motion.     Cervical back: Normal range of motion.  Skin:    General: Skin is warm.  Neurological:     General: No focal deficit present.     Mental Status: He is alert.  Psychiatric:        Mood and Affect: Mood normal.     Assessment/Plan  Proceed as planned with BILATERAL ureteroscopic stone manipulation / stent exchange with goal of stone free. Risks, benefits, alternatives, expected peri-op course discussed previously and reiterated today.   Alexis Frock, MD 05/19/2022, 8:34 AM

## 2022-05-19 NOTE — Discharge Instructions (Addendum)
1 - You may have urinary urgency (bladder spasms) and bloody urine on / off with stent in place. This is normal. ? ?2 - Call MD or go to ER for fever >102, severe pain / nausea / vomiting not relieved by medications, or acute change in medical status ? ? ?[facility] ?[phone] ?Post Ureteroscopy With or Without Stent Instructions ? ?Definitions: ? ?Ureter: The duct that transports urine from the kidney to the bladder. ?Stent:   A plastic hollow tube that is placed into the ureter, from the kidney to the bladder to prevent the ureter from swelling shut. ? ?GENERAL INSTRUCTIONS: ? ?Despite the fact that no skin incisions were used, the area around the ureter and bladder is raw and irritated. The stent is a foreign body which will further irritate the bladder wall. This irritation is manifested by increased frequency of urination, both day and night, and by an increase in the urge to urinate. In some, the urge to urinate is present almost always. Sometimes the urge is strong enough that you may not be able to stop yourself from urinating. The only real cure is to remove the stent and then give time for the bladder wall to heal which can't be done until the danger of the ureter swelling shut has passed, which varies. ? ?You may see some blood in your urine while the stent is in place and a few days afterwards. Do not be alarmed, even if the urine was clear for a while. Get off your feet and drink lots of fluids until clearing occurs. If you start to pass clots or don't improve, call us. ? ?DIET: ?You may return to your normal diet immediately. Because of the raw surface of your bladder, alcohol, spicy foods, acid type foods and drinks with caffeine may cause irritation or frequency and should be used in moderation. To keep your urine flowing freely and to avoid constipation, drink plenty of fluids during the day ( 8-10 glasses ). ?Tip: Avoid cranberry juice because it is very  acidic. ? ?ACTIVITY: ?Your physical activity doesn't need to be restricted. However, if you are very active, you may see some blood in your urine. We suggest that you reduce your activity under these circumstances until the bleeding has stopped. ? ?BOWELS: ?It is important to keep your bowels regular during the postoperative period. Straining with bowel movements can cause bleeding. A bowel movement every other day is reasonable. Use a mild laxative if needed, such as Milk of Magnesia 2-3 tablespoons, or 2 Dulcolax tablets. Call if you continue to have problems. If you have been taking narcotics for pain, before, during or after your surgery, you may be constipated. Take a laxative if necessary. ? ? ?MEDICATION: ?You should resume your pre-surgery medications unless told not to. In addition you will often be given an antibiotic to prevent infection. These should be taken as prescribed until the bottles are finished unless you are having an unusual reaction to one of the drugs. ? ?PROBLEMS YOU SHOULD REPORT TO US: ?Fevers over 100.5 Fahrenheit. ?Heavy bleeding, or clots ( See above notes about blood in urine ). ?Inability to urinate. ?Drug reactions ( hives, rash, nausea, vomiting, diarrhea ). ?Severe burning or pain with urination that is not improving. ? ?FOLLOW-UP: ?You will need a follow-up appointment to monitor your progress. Call for this appointment at the number listed above. Usually the first appointment will be about three to fourteen days after your surgery. ? ? ? ?  ?  Post Anesthesia Home Care Instructions ? ?Activity: ?Get plenty of rest for the remainder of the day. A responsible individual must stay with you for 24 hours following the procedure.  ?For the next 24 hours, DO NOT: ?-Drive a car ?-Operate machinery ?-Drink alcoholic beverages ?-Take any medication unless instructed by your physician ?-Make any legal decisions or sign important papers. ? ?Meals: ?Start with liquid foods such as gelatin or  soup. Progress to regular foods as tolerated. Avoid greasy, spicy, heavy foods. If nausea and/or vomiting occur, drink only clear liquids until the nausea and/or vomiting subsides. Call your physician if vomiting continues. ? ?Special Instructions/Symptoms: ?Your throat may feel dry or sore from the anesthesia or the breathing tube placed in your throat during surgery. If this causes discomfort, gargle with warm salt water. The discomfort should disappear within 24 hours. ? ? ?    ?

## 2022-05-19 NOTE — Anesthesia Preprocedure Evaluation (Signed)
Anesthesia Evaluation  Patient identified by MRN, date of birth, ID band Patient awake    Reviewed: Allergy & Precautions, NPO status , Patient's Chart, lab work & pertinent test results  History of Anesthesia Complications (+) PONV and history of anesthetic complications  Airway Mallampati: II  TM Distance: >3 FB Neck ROM: Full    Dental no notable dental hx. (+) Teeth Intact, Dental Advisory Given   Pulmonary neg pulmonary ROS, former smoker,    Pulmonary exam normal breath sounds clear to auscultation       Cardiovascular Normal cardiovascular exam+ dysrhythmias (s/p ablation) Supra Ventricular Tachycardia  Rhythm:Regular Rate:Normal     Neuro/Psych  Headaches, PSYCHIATRIC DISORDERS Anxiety    GI/Hepatic negative GI ROS, Neg liver ROS,   Endo/Other  negative endocrine ROS  Renal/GU negative Renal ROS  negative genitourinary   Musculoskeletal negative musculoskeletal ROS (+)   Abdominal   Peds  Hematology negative hematology ROS (+)   Anesthesia Other Findings   Reproductive/Obstetrics                            Anesthesia Physical Anesthesia Plan  ASA: 2  Anesthesia Plan: General   Post-op Pain Management: Tylenol PO (pre-op)*   Induction: Intravenous  PONV Risk Score and Plan: 3 and Ondansetron, Dexamethasone and Midazolam  Airway Management Planned: LMA  Additional Equipment:   Intra-op Plan:   Post-operative Plan: Extubation in OR  Informed Consent: I have reviewed the patients History and Physical, chart, labs and discussed the procedure including the risks, benefits and alternatives for the proposed anesthesia with the patient or authorized representative who has indicated his/her understanding and acceptance.     Dental advisory given  Plan Discussed with: CRNA  Anesthesia Plan Comments:         Anesthesia Quick Evaluation

## 2022-05-19 NOTE — Anesthesia Procedure Notes (Signed)
Procedure Name: LMA Insertion Date/Time: 05/19/2022 1:33 PM Performed by: Dairl Ponder, CRNA Pre-anesthesia Checklist: Patient identified, Emergency Drugs available, Suction available and Patient being monitored Patient Re-evaluated:Patient Re-evaluated prior to induction Oxygen Delivery Method: Circle System Utilized Preoxygenation: Pre-oxygenation with 100% oxygen Induction Type: IV induction Ventilation: Mask ventilation without difficulty LMA: LMA inserted LMA Size: 4.0 Number of attempts: 1 Airway Equipment and Method: Bite block Placement Confirmation: positive ETCO2 Tube secured with: Tape Dental Injury: Teeth and Oropharynx as per pre-operative assessment

## 2022-05-19 NOTE — Op Note (Unsigned)
NAME: Thomas Howell, Thomas Howell MEDICAL RECORD NO: [id number] ACCOUNT NO: [id number] DATE OF BIRTH: 08-18-1991 FACILITY: [facility] LOCATION: [facility] PHYSICIAN: Alexis Frock, MD  Operative Report   SURGEON:  Alexis Frock, MD  PREOPERATIVE DIAGNOSES:  Left ureteral, bilateral renal stones, left greater than right.  PROCEDURE PERFORMED: 1.  Cystoscopy with bilateral retrograde pyelograms interpretation. 2.  Bilateral ureteroscopy with laser lithotripsy, exchange of stents, first stage.  ESTIMATED BLOOD LOSS:  Nil.  COMPLICATIONS:  None.  SPECIMEN:  Bilateral renal and ureteral stone fragments for composition analysis.  FINDINGS: 1.  Very large left greater than right hydronephrosis. 2.  Large volume left greater than right ureterolithiasis estimated approximately 2 cm total volume on the left, 1 cm on the right. 3.  Successful removal or ablation of estimated 60-65% of stone volume today. 4.  Successful replacement of bilateral ureteral stents, proximal end in the renal pelvis and distal end in urinary bladder.  INDICATIONS:  The patient is a pleasant, but unfortunate 31 year old young man with history of very high risk urolithiasis, status post multiple prior colic episodes.  He was found on workup of colicky flank pain that was severe to have left ureteral,  bilateral renal stone fragments and significant left hydronephrosis.  He underwent bilateral stenting as a temporizing measure and presents now for definitive ureteroscopy.  Informed consent was obtained and placed in medical record.  PROCEDURE IN DETAIL:  The patient being verified, procedure being bilateral ureteroscopic stone manipulation was confirmed.  Procedure timeout was performed.  Intravenous antibiotics were administered and general LMA anesthesia was induced.  The patient  was placed into the low lithotomy position.  Sterile field was created, prepped and draped the patient's penis, perineum, and proximal thighs using  iodine.  Cystourethroscopy was performed using 21-French rigid cystoscope with offset lens.  Inspection of  anterior and posterior urethra was unremarkable.  Inspection of urinary bladder revealed distal end of bilateral ureteral stents in situ. Left stent was grasped, brought to the level of the urethral meatus, navigated out in its entirety, set aside for  discard, inspected and intact.  There was mild encrustation of the proximal curl.  Similarly, right stent was grasped in its entirety, inspected and intact, set aside for discard.  Mild encrustation noted.  The right ureteral orifice was cannulated with  6-French end-hole catheter, and right retrograde pyelogram was obtained.  Right retrograde pyelogram demonstrated single right ureter, single system right kidney.  No filling defects or narrowing noted.  A 0.038 ZIPwire was advanced to the level of the upper pole, set aside as a safety wire.  Next, left retrograde pyelogram  was obtained.  Left retrograde pyelogram demonstrated single left ureter and single system left kidney.  There was a filling defect in the mid ureter consistent with known stone with significant hydronephrosis above this.  0.038 ZIPwire was advanced to the level of the  upper pole, set aside as a safety wire.  An 8-French feeding tube placed in the urinary bladder for pressure release.  Next, semirigid ureteroscopy performed of the distal two-thirds right ureter alongside a separate sensor working wire.  No mucosal  abnormalities were found.  Next, semirigid ureteroscopy performed of the distal two-thirds left ureter alongside a separate sensor working wire. At the most upper reaches of the ureteroscope, the ureteral stone volume in question was just barely  visualized, but not accessible. As such, the semirigid scope was exchanged for a 12/14 medium length ureteral access sheath to the level of the proximal ureter using  continuous fluoroscopic guidance, taking exquisite care to  pass the scope any further  than the previously visualized portions and flexible digital ureteroscopy was performed of the proximal left ureter.  There was a significant column of the impacted stones as anticipated.  These were much too large for simple basketing.  As such, holmium  laser energy applied to the stone using settings of 0.2 joules and 20 Hz and approximately 50% of the stone was dusted, 50% fragmented with the fragments being amenable to simple basketing with the Escape basket.  Further ureteroscopy to the level of  the kidney revealed a massively hydronephrotic kidney with a copious amount of proteinaceous debris.  This was irrigated in 60 mL aliquots times approximately 10 just to allow for sufficient visualization, which even then remained nonoptimal. Careful  inspection of the hydronephrotic kidney revealed multifocal calcifications, mostly in lower pole and lower mid calix.  These were sequentially grasped and brought together in upper calix to allow for less acute angulation and holmium laser energy applied  to the stone using settings again of 0.2 joules and 20 Hz and approximately 50% of stone dusted, 50% fragmented.  The fragments were carefully grasped and removed in their entirety and the stones were removed. After approximately an hour of cycles  of laser lithotripsy and basketing, visualization became sufficiently poor just from the adherent manipulation that it was not felt to be safe to proceed and given the large volume of stone that a staged approach would be most prudent to achieve his goal  of stone free. As such, the access sheath was removed under continuous vision, no significant mucosal abnormalities were found and the access sheath was placed over the right sensor working wire to the level of proximal right ureter using continuous  fluoroscopic guidance and flexible digital ureteroscopy was performed of the proximal right ureter and systematic inspection of the right  kidney.  There were multifocal papillary tip calcifications noted of much smaller volume than on the left.  These  were similarly amenable to holmium laser energy using settings of 0.2 joules and 20 Hz and approximately 50% of the stone dusted, 50% fragmented.  Estimated total stone volume approximately 1 cm on the right side and were able to be removed with the  basket.  Access sheath was removed under continuous vision, no significant mucosal abnormalities were found.  Given, however, the very complex nature of his stone disease with a very large volume left greater than right and a massively hydronephrotic  left kidney, making visualization excessively exceptionally poor, again it was felt that a staged approach would be most prudent including the first stage today.  As such, a new 5 x 26 Polaris type stents were placed over safety wires using fluoroscopic  guidance.  Good proximal and distal planes were noted.  Procedure was terminated.  The patient tolerated the procedure well, no immediate perioperative complications.  The patient was taken to postanesthesia care unit in stable condition.  Plan for discharge home.  He will be scheduled for second stage procedure in approximately 3 weeks, again to achieve a goal of stone free followed by more aggressive medical therapy.   NIK D: 05/19/2022 3:05:08 pm T: 05/19/2022 11:34:00 pm  JOB: [id number]

## 2022-05-20 ENCOUNTER — Encounter (HOSPITAL_BASED_OUTPATIENT_CLINIC_OR_DEPARTMENT_OTHER): Payer: Self-pay | Admitting: Urology

## 2022-05-20 NOTE — Anesthesia Postprocedure Evaluation (Signed)
Anesthesia Post Note  Patient: Thomas Howell  Procedure(s) Performed: CYSTOSCOPY WITH RETROGRADE PYELOGRAM, URETEROSCOPY AND STENT  EXCHANGE (Bilateral: Ureter) HOLMIUM LASER APPLICATION (Bilateral: Ureter)     Patient location during evaluation: PACU Anesthesia Type: General Level of consciousness: awake and alert Pain management: pain level controlled Vital Signs Assessment: post-procedure vital signs reviewed and stable Respiratory status: spontaneous breathing, nonlabored ventilation, respiratory function stable and patient connected to nasal cannula oxygen Cardiovascular status: blood pressure returned to baseline and stable Postop Assessment: no apparent nausea or vomiting Anesthetic complications: no   No notable events documented.  Last Vitals:  Vitals:   05/19/22 1700 05/19/22 1735  BP: (!) 131/93 (!) 141/99  Pulse: 87 71  Resp: (!) 24 14  Temp:  37.2 C  SpO2: 96% 97%    Last Pain:  Vitals:   05/19/22 1735  TempSrc:   PainSc: 9                  Angelica Frandsen L Rahil Passey

## 2022-05-21 ENCOUNTER — Other Ambulatory Visit: Payer: Self-pay | Admitting: Urology

## 2022-05-24 ENCOUNTER — Other Ambulatory Visit: Payer: Self-pay | Admitting: Nurse Practitioner

## 2022-05-25 DIAGNOSIS — Z79891 Long term (current) use of opiate analgesic: Secondary | ICD-10-CM | POA: Diagnosis not present

## 2022-05-25 DIAGNOSIS — F4542 Pain disorder with related psychological factors: Secondary | ICD-10-CM | POA: Diagnosis not present

## 2022-05-25 DIAGNOSIS — G8929 Other chronic pain: Secondary | ICD-10-CM | POA: Diagnosis not present

## 2022-05-25 DIAGNOSIS — F1120 Opioid dependence, uncomplicated: Secondary | ICD-10-CM | POA: Diagnosis not present

## 2022-05-27 ENCOUNTER — Encounter: Payer: Self-pay | Admitting: Nurse Practitioner

## 2022-05-31 NOTE — Patient Instructions (Addendum)
DUE TO COVID-19 ONLY TWO VISITORS  (aged 30 and older)  ARE ALLOWED TO COME WITH YOU AND STAY IN THE WAITING ROOM ONLY DURING PRE OP AND PROCEDURE.   **NO VISITORS ARE ALLOWED IN THE SHORT STAY AREA OR RECOVERY ROOM!!**  IF YOU WILL BE ADMITTED INTO THE HOSPITAL YOU ARE ALLOWED ONLY FOUR SUPPORT PEOPLE DURING VISITATION HOURS ONLY (7 AM -8PM)   The support person(s) must pass our screening, gel in and out, and wear a mask at all times, including in the patient's room. Patients must also wear a mask when staff or their support person are in the room. Visitors GUEST BADGE MUST BE WORN VISIBLY  One adult visitor may remain with you overnight and MUST be in the room by 8 P.M.     Your procedure is scheduled on: 06/09/22   Report to [facility] Main Entrance    Report to admitting at : 1:15 PM   Call this number if you have problems the morning of surgery [phone]   Do not eat food :After Midnight.   After Midnight you may have the following liquids until : 12:30 PM DAY OF SURGERY  Water Black Coffee (sugar ok, NO MILK/CREAM OR CREAMERS)  Tea (sugar ok, NO MILK/CREAM OR CREAMERS) regular and decaf                             Plain Jell-O (NO RED)                                           Fruit ices (not with fruit pulp, NO RED)                                     Popsicles (NO RED)                                                                  Juice: apple, WHITE grape, WHITE cranberry Sports drinks like Gatorade (NO RED) Clear broth(vegetable,chicken,beef)    Oral Hygiene is also important to reduce your risk of infection.                                    Remember - BRUSH YOUR TEETH THE MORNING OF SURGERY WITH YOUR REGULAR TOOTHPASTE   Do NOT smoke after Midnight   Take these medicines the morning of surgery with A SIP OF WATER: N/A  DO NOT TAKE ANY ORAL DIABETIC MEDICATIONS DAY OF YOUR SURGERY  Bring CPAP mask and tubing day of surgery.                               You may not have any metal on your body including hair pins, jewelry, and body piercing             Do not wear lotions, powders, perfumes/cologne, or deodorant  Men may shave face and neck.   Do not bring valuables to the hospital. [place].   Contacts, dentures or bridgework may not be worn into surgery.   Bring small overnight bag day of surgery.   DO NOT [place]. PHARMACY WILL DISPENSE MEDICATIONS LISTED ON YOUR MEDICATION LIST TO YOU DURING YOUR ADMISSION [place]!    Patients discharged on the day of surgery will not be allowed to drive home.  Someone NEEDS to stay with you for the first 24 hours after anesthesia.   Special Instructions: Bring a copy of your healthcare power of attorney and living will documents         the day of surgery if you haven't scanned them before.              Please read over the following fact sheets you were given: IF YOU HAVE QUESTIONS ABOUT YOUR PRE-OP INSTRUCTIONS PLEASE CALL [phone]     [facility] - Preparing for Surgery Before surgery, you can play an important role.  Because skin is not sterile, your skin needs to be as free of germs as possible.  You can reduce the number of germs on your skin by washing with CHG (chlorahexidine gluconate) soap before surgery.  CHG is an antiseptic cleaner which kills germs and bonds with the skin to continue killing germs even after washing. Please DO NOT use if you have an allergy to CHG or antibacterial soaps.  If your skin becomes reddened/irritated stop using the CHG and inform your nurse when you arrive at Short Stay. Do not shave (including legs and underarms) for at least 48 hours prior to the first CHG shower.  You may shave your face/neck. Please follow these instructions carefully:  1.  Shower with CHG Soap the night before surgery and the  morning of Surgery.  2.  If you choose to wash  your hair, wash your hair first as usual with your  normal  shampoo.  3.  After you shampoo, rinse your hair and body thoroughly to remove the  shampoo.                           4.  Use CHG as you would any other liquid soap.  You can apply chg directly  to the skin and wash                       Gently with a scrungie or clean washcloth.  5.  Apply the CHG Soap to your body ONLY FROM THE NECK DOWN.   Do not use on face/ open                           Wound or open sores. Avoid contact with eyes, ears mouth and genitals (private parts).                       Wash face,  Genitals (private parts) with your normal soap.             6.  Wash thoroughly, paying special attention to the area where your surgery  will be performed.  7.  Thoroughly rinse your body with warm water from the neck down.  8.  DO NOT shower/wash with your normal soap after using and rinsing off  the CHG Soap.                9.  Pat yourself dry with a clean towel.            10.  Wear clean pajamas.            11.  Place clean sheets on your bed the night of your first shower and do not  sleep with pets. Day of Surgery : Do not apply any lotions/deodorants the morning of surgery.  Please wear clean clothes to the hospital/surgery center.  FAILURE TO FOLLOW THESE INSTRUCTIONS MAY RESULT IN THE CANCELLATION OF YOUR SURGERY PATIENT SIGNATURE_________________________________  NURSE SIGNATURE__________________________________  ________________________________________________________________________

## 2022-06-01 ENCOUNTER — Encounter (HOSPITAL_COMMUNITY): Payer: Self-pay

## 2022-06-01 ENCOUNTER — Encounter (HOSPITAL_COMMUNITY)
Admission: RE | Admit: 2022-06-01 | Discharge: 2022-06-01 | Disposition: A | Discharge status: Home / Self Care | Payer: BC Managed Care – PPO | Source: Ambulatory Visit | Attending: Anesthesiology | Admitting: Anesthesiology

## 2022-06-01 DIAGNOSIS — I471 Supraventricular tachycardia: Secondary | ICD-10-CM

## 2022-06-03 ENCOUNTER — Other Ambulatory Visit: Payer: Self-pay

## 2022-06-03 ENCOUNTER — Encounter (HOSPITAL_COMMUNITY): Payer: Self-pay

## 2022-06-03 ENCOUNTER — Encounter (HOSPITAL_COMMUNITY)
Admission: RE | Admit: 2022-06-03 | Discharge: 2022-06-03 | Disposition: A | Discharge status: Home / Self Care | Payer: BC Managed Care – PPO | Source: Ambulatory Visit | Attending: Urology | Admitting: Urology

## 2022-06-03 DIAGNOSIS — Z01818 Encounter for other preprocedural examination: Secondary | ICD-10-CM | POA: Insufficient documentation

## 2022-06-03 DIAGNOSIS — I471 Supraventricular tachycardia: Secondary | ICD-10-CM | POA: Diagnosis not present

## 2022-06-03 LAB — CBC
HCT: 40.5 % (ref 39.0–52.0)
Hemoglobin: 13.6 g/dL (ref 13.0–17.0)
MCH: 28.7 pg (ref 26.0–34.0)
MCHC: 33.6 g/dL (ref 30.0–36.0)
MCV: 85.4 fL (ref 80.0–100.0)
Platelets: 311 10*3/uL (ref 150–400)
RBC: 4.74 MIL/uL (ref 4.22–5.81)
RDW: 11.9 % (ref 11.5–15.5)
WBC: 7.8 10*3/uL (ref 4.0–10.5)
nRBC: 0 % (ref 0.0–0.2)

## 2022-06-03 LAB — BASIC METABOLIC PANEL
Anion gap: 6 (ref 5–15)
BUN: 12 mg/dL (ref 6–20)
CO2: 27 mmol/L (ref 22–32)
Calcium: 9.8 mg/dL (ref 8.9–10.3)
Chloride: 107 mmol/L (ref 98–111)
Creatinine, Ser: 1.06 mg/dL (ref 0.61–1.24)
GFR, Estimated: >60 mL/min (ref >60)
Glucose, Bld: 99 mg/dL (ref 70–99)
Potassium: 4.4 mmol/L (ref 3.5–5.1)
Sodium: 140 mmol/L (ref 135–145)

## 2022-06-03 NOTE — Progress Notes (Signed)
For Short Stay: COVID SWAB appointment date: Date of COVID positive in last 90 days:  Bowel Prep reminder:   For Anesthesia: PCP - DO: Everlene Other Cardiologist - Dr. Lewayne Bunting  Chest x-ray -  EKG -  Stress Test -  ECHO -  Cardiac Cath -  Pacemaker/ICD device last checked: Pacemaker orders received: Device Rep notified:  Spinal Cord Stimulator:  Sleep Study -  CPAP -   Fasting Blood Sugar -  Checks Blood Sugar _____ times a day Date and result of last Hgb A1c-  Blood Thinner Instructions: Aspirin Instructions: Last Dose:  Activity level: Can go up a flight of stairs and activities of daily living without stopping and without chest pain and/or shortness of breath   Able to exercise without chest pain and/or shortness of breath   Unable to go up a flight of stairs without chest pain and/or shortness of breath     Anesthesia review: Hx: Dysrhythmias.  Patient denies shortness of breath, fever, cough and chest pain at PAT appointment   Patient verbalized understanding of instructions that were given to them at the PAT appointment. Patient was also instructed that they will need to review over the PAT instructions again at home before surgery.

## 2022-06-04 ENCOUNTER — Telehealth: Payer: BC Managed Care – PPO | Admitting: Nurse Practitioner

## 2022-06-08 DIAGNOSIS — G8929 Other chronic pain: Secondary | ICD-10-CM | POA: Diagnosis not present

## 2022-06-08 DIAGNOSIS — F1120 Opioid dependence, uncomplicated: Secondary | ICD-10-CM | POA: Diagnosis not present

## 2022-06-08 DIAGNOSIS — F4321 Adjustment disorder with depressed mood: Secondary | ICD-10-CM | POA: Diagnosis not present

## 2022-06-08 DIAGNOSIS — F4542 Pain disorder with related psychological factors: Secondary | ICD-10-CM | POA: Diagnosis not present

## 2022-06-08 DIAGNOSIS — Z79891 Long term (current) use of opiate analgesic: Secondary | ICD-10-CM | POA: Diagnosis not present

## 2022-06-08 MED ORDER — GENTAMICIN SULFATE 40 MG/ML IJ SOLN
5.0000 mg/kg | INTRAVENOUS | Status: AC
  Administered 2022-06-09: 400 mg via INTRAVENOUS
  Filled 2022-06-08: qty 10

## 2022-06-09 ENCOUNTER — Encounter (HOSPITAL_COMMUNITY): Payer: Self-pay | Admitting: Urology

## 2022-06-09 ENCOUNTER — Ambulatory Visit (HOSPITAL_COMMUNITY): Payer: BC Managed Care – PPO | Admitting: Anesthesiology

## 2022-06-09 ENCOUNTER — Ambulatory Visit (HOSPITAL_COMMUNITY)
Admission: RE | Admit: 2022-06-09 | Discharge: 2022-06-09 | Disposition: A | Discharge status: Home / Self Care | Payer: BC Managed Care – PPO | Source: Ambulatory Visit | Attending: Urology | Admitting: Urology

## 2022-06-09 ENCOUNTER — Encounter (HOSPITAL_COMMUNITY)
Admission: RE | Disposition: A | Discharge status: Home / Self Care | Payer: Self-pay | Source: Ambulatory Visit | Attending: Urology

## 2022-06-09 ENCOUNTER — Ambulatory Visit (HOSPITAL_COMMUNITY): Payer: BC Managed Care – PPO

## 2022-06-09 DIAGNOSIS — N132 Hydronephrosis with renal and ureteral calculous obstruction: Secondary | ICD-10-CM | POA: Insufficient documentation

## 2022-06-09 DIAGNOSIS — Z87891 Personal history of nicotine dependence: Secondary | ICD-10-CM | POA: Insufficient documentation

## 2022-06-09 DIAGNOSIS — N202 Calculus of kidney with calculus of ureter: Secondary | ICD-10-CM | POA: Diagnosis not present

## 2022-06-09 SURGERY — CYSTOURETEROSCOPY, WITH RETROGRADE PYELOGRAM AND STENT INSERTION
Anesthesia: General | Laterality: Bilateral

## 2022-06-09 MED ORDER — KETOROLAC TROMETHAMINE 30 MG/ML IJ SOLN
30.0000 mg | Freq: Once | INTRAMUSCULAR | Status: AC
  Administered 2022-06-09: 30 mg via INTRAVENOUS

## 2022-06-09 MED ORDER — MIDAZOLAM HCL 2 MG/2ML IJ SOLN
INTRAMUSCULAR | Status: AC
  Filled 2022-06-09: qty 2

## 2022-06-09 MED ORDER — PROMETHAZINE HCL 25 MG/ML IJ SOLN
INTRAMUSCULAR | Status: AC
  Filled 2022-06-09: qty 1

## 2022-06-09 MED ORDER — FENTANYL CITRATE PF 50 MCG/ML IJ SOSY
PREFILLED_SYRINGE | INTRAMUSCULAR | Status: AC
  Filled 2022-06-09: qty 1

## 2022-06-09 MED ORDER — HYDROMORPHONE HCL 1 MG/ML IJ SOLN
INTRAMUSCULAR | Status: AC
  Filled 2022-06-09: qty 1

## 2022-06-09 MED ORDER — OXYCODONE HCL 5 MG/5ML PO SOLN: 5.0000 mg | Freq: Once | ORAL | Status: DC | PRN

## 2022-06-09 MED ORDER — DEXAMETHASONE SODIUM PHOSPHATE 10 MG/ML IJ SOLN
INTRAMUSCULAR | Status: DC | PRN
  Administered 2022-06-09: 10 mg via INTRAVENOUS

## 2022-06-09 MED ORDER — LIDOCAINE 2% (20 MG/ML) 5 ML SYRINGE
INTRAMUSCULAR | Status: DC | PRN
  Administered 2022-06-09: 80 mg via INTRAVENOUS

## 2022-06-09 MED ORDER — PROPOFOL 10 MG/ML IV BOLUS
INTRAVENOUS | Status: DC | PRN
  Administered 2022-06-09: 200 mg via INTRAVENOUS

## 2022-06-09 MED ORDER — AMISULPRIDE (ANTIEMETIC) 5 MG/2ML IV SOLN
10.0000 mg | Freq: Once | INTRAVENOUS | Status: AC | PRN
  Administered 2022-06-09: 10 mg via INTRAVENOUS

## 2022-06-09 MED ORDER — KETOROLAC TROMETHAMINE 30 MG/ML IJ SOLN
INTRAMUSCULAR | Status: AC
  Filled 2022-06-09: qty 1

## 2022-06-09 MED ORDER — MIDAZOLAM HCL 5 MG/5ML IJ SOLN
INTRAMUSCULAR | Status: DC | PRN
  Administered 2022-06-09: 2 mg via INTRAVENOUS

## 2022-06-09 MED ORDER — LACTATED RINGERS IV SOLN: INTRAVENOUS | Status: DC

## 2022-06-09 MED ORDER — IOHEXOL 300 MG/ML  SOLN
INTRAMUSCULAR | Status: DC | PRN
  Administered 2022-06-09: 20 mL

## 2022-06-09 MED ORDER — FENTANYL CITRATE (PF) 100 MCG/2ML IJ SOLN
INTRAMUSCULAR | Status: AC
  Filled 2022-06-09: qty 2

## 2022-06-09 MED ORDER — PROPOFOL 10 MG/ML IV BOLUS
INTRAVENOUS | Status: AC
  Filled 2022-06-09: qty 20

## 2022-06-09 MED ORDER — FENTANYL CITRATE (PF) 100 MCG/2ML IJ SOLN
INTRAMUSCULAR | Status: DC | PRN
Start: 2022-06-09 — End: 2022-06-09
  Administered 2022-06-09 (×6): 50 ug via INTRAVENOUS

## 2022-06-09 MED ORDER — ONDANSETRON HCL 4 MG/2ML IJ SOLN
INTRAMUSCULAR | Status: DC | PRN
  Administered 2022-06-09: 4 mg via INTRAVENOUS

## 2022-06-09 MED ORDER — CHLORHEXIDINE GLUCONATE 0.12 % MT SOLN
15.0000 mL | Freq: Once | OROMUCOSAL | Status: AC
  Administered 2022-06-09: 15 mL via OROMUCOSAL

## 2022-06-09 MED ORDER — ACETAMINOPHEN 10 MG/ML IV SOLN
INTRAVENOUS | Status: AC
  Filled 2022-06-09: qty 100

## 2022-06-09 MED ORDER — FENTANYL CITRATE PF 50 MCG/ML IJ SOSY
PREFILLED_SYRINGE | INTRAMUSCULAR | Status: AC
  Filled 2022-06-09: qty 2

## 2022-06-09 MED ORDER — FENTANYL CITRATE PF 50 MCG/ML IJ SOSY
25.0000 ug | PREFILLED_SYRINGE | INTRAMUSCULAR | Status: DC | PRN
  Administered 2022-06-09 (×3): 50 ug via INTRAVENOUS

## 2022-06-09 MED ORDER — OXYCODONE HCL 5 MG PO TABS: 5.0000 mg | ORAL_TABLET | Freq: Once | ORAL | Status: DC | PRN

## 2022-06-09 MED ORDER — AMISULPRIDE (ANTIEMETIC) 5 MG/2ML IV SOLN
INTRAVENOUS | Status: AC
  Filled 2022-06-09: qty 4

## 2022-06-09 MED ORDER — ACETAMINOPHEN 10 MG/ML IV SOLN
1000.0000 mg | Freq: Once | INTRAVENOUS | Status: DC | PRN
  Administered 2022-06-09: 1000 mg via INTRAVENOUS

## 2022-06-09 MED ORDER — ORAL CARE MOUTH RINSE: 15.0000 mL | Freq: Once | OROMUCOSAL | Status: AC

## 2022-06-09 MED ORDER — CEPHALEXIN 500 MG PO CAPS: 500.0000 mg | ORAL_CAPSULE | Freq: Two times a day (BID) | ORAL | 0 refills | Status: AC

## 2022-06-09 MED ORDER — PROMETHAZINE HCL 25 MG/ML IJ SOLN
6.2500 mg | Freq: Once | INTRAMUSCULAR | Status: AC
  Administered 2022-06-09: 6.25 mg via INTRAVENOUS

## 2022-06-09 MED ORDER — SODIUM CHLORIDE 0.9 % IR SOLN
Status: DC | PRN
  Administered 2022-06-09: 6000 mL

## 2022-06-09 MED ORDER — KETOROLAC TROMETHAMINE 10 MG PO TABS: 10.0000 mg | ORAL_TABLET | Freq: Three times a day (TID) | ORAL | 0 refills | Status: DC | PRN

## 2022-06-09 MED ORDER — HYDROCODONE-ACETAMINOPHEN 5-325 MG PO TABS: 1.0000 | ORAL_TABLET | Freq: Four times a day (QID) | ORAL | 0 refills | Status: AC | PRN

## 2022-06-09 MED ORDER — HYDROMORPHONE HCL 1 MG/ML IJ SOLN
0.2500 mg | INTRAMUSCULAR | Status: DC | PRN
  Administered 2022-06-09 (×4): 0.5 mg via INTRAVENOUS

## 2022-06-09 SURGICAL SUPPLY — 26 items
BAG URO CATCHER STRL LF (MISCELLANEOUS) ×2 IMPLANT
BASKET LASER NITINOL 1.9FR (BASKET) ×2 IMPLANT
CATH URETL OPEN END 6FR 70 (CATHETERS) ×2 IMPLANT
CLOTH BEACON ORANGE TIMEOUT ST (SAFETY) ×2 IMPLANT
EXTRACTOR STONE 1.7FRX115CM (UROLOGICAL SUPPLIES) IMPLANT
FIBER LASER TRAC TIP (UROLOGICAL SUPPLIES) ×1 IMPLANT
GLOVE SURG LX 7.5 STRW (GLOVE) ×1
GLOVE SURG LX STRL 7.5 STRW (GLOVE) ×1 IMPLANT
GOWN SRG XL LVL 4 BRTHBL STRL (GOWNS) ×1 IMPLANT
GOWN STRL NON-REIN XL LVL4 (GOWNS) ×1
GUIDEWIRE ANG ZIPWIRE 038X150 (WIRE) ×3 IMPLANT
GUIDEWIRE STR DUAL SENSOR (WIRE) ×3 IMPLANT
KIT TURNOVER KIT A (KITS) IMPLANT
LASER FIB FLEXIVA PULSE ID 365 (Laser) IMPLANT
LASER FIB FLEXIVA PULSE ID 550 (Laser) IMPLANT
LASER FIB FLEXIVA PULSE ID 910 (Laser) IMPLANT
MANIFOLD NEPTUNE II (INSTRUMENTS) ×2 IMPLANT
PACK CYSTO (CUSTOM PROCEDURE TRAY) ×2 IMPLANT
SHEATH NAVIGATOR HD 11/13X28 (SHEATH) IMPLANT
SHEATH NAVIGATOR HD 11/13X36 (SHEATH) ×1 IMPLANT
STENT POLARIS 5FRX26 (STENTS) ×2 IMPLANT
TRACTIP FLEXIVA PULS ID 200XHI (Laser) IMPLANT
TRACTIP FLEXIVA PULSE ID 200 (Laser)
TUBE FEEDING 8FR 16IN STR KANG (MISCELLANEOUS) ×2 IMPLANT
TUBING CONNECTING 10 (TUBING) ×2 IMPLANT
TUBING UROLOGY SET (TUBING) ×2 IMPLANT

## 2022-06-09 NOTE — H&P (Signed)
Thomas Howell is an 31 y.o. male.    Chief Complaint: Pre-OP BILATERAL 2nd Stage Ureteroscopic Stone Manipulation  HPI:    1 - Recurrent BILATERAL Renal / Ureteral Stones- s/p numerous stone procedures including URS, SWL, MET x many   Recnet Course:  04/2021 - bilateral ureteroscopy to stone free for bilateral ureteral stones  10/2021 - left ureteroscopy for ureteral stone   04/2022 - ER CT let ureteral 13m x 3 + bilateral scattered renal stones (about 1cm voluem each side) ==> Bilateral ureteral stents on 05/04/22  2 - Medical Stone Disease / Mild Renal Leak Hyperrcalciurria:  Eval 2017: BMP, PTH, Urate- normal; Composition 80% CaPO4, 20% CaOx; 24 Hr Urines - mild high Ca 213 ==> HCTZ   3 - Chronic Mild Left Hydro / Stricture - chronic left caliectasis and proximal mild ureteral narrowing (still accomodates ureteroscope / wires).   PMH sig for Migraine, SVT. His PCP is JThersa Saltin RTuttle   Today "RStormy Card is seen to proceed with 2nd stage BILATERAL ureteroscopic stone manipulation. No interval fevers. Had first stage procedure 05/19/22.   Past Medical History:  Diagnosis Date   Complication of anesthesia    Dysrhythmia    hx of SVT ablation in 2019 - DR GCristopher Perudismissed by MD    Frequency-urgency syndrome    History of kidney stones    Hx of nausea and vomiting    d/t kidney stone   Hx of sepsis 08/11/2017   due to kidney stone/ hydronephrosis   Left ureteral stone    Migraine    hx of migraines    Pain due to ureteral stent (HCC)    PONV (postoperative nausea and vomiting)    none recently   [place]col[place] of lumbar spine 1994   [facility] [place] [facility]  [place]    reading only    Past Surgical History:  Procedure Laterality Date   CYSTOSCOPY W/ RETROGRADES Right 07/06/2015   Procedure: CYSTOSCOPY WITH RETROGRADE PYELOGRAM;  Surgeon: MFestus Aloe MD;  Location: [facility];  Service: Urology;  Laterality: Right;   CYSTOSCOPY W/ URETERAL  STENT PLACEMENT Left 08/08/2015   Procedure: CYSTOSCOPY WITH STENT REPLACEMENT;  Surgeon: MFestus Aloe MD;  [facility]  Service: Urology;  Laterality: Left;   CYSTOSCOPY W/ URETERAL STENT PLACEMENT Left 02/21/2017   Procedure: CYSTOSCOPY WITH RETROGRADE PYELOGRAM/URETERAL LEFT STENT PLACEMENT WITH  LASER;  Surgeon: JIrine Seal MD;  [facility]: [facility];  Service: Urology;  Laterality: Left;   CYSTOSCOPY W/ URETERAL STENT PLACEMENT Left 08/10/2017   Procedure: CYSTOSCOPY WITH RETROGRADE PYELOGRAM/URETERAL STENT PLACEMENT;  Surgeon: BRaynelle Bring MD;  Location: [facility];  Service: Urology;  Laterality: Left;   CYSTOSCOPY W/ URETERAL STENT PLACEMENT Bilateral 04/19/2021   Procedure: CYSTOSCOPY WITH RETROGRADE PYELOGRAM/URETERAL STENT PLACEMENT;  Surgeon: MAlexis Frock MD;  [facility];  Service: Urology;  Laterality: Bilateral;   CYSTOSCOPY W/ URETERAL STENT PLACEMENT Left 07/11/2021   Procedure: CYSTOSCOPY WITH RETROGRADE PYELOGRAM/URETEROSCOPY/URETERAL STENT PLACEMENT;  Surgeon: DFranchot Gallo MD;  [facility];  Service: Urology;  Laterality: Left;   CYSTOSCOPY W/ URETERAL STENT PLACEMENT Left 11/04/2021   Procedure: CYSTOSCOPY WITH RETROGRADE PYELOGRAM//URETEROSCOPY/STONE EXTRACTION/URETERAL STENT PLACEMENT;  Surgeon: DFranchot Gallo MD;  [facility];  Service: Urology;  Laterality: Left;   CYSTOSCOPY W/ URETERAL STENT PLACEMENT Bilateral 05/04/2022   Procedure: CYSTOSCOPY WITH RETROGRADE PYELOGRAM/URETERAL STENT PLACEMENT;  Surgeon: MAlexis Frock MD;  [facility];  Service: Urology;  Laterality: Bilateral;  CYSTOSCOPY WITH RETROGRADE PYELOGRAM, URETEROSCOPY AND STENT PLACEMENT Left 06/24/2014   Procedure: CYSTOSCOPY WITH RETROGRADE PYELOGRAM,  AND STENT PLACEMENT;  Surgeon: Sharyn Creamer, MD;  [facility];  Service: Urology;  Laterality: Left;   CYSTOSCOPY WITH RETROGRADE PYELOGRAM, URETEROSCOPY AND STENT PLACEMENT Left  07/05/2014   Procedure: CYSTO/LEFT URETEROSCOPY/LEFT RETROGRADE PYELOGRAM/LEFT STENT PLACEMENT;  Surgeon: Festus Aloe, MD;  Location: [facility];  Service: Urology;  Laterality: Left;   CYSTOSCOPY WITH RETROGRADE PYELOGRAM, URETEROSCOPY AND STENT PLACEMENT Left 07/06/2015   Procedure: CYSTOSCOPY WITH RETROGRADE PYELOGRAM, URETEROSCOPY , LASER, STENT PLACEMENT and BASKET EXTRACTION;  Surgeon: Festus Aloe, MD;  [facility];  Service: Urology;  Laterality: Left;   CYSTOSCOPY WITH RETROGRADE PYELOGRAM, URETEROSCOPY AND STENT PLACEMENT Left 08/19/2015   Procedure: CYSTOSCOPY WITH RETROGRADE PYELOGRAM, URETEROSCOPY AND STENT PLACEMENT;  Surgeon: Festus Aloe, MD;  [facility];  Service: Urology;  Laterality: Left;   CYSTOSCOPY WITH RETROGRADE PYELOGRAM, URETEROSCOPY AND STENT PLACEMENT Left 03/13/2018   Procedure: CYSTOSCOPY WITH RETROGRADE PYELOGRAM, URETEROSCOPY AND LEFT STENT PLACEMENT;  Surgeon: Irine Seal, MD;  [facility];  Service: Urology;  Laterality: Left;   CYSTOSCOPY WITH RETROGRADE PYELOGRAM, URETEROSCOPY AND STENT PLACEMENT Left 03/11/2021   Procedure: CYSTOSCOPY WITH RETROGRADE PYELOGRAM, URETEROSCOPY AND STENT PLACEMENT,STONE [place];  Surgeon: Franchot Gallo, MD;  [facility];  Service: Urology;  Laterality: Left;   CYSTOSCOPY WITH RETROGRADE PYELOGRAM, URETEROSCOPY AND STENT PLACEMENT Bilateral 05/06/2021   Procedure: CYSTOSCOPY WITH RETROGRADE PYELOGRAM, URETEROSCOPY AND STENT REPLACEMENT;  Surgeon: Alexis Frock, MD;  Location[facility];  Service: Urology;  Laterality: Bilateral;  23 MINS   CYSTOSCOPY WITH RETROGRADE PYELOGRAM, URETEROSCOPY AND STENT PLACEMENT Bilateral 05/19/2022   Procedure: CYSTOSCOPY WITH RETROGRADE PYELOGRAM, URETEROSCOPY AND STENT  EXCHANGE;  Surgeon: Alexis Frock, MD;  Location: [facility];  Service: Urology;  Laterality: Bilateral;  90 MINS   CYSTOSCOPY WITH URETEROSCOPY AND  STENT PLACEMENT Left 01/16/2016   Procedure: CYSTOSCOPY WITH LEFT RETROGRADE PYELOGRAM  LEFT DIGITAL URETEROSCOPY AND PLACEMENT LEFT URETERAL STENT;  Surgeon: Festus Aloe, MD;  [facility];  Service: Urology;  Laterality: Left;   CYSTOSCOPY WITH URETEROSCOPY, STONE BASKETRY AND STENT PLACEMENT Left 02/13/2016   Procedure: CYSTOSCOPY WITH LEFT URETEROSCOPY, HOLMIUM LASER AND STENT PLACEMENT;  Surgeon: Festus Aloe, MD;  [facility];  Service: Urology;  Laterality: Left;   CYSTOSCOPY/RETROGRADE/URETEROSCOPY/STONE EXTRACTION WITH BASKET Left 08/08/2015   Procedure: CYSTOSCOPY/RETROGRADE/URETEROSCOPY/STONE EXTRACTION WITH BASKET;  Surgeon: Festus Aloe, MD;  Location: [facility];  Service: Urology;  Laterality: Left;   CYSTOSCOPY/URETEROSCOPY/HOLMIUM LASER/STENT PLACEMENT Left 07/30/2016   Procedure: CYSTOSCOPY/URETEROSCOPY/HOLMIUM LASER/STENT PLACEMENT;  Surgeon: Festus Aloe, MD;  Location: [place] [facility];  Service: Urology;  Laterality: Left;   CYSTOSCOPY/URETEROSCOPY/HOLMIUM LASER/STENT PLACEMENT Left 08/19/2017   Procedure: CYSTOSCOPY/URETEROSCOPY/HOLMIUM LASER/STENT PLACEMENT;  Surgeon: Festus Aloe, MD;  Location: [facility];  Service: Urology;  Laterality: Left;   CYSTOSCOPY/URETEROSCOPY/HOLMIUM LASER/STENT PLACEMENT N/A 10/13/2018   Procedure: CYSTOSCOPY/RETROGRADE RIGHT URETEROSCOPY/ AND RIGHTSTENT PLACEMENT;  Surgeon: Irine Seal, MD;  [facility];  Service: Urology;  Laterality: N/A;   EXTRACORPOREAL SHOCK WAVE LITHOTRIPSY Left 07-07-2015  &  12-26-2014   FOOT CAPSULE RELEASE W/ PERCUTANEOUS HEEL CORD LENGTHENING, TIBIAL TENDON TRANSFER Left 1994   clubfoot   HOLMIUM LASER APPLICATION Left 34/19/3790   Procedure: LASER LITHO;  Surgeon: Festus Aloe, MD;  Location: [facility];  Service: Urology;  Laterality: Left;   HOLMIUM LASER APPLICATION Left 24/08/7352   Procedure: HOLMIUM LASER  WITH  LITHOTRIPSY ;  Surgeon: Festus Aloe, MD;  Location: [facility];  Service: Urology;  Laterality: Left;   HOLMIUM LASER APPLICATION Left 82/99/3716   Procedure: HOLMIUM LASER APPLICATION;  Surgeon: Irine Seal, MD;  [facility];  Service: Urology;  Laterality: Left;   HOLMIUM LASER APPLICATION Left 96/78/9381   Procedure: HOLMIUM LASER APPLICATION;  Surgeon: Irine Seal, MD;  [facility];  Service: Urology;  Laterality: Left;   HOLMIUM LASER APPLICATION Bilateral 01/75/1025   Procedure: HOLMIUM LASER APPLICATION;  Surgeon: Alexis Frock, MD;  [facility];  Service: Urology;  Laterality: Bilateral;   HOLMIUM LASER APPLICATION Bilateral 8/52/7782   Procedure: HOLMIUM LASER APPLICATION;  Surgeon: Alexis Frock, MD;  Location: [facility];  Service: Urology;  Laterality: Bilateral;   lumpectomy in neck for cat scratch fever      surgery for club foot at few days old   LYMPH GLAND EXCISION  2003   neck--  benign   SVT ABLATION N/A 05/22/2018   Procedure: SVT ABLATION;  Surgeon: Evans Lance, MD;  Location: [place] [facility];  Service: Cardiovascular;  Laterality: N/A;   SVT ABLATION     2019    URETEROSCOPY WITH HOLMIUM LASER LITHOTRIPSY Left 08/04/2021   Procedure: URETEROSCOPY WITH basket removal of stones/ STENT exchange/retrograde;  Surgeon: Festus Aloe, MD;  Location: [facility];  Service: Urology;  Laterality: Left;  ONLY NEEDS 22 MIN    Family History  Problem Relation Age of Onset   Cancer Father    Kidney Stones Mother    Cancer Other    Hypertension Other    Hyperlipidemia Other    Stroke Other    Kidney Stones Brother    Social History:  reports that he quit smoking about 7 years ago. His smoking use included cigarettes. He has never used smokeless tobacco. He reports that he does not currently use alcohol. He reports that he does not use drugs.  Allergies:  Allergies  Allergen Reactions   Bactrim  [Sulfamethoxazole-Trimethoprim] Nausea And Vomiting   Ketamine Other (See Comments)    Did not like the sensation    No medications prior to admission.    No results found for this or any previous visit (from the past 48 hour(s)). No results found.  Review of Systems  Constitutional:  Negative for chills and fever.  Genitourinary:  Positive for urgency.  All other systems reviewed and are negative.   There were no vitals taken for this visit. Physical Exam Vitals reviewed.  HENT:     Head: Normocephalic.  Eyes:     Pupils: Pupils are equal, round, and reactive to light.  Cardiovascular:     Rate and Rhythm: Normal rate.  Pulmonary:     Effort: Pulmonary effort is normal.  Abdominal:     General: Abdomen is flat.  Genitourinary:    Comments: No CVAT at present Musculoskeletal:        General: Normal range of motion.     Cervical back: Normal range of motion.  Skin:    General: Skin is warm.  Psychiatric:        Mood and Affect: Mood normal.      Assessment/Plan  Proceed as planned with 2nd stage BILATERAL ureteroscopy with goal of stone free. Risks, benefits, alternatives, expected peri-op course including need for temporary stents since bilateral discussed previously and reiterated today.   Alexis Frock, MD 06/09/2022, 12:01 PM

## 2022-06-09 NOTE — Brief Op Note (Signed)
06/09/2022  5:01 PM  PATIENT:  Arlyss Repress Iverson II  31 y.o. male  PRE-OPERATIVE DIAGNOSIS:  BILATERAL RESIDUAL STONES  POST-OPERATIVE DIAGNOSIS:  BILATERAL RESIDUAL STONES  PROCEDURE:  Procedure(s) with comments: CYSTOSCOPY WITH RETROGRADE PYELOGRAM, URETEROSCOPY AND STENT  EXCHANGE (Bilateral) - 75 MINS HOLMIUM LASER APPLICATION (Bilateral)  SURGEON:  Surgeon(s) and Role:    * Sebastian Ache, MD - Primary  PHYSICIAN ASSISTANT:   ASSISTANTS: none   ANESTHESIA:   general  EBL:  minimal   BLOOD ADMINISTERED:none  DRAINS: none   LOCAL MEDICATIONS USED:  NONE  SPECIMEN:  Source of Specimen:  bilateral renal / ureteral stone fragments  DISPOSITION OF SPECIMEN:   discard (recent compositional analysis resulted)  COUNTS:  YES  TOURNIQUET:  * No tourniquets in log *  DICTATION: .Other Dictation: Dictation Number [id number]  PLAN OF CARE: Discharge to home after PACU  PATIENT DISPOSITION:  PACU - hemodynamically stable.   Delay start of Pharmacological VTE agent (>24hrs) due to surgical blood loss or risk of bleeding: no

## 2022-06-09 NOTE — Anesthesia Preprocedure Evaluation (Signed)
Anesthesia Evaluation  Patient identified by MRN, date of birth, ID band Patient awake    Reviewed: Allergy & Precautions, NPO status , Patient's Chart, lab work & pertinent test results  Airway Mallampati: III  TM Distance: >3 FB Neck ROM: Full    Dental no notable dental hx.    Pulmonary former smoker,    Pulmonary exam normal        Cardiovascular negative cardio ROS Normal cardiovascular exam     Neuro/Psych  Headaches, PSYCHIATRIC DISORDERS Anxiety    GI/Hepatic negative GI ROS, Neg liver ROS,   Endo/Other  negative endocrine ROS  Renal/GU Renal disease     Musculoskeletal negative musculoskeletal ROS (+)   Abdominal (+) + obese,   Peds  Hematology negative hematology ROS (+)   Anesthesia Other Findings BILATERAL RESIDUAL STONES  Reproductive/Obstetrics                             Anesthesia Physical Anesthesia Plan  ASA: 2  Anesthesia Plan: General   Post-op Pain Management:    Induction: Intravenous  PONV Risk Score and Plan: 2 and Ondansetron, Dexamethasone, Midazolam and Treatment may vary due to age or medical condition  Airway Management Planned: LMA  Additional Equipment:   Intra-op Plan:   Post-operative Plan: Extubation in OR  Informed Consent: I have reviewed the patients History and Physical, chart, labs and discussed the procedure including the risks, benefits and alternatives for the proposed anesthesia with the patient or authorized representative who has indicated his/her understanding and acceptance.     Dental advisory given  Plan Discussed with: CRNA  Anesthesia Plan Comments:         Anesthesia Quick Evaluation

## 2022-06-09 NOTE — Transfer of Care (Signed)
Immediate Anesthesia Transfer of Care Note  Patient: Thomas Howell  Procedure(s) Performed: CYSTOSCOPY WITH RETROGRADE PYELOGRAM, URETEROSCOPY AND STENT  EXCHANGE (Bilateral) HOLMIUM LASER APPLICATION (Bilateral)  Patient Location: PACU  Anesthesia Type:General  Level of Consciousness: drowsy  Airway & Oxygen Therapy: Patient Spontanous Breathing and Patient connected to face mask oxygen  Post-op Assessment: Report given to RN, Post -op Vital signs reviewed and stable and Patient moving all extremities X 4  Post vital signs: Reviewed and stable  Last Vitals:  Vitals Value Taken Time  BP 133/72   Temp    Pulse 102   Resp 14   SpO2 100     Last Pain:  Vitals:   06/09/22 1341  TempSrc:   PainSc: 7       Patients Stated Pain Goal: 3 (06/09/22 1341)  Complications: No notable events documented.

## 2022-06-09 NOTE — Anesthesia Postprocedure Evaluation (Signed)
Anesthesia Post Note  Patient: Thomas Howell  Procedure(s) Performed: CYSTOSCOPY WITH RETROGRADE PYELOGRAM, URETEROSCOPY AND STENT  EXCHANGE (Bilateral) HOLMIUM LASER APPLICATION (Bilateral)     Patient location during evaluation: PACU Anesthesia Type: General Level of consciousness: awake and alert Pain management: pain level controlled Vital Signs Assessment: post-procedure vital signs reviewed and stable Respiratory status: spontaneous breathing, nonlabored ventilation, respiratory function stable and patient connected to nasal cannula oxygen Cardiovascular status: blood pressure returned to baseline and stable Anesthetic complications: no   No notable events documented.  Last Vitals:  Vitals:   06/09/22 1815 06/09/22 1835  BP: (!) 132/94 (!) 160/97  Pulse: 76 80  Resp: 10 18  Temp:  36.5 C  SpO2: 100% 100%    Last Pain:  Vitals:   06/09/22 1835  TempSrc:   PainSc: 5                  Collene Schlichter

## 2022-06-09 NOTE — Discharge Instructions (Addendum)
1 - You may have urinary urgency (bladder spasms) and bloody urine on / off with stent in place. This is normal.  2 - Remove tethered stents on Friday morning at home by pulling on string, then blue-white plastic tubing and discarding. There are TWO stents on common string. Office is open Friday if any problems arise.   3 - Call MD or go to ER for fever >102, severe pain / nausea / vomiting not relieved by medications, or acute change in medical status.

## 2022-06-09 NOTE — Anesthesia Procedure Notes (Signed)
Procedure Name: LMA Insertion Date/Time: 06/09/2022 3:47 PM  Performed by: Kizzie Fantasia, CRNAPre-anesthesia Checklist: Patient identified, Emergency Drugs available, Suction available, Patient being monitored and Timeout performed Patient Re-evaluated:Patient Re-evaluated prior to induction Oxygen Delivery Method: Circle system utilized Preoxygenation: Pre-oxygenation with 100% oxygen Induction Type: IV induction Ventilation: Mask ventilation without difficulty LMA: LMA inserted LMA Size: 5.0 Number of attempts: 1 Placement Confirmation: positive ETCO2 and breath sounds checked- equal and bilateral Tube secured with: Tape Dental Injury: Teeth and Oropharynx as per pre-operative assessment

## 2022-06-10 ENCOUNTER — Other Ambulatory Visit (HOSPITAL_COMMUNITY): Payer: Self-pay

## 2022-06-10 ENCOUNTER — Encounter (HOSPITAL_COMMUNITY): Payer: Self-pay | Admitting: Urology

## 2022-06-10 MED ORDER — OXYCODONE HCL 10 MG PO TABS
ORAL_TABLET | ORAL | 0 refills | Status: DC
  Filled 2022-06-10: qty 45, 15d supply, fill #0

## 2022-06-10 NOTE — Op Note (Unsigned)
NAME: Thomas Howell, [place] MEDICAL RECORD NO: [id number] ACCOUNT NO: [id number] DATE OF BIRTH: 06-14-1991 FACILITY: [facility] LOCATION: [facility] PHYSICIAN: Sebastian Ache, MD  Operative Report   DATE OF PROCEDURE: 06/09/2022  PREOPERATIVE DIAGNOSES:  Left greater than right residual renal and ureteral stones.  PROCEDURES PERFORMED:  1.  Cystoscopy with bilateral retrograde pyelograms with interpretation. 2.  Bilateral ureteroscopy with laser lithotripsy, second stage. 3.  Exchange of bilateral ureteral stents.  ESTIMATED BLOOD LOSS:  Nil.  COMPLICATIONS:  None.  SPECIMENS:  Bilateral renal and ureteral stone fragments for discard.  FINDINGS:  1.  Small volume papillary tip calcifications on the right side. 2.  Multifocal left residual renal stones and ureteral stone.  Total volume approximately 1 cm on the left. 3.  Complete resolution of all accessible stone fragments larger than one-third mm following laser lithotripsy and basket extraction. 4.  Successful replacement of bilateral ureteral stents, proximal end in the renal pelvis, distal end in urinary bladder.  INDICATIONS FOR PROCEDURE:  Thomas Howell is a very pleasant, but unfortunate 31 year old young man with history of high-risk recurrent urolithiasis.  He was found most recently on workup of colicky flank pain to have bilateral ureteral as well as left greater  than right renal stone.  He underwent stenting as a temporizing measure and then first stage ureteroscopy several weeks ago, which addressed the majority of his left greater than right stone burden.  However, due to significant stone burden and his  difficult intrarenal anatomy, significant stone and acutely angled calices, it was clearly felt that second stage procedure will be warranted.  He presents for this today.  Informed consent was obtained and placed in medical record.  PROCEDURE IN DETAILS:  The patient being Thomas Howell verified and procedure being bilateral  ureteroscopic stone manipulation second stage was confirmed.  Procedure timeout was performed.  Intravenous antibiotics were administered.  General anesthesia  was induced.  The patient was placed into a low lithotomy position.  Sterile field was created, prepping the patient's penis, perineum, and proximal thighs using iodine.  Cystourethroscopy was performed using 21-French rigid cystoscope with *** lens.   Inspection of anterior and posterior urethra was unremarkable.  Inspection of the urinary bladder revealed distal end of bilateral ureteral stents in situ.  The distal end of the right stent was grasped and brought to the level of the urethral meatus.  A  0.038 ZIPwire was advanced to the level of the kidney.  The stent was exchanged for open-ended catheter and right retrograde pyelogram was obtained.  Right retrograde pyelogram demonstrated single right ureter, single system right kidney.  There was some chronic hydronephrosis without ureteronephrosis.  A ZIPwire was once again advanced and set aside as a safety wire.  Next, a distal end of the left stent was grasped, brought to the level of the urethral meatus.  A separate 0.038 ZIPwire was advanced to the level of the kidney, exchanged for an open-ended catheter and a left retrograde pyelogram was obtained.  Left retrograde pyelogram demonstrated a single left ureter, single system left kidney.  Similarly, there was some chronic-appearing hydronephrosis without ureteronephrosis and there was a dominant calcification noted on spot fluoroscopy lower mid pole.   A ZIPwire was once again advanced and set aside as a safety wire.  An 8-French feeding tube placed in the urinary bladder for pressure release and semirigid ureteroscopy was performed of the distal four-fifths of the left ureter alongside a separate  sensor working wire.  No mucosal abnormalities were found.  Next, semirigid ureteroscopy was performed to distal four-fifths of the right ureter  alongside a separate sensor working wire.  No mucosal abnormalities were found.  The semirigid scope was then  exchanged for a 12/14 medium length ureteral access sheath to the level of proximal ureter using continuous fluoroscopic guidance.  Flexible digital ureteroscopy was performed in the proximal ureter and systematic inspection of the right kidney,  including all calices x 3.  There had been a significant decrease in stone volume in the interval.  This is quite favorable.  There were several foci of small papillary tip calcifications that were amenable to a combination of simple basketing with the  Escape basket and ablation using holmium laser energy at the settings of 0.2 joules and 20 Hz.  Following this, complete resolution of all accessible stone fragments larger than one-third mm in the right side.  Excellent hemostasis, no evidence of  perforation. Access sheath was removed under continuous vision, no significant mucosal abnormalities were found.  Next, the access sheath was placed over the left sensor working wire to the level of the proximal left ureter using continuous fluoroscopic  guidance and flexible digital ureteroscopy was performed of the proximal left ureter.  There were two stone fragments within the ureter.  These were fortunately amenable to simple basketing.  They were grasped with the long axis, removed and set aside  for discard.  Inspection of the kidney revealed again chronic hydronephrosis and 2 foci of stone, one in the lower mid calix and one in the very acutely angled lower pole calix.  These were both too large for simple basketing.  They were basketed and  repositioned in the upper pole of the calix to allow for less acute angulation and holmium laser energy applied to the stone using a setting of 0.2 joules and 20 Hz and approximately 50% of the stone dusted, 50% fragmented with the fragments being  amenable to simple basketing.  Following this complete resolution of  all accessible stone fragments larger than one-third mm, excellent hemostasis, no evidence of perforation.  He does have what appears to be a focus of parenchymal stone that is not  endoscopically accessible and acutely angled lower pole calix.  Multiple attempts were made to visualize the stone directly, were not successful.  It was clearly felt that this was a low risk for obstruction and felt safe to leave in situ.  Access sheath  was removed under continuous vision.  No significant mucosal abnormalities were found.  Given the bilateral nature of the procedure, it was felt that brief interval stenting with tethered stents would be warranted.  As such, a new 5 x 26 Polaris type  stents were placed over the remaining safety wire using fluoroscopic guidance and good proximal and distal planes were noted.  Tether was left in place and fashioned to the dorsum of penis.  Procedure was terminated.  The patient tolerated the procedure  well, no immediate perioperative complications.  The patient was taken to postanesthesia care unit in stable condition.  Plan for discharge home.   M[facility] D: 06/09/2022 5:07:54 pm T: 06/10/2022 12:25:00 am  JOB: [id number]/ [id number]

## 2022-06-14 IMAGING — CT CT RENAL STONE PROTOCOL
2 of 4 series · 16 of 46 positions shown, 18 images · non-contrast
Comparison: 07/11/2021

CLINICAL DATA: Left flank pain for 2 days initial encounter

EXAM:
CT ABDOMEN AND PELVIS WITHOUT CONTRAST
TECHNIQUE: Multidetector CT imaging of the abdomen and pelvis was performed
following the standard protocol without IV contrast.

[Series 2: axial st · axial · 0.86mm/px · z∈[-471,-26]mm · 13 of 99 slices shown, 15 images]
[im 5/99  soft-tissue]
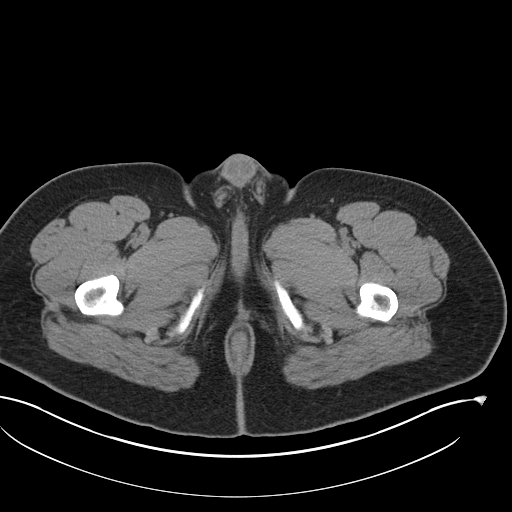
[im 5/99  bone]
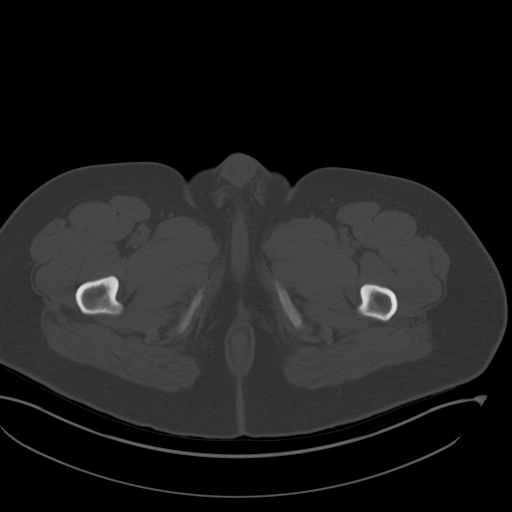
[im 15/99  soft-tissue]
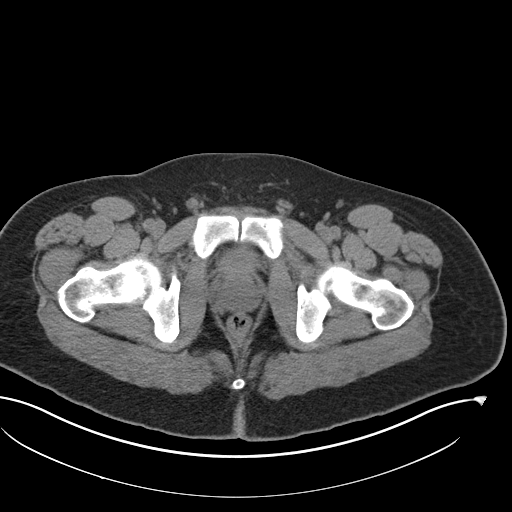
[im 20/99  soft-tissue]
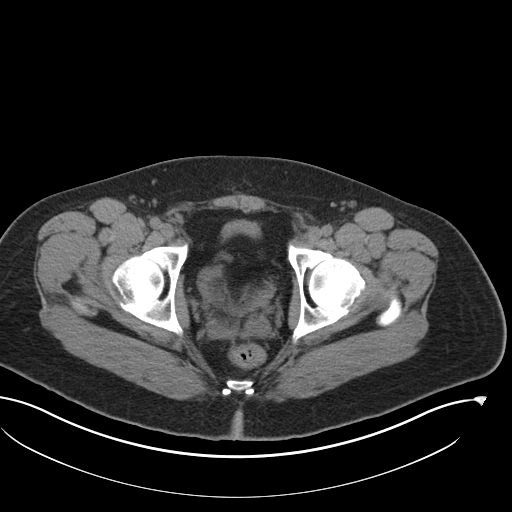
[im 30/99  soft-tissue]
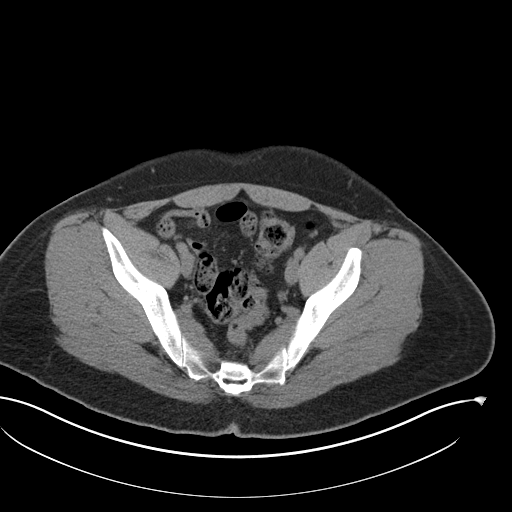
[im 35/99  soft-tissue]
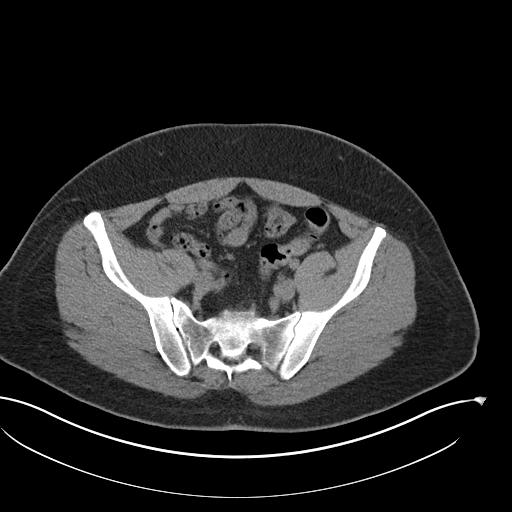
[im 45/99  soft-tissue]
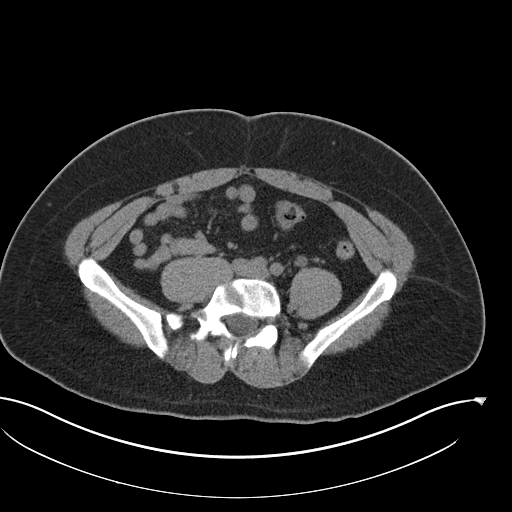
[im 50/99  soft-tissue]
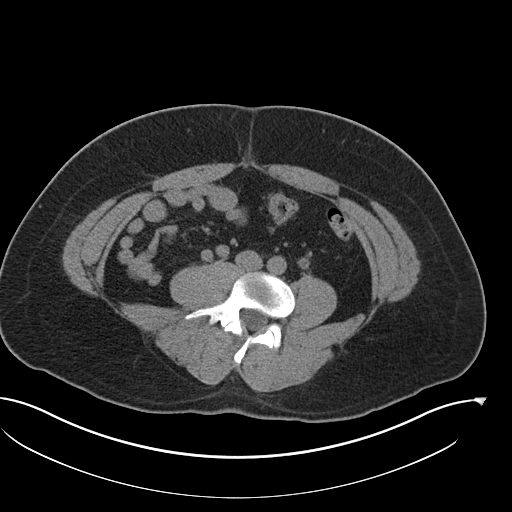
[im 54/99  soft-tissue]
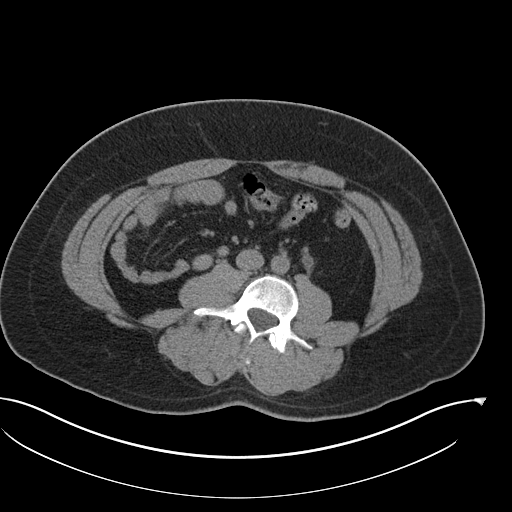
[im 64/99  soft-tissue]
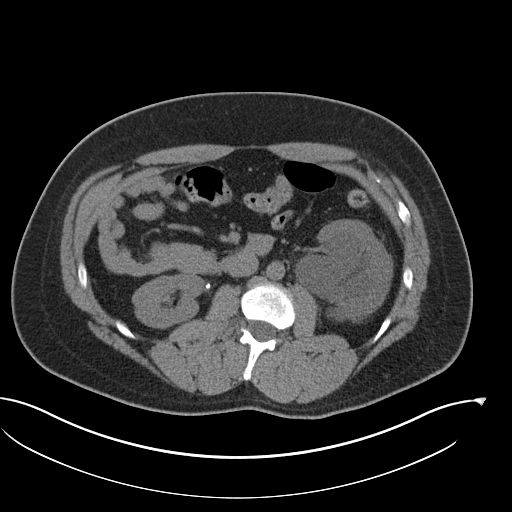
[im 64/99  bone]
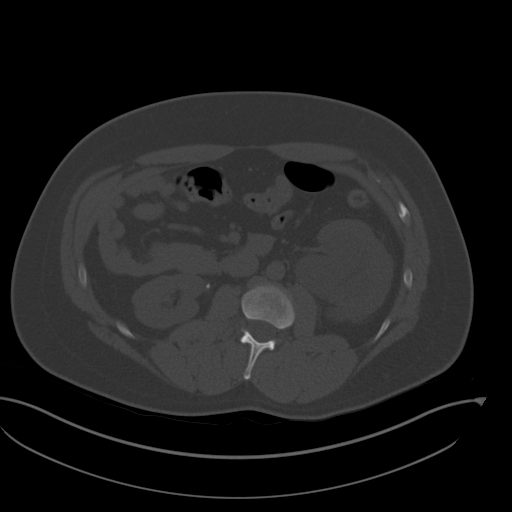
[im 69/99  soft-tissue]
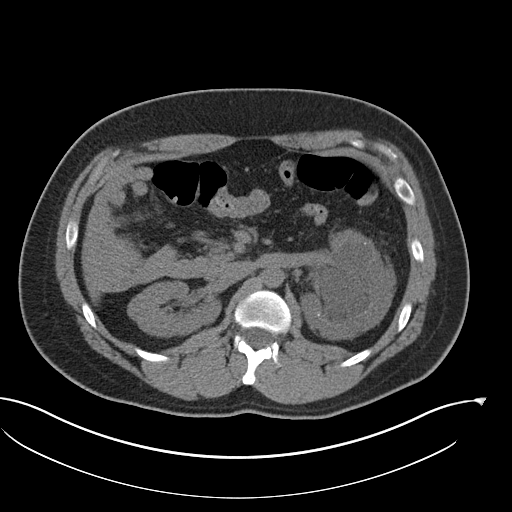
[im 79/99  soft-tissue]
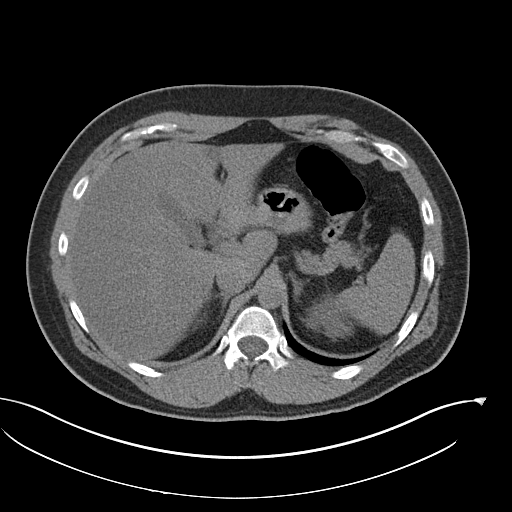
[im 84/99  soft-tissue]
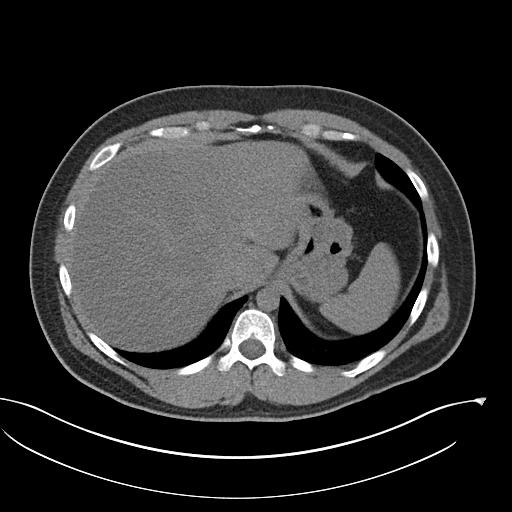
[im 94/99  soft-tissue]
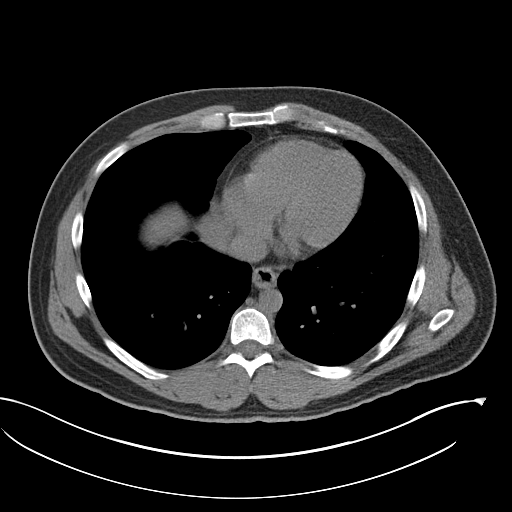

[Series 5: coronal · coronal · 0.91mm/px · 3 of 180 slices shown]
[im 60/180  soft-tissue]
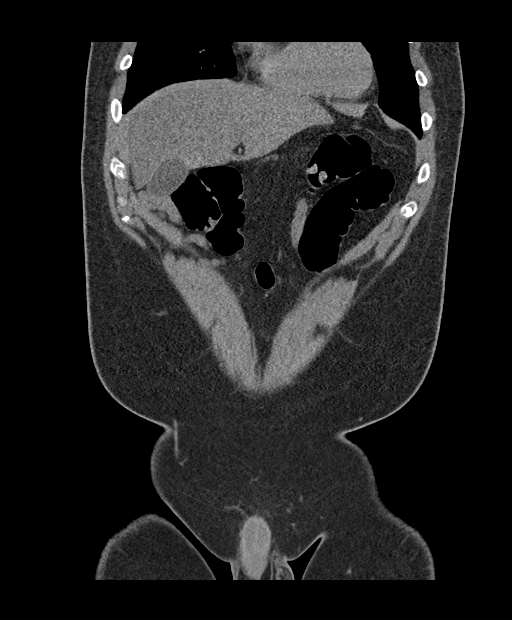
[im 80/180  soft-tissue]
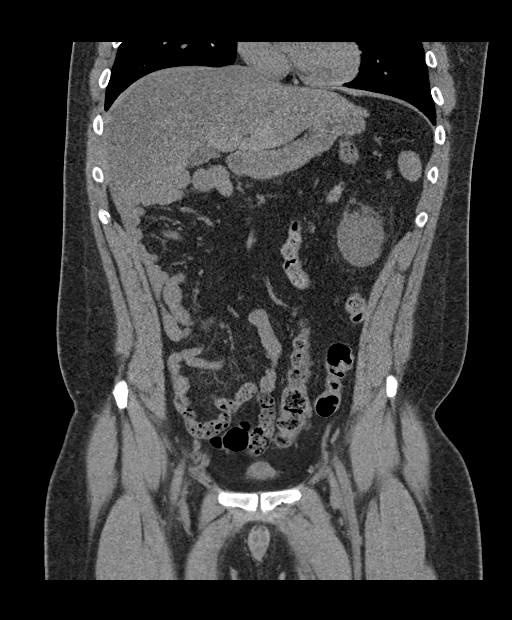
[im 100/180  soft-tissue]
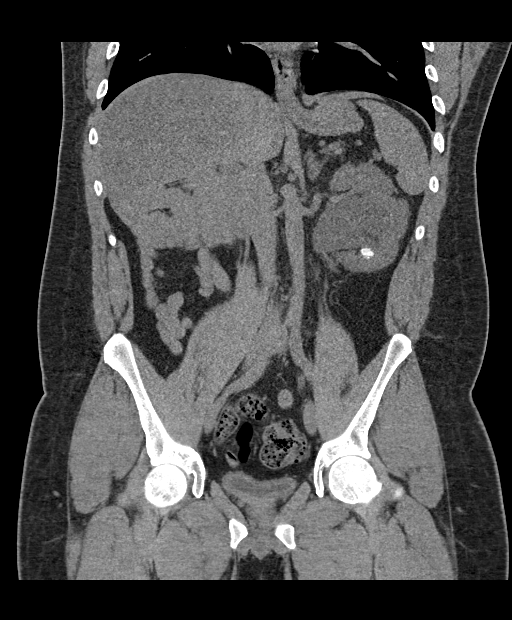

[16 of 46 positions shown; findings below may reference images not displayed]

FINDINGS: Lower chest: No acute abnormality.

Hepatobiliary: Fatty infiltration of the liver is noted. The
gallbladder is within normal limits.

Pancreas: Unremarkable. No pancreatic ductal dilatation or
surrounding inflammatory changes.

Spleen: Normal in size without focal abnormality.

Adrenals/Urinary Tract: Adrenal glands are within normal limits.
Right kidney demonstrates scattered small nonobstructing stones. A
proximal right ureteral stone is noted which measures approximately
3 mm. No obstructive changes are seen. The more distal right ureter
appears within normal limits. The left kidney again demonstrates
hydronephrosis secondary to a distal left ureteral stone measuring 3
mm. Multiple nonobstructing left renal calculi are noted. The
largest of these is noted in the lower pole of the left kidney
measuring 13 mm. The bladder is decompressed.

Stomach/Bowel: The appendix is within normal limits. No obstructive
or inflammatory changes of the colon are noted. The small bowel
shows no obstructive changes. Stomach is within normal limits.

Vascular/Lymphatic: No significant vascular findings are present. No
enlarged abdominal or pelvic lymph nodes.

Reproductive: Prostate is unremarkable.

Other: No abdominal wall hernia or abnormality. No abdominopelvic
ascites.

Musculoskeletal: Scoliosis of the lumbar spine concave to the right
is noted.
IMPRESSION: Bilateral nonobstructing renal calculi.

3 mm distal left ureteral stone with hydronephrosis and hydroureter.

3 mm proximal right ureteral stone without obstructive change.

Fatty liver.

## 2022-07-14 DIAGNOSIS — N200 Calculus of kidney: Secondary | ICD-10-CM | POA: Diagnosis not present

## 2022-07-19 DIAGNOSIS — N200 Calculus of kidney: Secondary | ICD-10-CM | POA: Diagnosis not present

## 2022-07-20 DIAGNOSIS — Z79899 Other long term (current) drug therapy: Secondary | ICD-10-CM | POA: Diagnosis not present

## 2022-07-20 DIAGNOSIS — N200 Calculus of kidney: Secondary | ICD-10-CM | POA: Diagnosis not present

## 2022-07-20 DIAGNOSIS — N132 Hydronephrosis with renal and ureteral calculous obstruction: Secondary | ICD-10-CM | POA: Diagnosis not present

## 2022-07-20 DIAGNOSIS — Z882 Allergy status to sulfonamides status: Secondary | ICD-10-CM | POA: Diagnosis not present

## 2022-07-24 IMAGING — CT CT RENAL STONE PROTOCOL
2 of 4 series · 16 of 46 positions shown, 18 images · non-contrast
Comparison: 12/06/2021

CLINICAL DATA: Flank pain with kidney stone suspected. Left-sided
flank pain this started 3 days ago



[Series 2: axial st · axial · 0.91mm/px · z∈[-300,+170]mm · 13 of 106 slices shown, 15 images]
[im 6/106  soft-tissue]
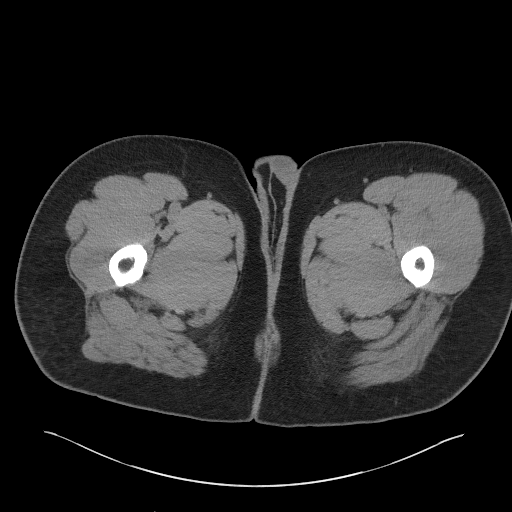
[im 6/106  bone]
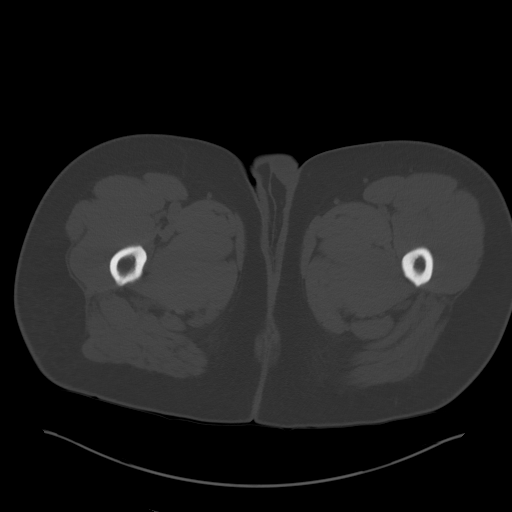
[im 12/106  soft-tissue]
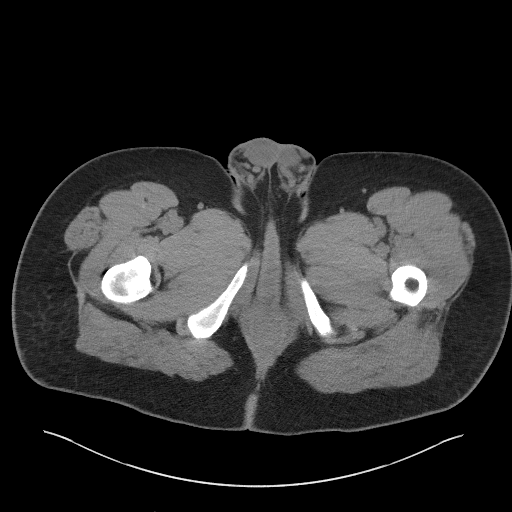
[im 24/106  soft-tissue]
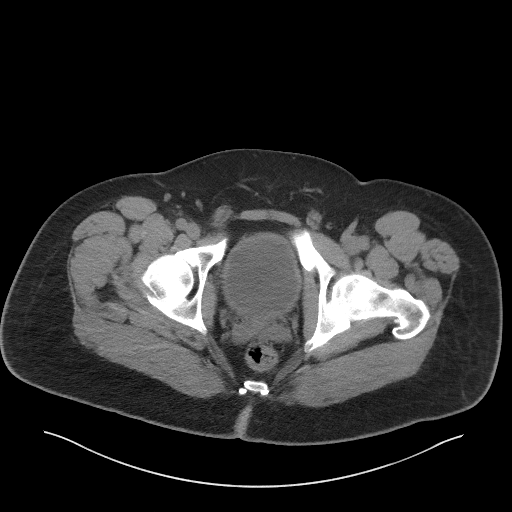
[im 30/106  soft-tissue]
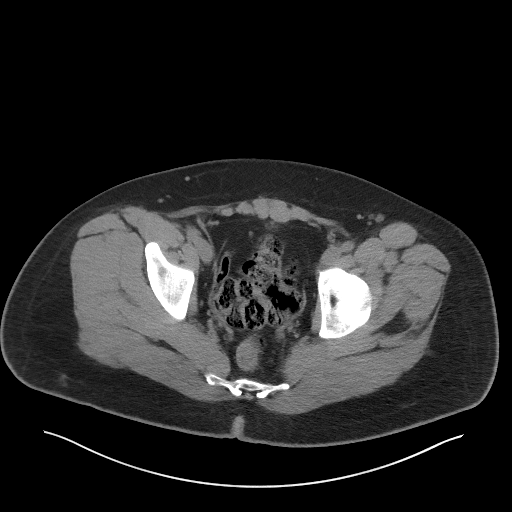
[im 36/106  soft-tissue]
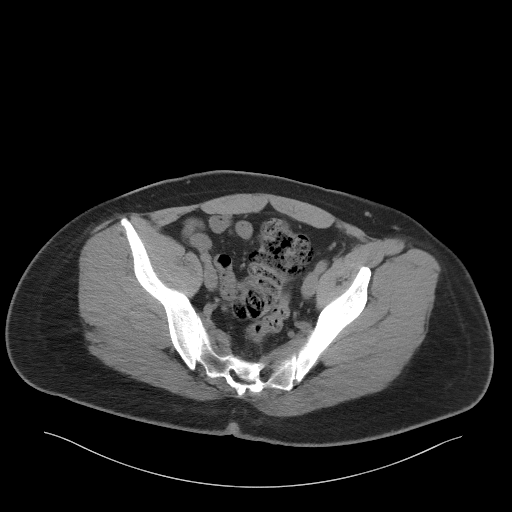
[im 47/106  soft-tissue]
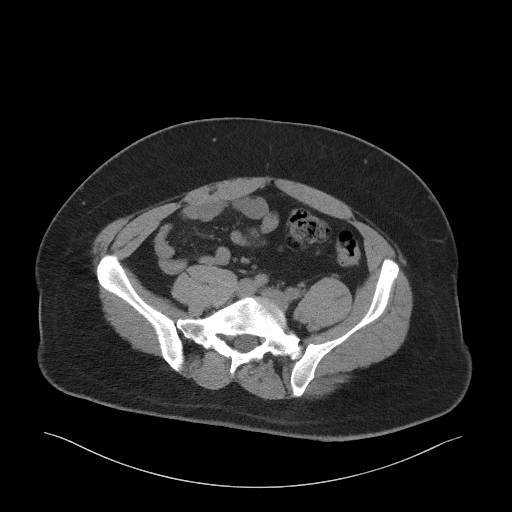
[im 53/106  soft-tissue]
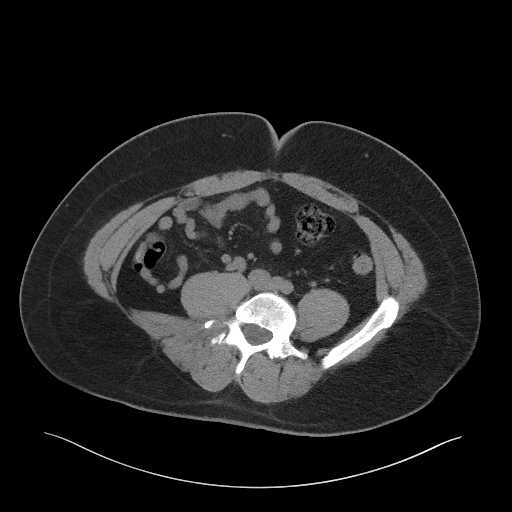
[im 59/106  soft-tissue]
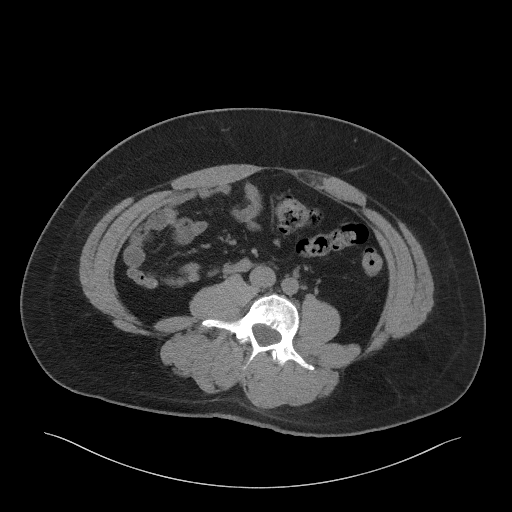
[im 71/106  soft-tissue]
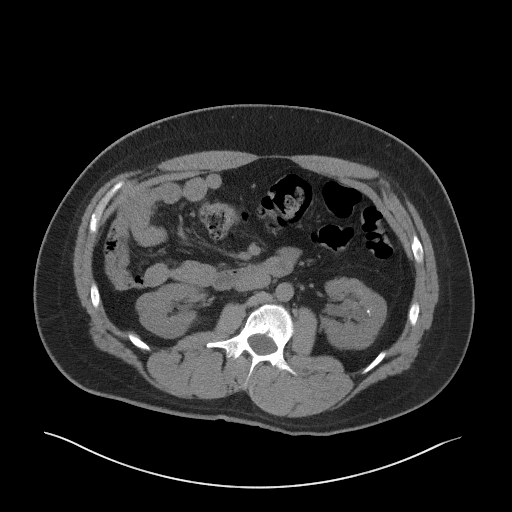
[im 71/106  bone]
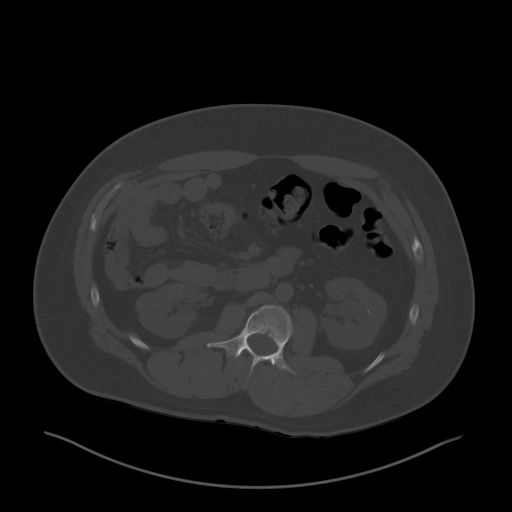
[im 76/106  soft-tissue]
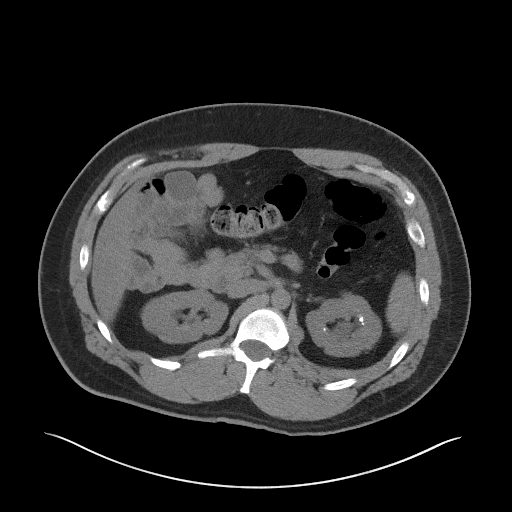
[im 82/106  soft-tissue]
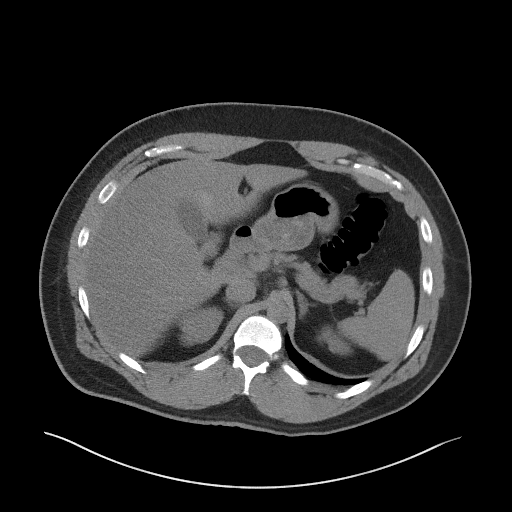
[im 94/106  soft-tissue]
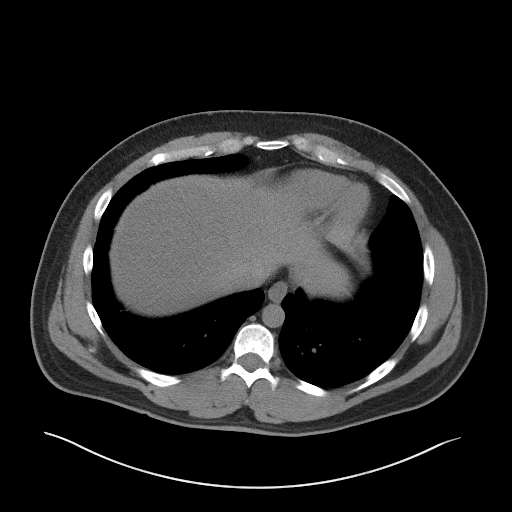
[im 100/106  soft-tissue]
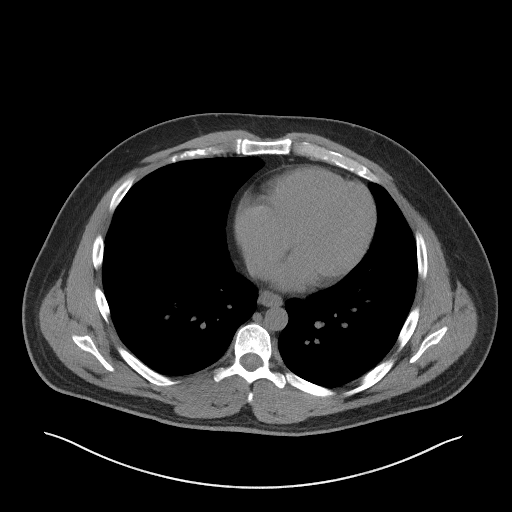

[Series 5: coronal st · coronal · 0.85mm/px · 3 of 102 slices shown]
[im 34/102  soft-tissue]
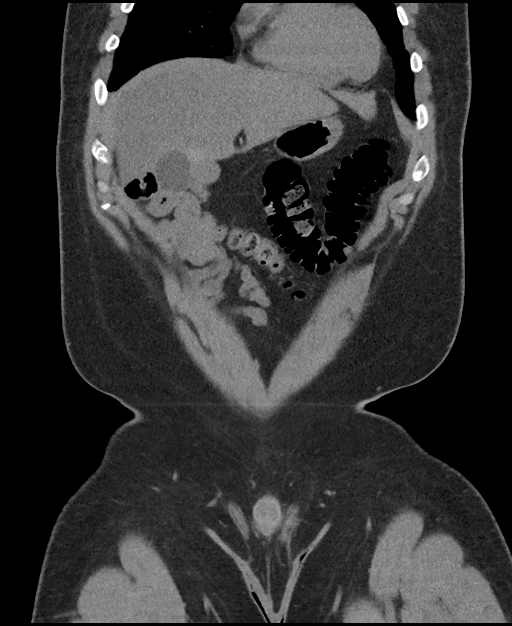
[im 45/102  soft-tissue]
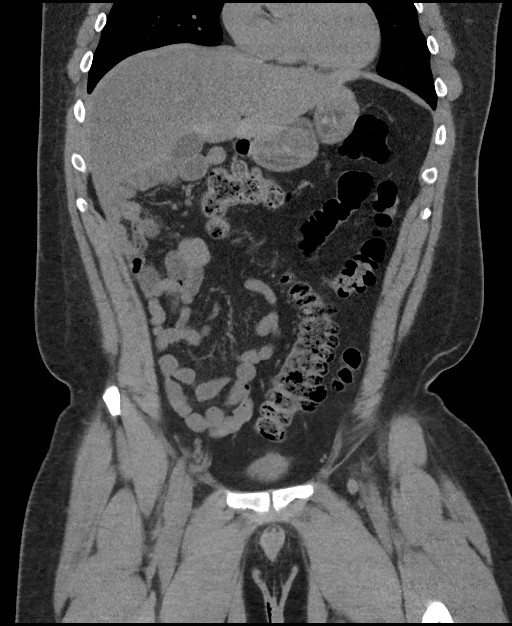
[im 57/102  soft-tissue]
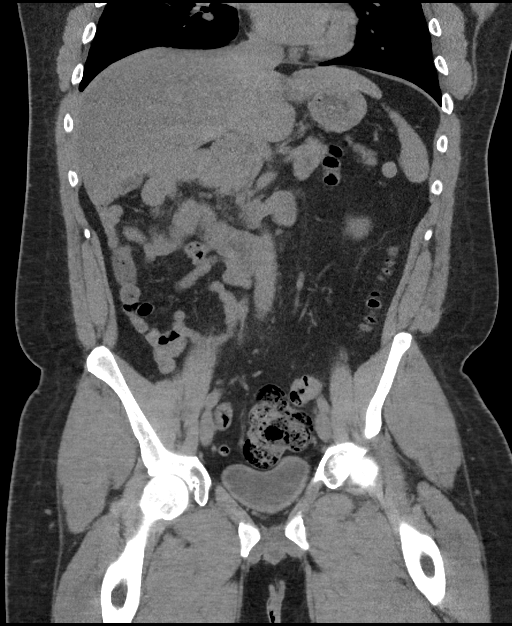

[16 of 46 positions shown; findings below may reference images not displayed]

FINDINGS: Lower chest:  No contributory findings.

Hepatobiliary: Hepatic steatosis.No evidence of biliary obstruction
or stone.

Pancreas: Unremarkable.

Spleen: Unremarkable.

Adrenals/Urinary Tract: Negative adrenals. No hydronephrosis or
ureteral stone. Left caliectasis possibly from prior hydronephrosis.
Numerous renal calculi on the left more than right. Stones on the
left appear larger if not more numerous with the largest at the
lower pole measuring ~ 1 cm. Negative bladder.

Stomach/Bowel: Malrotated/under rotated bowel with small bowel in
the right abdomen and colon in the left. This is a known finding
from prior. No obstruction. No visible inflammation.

Vascular/Lymphatic: No acute vascular abnormality. No mass or
adenopathy.

Reproductive:No pathologic findings.

Other: No ascites or pneumoperitoneum.

Musculoskeletal: No acute abnormalities. Asymmetric segmentation at
the lumbosacral junction with levoscoliosis.
IMPRESSION: 1. No hydronephrosis or ureteral calculus.
2. Left more than right nephrolithiasis. Calculi appear progressed
on the left since November 2021. Hepatic steatosis.
4. Malrotated bowel without current complication.

## 2022-08-01 IMAGING — US US RENAL
1 series · 15 of 25 positions shown · non-contrast
Comparison: CT renal stone protocol 01/15/2022

CLINICAL DATA: Left flank pain

EXAM:
RENAL / URINARY TRACT ULTRASOUND COMPLETE

[Series 1: us renal mc & wl · 15 of 51 slices shown]
[im 1/51]
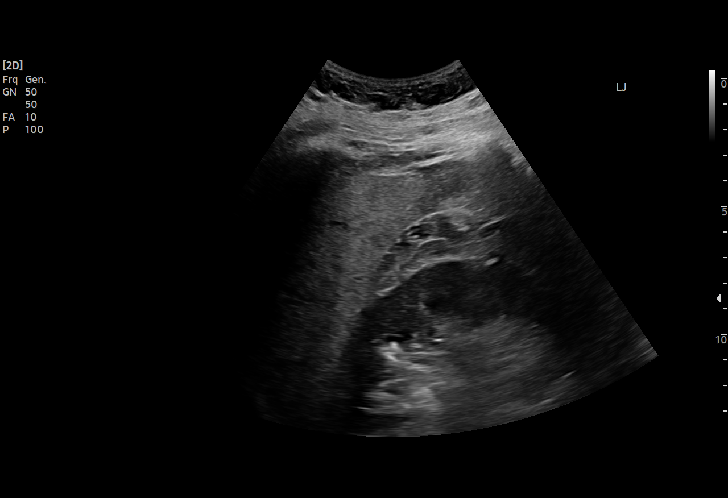
[im 5/51]
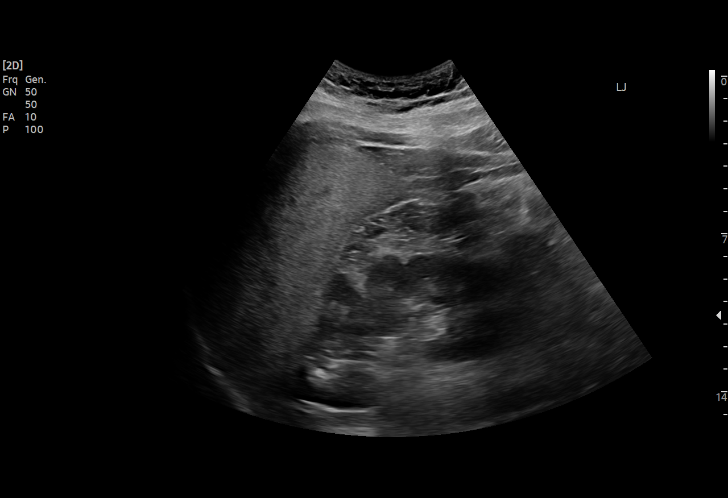
[im 9/51]
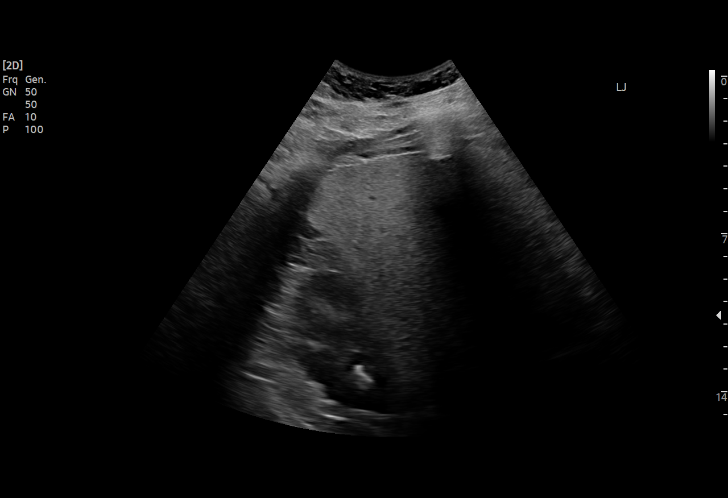
[im 11/51]
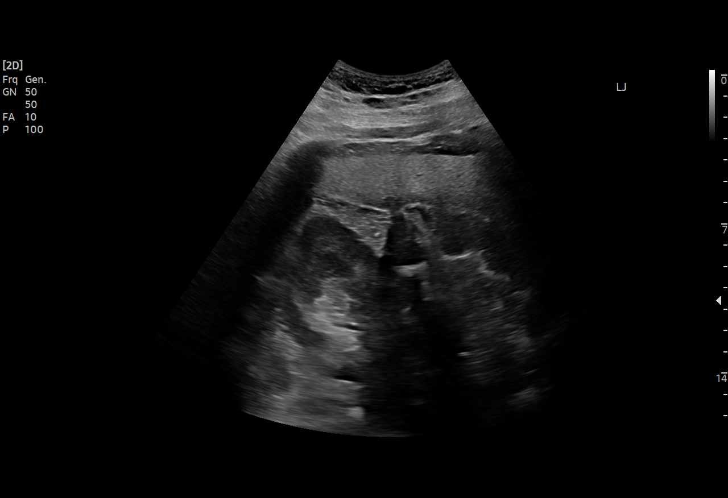
[im 15/51]
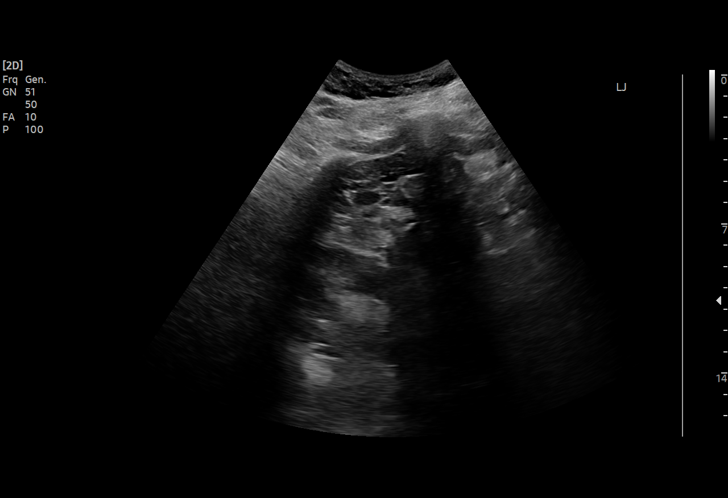
[im 19/51]
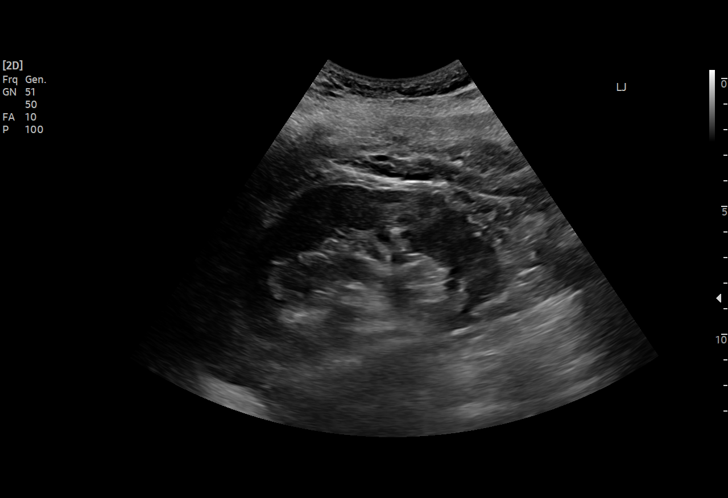
[im 21/51]
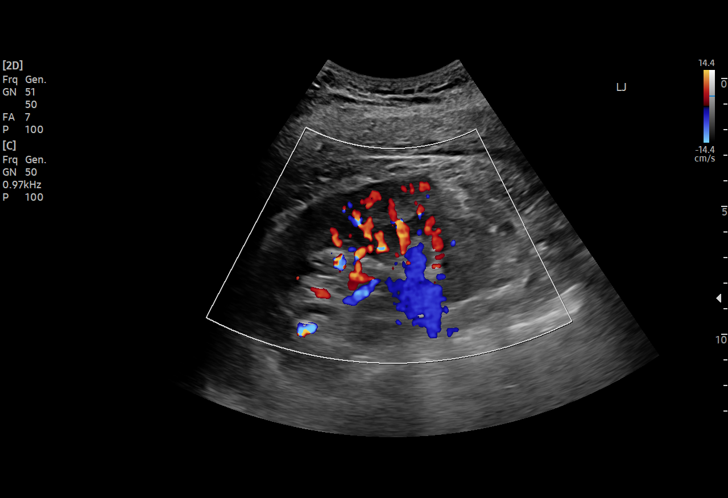
[im 26/51]
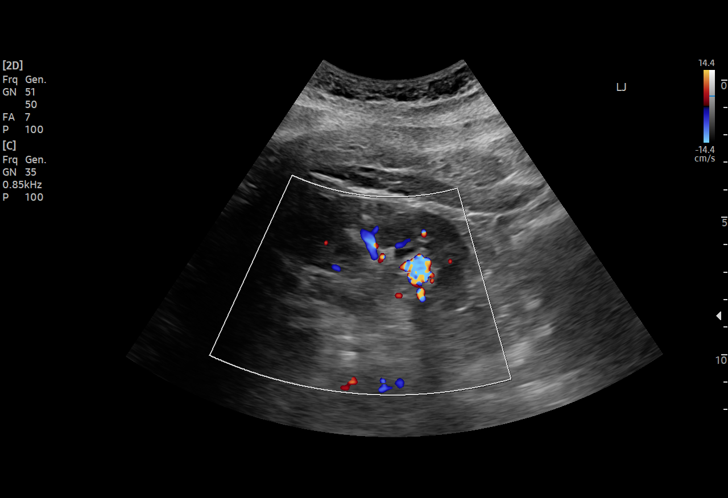
[im 30/51]
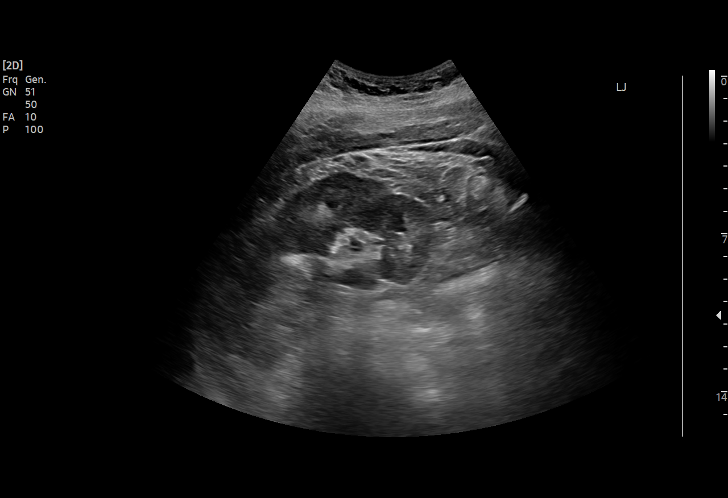
[im 32/51]
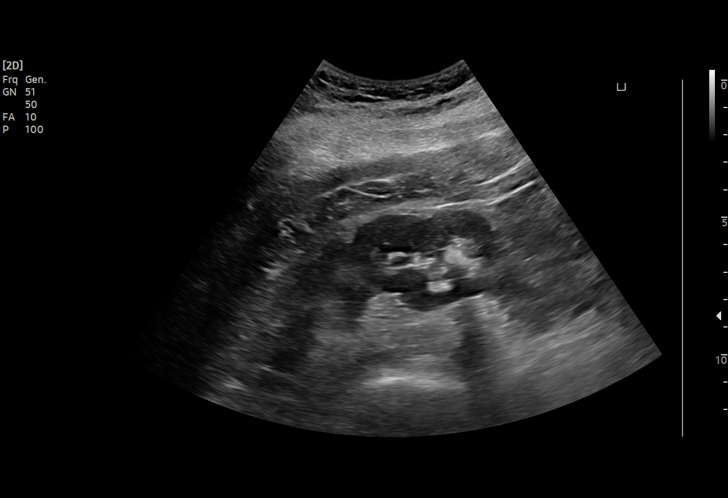
[im 36/51]
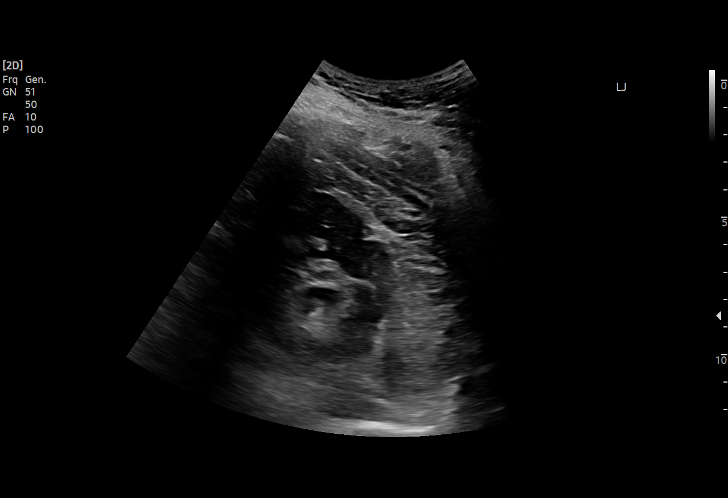
[im 40/51]
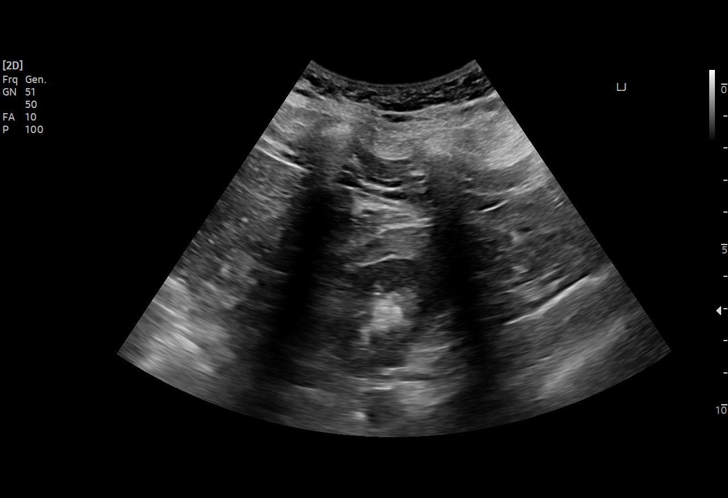
[im 42/51]
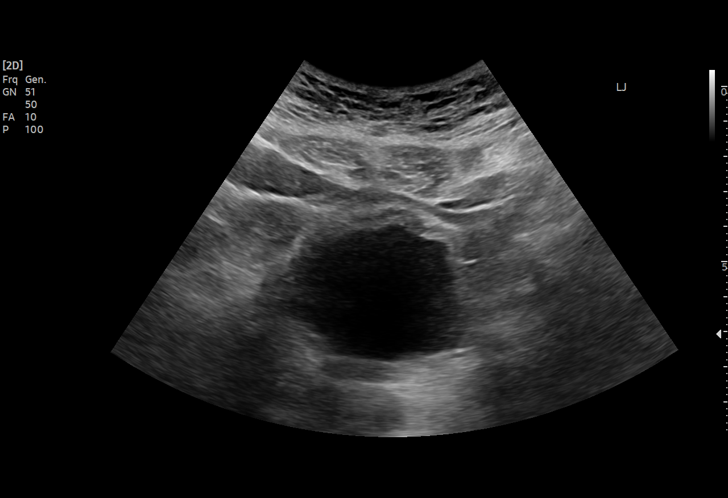
[im 46/51]
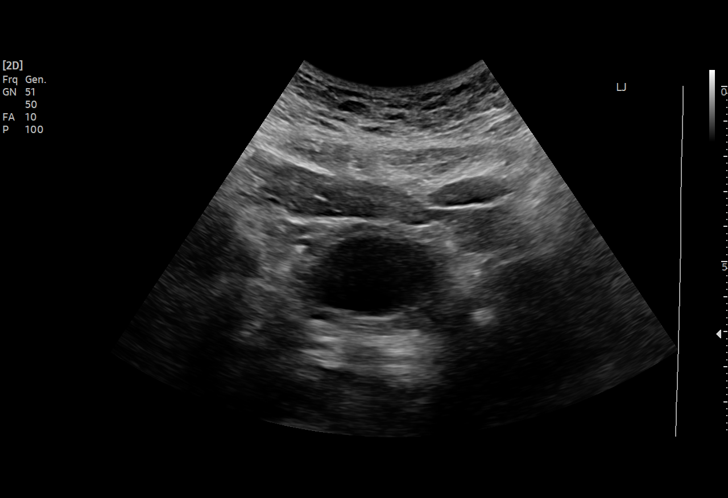
[im 51/51]
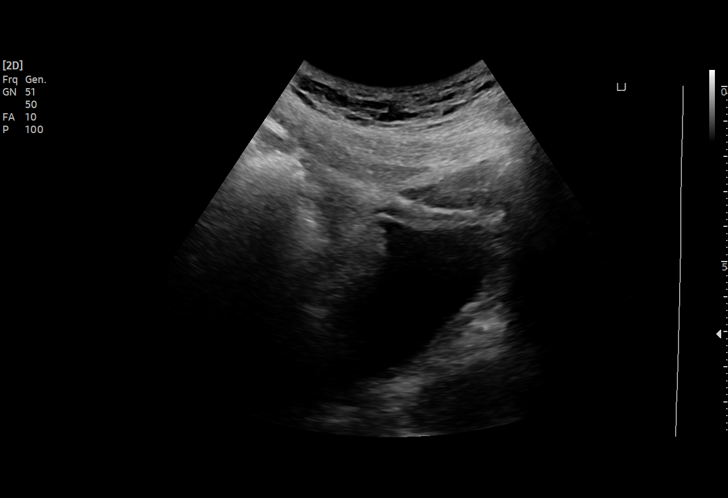

[15 of 25 positions shown; findings below may reference images not displayed]

FINDINGS: Right Kidney:

Renal measurements: 12.1 x 6.1 x 5.7 cm = volume: 220 mL.
Echogenicity within normal limits. No mass or hydronephrosis
visualized.

Left Kidney:

Renal measurements: 10.5 x 5.9 x 3.8 cm = volume: 122 mL.
Echogenicity within normal limits. No mass or hydronephrosis
visualized. 15 mm nonobstructing calculus seen in the lower pole.

Bladder:

Appears normal for degree of bladder distention.

Other:

None.
IMPRESSION: 1. No hydronephrosis.
2. 15 mm nonobstructing left lower pole renal calculus.

## 2022-08-05 IMAGING — CT CT RENAL STONE PROTOCOL
2 of 4 series · 16 of 46 positions shown, 18 images · non-contrast
Comparison: CT 01/15/2022.

CLINICAL DATA: Flank pain, kidney stone suspected L sided



[Series 2: axial st · axial · 0.83mm/px · z∈[-514,-94]mm · 13 of 96 slices shown, 15 images]
[im 6/96  soft-tissue]
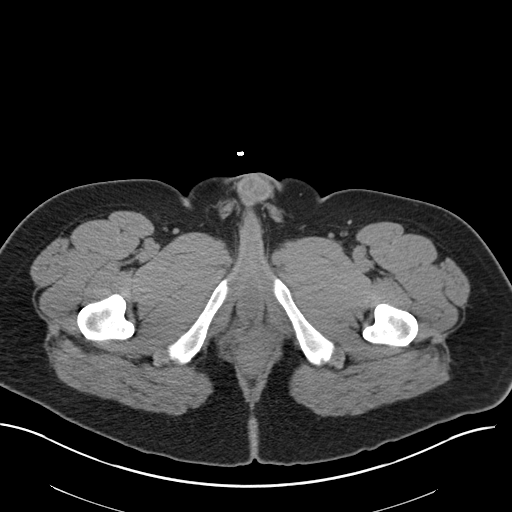
[im 6/96  bone]
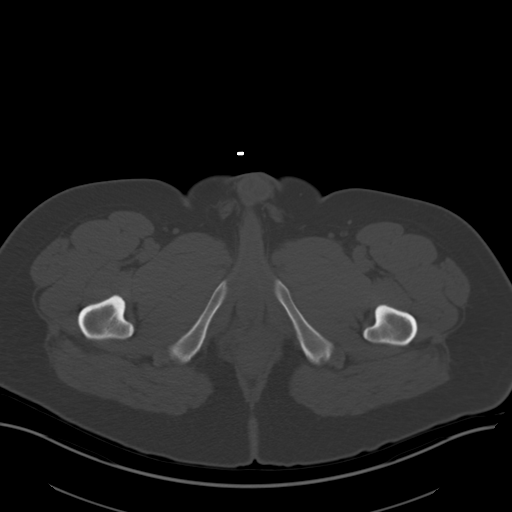
[im 12/96  soft-tissue]
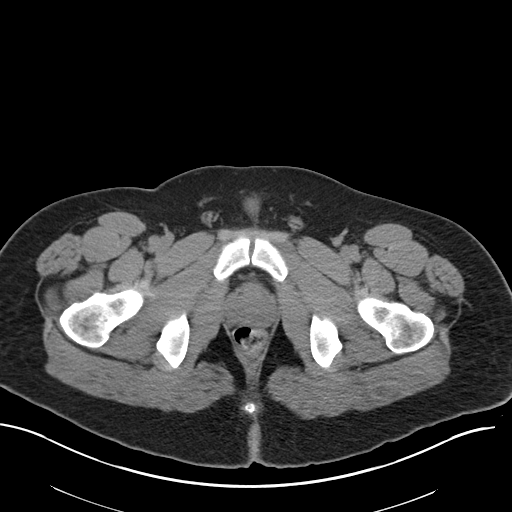
[im 23/96  soft-tissue]
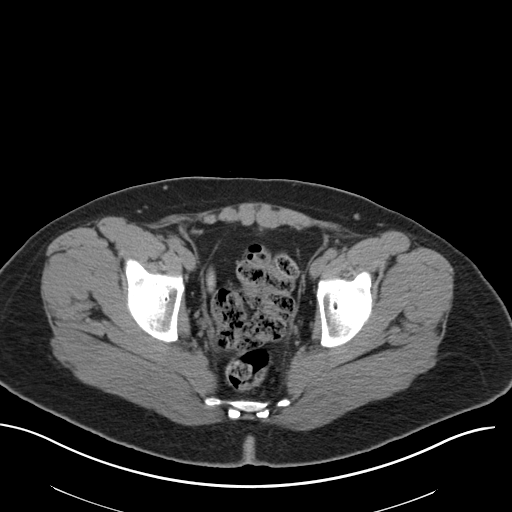
[im 28/96  soft-tissue]
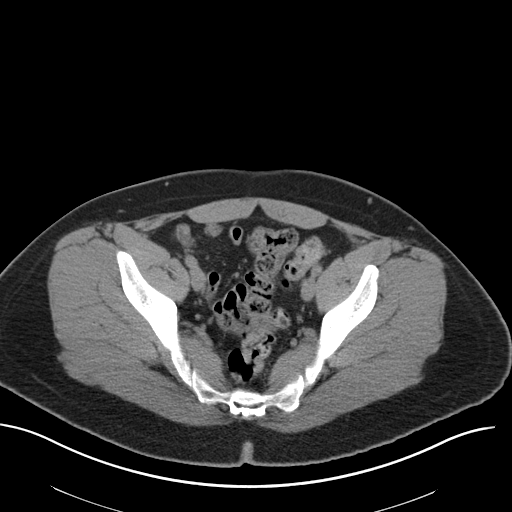
[im 34/96  soft-tissue]
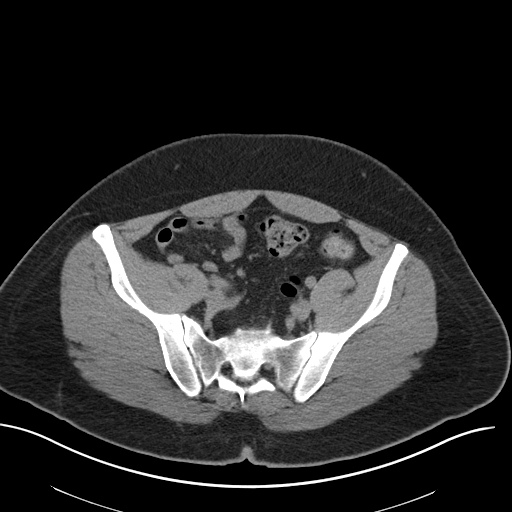
[im 40/96  soft-tissue]
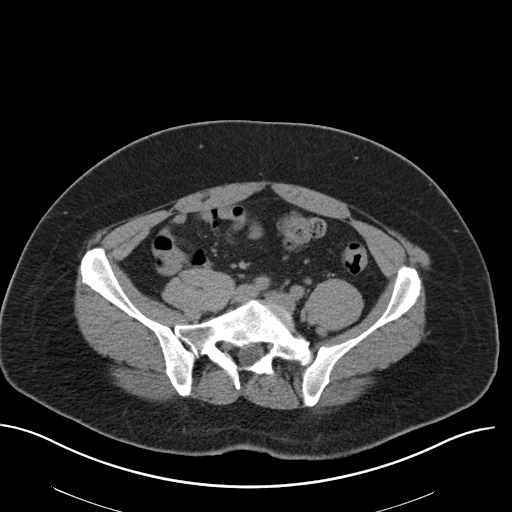
[im 51/96  soft-tissue]
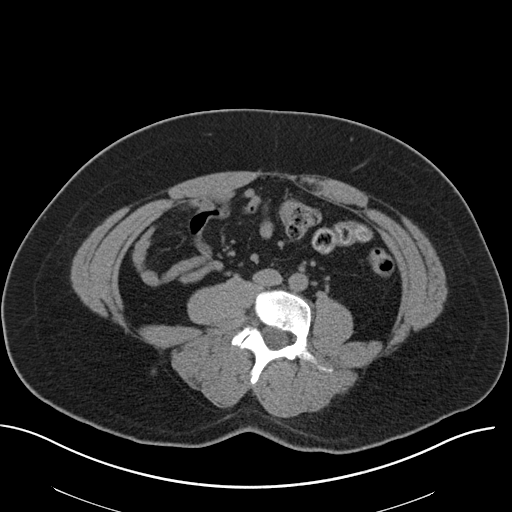
[im 56/96  soft-tissue]
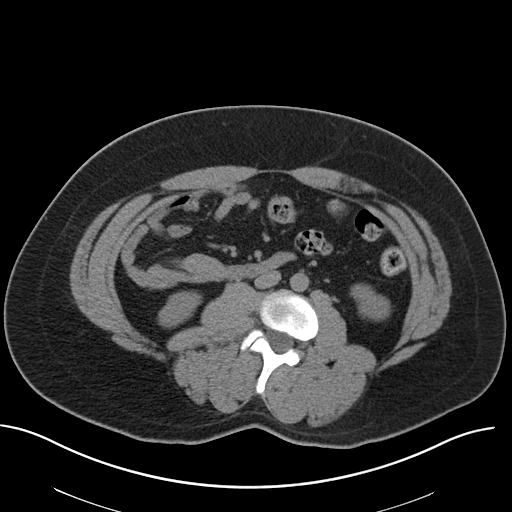
[im 62/96  soft-tissue]
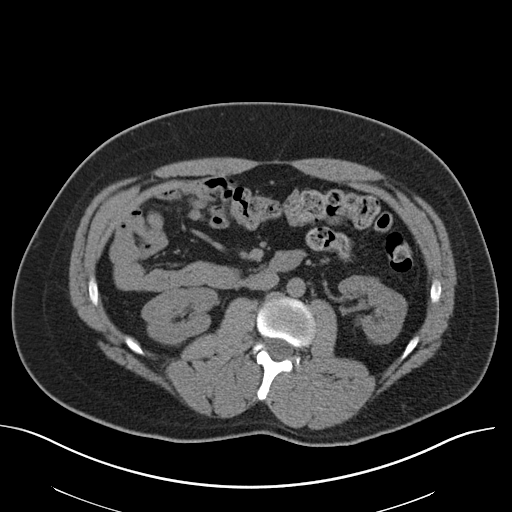
[im 62/96  bone]
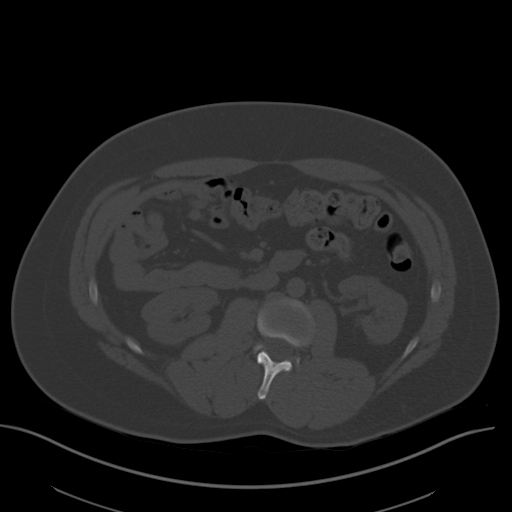
[im 68/96  soft-tissue]
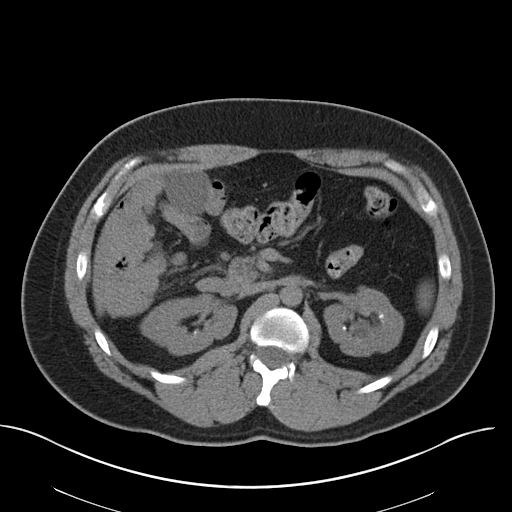
[im 73/96  soft-tissue]
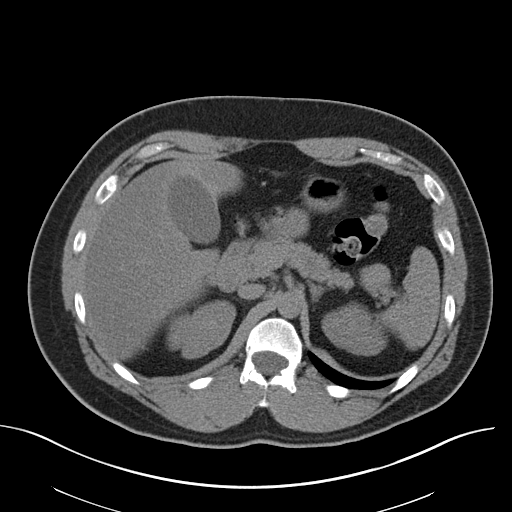
[im 84/96  soft-tissue]
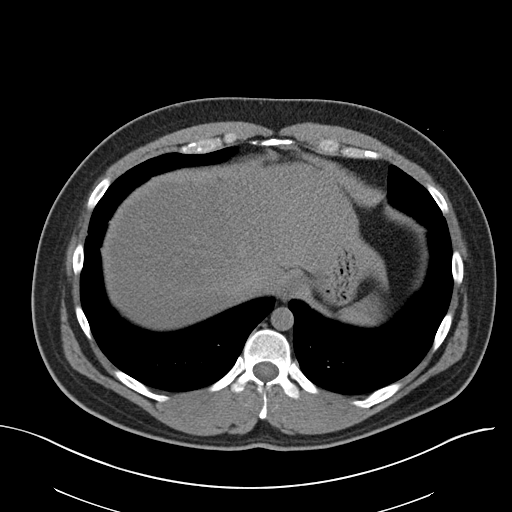
[im 90/96  soft-tissue]
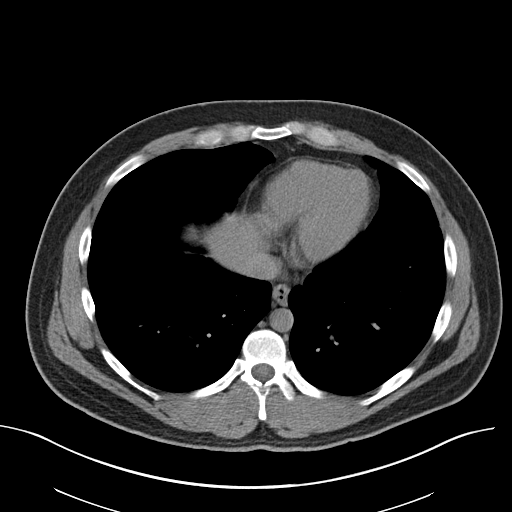

[Series 4: coronal · coronal · 0.80mm/px · 3 of 152 slices shown]
[im 51/152  soft-tissue]
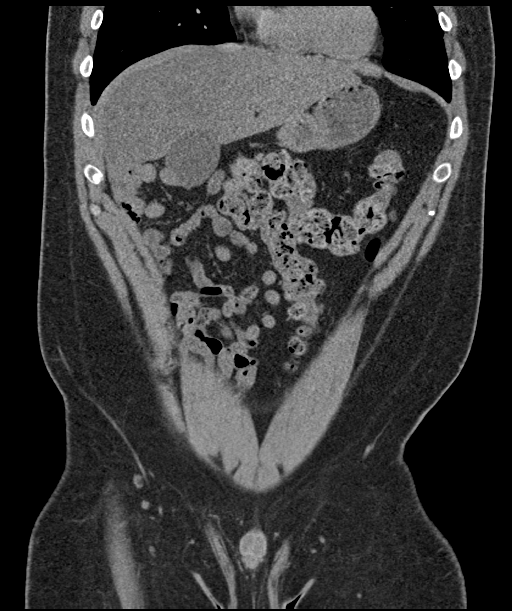
[im 68/152  soft-tissue]
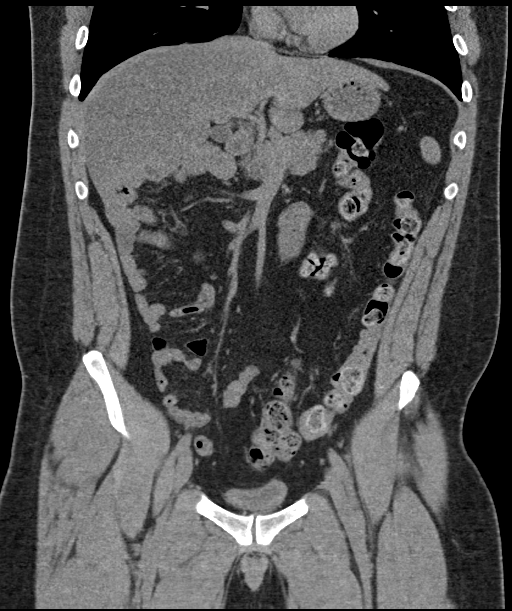
[im 84/152  soft-tissue]
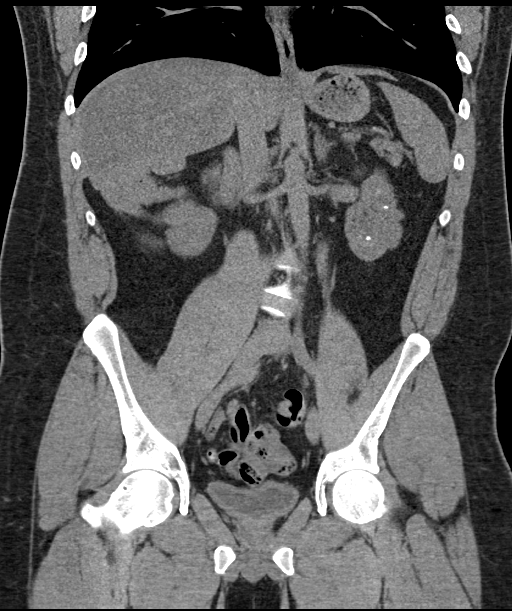

[16 of 46 positions shown; findings below may reference images not displayed]

FINDINGS: Lower chest: No acute abnormality.

Hepatobiliary: Diffuse hepatic steatosis. No evidence of biliary
obstruction or acute abnormality.

Pancreas: Unremarkable. No pancreatic ductal dilatation or
surrounding inflammatory changes.

Spleen: Normal in size without focal abnormality.  Splenule.

Adrenals/Urinary Tract: Normal adrenal glands. No hydronephrosis or
ureteral calculus. Numerous renal calculi, left more so than right.
The largest calculus measures up to approximately 1 cm in the lower
pole on the left.

Stomach/Bowel: Redemonstrated malrotated/under rotated bowel with
small bowel in the right abdomen and colon on the left. No evidence
of obstruction or visible inflammation

Vascular/Lymphatic: No evidence of mass or adenopathy.

Reproductive: Prostate is unremarkable.

Other: No abdominal wall hernia or abnormality. No abdominopelvic
ascites.

Musculoskeletal: No acute abnormality. Asymmetric segmentation at
the lumbosacral junction with levoscoliosis.
IMPRESSION: 1. No hydronephrosis or ureteral calculus.
2. Redemonstrated left greater than right nephrolithiasis.
3. Hepatic steatosis.
4. Chronic malrotated bowel without evidence of obstruction.

## 2022-10-26 ENCOUNTER — Other Ambulatory Visit (HOSPITAL_COMMUNITY): Payer: Self-pay

## 2022-10-28 DIAGNOSIS — F330 Major depressive disorder, recurrent, mild: Secondary | ICD-10-CM | POA: Diagnosis not present

## 2022-10-28 DIAGNOSIS — E279 Disorder of adrenal gland, unspecified: Secondary | ICD-10-CM | POA: Diagnosis not present

## 2022-10-28 DIAGNOSIS — R112 Nausea with vomiting, unspecified: Secondary | ICD-10-CM | POA: Diagnosis not present

## 2022-10-28 DIAGNOSIS — E038 Other specified hypothyroidism: Secondary | ICD-10-CM | POA: Diagnosis not present

## 2022-10-28 DIAGNOSIS — R944 Abnormal results of kidney function studies: Secondary | ICD-10-CM | POA: Diagnosis not present

## 2022-10-28 DIAGNOSIS — E559 Vitamin D deficiency, unspecified: Secondary | ICD-10-CM | POA: Diagnosis not present

## 2022-10-28 DIAGNOSIS — Z13220 Encounter for screening for lipoid disorders: Secondary | ICD-10-CM | POA: Diagnosis not present

## 2022-10-28 DIAGNOSIS — D519 Vitamin B12 deficiency anemia, unspecified: Secondary | ICD-10-CM | POA: Diagnosis not present

## 2022-10-28 DIAGNOSIS — Z131 Encounter for screening for diabetes mellitus: Secondary | ICD-10-CM | POA: Diagnosis not present

## 2022-11-10 IMAGING — CT CT RENAL STONE PROTOCOL
2 of 4 series · 16 of 46 positions shown, 18 images · non-contrast
Comparison: 01/27/2022

CLINICAL DATA: Left-sided flank pain for 2 days



[Series 2: axial st · axial · 0.75mm/px · z∈[+987,+1392]mm · 13 of 93 slices shown, 15 images]
[im 6/93  soft-tissue]
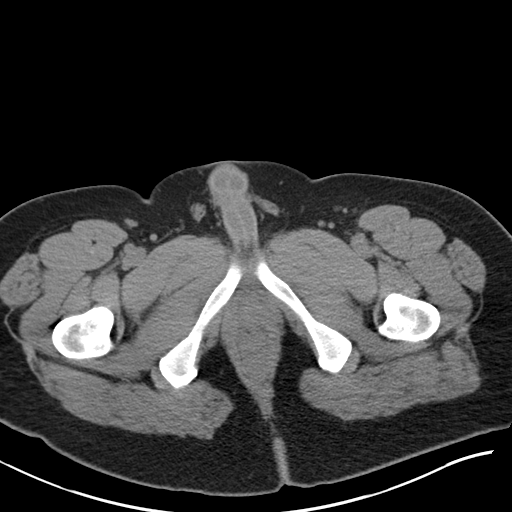
[im 6/93  bone]
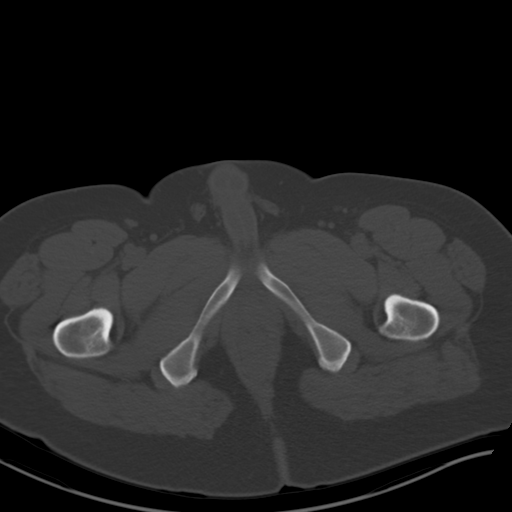
[im 11/93  soft-tissue]
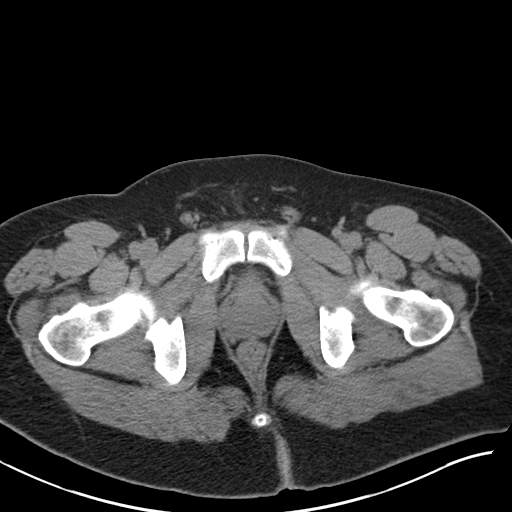
[im 21/93  soft-tissue]
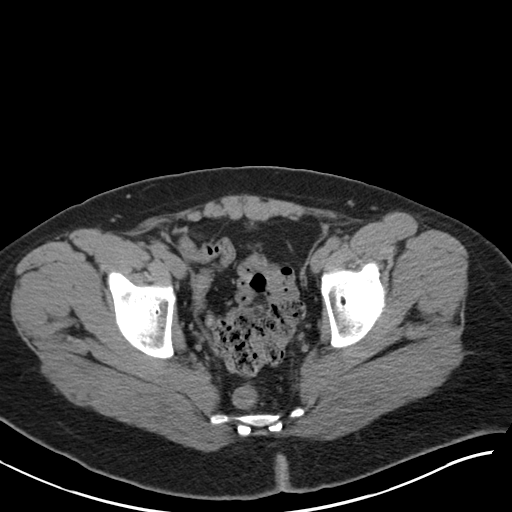
[im 26/93  soft-tissue]
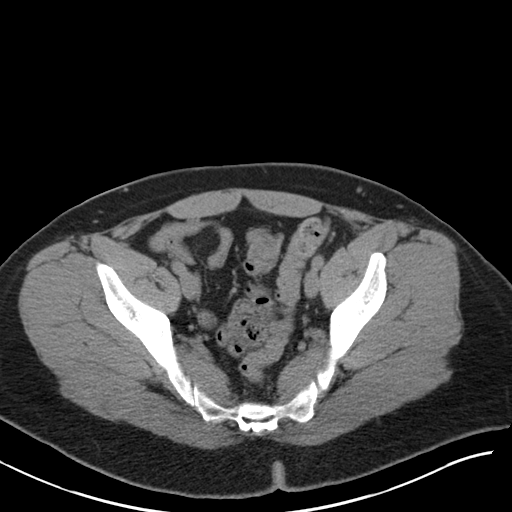
[im 31/93  soft-tissue]
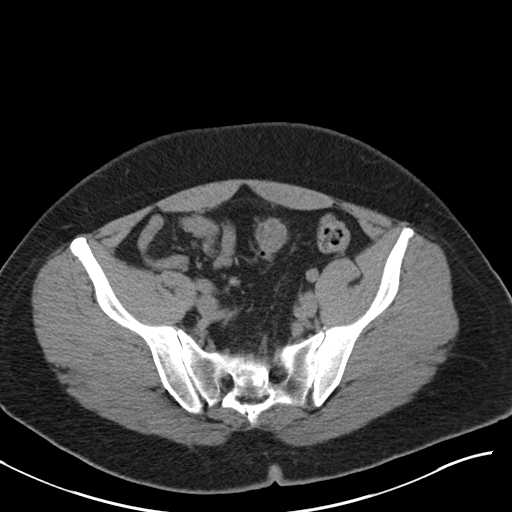
[im 41/93  soft-tissue]
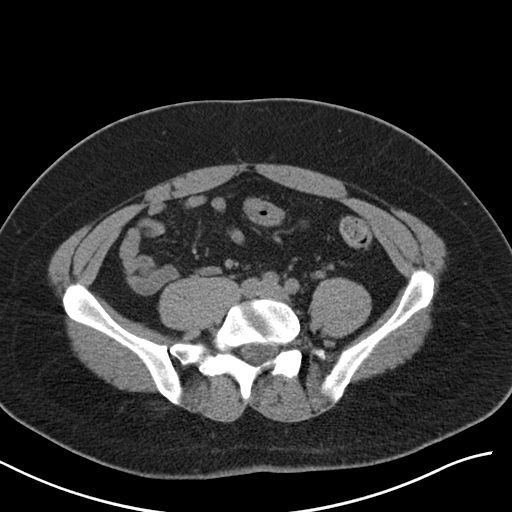
[im 47/93  soft-tissue]
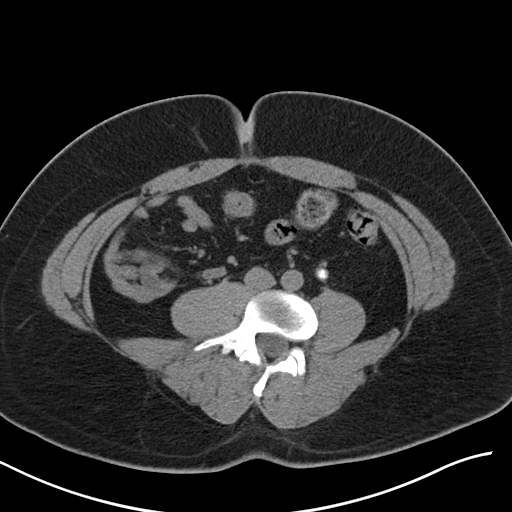
[im 52/93  soft-tissue]
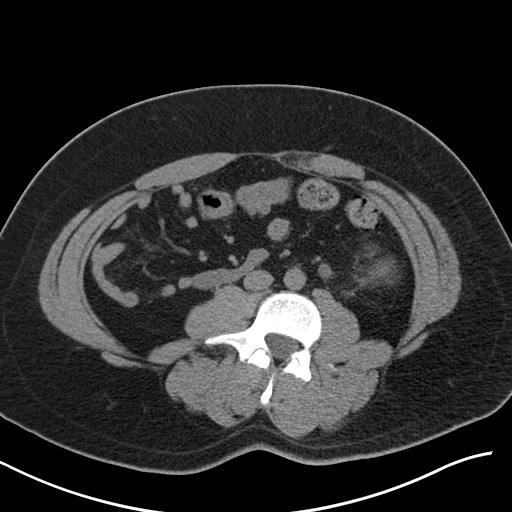
[im 62/93  soft-tissue]
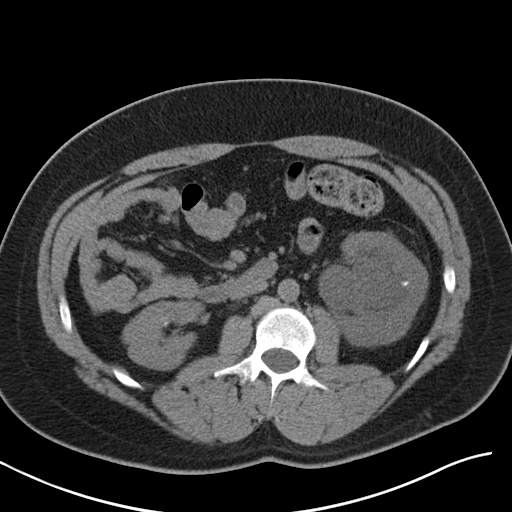
[im 62/93  bone]
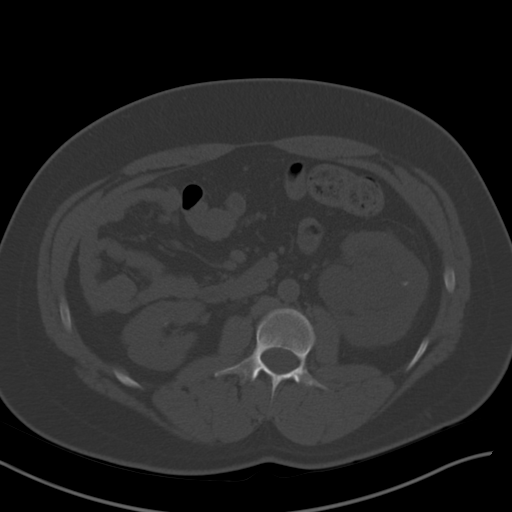
[im 67/93  soft-tissue]
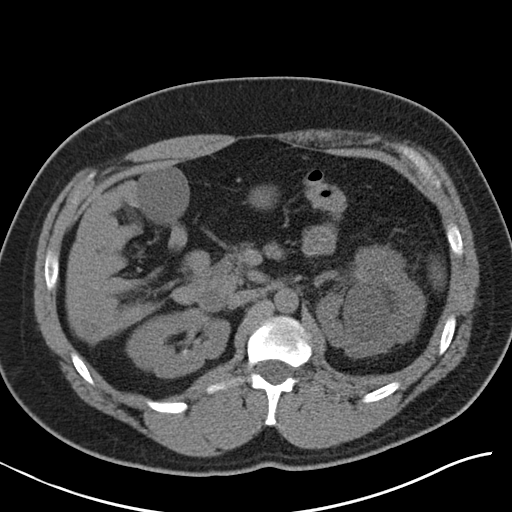
[im 72/93  soft-tissue]
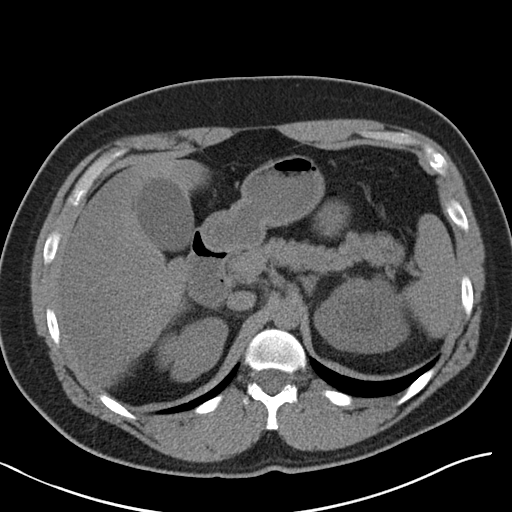
[im 82/93  soft-tissue]
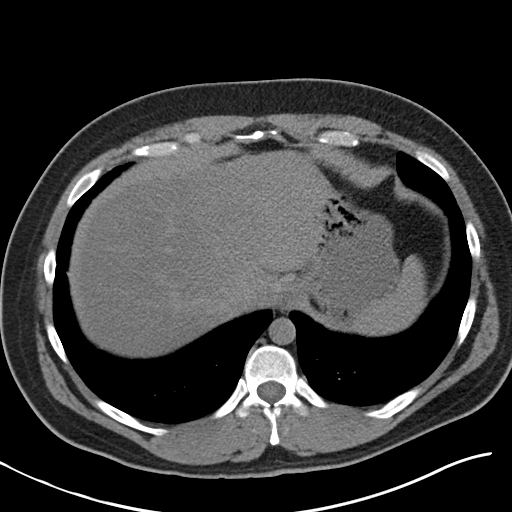
[im 87/93  soft-tissue]
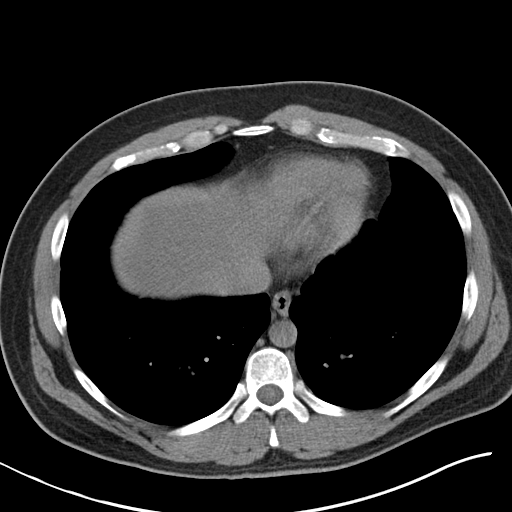

[Series 3: coronal · coronal · 0.71mm/px · 3 of 143 slices shown]
[im 48/143  soft-tissue]
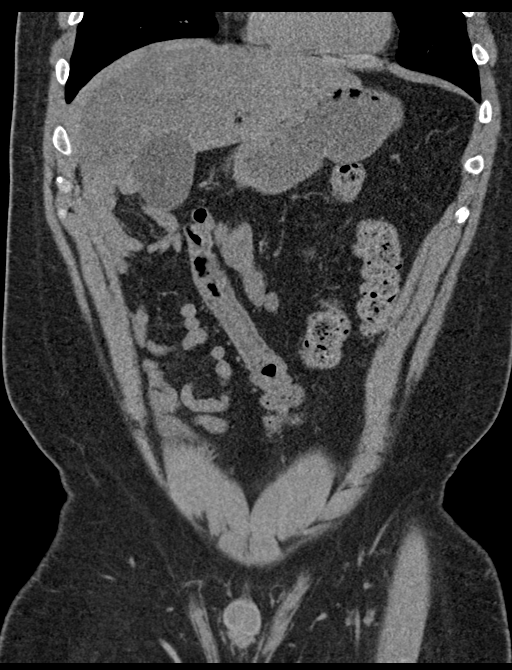
[im 64/143  soft-tissue]
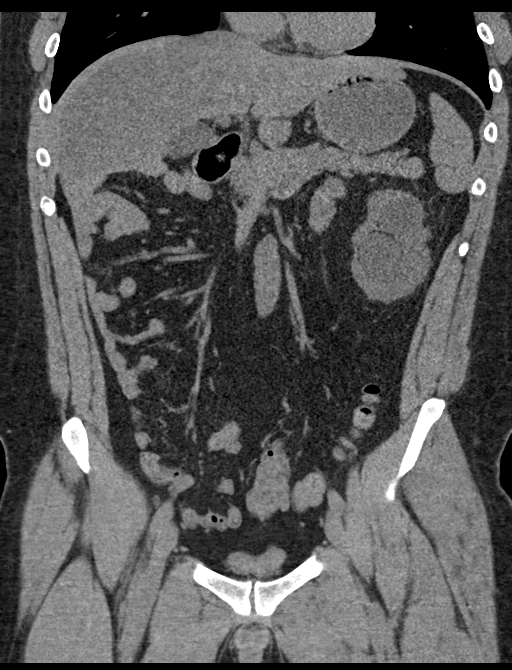
[im 79/143  soft-tissue]
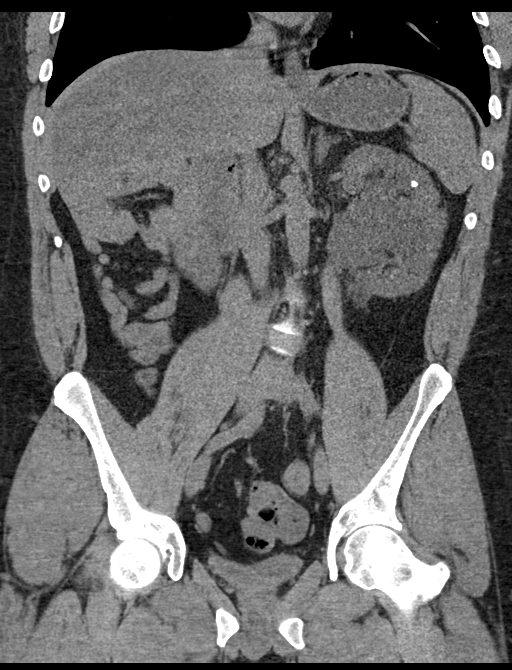

[16 of 46 positions shown; findings below may reference images not displayed]

FINDINGS: Lower chest: No acute abnormality.

Hepatobiliary: Fatty infiltration of the liver is noted. The
gallbladder is within normal limits.

Pancreas: Unremarkable. No pancreatic ductal dilatation or
surrounding inflammatory changes.

Spleen: Normal in size without focal abnormality.

Adrenals/Urinary Tract: Adrenal glands are within normal limits.
Right kidney demonstrates multiple nonobstructing renal calculi.
Ureter on the right is within normal limits. Left kidney is somewhat
enlarged with multiple nonobstructing renal calculi similar to that
seen on prior CT examination. Significant hydronephrosis and
hydroureter is noted secondary to multiple stones within the left
ureter. The largest of these measures approximately 7 mm. The more
distal left ureter is within normal limits. The bladder is
decompressed.

Stomach/Bowel: No obstructive or inflammatory changes are noted in
the colon. The appendix is within normal limits. No obstructive or
inflammatory changes of the colon are seen.

Vascular/Lymphatic: No significant vascular findings are present. No
enlarged abdominal or pelvic lymph nodes.

Reproductive: Prostate is unremarkable.

Other: No abdominal wall hernia or abnormality. No abdominopelvic
ascites.

Musculoskeletal: No acute or significant osseous findings.
IMPRESSION: Multiple stones within the mid left ureter causing obstructive
significant hydronephrosis and hydroureter.

Bilateral nonobstructing renal calculi. The largest of these
measures 6 mm in the midportion of the left kidney.

Fatty liver.

## 2022-11-18 ENCOUNTER — Other Ambulatory Visit (HOSPITAL_COMMUNITY): Payer: Self-pay

## 2023-07-27 ENCOUNTER — Encounter (HOSPITAL_COMMUNITY): Payer: Self-pay

## 2023-07-27 ENCOUNTER — Emergency Department (HOSPITAL_COMMUNITY): Payer: BC Managed Care – PPO | Admitting: Anesthesiology

## 2023-07-27 ENCOUNTER — Encounter (HOSPITAL_COMMUNITY)
Admission: EM | Disposition: A | Discharge status: Home / Self Care | Payer: Self-pay | Source: Home / Self Care | Attending: Emergency Medicine

## 2023-07-27 ENCOUNTER — Emergency Department (HOSPITAL_COMMUNITY): Payer: BC Managed Care – PPO

## 2023-07-27 ENCOUNTER — Ambulatory Visit (HOSPITAL_COMMUNITY)
Admission: EM | Admit: 2023-07-27 | Discharge: 2023-07-27 | Disposition: A | Discharge status: Home / Self Care | Payer: BC Managed Care – PPO | Attending: Urology | Admitting: Urology

## 2023-07-27 DIAGNOSIS — N132 Hydronephrosis with renal and ureteral calculous obstruction: Secondary | ICD-10-CM | POA: Diagnosis not present

## 2023-07-27 DIAGNOSIS — N201 Calculus of ureter: Secondary | ICD-10-CM

## 2023-07-27 DIAGNOSIS — Z87891 Personal history of nicotine dependence: Secondary | ICD-10-CM | POA: Diagnosis not present

## 2023-07-27 HISTORY — DX: Hypothyroidism, unspecified: E03.9

## 2023-07-27 HISTORY — PX: CYSTOSCOPY/URETEROSCOPY/HOLMIUM LASER/STENT PLACEMENT: SHX6546

## 2023-07-27 LAB — CBC WITH DIFFERENTIAL/PLATELET
Abs Immature Granulocytes: 0.02 10*3/uL (ref 0.00–0.07)
Basophils Absolute: 0 10*3/uL (ref 0.0–0.1)
Basophils Relative: 1 %
Eosinophils Absolute: 0 10*3/uL (ref 0.0–0.5)
Eosinophils Relative: 0 %
HCT: 44.6 % (ref 39.0–52.0)
Hemoglobin: 14.9 g/dL (ref 13.0–17.0)
Immature Granulocytes: 0 %
Lymphocytes Relative: 42 %
Lymphs Abs: 3.2 10*3/uL (ref 0.7–4.0)
MCH: 28.2 pg (ref 26.0–34.0)
MCHC: 33.4 g/dL (ref 30.0–36.0)
MCV: 84.5 fL (ref 80.0–100.0)
Monocytes Absolute: 0.6 10*3/uL (ref 0.1–1.0)
Monocytes Relative: 8 %
Neutro Abs: 3.7 10*3/uL (ref 1.7–7.7)
Neutrophils Relative %: 49 %
Platelets: 203 10*3/uL (ref 150–400)
RBC: 5.28 MIL/uL (ref 4.22–5.81)
RDW: 13.1 % (ref 11.5–15.5)
WBC: 7.5 10*3/uL (ref 4.0–10.5)
nRBC: 0 % (ref 0.0–0.2)

## 2023-07-27 LAB — URINALYSIS, W/ REFLEX TO CULTURE (INFECTION SUSPECTED)
Bilirubin Urine: NEGATIVE
Glucose, UA: NEGATIVE mg/dL
Ketones, ur: NEGATIVE mg/dL
Nitrite: NEGATIVE
Protein, ur: 30 mg/dL — AB
Specific Gravity, Urine: 1.013 (ref 1.005–1.030)
pH: 6 (ref 5.0–8.0)

## 2023-07-27 LAB — BASIC METABOLIC PANEL
Anion gap: 8 (ref 5–15)
BUN: 14 mg/dL (ref 6–20)
CO2: 23 mmol/L (ref 22–32)
Calcium: 9.3 mg/dL (ref 8.9–10.3)
Chloride: 105 mmol/L (ref 98–111)
Creatinine, Ser: 0.95 mg/dL (ref 0.61–1.24)
GFR, Estimated: >60 mL/min (ref >60)
Glucose, Bld: 108 mg/dL — ABNORMAL HIGH (ref 70–99)
Potassium: 3.4 mmol/L — ABNORMAL LOW (ref 3.5–5.1)
Sodium: 136 mmol/L (ref 135–145)

## 2023-07-27 SURGERY — CYSTOSCOPY/URETEROSCOPY/HOLMIUM LASER/STENT PLACEMENT
Anesthesia: General | Laterality: Left

## 2023-07-27 MED ORDER — HYDROMORPHONE HCL 1 MG/ML IJ SOLN
1.0000 mg | INTRAMUSCULAR | Status: AC | PRN
  Administered 2023-07-27: 1 mg via INTRAVENOUS
  Filled 2023-07-27: qty 1

## 2023-07-27 MED ORDER — ONDANSETRON HCL 4 MG/2ML IJ SOLN
INTRAMUSCULAR | Status: AC
  Filled 2023-07-27: qty 2

## 2023-07-27 MED ORDER — HYDROMORPHONE HCL 1 MG/ML IJ SOLN
1.0000 mg | Freq: Once | INTRAMUSCULAR | Status: AC
  Administered 2023-07-27: 1 mg via INTRAVENOUS
  Filled 2023-07-27: qty 1

## 2023-07-27 MED ORDER — LIDOCAINE 2% (20 MG/ML) 5 ML SYRINGE
INTRAMUSCULAR | Status: DC | PRN
  Administered 2023-07-27: 100 mg via INTRAVENOUS

## 2023-07-27 MED ORDER — ORAL CARE MOUTH RINSE: 15.0000 mL | Freq: Once | OROMUCOSAL | Status: AC

## 2023-07-27 MED ORDER — ONDANSETRON HCL 4 MG/2ML IJ SOLN
4.0000 mg | Freq: Once | INTRAMUSCULAR | Status: AC
  Administered 2023-07-27: 4 mg via INTRAVENOUS
  Filled 2023-07-27: qty 2

## 2023-07-27 MED ORDER — PROMETHAZINE HCL 25 MG/ML IJ SOLN: 6.2500 mg | INTRAMUSCULAR | Status: DC | PRN

## 2023-07-27 MED ORDER — PROPOFOL 10 MG/ML IV BOLUS
INTRAVENOUS | Status: AC
  Filled 2023-07-27: qty 20

## 2023-07-27 MED ORDER — DEXAMETHASONE SODIUM PHOSPHATE 10 MG/ML IJ SOLN
INTRAMUSCULAR | Status: DC | PRN
  Administered 2023-07-27: 8 mg via INTRAVENOUS

## 2023-07-27 MED ORDER — LACTATED RINGERS IV SOLN
INTRAVENOUS | Status: DC
  Administered 2023-07-27: 1000 mL via INTRAVENOUS

## 2023-07-27 MED ORDER — CEFAZOLIN SODIUM-DEXTROSE 2-4 GM/100ML-% IV SOLN
INTRAVENOUS | Status: AC
  Filled 2023-07-27: qty 100

## 2023-07-27 MED ORDER — HYDROMORPHONE HCL 1 MG/ML IJ SOLN
INTRAMUSCULAR | Status: AC
  Filled 2023-07-27: qty 1

## 2023-07-27 MED ORDER — LIDOCAINE HCL (PF) 2 % IJ SOLN
INTRAMUSCULAR | Status: AC
  Filled 2023-07-27: qty 5

## 2023-07-27 MED ORDER — OXYCODONE HCL 5 MG PO TABS
5.0000 mg | ORAL_TABLET | Freq: Once | ORAL | Status: AC
  Administered 2023-07-27: 5 mg via ORAL

## 2023-07-27 MED ORDER — PHENAZOPYRIDINE HCL 200 MG PO TABS: 200.0000 mg | ORAL_TABLET | Freq: Three times a day (TID) | ORAL | 0 refills | Status: DC | PRN

## 2023-07-27 MED ORDER — MIDAZOLAM HCL 5 MG/5ML IJ SOLN
INTRAMUSCULAR | Status: DC | PRN
  Administered 2023-07-27: 2 mg via INTRAVENOUS

## 2023-07-27 MED ORDER — DEXMEDETOMIDINE HCL IN NACL 80 MCG/20ML IV SOLN
INTRAVENOUS | Status: AC
  Filled 2023-07-27: qty 20

## 2023-07-27 MED ORDER — DEXMEDETOMIDINE HCL IN NACL 80 MCG/20ML IV SOLN
INTRAVENOUS | Status: DC | PRN
Start: 2023-07-27 — End: 2023-07-29
  Administered 2023-07-27: 8 ug via INTRAVENOUS

## 2023-07-27 MED ORDER — FENTANYL CITRATE (PF) 100 MCG/2ML IJ SOLN
INTRAMUSCULAR | Status: AC
  Filled 2023-07-27: qty 2

## 2023-07-27 MED ORDER — MEPERIDINE HCL 50 MG/ML IJ SOLN: 6.2500 mg | INTRAMUSCULAR | Status: DC | PRN

## 2023-07-27 MED ORDER — EPHEDRINE 5 MG/ML INJ
INTRAVENOUS | Status: AC
  Filled 2023-07-27: qty 5

## 2023-07-27 MED ORDER — PROPOFOL 10 MG/ML IV BOLUS
INTRAVENOUS | Status: DC | PRN
  Administered 2023-07-27: 200 mg via INTRAVENOUS
  Administered 2023-07-27: 50 mg via INTRAVENOUS

## 2023-07-27 MED ORDER — ONDANSETRON HCL 4 MG/2ML IJ SOLN
4.0000 mg | Freq: Once | INTRAMUSCULAR | Status: AC
  Administered 2023-07-27: 4 mg via INTRAVENOUS

## 2023-07-27 MED ORDER — ACETAMINOPHEN 10 MG/ML IV SOLN: 1000.0000 mg | Freq: Once | INTRAVENOUS | Status: DC | PRN

## 2023-07-27 MED ORDER — TRAMADOL HCL 50 MG PO TABS: 50.0000 mg | ORAL_TABLET | Freq: Four times a day (QID) | ORAL | 0 refills | Status: DC | PRN

## 2023-07-27 MED ORDER — IOHEXOL 300 MG/ML  SOLN
INTRAMUSCULAR | Status: DC | PRN
  Administered 2023-07-27: 9 mL

## 2023-07-27 MED ORDER — MIDAZOLAM HCL 2 MG/2ML IJ SOLN
INTRAMUSCULAR | Status: AC
  Filled 2023-07-27: qty 2

## 2023-07-27 MED ORDER — KETOROLAC TROMETHAMINE 30 MG/ML IJ SOLN
30.0000 mg | Freq: Once | INTRAMUSCULAR | Status: AC
  Administered 2023-07-27: 30 mg via INTRAVENOUS
  Filled 2023-07-27: qty 1

## 2023-07-27 MED ORDER — SODIUM CHLORIDE 0.9 % IR SOLN
Status: DC | PRN
  Administered 2023-07-27: 3000 mL via INTRAVESICAL

## 2023-07-27 MED ORDER — DEXAMETHASONE SODIUM PHOSPHATE 10 MG/ML IJ SOLN
INTRAMUSCULAR | Status: AC
  Filled 2023-07-27: qty 1

## 2023-07-27 MED ORDER — CHLORHEXIDINE GLUCONATE 0.12 % MT SOLN
15.0000 mL | Freq: Once | OROMUCOSAL | Status: AC
  Administered 2023-07-27: 15 mL via OROMUCOSAL

## 2023-07-27 MED ORDER — FENTANYL CITRATE (PF) 100 MCG/2ML IJ SOLN
INTRAMUSCULAR | Status: DC | PRN
  Administered 2023-07-27: 100 ug via INTRAVENOUS

## 2023-07-27 MED ORDER — CEFAZOLIN SODIUM-DEXTROSE 2-4 GM/100ML-% IV SOLN
2.0000 g | INTRAVENOUS | Status: AC
  Administered 2023-07-27: 2 g via INTRAVENOUS

## 2023-07-27 MED ORDER — OXYCODONE HCL 5 MG PO TABS
ORAL_TABLET | ORAL | Status: AC
  Filled 2023-07-27: qty 1

## 2023-07-27 MED ORDER — METOCLOPRAMIDE HCL 5 MG/ML IJ SOLN
10.0000 mg | INTRAMUSCULAR | Status: AC
  Administered 2023-07-27: 10 mg via INTRAVENOUS
  Filled 2023-07-27: qty 2

## 2023-07-27 MED ORDER — TAMSULOSIN HCL 0.4 MG PO CAPS: 0.4000 mg | ORAL_CAPSULE | Freq: Every day | ORAL | 0 refills | Status: AC

## 2023-07-27 MED ORDER — ONDANSETRON HCL 4 MG/2ML IJ SOLN
INTRAMUSCULAR | Status: DC | PRN
  Administered 2023-07-27: 4 mg via INTRAVENOUS

## 2023-07-27 MED ORDER — HYDROMORPHONE HCL 1 MG/ML IJ SOLN
0.2500 mg | INTRAMUSCULAR | Status: DC | PRN
  Administered 2023-07-27 (×4): 0.5 mg via INTRAVENOUS

## 2023-07-27 SURGICAL SUPPLY — 24 items
BAG COUNTER SPONGE SURGICOUNT (BAG) IMPLANT
BAG SPNG CNTER NS LX DISP (BAG) ×1
BAG URO CATCHER STRL LF (MISCELLANEOUS) ×1 IMPLANT
BASKET ZERO TIP NITINOL 2.4FR (BASKET) IMPLANT
BSKT STON RTRVL ZERO TP 2.4FR (BASKET) ×1
CATH URETL OPEN END 6FR 70 (CATHETERS) IMPLANT
CLOTH BEACON ORANGE TIMEOUT ST (SAFETY) ×1 IMPLANT
GLOVE SURG LX STRL 7.5 STRW (GLOVE) ×1 IMPLANT
GOWN STRL REUS W/ TWL XL LVL3 (GOWN DISPOSABLE) ×1 IMPLANT
GOWN STRL REUS W/TWL XL LVL3 (GOWN DISPOSABLE) ×1
GUIDEWIRE STR DUAL SENSOR (WIRE) ×1 IMPLANT
GUIDEWIRE ZIPWRE .038 STRAIGHT (WIRE) IMPLANT
IV NS 1000ML (IV SOLUTION) ×1
IV NS 1000ML BAXH (IV SOLUTION) ×1 IMPLANT
KIT TURNOVER KIT A (KITS) IMPLANT
LASER FIB FLEXIVA PULSE ID 365 (Laser) IMPLANT
MANIFOLD NEPTUNE II (INSTRUMENTS) ×1 IMPLANT
PACK CYSTO (CUSTOM PROCEDURE TRAY) ×1 IMPLANT
SHEATH NAVIGATOR HD 12/14X36 (SHEATH) IMPLANT
STENT URET 6FRX26 CONTOUR (STENTS) IMPLANT
TRACTIP FLEXIVA PULS ID 200XHI (Laser) IMPLANT
TRACTIP FLEXIVA PULSE ID 200 (Laser) IMPLANT
TUBING CONNECTING 10 (TUBING) ×1 IMPLANT
TUBING UROLOGY SET (TUBING) ×1 IMPLANT

## 2023-07-27 NOTE — Anesthesia Procedure Notes (Signed)
Procedure Name: LMA Insertion Date/Time: 07/27/2023 5:36 PM  Performed by: Maurene Capes, CRNAPre-anesthesia Checklist: Patient identified, Emergency Drugs available, Suction available and Patient being monitored Patient Re-evaluated:Patient Re-evaluated prior to induction Oxygen Delivery Method: Circle System Utilized Preoxygenation: Pre-oxygenation with 100% oxygen Induction Type: IV induction Ventilation: Mask ventilation without difficulty LMA: LMA inserted LMA Size: 4.0 Number of attempts: 1 Airway Equipment and Method: Bite block Placement Confirmation: positive ETCO2 Tube secured with: Tape Dental Injury: Teeth and Oropharynx as per pre-operative assessment

## 2023-07-27 NOTE — ED Triage Notes (Signed)
Pt arrived POV for recurrent kidney stone, pain to left flank with nausea since 2am. Pt has extensive hx of same. A&O x4.

## 2023-07-27 NOTE — Op Note (Signed)
Preoperative diagnosis: 6 mm left ureteral calculus  Postoperative diagnosis: 6 mm left ureteral calculus  Procedure:  Cystoscopy Left ureteroscopy and stone removal Ureteroscopic laser lithotripsy Left ureteral stent placement (6 x 26 with string) Left retrograde pyelography with interpretation  Surgeon: Moody Bruins. M.D.  Anesthesia: General  Complications: None  Intraoperative findings: Left retrograde pyelography demonstrated a filling defect within the proximal left ureter consistent with the patient's known calculus without other abnormalities noted.  EBL: Minimal  Specimens: Left ureteral calculus  Disposition of specimens: [facility] for stone analysis  Indication: Thomas Howell II  is a 31 y.o. patient with urolithiasis who presented with a 6 mm obstructing left ureteral stone. After reviewing the management options for treatment, they elected to proceed with the above surgical procedure(s). We have discussed the potential benefits and risks of the procedure, side effects of the proposed treatment, the likelihood of the patient achieving the goals of the procedure, and any potential problems that might occur during the procedure or recuperation. Informed consent has been obtained.  Description of procedure:  The patient was taken to the operating room and general anesthesia was induced.  The patient was placed in the dorsal lithotomy position, prepped and draped in the usual sterile fashion, and preoperative antibiotics were administered. A preoperative time-out was performed.   Cystourethroscopy was performed.  The patient's urethra was examined and was normal. The bladder was then systematically examined in its entirety. There was no evidence for any bladder tumors, stones, or other mucosal pathology.    Attention then turned to the left ureteral orifice and a ureteral catheter was used to intubate the ureteral orifice.  Omnipaque  contrast was injected through the ureteral catheter and a retrograde pyelogram was performed with findings as dictated above.  A 0.38 sensor guidewire was then advanced up the left ureter into the renal pelvis under fluoroscopic guidance.  A 12/14 Fr ureteral access sheath was then advanced over the guide wire. The digital flexible ureteroscope was then advanced through the access sheath into the ureter next to the guidewire and the calculus was identified and was located in the proximal left ureter.   The stone was then fragmented with the 200 micron holmium laser fiber on a setting of 0.6 J and frequency of 6 Hz.   All sizable stones were then removed with a zero tip nitinol basket.  Reinspection of the ureter/renal pelvis revealed no remaining visible stones or fragments of significant size.   The safety wire was then replaced and the access sheath removed.  The guidewire was backloaded through the cystoscope and a ureteral stent was advance over the wire using Seldinger technique.  The stent was positioned appropriately under fluoroscopic and cystoscopic guidance.  The wire was then removed with an adequate stent curl noted in the renal pelvis as well as in the bladder.  The bladder was then emptied and the procedure ended.  The patient appeared to tolerate the procedure well and without complications.  The patient was able to be awakened and transferred to the recovery unit in satisfactory condition.   Moody Bruins MD

## 2023-07-27 NOTE — Discharge Instructions (Addendum)
You may see some blood in the urine and may have some burning with urination for 48-72 hours. You also may notice that you have to urinate more frequently or urgently after your procedure which is normal.  You should call should you develop an inability urinate, fever > 101, persistent nausea and vomiting that prevents you from eating or drinking to stay hydrated.  If you have a stent, you will likely urinate more frequently and urgently until the stent is removed and you may experience some discomfort/pain in the lower abdomen and flank especially when urinating. You may take pain medication prescribed to you if needed for pain. You may also intermittently have blood in the urine until the stent is removed. You may remove your stent on Monday morning.  Simply pull the string that is taped to your body and the stent will easily come out.  This may be best done in the shower as some urine may come out with the stent.  Usually you will feel relief once the stent is removed, but occasionally patients can develop pain due to residual swelling of the ureter that may temporarily obstruct the kidney.  This can be managed by taking pain medication and it will typically resolve with time.  Please do not hesitate to call if you have pain that is not controlled with your pain medication or does not improved within 24-48 hours.  

## 2023-07-27 NOTE — Consult Note (Signed)
Urology Consult   Physician requesting consult: Dr. Preston Fleeting  Reason for consult: left 6mm proximal ureteral stone  History of Present Illness: Thomas Howell is a 32 y.o. male well-known to the urology service with a long history of recurrent nephrolithiasis.  He has undergone multiple prior stone interventions in the past.  He is followed by Dr. Berneice Heinrich.  Last stone procedure 05/2022.  Patient presented to the emergency department today with acute onset of left-sided flank pain and nausea.  CT stone study demonstrated a 6 mm left proximal stone midway between the left UPJ and left iliac artery.  There is moderate proximal hydroureteronephrosis.  Patient also has additional nonobstructing left renal calculi.  On evaluation in the emergency department laboratory studies demonstrated normal renal function.  He has no leukocytosis and no history of any fever or chills.  Urinalysis is remarkable for significant microscopic hematuria and pyuria.  There is nitrite negative and there are only rare bacteria microscopically.  KUB-  left ureteral stone well visualized in proximal ureter.  Past Medical History:  Diagnosis Date   Complication of anesthesia    Dysrhythmia    hx of SVT ablation in 2019 - DR Lewayne Bunting dismissed by MD    Frequency-urgency syndrome    History of kidney stones    Hx of nausea and vomiting    d/t kidney stone   Hx of sepsis 08/11/2017   due to kidney stone/ hydronephrosis   Left ureteral stone    Migraine    hx of migraines    Pain due to ureteral stent (HCC)    PONV (postoperative nausea and vomiting)    none recently   Scoliosis of lumbar spine 1994   treated at [facility] until age 44   Wears glasses    reading only    Past Surgical History:  Procedure Laterality Date   CYSTOSCOPY W/ RETROGRADES Right 07/06/2015   Procedure: CYSTOSCOPY WITH RETROGRADE PYELOGRAM;  Surgeon: Jerilee Field, MD;  Location: [facility];  Service: Urology;  Laterality: Right;    CYSTOSCOPY W/ URETERAL STENT PLACEMENT Left 08/08/2015   Procedure: CYSTOSCOPY WITH STENT REPLACEMENT;  Surgeon: Jerilee Field, MD;  [facility];  Service: Urology;  Laterality: Left;   CYSTOSCOPY W/ URETERAL STENT PLACEMENT Left 02/21/2017   Procedure: CYSTOSCOPY WITH RETROGRADE PYELOGRAM/URETERAL LEFT STENT PLACEMENT WITH  LASER;  Surgeon: Bjorn Pippin, MD;  Location: [facility];  Service: Urology;  Laterality: Left;   CYSTOSCOPY W/ URETERAL STENT PLACEMENT Left 08/10/2017   Procedure: CYSTOSCOPY WITH RETROGRADE PYELOGRAM/URETERAL STENT PLACEMENT;  Surgeon: Heloise Purpura, MD;  Location: [facility];  Service: Urology;  Laterality: Left;   CYSTOSCOPY W/ URETERAL STENT PLACEMENT Bilateral 04/19/2021   Procedure: CYSTOSCOPY WITH RETROGRADE PYELOGRAM/URETERAL STENT PLACEMENT;  Surgeon: Sebastian Ache, MD;  Location: [facility];  Service: Urology;  Laterality: Bilateral;   CYSTOSCOPY W/ URETERAL STENT PLACEMENT Left 07/11/2021   Procedure: CYSTOSCOPY WITH RETROGRADE PYELOGRAM/URETEROSCOPY/URETERAL STENT PLACEMENT;  Surgeon: Marcine Matar, MD;  Location: [facility];  Service: Urology;  Laterality: Left;   CYSTOSCOPY W/ URETERAL STENT PLACEMENT Left 11/04/2021   Procedure: CYSTOSCOPY WITH RETROGRADE PYELOGRAM//URETEROSCOPY/STONE EXTRACTION/URETERAL STENT PLACEMENT;  Surgeon: Marcine Matar, MD;  Location: [facility];  Service: Urology;  Laterality: Left;   CYSTOSCOPY W/ URETERAL STENT PLACEMENT Bilateral 05/04/2022   Procedure: CYSTOSCOPY WITH RETROGRADE PYELOGRAM/URETERAL STENT PLACEMENT;  Surgeon: Sebastian Ache, MD;  Location: [facility];  Service: Urology;  Laterality: Bilateral;   CYSTOSCOPY WITH RETROGRADE PYELOGRAM, URETEROSCOPY AND STENT PLACEMENT Left 06/24/2014  Procedure: CYSTOSCOPY WITH RETROGRADE PYELOGRAM,  AND STENT PLACEMENT;  Surgeon: Magdalene Molly, MD;  Location: [facility];  Service: Urology;  Laterality: Left;   CYSTOSCOPY WITH RETROGRADE PYELOGRAM, URETEROSCOPY AND STENT  PLACEMENT Left 07/05/2014   Procedure: CYSTO/LEFT URETEROSCOPY/LEFT RETROGRADE PYELOGRAM/LEFT STENT PLACEMENT;  Surgeon: Jerilee Field, MD;  Location: [facility];  Service: Urology;  Laterality: Left;   CYSTOSCOPY WITH RETROGRADE PYELOGRAM, URETEROSCOPY AND STENT PLACEMENT Left 07/06/2015   Procedure: CYSTOSCOPY WITH RETROGRADE PYELOGRAM, URETEROSCOPY , LASER, STENT PLACEMENT and BASKET EXTRACTION;  Surgeon: Jerilee Field, MD;  Location: [facility];  Service: Urology;  Laterality: Left;   CYSTOSCOPY WITH RETROGRADE PYELOGRAM, URETEROSCOPY AND STENT PLACEMENT Left 08/19/2015   Procedure: CYSTOSCOPY WITH RETROGRADE PYELOGRAM, URETEROSCOPY AND STENT PLACEMENT;  Surgeon: Jerilee Field, MD;  Location: [facility];  Service: Urology;  Laterality: Left;   CYSTOSCOPY WITH RETROGRADE PYELOGRAM, URETEROSCOPY AND STENT PLACEMENT Left 03/13/2018   Procedure: CYSTOSCOPY WITH RETROGRADE PYELOGRAM, URETEROSCOPY AND LEFT STENT PLACEMENT;  Surgeon: Bjorn Pippin, MD;  Location: [facility];  Service: Urology;  Laterality: Left;   CYSTOSCOPY WITH RETROGRADE PYELOGRAM, URETEROSCOPY AND STENT PLACEMENT Left 03/11/2021   Procedure: CYSTOSCOPY WITH RETROGRADE PYELOGRAM, URETEROSCOPY AND STENT PLACEMENT,STONE EXTRACTION,HOLMIUM LASER;  Surgeon: Marcine Matar, MD;  Location: [facility];  Service: Urology;  Laterality: Left;   CYSTOSCOPY WITH RETROGRADE PYELOGRAM, URETEROSCOPY AND STENT PLACEMENT Bilateral 05/06/2021   Procedure: CYSTOSCOPY WITH RETROGRADE PYELOGRAM, URETEROSCOPY AND STENT REPLACEMENT;  Surgeon: Sebastian Ache, MD;  Location: [facility];  Service: Urology;  Laterality: Bilateral;  90 MINS   CYSTOSCOPY WITH RETROGRADE PYELOGRAM, URETEROSCOPY AND STENT PLACEMENT Bilateral 05/19/2022   Procedure: CYSTOSCOPY WITH RETROGRADE PYELOGRAM, URETEROSCOPY AND STENT  EXCHANGE;  Surgeon: Sebastian Ache, MD;  Location: [facility];  Service: Urology;  Laterality: Bilateral;  90 MINS   CYSTOSCOPY WITH  RETROGRADE PYELOGRAM, URETEROSCOPY AND STENT PLACEMENT Bilateral 06/09/2022   Procedure: CYSTOSCOPY WITH RETROGRADE PYELOGRAM, URETEROSCOPY AND STENT  EXCHANGE;  Surgeon: Sebastian Ache, MD;  Location: [facility];  Service: Urology;  Laterality: Bilateral;  75 MINS   CYSTOSCOPY WITH URETEROSCOPY AND STENT PLACEMENT Left 01/16/2016   Procedure: CYSTOSCOPY WITH LEFT RETROGRADE PYELOGRAM  LEFT DIGITAL URETEROSCOPY AND PLACEMENT LEFT URETERAL STENT;  Surgeon: Jerilee Field, MD;  Location: [facility];  Service: Urology;  Laterality: Left;   CYSTOSCOPY WITH URETEROSCOPY, STONE BASKETRY AND STENT PLACEMENT Left 02/13/2016   Procedure: CYSTOSCOPY WITH LEFT URETEROSCOPY, HOLMIUM LASER AND STENT PLACEMENT;  Surgeon: Jerilee Field, MD;  Location: [facility];  Service: Urology;  Laterality: Left;   CYSTOSCOPY/RETROGRADE/URETEROSCOPY/STONE EXTRACTION WITH BASKET Left 08/08/2015   Procedure: CYSTOSCOPY/RETROGRADE/URETEROSCOPY/STONE EXTRACTION WITH BASKET;  Surgeon: Jerilee Field, MD;  Location: [facility];  Service: Urology;  Laterality: Left;   CYSTOSCOPY/URETEROSCOPY/HOLMIUM LASER/STENT PLACEMENT Left 07/30/2016   Procedure: CYSTOSCOPY/URETEROSCOPY/HOLMIUM LASER/STENT PLACEMENT;  Surgeon: Jerilee Field, MD;  Location: [facility];  Service: Urology;  Laterality: Left;   CYSTOSCOPY/URETEROSCOPY/HOLMIUM LASER/STENT PLACEMENT Left 08/19/2017   Procedure: CYSTOSCOPY/URETEROSCOPY/HOLMIUM LASER/STENT PLACEMENT;  Surgeon: Jerilee Field, MD;  Location: [facility];  Service: Urology;  Laterality: Left;   CYSTOSCOPY/URETEROSCOPY/HOLMIUM LASER/STENT PLACEMENT N/A 10/13/2018   Procedure: CYSTOSCOPY/RETROGRADE RIGHT URETEROSCOPY/ AND RIGHTSTENT PLACEMENT;  Surgeon: Bjorn Pippin, MD;  Location: [facility];  Service: Urology;  Laterality: N/A;   EXTRACORPOREAL SHOCK WAVE LITHOTRIPSY Left 07-07-2015  &  12-26-2014   FOOT CAPSULE RELEASE W/ PERCUTANEOUS HEEL CORD LENGTHENING, TIBIAL  TENDON TRANSFER Left 1994   clubfoot   HOLMIUM LASER APPLICATION Left 07/05/2014   Procedure: LASER LITHO;  Surgeon:  Jerilee Field, MD;  Location: [facility];  Service: Urology;  Laterality: Left;   HOLMIUM LASER APPLICATION Left 08/08/2015   Procedure: HOLMIUM LASER  WITH LITHOTRIPSY ;  Surgeon: Jerilee Field, MD;  Location: [facility];  Service: Urology;  Laterality: Left;   HOLMIUM LASER APPLICATION Left 02/21/2017   Procedure: HOLMIUM LASER APPLICATION;  Surgeon: Bjorn Pippin, MD;  Location: [facility];  Service: Urology;  Laterality: Left;   HOLMIUM LASER APPLICATION Left 03/13/2018   Procedure: HOLMIUM LASER APPLICATION;  Surgeon: Bjorn Pippin, MD;  Location: [facility];  Service: Urology;  Laterality: Left;   HOLMIUM LASER APPLICATION Bilateral 05/06/2021   Procedure: HOLMIUM LASER APPLICATION;  Surgeon: Sebastian Ache, MD;  Location: [facility];  Service: Urology;  Laterality: Bilateral;   HOLMIUM LASER APPLICATION Bilateral 05/19/2022   Procedure: HOLMIUM LASER APPLICATION;  Surgeon: Sebastian Ache, MD;  Location: [facility];  Service: Urology;  Laterality: Bilateral;   HOLMIUM LASER APPLICATION Bilateral 06/09/2022   Procedure: HOLMIUM LASER APPLICATION;  Surgeon: Sebastian Ache, MD;  Location: [facility];  Service: Urology;  Laterality: Bilateral;   lumpectomy in neck for cat scratch fever      surgery for club foot at few days old   LYMPH GLAND EXCISION  2003   neck--  benign   SVT ABLATION N/A 05/22/2018   Procedure: SVT ABLATION;  Surgeon: Marinus Maw, MD;  Location: [facility];  Service: Cardiovascular;  Laterality: N/A;   SVT ABLATION     2019    URETEROSCOPY WITH HOLMIUM LASER LITHOTRIPSY Left 08/04/2021   Procedure: URETEROSCOPY WITH basket removal of stones/ STENT exchange/retrograde;  Surgeon: Jerilee Field, MD;  Location: [facility];  Service: Urology;  Laterality: Left;  ONLY NEEDS 60 MIN     Medications:  Home meds:    Scheduled Meds: Continuous Infusions: PRN Meds:.  Allergies:  Allergies  Allergen Reactions   Bactrim [Sulfamethoxazole-Trimethoprim] Nausea And Vomiting   Ketamine Other (See Comments)    Did not like the sensation    Family History  Problem Relation Age of Onset   Cancer Father    Kidney Stones Mother    Cancer Other    Hypertension Other    Hyperlipidemia Other    Stroke Other    Kidney Stones Brother     Social History:  reports that he quit smoking about 9 years ago. His smoking use included cigarettes. He started smoking about 11 years ago. He has never used smokeless tobacco. He reports that he does not currently use alcohol. He reports that he does not use drugs.  ROS: A complete review of systems was performed.  All systems are negative except for pertinent findings as noted.  Physical Exam:  Vital signs in last 24 hours: Temp:  [98.1 F (36.7 C)-98.2 F (36.8 C)] 98.2 F (36.8 C) (08/07 0600) Pulse Rate:  [65-107] 74 (08/07 0621) Resp:  [16] 16 (08/07 0621) BP: (130-162)/(83-95) 130/83 (08/07 0621) SpO2:  [98 %-100 %] 99 % (08/07 0621) Weight:  [99.8 kg] 99.8 kg (08/07 0350) Constitutional:  Alert and oriented, No acute distress Cardiovascular: Regular rate and rhythm, No JVD Respiratory: Normal respiratory effort, Lungs clear bilaterally GI: Abdomen is soft, nontender, nondistended, no abdominal masses  Neurologic: Grossly intact, no focal deficits Psychiatric: Normal mood and affect  Laboratory Data:  Recent Labs    07/27/23 0359  WBC 7.5  HGB 14.9  HCT 44.6  PLT 203    Recent Labs  07/27/23 0359  NA 136  K 3.4*  CL 105  GLUCOSE 108*  BUN 14  CALCIUM 9.3  CREATININE 0.95     Results for orders placed or performed during the hospital encounter of 07/27/23 (from the past 24 hour(s))  CBC with Differential     Status: None   Collection Time: 07/27/23  3:59 AM  Result Value Ref Range   WBC 7.5  4.0 - 10.5 K/uL   RBC 5.28 4.22 - 5.81 MIL/uL   Hemoglobin 14.9 13.0 - 17.0 g/dL   HCT 54.0 98.1 - 19.1 %   MCV 84.5 80.0 - 100.0 fL   MCH 28.2 26.0 - 34.0 pg   MCHC 33.4 30.0 - 36.0 g/dL   RDW 47.8 29.5 - 62.1 %   Platelets 203 150 - 400 K/uL   nRBC 0.0 0.0 - 0.2 %   Neutrophils Relative % 49 %   Neutro Abs 3.7 1.7 - 7.7 K/uL   Lymphocytes Relative 42 %   Lymphs Abs 3.2 0.7 - 4.0 K/uL   Monocytes Relative 8 %   Monocytes Absolute 0.6 0.1 - 1.0 K/uL   Eosinophils Relative 0 %   Eosinophils Absolute 0.0 0.0 - 0.5 K/uL   Basophils Relative 1 %   Basophils Absolute 0.0 0.0 - 0.1 K/uL   Immature Granulocytes 0 %   Abs Immature Granulocytes 0.02 0.00 - 0.07 K/uL  Basic metabolic panel     Status: Abnormal   Collection Time: 07/27/23  3:59 AM  Result Value Ref Range   Sodium 136 135 - 145 mmol/L   Potassium 3.4 (L) 3.5 - 5.1 mmol/L   Chloride 105 98 - 111 mmol/L   CO2 23 22 - 32 mmol/L   Glucose, Bld 108 (H) 70 - 99 mg/dL   BUN 14 6 - 20 mg/dL   Creatinine, Ser 3.08 0.61 - 1.24 mg/dL   Calcium 9.3 8.9 - 65.7 mg/dL   GFR, Estimated >84 >69 mL/min   Anion gap 8 5 - 15  Urinalysis, w/ Reflex to Culture (Infection Suspected) -Urine, Clean Catch     Status: Abnormal   Collection Time: 07/27/23  4:18 AM  Result Value Ref Range   Specimen Source URINE, CLEAN CATCH    Color, Urine YELLOW YELLOW   APPearance HAZY (A) CLEAR   Specific Gravity, Urine 1.013 1.005 - 1.030   pH 6.0 5.0 - 8.0   Glucose, UA NEGATIVE NEGATIVE mg/dL   Hgb urine dipstick SMALL (A) NEGATIVE   Bilirubin Urine NEGATIVE NEGATIVE   Ketones, ur NEGATIVE NEGATIVE mg/dL   Protein, ur 30 (A) NEGATIVE mg/dL   Nitrite NEGATIVE NEGATIVE   Leukocytes,Ua MODERATE (A) NEGATIVE   RBC / HPF 21-50 0 - 5 RBC/hpf   WBC, UA 21-50 0 - 5 WBC/hpf   Bacteria, UA RARE (A) NONE SEEN   Squamous Epithelial / HPF 0-5 0 - 5 /HPF   Mucus PRESENT    Hyaline Casts, UA PRESENT    No results found for this or any previous visit (from  the past 240 hour(s)).  Renal Function: Recent Labs    07/27/23 0359  CREATININE 0.95   Estimated Creatinine Clearance: 126.8 mL/min (by C-G formula based on SCr of 0.95 mg/dL).  Radiologic Imaging: DG Abd 1 View  Result Date: 07/27/2023 CLINICAL DATA:  32 year old male with obstructing proximal left ureteral calculus. EXAM: ABDOMEN - 1 VIEW COMPARISON:  CT Abdomen and Pelvis 0438 hours today. FINDINGS: The left ureteral calculus is visible radiographically projecting  to the left of the apex of the levoconvex lumbar spine curvature (arrow). This appears to be the L3-L4 level, although there is evidence of transitional lumbosacral anatomy. Negative bowel gas pattern. No acute osseous abnormality identified. Chronic left hemipelvis phlebolith. IMPRESSION: Radiographically visible obstructing left ureteral calculus, projecting to the left of the mid lumbar spine. Electronically Signed   By: Odessa Fleming M.D.   On: 07/27/2023 06:41   CT Renal Stone Study  Result Date: 07/27/2023 CLINICAL DATA:  32 year old male with history of left-sided flank pain and nausea. History of kidney stones. EXAM: CT ABDOMEN AND PELVIS WITHOUT CONTRAST TECHNIQUE: Multidetector CT imaging of the abdomen and pelvis was performed following the standard protocol without IV contrast. RADIATION DOSE REDUCTION: This exam was performed according to the departmental dose-optimization program which includes automated exposure control, adjustment of the mA and/or kV according to patient size and/or use of iterative reconstruction technique. COMPARISON:  CT of the abdomen and pelvis 05/04/2022. FINDINGS: Lower chest: Unremarkable. Hepatobiliary: Diffuse low attenuation throughout the hepatic parenchyma, indicative of a background of hepatic steatosis. No discrete cystic or solid hepatic lesions are confidently identified on today's noncontrast CT examination. Unenhanced appearance of the gallbladder is unremarkable. Pancreas: No definite  pancreatic mass or peripancreatic fluid collections or inflammatory changes are noted on today's noncontrast CT examination. Spleen: Unremarkable. Adrenals/Urinary Tract: Multiple nonobstructive calculi are noted within the collecting systems of both kidneys, largest of which is in the upper pole collecting system of the left kidney measuring up to 8 mm. In addition, in the proximal third of the left ureter (coronal image 80 of series 5) there is a 6 mm calculus which is associated with mild proximal left hydroureteronephrosis. No additional calculi are noted elsewhere along the course of either ureter or within the lumen of the urinary bladder. No right hydroureteronephrosis. Urinary bladder is nearly completely decompressed, but otherwise unremarkable in appearance. Bilateral adrenal glands are normal in appearance. Stomach/Bowel: Unenhanced appearance of the stomach is normal. No pathologic dilatation of small bowel or colon. 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 Normal appendix. Vascular/Lymphatic: No atherosclerotic calcifications are noted in the abdominal aorta or pelvic vasculature. No lymphadenopathy noted in the abdomen or pelvis. Reproductive: Prostate gland and seminal vesicles are unremarkable in appearance. Other: No significant volume of ascites.  No pneumoperitoneum. Musculoskeletal: There are no aggressive appearing lytic or blastic lesions noted in the visualized portions of the skeleton. IMPRESSION: 1. 6 mm obstructing calculus in the proximal third of the left ureter with mild proximal left hydroureteronephrosis. 2. Multiple additional nonobstructive calculi in the collecting systems of both kidneys measuring up to 8 mm in the upper pole collecting system of left kidney. 3. Hepatic steatosis. 4. 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 Electronically Signed   By: Trudie Reed M.D.   On: 07/27/2023 05:37    I independently reviewed the above imaging studies.  Impression/Recommendation Left 6 mm proximal ureteral calculus with obstruction. As noted the patient has had multiple prior stone episodes.  Today I did discuss with him in detail options for management of his ureteral calculus including spontaneous stone passage with medical expulsive therapy, ESWL, and endoscopic management.  The patient given his prior history is very biased and has not ever been able to pass a stone much over 3 mm in the past.  In addition, he has had suboptimal results previously with ESWL.  He prefers endoscopic management  clearly.  I discussed with him today the option of cystoscopy with retrograde left ureteroscopy with laser lithotripsy and placement of a double-J stent.  Nature procedure reviewed in detail including potential adverse events and complications.  Also emphasized to the patient the potential of not being able to access the proximal stone at the initial setting and need for prestenting with delayed definitive management.  Patient expresses understanding of these issues.  Informed consent was obtained and the patient was appropriately marked.  Joline Maxcy 07/27/2023, 7:29 AM

## 2023-07-27 NOTE — ED Notes (Signed)
ED TO INPATIENT HANDOFF REPORT  ED Nurse Name and Phone #:  Crist Infante, RN [phone]  S Name/Age/Gender Thomas Howell 32 y.o. male Room/Bed: WA15/WA15  Code Status   Code Status: Prior  Home/SNF/Other Home Patient oriented to: self, place, time, and situation Is this baseline? Yes   Triage Complete: Triage complete  Chief Complaint Kidney Stones  Triage Note Pt arrived POV for recurrent kidney stone, pain to left flank with nausea since 2am. Pt has extensive hx of same. A&O x4.    Allergies Allergies  Allergen Reactions   Bactrim [Sulfamethoxazole-Trimethoprim] Nausea And Vomiting   Ketamine Other (See Comments)    Did not like the sensation    Level of Care/Admitting Diagnosis ED Disposition     ED Disposition  To OR/IR/Endo...   Condition  --   Comment  --         B Medical/Surgery History Past Medical History:  Diagnosis Date   Complication of anesthesia    Dysrhythmia    hx of SVT ablation in 2019 - DR Lewayne Bunting dismissed by MD    Frequency-urgency syndrome    History of kidney stones    Hx of nausea and vomiting    d/t kidney stone   Hx of sepsis 08/11/2017   due to kidney stone/ hydronephrosis   Left ureteral stone    Migraine    hx of migraines    Pain due to ureteral stent (HCC)    PONV (postoperative nausea and vomiting)    none recently   Scoliosis of lumbar spine 1994   treated at [facility] until age 30   Wears glasses    reading only   Past Surgical History:  Procedure Laterality Date   CYSTOSCOPY W/ RETROGRADES Right 07/06/2015   Procedure: CYSTOSCOPY WITH RETROGRADE PYELOGRAM;  Surgeon: Jerilee Field, MD;  Location: [facility];  Service: Urology;  Laterality: Right;   CYSTOSCOPY W/ URETERAL STENT PLACEMENT Left 08/08/2015   Procedure: CYSTOSCOPY WITH STENT REPLACEMENT;  Surgeon: Jerilee Field, MD;  Location: [facility];  Service: Urology;  Laterality: Left;   CYSTOSCOPY W/ URETERAL STENT PLACEMENT Left  02/21/2017   Procedure: CYSTOSCOPY WITH RETROGRADE PYELOGRAM/URETERAL LEFT STENT PLACEMENT WITH  LASER;  Surgeon: Bjorn Pippin, MD;  Location: [facility];  Service: Urology;  Laterality: Left;   CYSTOSCOPY W/ URETERAL STENT PLACEMENT Left 08/10/2017   Procedure: CYSTOSCOPY WITH RETROGRADE PYELOGRAM/URETERAL STENT PLACEMENT;  Surgeon: Heloise Purpura, MD;  Location: [facility];  Service: Urology;  Laterality: Left;   CYSTOSCOPY W/ URETERAL STENT PLACEMENT Bilateral 04/19/2021   Procedure: CYSTOSCOPY WITH RETROGRADE PYELOGRAM/URETERAL STENT PLACEMENT;  Surgeon: Sebastian Ache, MD;  Location[facility];  Service: Urology;  Laterality: Bilateral;   CYSTOSCOPY W/ URETERAL STENT PLACEMENT Left 07/11/2021   Procedure: CYSTOSCOPY WITH RETROGRADE PYELOGRAM/URETEROSCOPY/URETERAL STENT PLACEMENT;  Surgeon: Marcine Matar, MD;  Location[facility];  Service: Urology;  Laterality: Left;   CYSTOSCOPY W/ URETERAL STENT PLACEMENT Left 11/04/2021   Procedure: CYSTOSCOPY WITH RETROGRADE PYELOGRAM//URETEROSCOPY/STONE EXTRACTION/URETERAL STENT PLACEMENT;  Surgeon: Marcine Matar, MD;  Location[facility];  Service: Urology;  Laterality: Left;   CYSTOSCOPY W/ URETERAL STENT PLACEMENT Bilateral 05/04/2022   Procedure: CYSTOSCOPY WITH RETROGRADE PYELOGRAM/URETERAL STENT PLACEMENT;  Surgeon: Sebastian Ache, MD;  Location[facility];  Service: Urology;  Laterality: Bilateral;   CYSTOSCOPY WITH RETROGRADE PYELOGRAM, URETEROSCOPY AND STENT PLACEMENT Left 06/24/2014   Procedure: CYSTOSCOPY WITH RETROGRADE PYELOGRAM,  AND STENT PLACEMENT;  Surgeon: Magdalene Molly, MD;  Location[facility];  Service: Urology;  Laterality:  Left;   CYSTOSCOPY WITH RETROGRADE PYELOGRAM, URETEROSCOPY AND STENT PLACEMENT Left 07/05/2014   Procedure: CYSTO/LEFT URETEROSCOPY/LEFT RETROGRADE PYELOGRAM/LEFT STENT PLACEMENT;  Surgeon: Jerilee Field, MD;  Location: [facility];  Service: Urology;  Laterality: Left;   CYSTOSCOPY WITH RETROGRADE  PYELOGRAM, URETEROSCOPY AND STENT PLACEMENT Left 07/06/2015   Procedure: CYSTOSCOPY WITH RETROGRADE PYELOGRAM, URETEROSCOPY , LASER, STENT PLACEMENT and BASKET EXTRACTION;  Surgeon: Jerilee Field, MD;  [facility];  Service: Urology;  Laterality: Left;   CYSTOSCOPY WITH RETROGRADE PYELOGRAM, URETEROSCOPY AND STENT PLACEMENT Left 08/19/2015   Procedure: CYSTOSCOPY WITH RETROGRADE PYELOGRAM, URETEROSCOPY AND STENT PLACEMENT;  Surgeon: Jerilee Field, MD;  [facility];  Service: Urology;  Laterality: Left;   CYSTOSCOPY WITH RETROGRADE PYELOGRAM, URETEROSCOPY AND STENT PLACEMENT Left 03/13/2018   Procedure: CYSTOSCOPY WITH RETROGRADE PYELOGRAM, URETEROSCOPY AND LEFT STENT PLACEMENT;  Surgeon: Bjorn Pippin, MD;  [facility];  Service: Urology;  Laterality: Left;   CYSTOSCOPY WITH RETROGRADE PYELOGRAM, URETEROSCOPY AND STENT PLACEMENT Left 03/11/2021   Procedure: CYSTOSCOPY WITH RETROGRADE PYELOGRAM, URETEROSCOPY AND STENT PLACEMENT,STONE EXTRACTION,HOLMIUM LASER;  Surgeon: Marcine Matar, MD;  [facility];  Service: Urology;  Laterality: Left;   CYSTOSCOPY WITH RETROGRADE PYELOGRAM, URETEROSCOPY AND STENT PLACEMENT Bilateral 05/06/2021   Procedure: CYSTOSCOPY WITH RETROGRADE PYELOGRAM, URETEROSCOPY AND STENT REPLACEMENT;  Surgeon: Sebastian Ache, MD;  [facility];  Service: Urology;  Laterality: Bilateral;  90 MINS   CYSTOSCOPY WITH RETROGRADE PYELOGRAM, URETEROSCOPY AND STENT PLACEMENT Bilateral 05/19/2022   Procedure: CYSTOSCOPY WITH RETROGRADE PYELOGRAM, URETEROSCOPY AND STENT  EXCHANGE;  Surgeon: Sebastian Ache, MD;  Location: [facility];  Service: Urology;  Laterality: Bilateral;  90 MINS   CYSTOSCOPY WITH RETROGRADE PYELOGRAM, URETEROSCOPY AND STENT PLACEMENT Bilateral 06/09/2022   Procedure: CYSTOSCOPY WITH RETROGRADE PYELOGRAM, URETEROSCOPY AND STENT  EXCHANGE;  Surgeon: Sebastian Ache, MD;  [facility];  Service: Urology;  Laterality: Bilateral;   75 MINS   CYSTOSCOPY WITH URETEROSCOPY AND STENT PLACEMENT Left 01/16/2016   Procedure: CYSTOSCOPY WITH LEFT RETROGRADE PYELOGRAM  LEFT DIGITAL URETEROSCOPY AND PLACEMENT LEFT URETERAL STENT;  Surgeon: Jerilee Field, MD;  [facility];  Service: Urology;  Laterality: Left;   CYSTOSCOPY WITH URETEROSCOPY, STONE BASKETRY AND STENT PLACEMENT Left 02/13/2016   Procedure: CYSTOSCOPY WITH LEFT URETEROSCOPY, HOLMIUM LASER AND STENT PLACEMENT;  Surgeon: Jerilee Field, MD;  Location[facility];  Service: Urology;  Laterality: Left;   CYSTOSCOPY/RETROGRADE/URETEROSCOPY/STONE EXTRACTION WITH BASKET Left 08/08/2015   Procedure: CYSTOSCOPY/RETROGRADE/URETEROSCOPY/STONE EXTRACTION WITH BASKET;  Surgeon: Jerilee Field, MD;  Location[facility];  Service: Urology;  Laterality: Left;   CYSTOSCOPY/URETEROSCOPY/HOLMIUM LASER/STENT PLACEMENT Left 07/30/2016   Procedure: CYSTOSCOPY/URETEROSCOPY/HOLMIUM LASER/STENT PLACEMENT;  Surgeon: Jerilee Field, MD;  Location: [facility];  Service: Urology;  Laterality: Left;   CYSTOSCOPY/URETEROSCOPY/HOLMIUM LASER/STENT PLACEMENT Left 08/19/2017   Procedure: CYSTOSCOPY/URETEROSCOPY/HOLMIUM LASER/STENT PLACEMENT;  Surgeon: Jerilee Field, MD;  Location: [facility];  Service: Urology;  Laterality: Left;   CYSTOSCOPY/URETEROSCOPY/HOLMIUM LASER/STENT PLACEMENT N/A 10/13/2018   Procedure: CYSTOSCOPY/RETROGRADE RIGHT URETEROSCOPY/ AND RIGHTSTENT PLACEMENT;  Surgeon: Bjorn Pippin, MD;  Location[facility];  Service: Urology;  Laterality: N/A;   EXTRACORPOREAL SHOCK WAVE LITHOTRIPSY Left 07-07-2015  &  12-26-2014   FOOT CAPSULE RELEASE W/ PERCUTANEOUS HEEL CORD LENGTHENING, TIBIAL TENDON TRANSFER Left 1994   clubfoot   HOLMIUM LASER APPLICATION Left 07/05/2014   Procedure: LASER LITHO;  Surgeon: Jerilee Field, MD;  Location: [facility];  Service: Urology;  Laterality: Left;   HOLMIUM LASER APPLICATION Left  08/08/2015  Procedure: HOLMIUM LASER  WITH LITHOTRIPSY ;  Surgeon: Jerilee Field, MD;  Location: [facility];  Service: Urology;  Laterality: Left;   HOLMIUM LASER APPLICATION Left 02/21/2017   Procedure: HOLMIUM LASER APPLICATION;  Surgeon: Bjorn Pippin, MD;  [facility];  Service: Urology;  Laterality: Left;   HOLMIUM LASER APPLICATION Left 03/13/2018   Procedure: HOLMIUM LASER APPLICATION;  Surgeon: Bjorn Pippin, MD;  [facility];  Service: Urology;  Laterality: Left;   HOLMIUM LASER APPLICATION Bilateral 05/06/2021   Procedure: HOLMIUM LASER APPLICATION;  Surgeon: Sebastian Ache, MD;  Location[facility];  Service: Urology;  Laterality: Bilateral;   HOLMIUM LASER APPLICATION Bilateral 05/19/2022   Procedure: HOLMIUM LASER APPLICATION;  Surgeon: Sebastian Ache, MD;  Location: [facility];  Service: Urology;  Laterality: Bilateral;   HOLMIUM LASER APPLICATION Bilateral 06/09/2022   Procedure: HOLMIUM LASER APPLICATION;  Surgeon: Sebastian Ache, MD;  Location[facility];  Service: Urology;  Laterality: Bilateral;   lumpectomy in neck for cat scratch fever      surgery for club foot at few days old   LYMPH GLAND EXCISION  2003   neck--  benign   SVT ABLATION N/A 05/22/2018   Procedure: SVT ABLATION;  Surgeon: Marinus Maw, MD;  Location: [facility];  Service: Cardiovascular;  Laterality: N/A;   SVT ABLATION     2019    URETEROSCOPY WITH HOLMIUM LASER LITHOTRIPSY Left 08/04/2021   Procedure: URETEROSCOPY WITH basket removal of stones/ STENT exchange/retrograde;  Surgeon: Jerilee Field, MD;  Location: [facility];  Service: Urology;  Laterality: Left;  ONLY NEEDS 60 MIN     A IV Location/Drains/Wounds Patient Lines/Drains/Airways Status     Active Line/Drains/Airways     Name Placement date Placement time Site Days   Peripheral IV 07/27/23 20 G 1" Anterior;Right Forearm 07/27/23  0413  Forearm  less than 1   Ureteral  Drain/Stent Left ureter 6 Fr. 08/04/21  0830  Left ureter  722   Ureteral Drain/Stent Left ureter 6 Fr. 11/04/21  --  Left ureter  630   Ureteral Drain/Stent Left ureter 5 Fr. 06/09/22  1652  Left ureter  413   Ureteral Drain/Stent Right ureter 5 Fr. 06/09/22  1654  Right ureter  413   Incision (Closed) 05/19/22 Penis 05/19/22  1501  -- 434   Incision (Closed) 06/09/22 Penis 06/09/22  1659  -- 413            Intake/Output Last 24 hours No intake or output data in the 24 hours ending 07/27/23 0926  Labs/Imaging Results for orders placed or performed during the hospital encounter of 07/27/23 (from the past 48 hour(s))  CBC with Differential     Status: None   Collection Time: 07/27/23  3:59 AM  Result Value Ref Range   WBC 7.5 4.0 - 10.5 K/uL   RBC 5.28 4.22 - 5.81 MIL/uL   Hemoglobin 14.9 13.0 - 17.0 g/dL   HCT 38.7 56.4 - 33.2 %   MCV 84.5 80.0 - 100.0 fL   MCH 28.2 26.0 - 34.0 pg   MCHC 33.4 30.0 - 36.0 g/dL   RDW 95.1 88.4 - 16.6 %   Platelets 203 150 - 400 K/uL   nRBC 0.0 0.0 - 0.2 %   Neutrophils Relative % 49 %   Neutro Abs 3.7 1.7 - 7.7 K/uL   Lymphocytes Relative 42 %   Lymphs Abs 3.2 0.7 - 4.0 K/uL   Monocytes Relative  8 %   Monocytes Absolute 0.6 0.1 - 1.0 K/uL   Eosinophils Relative 0 %   Eosinophils Absolute 0.0 0.0 - 0.5 K/uL   Basophils Relative 1 %   Basophils Absolute 0.0 0.0 - 0.1 K/uL   Immature Granulocytes 0 %   Abs Immature Granulocytes 0.02 0.00 - 0.07 K/uL    Comment: Performed at [facility], [address], [place], [place] [address]  Basic metabolic panel     Status: Abnormal   Collection Time: 07/27/23  3:59 AM  Result Value Ref Range   Sodium 136 135 - 145 mmol/L   Potassium 3.4 (L) 3.5 - 5.1 mmol/L   Chloride 105 98 - 111 mmol/L   CO2 23 22 - 32 mmol/L   Glucose, Bld 108 (H) 70 - 99 mg/dL    Comment: Glucose reference range applies only to samples taken after fasting for at least 8 hours.   BUN 14 6 - 20 mg/dL    Creatinine, Ser 6.04 0.61 - 1.24 mg/dL   Calcium 9.3 8.9 - 54.0 mg/dL   GFR, Estimated >98 >11 mL/min    Comment: (NOTE) Calculated using the CKD-EPI Creatinine Equation (2021)    Anion gap 8 5 - 15    Comment: Performed at [facility], [address] [place], [place] [address]  Urinalysis, w/ Reflex to Culture (Infection Suspected) -Urine, Clean Catch     Status: Abnormal   Collection Time: 07/27/23  4:18 AM  Result Value Ref Range   Specimen Source URINE, CLEAN CATCH    Color, Urine YELLOW YELLOW   APPearance HAZY (A) CLEAR   Specific Gravity, Urine 1.013 1.005 - 1.030   pH 6.0 5.0 - 8.0   Glucose, UA NEGATIVE NEGATIVE mg/dL   Hgb urine dipstick SMALL (A) NEGATIVE   Bilirubin Urine NEGATIVE NEGATIVE   Ketones, ur NEGATIVE NEGATIVE mg/dL   Protein, ur 30 (A) NEGATIVE mg/dL   Nitrite NEGATIVE NEGATIVE   Leukocytes,Ua MODERATE (A) NEGATIVE   RBC / HPF 21-50 0 - 5 RBC/hpf   WBC, UA 21-50 0 - 5 WBC/hpf    Comment:        Reflex urine culture not performed if WBC <=10, OR if Squamous epithelial cells >5. If Squamous epithelial cells >5 suggest recollection.    Bacteria, UA RARE (A) NONE SEEN   Squamous Epithelial / HPF 0-5 0 - 5 /HPF   Mucus PRESENT    Hyaline Casts, UA PRESENT     Comment: Performed at [facility], [address], [place], [place] [address]   DG Abd 1 View  Result Date: 07/27/2023 CLINICAL DATA:  32 year old male with obstructing proximal left ureteral calculus. EXAM: ABDOMEN - 1 VIEW COMPARISON:  CT Abdomen and Pelvis 0438 hours today. FINDINGS: The left ureteral calculus is visible radiographically projecting to the left of the apex of the levoconvex lumbar spine curvature (arrow). This appears to be the L3-L4 level, although there is evidence of transitional lumbosacral anatomy. Negative bowel gas pattern. No acute osseous abnormality identified. Chronic left hemipelvis phlebolith. IMPRESSION: Radiographically visible  obstructing left ureteral calculus, projecting to the left of the mid lumbar spine. Electronically Signed   By: Odessa Fleming M.D.   On: 07/27/2023 06:41   CT Renal Stone Study  Result Date: 07/27/2023 CLINICAL DATA:  31 year old male with history of left-sided flank pain and nausea. History of kidney stones. EXAM: CT ABDOMEN AND PELVIS WITHOUT CONTRAST TECHNIQUE: Multidetector CT imaging of the abdomen  and pelvis was performed following the standard protocol without IV contrast. RADIATION DOSE REDUCTION: This exam was performed according to the departmental dose-optimization program which includes automated exposure control, adjustment of the mA and/or kV according to patient size and/or use of iterative reconstruction technique. COMPARISON:  CT of the abdomen and pelvis 05/04/2022. FINDINGS: Lower chest: Unremarkable. Hepatobiliary: Diffuse low attenuation throughout the hepatic parenchyma, indicative of a background of hepatic steatosis. No discrete cystic or solid hepatic lesions are confidently identified on today's noncontrast CT examination. Unenhanced appearance of the gallbladder is unremarkable. Pancreas: No definite pancreatic mass or peripancreatic fluid collections or inflammatory changes are noted on today's noncontrast CT examination. Spleen: Unremarkable. Adrenals/Urinary Tract: Multiple nonobstructive calculi are noted within the collecting systems of both kidneys, largest of which is in the upper pole collecting system of the left kidney measuring up to 8 mm. In addition, in the proximal third of the left ureter (coronal image 80 of series 5) there is a 6 mm calculus which is associated with mild proximal left hydroureteronephrosis. No additional calculi are noted elsewhere along the course of either ureter or within the lumen of the urinary bladder. No right hydroureteronephrosis. Urinary bladder is nearly completely decompressed, but otherwise unremarkable in appearance. Bilateral adrenal glands are  normal in appearance. Stomach/Bowel: Unenhanced appearance of the stomach is normal. No pathologic dilatation of small bowel or colon. 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 Normal appendix. Vascular/Lymphatic: No atherosclerotic calcifications are noted in the abdominal aorta or pelvic vasculature. No lymphadenopathy noted in the abdomen or pelvis. Reproductive: Prostate gland and seminal vesicles are unremarkable in appearance. Other: No significant volume of ascites.  No pneumoperitoneum. Musculoskeletal: There are no aggressive appearing lytic or blastic lesions noted in the visualized portions of the skeleton. IMPRESSION: 1. 6 mm obstructing calculus in the proximal third of the left ureter with mild proximal left hydroureteronephrosis. 2. Multiple additional nonobstructive calculi in the collecting systems of both kidneys measuring up to 8 mm in the upper pole collecting system of left kidney. 3. Hepatic steatosis. 4. 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 Electronically Signed   By: Trudie Reed M.D.   On: 07/27/2023 05:37    Pending Labs Unresulted Labs (From admission, onward)     Start     Ordered   07/27/23 0418  Urine Culture  Once,   R        07/27/23 0418            Vitals/Pain Today's Vitals   07/27/23 0621 07/27/23 0830 07/27/23 0837 07/27/23 0911  BP: 130/83 (!) 125/91    Pulse: 74 71    Resp: 16   17  Temp:      TempSrc:      SpO2: 99% 98%    Weight:      Height:      PainSc:   8  5     Isolation Precautions No active isolations  Medications Medications  HYDROmorphone (DILAUDID) injection 1 mg (1 mg Intravenous Given 07/27/23 0413)  ondansetron (ZOFRAN) injection 4 mg (4 mg Intravenous Given 07/27/23 0414)  HYDROmorphone (DILAUDID) injection 1 mg (1 mg Intravenous Given 07/27/23 0503)  ketorolac (TORADOL) 30 MG/ML injection 30 mg (30 mg  Intravenous Given 07/27/23 0503)  metoCLOPramide (REGLAN) injection 10 mg (10 mg Intravenous Given 07/27/23 0503)  HYDROmorphone (DILAUDID) injection 1 mg (1 mg Intravenous Given 07/27/23 0549)  HYDROmorphone (DILAUDID) injection 1 mg (  1 mg Intravenous Given 07/27/23 9147)    Mobility walks     Focused Assessments Neuro Assessment Handoff:  Swallow screen pass?  N/A         Neuro Assessment: Within Defined Limits Neuro Checks:      Has TPA been given? No If patient is a Neuro Trauma and patient is going to OR before floor call report to 4N Charge nurse: [phone] or [phone]   R Recommendations: See Admitting Provider Note  Report given to:   Additional Notes:

## 2023-07-27 NOTE — Anesthesia Preprocedure Evaluation (Addendum)
Anesthesia Evaluation  Patient identified by MRN, date of birth, ID band Patient awake    Reviewed: Allergy & Precautions, NPO status , Patient's Chart, lab work & pertinent test results  History of Anesthesia Complications (+) PONV and history of anesthetic complications  Airway Mallampati: III  TM Distance: >3 FB Neck ROM: Full    Dental no notable dental hx.    Pulmonary Patient abstained from smoking., former smoker   Pulmonary exam normal        Cardiovascular negative cardio ROS Normal cardiovascular exam     Neuro/Psych  Headaches PSYCHIATRIC DISORDERS Anxiety        GI/Hepatic negative GI ROS, Neg liver ROS,,,  Endo/Other  negative endocrine ROS    Renal/GU Renal disease  negative genitourinary   Musculoskeletal negative musculoskeletal ROS (+)    Abdominal  (+) + obese  Peds  Hematology negative hematology ROS (+)   Anesthesia Other Findings BILATERAL RESIDUAL STONES  Reproductive/Obstetrics                             Anesthesia Physical Anesthesia Plan  ASA: 2  Anesthesia Plan: General   Post-op Pain Management:    Induction: Intravenous  PONV Risk Score and Plan: 2 and Ondansetron, Dexamethasone, Midazolam and Treatment may vary due to age or medical condition  Airway Management Planned: LMA  Additional Equipment: None  Intra-op Plan:   Post-operative Plan: Extubation in OR  Informed Consent: I have reviewed the patients History and Physical, chart, labs and discussed the procedure including the risks, benefits and alternatives for the proposed anesthesia with the patient or authorized representative who has indicated his/her understanding and acceptance.     Dental advisory given  Plan Discussed with: CRNA  Anesthesia Plan Comments:         Anesthesia Quick Evaluation

## 2023-07-27 NOTE — Transfer of Care (Signed)
Immediate Anesthesia Transfer of Care Note  Patient: Thomas Howell  Procedure(s) Performed: CYSTOSCOPY/URETEROSCOPY/HOLMIUM LASER/STENT PLACEMENT (Left)  Patient Location: PACU  Anesthesia Type:General  Level of Consciousness: drowsy and patient cooperative  Airway & Oxygen Therapy: Patient Spontanous Breathing and Patient connected to nasal cannula oxygen  Post-op Assessment: Report given to RN and Post -op Vital signs reviewed and stable  Post vital signs: Reviewed and stable  Last Vitals:  Vitals Value Taken Time  BP 114/80 07/27/23 1816  Temp    Pulse 80 07/27/23 1818  Resp 11 07/27/23 1818  SpO2 100 % 07/27/23 1818  Vitals shown include unfiled device data.  Last Pain:  Vitals:   07/27/23 1635  TempSrc: Axillary  PainSc:       Patients Stated Pain Goal: 4 (07/27/23 1632)  Complications: No notable events documented.

## 2023-07-27 NOTE — ED Provider Notes (Addendum)
[place] EMERGENCY DEPARTMENT AT [facility] Provider Note   CSN: [id number] Arrival date & time: 07/27/23  0344     History  Chief Complaint  Patient presents with   Flank Pain    Thomas Howell is a 32 y.o. male.  The history is provided by the patient and medical records.  Flank Pain   32 y.o. M with hx of recurrent kidney stones, presenting to the ED with left flank pain.  Woke up around 2am feeling the urge to urinate, unable to go and began having severe left flank pain.  Some nausea/vomiting associated.  No fever/chills.  Longstanding hx of kidney stones, has had upwards of 30+ procedures in the past.  Most recent was August 2023 with basket retrieval and stenting.  Home Medications Prior to Admission medications   Medication Sig Start Date End Date Taking? Authorizing Provider  ketorolac (TORADOL) 10 MG tablet Take 1 tablet (10 mg total) by mouth every 8 (eight) hours as needed. 06/09/22   Loletta Parish., MD  naloxone [facility] nasal spray 4 mg/0.1 mL Place 1 spray into the nose once.    [provider]  oxybutynin (DITROPAN) 5 MG tablet Take 1 tablet (5 mg total) by mouth every 8 (eight) hours as needed for bladder spasms (or stent discomfort post-operatively). Patient taking differently: Take 5 mg by mouth 2 (two) times daily as needed for bladder spasms (or stent discomfort post-operatively). 05/04/22   Loletta Parish., MD  Oxycodone HCl 10 MG TABS Take 10 mg by mouth 3 (three) times daily. 05/25/22   [provider]  Oxycodone HCl 10 MG TABS Take one tablet by mouth 3 times a day as needed for pain control. 06/10/22     OXYCONTIN 10 MG 12 hr tablet Take 10 mg by mouth at bedtime. 05/25/22   [provider]  promethazine (PHENERGAN) 25 MG tablet Take 1 tablet (25 mg total) by mouth every 8 (eight) hours as needed for up to 20 doses for nausea or vomiting. 01/27/22   Gloris Manchester, MD  rizatriptan (MAXALT-MLT) 10 MG  disintegrating tablet DISSOLVE 1 TABLET(10 MG) ON THE TONGUE DAILY AS NEEDED FOR MIGRAINE. MAY REPEAT IN 2 HOURS AS NEEDED. MAX 2 PER 24 HOURS Patient taking differently: Take 10 mg by mouth daily as needed for migraine. 05/27/22   Campbell Riches, NP  tamsulosin (FLOMAX) 0.4 MG CAPS capsule Take 0.4 mg by mouth daily.    [provider]      Allergies    Bactrim [sulfamethoxazole-trimethoprim] and Ketamine    Review of Systems   Review of Systems  Genitourinary:  Positive for flank pain.  All other systems reviewed and are negative.   Physical Exam Updated Vital Signs BP (!) 162/95   Pulse (!) 107   Temp 98.1 F (36.7 C) (Oral)   Resp 16   Ht 5\' 7"  (1.702 m)   Wt 99.8 kg   SpO2 100%   BMI 34.46 kg/m   Physical Exam Vitals and nursing note reviewed.  Constitutional:      Appearance: He is well-developed.     Comments: Appears uncomfortable  HENT:     Head: Normocephalic and atraumatic.  Eyes:     Conjunctiva/sclera: Conjunctivae normal.     Pupils: Pupils are equal, round, and reactive to light.  Cardiovascular:     Rate and Rhythm: Normal rate and regular rhythm.     Heart sounds: Normal heart sounds.  Pulmonary:  Effort: Pulmonary effort is normal.     Breath sounds: Normal breath sounds.  Abdominal:     General: Bowel sounds are normal.     Palpations: Abdomen is soft.     Tenderness: There is no guarding.     Hernia: No hernia is present.  Musculoskeletal:        General: Normal range of motion.     Cervical back: Normal range of motion.  Skin:    General: Skin is warm and dry.  Neurological:     Mental Status: He is alert and oriented to person, place, and time.     ED Results / Procedures / Treatments   Labs (all labs ordered are listed, but only abnormal results are displayed) Labs Reviewed  BASIC METABOLIC PANEL - Abnormal; Notable for the following components:      Result Value   Potassium 3.4 (*)    Glucose, Bld 108 (*)    All  other components within normal limits  URINALYSIS, W/ REFLEX TO CULTURE (INFECTION SUSPECTED) - Abnormal; Notable for the following components:   APPearance HAZY (*)    Hgb urine dipstick SMALL (*)    Protein, ur 30 (*)    Leukocytes,Ua MODERATE (*)    Bacteria, UA RARE (*)    All other components within normal limits  URINE CULTURE  CBC WITH DIFFERENTIAL/PLATELET    EKG None  Radiology CT Renal Stone Study  Result Date: 07/27/2023 CLINICAL DATA:  32 year old male with history of left-sided flank pain and nausea. History of kidney stones. EXAM: CT ABDOMEN AND PELVIS WITHOUT CONTRAST TECHNIQUE: Multidetector CT imaging of the abdomen and pelvis was performed following the standard protocol without IV contrast. RADIATION DOSE REDUCTION: This exam was performed according to the departmental dose-optimization program which includes automated exposure control, adjustment of the mA and/or kV according to patient size and/or use of iterative reconstruction technique. COMPARISON:  CT of the abdomen and pelvis 05/04/2022. FINDINGS: Lower chest: Unremarkable. Hepatobiliary: Diffuse low attenuation throughout the hepatic parenchyma, indicative of a background of hepatic steatosis. No discrete cystic or solid hepatic lesions are confidently identified on today's noncontrast CT examination. Unenhanced appearance of the gallbladder is unremarkable. Pancreas: No definite pancreatic mass or peripancreatic fluid collections or inflammatory changes are noted on today's noncontrast CT examination. Spleen: Unremarkable. Adrenals/Urinary Tract: Multiple nonobstructive calculi are noted within the collecting systems of both kidneys, largest of which is in the upper pole collecting system of the left kidney measuring up to 8 mm. In addition, in the proximal third of the left ureter (coronal image 80 of series 5) there is a 6 mm calculus which is associated with mild proximal left hydroureteronephrosis. No additional  calculi are noted elsewhere along the course of either ureter or within the lumen of the urinary bladder. No right hydroureteronephrosis. Urinary bladder is nearly completely decompressed, but otherwise unremarkable in appearance. Bilateral adrenal glands are normal in appearance. Stomach/Bowel: Unenhanced appearance of the stomach is normal. No pathologic dilatation of small bowel or colon. 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 Normal appendix. Vascular/Lymphatic: No atherosclerotic calcifications are noted in the abdominal aorta or pelvic vasculature. No lymphadenopathy noted in the abdomen or pelvis. Reproductive: Prostate gland and seminal vesicles are unremarkable in appearance. Other: No significant volume of ascites.  No pneumoperitoneum. Musculoskeletal: There are no aggressive appearing lytic or blastic lesions noted in the visualized portions of the skeleton. IMPRESSION: 1. 6 mm obstructing calculus in the proximal third of the left ureter  with mild proximal left hydroureteronephrosis. 2. Multiple additional nonobstructive calculi in the collecting systems of both kidneys measuring up to 8 mm in the upper pole collecting system of left kidney. 3. Hepatic steatosis. 4. 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 Electronically Signed   By: Trudie Reed M.D.   On: 07/27/2023 05:37    Procedures Procedures    Medications Ordered in ED Medications  HYDROmorphone (DILAUDID) injection 1 mg (1 mg Intravenous Given 07/27/23 0413)  ondansetron (ZOFRAN) injection 4 mg (4 mg Intravenous Given 07/27/23 0414)  HYDROmorphone (DILAUDID) injection 1 mg (1 mg Intravenous Given 07/27/23 0503)  ketorolac (TORADOL) 30 MG/ML injection 30 mg (30 mg Intravenous Given 07/27/23 0503)  metoCLOPramide (REGLAN) injection 10 mg (10 mg Intravenous Given 07/27/23 0503)  HYDROmorphone (DILAUDID) injection 1 mg  (1 mg Intravenous Given 07/27/23 0549)    ED Course/ Medical Decision Making/ A&P                                 Medical Decision Making Amount and/or Complexity of Data Reviewed Labs: ordered. Radiology: ordered and independent interpretation performed. ECG/medicine tests: ordered and independent interpretation performed.  Risk Prescription drug management.   32 y.o. M here with left flank pain.  Longstanding hx of kidney stones requiring intervention in the past.  Afebrile, non-toxic but does appear uncomfortable.  Had some vomiting PTA as well.  Plan for labs, CT stone study.  Dilaudid and zofran given.  Labs as above-- no leukocytosis.  Renal function WNL.  UA with moderate leuks, 21-50 WBC and RBC, rare bacteria.  Sent for culture.  CT with obstructive 6mm left proximal ureteral calculus, does have some hydroureteronephrosis.    Results discussed with patient.  He continues urinating without issue, pain has been controlled except when he gets up.  Discussed options of trial of spontaneous passage given size, however he is hesitant about this he states "normally they just put a stent in with my history".  Given additional dilaudid for pain control.  Discussed possible lidocaine infusion, however did not want this as it did not help in the past.  I have discussed case with on call urology, Dr. Margo Aye-- will evaluate in the ED this AM.  Care will be signed out to oncoming provider.  Disposition per urology recommendations.   Final Clinical Impression(s) / ED Diagnoses Final diagnoses:  Left ureteral stone    Rx / DC Orders ED Discharge Orders     None         Garlon Hatchet, PA-C 07/27/23 0554    Garlon Hatchet, PA-C 07/27/23 0615    Dione Booze, MD 07/27/23 [phone]

## 2023-07-27 NOTE — ED Provider Notes (Signed)
Accepted handoff at shift change from [employer]. Please see prior provider note for full HPI.  Briefly: Patient is a 32 y.o. male who presents to the ER for left flank pain. Hx of kidney stones. CT with obstructive 6 mm left proximal ureteral calculus. [place] concerning for possible infection, culture sent.   DDX/Plan: Dr Margo Aye with urology consulted overnight, will evaluate patient and give recommendations.    39 -- Dr Margo Aye evaluated patient and recommended surgical intervention. Plan to get patient to the OR around lunch time. Additional dose of pain medication given. Maintain NPO status. The patient appears reasonably stabilized for transfer to OR considering the current resources, flow, and capabilities available in the ED at this time, and I doubt any other [facility] requiring further screening and/or treatment in the ED prior to admission.   Su Monks, PA-C 07/27/23 4540    Gwyneth Sprout, MD 07/28/23 0710

## 2023-07-28 ENCOUNTER — Telehealth: Payer: Self-pay

## 2023-07-28 ENCOUNTER — Encounter (HOSPITAL_COMMUNITY): Payer: Self-pay | Admitting: Urology

## 2023-07-28 NOTE — Transitions of Care (Post Inpatient/ED Visit) (Signed)
   07/28/2023  Name: Thomas Howell [place] II MRN: [id number] DOB: 1991-06-28  Today's TOC FU Call Status: Today's TOC FU Call Status:: Unsuccessful Call (1st Attempt) Unsuccessful Call (1st Attempt) Date: 07/28/23  Attempted to reach the patient regarding the most recent Inpatient/ED visit.  Follow Up Plan: Additional outreach attempts will be made to reach the patient to complete the Transitions of Care (Post Inpatient/ED visit) call.   Signature Karena Addison, LPN [facility] Nurse Health Advisor Direct Dial [phone]

## 2023-07-29 ENCOUNTER — Encounter (HOSPITAL_COMMUNITY): Payer: Self-pay | Admitting: Urology

## 2023-08-01 NOTE — Transitions of Care (Post Inpatient/ED Visit) (Unsigned)
   08/01/2023  Name: Thomas Howell MRN: [id number] DOB: June 27, 1991  Today's TOC FU Call Status: Today's TOC FU Call Status:: Unsuccessful Call (2nd Attempt) Unsuccessful Call (1st Attempt) Date: 07/28/23 Unsuccessful Call (2nd Attempt) Date: 08/01/23  Attempted to reach the patient regarding the most recent Inpatient/ED visit.  Follow Up Plan: Additional outreach attempts will be made to reach the patient to complete the Transitions of Care (Post Inpatient/ED visit) call.   Signature Karena Addison, LPN [facility] Nurse Health Advisor Direct Dial [phone]

## 2023-08-15 NOTE — Anesthesia Postprocedure Evaluation (Signed)
Anesthesia Post Note  Patient: Thomas Howell  Procedure(s) Performed: CYSTOSCOPY/URETEROSCOPY/HOLMIUM LASER/STENT PLACEMENT (Left)     Patient location during evaluation: PACU Anesthesia Type: General Level of consciousness: awake Pain management: pain level controlled Vital Signs Assessment: post-procedure vital signs reviewed and stable Respiratory status: spontaneous breathing Cardiovascular status: stable Postop Assessment: no apparent nausea or vomiting Anesthetic complications: no  No notable events documented.  Last Vitals:  Vitals:   07/27/23 1915 07/27/23 1930  BP: (!) 128/90 (!) 139/91  Pulse: 77 80  Resp: 15 17  Temp: 36.7 C 36.7 C  SpO2: 99% 99%    Last Pain:  Vitals:   07/27/23 1930  TempSrc:   PainSc: 2                  Caren Macadam

## 2023-12-08 ENCOUNTER — Encounter (HOSPITAL_COMMUNITY): Payer: Self-pay

## 2023-12-08 ENCOUNTER — Inpatient Hospital Stay (HOSPITAL_COMMUNITY): Payer: BC Managed Care – PPO

## 2023-12-08 ENCOUNTER — Inpatient Hospital Stay (HOSPITAL_COMMUNITY)
Admission: EM | Admit: 2023-12-08 | Discharge: 2023-12-10 | DRG: 661 | Disposition: A | Discharge status: Home / Self Care | Payer: BC Managed Care – PPO | Attending: Internal Medicine | Admitting: Internal Medicine

## 2023-12-08 ENCOUNTER — Emergency Department (HOSPITAL_COMMUNITY): Payer: BC Managed Care – PPO

## 2023-12-08 ENCOUNTER — Other Ambulatory Visit: Payer: Self-pay

## 2023-12-08 DIAGNOSIS — K760 Fatty (change of) liver, not elsewhere classified: Secondary | ICD-10-CM | POA: Diagnosis present

## 2023-12-08 DIAGNOSIS — G43009 Migraine without aura, not intractable, without status migrainosus: Secondary | ICD-10-CM | POA: Diagnosis not present

## 2023-12-08 DIAGNOSIS — N390 Urinary tract infection, site not specified: Secondary | ICD-10-CM | POA: Diagnosis not present

## 2023-12-08 DIAGNOSIS — Z87442 Personal history of urinary calculi: Secondary | ICD-10-CM | POA: Diagnosis not present

## 2023-12-08 DIAGNOSIS — Z87891 Personal history of nicotine dependence: Secondary | ICD-10-CM

## 2023-12-08 DIAGNOSIS — Z881 Allergy status to other antibiotic agents status: Secondary | ICD-10-CM

## 2023-12-08 DIAGNOSIS — E039 Hypothyroidism, unspecified: Secondary | ICD-10-CM | POA: Diagnosis present

## 2023-12-08 DIAGNOSIS — N136 Pyonephrosis: Secondary | ICD-10-CM | POA: Diagnosis present

## 2023-12-08 DIAGNOSIS — Z79899 Other long term (current) drug therapy: Secondary | ICD-10-CM

## 2023-12-08 DIAGNOSIS — N132 Hydronephrosis with renal and ureteral calculous obstruction: Secondary | ICD-10-CM | POA: Diagnosis not present

## 2023-12-08 DIAGNOSIS — I471 Supraventricular tachycardia, unspecified: Secondary | ICD-10-CM | POA: Diagnosis not present

## 2023-12-08 DIAGNOSIS — Z8249 Family history of ischemic heart disease and other diseases of the circulatory system: Secondary | ICD-10-CM

## 2023-12-08 DIAGNOSIS — R109 Unspecified abdominal pain: Principal | ICD-10-CM

## 2023-12-08 DIAGNOSIS — R319 Hematuria, unspecified: Secondary | ICD-10-CM | POA: Diagnosis not present

## 2023-12-08 DIAGNOSIS — N201 Calculus of ureter: Secondary | ICD-10-CM | POA: Diagnosis present

## 2023-12-08 DIAGNOSIS — Z888 Allergy status to other drugs, medicaments and biological substances status: Secondary | ICD-10-CM

## 2023-12-08 DIAGNOSIS — N1330 Unspecified hydronephrosis: Secondary | ICD-10-CM

## 2023-12-08 DIAGNOSIS — R10A0 Flank pain, unspecified side: Secondary | ICD-10-CM

## 2023-12-08 LAB — CBC WITH DIFFERENTIAL/PLATELET
Abs Immature Granulocytes: 0.05 10*3/uL (ref 0.00–0.07)
Basophils Absolute: 0 10*3/uL (ref 0.0–0.1)
Basophils Relative: 0 %
Eosinophils Absolute: 0 10*3/uL (ref 0.0–0.5)
Eosinophils Relative: 0 %
HCT: 45.7 % (ref 39.0–52.0)
Hemoglobin: 15.4 g/dL (ref 13.0–17.0)
Immature Granulocytes: 0 %
Lymphocytes Relative: 15 %
Lymphs Abs: 2.1 10*3/uL (ref 0.7–4.0)
MCH: 28.3 pg (ref 26.0–34.0)
MCHC: 33.7 g/dL (ref 30.0–36.0)
MCV: 83.9 fL (ref 80.0–100.0)
Monocytes Absolute: 0.7 10*3/uL (ref 0.1–1.0)
Monocytes Relative: 5 %
Neutro Abs: 10.7 10*3/uL — ABNORMAL HIGH (ref 1.7–7.7)
Neutrophils Relative %: 80 %
Platelets: 242 10*3/uL (ref 150–400)
RBC: 5.45 MIL/uL (ref 4.22–5.81)
RDW: 14.3 % (ref 11.5–15.5)
WBC: 13.6 10*3/uL — ABNORMAL HIGH (ref 4.0–10.5)
nRBC: 0 % (ref 0.0–0.2)

## 2023-12-08 LAB — COMPREHENSIVE METABOLIC PANEL
ALT: 26 U/L (ref 0–44)
AST: 34 U/L (ref 15–41)
Albumin: 4.5 g/dL (ref 3.5–5.0)
Alkaline Phosphatase: 71 U/L (ref 38–126)
Anion gap: 13 (ref 5–15)
BUN: 17 mg/dL (ref 6–20)
CO2: 20 mmol/L — ABNORMAL LOW (ref 22–32)
Calcium: 9.6 mg/dL (ref 8.9–10.3)
Chloride: 104 mmol/L (ref 98–111)
Creatinine, Ser: 1.18 mg/dL (ref 0.61–1.24)
GFR, Estimated: >60 mL/min (ref >60)
Glucose, Bld: 109 mg/dL — ABNORMAL HIGH (ref 70–99)
Potassium: 3.7 mmol/L (ref 3.5–5.1)
Sodium: 137 mmol/L (ref 135–145)
Total Bilirubin: 0.6 mg/dL (ref <1.2)
Total Protein: 8.4 g/dL — ABNORMAL HIGH (ref 6.5–8.1)

## 2023-12-08 LAB — URINALYSIS, W/ REFLEX TO CULTURE (INFECTION SUSPECTED)
Bacteria, UA: NONE SEEN
Bilirubin Urine: NEGATIVE
Glucose, UA: NEGATIVE mg/dL
Ketones, ur: 5 mg/dL — AB
Nitrite: NEGATIVE
Protein, ur: 100 mg/dL — AB
Specific Gravity, Urine: 1.025 (ref 1.005–1.030)
WBC, UA: >50 WBC/hpf (ref 0–5)
pH: 5 (ref 5.0–8.0)

## 2023-12-08 LAB — LIPASE, BLOOD: Lipase: 23 U/L (ref 11–51)

## 2023-12-08 MED ORDER — TAMSULOSIN HCL 0.4 MG PO CAPS
0.4000 mg | ORAL_CAPSULE | Freq: Two times a day (BID) | ORAL | Status: DC
  Administered 2023-12-08 – 2023-12-10 (×4): 0.4 mg via ORAL
  Filled 2023-12-08 (×4): qty 1

## 2023-12-08 MED ORDER — CELECOXIB 200 MG PO CAPS
200.0000 mg | ORAL_CAPSULE | Freq: Two times a day (BID) | ORAL | Status: DC
  Administered 2023-12-08 – 2023-12-10 (×4): 200 mg via ORAL
  Filled 2023-12-08 (×5): qty 1

## 2023-12-08 MED ORDER — KCL-LACTATED RINGERS-D5W 20 MEQ/L IV SOLN
INTRAVENOUS | Status: AC
  Filled 2023-12-08 (×3): qty 1000

## 2023-12-08 MED ORDER — ENOXAPARIN SODIUM 60 MG/0.6ML IJ SOSY
50.0000 mg | PREFILLED_SYRINGE | INTRAMUSCULAR | Status: DC
  Administered 2023-12-09 – 2023-12-10 (×2): 50 mg via SUBCUTANEOUS
  Filled 2023-12-08 (×2): qty 0.6

## 2023-12-08 MED ORDER — ACETAMINOPHEN 325 MG PO TABS: 650.0000 mg | ORAL_TABLET | Freq: Four times a day (QID) | ORAL | Status: DC | PRN

## 2023-12-08 MED ORDER — ACETAMINOPHEN 325 MG PO TABS
650.0000 mg | ORAL_TABLET | ORAL | Status: DC
  Administered 2023-12-08 – 2023-12-10 (×9): 650 mg via ORAL
  Filled 2023-12-08 (×9): qty 2

## 2023-12-08 MED ORDER — ONDANSETRON HCL 4 MG/2ML IJ SOLN
4.0000 mg | Freq: Once | INTRAMUSCULAR | Status: AC
  Administered 2023-12-08: 4 mg via INTRAVENOUS
  Filled 2023-12-08: qty 2

## 2023-12-08 MED ORDER — SUMATRIPTAN SUCCINATE 50 MG PO TABS: 100.0000 mg | ORAL_TABLET | Freq: Every day | ORAL | Status: DC | PRN

## 2023-12-08 MED ORDER — ACETAMINOPHEN 650 MG RE SUPP: 650.0000 mg | Freq: Four times a day (QID) | RECTAL | Status: DC | PRN

## 2023-12-08 MED ORDER — HYDROMORPHONE HCL 1 MG/ML IJ SOLN
1.0000 mg | Freq: Once | INTRAMUSCULAR | Status: AC
  Administered 2023-12-08: 1 mg via INTRAVENOUS
  Filled 2023-12-08: qty 1

## 2023-12-08 MED ORDER — RIZATRIPTAN BENZOATE 10 MG PO TBDP: 10.0000 mg | ORAL_TABLET | Freq: Every day | ORAL | Status: DC | PRN

## 2023-12-08 MED ORDER — HYDROCHLOROTHIAZIDE 25 MG PO TABS
25.0000 mg | ORAL_TABLET | Freq: Two times a day (BID) | ORAL | Status: DC
  Administered 2023-12-08 – 2023-12-10 (×4): 25 mg via ORAL
  Filled 2023-12-08 (×4): qty 1

## 2023-12-08 MED ORDER — ONDANSETRON HCL 4 MG/2ML IJ SOLN: 4.0000 mg | Freq: Four times a day (QID) | INTRAMUSCULAR | Status: DC | PRN

## 2023-12-08 MED ORDER — SODIUM CHLORIDE 0.9 % IV BOLUS
1000.0000 mL | Freq: Once | INTRAVENOUS | Status: AC
  Administered 2023-12-08: 1000 mL via INTRAVENOUS

## 2023-12-08 MED ORDER — POLYETHYLENE GLYCOL 3350 17 G PO PACK: 17.0000 g | PACK | Freq: Every day | ORAL | Status: DC | PRN

## 2023-12-08 MED ORDER — HYDROMORPHONE HCL 1 MG/ML IJ SOLN
1.0000 mg | INTRAMUSCULAR | Status: DC | PRN
  Administered 2023-12-08 – 2023-12-10 (×10): 1 mg via INTRAVENOUS
  Filled 2023-12-08 (×10): qty 1

## 2023-12-08 MED ORDER — MORPHINE SULFATE (PF) 4 MG/ML IV SOLN
4.0000 mg | Freq: Once | INTRAVENOUS | Status: AC
  Administered 2023-12-08: 4 mg via INTRAVENOUS
  Filled 2023-12-08: qty 1

## 2023-12-08 MED ORDER — POTASSIUM CHLORIDE 20 MEQ PO PACK
60.0000 meq | PACK | Freq: Two times a day (BID) | ORAL | Status: DC
  Administered 2023-12-08 – 2023-12-09 (×2): 60 meq via ORAL
  Filled 2023-12-08 (×2): qty 3

## 2023-12-08 MED ORDER — SODIUM CHLORIDE 0.9% FLUSH
3.0000 mL | Freq: Two times a day (BID) | INTRAVENOUS | Status: DC
  Administered 2023-12-08 – 2023-12-10 (×3): 3 mL via INTRAVENOUS

## 2023-12-08 MED ORDER — SODIUM CHLORIDE 0.9 % IV SOLN
2.0000 g | INTRAVENOUS | Status: DC
  Administered 2023-12-08 – 2023-12-09 (×3): 2 g via INTRAVENOUS
  Filled 2023-12-08 (×3): qty 20

## 2023-12-08 MED ORDER — KETOROLAC TROMETHAMINE 30 MG/ML IJ SOLN
30.0000 mg | Freq: Once | INTRAMUSCULAR | Status: AC
  Administered 2023-12-08: 30 mg via INTRAVENOUS
  Filled 2023-12-08: qty 1

## 2023-12-08 MED ORDER — SENNOSIDES-DOCUSATE SODIUM 8.6-50 MG PO TABS
2.0000 | ORAL_TABLET | Freq: Two times a day (BID) | ORAL | Status: DC
  Administered 2023-12-08 – 2023-12-10 (×4): 2 via ORAL
  Filled 2023-12-08 (×4): qty 2

## 2023-12-08 MED ORDER — OXYCODONE HCL 5 MG PO TABS
5.0000 mg | ORAL_TABLET | ORAL | Status: DC | PRN
  Administered 2023-12-08 – 2023-12-10 (×3): 5 mg via ORAL
  Filled 2023-12-08 (×3): qty 1

## 2023-12-08 NOTE — Assessment & Plan Note (Signed)
C.w. rizatriptan prn

## 2023-12-08 NOTE — Assessment & Plan Note (Addendum)
Continue antibiotic therapy with ceftriaxone.  Wbc is elevated possible reactive  Urine culture with no growth.  Patient had 2 doses of IV ceftriaxone in the hospital.  Will take antibiotic (cephalexin) prior to cystoscopy as outpatient.

## 2023-12-08 NOTE — ED Provider Notes (Signed)
[place] EMERGENCY DEPARTMENT AT [facility] Provider Note   CSN: [id number] Arrival date & time: 12/08/23  1727    History  Chief Complaint  Patient presents with   Flank Pain    Antrone Zeh II is a 32 y.o. male hx of recurrent kidney stones here for left flank pain. Feels like prior stones. Passed a stone this AM. Now with recurrent pain, emesis. No fever, CP, SOB. Pain to lower abd. No change in BM. Follows with Urology. Multiple interventions for prior stones  HPI     Home Medications Prior to Admission medications   Medication Sig Start Date End Date Taking? Authorizing Provider  acetaminophen (TYLENOL) 500 MG tablet Take 500 mg by mouth every 6 (six) hours as needed for mild pain (pain score 1-3), moderate pain (pain score 4-6) or headache.   Yes [provider]  hydrochlorothiazide (HYDRODIURIL) 25 MG tablet Take 25 mg by mouth 2 (two) times daily.   Yes [provider]  MOTRIN IB 200 MG tablet Take 800 mg by mouth every 6 (six) hours as needed (for pain).   Yes [provider]  potassium chloride SA (KLOR-CON M) 20 MEQ tablet Take 40 mEq by mouth 2 (two) times daily.   Yes [provider]  rizatriptan (MAXALT-MLT) 10 MG disintegrating tablet DISSOLVE 1 TABLET(10 MG) ON THE TONGUE DAILY AS NEEDED FOR MIGRAINE. MAY REPEAT IN 2 HOURS AS NEEDED. MAX 2 PER 24 HOURS Patient taking differently: Take 10 mg by mouth daily as needed for migraine (dissolve on the tongue- may repeat once in 2 hours, if no relief (max of 2 tablets/24 hours)). 05/27/22  Yes Campbell Riches, NP  tamsulosin (FLOMAX) 0.4 MG CAPS capsule Take 1 capsule (0.4 mg total) by mouth at bedtime. Patient taking differently: Take 0.4 mg by mouth daily as needed (AS DIRECTED- if attempting to pass a kidney stone). 07/27/23  Yes Heloise Purpura, MD  phenazopyridine (PYRIDIUM) 200 MG tablet Take 1 tablet (200 mg total) by mouth 3 (three) times daily as  needed. Patient not taking: Reported on 12/08/2023 07/27/23   Heloise Purpura, MD  promethazine (PHENERGAN) 25 MG tablet Take 1 tablet (25 mg total) by mouth every 8 (eight) hours as needed for up to 20 doses for nausea or vomiting. Patient not taking: Reported on 12/08/2023 01/27/22   Gloris Manchester, MD  traMADol (ULTRAM) 50 MG tablet Take 1-2 tablets (50-100 mg total) by mouth every 6 (six) hours as needed (pain). Patient not taking: Reported on 12/08/2023 07/27/23   Heloise Purpura, MD      Allergies    Bactrim [sulfamethoxazole-trimethoprim] and Ketamine    Review of Systems   Review of Systems  Constitutional: Negative.   HENT: Negative.    Respiratory: Negative.    Cardiovascular: Negative.   Gastrointestinal:  Positive for abdominal pain, nausea and vomiting. Negative for abdominal distention, anal bleeding, blood in stool, constipation, diarrhea and rectal pain.  Genitourinary:  Positive for flank pain.  Skin: Negative.   Neurological: Negative.   All other systems reviewed and are negative.   Physical Exam Updated Vital Signs BP 134/88 (BP Location: Left Arm)   Pulse 70   Temp 98.6 F (37 C) (Oral)   Resp (!) 21   Ht 5\' 8"  (1.727 m)   Wt 104.3 kg   SpO2 97%   BMI 34.97 kg/m  Physical Exam Vitals and nursing note reviewed.  Constitutional:      General: He is not in  acute distress.    Appearance: He is well-developed. He is not ill-appearing, toxic-appearing or diaphoretic.     Comments: Active emesis  HENT:     Head: Normocephalic and atraumatic.     Nose: Nose normal.     Mouth/Throat:     Mouth: Mucous membranes are moist.  Eyes:     Pupils: Pupils are equal, round, and reactive to light.  Cardiovascular:     Rate and Rhythm: Normal rate and regular rhythm.     Pulses: Normal pulses.     Heart sounds: Normal heart sounds.  Pulmonary:     Effort: Pulmonary effort is normal. No respiratory distress.     Breath sounds: Normal breath sounds.  Abdominal:      General: Bowel sounds are normal. There is no distension.     Palpations: Abdomen is soft.     Tenderness: There is no abdominal tenderness. There is no guarding or rebound.  Musculoskeletal:        General: Normal range of motion.     Cervical back: Normal range of motion and neck supple.  Skin:    General: Skin is warm and dry.     Capillary Refill: Capillary refill takes less than 2 seconds.  Neurological:     General: No focal deficit present.     Mental Status: He is alert and oriented to person, place, and time.    ED Results / Procedures / Treatments   Labs (all labs ordered are listed, but only abnormal results are displayed) Labs Reviewed  CBC WITH DIFFERENTIAL/PLATELET - Abnormal; Notable for the following components:      Result Value   WBC 13.6 (*)    Neutro Abs 10.7 (*)    All other components within normal limits  COMPREHENSIVE METABOLIC PANEL - Abnormal; Notable for the following components:   CO2 20 (*)    Glucose, Bld 109 (*)    Total Protein 8.4 (*)    All other components within normal limits  URINALYSIS, W/ REFLEX TO CULTURE (INFECTION SUSPECTED) - Abnormal; Notable for the following components:   APPearance CLOUDY (*)    Hgb urine dipstick SMALL (*)    Ketones, ur 5 (*)    Protein, ur 100 (*)    Leukocytes,Ua SMALL (*)    All other components within normal limits  URINE CULTURE  LIPASE, BLOOD  CBC  CREATININE, SERUM  HIV ANTIBODY (ROUTINE TESTING W REFLEX)  APTT  PROTIME-INR  BASIC METABOLIC PANEL  CBC    EKG None  Radiology CT Renal Stone Study Result Date: 12/08/2023 CLINICAL DATA:  Abdominal and flank pain EXAM: CT ABDOMEN AND PELVIS WITHOUT CONTRAST TECHNIQUE: Multidetector CT imaging of the abdomen and pelvis was performed following the standard protocol without IV contrast. RADIATION DOSE REDUCTION: This exam was performed according to the departmental dose-optimization program which includes automated exposure control, adjustment of the  mA and/or kV according to patient size and/or use of iterative reconstruction technique. COMPARISON:  Renal stone CT 07/27/2023 FINDINGS: Lower chest: No acute abnormality. Hepatobiliary: There is diffuse fatty infiltration of the liver. Gallbladder and bile ducts are within normal limits. Pancreas: Unremarkable. No pancreatic ductal dilatation or surrounding inflammatory changes. Spleen: Normal in size without focal abnormality. Adrenals/Urinary Tract: There are 2 adjacent calculi in the mid left ureter measuring 3 mm. There is moderate left-sided hydronephrosis. There are additional punctate bilateral renal calculi measuring up to 5 mm. There is no right-sided hydronephrosis. The adrenal glands and bladder are within normal  limits. Stomach/Bowel: There is partial small-bowel malrotation with small bowel loops located in the right abdomen and colon located in the left abdomen. The appendix appears within normal limits in his located in the pelvis. There is no focal wall thickening or inflammation. The stomach is within normal limits. No bowel obstruction or free air. Vascular/Lymphatic: No significant vascular findings are present. No enlarged abdominal or pelvic lymph nodes. Reproductive: Prostate is unremarkable. Other: No abdominal wall hernia or abnormality. No abdominopelvic ascites. Musculoskeletal: There is levoconvex scoliosis of the lumbar spine. IMPRESSION: 1. There are 2 adjacent calculi in the mid left ureter measuring 3 mm with moderate left-sided hydronephrosis. 2. Additional bilateral nonobstructing renal calculi. 3. Fatty infiltration of the liver. 4. Congenital partial small-bowel malrotation. No bowel obstruction. Electronically Signed   By: Darliss Cheney M.D.   On: 12/08/2023 20:47    Procedures Procedures    Medications Ordered in ED Medications  cefTRIAXone (ROCEPHIN) 2 g in sodium chloride 0.9 % 100 mL IVPB (has no administration in time range)  dextrose 5% in lactated ringers with  KCl 20 mEq/L infusion (has no administration in time range)  HYDROmorphone (DILAUDID) injection 1 mg (has no administration in time range)  oxyCODONE (Oxy IR/ROXICODONE) immediate release tablet 5 mg (has no administration in time range)  acetaminophen (TYLENOL) tablet 650 mg (has no administration in time range)  celecoxib (CELEBREX) capsule 200 mg (has no administration in time range)  senna-docusate (Senokot-S) tablet 2 tablet (has no administration in time range)  tamsulosin (FLOMAX) capsule 0.4 mg (has no administration in time range)  ondansetron (ZOFRAN) injection 4 mg (has no administration in time range)  hydrochlorothiazide (HYDRODIURIL) tablet 25 mg (has no administration in time range)  potassium chloride (KLOR-CON) packet 60 mEq (has no administration in time range)  enoxaparin (LOVENOX) injection 50 mg (has no administration in time range)  acetaminophen (TYLENOL) tablet 650 mg (has no administration in time range)    Or  acetaminophen (TYLENOL) suppository 650 mg (has no administration in time range)  polyethylene glycol (MIRALAX / GLYCOLAX) packet 17 g (has no administration in time range)  sodium chloride flush (NS) 0.9 % injection 3 mL (has no administration in time range)  SUMAtriptan (IMITREX) tablet 100 mg (has no administration in time range)  sodium chloride 0.9 % bolus 1,000 mL (1,000 mLs Intravenous New Bag/Given 12/08/23 1752)  ondansetron (ZOFRAN) injection 4 mg (4 mg Intravenous Given 12/08/23 1750)  morphine (PF) 4 MG/ML injection 4 mg (4 mg Intravenous Given 12/08/23 1751)  ketorolac (TORADOL) 30 MG/ML injection 30 mg (30 mg Intravenous Given 12/08/23 1751)  HYDROmorphone (DILAUDID) injection 1 mg (1 mg Intravenous Given 12/08/23 1844)  ondansetron (ZOFRAN) injection 4 mg (4 mg Intravenous Given 12/08/23 1844)  HYDROmorphone (DILAUDID) injection 1 mg (1 mg Intravenous Given 12/08/23 2032)    ED Course/ Medical Decision Making/ A&P Clinical Course as of  12/08/23 2246  Thu Dec 08, 2023  1837 Reassessed. Rocking in bed with pain, dry heaving. Will give additional meds. Pending radiology read on ct scan  [BH]  2123 Dr. Dalbert Mayotte with Urology, can see in consult, med admit, NPO after midnight [BH]  2156 Dr. Maryjean Ka with medicine team will evaluate patient for admission [BH]    Clinical Course User Index [BH] Vyron Fronczak A, PA-C    32 year old with hx of recurrent kidney stones here for left flank pain and emesis which he feels is consistent with prior stones. Afebrile, non septic appearing. Active emesis in room.  Pain to left flank and LL abd. Plan on labs, imaging, pain control and reassess.  Labs and imaging personally viewed and interpreted:  CBC leukocytosis at 13.6 CMP without leukocytosis Lipase 23 UA small leuks, negative nitrate, bacteria negative, WBC greater than 50 CT stone  Patient reassessed.  Still having pain, emesis, Dilaudid, additional Zofran ordered  Patient reassessed.  Discussed still pending radiology read he does have significant hydronephrosis and when I view as a likely left stone as well as multiple nonobstructing renal stones.  He is still having pain, vomiting improved.  Additional Dilaudid ordered.  CONSULT with Dr. Dalbert Mayotte with Urology, rec medicine admit, will see in consult tomorrow, NPO after midnight  CONSULT with Dr. Maryjean Ka with medicine who will evaluate for admission.  The patient appears reasonably stabilized for admission considering the current resources, flow, and capabilities available in the ED at this time, and I doubt any other [facility] requiring further screening and/or treatment in the ED prior to admission.                                Medical Decision Making Amount and/or Complexity of Data Reviewed External Data Reviewed: labs, radiology and notes. Labs: ordered. Decision-making details documented in ED Course. Radiology: ordered and independent interpretation performed. Decision-making  details documented in ED Course.  Risk OTC drugs. Prescription drug management. Parenteral controlled substances. Decision regarding hospitalization. Diagnosis or treatment significantly limited by social determinants of health.          Final Clinical Impression(s) / ED Diagnoses Final diagnoses:  Flank pain  Ureteral stone  Intractable abdominal pain    Rx / DC Orders ED Discharge Orders     None         Destina Mantei A, PA-C 12/08/23 2246    Lonell Grandchild, MD 12/08/23 2247

## 2023-12-08 NOTE — Treatment Plan (Signed)
Patient is a 32 year old male with past medical history significant for nephrolithiasis who presents to the ED with ongoing left flank pain.  Workup in the ED showing 6 mm mid left ureteral stone with moderate hydronephrosis and known mild renal pelvis stone burden.  Reassuringly, the patient is hemodynamically stable.  He does have very mild white count, consistent with poor p.o. intake and nausea vomiting over the last couple of days.  His creatinine is 1.1, slightly increased above his baseline.  His urinalysis is not overtly concerning for infection, but does have significant amount of white blood cells. He has remained afebrile.  In the setting of poor pain control in the ED, will plan for admission to hospitalist team.  Patient should be n.p.o. after midnight, he should be given aggressive fluid control, started on Flomax, and pt should strain all his urine.  If medical expulsion therapy is unsuccessful, will consider ureteral stone extraction on 12/09/2023.  Roby Lofts, MD Resident [facility]

## 2023-12-08 NOTE — H&P (Addendum)
History and Physical    Patient: Thomas Howell ZOX:[id number] DOB: 1991/09/24 DOA: 12/08/2023 DOS: the patient was seen and examined on 12/08/2023 PCP: Tommie Sams, DO  Patient coming from: Home  Chief Complaint:  Chief Complaint  Patient presents with   Flank Pain   HPI: Thomas Howell is a 32 y.o. male with medical history significant of history of nephrolithiasis.  Calcium phosphate.  Patient chronically maintained on hydrochlorothiazide and potassium chloride for same  Patient was in his usual state of health till approximately noon today when he reports an abrupt onset of left flank pressure-like sensation severe associated with nausea occasional vomiting and dry heaving since then.  Patient also reports new onset of dysuria since then.  Patient denies any fevers or rigors or change in color of his urine.  Patient denies any diarrhea.  No other anterior pain or contralateral pain.  No radiation of pain no aggravating or relieving factor.  Patient came to the ER and has required several doses of morphine and Dilaudid and still uncontrolled pain.  Medical evaluation is sought.  CAT scan findings as noted below Review of Systems: As mentioned in the history of present illness. All other systems reviewed and are negative. Past Medical History:  Diagnosis Date   Complication of anesthesia    Dysrhythmia    hx of SVT ablation in 2019 - DR Lewayne Bunting dismissed by MD    Frequency-urgency syndrome    History of kidney stones    Hx of nausea and vomiting    d/t kidney stone   Hx of sepsis 08/11/2017   due to kidney stone/ hydronephrosis   Hypothyroidism    Left ureteral stone    Migraine    hx of migraines    Pain due to ureteral stent (HCC)    PONV (postoperative nausea and vomiting)    none recently   Scoliosis of lumbar spine 1994   treated at [facility] until age 59   Wears glasses    reading only   Past Surgical History:  Procedure Laterality Date    CYSTOSCOPY W/ RETROGRADES Right 07/06/2015   Procedure: CYSTOSCOPY WITH RETROGRADE PYELOGRAM;  Surgeon: Jerilee Field, MD;  Location: [facility];  Service: Urology;  Laterality: Right;   CYSTOSCOPY W/ URETERAL STENT PLACEMENT Left 08/08/2015   Procedure: CYSTOSCOPY WITH STENT REPLACEMENT;  Surgeon: Jerilee Field, MD;  Location: [facility];  Service: Urology;  Laterality: Left;   CYSTOSCOPY W/ URETERAL STENT PLACEMENT Left 02/21/2017   Procedure: CYSTOSCOPY WITH RETROGRADE PYELOGRAM/URETERAL LEFT STENT PLACEMENT WITH  LASER;  Surgeon: Bjorn Pippin, MD;  Location: [facility];  Service: Urology;  Laterality: Left;   CYSTOSCOPY W/ URETERAL STENT PLACEMENT Left 08/10/2017   Procedure: CYSTOSCOPY WITH RETROGRADE PYELOGRAM/URETERAL STENT PLACEMENT;  Surgeon: Heloise Purpura, MD;  Location: [facility];  Service: Urology;  Laterality: Left;   CYSTOSCOPY W/ URETERAL STENT PLACEMENT Bilateral 04/19/2021   Procedure: CYSTOSCOPY WITH RETROGRADE PYELOGRAM/URETERAL STENT PLACEMENT;  Surgeon: Sebastian Ache, MD;  Location: [facility];  Service: Urology;  Laterality: Bilateral;   CYSTOSCOPY W/ URETERAL STENT PLACEMENT Left 07/11/2021   Procedure: CYSTOSCOPY WITH RETROGRADE PYELOGRAM/URETEROSCOPY/URETERAL STENT PLACEMENT;  Surgeon: Marcine Matar, MD;  Location: [facility];  Service: Urology;  Laterality: Left;   CYSTOSCOPY W/ URETERAL STENT PLACEMENT Left 11/04/2021   Procedure: CYSTOSCOPY WITH RETROGRADE PYELOGRAM//URETEROSCOPY/STONE EXTRACTION/URETERAL STENT PLACEMENT;  Surgeon: Marcine Matar, MD;  Location: [facility];  Service: Urology;  Laterality: Left;   CYSTOSCOPY W/ URETERAL STENT PLACEMENT Bilateral 05/04/2022  Procedure: CYSTOSCOPY WITH RETROGRADE PYELOGRAM/URETERAL STENT PLACEMENT;  Surgeon: Sebastian Ache, MD;  Location: [facility];  Service: Urology;  Laterality: Bilateral;   CYSTOSCOPY WITH RETROGRADE PYELOGRAM, URETEROSCOPY AND STENT PLACEMENT Left 06/24/2014   Procedure: CYSTOSCOPY WITH RETROGRADE  PYELOGRAM,  AND STENT PLACEMENT;  Surgeon: Magdalene Molly, MD;  Location: [facility];  Service: Urology;  Laterality: Left;   CYSTOSCOPY WITH RETROGRADE PYELOGRAM, URETEROSCOPY AND STENT PLACEMENT Left 07/05/2014   Procedure: CYSTO/LEFT URETEROSCOPY/LEFT RETROGRADE PYELOGRAM/LEFT STENT PLACEMENT;  Surgeon: Jerilee Field, MD;  Location: [facility];  Service: Urology;  Laterality: Left;   CYSTOSCOPY WITH RETROGRADE PYELOGRAM, URETEROSCOPY AND STENT PLACEMENT Left 07/06/2015   Procedure: CYSTOSCOPY WITH RETROGRADE PYELOGRAM, URETEROSCOPY , LASER, STENT PLACEMENT and BASKET EXTRACTION;  Surgeon: Jerilee Field, MD;  Location: [facility];  Service: Urology;  Laterality: Left;   CYSTOSCOPY WITH RETROGRADE PYELOGRAM, URETEROSCOPY AND STENT PLACEMENT Left 08/19/2015   Procedure: CYSTOSCOPY WITH RETROGRADE PYELOGRAM, URETEROSCOPY AND STENT PLACEMENT;  Surgeon: Jerilee Field, MD;  Location: [facility];  Service: Urology;  Laterality: Left;   CYSTOSCOPY WITH RETROGRADE PYELOGRAM, URETEROSCOPY AND STENT PLACEMENT Left 03/13/2018   Procedure: CYSTOSCOPY WITH RETROGRADE PYELOGRAM, URETEROSCOPY AND LEFT STENT PLACEMENT;  Surgeon: Bjorn Pippin, MD;  Location[facility];  Service: Urology;  Laterality: Left;   CYSTOSCOPY WITH RETROGRADE PYELOGRAM, URETEROSCOPY AND STENT PLACEMENT Left 03/11/2021   Procedure: CYSTOSCOPY WITH RETROGRADE PYELOGRAM, URETEROSCOPY AND STENT PLACEMENT,STONE EXTRACTION,HOLMIUM LASER;  Surgeon: Marcine Matar, MD;  Location: [facility];  Service: Urology;  Laterality: Left;   CYSTOSCOPY WITH RETROGRADE PYELOGRAM, URETEROSCOPY AND STENT PLACEMENT Bilateral 05/06/2021   Procedure: CYSTOSCOPY WITH RETROGRADE PYELOGRAM, URETEROSCOPY AND STENT REPLACEMENT;  Surgeon: Sebastian Ache, MD;  Location: [facility];  Service: Urology;  Laterality: Bilateral;  90 MINS   CYSTOSCOPY WITH RETROGRADE PYELOGRAM, URETEROSCOPY AND STENT PLACEMENT Bilateral 05/19/2022   Procedure: CYSTOSCOPY WITH RETROGRADE  PYELOGRAM, URETEROSCOPY AND STENT  EXCHANGE;  Surgeon: Sebastian Ache, MD;  Location: [facility];  Service: Urology;  Laterality: Bilateral;  90 MINS   CYSTOSCOPY WITH RETROGRADE PYELOGRAM, URETEROSCOPY AND STENT PLACEMENT Bilateral 06/09/2022   Procedure: CYSTOSCOPY WITH RETROGRADE PYELOGRAM, URETEROSCOPY AND STENT  EXCHANGE;  Surgeon: Sebastian Ache, MD;  Location: [facility];  Service: Urology;  Laterality: Bilateral;  75 MINS   CYSTOSCOPY WITH URETEROSCOPY AND STENT PLACEMENT Left 01/16/2016   Procedure: CYSTOSCOPY WITH LEFT RETROGRADE PYELOGRAM  LEFT DIGITAL URETEROSCOPY AND PLACEMENT LEFT URETERAL STENT;  Surgeon: Jerilee Field, MD;  Location: [facility];  Service: Urology;  Laterality: Left;   CYSTOSCOPY WITH URETEROSCOPY, STONE BASKETRY AND STENT PLACEMENT Left 02/13/2016   Procedure: CYSTOSCOPY WITH LEFT URETEROSCOPY, HOLMIUM LASER AND STENT PLACEMENT;  Surgeon: Jerilee Field, MD;  Location: [facility];  Service: Urology;  Laterality: Left;   CYSTOSCOPY/RETROGRADE/URETEROSCOPY/STONE EXTRACTION WITH BASKET Left 08/08/2015   Procedure: CYSTOSCOPY/RETROGRADE/URETEROSCOPY/STONE EXTRACTION WITH BASKET;  Surgeon: Jerilee Field, MD;  Location: [facility];  Service: Urology;  Laterality: Left;   CYSTOSCOPY/URETEROSCOPY/HOLMIUM LASER/STENT PLACEMENT Left 07/30/2016   Procedure: CYSTOSCOPY/URETEROSCOPY/HOLMIUM LASER/STENT PLACEMENT;  Surgeon: Jerilee Field, MD;  Location: [facility];  Service: Urology;  Laterality: Left;   CYSTOSCOPY/URETEROSCOPY/HOLMIUM LASER/STENT PLACEMENT Left 08/19/2017   Procedure: CYSTOSCOPY/URETEROSCOPY/HOLMIUM LASER/STENT PLACEMENT;  Surgeon: Jerilee Field, MD;  Location: [facility];  Service: Urology;  Laterality: Left;   CYSTOSCOPY/URETEROSCOPY/HOLMIUM LASER/STENT PLACEMENT N/A 10/13/2018   Procedure: CYSTOSCOPY/RETROGRADE RIGHT URETEROSCOPY/ AND RIGHTSTENT PLACEMENT;  Surgeon: Bjorn Pippin, MD;  [facility];  Service: Urology;  Laterality: N/A;   CYSTOSCOPY/URETEROSCOPY/HOLMIUM LASER/STENT PLACEMENT Left 07/27/2023  Procedure: CYSTOSCOPY/URETEROSCOPY/HOLMIUM LASER/STENT PLACEMENT;  Surgeon: Heloise Purpura, MD;  Location: [facility];  Service: Urology;  Laterality: Left;   EXTRACORPOREAL SHOCK WAVE LITHOTRIPSY Left 07-07-2015  &  12-26-2014   FOOT CAPSULE RELEASE W/ PERCUTANEOUS HEEL CORD LENGTHENING, TIBIAL TENDON TRANSFER Left 1994   clubfoot   HOLMIUM LASER APPLICATION Left 07/05/2014   Procedure: LASER LITHO;  Surgeon: Jerilee Field, MD;  Location: [facility];  Service: Urology;  Laterality: Left;   HOLMIUM LASER APPLICATION Left 08/08/2015   Procedure: HOLMIUM LASER  WITH LITHOTRIPSY ;  Surgeon: Jerilee Field, MD;  Location: [facility];  Service: Urology;  Laterality: Left;   HOLMIUM LASER APPLICATION Left 02/21/2017   Procedure: HOLMIUM LASER APPLICATION;  Surgeon: Bjorn Pippin, MD;  Location[facility];  Service: Urology;  Laterality: Left;   HOLMIUM LASER APPLICATION Left 03/13/2018   Procedure: HOLMIUM LASER APPLICATION;  Surgeon: Bjorn Pippin, MD;  Location: [facility];  Service: Urology;  Laterality: Left;   HOLMIUM LASER APPLICATION Bilateral 05/06/2021   Procedure: HOLMIUM LASER APPLICATION;  Surgeon: Sebastian Ache, MD;  Location: [facility];  Service: Urology;  Laterality: Bilateral;   HOLMIUM LASER APPLICATION Bilateral 05/19/2022   Procedure: HOLMIUM LASER APPLICATION;  Surgeon: Sebastian Ache, MD;  Location: [facility];  Service: Urology;  Laterality: Bilateral;   HOLMIUM LASER APPLICATION Bilateral 06/09/2022   Procedure: HOLMIUM LASER APPLICATION;  Surgeon: Sebastian Ache, MD;  Location: [facility];  Service: Urology;  Laterality: Bilateral;   lumpectomy in neck for cat scratch fever      surgery for club foot at few days old   LYMPH GLAND EXCISION  2003   neck--  benign   SVT ABLATION N/A 05/22/2018   Procedure: SVT ABLATION;  Surgeon:  Marinus Maw, MD;  Location: [facility];  Service: Cardiovascular;  Laterality: N/A;   SVT ABLATION     2019    URETEROSCOPY WITH HOLMIUM LASER LITHOTRIPSY Left 08/04/2021   Procedure: URETEROSCOPY WITH basket removal of stones/ STENT exchange/retrograde;  Surgeon: Jerilee Field, MD;  Location: [facility];  Service: Urology;  Laterality: Left;  ONLY NEEDS 60 MIN   Social History:  reports that he quit smoking about 9 years ago. His smoking use included cigarettes. He started smoking about 11 years ago. He has never used smokeless tobacco. He reports that he does not currently use alcohol. He reports that he does not use drugs.  Allergies  Allergen Reactions   Bactrim [Sulfamethoxazole-Trimethoprim] Nausea And Vomiting   Ketamine Other (See Comments)    Did not like the sensation    Family History  Problem Relation Age of Onset   Cancer Father    Kidney Stones Mother    Cancer Other    Hypertension Other    Hyperlipidemia Other    Stroke Other    Kidney Stones Brother     Prior to Admission medications   Medication Sig Start Date End Date Taking? Authorizing Provider  acetaminophen (TYLENOL) 500 MG tablet Take 500 mg by mouth every 6 (six) hours as needed for moderate pain.    [provider]  phenazopyridine (PYRIDIUM) 200 MG tablet Take 1 tablet (200 mg total) by mouth 3 (three) times daily as needed. 07/27/23   Heloise Purpura, MD  promethazine (PHENERGAN) 25 MG tablet Take 1 tablet (25 mg total) by mouth every 8 (eight) hours as needed for up to 20 doses for nausea or vomiting. 01/27/22   Gloris Manchester, MD  rizatriptan (MAXALT-MLT)  10 MG disintegrating tablet DISSOLVE 1 TABLET(10 MG) ON THE TONGUE DAILY AS NEEDED FOR MIGRAINE. MAY REPEAT IN 2 HOURS AS NEEDED. MAX 2 PER 24 HOURS Patient taking differently: Take 10 mg by mouth daily as needed for migraine. 05/27/22   Campbell Riches, NP  tamsulosin (FLOMAX) 0.4 MG CAPS capsule Take 0.4 mg by mouth  daily as needed (kidney stones).    [provider]  tamsulosin (FLOMAX) 0.4 MG CAPS capsule Take 1 capsule (0.4 mg total) by mouth at bedtime. 07/27/23   Heloise Purpura, MD  traMADol (ULTRAM) 50 MG tablet Take 1-2 tablets (50-100 mg total) by mouth every 6 (six) hours as needed (pain). 07/27/23   Heloise Purpura, MD    Physical Exam: Vitals:   12/08/23 1735 12/08/23 2011 12/08/23 2043  BP: (!) 152/104  134/88  Pulse: 93  70  Resp: 20  (!) 21  Temp:  98.6 F (37 C) 98.6 F (37 C)  TempSrc:  Oral Oral  SpO2: 96%  97%  Weight: 104.3 kg    Height: 5\' 8"  (1.727 m)     General: Patient is alert and awake, appears to be in slight painful distress Respiratory exam: Bilateral intravesicular Cardiovascular exam S1-S2 normal Abdomen all quadrants soft nontender Left flank tenderness to percussion Genital examination not done Extremities warm without edema Data Reviewed:  Labs on Admission:  Results for orders placed or performed during the hospital encounter of 12/08/23 (from the past 24 hours)  CBC with Differential     Status: Abnormal   Collection Time: 12/08/23  5:50 PM  Result Value Ref Range   WBC 13.6 (H) 4.0 - 10.5 K/uL   RBC 5.45 4.22 - 5.81 MIL/uL   Hemoglobin 15.4 13.0 - 17.0 g/dL   HCT 82.9 56.2 - 13.0 %   MCV 83.9 80.0 - 100.0 fL   MCH 28.3 26.0 - 34.0 pg   MCHC 33.7 30.0 - 36.0 g/dL   RDW 86.5 78.4 - 69.6 %   Platelets 242 150 - 400 K/uL   nRBC 0.0 0.0 - 0.2 %   Neutrophils Relative % 80 %   Neutro Abs 10.7 (H) 1.7 - 7.7 K/uL   Lymphocytes Relative 15 %   Lymphs Abs 2.1 0.7 - 4.0 K/uL   Monocytes Relative 5 %   Monocytes Absolute 0.7 0.1 - 1.0 K/uL   Eosinophils Relative 0 %   Eosinophils Absolute 0.0 0.0 - 0.5 K/uL   Basophils Relative 0 %   Basophils Absolute 0.0 0.0 - 0.1 K/uL   Immature Granulocytes 0 %   Abs Immature Granulocytes 0.05 0.00 - 0.07 K/uL  Comprehensive metabolic panel     Status: Abnormal   Collection Time: 12/08/23  5:50 PM  Result  Value Ref Range   Sodium 137 135 - 145 mmol/L   Potassium 3.7 3.5 - 5.1 mmol/L   Chloride 104 98 - 111 mmol/L   CO2 20 (L) 22 - 32 mmol/L   Glucose, Bld 109 (H) 70 - 99 mg/dL   BUN 17 6 - 20 mg/dL   Creatinine, Ser 2.95 0.61 - 1.24 mg/dL   Calcium 9.6 8.9 - 28.4 mg/dL   Total Protein 8.4 (H) 6.5 - 8.1 g/dL   Albumin 4.5 3.5 - 5.0 g/dL   AST 34 15 - 41 U/L   ALT 26 0 - 44 U/L   Alkaline Phosphatase 71 38 - 126 U/L   Total Bilirubin 0.6 <1.2 mg/dL   GFR, Estimated >13 >24 mL/min  Anion gap 13 5 - 15  Lipase, blood     Status: None   Collection Time: 12/08/23  5:50 PM  Result Value Ref Range   Lipase 23 11 - 51 U/L  Urinalysis, w/ Reflex to Culture (Infection Suspected) -Urine, Clean Catch     Status: Abnormal   Collection Time: 12/08/23  7:09 PM  Result Value Ref Range   Specimen Source URINE, CLEAN CATCH    Color, Urine YELLOW YELLOW   APPearance CLOUDY (A) CLEAR   Specific Gravity, Urine 1.025 1.005 - 1.030   pH 5.0 5.0 - 8.0   Glucose, UA NEGATIVE NEGATIVE mg/dL   Hgb urine dipstick SMALL (A) NEGATIVE   Bilirubin Urine NEGATIVE NEGATIVE   Ketones, ur 5 (A) NEGATIVE mg/dL   Protein, ur 811 (A) NEGATIVE mg/dL   Nitrite NEGATIVE NEGATIVE   Leukocytes,Ua SMALL (A) NEGATIVE   RBC / HPF 6-10 0 - 5 RBC/hpf   WBC, UA >50 0 - 5 WBC/hpf   Bacteria, UA NONE SEEN NONE SEEN   Squamous Epithelial / HPF 0-5 0 - 5 /HPF   Mucus PRESENT    Basic Metabolic Panel: Recent Labs  Lab 12/08/23 1750  NA 137  K 3.7  CL 104  CO2 20*  GLUCOSE 109*  BUN 17  CREATININE 1.18  CALCIUM 9.6   Liver Function Tests: Recent Labs  Lab 12/08/23 1750  AST 34  ALT 26  ALKPHOS 71  BILITOT 0.6  PROT 8.4*  ALBUMIN 4.5   Recent Labs  Lab 12/08/23 1750  LIPASE 23   No results for input(s): "AMMONIA" in the last 168 hours. CBC: Recent Labs  Lab 12/08/23 1750  WBC 13.6*  NEUTROABS 10.7*  HGB 15.4  HCT 45.7  MCV 83.9  PLT 242   Cardiac Enzymes: No results for input(s): "CKTOTAL",  "CKMB", "CKMBINDEX", "TROPONINIHS" in the last 168 hours.  BNP (last 3 results) No results for input(s): "PROBNP" in the last 8760 hours. CBG: No results for input(s): "GLUCAP" in the last 168 hours.  Radiological Exams on Admission:  CT Renal Stone Study Result Date: 12/08/2023 CLINICAL DATA:  Abdominal and flank pain EXAM: CT ABDOMEN AND PELVIS WITHOUT CONTRAST TECHNIQUE: Multidetector CT imaging of the abdomen and pelvis was performed following the standard protocol without IV contrast. RADIATION DOSE REDUCTION: This exam was performed according to the departmental dose-optimization program which includes automated exposure control, adjustment of the mA and/or kV according to patient size and/or use of iterative reconstruction technique. COMPARISON:  Renal stone CT 07/27/2023 FINDINGS: Lower chest: No acute abnormality. Hepatobiliary: There is diffuse fatty infiltration of the liver. Gallbladder and bile ducts are within normal limits. Pancreas: Unremarkable. No pancreatic ductal dilatation or surrounding inflammatory changes. Spleen: Normal in size without focal abnormality. Adrenals/Urinary Tract: There are 2 adjacent calculi in the mid left ureter measuring 3 mm. There is moderate left-sided hydronephrosis. There are additional punctate bilateral renal calculi measuring up to 5 mm. There is no right-sided hydronephrosis. The adrenal glands and bladder are within normal limits. Stomach/Bowel: There is partial small-bowel malrotation with small bowel loops located in the right abdomen and colon located in the left abdomen. The appendix appears within normal limits in his located in the pelvis. There is no focal wall thickening or inflammation. The stomach is within normal limits. No bowel obstruction or free air. Vascular/Lymphatic: No significant vascular findings are present. No enlarged abdominal or pelvic lymph nodes. Reproductive: Prostate is unremarkable. Other: No abdominal wall hernia or  abnormality.  No abdominopelvic ascites. Musculoskeletal: There is levoconvex scoliosis of the lumbar spine. IMPRESSION: 1. There are 2 adjacent calculi in the mid left ureter measuring 3 mm with moderate left-sided hydronephrosis. 2. Additional bilateral nonobstructing renal calculi. 3. Fatty infiltration of the liver. 4. Congenital partial small-bowel malrotation. No bowel obstruction. Electronically Signed   By: Darliss Cheney M.D.   On: 12/08/2023 20:47       Assessment and Plan: * Hydronephrosis  on the left side felt to be due to mid left ureter calculi that are subcentimeter 3 mm each.  2 in number.  At this time treat with tamsulosin, IV fluids, multimodal pain therapy ordered for standing acetaminophen standing Celebrex, as needed oxycodone and Dilaudid.  Bowel regimen ordered.  Urology has been consulted by ER provider, they will evaluate the patient in the morning.  No finding of pyelonephritis on CAT scan.  I will order a US Renal in AM to follow up on this to see if this is resolved.  Patient has had prior known chronic nephrolithiasis.  Patient is maintained on hydrochlorothiazide and potassium chloride orally for his calcium phosphate stones.  This is as per history from the patient.    UTI (urinary tract infection) Patient complaining of dysuria, has high leukocytosis and also noted to have leukocytes in the urine.  Will follow-up urine culture as ordered by ER provider.  Treat with ceftriaxone.  I do not see benefit of doing blood cultures given no fever at this time and no rigors patient looking nontoxic.  SVT (supraventricular tachycardia) (HCC) S/p ablation in 2018  Migraine headache without aura C.w. rizatriptan prn   Please review med rec after pharmacy input is obtained.   Advance Care Planning:   Code Status: Prior full code  Consults: urology called by ER attending.  Family Communication: per patient.  Severity of Illness: The appropriate patient status for  this patient is INPATIENT. Inpatient status is judged to be reasonable and necessary in order to provide the required intensity of service to ensure the patient's safety. The patient's presenting symptoms, physical exam findings, and initial radiographic and laboratory data in the context of their chronic comorbidities is felt to place them at high risk for further clinical deterioration. Furthermore, it is not anticipated that the patient will be medically stable for discharge from the hospital within 2 midnights of admission.   * I certify that at the point of admission it is my clinical judgment that the patient will require inpatient hospital care spanning beyond 2 midnights from the point of admission due to high intensity of service, high risk for further deterioration and high frequency of surveillance required.*  Author: Nolberto Hanlon, MD 12/08/2023 10:03 PM  For on call review [email or web address].

## 2023-12-08 NOTE — Assessment & Plan Note (Addendum)
S/p ablation in 2018 Stable on this admission.

## 2023-12-08 NOTE — ED Triage Notes (Signed)
Patient has had left sided flank pain since 12pm. Vomited multiple times. History of needing surgeries.

## 2023-12-09 ENCOUNTER — Inpatient Hospital Stay (HOSPITAL_COMMUNITY): Payer: BC Managed Care – PPO | Admitting: Certified Registered Nurse Anesthetist

## 2023-12-09 ENCOUNTER — Inpatient Hospital Stay (HOSPITAL_COMMUNITY): Payer: BC Managed Care – PPO

## 2023-12-09 ENCOUNTER — Encounter (HOSPITAL_COMMUNITY): Payer: Self-pay | Admitting: Internal Medicine

## 2023-12-09 ENCOUNTER — Ambulatory Visit: Payer: Self-pay | Admitting: Urology

## 2023-12-09 ENCOUNTER — Encounter (HOSPITAL_COMMUNITY)
Admission: EM | Disposition: A | Discharge status: Home / Self Care | Payer: Self-pay | Source: Home / Self Care | Attending: Internal Medicine

## 2023-12-09 DIAGNOSIS — N390 Urinary tract infection, site not specified: Secondary | ICD-10-CM

## 2023-12-09 DIAGNOSIS — N201 Calculus of ureter: Secondary | ICD-10-CM

## 2023-12-09 DIAGNOSIS — G43009 Migraine without aura, not intractable, without status migrainosus: Secondary | ICD-10-CM | POA: Diagnosis not present

## 2023-12-09 DIAGNOSIS — I471 Supraventricular tachycardia, unspecified: Secondary | ICD-10-CM | POA: Diagnosis not present

## 2023-12-09 DIAGNOSIS — N132 Hydronephrosis with renal and ureteral calculous obstruction: Secondary | ICD-10-CM | POA: Diagnosis not present

## 2023-12-09 DIAGNOSIS — R319 Hematuria, unspecified: Secondary | ICD-10-CM

## 2023-12-09 HISTORY — PX: CYSTOSCOPY W/ URETERAL STENT PLACEMENT: SHX1429

## 2023-12-09 LAB — BASIC METABOLIC PANEL
Anion gap: 7 (ref 5–15)
BUN: 16 mg/dL (ref 6–20)
CO2: 26 mmol/L (ref 22–32)
Calcium: 8.5 mg/dL — ABNORMAL LOW (ref 8.9–10.3)
Chloride: 102 mmol/L (ref 98–111)
Creatinine, Ser: 1.21 mg/dL (ref 0.61–1.24)
GFR, Estimated: >60 mL/min (ref >60)
Glucose, Bld: 124 mg/dL — ABNORMAL HIGH (ref 70–99)
Potassium: 3.7 mmol/L (ref 3.5–5.1)
Sodium: 135 mmol/L (ref 135–145)

## 2023-12-09 LAB — URINE CULTURE: Culture: NO GROWTH

## 2023-12-09 LAB — CBC
HCT: 44.4 % (ref 39.0–52.0)
Hemoglobin: 14.2 g/dL (ref 13.0–17.0)
MCH: 27.8 pg (ref 26.0–34.0)
MCHC: 32 g/dL (ref 30.0–36.0)
MCV: 86.9 fL (ref 80.0–100.0)
Platelets: 197 10*3/uL (ref 150–400)
RBC: 5.11 MIL/uL (ref 4.22–5.81)
RDW: 14.4 % (ref 11.5–15.5)
WBC: 9.8 10*3/uL (ref 4.0–10.5)
nRBC: 0 % (ref 0.0–0.2)

## 2023-12-09 LAB — HIV ANTIBODY (ROUTINE TESTING W REFLEX): HIV Screen 4th Generation wRfx: NONREACTIVE

## 2023-12-09 LAB — PROTIME-INR
INR: 1.1 (ref 0.8–1.2)
Prothrombin Time: 13.9 s (ref 11.4–15.2)

## 2023-12-09 LAB — APTT: aPTT: 29 s (ref 24–36)

## 2023-12-09 SURGERY — CYSTOSCOPY, FLEXIBLE, WITH STENT REPLACEMENT
Anesthesia: General | Site: Ureter | Laterality: Left

## 2023-12-09 MED ORDER — FENTANYL CITRATE (PF) 100 MCG/2ML IJ SOLN
INTRAMUSCULAR | Status: DC | PRN
  Administered 2023-12-09 (×2): 25 ug via INTRAVENOUS
  Administered 2023-12-09: 50 ug via INTRAVENOUS
  Administered 2023-12-09 (×2): 25 ug via INTRAVENOUS
  Administered 2023-12-09: 50 ug via INTRAVENOUS

## 2023-12-09 MED ORDER — TRAMADOL HCL 50 MG PO TABS: 50.0000 mg | ORAL_TABLET | Freq: Four times a day (QID) | ORAL | 0 refills | Status: AC | PRN

## 2023-12-09 MED ORDER — KETOROLAC TROMETHAMINE 30 MG/ML IJ SOLN
INTRAMUSCULAR | Status: DC | PRN
  Administered 2023-12-09: 30 mg via INTRAVENOUS

## 2023-12-09 MED ORDER — FENTANYL CITRATE PF 50 MCG/ML IJ SOSY
PREFILLED_SYRINGE | INTRAMUSCULAR | Status: AC
  Filled 2023-12-09: qty 1

## 2023-12-09 MED ORDER — DEXAMETHASONE SODIUM PHOSPHATE 10 MG/ML IJ SOLN
INTRAMUSCULAR | Status: AC
  Filled 2023-12-09: qty 2

## 2023-12-09 MED ORDER — ONDANSETRON HCL 4 MG/2ML IJ SOLN
INTRAMUSCULAR | Status: DC | PRN
  Administered 2023-12-09: 4 mg via INTRAVENOUS

## 2023-12-09 MED ORDER — MIDAZOLAM HCL 2 MG/2ML IJ SOLN
INTRAMUSCULAR | Status: AC
  Filled 2023-12-09: qty 2

## 2023-12-09 MED ORDER — LACTATED RINGERS IV SOLN: INTRAVENOUS | Status: DC

## 2023-12-09 MED ORDER — MIDAZOLAM HCL 2 MG/2ML IJ SOLN
INTRAMUSCULAR | Status: DC | PRN
  Administered 2023-12-09: 2 mg via INTRAVENOUS

## 2023-12-09 MED ORDER — PROPOFOL 10 MG/ML IV BOLUS
INTRAVENOUS | Status: DC | PRN
  Administered 2023-12-09: 200 mg via INTRAVENOUS

## 2023-12-09 MED ORDER — HYDROMORPHONE HCL 1 MG/ML IJ SOLN
INTRAMUSCULAR | Status: AC
  Administered 2023-12-09: 0.5 mg via INTRAVENOUS
  Filled 2023-12-09: qty 1

## 2023-12-09 MED ORDER — IOHEXOL 300 MG/ML  SOLN
INTRAMUSCULAR | Status: DC | PRN
  Administered 2023-12-09: 8 mL

## 2023-12-09 MED ORDER — CEPHALEXIN 500 MG PO CAPS: 500.0000 mg | ORAL_CAPSULE | Freq: Two times a day (BID) | ORAL | 0 refills | Status: AC

## 2023-12-09 MED ORDER — DEXMEDETOMIDINE HCL IN NACL 80 MCG/20ML IV SOLN
INTRAVENOUS | Status: DC | PRN
  Administered 2023-12-09 (×3): 4 ug via INTRAVENOUS
  Administered 2023-12-09: 8 ug via INTRAVENOUS

## 2023-12-09 MED ORDER — KETOROLAC TROMETHAMINE 30 MG/ML IJ SOLN
INTRAMUSCULAR | Status: AC
  Filled 2023-12-09: qty 1

## 2023-12-09 MED ORDER — SODIUM CHLORIDE 0.9 % IR SOLN
Status: DC | PRN
  Administered 2023-12-09: 3000 mL

## 2023-12-09 MED ORDER — PANTOPRAZOLE SODIUM 40 MG PO TBEC
40.0000 mg | DELAYED_RELEASE_TABLET | Freq: Every day | ORAL | Status: DC
  Administered 2023-12-09 – 2023-12-10 (×2): 40 mg via ORAL
  Filled 2023-12-09 (×2): qty 1

## 2023-12-09 MED ORDER — CEFTRIAXONE SODIUM 1 G IJ SOLR: 1.0000 g | INTRAMUSCULAR | Status: DC

## 2023-12-09 MED ORDER — LIDOCAINE HCL (PF) 2 % IJ SOLN
INTRAMUSCULAR | Status: AC
  Filled 2023-12-09: qty 15

## 2023-12-09 MED ORDER — FENTANYL CITRATE (PF) 100 MCG/2ML IJ SOLN
INTRAMUSCULAR | Status: AC
  Filled 2023-12-09: qty 2

## 2023-12-09 MED ORDER — ORAL CARE MOUTH RINSE: 15.0000 mL | Freq: Once | OROMUCOSAL | Status: AC

## 2023-12-09 MED ORDER — OXYBUTYNIN CHLORIDE 5 MG PO TABS
5.0000 mg | ORAL_TABLET | Freq: Three times a day (TID) | ORAL | 1 refills | Status: AC | PRN
Start: 2023-12-09 — End: ?

## 2023-12-09 MED ORDER — FENTANYL CITRATE PF 50 MCG/ML IJ SOSY
25.0000 ug | PREFILLED_SYRINGE | INTRAMUSCULAR | Status: DC | PRN
  Administered 2023-12-09: 50 ug via INTRAVENOUS

## 2023-12-09 MED ORDER — PHENYLEPHRINE 80 MCG/ML (10ML) SYRINGE FOR IV PUSH (FOR BLOOD PRESSURE SUPPORT)
PREFILLED_SYRINGE | INTRAVENOUS | Status: AC
  Filled 2023-12-09: qty 10

## 2023-12-09 MED ORDER — DEXAMETHASONE SODIUM PHOSPHATE 10 MG/ML IJ SOLN
INTRAMUSCULAR | Status: DC | PRN
  Administered 2023-12-09: 10 mg via INTRAVENOUS

## 2023-12-09 MED ORDER — ONDANSETRON HCL 4 MG/2ML IJ SOLN
INTRAMUSCULAR | Status: AC
  Filled 2023-12-09: qty 2

## 2023-12-09 MED ORDER — CHLORHEXIDINE GLUCONATE 0.12 % MT SOLN
15.0000 mL | Freq: Once | OROMUCOSAL | Status: AC
  Administered 2023-12-09: 15 mL via OROMUCOSAL

## 2023-12-09 MED ORDER — HYDROMORPHONE HCL 1 MG/ML IJ SOLN
0.5000 mg | INTRAMUSCULAR | Status: AC | PRN
Start: 2023-12-09 — End: 2023-12-09
  Administered 2023-12-09 (×2): 0.5 mg via INTRAVENOUS

## 2023-12-09 MED ORDER — LIDOCAINE 2% (20 MG/ML) 5 ML SYRINGE
INTRAMUSCULAR | Status: DC | PRN
  Administered 2023-12-09: 80 mg via INTRAVENOUS

## 2023-12-09 SURGICAL SUPPLY — 16 items
BAG URO CATCHER STRL LF (MISCELLANEOUS) ×1 IMPLANT
CATH URETL OPEN END 6FR 70 (CATHETERS) IMPLANT
CLOTH BEACON ORANGE TIMEOUT ST (SAFETY) ×1 IMPLANT
EXTRACTOR STONE NITINOL NGAGE (UROLOGICAL SUPPLIES) IMPLANT
GLOVE SURG LX STRL 7.5 STRW (GLOVE) ×1 IMPLANT
GOWN STRL REUS W/ TWL XL LVL3 (GOWN DISPOSABLE) ×1 IMPLANT
GUIDEWIRE STR DUAL SENSOR (WIRE) ×1 IMPLANT
GUIDEWIRE ZIPWRE .038 STRAIGHT (WIRE) IMPLANT
KIT TURNOVER KIT A (KITS) IMPLANT
MANIFOLD NEPTUNE II (INSTRUMENTS) ×1 IMPLANT
PACK CYSTO (CUSTOM PROCEDURE TRAY) ×1 IMPLANT
SHEATH NAVIGATOR HD 11/13X36 (SHEATH) IMPLANT
STENT URET 6FRX26 CONTOUR (STENTS) IMPLANT
TRACTIP FLEXIVA PULSE ID 200 (Laser) IMPLANT
TUBING CONNECTING 10 (TUBING) ×1 IMPLANT
TUBING UROLOGY SET (TUBING) IMPLANT

## 2023-12-09 NOTE — Transfer of Care (Signed)
Immediate Anesthesia Transfer of Care Note  Patient: Thomas Howell  Procedure(s) Performed: Procedure(s): CYSTOSCOPY WITH URETEROSCOPY, RETROGRADE,STENT REPLACEMENT, HOLMIUM LASER (Left)  Patient Location: PACU  Anesthesia Type:General  Level of Consciousness:  sedated, patient cooperative and responds to stimulation  Airway & Oxygen Therapy:Patient Spontanous Breathing and Patient connected to face mask oxgen  Post-op Assessment:  Report given to PACU RN and Post -op Vital signs reviewed and stable  Post vital signs:  Reviewed and stable  Last Vitals:  Vitals:   12/09/23 1544 12/09/23 1825  BP: (!) 151/79 110/75  Pulse: (!) 110 76  Resp: 15 13  Temp: 36.5 C 36.6 C  SpO2: 99% 100%    Complications: No apparent anesthesia complications

## 2023-12-09 NOTE — ED Notes (Addendum)
Janelle from short stay called and informed RN that patient procedure is at 3:30 and they will be in route to get patient around 1:30-2:30

## 2023-12-09 NOTE — Consult Note (Signed)
Urology Consult   Physician requesting consult: Adolph Pollack, MD  Reason for consult: Left ureteral stones  History of Present Illness: Thomas Howell is a 32 y.o. male with a long history of kidney stones who presented to the emergency department on 12/08/2023 with a 24-hour history of worsening left-sided flank pain associated with nausea and vomiting.  CT stone study performed at that time revealed to, 3 mm left mid ureteral calculi with mild hydronephrosis.  Despite multiple doses of pain medications, the patient continues to have renal colic.   Past Medical History:  Diagnosis Date   Complication of anesthesia    Dysrhythmia    hx of SVT ablation in 2019 - DR Lewayne Bunting dismissed by MD    Frequency-urgency syndrome    History of kidney stones    Hx of nausea and vomiting    d/t kidney stone   Hx of sepsis 08/11/2017   due to kidney stone/ hydronephrosis   Hypothyroidism    Left ureteral stone    Migraine    hx of migraines    Pain due to ureteral stent (HCC)    PONV (postoperative nausea and vomiting)    none recently   Scoliosis of lumbar spine 1994   treated at [facility] until age 22   Wears glasses    reading only    Past Surgical History:  Procedure Laterality Date   CYSTOSCOPY W/ RETROGRADES Right 07/06/2015   Procedure: CYSTOSCOPY WITH RETROGRADE PYELOGRAM;  Surgeon: Jerilee Field, MD;  Location: [facility];  Service: Urology;  Laterality: Right;   CYSTOSCOPY W/ URETERAL STENT PLACEMENT Left 08/08/2015   Procedure: CYSTOSCOPY WITH STENT REPLACEMENT;  Surgeon: Jerilee Field, MD;  Location: [facility];  Service: Urology;  Laterality: Left;   CYSTOSCOPY W/ URETERAL STENT PLACEMENT Left 02/21/2017   Procedure: CYSTOSCOPY WITH RETROGRADE PYELOGRAM/URETERAL LEFT STENT PLACEMENT WITH  LASER;  Surgeon: Bjorn Pippin, MD;  Location: [facility];  Service: Urology;  Laterality: Left;   CYSTOSCOPY W/ URETERAL STENT PLACEMENT Left 08/10/2017   Procedure:  CYSTOSCOPY WITH RETROGRADE PYELOGRAM/URETERAL STENT PLACEMENT;  Surgeon: Heloise Purpura, MD;  Location: [facility];  Service: Urology;  Laterality: Left;   CYSTOSCOPY W/ URETERAL STENT PLACEMENT Bilateral 04/19/2021   Procedure: CYSTOSCOPY WITH RETROGRADE PYELOGRAM/URETERAL STENT PLACEMENT;  Surgeon: Sebastian Ache, MD;  Location: [facility];  Service: Urology;  Laterality: Bilateral;   CYSTOSCOPY W/ URETERAL STENT PLACEMENT Left 07/11/2021   Procedure: CYSTOSCOPY WITH RETROGRADE PYELOGRAM/URETEROSCOPY/URETERAL STENT PLACEMENT;  Surgeon: Marcine Matar, MD;  Location: [facility];  Service: Urology;  Laterality: Left;   CYSTOSCOPY W/ URETERAL STENT PLACEMENT Left 11/04/2021   Procedure: CYSTOSCOPY WITH RETROGRADE PYELOGRAM//URETEROSCOPY/STONE EXTRACTION/URETERAL STENT PLACEMENT;  Surgeon: Marcine Matar, MD;  Location: [facility];  Service: Urology;  Laterality: Left;   CYSTOSCOPY W/ URETERAL STENT PLACEMENT Bilateral 05/04/2022   Procedure: CYSTOSCOPY WITH RETROGRADE PYELOGRAM/URETERAL STENT PLACEMENT;  Surgeon: Sebastian Ache, MD;  Location: [facility];  Service: Urology;  Laterality: Bilateral;   CYSTOSCOPY WITH RETROGRADE PYELOGRAM, URETEROSCOPY AND STENT PLACEMENT Left 06/24/2014   Procedure: CYSTOSCOPY WITH RETROGRADE PYELOGRAM,  AND STENT PLACEMENT;  Surgeon: Magdalene Molly, MD;  Location: [facility];  Service: Urology;  Laterality: Left;   CYSTOSCOPY WITH RETROGRADE PYELOGRAM, URETEROSCOPY AND STENT PLACEMENT Left 07/05/2014   Procedure: CYSTO/LEFT URETEROSCOPY/LEFT RETROGRADE PYELOGRAM/LEFT STENT PLACEMENT;  Surgeon: Jerilee Field, MD;  Location: [facility];  Service: Urology;  Laterality: Left;   CYSTOSCOPY WITH RETROGRADE PYELOGRAM, URETEROSCOPY AND STENT PLACEMENT Left 07/06/2015   Procedure: CYSTOSCOPY WITH  RETROGRADE PYELOGRAM, URETEROSCOPY , LASER, STENT PLACEMENT and BASKET EXTRACTION;  Surgeon: Jerilee Field, MD;  Location: [facility];  Service: Urology;  Laterality: Left;    CYSTOSCOPY WITH RETROGRADE PYELOGRAM, URETEROSCOPY AND STENT PLACEMENT Left 08/19/2015   Procedure: CYSTOSCOPY WITH RETROGRADE PYELOGRAM, URETEROSCOPY AND STENT PLACEMENT;  Surgeon: Jerilee Field, MD;  Location[facility];  Service: Urology;  Laterality: Left;   CYSTOSCOPY WITH RETROGRADE PYELOGRAM, URETEROSCOPY AND STENT PLACEMENT Left 03/13/2018   Procedure: CYSTOSCOPY WITH RETROGRADE PYELOGRAM, URETEROSCOPY AND LEFT STENT PLACEMENT;  Surgeon: Bjorn Pippin, MD;  Location: [facility];  Service: Urology;  Laterality: Left;   CYSTOSCOPY WITH RETROGRADE PYELOGRAM, URETEROSCOPY AND STENT PLACEMENT Left 03/11/2021   Procedure: CYSTOSCOPY WITH RETROGRADE PYELOGRAM, URETEROSCOPY AND STENT PLACEMENT,STONE EXTRACTION,HOLMIUM LASER;  Surgeon: Marcine Matar, MD;  Location[facility];  Service: Urology;  Laterality: Left;   CYSTOSCOPY WITH RETROGRADE PYELOGRAM, URETEROSCOPY AND STENT PLACEMENT Bilateral 05/06/2021   Procedure: CYSTOSCOPY WITH RETROGRADE PYELOGRAM, URETEROSCOPY AND STENT REPLACEMENT;  Surgeon: Sebastian Ache, MD;  Location: [facility];  Service: Urology;  Laterality: Bilateral;  90 MINS   CYSTOSCOPY WITH RETROGRADE PYELOGRAM, URETEROSCOPY AND STENT PLACEMENT Bilateral 05/19/2022   Procedure: CYSTOSCOPY WITH RETROGRADE PYELOGRAM, URETEROSCOPY AND STENT  EXCHANGE;  Surgeon: Sebastian Ache, MD;  Location: [facility];  Service: Urology;  Laterality: Bilateral;  90 MINS   CYSTOSCOPY WITH RETROGRADE PYELOGRAM, URETEROSCOPY AND STENT PLACEMENT Bilateral 06/09/2022   Procedure: CYSTOSCOPY WITH RETROGRADE PYELOGRAM, URETEROSCOPY AND STENT  EXCHANGE;  Surgeon: Sebastian Ache, MD;  Location: [facility];  Service: Urology;  Laterality: Bilateral;  75 MINS   CYSTOSCOPY WITH URETEROSCOPY AND STENT PLACEMENT Left 01/16/2016   Procedure: CYSTOSCOPY WITH LEFT RETROGRADE PYELOGRAM  LEFT DIGITAL URETEROSCOPY AND PLACEMENT LEFT URETERAL STENT;  Surgeon: Jerilee Field, MD;  Location[facility];  Service: Urology;   Laterality: Left;   CYSTOSCOPY WITH URETEROSCOPY, STONE BASKETRY AND STENT PLACEMENT Left 02/13/2016   Procedure: CYSTOSCOPY WITH LEFT URETEROSCOPY, HOLMIUM LASER AND STENT PLACEMENT;  Surgeon: Jerilee Field, MD;  Location: [facility];  Service: Urology;  Laterality: Left;   CYSTOSCOPY/RETROGRADE/URETEROSCOPY/STONE EXTRACTION WITH BASKET Left 08/08/2015   Procedure: CYSTOSCOPY/RETROGRADE/URETEROSCOPY/STONE EXTRACTION WITH BASKET;  Surgeon: Jerilee Field, MD;  Location: [facility];  Service: Urology;  Laterality: Left;   CYSTOSCOPY/URETEROSCOPY/HOLMIUM LASER/STENT PLACEMENT Left 07/30/2016   Procedure: CYSTOSCOPY/URETEROSCOPY/HOLMIUM LASER/STENT PLACEMENT;  Surgeon: Jerilee Field, MD;  Location: [facility] [place];  Service: Urology;  Laterality: Left;   CYSTOSCOPY/URETEROSCOPY/HOLMIUM LASER/STENT PLACEMENT Left 08/19/2017   Procedure: CYSTOSCOPY/URETEROSCOPY/HOLMIUM LASER/STENT PLACEMENT;  Surgeon: Jerilee Field, MD;  Location: [facility];  Service: Urology;  Laterality: Left;   CYSTOSCOPY/URETEROSCOPY/HOLMIUM LASER/STENT PLACEMENT N/A 10/13/2018   Procedure: CYSTOSCOPY/RETROGRADE RIGHT URETEROSCOPY/ AND RIGHTSTENT PLACEMENT;  Surgeon: Bjorn Pippin, MD;  Location[facility];  Service: Urology;  Laterality: N/A;   CYSTOSCOPY/URETEROSCOPY/HOLMIUM LASER/STENT PLACEMENT Left 07/27/2023   Procedure: CYSTOSCOPY/URETEROSCOPY/HOLMIUM LASER/STENT PLACEMENT;  Surgeon: Heloise Purpura, MD;  Location: [facility];  Service: Urology;  Laterality: Left;   EXTRACORPOREAL SHOCK WAVE LITHOTRIPSY Left 07-07-2015  &  12-26-2014   FOOT CAPSULE RELEASE W/ PERCUTANEOUS HEEL CORD LENGTHENING, TIBIAL TENDON TRANSFER Left 1994   clubfoot   HOLMIUM LASER APPLICATION Left 07/05/2014   Procedure: LASER LITHO;  Surgeon: Jerilee Field, MD;  Location: [facility];  Service: Urology;  Laterality: Left;   HOLMIUM LASER APPLICATION Left 08/08/2015   Procedure: HOLMIUM LASER   WITH LITHOTRIPSY ;  Surgeon: Jerilee Field, MD;  Location: [facility];  Service: Urology;  Laterality: Left;   HOLMIUM LASER  APPLICATION Left 02/21/2017   Procedure: HOLMIUM LASER APPLICATION;  Surgeon: Bjorn Pippin, MD;  Location: [facility];  Service: Urology;  Laterality: Left;   HOLMIUM LASER APPLICATION Left 03/13/2018   Procedure: HOLMIUM LASER APPLICATION;  Surgeon: Bjorn Pippin, MD;  Location[facility];  Service: Urology;  Laterality: Left;   HOLMIUM LASER APPLICATION Bilateral 05/06/2021   Procedure: HOLMIUM LASER APPLICATION;  Surgeon: Sebastian Ache, MD;  Location: [facility];  Service: Urology;  Laterality: Bilateral;   HOLMIUM LASER APPLICATION Bilateral 05/19/2022   Procedure: HOLMIUM LASER APPLICATION;  Surgeon: Sebastian Ache, MD;  Location: [facility];  Service: Urology;  Laterality: Bilateral;   HOLMIUM LASER APPLICATION Bilateral 06/09/2022   Procedure: HOLMIUM LASER APPLICATION;  Surgeon: Sebastian Ache, MD;  Location: [facility];  Service: Urology;  Laterality: Bilateral;   lumpectomy in neck for cat scratch fever      surgery for club foot at few days old   LYMPH GLAND EXCISION  2003   neck--  benign   SVT ABLATION N/A 05/22/2018   Procedure: SVT ABLATION;  Surgeon: Marinus Maw, MD;  Location: [facility];  Service: Cardiovascular;  Laterality: N/A;   SVT ABLATION     2019    URETEROSCOPY WITH HOLMIUM LASER LITHOTRIPSY Left 08/04/2021   Procedure: URETEROSCOPY WITH basket removal of stones/ STENT exchange/retrograde;  Surgeon: Jerilee Field, MD;  Location: [facility];  Service: Urology;  Laterality: Left;  ONLY NEEDS 60 MIN    Current Hospital Medications:  Home Meds:  Current Meds  Medication Sig   acetaminophen (TYLENOL) 500 MG tablet Take 500 mg by mouth every 6 (six) hours as needed for mild pain (pain score 1-3), moderate pain (pain score 4-6) or headache.   hydrochlorothiazide (HYDRODIURIL) 25 MG tablet Take 25  mg by mouth 2 (two) times daily.   MOTRIN IB 200 MG tablet Take 800 mg by mouth every 6 (six) hours as needed (for pain).   potassium chloride SA (KLOR-CON M) 20 MEQ tablet Take 40 mEq by mouth 2 (two) times daily.   rizatriptan (MAXALT-MLT) 10 MG disintegrating tablet DISSOLVE 1 TABLET(10 MG) ON THE TONGUE DAILY AS NEEDED FOR MIGRAINE. MAY REPEAT IN 2 HOURS AS NEEDED. MAX 2 PER 24 HOURS (Patient taking differently: Take 10 mg by mouth daily as needed for migraine (dissolve on the tongue- may repeat once in 2 hours, if no relief (max of 2 tablets/24 hours)).)   tamsulosin (FLOMAX) 0.4 MG CAPS capsule Take 1 capsule (0.4 mg total) by mouth at bedtime. (Patient taking differently: Take 0.4 mg by mouth daily as needed (AS DIRECTED- if attempting to pass a kidney stone).)    Scheduled Meds:  acetaminophen  650 mg Oral Q4H   celecoxib  200 mg Oral BID   enoxaparin (LOVENOX) injection  50 mg Subcutaneous Q24H   hydrochlorothiazide  25 mg Oral BID   pantoprazole  40 mg Oral Daily   senna-docusate  2 tablet Oral BID   sodium chloride flush  3 mL Intravenous Q12H   tamsulosin  0.4 mg Oral BID   Continuous Infusions:  cefTRIAXone (ROCEPHIN)  IV Stopped (12/09/23 0837)   dextrose 5% lactated ringers with KCl 20 mEq/L 100 mL/hr at 12/09/23 0938   PRN Meds:.acetaminophen **OR** acetaminophen, HYDROmorphone (DILAUDID) injection, ondansetron (ZOFRAN) IV, oxyCODONE, polyethylene glycol, SUMAtriptan  Allergies:  Allergies  Allergen Reactions   Bactrim [Sulfamethoxazole-Trimethoprim] Nausea And Vomiting   Ketamine Other (See Comments)    Did not like the sensation  Family History  Problem Relation Age of Onset   Cancer Father    Kidney Stones Mother    Cancer Other    Hypertension Other    Hyperlipidemia Other    Stroke Other    Kidney Stones Brother     Social History:  reports that he quit smoking about 9 years ago. His smoking use included cigarettes. He started smoking about 11 years  ago. He has never used smokeless tobacco. He reports that he does not currently use alcohol. He reports that he does not use drugs.  ROS: A complete review of systems was performed.  All systems are negative except for pertinent findings as noted.  Physical Exam:  Vital signs in last 24 hours: Temp:  [97.9 F (36.6 C)-98.6 F (37 C)] 98.1 F (36.7 C) (12/20 1450) Pulse Rate:  [58-93] 75 (12/20 1151) Resp:  [16-70] 16 (12/20 1151) BP: (122-153)/(88-107) 122/107 (12/20 1151) SpO2:  [96 %-100 %] 99 % (12/20 1151) Weight:  [104.3 kg] 104.3 kg (12/19 1735) Constitutional:  Alert and oriented, uncomfortable  Cardiovascular: Regular rate and rhythm, No JVD Respiratory: Normal respiratory effort, Lungs clear bilaterally GI: Abdomen is soft, nontender, nondistended, no abdominal masses GU: No CVA tenderness Lymphatic: No lymphadenopathy Neurologic: Grossly intact, no focal deficits Psychiatric: Normal mood and affect  Laboratory Data:  Recent Labs    12/08/23 1750 12/09/23 0609  WBC 13.6* 9.8  HGB 15.4 14.2  HCT 45.7 44.4  PLT 242 197    Recent Labs    12/08/23 1750 12/09/23 0609  NA 137 135  K 3.7 3.7  CL 104 102  GLUCOSE 109* 124*  BUN 17 16  CALCIUM 9.6 8.5*  CREATININE 1.18 1.21     Results for orders placed or performed during the hospital encounter of 12/08/23 (from the past 24 hours)  CBC with Differential     Status: Abnormal   Collection Time: 12/08/23  5:50 PM  Result Value Ref Range   WBC 13.6 (H) 4.0 - 10.5 K/uL   RBC 5.45 4.22 - 5.81 MIL/uL   Hemoglobin 15.4 13.0 - 17.0 g/dL   HCT 12.4 58.0 - 99.8 %   MCV 83.9 80.0 - 100.0 fL   MCH 28.3 26.0 - 34.0 pg   MCHC 33.7 30.0 - 36.0 g/dL   RDW 33.8 25.0 - 53.9 %   Platelets 242 150 - 400 K/uL   nRBC 0.0 0.0 - 0.2 %   Neutrophils Relative % 80 %   Neutro Abs 10.7 (H) 1.7 - 7.7 K/uL   Lymphocytes Relative 15 %   Lymphs Abs 2.1 0.7 - 4.0 K/uL   Monocytes Relative 5 %   Monocytes Absolute 0.7 0.1 - 1.0  K/uL   Eosinophils Relative 0 %   Eosinophils Absolute 0.0 0.0 - 0.5 K/uL   Basophils Relative 0 %   Basophils Absolute 0.0 0.0 - 0.1 K/uL   Immature Granulocytes 0 %   Abs Immature Granulocytes 0.05 0.00 - 0.07 K/uL  Comprehensive metabolic panel     Status: Abnormal   Collection Time: 12/08/23  5:50 PM  Result Value Ref Range   Sodium 137 135 - 145 mmol/L   Potassium 3.7 3.5 - 5.1 mmol/L   Chloride 104 98 - 111 mmol/L   CO2 20 (L) 22 - 32 mmol/L   Glucose, Bld 109 (H) 70 - 99 mg/dL   BUN 17 6 - 20 mg/dL   Creatinine, Ser 7.67 0.61 - 1.24 mg/dL   Calcium 9.6 8.9 -  10.3 mg/dL   Total Protein 8.4 (H) 6.5 - 8.1 g/dL   Albumin 4.5 3.5 - 5.0 g/dL   AST 34 15 - 41 U/L   ALT 26 0 - 44 U/L   Alkaline Phosphatase 71 38 - 126 U/L   Total Bilirubin 0.6 <1.2 mg/dL   GFR, Estimated >16 >10 mL/min   Anion gap 13 5 - 15  Lipase, blood     Status: None   Collection Time: 12/08/23  5:50 PM  Result Value Ref Range   Lipase 23 11 - 51 U/L  Urinalysis, w/ Reflex to Culture (Infection Suspected) -Urine, Clean Catch     Status: Abnormal   Collection Time: 12/08/23  7:09 PM  Result Value Ref Range   Specimen Source URINE, CLEAN CATCH    Color, Urine YELLOW YELLOW   APPearance CLOUDY (A) CLEAR   Specific Gravity, Urine 1.025 1.005 - 1.030   pH 5.0 5.0 - 8.0   Glucose, UA NEGATIVE NEGATIVE mg/dL   Hgb urine dipstick SMALL (A) NEGATIVE   Bilirubin Urine NEGATIVE NEGATIVE   Ketones, ur 5 (A) NEGATIVE mg/dL   Protein, ur 960 (A) NEGATIVE mg/dL   Nitrite NEGATIVE NEGATIVE   Leukocytes,Ua SMALL (A) NEGATIVE   RBC / HPF 6-10 0 - 5 RBC/hpf   WBC, UA >50 0 - 5 WBC/hpf   Bacteria, UA NONE SEEN NONE SEEN   Squamous Epithelial / HPF 0-5 0 - 5 /HPF   Mucus PRESENT   HIV Antibody (routine testing w rflx)     Status: None   Collection Time: 12/09/23  6:08 AM  Result Value Ref Range   HIV Screen 4th Generation wRfx Non Reactive Non Reactive  APTT     Status: None   Collection Time: 12/09/23  6:09 AM   Result Value Ref Range   aPTT 29 24 - 36 seconds  Protime-INR     Status: None   Collection Time: 12/09/23  6:09 AM  Result Value Ref Range   Prothrombin Time 13.9 11.4 - 15.2 seconds   INR 1.1 0.8 - 1.2  Basic metabolic panel     Status: Abnormal   Collection Time: 12/09/23  6:09 AM  Result Value Ref Range   Sodium 135 135 - 145 mmol/L   Potassium 3.7 3.5 - 5.1 mmol/L   Chloride 102 98 - 111 mmol/L   CO2 26 22 - 32 mmol/L   Glucose, Bld 124 (H) 70 - 99 mg/dL   BUN 16 6 - 20 mg/dL   Creatinine, Ser 4.54 0.61 - 1.24 mg/dL   Calcium 8.5 (L) 8.9 - 10.3 mg/dL   GFR, Estimated >09 >81 mL/min   Anion gap 7 5 - 15  CBC     Status: None   Collection Time: 12/09/23  6:09 AM  Result Value Ref Range   WBC 9.8 4.0 - 10.5 K/uL   RBC 5.11 4.22 - 5.81 MIL/uL   Hemoglobin 14.2 13.0 - 17.0 g/dL   HCT 19.1 47.8 - 29.5 %   MCV 86.9 80.0 - 100.0 fL   MCH 27.8 26.0 - 34.0 pg   MCHC 32.0 30.0 - 36.0 g/dL   RDW 62.1 30.8 - 65.7 %   Platelets 197 150 - 400 K/uL   nRBC 0.0 0.0 - 0.2 %   No results found for this or any previous visit (from the past 240 hours).  Renal Function: Recent Labs    12/08/23 1750 12/09/23 0609  CREATININE 1.18 1.21   Estimated  Creatinine Clearance: 103.6 mL/min (by C-G formula based on SCr of 1.21 mg/dL).  Radiologic Imaging: US RENAL Result Date: 12/09/2023 CLINICAL DATA:  Hydronephrosis EXAM: RENAL / URINARY TRACT ULTRASOUND COMPLETE COMPARISON:  Renal ultrasound 01/23/2022. CT abdomen and pelvis 12/08/2023. FINDINGS: Right Kidney: Renal measurements: 10.3 x 7.3 x 4.8 cm = volume: 198 mL. Echogenicity within normal limits. No mass or hydronephrosis visualized. Left Kidney: Renal measurements: 11.7 x 6.6 x 5.5 cm = volume: 5.5 mL. Echogenicity within normal limits. There is moderate hydronephrosis. No focal lesion. Bladder: Appears normal for degree of bladder distention. Other: None. IMPRESSION: Moderate left hydronephrosis. Electronically Signed   By: Darliss Cheney M.D.   On: 12/09/2023 01:54   CT Renal Stone Study Result Date: 12/08/2023 CLINICAL DATA:  Abdominal and flank pain EXAM: CT ABDOMEN AND PELVIS WITHOUT CONTRAST TECHNIQUE: Multidetector CT imaging of the abdomen and pelvis was performed following the standard protocol without IV contrast. RADIATION DOSE REDUCTION: This exam was performed according to the departmental dose-optimization program which includes automated exposure control, adjustment of the mA and/or kV according to patient size and/or use of iterative reconstruction technique. COMPARISON:  Renal stone CT 07/27/2023 FINDINGS: Lower chest: No acute abnormality. Hepatobiliary: There is diffuse fatty infiltration of the liver. Gallbladder and bile ducts are within normal limits. Pancreas: Unremarkable. No pancreatic ductal dilatation or surrounding inflammatory changes. Spleen: Normal in size without focal abnormality. Adrenals/Urinary Tract: There are 2 adjacent calculi in the mid left ureter measuring 3 mm. There is moderate left-sided hydronephrosis. There are additional punctate bilateral renal calculi measuring up to 5 mm. There is no right-sided hydronephrosis. The adrenal glands and bladder are within normal limits. Stomach/Bowel: There is partial small-bowel malrotation with small bowel loops located in the right abdomen and colon located in the left abdomen. The appendix appears within normal limits in his located in the pelvis. There is no focal wall thickening or inflammation. The stomach is within normal limits. No bowel obstruction or free air. Vascular/Lymphatic: No significant vascular findings are present. No enlarged abdominal or pelvic lymph nodes. Reproductive: Prostate is unremarkable. Other: No abdominal wall hernia or abnormality. No abdominopelvic ascites. Musculoskeletal: There is levoconvex scoliosis of the lumbar spine. IMPRESSION: 1. There are 2 adjacent calculi in the mid left ureter measuring 3 mm with moderate  left-sided hydronephrosis. 2. Additional bilateral nonobstructing renal calculi. 3. Fatty infiltration of the liver. 4. Congenital partial small-bowel malrotation. No bowel obstruction. Electronically Signed   By: Darliss Cheney M.D.   On: 12/08/2023 20:47    I independently reviewed the above imaging studies.  Impression/Recommendation 32 year old male with obstructing left ureteral stones  The risks, benefits and alternatives of cystoscopy with LEFT ureteroscopy, laser lithotripsy and ureteral stent placement was discussed the patient.  Risks included, but are not limited to: bleeding, urinary tract infection, ureteral injury/avulsion, ureteral stricture formation, retained stone fragments, the possibility that multiple surgeries may be required to treat the stone(s), MI, stroke, PE and the inherent risks of general anesthesia.  The patient voices understanding and wishes to proceed.     Rhoderick Moody, MD [facility] 12/09/2023, 3:23 PM

## 2023-12-09 NOTE — Final Consult Note (Signed)
Urology Consult Note   Requesting Attending Physician:  Nolberto Hanlon, MD Service Providing Consult: Urology  Consulting Attending: Stevie Kern, MD  Reason for Consult:  Left ureteral stone  HPI:  Patient is a 32 year old male with past medical history significant for nephrolithiasis who presents to the ED with ongoing left flank pain.   Workup in the ED showing 6 mm mid left ureteral stone with moderate hydronephrosis and known mild renal pelvis stone burden.  Reassuringly, the patient is hemodynamically stable.  He does have very mild white count, consistent with poor p.o. intake and nausea vomiting over the last couple of days.  His creatinine is 1.1, slightly increased above his baseline.  His urinalysis is not overtly concerning for infection, but does have significant amount of white blood cells. He has remained afebrile.  Patient has history of nephrolithiasis in the past.  He last required ureteral stone extraction on August/2024 with Dr. Laverle Patter.  Past Medical History: Past Medical History:  Diagnosis Date   Complication of anesthesia    Dysrhythmia    hx of SVT ablation in 2019 - DR Lewayne Bunting dismissed by MD    Frequency-urgency syndrome    History of kidney stones    Hx of nausea and vomiting    d/t kidney stone   Hx of sepsis 08/11/2017   due to kidney stone/ hydronephrosis   Hypothyroidism    Left ureteral stone    Migraine    hx of migraines    Pain due to ureteral stent (HCC)    PONV (postoperative nausea and vomiting)    none recently   Scoliosis of lumbar spine 1994   treated at [facility] until age 52   Wears glasses    reading only    Past Surgical History:  Past Surgical History:  Procedure Laterality Date   CYSTOSCOPY W/ RETROGRADES Right 07/06/2015   Procedure: CYSTOSCOPY WITH RETROGRADE PYELOGRAM;  Surgeon: Jerilee Field, MD;  Location: [facility];  Service: Urology;  Laterality: Right;   CYSTOSCOPY W/ URETERAL STENT PLACEMENT Left 08/08/2015    Procedure: CYSTOSCOPY WITH STENT REPLACEMENT;  Surgeon: Jerilee Field, MD;  Location: [facility];  Service: Urology;  Laterality: Left;   CYSTOSCOPY W/ URETERAL STENT PLACEMENT Left 02/21/2017   Procedure: CYSTOSCOPY WITH RETROGRADE PYELOGRAM/URETERAL LEFT STENT PLACEMENT WITH  LASER;  Surgeon: Bjorn Pippin, MD;  Location: [facility];  Service: Urology;  Laterality: Left;   CYSTOSCOPY W/ URETERAL STENT PLACEMENT Left 08/10/2017   Procedure: CYSTOSCOPY WITH RETROGRADE PYELOGRAM/URETERAL STENT PLACEMENT;  Surgeon: Heloise Purpura, MD;  Location: [facility];  Service: Urology;  Laterality: Left;   CYSTOSCOPY W/ URETERAL STENT PLACEMENT Bilateral 04/19/2021   Procedure: CYSTOSCOPY WITH RETROGRADE PYELOGRAM/URETERAL STENT PLACEMENT;  Surgeon: Sebastian Ache, MD;  Location: [facility];  Service: Urology;  Laterality: Bilateral;   CYSTOSCOPY W/ URETERAL STENT PLACEMENT Left 07/11/2021   Procedure: CYSTOSCOPY WITH RETROGRADE PYELOGRAM/URETEROSCOPY/URETERAL STENT PLACEMENT;  Surgeon: Marcine Matar, MD;  Location: [facility];  Service: Urology;  Laterality: Left;   CYSTOSCOPY W/ URETERAL STENT PLACEMENT Left 11/04/2021   Procedure: CYSTOSCOPY WITH RETROGRADE PYELOGRAM//URETEROSCOPY/STONE EXTRACTION/URETERAL STENT PLACEMENT;  Surgeon: Marcine Matar, MD;  Location: [facility];  Service: Urology;  Laterality: Left;   CYSTOSCOPY W/ URETERAL STENT PLACEMENT Bilateral 05/04/2022   Procedure: CYSTOSCOPY WITH RETROGRADE PYELOGRAM/URETERAL STENT PLACEMENT;  Surgeon: Sebastian Ache, MD;  Location: [facility];  Service: Urology;  Laterality: Bilateral;   CYSTOSCOPY WITH RETROGRADE PYELOGRAM, URETEROSCOPY AND STENT PLACEMENT Left 06/24/2014   Procedure: CYSTOSCOPY WITH RETROGRADE PYELOGRAM,  AND STENT PLACEMENT;  Surgeon: Magdalene Molly, MD;  Location: [facility];  Service: Urology;  Laterality: Left;   CYSTOSCOPY WITH RETROGRADE PYELOGRAM, URETEROSCOPY AND STENT PLACEMENT Left 07/05/2014   Procedure: CYSTO/LEFT  URETEROSCOPY/LEFT RETROGRADE PYELOGRAM/LEFT STENT PLACEMENT;  Surgeon: Jerilee Field, MD;  Location: [facility];  Service: Urology;  Laterality: Left;   CYSTOSCOPY WITH RETROGRADE PYELOGRAM, URETEROSCOPY AND STENT PLACEMENT Left 07/06/2015   Procedure: CYSTOSCOPY WITH RETROGRADE PYELOGRAM, URETEROSCOPY , LASER, STENT PLACEMENT and BASKET EXTRACTION;  Surgeon: Jerilee Field, MD;  Location: [facility];  Service: Urology;  Laterality: Left;   CYSTOSCOPY WITH RETROGRADE PYELOGRAM, URETEROSCOPY AND STENT PLACEMENT Left 08/19/2015   Procedure: CYSTOSCOPY WITH RETROGRADE PYELOGRAM, URETEROSCOPY AND STENT PLACEMENT;  Surgeon: Jerilee Field, MD;  Location: [facility];  Service: Urology;  Laterality: Left;   CYSTOSCOPY WITH RETROGRADE PYELOGRAM, URETEROSCOPY AND STENT PLACEMENT Left 03/13/2018   Procedure: CYSTOSCOPY WITH RETROGRADE PYELOGRAM, URETEROSCOPY AND LEFT STENT PLACEMENT;  Surgeon: Bjorn Pippin, MD;  Location: [facility];  Service: Urology;  Laterality: Left;   CYSTOSCOPY WITH RETROGRADE PYELOGRAM, URETEROSCOPY AND STENT PLACEMENT Left 03/11/2021   Procedure: CYSTOSCOPY WITH RETROGRADE PYELOGRAM, URETEROSCOPY AND STENT PLACEMENT,STONE EXTRACTION,HOLMIUM LASER;  Surgeon: Marcine Matar, MD;  Location: [facility];  Service: Urology;  Laterality: Left;   CYSTOSCOPY WITH RETROGRADE PYELOGRAM, URETEROSCOPY AND STENT PLACEMENT Bilateral 05/06/2021   Procedure: CYSTOSCOPY WITH RETROGRADE PYELOGRAM, URETEROSCOPY AND STENT REPLACEMENT;  Surgeon: Sebastian Ache, MD;  Location: [facility];  Service: Urology;  Laterality: Bilateral;  90 MINS   CYSTOSCOPY WITH RETROGRADE PYELOGRAM, URETEROSCOPY AND STENT PLACEMENT Bilateral 05/19/2022   Procedure: CYSTOSCOPY WITH RETROGRADE PYELOGRAM, URETEROSCOPY AND STENT  EXCHANGE;  Surgeon: Sebastian Ache, MD;  Location: [facility];  Service: Urology;  Laterality: Bilateral;  90 MINS   CYSTOSCOPY WITH RETROGRADE PYELOGRAM, URETEROSCOPY AND STENT  PLACEMENT Bilateral 06/09/2022   Procedure: CYSTOSCOPY WITH RETROGRADE PYELOGRAM, URETEROSCOPY AND STENT  EXCHANGE;  Surgeon: Sebastian Ache, MD;  Location: [facility];  Service: Urology;  Laterality: Bilateral;  75 MINS   CYSTOSCOPY WITH URETEROSCOPY AND STENT PLACEMENT Left 01/16/2016   Procedure: CYSTOSCOPY WITH LEFT RETROGRADE PYELOGRAM  LEFT DIGITAL URETEROSCOPY AND PLACEMENT LEFT URETERAL STENT;  Surgeon: Jerilee Field, MD;  Location: [facility];  Service: Urology;  Laterality: Left;   CYSTOSCOPY WITH URETEROSCOPY, STONE BASKETRY AND STENT PLACEMENT Left 02/13/2016   Procedure: CYSTOSCOPY WITH LEFT URETEROSCOPY, HOLMIUM LASER AND STENT PLACEMENT;  Surgeon: Jerilee Field, MD;  Location: [facility];  Service: Urology;  Laterality: Left;   CYSTOSCOPY/RETROGRADE/URETEROSCOPY/STONE EXTRACTION WITH BASKET Left 08/08/2015   Procedure: CYSTOSCOPY/RETROGRADE/URETEROSCOPY/STONE EXTRACTION WITH BASKET;  Surgeon: Jerilee Field, MD;  Location: [facility];  Service: Urology;  Laterality: Left;   CYSTOSCOPY/URETEROSCOPY/HOLMIUM LASER/STENT PLACEMENT Left 07/30/2016   Procedure: CYSTOSCOPY/URETEROSCOPY/HOLMIUM LASER/STENT PLACEMENT;  Surgeon: Jerilee Field, MD;  Location: [facility];  Service: Urology;  Laterality: Left;   CYSTOSCOPY/URETEROSCOPY/HOLMIUM LASER/STENT PLACEMENT Left 08/19/2017   Procedure: CYSTOSCOPY/URETEROSCOPY/HOLMIUM LASER/STENT PLACEMENT;  Surgeon: Jerilee Field, MD;  Location: [facility];  Service: Urology;  Laterality: Left;   CYSTOSCOPY/URETEROSCOPY/HOLMIUM LASER/STENT PLACEMENT N/A 10/13/2018   Procedure: CYSTOSCOPY/RETROGRADE RIGHT URETEROSCOPY/ AND RIGHTSTENT PLACEMENT;  Surgeon: Bjorn Pippin, MD;  Location: [facility];  Service: Urology;  Laterality: N/A;   CYSTOSCOPY/URETEROSCOPY/HOLMIUM LASER/STENT PLACEMENT Left 07/27/2023   Procedure: CYSTOSCOPY/URETEROSCOPY/HOLMIUM LASER/STENT PLACEMENT;  Surgeon: Heloise Purpura, MD;  Location: [facility];   Service: Urology;  Laterality: Left;   EXTRACORPOREAL SHOCK WAVE LITHOTRIPSY Left 07-07-2015  &  12-26-2014   FOOT CAPSULE RELEASE W/ PERCUTANEOUS HEEL CORD LENGTHENING,  TIBIAL TENDON TRANSFER Left 1994   clubfoot   HOLMIUM LASER APPLICATION Left 07/05/2014   Procedure: LASER LITHO;  Surgeon: Jerilee Field, MD;  Location: [facility];  Service: Urology;  Laterality: Left;   HOLMIUM LASER APPLICATION Left 08/08/2015   Procedure: HOLMIUM LASER  WITH LITHOTRIPSY ;  Surgeon: Jerilee Field, MD;  Location: [facility];  Service: Urology;  Laterality: Left;   HOLMIUM LASER APPLICATION Left 02/21/2017   Procedure: HOLMIUM LASER APPLICATION;  Surgeon: Bjorn Pippin, MD;  Location: [facility];  Service: Urology;  Laterality: Left;   HOLMIUM LASER APPLICATION Left 03/13/2018   Procedure: HOLMIUM LASER APPLICATION;  Surgeon: Bjorn Pippin, MD;  Location: [facility];  Service: Urology;  Laterality: Left;   HOLMIUM LASER APPLICATION Bilateral 05/06/2021   Procedure: HOLMIUM LASER APPLICATION;  Surgeon: Sebastian Ache, MD;  Location: [facility];  Service: Urology;  Laterality: Bilateral;   HOLMIUM LASER APPLICATION Bilateral 05/19/2022   Procedure: HOLMIUM LASER APPLICATION;  Surgeon: Sebastian Ache, MD;  Location: [facility];  Service: Urology;  Laterality: Bilateral;   HOLMIUM LASER APPLICATION Bilateral 06/09/2022   Procedure: HOLMIUM LASER APPLICATION;  Surgeon: Sebastian Ache, MD;  Location: [facility];  Service: Urology;  Laterality: Bilateral;   lumpectomy in neck for cat scratch fever      surgery for club foot at few days old   LYMPH GLAND EXCISION  2003   neck--  benign   SVT ABLATION N/A 05/22/2018   Procedure: SVT ABLATION;  Surgeon: Marinus Maw, MD;  Location: [facility];  Service: Cardiovascular;  Laterality: N/A;   SVT ABLATION     2019    URETEROSCOPY WITH HOLMIUM LASER LITHOTRIPSY Left 08/04/2021   Procedure: URETEROSCOPY WITH basket removal of  stones/ STENT exchange/retrograde;  Surgeon: Jerilee Field, MD;  Location: [facility];  Service: Urology;  Laterality: Left;  ONLY NEEDS 60 MIN    Medication: Current Facility-Administered Medications  Medication Dose Route Frequency Provider Last Rate Last Admin   acetaminophen (TYLENOL) tablet 650 mg  650 mg Oral Q6H PRN Nolberto Hanlon, MD       Or   acetaminophen (TYLENOL) suppository 650 mg  650 mg Rectal Q6H PRN Nolberto Hanlon, MD       acetaminophen (TYLENOL) tablet 650 mg  650 mg Oral Q4H Nolberto Hanlon, MD   650 mg at 12/08/23 2309   cefTRIAXone (ROCEPHIN) 2 g in sodium chloride 0.9 % 100 mL IVPB  2 g Intravenous Q24H Nolberto Hanlon, MD 200 mL/hr at 12/08/23 2310 2 g at 12/08/23 2310   celecoxib (CELEBREX) capsule 200 mg  200 mg Oral BID Nolberto Hanlon, MD   200 mg at 12/08/23 2309   dextrose 5% in lactated ringers with KCl 20 mEq/L infusion   Intravenous Continuous Nolberto Hanlon, MD 100 mL/hr at 12/08/23 2311 New Bag at 12/08/23 2311   enoxaparin (LOVENOX) injection 50 mg  50 mg Subcutaneous Q24H Nolberto Hanlon, MD       hydrochlorothiazide (HYDRODIURIL) tablet 25 mg  25 mg Oral BID Nolberto Hanlon, MD   25 mg at 12/08/23 2309   HYDROmorphone (DILAUDID) injection 1 mg  1 mg Intravenous Q3H PRN Nolberto Hanlon, MD   1 mg at 12/08/23 2307   ondansetron (ZOFRAN) injection 4 mg  4 mg Intravenous Q6H PRN Nolberto Hanlon, MD       oxyCODONE (Oxy IR/ROXICODONE) immediate release tablet 5 mg  5 mg Oral Q4H PRN Nolberto Hanlon, MD   5 mg  at 12/08/23 2315   polyethylene glycol (MIRALAX / GLYCOLAX) packet 17 g  17 g Oral Daily PRN Nolberto Hanlon, MD       potassium chloride (KLOR-CON) packet 60 mEq  60 mEq Oral BID Nolberto Hanlon, MD   60 mEq at 12/08/23 2306   senna-docusate (Senokot-S) tablet 2 tablet  2 tablet Oral BID Nolberto Hanlon, MD   2 tablet at 12/08/23 2309   sodium chloride flush (NS) 0.9 % injection 3 mL  3 mL Intravenous Q12H Nolberto Hanlon, MD   3 mL at 12/08/23 2313   SUMAtriptan (IMITREX) tablet 100 mg  100  mg Oral Daily PRN Lonell Grandchild, MD       tamsulosin [facility]) capsule 0.4 mg  0.4 mg Oral BID Nolberto Hanlon, MD   0.4 mg at 12/08/23 2309   Current Outpatient Medications  Medication Sig Dispense Refill   acetaminophen (TYLENOL) 500 MG tablet Take 500 mg by mouth every 6 (six) hours as needed for mild pain (pain score 1-3), moderate pain (pain score 4-6) or headache.     hydrochlorothiazide (HYDRODIURIL) 25 MG tablet Take 25 mg by mouth 2 (two) times daily.     MOTRIN IB 200 MG tablet Take 800 mg by mouth every 6 (six) hours as needed (for pain).     potassium chloride SA (KLOR-CON M) 20 MEQ tablet Take 40 mEq by mouth 2 (two) times daily.     rizatriptan (MAXALT-MLT) 10 MG disintegrating tablet DISSOLVE 1 TABLET(10 MG) ON THE TONGUE DAILY AS NEEDED FOR MIGRAINE. MAY REPEAT IN 2 HOURS AS NEEDED. MAX 2 PER 24 HOURS (Patient taking differently: Take 10 mg by mouth daily as needed for migraine (dissolve on the tongue- may repeat once in 2 hours, if no relief (max of 2 tablets/24 hours)).) 10 tablet 2   tamsulosin (FLOMAX) 0.4 MG CAPS capsule Take 1 capsule (0.4 mg total) by mouth at bedtime. (Patient taking differently: Take 0.4 mg by mouth daily as needed (AS DIRECTED- if attempting to pass a kidney stone).) 14 capsule 0   phenazopyridine (PYRIDIUM) 200 MG tablet Take 1 tablet (200 mg total) by mouth 3 (three) times daily as needed. (Patient not taking: Reported on 12/08/2023) 20 tablet 0   promethazine (PHENERGAN) 25 MG tablet Take 1 tablet (25 mg total) by mouth every 8 (eight) hours as needed for up to 20 doses for nausea or vomiting. (Patient not taking: Reported on 12/08/2023) 20 tablet 0   traMADol (ULTRAM) 50 MG tablet Take 1-2 tablets (50-100 mg total) by mouth every 6 (six) hours as needed (pain). (Patient not taking: Reported on 12/08/2023) 15 tablet 0    Allergies: Allergies  Allergen Reactions   Bactrim [Sulfamethoxazole-Trimethoprim] Nausea And Vomiting   Ketamine Other (See  Comments)    Did not like the sensation    Social History: Social History   Tobacco Use   Smoking status: Former    Current packs/day: 0.00    Types: Cigarettes    Start date: 07/29/2012    Quit date: 07/29/2014    Years since quitting: 9.3   Smokeless tobacco: Never   Tobacco comments:    social smoker  Vaping Use   Vaping status: Every Day   Substances: Flavoring  Substance Use Topics   Alcohol use: Not Currently    Alcohol/week: 0.0 standard drinks of alcohol   Drug use: No    Family History Family History  Problem Relation Age of Onset   Cancer Father    Kidney  Stones Mother    Cancer Other    Hypertension Other    Hyperlipidemia Other    Stroke Other    Kidney Stones Brother     Review of Systems 10 systems were reviewed and are negative except as noted specifically in the HPI.  Objective   Vital signs in last 24 hours: BP (!) 153/102 (BP Location: Left Arm)   Pulse 72   Temp 98 F (36.7 C) (Oral)   Resp (!) 70   Ht 5\' 8"  (1.727 m)   Wt 104.3 kg   SpO2 96%   BMI 34.97 kg/m   Physical Exam General: NAD, A&O, resting, appropriate HEENT: [place]/AT, EOMI, MMM Pulmonary: Normal work of breathing Cardiovascular: HDS, adequate peripheral perfusion Abdomen: Soft, NTTP, nondistended. GU: Voiding spontaneously Extremities: warm and well perfused Neuro: Appropriate, no focal neurological deficits  Most Recent Labs: Lab Results  Component Value Date   WBC 13.6 (H) 12/08/2023   HGB 15.4 12/08/2023   HCT 45.7 12/08/2023   PLT 242 12/08/2023    Lab Results  Component Value Date   NA 137 12/08/2023   K 3.7 12/08/2023   CL 104 12/08/2023   CO2 20 (L) 12/08/2023   BUN 17 12/08/2023   CREATININE 1.18 12/08/2023   CALCIUM 9.6 12/08/2023    Lab Results  Component Value Date   INR 1.00 05/23/2018   APTT 30 05/23/2018     Urine Culture: @[id number] (laburin,org,[id number])@   IMAGING: US RENAL Result Date: 12/09/2023 CLINICAL DATA:   Hydronephrosis EXAM: RENAL / URINARY TRACT ULTRASOUND COMPLETE COMPARISON:  Renal ultrasound 01/23/2022. CT abdomen and pelvis 12/08/2023. FINDINGS: Right Kidney: Renal measurements: 10.3 x 7.3 x 4.8 cm = volume: 198 mL. Echogenicity within normal limits. No mass or hydronephrosis visualized. Left Kidney: Renal measurements: 11.7 x 6.6 x 5.5 cm = volume: 5.5 mL. Echogenicity within normal limits. There is moderate hydronephrosis. No focal lesion. Bladder: Appears normal for degree of bladder distention. Other: None. IMPRESSION: Moderate left hydronephrosis. Electronically Signed   By: Darliss Cheney M.D.   On: 12/09/2023 01:54   CT Renal Stone Study Result Date: 12/08/2023 CLINICAL DATA:  Abdominal and flank pain EXAM: CT ABDOMEN AND PELVIS WITHOUT CONTRAST TECHNIQUE: Multidetector CT imaging of the abdomen and pelvis was performed following the standard protocol without IV contrast. RADIATION DOSE REDUCTION: This exam was performed according to the departmental dose-optimization program which includes automated exposure control, adjustment of the mA and/or kV according to patient size and/or use of iterative reconstruction technique. COMPARISON:  Renal stone CT 07/27/2023 FINDINGS: Lower chest: No acute abnormality. Hepatobiliary: There is diffuse fatty infiltration of the liver. Gallbladder and bile ducts are within normal limits. Pancreas: Unremarkable. No pancreatic ductal dilatation or surrounding inflammatory changes. Spleen: Normal in size without focal abnormality. Adrenals/Urinary Tract: There are 2 adjacent calculi in the mid left ureter measuring 3 mm. There is moderate left-sided hydronephrosis. There are additional punctate bilateral renal calculi measuring up to 5 mm. There is no right-sided hydronephrosis. The adrenal glands and bladder are within normal limits. Stomach/Bowel: There is partial small-bowel malrotation with small bowel loops located in the right abdomen and colon located in the left  abdomen. The appendix appears within normal limits in his located in the pelvis. There is no focal wall thickening or inflammation. The stomach is within normal limits. No bowel obstruction or free air. Vascular/Lymphatic: No significant vascular findings are present. No enlarged abdominal or pelvic lymph nodes. Reproductive: Prostate is unremarkable. Other: No abdominal wall hernia or  abnormality. No abdominopelvic ascites. Musculoskeletal: There is levoconvex scoliosis of the lumbar spine. IMPRESSION: 1. There are 2 adjacent calculi in the mid left ureter measuring 3 mm with moderate left-sided hydronephrosis. 2. Additional bilateral nonobstructing renal calculi. 3. Fatty infiltration of the liver. 4. Congenital partial small-bowel malrotation. No bowel obstruction. Electronically Signed   By: Darliss Cheney M.D.   On: 12/08/2023 20:47    ------  Assessment:  32 y.o. male with 6 mm left ureteral stone with associated hydronephrosis.  While not overly concerning for infection, in the setting of poor pain control, will plan for interval stent placement and stone extraction.   Recommendations: -Patient admitted to hospitalist team -Please keep n.p.o. after midnight -Recommend Flomax maintenance fluids, strain all urine, continue medical expulsion therapy until time of procedure -Case discussed with anesthesia, case scheduled tentatively for 12/09/2023 -Consent to be obtained in the preoperative area  Roby Lofts, MD Resident Physician [facility]    Thank you for this consult. Please contact the urology consult pager with any further questions/concerns.

## 2023-12-09 NOTE — Hospital Course (Addendum)
Thomas Howell was admitted to the hospital with the working diagnosis of nephrolithiasis complicated with left hydronephrosis.   32 yo male with the past medical history of nephrolithiasis who presented with left flank pain. Reported acute onset of left flank pain, pressure type, associated with nausea and vomiting. Positive dysuria but not fever of chills. On his initial physical examination his blood pressure was 152/104, 134/88, HR 93, RR 20 and 02 saturation 97%, lungs with no wheezing or rales, heart with S1 and S2 present and regular, abdomen with no distention and no lower extremity edema.   Na 137, K 3,7 Cl 104 bicarbonate 20, glucose 109, bun 17 cr 1,18  Wbc 13 hgb 15.4 plt 242  Urine with SG 1,025, >50 wbc, small leukocytes and 6-10 rbc, small hgb.   CT renal with 2 adjacent calculi in the mid left ureter measuring 3 mm with moderate left sided hydronephrosis.  Additional bilateral non obstructing renal calculi Fatty infiltration of the liver.   Renal US with moderate left hydronephrosis.   Patient was placed on IV fluids and IV analgesics.  Urology has been consulted.   12/20 patient has not pass stone spontaneously, plan for extraction per urology.  Cystoscopy with left ureteroscopy, holmium laser lithotripsy and left JJ stent placement Left retrograde pyelogram with intraoperative interpretation fluoroscopic imaging  12/21 patient feeling better, plan for discharge home and follow with urology as outpatient.

## 2023-12-09 NOTE — Anesthesia Procedure Notes (Signed)
Procedure Name: LMA Insertion Date/Time: 12/09/2023 5:40 PM  Performed by: Theodosia Quay, CRNAPre-anesthesia Checklist: Patient identified, Emergency Drugs available, Suction available, Patient being monitored and Timeout performed Patient Re-evaluated:Patient Re-evaluated prior to induction Oxygen Delivery Method: Circle system utilized Preoxygenation: Pre-oxygenation with 100% oxygen Induction Type: IV induction Ventilation: Mask ventilation without difficulty LMA: LMA with gastric port inserted LMA Size: 5.0 Number of attempts: 1 Placement Confirmation: positive ETCO2 and breath sounds checked- equal and bilateral Tube secured with: Tape Dental Injury: Teeth and Oropharynx as per pre-operative assessment

## 2023-12-09 NOTE — Progress Notes (Addendum)
  Progress Note   Patient: Thomas Howell NGE:[id number] DOB: 1991/08/03 DOA: 12/08/2023     1 DOS: the patient was seen and examined on 12/09/2023   Brief hospital course: Thomas Howell was admitted to the hospital with the working diagnosis of nephrolithiasis complicated with left hydronephrosis.   32 yo male with the past medical history of nephrolithiasis who presented with left flank pain. Reported acute onset of left flank pain, pressure type, associated with nausea and vomiting. Positive dysuria but not fever of chills. On his initial physical examination his blood pressure was 152/104, 134/88, HR 93, RR 20 and 02 saturation 97%, lungs with no wheezing or rales, heart with S1 and S2 present and regular, abdomen with no distention and no lower extremity edema.   Na 137, K 3,7 Cl 104 bicarbonate 20, glucose 109, bun 17 cr 1,18  Wbc 13 hgb 15.4 plt 242  Urine with SG 1,025, >50 wbc, small leukocytes and 6-10 rbc, small hgb.   CT renal with 2 adjacent calculi in the mid left ureter measuring 3 mm with moderate left sided hydronephrosis.  Additional bilateral non obstructing renal calculi Fatty infiltration of the liver.   Renal US with moderate left hydronephrosis.   Patient was placed on IV fluids and IV analgesics.  Urology has been consulted.   12/20 patient has not pass stone spontaneously, plan for extraction per urology.       Assessment and Plan: * Hydronephrosis concurrent with and due to calculi of kidney and ureter Patient has not pass stone this morning His pain has improved with IV analgesics but has not resolved.   Plan to continue pain control with IV hydromorphone and hydration with IV fluids.  Follow up with urology recommendations for stone extraction.  Continue with hydrochlorothiazide and flomax.   Supportive medical therapy with antiacids and antiemetics.   UTI (urinary tract infection) Continue antibiotic therapy with ceftriaxone.   SVT  (supraventricular tachycardia) (HCC) S/p ablation in 2018 Stable on this admission.   Migraine headache without aura C.w. rizatriptan prn    Subjective: Patient continue to have left flank pain, improved in intensity but not resolved. No nausea or vomiting, no dyspnea or chest pain   Physical Exam: Vitals:   12/09/23 0547 12/09/23 0548 12/09/23 0850 12/09/23 0957  BP:  (!) 136/91 (!) 137/95   Pulse:  (!) 58 85   Resp:  16 16   Temp:  98.1 F (36.7 C)  98 F (36.7 C)  TempSrc:  Oral  Oral  SpO2: 100% 100% 100%   Weight:      Height:       Neurology awake and alert ENT with no pallor or icterus Cardiovascular with S1 and S2 present and regular with no gallops, rubs or murmurs Respiratory with no rales or wheezing, no rhonchi Abdomen with no distention  Positive pain to percussion at the left flank (lower) No lower extremity edema  Data Reviewed:    Family Communication: no family at the bedside   Disposition: Status is: Inpatient Remains inpatient appropriate because: pending stone extraction per urology   Planned Discharge Destination: Home      Author: Coralie Keens, MD 12/09/2023 10:14 AM  For on call review [email or web address].

## 2023-12-09 NOTE — Assessment & Plan Note (Addendum)
Sp laser lithotripsy and left internal jugular stent placement.    Overnight he required IV hydromorphone for pain control.  Follow up wbc is 14.8, urine culture with no growth.   Patient has received 2 doses of ceftriaxone 1 g. He has been afebrile.  Continue with hydrochlorothiazide and flomax.  Follow up with Urology as outpatient in 1 week for office cystoscopy and stent removal.  Continue pain control with tramadol.

## 2023-12-09 NOTE — Anesthesia Preprocedure Evaluation (Signed)
Anesthesia Evaluation  Patient identified by MRN, date of birth, ID band Patient awake    Reviewed: Allergy & Precautions, NPO status , Patient's Chart, lab work & pertinent test results  History of Anesthesia Complications (+) PONV and history of anesthetic complications  Airway Mallampati: II  TM Distance: >3 FB Neck ROM: Full    Dental no notable dental hx.    Pulmonary Patient abstained from smoking., former smoker   Pulmonary exam normal        Cardiovascular  Rhythm:Regular Rate:Normal     Neuro/Psych  Headaches  Anxiety        GI/Hepatic   Endo/Other  Hypothyroidism    Renal/GU Renal diseaseLeft ureteral stone  negative genitourinary   Musculoskeletal negative musculoskeletal ROS (+)    Abdominal Normal abdominal exam  (+)   Peds  Hematology Lab Results      Component                Value               Date                      WBC                      9.8                 12/09/2023                HGB                      14.2                12/09/2023                HCT                      44.4                12/09/2023                MCV                      86.9                12/09/2023                PLT                      197                 12/09/2023              Anesthesia Other Findings   Reproductive/Obstetrics                             Anesthesia Physical Anesthesia Plan  ASA: 2  Anesthesia Plan: General   Post-op Pain Management: Tylenol PO (pre-op)* and Celebrex PO (pre-op)*   Induction: Intravenous  PONV Risk Score and Plan: 3 and Ondansetron, Dexamethasone, Midazolam and Treatment may vary due to age or medical condition  Airway Management Planned: Mask and LMA  Additional Equipment: None  Intra-op Plan:   Post-operative Plan: Extubation in OR  Informed Consent: I have reviewed the patients History and Physical, chart, labs and discussed the  procedure including the risks, benefits and  alternatives for the proposed anesthesia with the patient or authorized representative who has indicated his/her understanding and acceptance.     Dental advisory given  Plan Discussed with: CRNA  Anesthesia Plan Comments:        Anesthesia Quick Evaluation

## 2023-12-09 NOTE — ED Notes (Signed)
Called OR pt is scheduled for surgery at end of the day.

## 2023-12-09 NOTE — Op Note (Signed)
Operative Note  Preoperative diagnosis:  1.  3 mm left proximal ureteral calculi  Postoperative diagnosis: 1.  3 mm left renal calculi  Procedure(s): 1.  Cystoscopy with left ureteroscopy, holmium laser lithotripsy and left JJ stent placement 2.  Left retrograde pyelogram with intraoperative interpretation fluoroscopic imaging  Surgeon: Rhoderick Moody, MD  Assistants:  None  Anesthesia:  General  Complications:  None  EBL: 5 mL  Specimens: 1.  Left renal stone fragment  Drains/Catheters: 1.  Left 6 French, 26 cm JJ stent without tether  Intraoperative findings:   Solitary left collecting system with no filling defects or dilation involving the left ureter or left renal pelvis seen on retrograde pyelogram His left ureteral stones migrated into the renal pelvis.  A holmium laser was then used to fracture the stone into submillimeter pieces. No other intravesical or urethral abnormalities were seen  Indication:  Thomas Howell is a 32 y.o. male with 2, 3 mm left proximal ureteral stones with ongoing renal colic.  He has been consented for the above procedures, voices understanding and wishes to proceed.  Description of procedure:  After informed consent was obtained, the patient was brought to the operating room and general LMA anesthesia was administered. The patient was then placed in the dorsolithotomy position and prepped and draped in the usual sterile fashion. A timeout was performed. A 23 French rigid cystoscope was then inserted into the urethral meatus and advanced into the bladder under direct vision. A complete bladder survey revealed no intravesical pathology.  A 5 French ureteral catheter was then inserted into the left ureteral orifice and a retrograde pyelogram was obtained, with the findings listed above.  A Glidewire was then used to intubate the lumen of the ureteral catheter and was advanced up to the left renal pelvis, under fluoroscopic guidance.   The catheter was then removed, leaving the wire in place.  An additional sensor wire was then advanced up the left ureter and up to the left renal pelvis, under fluoroscopic guidance.  A 26 cm ureteral access sheath was then advanced over the sensor wire and into position within the proximal aspects of the left ureter, under fluoroscopic guidance.  A flexible ureteroscope was then advanced through the lumen of the access sheath and into the proximal ureter where no stone burden was identified.  The flexible scope was then advanced up to the renal pelvis where his 2 stones were identified.  A 200 m holmium laser was then used to fracture the stone into submillimeter pieces.  A larger stone for was grasped with an engage basket and retracted through the access sheath for stone analysis.  The flexible ureteroscope and ureteral access sheath were then removed under direct vision, identifying no evidence of ureteral trauma or residual luminal stone burden.  A 6 French, 26 cm JJ stent was then advanced over the wire and into good position within the left collecting system, confirming placement via fluoroscopy.  The patient's bladder was drained.  He tolerated the procedure well and was transferred to the postanesthesia in stable condition.  Plan: Okay for discharge from a urologic standpoint.  Will arrange outpatient follow-up in 1 week for office cystoscopy and stent removal.

## 2023-12-10 DIAGNOSIS — I471 Supraventricular tachycardia, unspecified: Secondary | ICD-10-CM | POA: Diagnosis not present

## 2023-12-10 DIAGNOSIS — G43009 Migraine without aura, not intractable, without status migrainosus: Secondary | ICD-10-CM | POA: Diagnosis not present

## 2023-12-10 DIAGNOSIS — N390 Urinary tract infection, site not specified: Secondary | ICD-10-CM | POA: Diagnosis not present

## 2023-12-10 DIAGNOSIS — N132 Hydronephrosis with renal and ureteral calculous obstruction: Secondary | ICD-10-CM | POA: Diagnosis not present

## 2023-12-10 LAB — CBC
HCT: 41.4 % (ref 39.0–52.0)
Hemoglobin: 13.9 g/dL (ref 13.0–17.0)
MCH: 28.8 pg (ref 26.0–34.0)
MCHC: 33.6 g/dL (ref 30.0–36.0)
MCV: 85.9 fL (ref 80.0–100.0)
Platelets: 189 10*3/uL (ref 150–400)
RBC: 4.82 MIL/uL (ref 4.22–5.81)
RDW: 14.2 % (ref 11.5–15.5)
WBC: 14.8 10*3/uL — ABNORMAL HIGH (ref 4.0–10.5)
nRBC: 0 % (ref 0.0–0.2)

## 2023-12-10 LAB — BASIC METABOLIC PANEL
Anion gap: 9 (ref 5–15)
BUN: 15 mg/dL (ref 6–20)
CO2: 26 mmol/L (ref 22–32)
Calcium: 9 mg/dL (ref 8.9–10.3)
Chloride: 100 mmol/L (ref 98–111)
Creatinine, Ser: 1.09 mg/dL (ref 0.61–1.24)
GFR, Estimated: >60 mL/min (ref >60)
Glucose, Bld: 113 mg/dL — ABNORMAL HIGH (ref 70–99)
Potassium: 4.4 mmol/L (ref 3.5–5.1)
Sodium: 135 mmol/L (ref 135–145)

## 2023-12-10 NOTE — TOC Initial Note (Signed)
Transition of Care [facility]) - Initial/Assessment Note    Patient Details  Name: Thomas Howell MRN: [id number] Date of Birth: 04-Apr-1991  [facility]) CM/SW Contact:    Adrian Prows, RN Phone Number: 12/10/2023, 11:43 AM  Clinical Narrative:                 Thomas Howell w/ pt in room; pt says he lives at home w/ his sister-in-law; he plans to return at d/c; pt identified POC Thomas Howell (mother) [phone] ; pt says he has transportation home; he denies SDOH risks; pt verified he has insurance/PCP; pt says he does not have DME, HH services, or home oxygen; no TOC needs.  Expected Discharge Plan: Home/Self Care Barriers to Discharge: No Barriers Identified   Patient Goals and CMS Choice Patient states their goals for this hospitalization and ongoing recovery are:: home          Expected Discharge Plan and Services   Discharge Planning Services: CM Consult Post Acute Care Choice: NA Living arrangements for the past 2 months: Single Family Home Expected Discharge Date: 12/10/23               DME Arranged: N/A DME Agency: NA       HH Arranged: NA HH Agency: NA        Prior Living Arrangements/Services Living arrangements for the past 2 months: Single Family Home Lives with:: Relatives Patient language and need for interpreter reviewed:: Yes Do you feel safe going back to the place where you live?: Yes      Need for Family Participation in Patient Care: Yes (Comment) Care giver support system in place?: Yes (comment) Current home services:  (n/a) Criminal Activity/Legal Involvement Pertinent to Current Situation/Hospitalization: No - Comment as needed  Activities of Daily Living   ADL Screening (condition at time of admission) Independently performs ADLs?: Yes (appropriate for developmental age) Is the patient deaf or have difficulty hearing?: No Does the patient have difficulty seeing, even when wearing glasses/contacts?: No Does the  patient have difficulty concentrating, remembering, or making decisions?: No  Permission Sought/Granted Permission sought to share information with : Case Manager Permission granted to share information with : Yes, Verbal Permission Granted  Share Information with NAME: Case Manager     Permission granted to share info w Relationship: Thomas Howell (mother) [phone]     Emotional Assessment Appearance:: Appears stated age Attitude/Demeanor/Rapport: Gracious Affect (typically observed): Accepting Orientation: : Oriented to Self, Oriented to Place, Oriented to  Time, Oriented to Situation Alcohol / Substance Use: Not Applicable Psych Involvement: No (comment)  Admission diagnosis:  Ureteral stone [N20.1] Flank pain [R10.9] Intractable abdominal pain [R10.9] Hydronephrosis concurrent with and due to calculi of kidney and ureter [N13.2] Patient Active Problem List   Diagnosis Date Noted   UTI (urinary tract infection) 12/08/2023   Hydronephrosis concurrent with and due to calculi of kidney and ureter 12/08/2023   Ureteral calculus 11/04/2021   Anxiety 06/27/2021   Right ureteral stone 10/13/2018   Acute pyelonephritis 08/14/2018   Bilateral hydronephrosis 08/14/2018   Sepsis (HCC) 08/14/2018   SVT (supraventricular tachycardia) (HCC) 05/22/2018   Attention deficit hyperactivity disorder (ADHD), combined type 04/08/2018   Chronic migraine 02/14/2017   Neck pain 02/04/2016   Hydronephrosis, left 08/19/2015   Kidney stone on left side 08/18/2015   Nephrolithiasis 08/18/2015   Kidney stone 07/06/2015   Left ureteral stone 07/06/2015   Analgesic rebound headache 06/05/2014   Migraine headache without aura 05/30/2014  PCP:  Tommie Sams, DO Pharmacy:   [facility] [phone] Ginette Otto, [place] - [address] AT [facility] OF [address] [place] [address] Phone: [phone] Fax: [phone]     Social Drivers of Health  (SDOH) Social History: SDOH Screenings   Food Insecurity: No Food Insecurity (12/10/2023)  Housing: Low Risk  (12/10/2023)  Transportation Needs: No Transportation Needs (12/10/2023)  Utilities: Not At Risk (12/10/2023)  Depression (PHQ2-9): Medium Risk (06/26/2021)  Tobacco Use: Medium Risk (12/09/2023)   SDOH Interventions: Food Insecurity Interventions: Intervention Not Indicated, Inpatient TOC Housing Interventions: Intervention Not Indicated, Inpatient TOC Transportation Interventions: Intervention Not Indicated, Inpatient TOC Utilities Interventions: Intervention Not Indicated, Inpatient TOC   Readmission Risk Interventions     No data to display

## 2023-12-10 NOTE — Plan of Care (Signed)
  Problem: Education: Goal: Knowledge of General Education information will improve Description: Including pain rating scale, medication(s)/side effects and non-pharmacologic comfort measures Outcome: Progressing   Problem: Clinical Measurements: Goal: Will remain free from infection Outcome: Progressing   Problem: Pain Management: Goal: General experience of comfort will improve Outcome: Progressing   Problem: Activity: Goal: Risk for activity intolerance will decrease Outcome: Adequate for Discharge

## 2023-12-10 NOTE — Discharge Summary (Signed)
Physician Discharge Summary   Patient: Thomas Howell MRN: [id number] DOB: Oct 13, 1991  Admit date:     12/08/2023  Discharge date: 12/10/23  Discharge Physician: York Ram Sivan Cuello   PCP: Tommie Sams, DO   Recommendations at discharge:    Patient will continue tramadol as needed for pain control and oxybutynin for bladders spasms.  Follow up with Urology in 1 week for cystoscopy and stent removal Prophylactic antibiotic prior to outpatient cystoscopy.  Follow up with Dr Adriana Simas as outpatient in 7 to 10 days.   Discharge Diagnoses: Principal Problem:   Hydronephrosis concurrent with and due to calculi of kidney and ureter Active Problems:   UTI (urinary tract infection)   SVT (supraventricular tachycardia) (HCC)   Migraine headache without aura  Resolved Problems:   * No resolved hospital problems. [facility] Course: Mr. Diles was admitted to the hospital with the working diagnosis of nephrolithiasis complicated with left hydronephrosis.   32 yo male with the past medical history of nephrolithiasis who presented with left flank pain. Reported acute onset of left flank pain, pressure type, associated with nausea and vomiting. Positive dysuria but not fever of chills. On his initial physical examination his blood pressure was 152/104, 134/88, HR 93, RR 20 and 02 saturation 97%, lungs with no wheezing or rales, heart with S1 and S2 present and regular, abdomen with no distention and no lower extremity edema.   Na 137, K 3,7 Cl 104 bicarbonate 20, glucose 109, bun 17 cr 1,18  Wbc 13 hgb 15.4 plt 242  Urine with SG 1,025, >50 wbc, small leukocytes and 6-10 rbc, small hgb.   CT renal with 2 adjacent calculi in the mid left ureter measuring 3 mm with moderate left sided hydronephrosis.  Additional bilateral non obstructing renal calculi Fatty infiltration of the liver.   Renal US with moderate left hydronephrosis.   Patient was placed on IV fluids and IV analgesics.   Urology has been consulted.   12/20 patient has not pass stone spontaneously, plan for extraction per urology.  Cystoscopy with left ureteroscopy, holmium laser lithotripsy and left JJ stent placement Left retrograde pyelogram with intraoperative interpretation fluoroscopic imaging  12/21 patient feeling better, plan for discharge home and follow with urology as outpatient.      Assessment and Plan: * Hydronephrosis concurrent with and due to calculi of kidney and ureter Sp laser lithotripsy and left internal jugular stent placement.    Overnight he required IV hydromorphone for pain control.  Follow up wbc is 14.8, urine culture with no growth.   Patient has received 2 doses of ceftriaxone 1 g. He has been afebrile.  Continue with hydrochlorothiazide and flomax.  Follow up with Urology as outpatient in 1 week for office cystoscopy and stent removal.  Continue pain control with tramadol.   UTI (urinary tract infection) Continue antibiotic therapy with ceftriaxone.  Wbc is elevated possible reactive  Urine culture with no growth.  Patient had 2 doses of IV ceftriaxone in the hospital.  Will take antibiotic (cephalexin) prior to cystoscopy as outpatient.   SVT (supraventricular tachycardia) (HCC) S/p ablation in 2018 Stable on this admission.   Migraine headache without aura C.w. rizatriptan prn         Consultants: Urology  Procedures performed:  Cystoscopy with left ureteroscopy, holmium laser lithotripsy and left JJ stent placement 2.  Left retrograde pyelogram with intraoperative interpretation fluoroscopic imaging  Disposition: Home Diet recommendation:  Regular diet DISCHARGE MEDICATION: Allergies as of 12/10/2023  Reactions   Bactrim [sulfamethoxazole-trimethoprim] Nausea And Vomiting   Ketamine Other (See Comments)   Did not like the sensation        Medication List     STOP taking these medications    phenazopyridine 200 MG  tablet Commonly known as: Pyridium   promethazine 25 MG tablet Commonly known as: PHENERGAN       TAKE these medications    acetaminophen 500 MG tablet Commonly known as: TYLENOL Take 500 mg by mouth every 6 (six) hours as needed for mild pain (pain score 1-3), moderate pain (pain score 4-6) or headache.   cephALEXin 500 MG capsule Commonly known as: KEFLEX Take 1 capsule (500 mg total) by mouth 2 (two) times daily for 3 days. Start taking the day prior to your scheduled office visit for cystoscopy and stent removal   hydrochlorothiazide 25 MG tablet Commonly known as: HYDRODIURIL Take 25 mg by mouth 2 (two) times daily.   Motrin IB 200 MG tablet Generic drug: ibuprofen Take 800 mg by mouth every 6 (six) hours as needed (for pain).   oxybutynin 5 MG tablet Commonly known as: DITROPAN Take 1 tablet (5 mg total) by mouth every 8 (eight) hours as needed for bladder spasms.   potassium chloride SA 20 MEQ tablet Commonly known as: KLOR-CON M Take 40 mEq by mouth 2 (two) times daily.   rizatriptan 10 MG disintegrating tablet Commonly known as: MAXALT-MLT DISSOLVE 1 TABLET(10 MG) ON THE TONGUE DAILY AS NEEDED FOR MIGRAINE. MAY REPEAT IN 2 HOURS AS NEEDED. MAX 2 PER 24 HOURS What changed: See the new instructions.   tamsulosin 0.4 MG Caps capsule Commonly known as: FLOMAX Take 1 capsule (0.4 mg total) by mouth at bedtime. What changed:  when to take this reasons to take this   traMADol 50 MG tablet Commonly known as: Ultram Take 1 tablet (50 mg total) by mouth every 6 (six) hours as needed for up to 5 days. What changed:  how much to take reasons to take this        Follow-up Information     [facility] Follow up in 1 week(s).   Why: The office will call to schedule your follow-up appointment in approximately 1 week for office cystoscopy and stent removal Contact information: [address] [place] [address] [phone]                Discharge Exam: Ceasar Mons Weights   12/08/23 1735  Weight: 104.3 kg   BP (!) 138/91 (BP Location: Right Leg)   Pulse 96   Temp 97.6 F (36.4 C) (Oral)   Resp 14   Ht 5\' 8"  (1.727 m)   Wt 104.3 kg   SpO2 100%   BMI 34.97 kg/m   Patient is feeling better, flank pain has been controlled with analgesics, no nausea or vomiting, no dyspnea or chest pain   Neurology awake and alert ENT with no pallor or icterus Cardiovascular with S1 and S2 present and regular with no gallops, rubs or murmurs Respiratory with no rales or wheezing Abdomen with no distention  No lower extremity edema   Condition at discharge: stable  The results of significant diagnostics from this hospitalization (including imaging, microbiology, ancillary and laboratory) are listed below for reference.   Imaging Studies: DG C-Arm 1-60 Min-No Report Result Date: 12/09/2023 Fluoroscopy was utilized by the requesting physician.  No radiographic interpretation.   US RENAL Result Date: 12/09/2023 CLINICAL DATA:  Hydronephrosis  EXAM: RENAL / URINARY TRACT ULTRASOUND COMPLETE COMPARISON:  Renal ultrasound 01/23/2022. CT abdomen and pelvis 12/08/2023. FINDINGS: Right Kidney: Renal measurements: 10.3 x 7.3 x 4.8 cm = volume: 198 mL. Echogenicity within normal limits. No mass or hydronephrosis visualized. Left Kidney: Renal measurements: 11.7 x 6.6 x 5.5 cm = volume: 5.5 mL. Echogenicity within normal limits. There is moderate hydronephrosis. No focal lesion. Bladder: Appears normal for degree of bladder distention. Other: None. IMPRESSION: Moderate left hydronephrosis. Electronically Signed   By: Darliss Cheney M.D.   On: 12/09/2023 01:54   CT Renal Stone Study Result Date: 12/08/2023 CLINICAL DATA:  Abdominal and flank pain EXAM: CT ABDOMEN AND PELVIS WITHOUT CONTRAST TECHNIQUE: Multidetector CT imaging of the abdomen and pelvis was performed following the standard protocol without IV contrast. RADIATION DOSE  REDUCTION: This exam was performed according to the departmental dose-optimization program which includes automated exposure control, adjustment of the mA and/or kV according to patient size and/or use of iterative reconstruction technique. COMPARISON:  Renal stone CT 07/27/2023 FINDINGS: Lower chest: No acute abnormality. Hepatobiliary: There is diffuse fatty infiltration of the liver. Gallbladder and bile ducts are within normal limits. Pancreas: Unremarkable. No pancreatic ductal dilatation or surrounding inflammatory changes. Spleen: Normal in size without focal abnormality. Adrenals/Urinary Tract: There are 2 adjacent calculi in the mid left ureter measuring 3 mm. There is moderate left-sided hydronephrosis. There are additional punctate bilateral renal calculi measuring up to 5 mm. There is no right-sided hydronephrosis. The adrenal glands and bladder are within normal limits. Stomach/Bowel: There is partial small-bowel malrotation with small bowel loops located in the right abdomen and colon located in the left abdomen. The appendix appears within normal limits in his located in the pelvis. There is no focal wall thickening or inflammation. The stomach is within normal limits. No bowel obstruction or free air. Vascular/Lymphatic: No significant vascular findings are present. No enlarged abdominal or pelvic lymph nodes. Reproductive: Prostate is unremarkable. Other: No abdominal wall hernia or abnormality. No abdominopelvic ascites. Musculoskeletal: There is levoconvex scoliosis of the lumbar spine. IMPRESSION: 1. There are 2 adjacent calculi in the mid left ureter measuring 3 mm with moderate left-sided hydronephrosis. 2. Additional bilateral nonobstructing renal calculi. 3. Fatty infiltration of the liver. 4. Congenital partial small-bowel malrotation. No bowel obstruction. Electronically Signed   By: Darliss Cheney M.D.   On: 12/08/2023 20:47    Microbiology: Results for orders placed or performed during  the hospital encounter of 12/08/23  Urine Culture     Status: None   Collection Time: 12/08/23  7:09 PM   Specimen: Urine, Random  Result Value Ref Range Status   Specimen Description   Final    URINE, RANDOM Performed at [facility], [address], [place], [place] [address]    Special Requests   Final    NONE Reflexed from [id number] Performed at [facility], [address] [place], [place] [address]    Culture   Final    NO GROWTH Performed at [facility], [address] [place], [place] [address]    Report Status 12/09/2023 FINAL  Final    Labs: CBC: Recent Labs  Lab 12/08/23 1750 12/09/23 0609 12/10/23 0545  WBC 13.6* 9.8 14.8*  NEUTROABS 10.7*  --   --   HGB 15.4 14.2 13.9  HCT 45.7 44.4 41.4  MCV 83.9 86.9 85.9  PLT 242 197 189   Basic Metabolic Panel: Recent Labs  Lab 12/08/23 1750 12/09/23 0609  12/10/23 0545  NA 137 135 135  K 3.7 3.7 4.4  CL 104 102 100  CO2 20* 26 26  GLUCOSE 109* 124* 113*  BUN 17 16 15   CREATININE 1.18 1.21 1.09  CALCIUM 9.6 8.5* 9.0   Liver Function Tests: Recent Labs  Lab 12/08/23 1750  AST 34  ALT 26  ALKPHOS 71  BILITOT 0.6  PROT 8.4*  ALBUMIN 4.5   CBG: No results for input(s): "GLUCAP" in the last 168 hours.  Discharge time spent: greater than 30 minutes.  Signed: Coralie Keens, MD [facility] 12/10/2023

## 2023-12-11 NOTE — Anesthesia Postprocedure Evaluation (Signed)
Anesthesia Post Note  Patient: Thomas Howell  Procedure(s) Performed: CYSTOSCOPY WITH URETEROSCOPY, RETROGRADE,STENT REPLACEMENT, HOLMIUM LASER (Left: Ureter)     Patient location during evaluation: PACU Anesthesia Type: General Level of consciousness: awake and alert Pain management: pain level controlled Vital Signs Assessment: post-procedure vital signs reviewed and stable Respiratory status: spontaneous breathing, nonlabored ventilation, respiratory function stable and patient connected to nasal cannula oxygen Cardiovascular status: blood pressure returned to baseline and stable Postop Assessment: no apparent nausea or vomiting Anesthetic complications: no   No notable events documented.  Last Vitals:  Vitals:   12/10/23 0800 12/10/23 1418  BP: (!) 138/91 (!) 148/85  Pulse: 96 100  Resp: 14   Temp: 36.4 C 37.1 C  SpO2: 100% 100%    Last Pain:  Vitals:   12/10/23 1600  TempSrc:   PainSc: 5                  Lisamarie Coke P Diego Ulbricht

## 2023-12-12 ENCOUNTER — Telehealth: Payer: Self-pay

## 2023-12-12 ENCOUNTER — Encounter (HOSPITAL_COMMUNITY): Payer: Self-pay | Admitting: Urology

## 2023-12-12 NOTE — Transitions of Care (Post Inpatient/ED Visit) (Signed)
 12/12/2023  Name: Thomas Howell MRN: [id number] DOB: January 05, 1991  Today's TOC FU Call Status: Today's TOC FU Call Status:: Successful TOC FU Call Completed TOC FU Call Complete Date: 12/12/23 Patient's Name and Date of Birth confirmed.  Transition Care Management Follow-up Telephone Call Date of Discharge: 12/10/23 Discharge Facility: [facility]) Type of Discharge: Inpatient Admission Primary Inpatient Discharge Diagnosis:: Hydronephrosis concurrent with and due to calculi of kidney and ureter How have you been since you were released from the hospital?: Better (a little sore but ok) Any questions or concerns?: No  Items Reviewed: Did you receive and understand the discharge instructions provided?: Yes Medications obtained,verified, and reconciled?: Yes (Medications Reviewed) Any new allergies since your discharge?: No Dietary orders reviewed?: NA Do you have support at home?: Yes People in Home: other relative(s)  Medications Reviewed Today: Medications Reviewed Today     Reviewed by Anthone Prieur, Jordan Hawks, CMA (Certified Medical Assistant) on 12/12/23 at 1040  Med List Status: <None>   Medication Order Taking? Sig Documenting Provider Last Dose Status Informant  acetaminophen (TYLENOL) 500 MG tablet [id number] No Take 500 mg by mouth every 6 (six) hours as needed for mild pain (pain score 1-3), moderate pain (pain score 4-6) or headache. [provider] unknown Active Self  cephALEXin (KEFLEX) 500 MG capsule [id number]  Take 1 capsule (500 mg total) by mouth 2 (two) times daily for 3 days. Start taking the day prior to your scheduled office visit for cystoscopy and stent removal Winter, Dorian Furnace, MD  Active   hydrochlorothiazide (HYDRODIURIL) 25 MG tablet [id number] No Take 25 mg by mouth 2 (two) times daily. [provider] 12/07/2023 Active Self  MOTRIN IB 200 MG tablet [id number] No Take 800 mg by mouth every 6 (six) hours as needed (for  pain). [provider] 12/08/2023 Evening Active Self  oxybutynin (DITROPAN) 5 MG tablet [id number]  Take 1 tablet (5 mg total) by mouth every 8 (eight) hours as needed for bladder spasms. Rene Paci, MD  Active   potassium chloride SA (KLOR-CON M) 20 MEQ tablet [id number] No Take 40 mEq by mouth 2 (two) times daily. [provider] 12/07/2023 Bedtime Active Self  rizatriptan (MAXALT-MLT) 10 MG disintegrating tablet [id number] No DISSOLVE 1 TABLET(10 MG) ON THE TONGUE DAILY AS NEEDED FOR MIGRAINE. MAY REPEAT IN 2 HOURS AS NEEDED. MAX 2 PER 24 HOURS  Patient taking differently: Take 10 mg by mouth daily as needed for migraine (dissolve on the tongue- may repeat once in 2 hours, if no relief (max of 2 tablets/24 hours)).   Campbell Riches, NP Past Week Active Self  tamsulosin (FLOMAX) 0.4 MG CAPS capsule [id number] No Take 1 capsule (0.4 mg total) by mouth at bedtime.  Patient taking differently: Take 0.4 mg by mouth daily as needed (AS DIRECTED- if attempting to pass a kidney stone).   Heloise Purpura, MD 12/08/2023 Active Self  traMADol (ULTRAM) 50 MG tablet [id number]  Take 1 tablet (50 mg total) by mouth every 6 (six) hours as needed for up to 5 days. Rene Paci, MD  Active             Home Care and Equipment/Supplies: Were Home Health Services Ordered?: NA Any new equipment or medical supplies ordered?: NA  Functional Questionnaire: Do you need assistance with bathing/showering or dressing?: No Do you need assistance with meal preparation?: No Do you need assistance with eating?: No Do you have difficulty maintaining continence: No Do you need  assistance with getting out of bed/getting out of a chair/moving?: No Do you have difficulty managing or taking your medications?: No  Follow up appointments reviewed: PCP Follow-up appointment confirmed?: Yes Date of PCP follow-up appointment?: 12/19/23 Follow-up Provider: Everlene Other  DO Specialist [facility] Follow-up appointment confirmed?: No Reason Specialist Follow-Up Not Confirmed: Patient has Specialist Provider Number and will Call for Appointment Do you need transportation to your follow-up appointment?: No Do you understand care options if your condition(s) worsen?: Yes-patient verbalized understanding     Huma Imhoff, CMA  [facility] AWV Team Direct Dial: [phone]

## 2023-12-19 ENCOUNTER — Inpatient Hospital Stay: Payer: BC Managed Care – PPO | Admitting: Family Medicine

## 2024-01-02 DIAGNOSIS — N200 Calculus of kidney: Secondary | ICD-10-CM | POA: Diagnosis not present

## 2024-01-04 ENCOUNTER — Inpatient Hospital Stay: Payer: BC Managed Care – PPO | Admitting: Family Medicine

## 2024-03-10 ENCOUNTER — Emergency Department (HOSPITAL_COMMUNITY)

## 2024-03-10 ENCOUNTER — Emergency Department (HOSPITAL_COMMUNITY)
Admission: EM | Admit: 2024-03-10 | Discharge: 2024-03-10 | Disposition: A | Discharge status: Home / Self Care | Attending: Emergency Medicine | Admitting: Emergency Medicine

## 2024-03-10 ENCOUNTER — Encounter (HOSPITAL_COMMUNITY): Payer: Self-pay | Admitting: Emergency Medicine

## 2024-03-10 ENCOUNTER — Other Ambulatory Visit: Payer: Self-pay

## 2024-03-10 DIAGNOSIS — N132 Hydronephrosis with renal and ureteral calculous obstruction: Secondary | ICD-10-CM | POA: Insufficient documentation

## 2024-03-10 DIAGNOSIS — R109 Unspecified abdominal pain: Secondary | ICD-10-CM | POA: Diagnosis not present

## 2024-03-10 DIAGNOSIS — N200 Calculus of kidney: Secondary | ICD-10-CM | POA: Diagnosis not present

## 2024-03-10 DIAGNOSIS — N3289 Other specified disorders of bladder: Secondary | ICD-10-CM | POA: Diagnosis not present

## 2024-03-10 LAB — URINALYSIS, ROUTINE W REFLEX MICROSCOPIC
Bilirubin Urine: NEGATIVE
Glucose, UA: NEGATIVE mg/dL
Hgb urine dipstick: NEGATIVE
Ketones, ur: NEGATIVE mg/dL
Nitrite: NEGATIVE
Protein, ur: 30 mg/dL — AB
Specific Gravity, Urine: 1.017 (ref 1.005–1.030)
WBC, UA: >50 WBC/hpf (ref 0–5)
pH: 5 (ref 5.0–8.0)

## 2024-03-10 LAB — BASIC METABOLIC PANEL
Anion gap: 8 (ref 5–15)
BUN: 14 mg/dL (ref 6–20)
CO2: 25 mmol/L (ref 22–32)
Calcium: 9.7 mg/dL (ref 8.9–10.3)
Chloride: 103 mmol/L (ref 98–111)
Creatinine, Ser: 1.14 mg/dL (ref 0.61–1.24)
GFR, Estimated: >60 mL/min (ref >60)
Glucose, Bld: 104 mg/dL — ABNORMAL HIGH (ref 70–99)
Potassium: 3.5 mmol/L (ref 3.5–5.1)
Sodium: 136 mmol/L (ref 135–145)

## 2024-03-10 LAB — CBC
HCT: 47.9 % (ref 39.0–52.0)
Hemoglobin: 16 g/dL (ref 13.0–17.0)
MCH: 28.3 pg (ref 26.0–34.0)
MCHC: 33.4 g/dL (ref 30.0–36.0)
MCV: 84.8 fL (ref 80.0–100.0)
Platelets: 221 10*3/uL (ref 150–400)
RBC: 5.65 MIL/uL (ref 4.22–5.81)
RDW: 14 % (ref 11.5–15.5)
WBC: 9.2 10*3/uL (ref 4.0–10.5)
nRBC: 0 % (ref 0.0–0.2)

## 2024-03-10 MED ORDER — OXYCODONE-ACETAMINOPHEN 5-325 MG PO TABS: 1.0000 | ORAL_TABLET | Freq: Four times a day (QID) | ORAL | 0 refills | Status: DC | PRN

## 2024-03-10 MED ORDER — ONDANSETRON HCL 4 MG/2ML IJ SOLN
4.0000 mg | Freq: Once | INTRAMUSCULAR | Status: AC
  Administered 2024-03-10: 4 mg via INTRAVENOUS
  Filled 2024-03-10: qty 2

## 2024-03-10 MED ORDER — HYDROMORPHONE HCL 1 MG/ML IJ SOLN
1.0000 mg | Freq: Once | INTRAMUSCULAR | Status: AC
  Administered 2024-03-10: 1 mg via INTRAVENOUS
  Filled 2024-03-10: qty 1

## 2024-03-10 MED ORDER — KETOROLAC TROMETHAMINE 30 MG/ML IJ SOLN
15.0000 mg | Freq: Once | INTRAMUSCULAR | Status: AC
  Administered 2024-03-10: 15 mg via INTRAVENOUS
  Filled 2024-03-10: qty 1

## 2024-03-10 NOTE — ED Notes (Signed)
 No pain relief with pain medication. Dr. Freida Busman notified

## 2024-03-10 NOTE — ED Notes (Signed)
 Pt to CT via stretcher

## 2024-03-10 NOTE — ED Provider Notes (Signed)
 [place] EMERGENCY DEPARTMENT AT [facility] Provider Note   CSN: [id number] Arrival date & time: 03/10/24  1901     History  Chief Complaint  Patient presents with   Flank Pain    Thomas Howell is a 33 y.o. male.  33 year old male with history of kidney stones presents with acute onset of left-sided flank pain which began prior to arrival.  States he feels like his prior kidney stones.  Unrelieved with his home medications.  Characterizes the pain as colicky.  No fever or chills.  Has had some nausea.  No dysuria or hematuria.       Home Medications Prior to Admission medications   Medication Sig Start Date End Date Taking? Authorizing Provider  acetaminophen (TYLENOL) 500 MG tablet Take 500 mg by mouth every 6 (six) hours as needed for mild pain (pain score 1-3), moderate pain (pain score 4-6) or headache.    [provider]  hydrochlorothiazide (HYDRODIURIL) 25 MG tablet Take 25 mg by mouth 2 (two) times daily.    [provider]  MOTRIN IB 200 MG tablet Take 800 mg by mouth every 6 (six) hours as needed (for pain).    [provider]  oxybutynin (DITROPAN) 5 MG tablet Take 1 tablet (5 mg total) by mouth every 8 (eight) hours as needed for bladder spasms. 12/09/23   Rene Paci, MD  potassium chloride SA (KLOR-CON M) 20 MEQ tablet Take 40 mEq by mouth 2 (two) times daily.    [provider]  rizatriptan (MAXALT-MLT) 10 MG disintegrating tablet DISSOLVE 1 TABLET(10 MG) ON THE TONGUE DAILY AS NEEDED FOR MIGRAINE. MAY REPEAT IN 2 HOURS AS NEEDED. MAX 2 PER 24 HOURS Patient taking differently: Take 10 mg by mouth daily as needed for migraine (dissolve on the tongue- may repeat once in 2 hours, if no relief (max of 2 tablets/24 hours)). 05/27/22   Campbell Riches, NP  tamsulosin (FLOMAX) 0.4 MG CAPS capsule Take 1 capsule (0.4 mg total) by mouth at bedtime. Patient taking differently: Take 0.4 mg by mouth daily  as needed (AS DIRECTED- if attempting to pass a kidney stone). 07/27/23   Heloise Purpura, MD      Allergies    Bactrim [sulfamethoxazole-trimethoprim] and Ketamine    Review of Systems   Review of Systems  All other systems reviewed and are negative.   Physical Exam Updated Vital Signs BP (!) 156/102 (BP Location: Right Arm)   Pulse 97   Temp 98 F (36.7 C) (Oral)   Resp 20   Ht 1.727 m (5\' 8" )   Wt 99.8 kg   SpO2 95%   BMI 33.45 kg/m  Physical Exam Vitals and nursing note reviewed.  Constitutional:      General: He is not in acute distress.    Appearance: Normal appearance. He is well-developed. He is not toxic-appearing.  HENT:     Head: Normocephalic and atraumatic.  Eyes:     General: Lids are normal.     Conjunctiva/sclera: Conjunctivae normal.     Pupils: Pupils are equal, round, and reactive to light.  Neck:     Thyroid: No thyroid mass.     Trachea: No tracheal deviation.  Cardiovascular:     Rate and Rhythm: Normal rate and regular rhythm.     Heart sounds: Normal heart sounds. No murmur heard.    No gallop.  Pulmonary:     Effort: Pulmonary effort is normal. No respiratory distress.  Breath sounds: Normal breath sounds. No stridor. No decreased breath sounds, wheezing, rhonchi or rales.  Abdominal:     General: There is no distension.     Palpations: Abdomen is soft.     Tenderness: There is no abdominal tenderness. There is left CVA tenderness. There is no rebound.  Musculoskeletal:        General: No tenderness. Normal range of motion.     Cervical back: Normal range of motion and neck supple.  Skin:    General: Skin is warm and dry.     Findings: No abrasion or rash.  Neurological:     Mental Status: He is alert and oriented to person, place, and time. Mental status is at baseline.     GCS: GCS eye subscore is 4. GCS verbal subscore is 5. GCS motor subscore is 6.     Cranial Nerves: No cranial nerve deficit.     Sensory: No sensory deficit.      Motor: Motor function is intact.  Psychiatric:        Attention and Perception: Attention normal.        Speech: Speech normal.        Behavior: Behavior normal.     ED Results / Procedures / Treatments   Labs (all labs ordered are listed, but only abnormal results are displayed) Labs Reviewed  URINALYSIS, ROUTINE W REFLEX MICROSCOPIC  BASIC METABOLIC PANEL  CBC    EKG None  Radiology No results found.  Procedures Procedures    Medications Ordered in ED Medications  HYDROmorphone (DILAUDID) injection 1 mg (has no administration in time range)  ondansetron (ZOFRAN) injection 4 mg (has no administration in time range)  ondansetron (ZOFRAN) injection 4 mg (4 mg Intravenous Given 03/10/24 1910)    ED Course/ Medical Decision Making/ A&P                                 Medical Decision Making Amount and/or Complexity of Data Reviewed Labs: ordered. Radiology: ordered.  Risk Prescription drug management.   Patient given pain control with IV hydromorphone and feels better.  Also given Toradol as well.  CT scan of pelvis shows a 3 mm left mid UVJ stone.  Patient will be discharged to home on Percocets and will follow-up with his urologist        Final Clinical Impression(s) / ED Diagnoses Final diagnoses:  None    Rx / DC Orders ED Discharge Orders     None         Lorre Nick, MD 03/10/24 2227

## 2024-03-10 NOTE — ED Triage Notes (Signed)
 Pt reports having chronic kidney stones. C/o left sided flank pain. N/V. 10/10 pain

## 2024-04-02 ENCOUNTER — Emergency Department (HOSPITAL_COMMUNITY)
Admission: EM | Admit: 2024-04-02 | Discharge: 2024-04-03 | Disposition: A | Discharge status: Home / Self Care | Attending: Emergency Medicine | Admitting: Emergency Medicine

## 2024-04-02 ENCOUNTER — Other Ambulatory Visit: Payer: Self-pay

## 2024-04-02 ENCOUNTER — Emergency Department (HOSPITAL_COMMUNITY)

## 2024-04-02 DIAGNOSIS — K760 Fatty (change of) liver, not elsewhere classified: Secondary | ICD-10-CM | POA: Diagnosis not present

## 2024-04-02 DIAGNOSIS — N200 Calculus of kidney: Secondary | ICD-10-CM

## 2024-04-02 DIAGNOSIS — N132 Hydronephrosis with renal and ureteral calculous obstruction: Secondary | ICD-10-CM | POA: Diagnosis not present

## 2024-04-02 DIAGNOSIS — R109 Unspecified abdominal pain: Secondary | ICD-10-CM | POA: Diagnosis not present

## 2024-04-02 LAB — COMPREHENSIVE METABOLIC PANEL WITH GFR
ALT: 22 U/L (ref 0–44)
AST: 26 U/L (ref 15–41)
Albumin: 3.9 g/dL (ref 3.5–5.0)
Alkaline Phosphatase: 60 U/L (ref 38–126)
Anion gap: 11 (ref 5–15)
BUN: 14 mg/dL (ref 6–20)
CO2: 24 mmol/L (ref 22–32)
Calcium: 9.4 mg/dL (ref 8.9–10.3)
Chloride: 103 mmol/L (ref 98–111)
Creatinine, Ser: 1.29 mg/dL — ABNORMAL HIGH (ref 0.61–1.24)
GFR, Estimated: >60 mL/min (ref >60)
Glucose, Bld: 104 mg/dL — ABNORMAL HIGH (ref 70–99)
Potassium: 3.7 mmol/L (ref 3.5–5.1)
Sodium: 138 mmol/L (ref 135–145)
Total Bilirubin: 0.5 mg/dL (ref 0.0–1.2)
Total Protein: 7.7 g/dL (ref 6.5–8.1)

## 2024-04-02 LAB — CBC
HCT: 45.8 % (ref 39.0–52.0)
Hemoglobin: 15.3 g/dL (ref 13.0–17.0)
MCH: 28.4 pg (ref 26.0–34.0)
MCHC: 33.4 g/dL (ref 30.0–36.0)
MCV: 85 fL (ref 80.0–100.0)
Platelets: 216 10*3/uL (ref 150–400)
RBC: 5.39 MIL/uL (ref 4.22–5.81)
RDW: 13.9 % (ref 11.5–15.5)
WBC: 9.8 10*3/uL (ref 4.0–10.5)
nRBC: 0 % (ref 0.0–0.2)

## 2024-04-02 LAB — URINALYSIS, ROUTINE W REFLEX MICROSCOPIC
Bilirubin Urine: NEGATIVE
Glucose, UA: NEGATIVE mg/dL
Ketones, ur: NEGATIVE mg/dL
Nitrite: NEGATIVE
Protein, ur: 30 mg/dL — AB
Specific Gravity, Urine: 1.018 (ref 1.005–1.030)
WBC, UA: >50 WBC/hpf (ref 0–5)
pH: 5 (ref 5.0–8.0)

## 2024-04-02 MED ORDER — ONDANSETRON 4 MG PO TBDP
8.0000 mg | ORAL_TABLET | Freq: Once | ORAL | Status: AC
  Administered 2024-04-02: 8 mg via ORAL
  Filled 2024-04-02: qty 2

## 2024-04-02 MED ORDER — KETOROLAC TROMETHAMINE 15 MG/ML IJ SOLN
15.0000 mg | Freq: Once | INTRAMUSCULAR | Status: AC
  Administered 2024-04-02: 15 mg via INTRAMUSCULAR
  Filled 2024-04-02: qty 1

## 2024-04-02 NOTE — ED Provider Triage Note (Signed)
 Emergency Medicine Provider Triage Evaluation Note  Adarius Tigges Gunnerson II , a 33 y.o. male  was evaluated in triage.  Pt complains of left flank pain beginning at 0530PM. States he feels like he has a kidney stone, significant history of the same. Has not urinated, unsure of dysuria. Endorsing nausea and vomiting. Denies fevers.  Review of Systems  Positive:  Negative:   Physical Exam  There were no vitals taken for this visit. Gen:   Awake, no distress   Resp:  Normal effort  MSK:   Moves extremities without difficulty  Other:    Medical Decision Making  Medically screening exam initiated at 9:26 PM.  Appropriate orders placed.  Alpheus Arvin Tejada II was informed that the remainder of the evaluation will be completed by another provider, this initial triage assessment does not replace that evaluation, and the importance of remaining in the ED until their evaluation is complete.     Adel Aden, PA-C 04/02/24 2127

## 2024-04-02 NOTE — ED Triage Notes (Signed)
 Pt complaining of pain in the left side of abdomen. Started at 5 pm. Nausea and vomiting

## 2024-04-03 MED ORDER — KETOROLAC TROMETHAMINE 30 MG/ML IJ SOLN
30.0000 mg | Freq: Once | INTRAMUSCULAR | Status: AC
  Administered 2024-04-03: 30 mg via INTRAVENOUS
  Filled 2024-04-03: qty 1

## 2024-04-03 MED ORDER — ONDANSETRON HCL 4 MG/2ML IJ SOLN
4.0000 mg | Freq: Once | INTRAMUSCULAR | Status: AC
  Administered 2024-04-03: 4 mg via INTRAVENOUS
  Filled 2024-04-03: qty 2

## 2024-04-03 MED ORDER — OXYCODONE-ACETAMINOPHEN 5-325 MG PO TABS: 1.0000 | ORAL_TABLET | Freq: Four times a day (QID) | ORAL | 0 refills | Status: AC | PRN

## 2024-04-03 MED ORDER — LACTATED RINGERS IV BOLUS
1000.0000 mL | Freq: Once | INTRAVENOUS | Status: AC
  Administered 2024-04-03: 1000 mL via INTRAVENOUS

## 2024-04-03 MED ORDER — ONDANSETRON 4 MG PO TBDP: 4.0000 mg | ORAL_TABLET | Freq: Three times a day (TID) | ORAL | 0 refills | Status: AC | PRN

## 2024-04-03 MED ORDER — SODIUM CHLORIDE 0.9 % IV SOLN
12.5000 mg | Freq: Four times a day (QID) | INTRAVENOUS | Status: DC | PRN
  Administered 2024-04-03: 12.5 mg via INTRAVENOUS
  Filled 2024-04-03: qty 0.5

## 2024-04-03 MED ORDER — OXYCODONE-ACETAMINOPHEN 5-325 MG PO TABS
2.0000 | ORAL_TABLET | Freq: Once | ORAL | Status: AC
  Administered 2024-04-03: 2 via ORAL
  Filled 2024-04-03 (×2): qty 2

## 2024-04-03 MED ORDER — HYDROMORPHONE HCL 1 MG/ML IJ SOLN
1.0000 mg | Freq: Once | INTRAMUSCULAR | Status: AC
  Administered 2024-04-03: 1 mg via INTRAVENOUS
  Filled 2024-04-03: qty 1

## 2024-04-03 NOTE — Discharge Instructions (Addendum)
 Please follow-up with the urologist.  Please take pain medication as prescribed.  Please have your kidney function rechecked in 1 week by your doctor.  If you develop fever or have persistent pain or vomiting, return to the emergency department.

## 2024-04-03 NOTE — ED Provider Notes (Signed)
 [facility]PT [facility] Emergency Department Provider Note MRN:  [id number]  Arrival date & time: 04/03/24     Chief Complaint   Abdominal Pain   History of Present Illness   Thomas Howell is a 33 y.o. year-old male presents to the ED with chief complaint of left-sided flank pain that started about 5:30 PM tonight.  He has history of kidney stones.  States this feels similar.  He denies dysuria.  He reports associated nausea and vomiting.  Denies any fevers or chills.  Denies any successful treatments prior to arrival.  Was seen on March 22 for the same.Aaron Aas  History provided by patient.   Review of Systems  Pertinent positive and negative review of systems noted in HPI.    Physical Exam   Vitals:   04/02/24 2140 04/03/24 0400  BP: 131/88 (!) 143/91  Pulse: (!) 105 62  Resp: 18 17  Temp: 98.3 F (36.8 C) 97.7 F (36.5 C)  SpO2: 100% 97%    CONSTITUTIONAL:  uncomfortable-appearing, NAD NEURO:  Alert and oriented x 3, CN 3-12 grossly intact EYES:  eyes equal and reactive ENT/NECK:  Supple, no stridor  CARDIO:  tachycardic, regular rhythm, appears well-perfused  PULM:  No respiratory distress, CTAB GI/GU:  non-distended,  MSK/SPINE:  No gross deformities, no edema, moves all extremities  SKIN:  no rash, atraumatic   *Additional and/or pertinent findings included in MDM below  Diagnostic and Interventional Summary    EKG Interpretation Date/Time:    Ventricular Rate:    PR Interval:    QRS Duration:    QT Interval:    QTC Calculation:   R Axis:      Text Interpretation:         Labs Reviewed  COMPREHENSIVE METABOLIC PANEL WITH GFR - Abnormal; Notable for the following components:      Result Value   Glucose, Bld 104 (*)    Creatinine, Ser 1.29 (*)    All other components within normal limits  URINALYSIS, ROUTINE W REFLEX MICROSCOPIC - Abnormal; Notable for the following components:   APPearance HAZY (*)    Hgb urine dipstick SMALL  (*)    Protein, ur 30 (*)    Leukocytes,Ua MODERATE (*)    Bacteria, UA RARE (*)    All other components within normal limits  CBC    CT Renal Stone Study  Final Result      Medications  promethazine (PHENERGAN) 12.5 mg in sodium chloride 0.9 % 50 mL IVPB (0 mg Intravenous Stopped 04/03/24 0427)  ondansetron (ZOFRAN-ODT) disintegrating tablet 8 mg (8 mg Oral Given 04/02/24 2152)  ketorolac (TORADOL) 15 MG/ML injection 15 mg (15 mg Intramuscular Given 04/02/24 2153)  ketorolac (TORADOL) 30 MG/ML injection 30 mg (30 mg Intravenous Given 04/03/24 0207)  lactated ringers bolus 1,000 mL (0 mLs Intravenous Stopped 04/03/24 0350)  HYDROmorphone (DILAUDID) injection 1 mg (1 mg Intravenous Given 04/03/24 0208)  ondansetron (ZOFRAN) injection 4 mg (4 mg Intravenous Given 04/03/24 0206)  oxyCODONE-acetaminophen (PERCOCET/ROXICET) 5-325 MG per tablet 2 tablet (2 tablets Oral Given 04/03/24 0427)     Procedures  /  Critical Care Procedures  ED Course and Medical Decision Making  I have reviewed the triage vital signs, the nursing notes, and pertinent available records from the EMR.  Social Determinants Affecting Complexity of Care: Patient has no clinically significant social determinants affecting this chief complaint..   ED Course: Clinical Course as of 04/03/24 0516  Tue Apr 03, 2024  0514  Patient reassessed several times.  He did require an additional dose of pain medicine as well as some IV Phenergan to control his nausea.  He is now feeling improved and is ready for discharge.  I will send a prescription for some Percocet. [RB]  [id number] Comprehensive metabolic panel(!) Creatinine is mildly increased at 1.29, otherwise electrolytes and LFTs are reassuring.  Will have him follow-up with PCP and urology. [RB]  0515 CBC No leukocytosis or anemia, no fever, doubt septic stone  [RB]    Clinical Course User Index [RB] Sherel Dikes, PA-C    Medical Decision Making Patient here with left-sided  flank pain.  History of kidney stones.  States this feels similar.  CT imaging performed in triage notable for 3 mm stone with significant hydronephrosis.    Amount and/or Complexity of Data Reviewed Labs:  Decision-making details documented in ED Course.  Risk Prescription drug management.         Consultants: No consultations were needed in caring for this patient.   Treatment and Plan: I considered admission due to patient's initial presentation, but after considering the examination and diagnostic results, patient will not require admission and can be discharged with outpatient follow-up.    Final Clinical Impressions(s) / ED Diagnoses     ICD-10-CM   1. Kidney stone  N20.0       ED Discharge Orders          Ordered    oxyCODONE-acetaminophen (PERCOCET) 5-325 MG tablet  Every 6 hours PRN        04/03/24 0513    ondansetron (ZOFRAN-ODT) 4 MG disintegrating tablet  Every 8 hours PRN        04/03/24 0513              Discharge Instructions Discussed with and Provided to Patient:     Discharge Instructions      Please follow-up with the urologist.  Please take pain medication as prescribed.  Please have your kidney function rechecked in 1 week by your doctor.  If you develop fever or have persistent pain or vomiting, return to the emergency department.       Sherel Dikes, PA-C 04/03/24 1610    Iva Mariner, MD 04/03/24 0800

## 2024-05-21 ENCOUNTER — Emergency Department (HOSPITAL_COMMUNITY)

## 2024-05-21 ENCOUNTER — Emergency Department (HOSPITAL_COMMUNITY)
Admission: EM | Admit: 2024-05-21 | Discharge: 2024-05-21 | Disposition: A | Discharge status: Home / Self Care | Attending: Emergency Medicine | Admitting: Emergency Medicine

## 2024-05-21 ENCOUNTER — Other Ambulatory Visit: Payer: Self-pay

## 2024-05-21 ENCOUNTER — Encounter (HOSPITAL_COMMUNITY): Payer: Self-pay

## 2024-05-21 DIAGNOSIS — N132 Hydronephrosis with renal and ureteral calculous obstruction: Secondary | ICD-10-CM | POA: Diagnosis not present

## 2024-05-21 DIAGNOSIS — R109 Unspecified abdominal pain: Secondary | ICD-10-CM | POA: Diagnosis not present

## 2024-05-21 DIAGNOSIS — N23 Unspecified renal colic: Secondary | ICD-10-CM | POA: Diagnosis not present

## 2024-05-21 LAB — CBC
HCT: 49.5 % (ref 39.0–52.0)
Hemoglobin: 16.1 g/dL (ref 13.0–17.0)
MCH: 28.6 pg (ref 26.0–34.0)
MCHC: 32.5 g/dL (ref 30.0–36.0)
MCV: 88.1 fL (ref 80.0–100.0)
Platelets: 230 10*3/uL (ref 150–400)
RBC: 5.62 MIL/uL (ref 4.22–5.81)
RDW: 13.1 % (ref 11.5–15.5)
WBC: 8.9 10*3/uL (ref 4.0–10.5)
nRBC: 0 % (ref 0.0–0.2)

## 2024-05-21 LAB — COMPREHENSIVE METABOLIC PANEL WITH GFR
ALT: 19 U/L (ref 0–44)
AST: 26 U/L (ref 15–41)
Albumin: 4.5 g/dL (ref 3.5–5.0)
Alkaline Phosphatase: 77 U/L (ref 38–126)
Anion gap: 12 (ref 5–15)
BUN: 18 mg/dL (ref 6–20)
CO2: 23 mmol/L (ref 22–32)
Calcium: 9.2 mg/dL (ref 8.9–10.3)
Chloride: 101 mmol/L (ref 98–111)
Creatinine, Ser: 1.25 mg/dL — ABNORMAL HIGH (ref 0.61–1.24)
GFR, Estimated: >60 mL/min (ref >60)
Glucose, Bld: 116 mg/dL — ABNORMAL HIGH (ref 70–99)
Potassium: 3.8 mmol/L (ref 3.5–5.1)
Sodium: 136 mmol/L (ref 135–145)
Total Bilirubin: 0.4 mg/dL (ref 0.0–1.2)
Total Protein: 8.1 g/dL (ref 6.5–8.1)

## 2024-05-21 LAB — URINALYSIS, ROUTINE W REFLEX MICROSCOPIC
Bilirubin Urine: NEGATIVE
Glucose, UA: NEGATIVE mg/dL
Ketones, ur: NEGATIVE mg/dL
Nitrite: NEGATIVE
Protein, ur: 100 mg/dL — AB
RBC / HPF: >50 RBC/hpf (ref 0–5)
Specific Gravity, Urine: 1.019 (ref 1.005–1.030)
pH: 5 (ref 5.0–8.0)

## 2024-05-21 LAB — LIPASE, BLOOD: Lipase: 27 U/L (ref 11–51)

## 2024-05-21 MED ORDER — HYDROMORPHONE HCL 1 MG/ML IJ SOLN
1.0000 mg | Freq: Once | INTRAMUSCULAR | Status: AC
  Administered 2024-05-21: 1 mg via INTRAVENOUS
  Filled 2024-05-21: qty 1

## 2024-05-21 MED ORDER — ONDANSETRON 4 MG PO TBDP: 4.0000 mg | ORAL_TABLET | Freq: Three times a day (TID) | ORAL | 0 refills | Status: AC | PRN

## 2024-05-21 MED ORDER — OXYCODONE-ACETAMINOPHEN 5-325 MG PO TABS: 1.0000 | ORAL_TABLET | Freq: Four times a day (QID) | ORAL | 0 refills | Status: AC | PRN

## 2024-05-21 MED ORDER — KETOROLAC TROMETHAMINE 15 MG/ML IJ SOLN
15.0000 mg | Freq: Once | INTRAMUSCULAR | Status: AC
  Administered 2024-05-21: 15 mg via INTRAVENOUS
  Filled 2024-05-21: qty 1

## 2024-05-21 MED ORDER — ONDANSETRON HCL 4 MG/2ML IJ SOLN
4.0000 mg | Freq: Once | INTRAMUSCULAR | Status: AC
  Administered 2024-05-21: 4 mg via INTRAVENOUS
  Filled 2024-05-21: qty 2

## 2024-05-21 MED ORDER — FENTANYL CITRATE PF 50 MCG/ML IJ SOSY
100.0000 ug | PREFILLED_SYRINGE | Freq: Once | INTRAMUSCULAR | Status: AC
  Administered 2024-05-21: 100 ug via INTRAVENOUS
  Filled 2024-05-21: qty 2

## 2024-05-21 NOTE — Discharge Instructions (Signed)
 Return for any problem.  ?

## 2024-05-21 NOTE — ED Notes (Signed)
 Patient made aware of the need of urine sample-does not have the urge to void at this time.

## 2024-05-21 NOTE — ED Triage Notes (Signed)
 Pt arrives via POV. Pt reports left flank pain, nausea, and vomiting that started this morning. PT reports hematuria as well. Hx of kidney stones.

## 2024-05-21 NOTE — ED Provider Notes (Signed)
 Seen after prior ED provider.  Patient is comfortable.  Patient believes that he just lost his kidney stone.  He desires discharge.  Importance of close follow-up was stressed.  Patient is already known to Dr. Derrick Fling with urology.  Patient declines repeat CT imaging.  He prefers discharge at this time.   Burnette Carte, MD 05/21/24 [phone]

## 2024-05-28 NOTE — ED Provider Notes (Signed)
 [place] EMERGENCY DEPARTMENT AT [facility] Provider Note   CSN: [id number] Arrival date & time: 05/21/24  1049     History  Chief Complaint  Patient presents with   Flank Pain    Thomas Howell is a 33 y.o. male.  HPI    33 year old male comes in with chief complaint of flank pain. Patient arrives with left flank pain, nausea and vomiting.  His symptoms started this morning.  He also has bloody urine.  He has history of kidney stones.  He has had multiple surgeries to help with the stone.  Patient denies any fevers or chills.  Home Medications Prior to Admission medications   Medication Sig Start Date End Date Taking? Authorizing Provider  ondansetron  (ZOFRAN -ODT) 4 MG disintegrating tablet Take 1 tablet (4 mg total) by mouth every 8 (eight) hours as needed for nausea or vomiting. 05/21/24  Yes Burnette Carte, MD  oxyCODONE -acetaminophen  (PERCOCET/ROXICET) 5-325 MG tablet Take 1 tablet by mouth every 6 (six) hours as needed for severe pain (pain score 7-10). 05/21/24  Yes Burnette Carte, MD  ondansetron  (ZOFRAN -ODT) 4 MG disintegrating tablet Take 1 tablet (4 mg total) by mouth every 8 (eight) hours as needed for nausea or vomiting. 04/03/24   Sherel Dikes, PA-C  oxybutynin  (DITROPAN ) 5 MG tablet Take 1 tablet (5 mg total) by mouth every 8 (eight) hours as needed for bladder spasms. Patient not taking: Reported on 04/02/2024 12/09/23   Adelbert Homans, MD  oxyCODONE -acetaminophen  (PERCOCET) 5-325 MG tablet Take 1-2 tablets by mouth every 6 (six) hours as needed. 04/03/24   Sherel Dikes, PA-C  rizatriptan  (MAXALT -MLT) 10 MG disintegrating tablet DISSOLVE 1 TABLET(10 MG) ON THE TONGUE DAILY AS NEEDED FOR MIGRAINE. MAY REPEAT IN 2 HOURS AS NEEDED. MAX 2 PER 24 HOURS Patient taking differently: Take 10 mg by mouth daily as needed for migraine (dissolve on the tongue- may repeat once in 2 hours, if no relief (max of 2 tablets/24 hours)). 05/27/22    Derenda Flax, NP  tamsulosin  (FLOMAX ) 0.4 MG CAPS capsule Take 1 capsule (0.4 mg total) by mouth at bedtime. Patient taking differently: Take 0.4 mg by mouth daily as needed (AS DIRECTED- if attempting to pass a kidney stone). 07/27/23   Florencio Hunting, MD      Allergies    Bactrim  [sulfamethoxazole -trimethoprim ] and Ketamine     Review of Systems   Review of Systems  All other systems reviewed and are negative.   Physical Exam Updated Vital Signs BP (!) 130/96   Pulse 80   Temp 98.7 F (37.1 C) (Oral)   Resp 18   Ht 5\' 8"  (1.727 m)   Wt 104.3 kg   SpO2 98%   BMI 34.97 kg/m  Physical Exam Vitals and nursing note reviewed.  Constitutional:      General: He is in acute distress.     Appearance: He is well-developed.  HENT:     Head: Atraumatic.  Cardiovascular:     Rate and Rhythm: Normal rate.  Pulmonary:     Effort: Pulmonary effort is normal.  Abdominal:     Tenderness: There is no abdominal tenderness.  Musculoskeletal:     Cervical back: Neck supple.  Skin:    General: Skin is warm.  Neurological:     Mental Status: He is alert and oriented to person, place, and time.     ED Results / Procedures / Treatments   Labs (all labs ordered are listed, but only  abnormal results are displayed) Labs Reviewed  COMPREHENSIVE METABOLIC PANEL WITH GFR - Abnormal; Notable for the following components:      Result Value   Glucose, Bld 116 (*)    Creatinine, Ser 1.25 (*)    All other components within normal limits  URINALYSIS, ROUTINE W REFLEX MICROSCOPIC - Abnormal; Notable for the following components:   APPearance HAZY (*)    Hgb urine dipstick LARGE (*)    Protein, ur 100 (*)    Leukocytes,Ua TRACE (*)    Bacteria, UA FEW (*)    All other components within normal limits  LIPASE, BLOOD  CBC    EKG None  Radiology No results found.  Procedures Procedures    Medications Ordered in ED Medications  fentaNYL  (SUBLIMAZE ) injection 100 mcg (100 mcg  Intravenous Given 05/21/24 1228)  ondansetron  (ZOFRAN ) injection 4 mg (4 mg Intravenous Given 05/21/24 1228)  ketorolac  (TORADOL ) 15 MG/ML injection 15 mg (15 mg Intravenous Given 05/21/24 1434)  HYDROmorphone  (DILAUDID ) injection 1 mg (1 mg Intravenous Given 05/21/24 1541)    ED Course/ Medical Decision Making/ A&P                                 Medical Decision Making Amount and/or Complexity of Data Reviewed Labs: ordered. Radiology: ordered.  Risk Prescription drug management.   33 year old male with history of multiple kidney stones that have required intervention in the past, SVT comes in with chief complaint of left-sided flank pain.  Pain is sudden onset in nature.  Based on initial assessment, differential diagnosis for this patient effectively includes kidney stone, renal colic, hydronephrosis, perinephric abscess, pyelonephritis, renal infarction.  Given his history, I suspect this is most likely colic from kidney stone.  Given his age, initial plan was to avoid CT scan.  We got ultrasound abdomen and x-ray of the abdomen to evaluate for stone pathology  Patient reassessed.  Continues to have pain.  Therefore, CT renal stone ordered with goal of focusing on continued pain management.  Patient's care has been signed out to incoming team.  I have independently interpreted the x-ray, I do not see any kidney stone.  Ultrasound is confirming hydronephrosis.  CT pending at this time.  Final Clinical Impression(s) / ED Diagnoses Final diagnoses:  Renal colic    Rx / DC Orders ED Discharge Orders          Ordered    oxyCODONE -acetaminophen  (PERCOCET/ROXICET) 5-325 MG tablet  Every 6 hours PRN        05/21/24 1623    ondansetron  (ZOFRAN -ODT) 4 MG disintegrating tablet  Every 8 hours PRN        05/21/24 1623              Deatra Face, MD 05/28/24 2030

## 2024-06-21 DIAGNOSIS — N134 Hydroureter: Secondary | ICD-10-CM | POA: Diagnosis not present

## 2024-06-21 DIAGNOSIS — R112 Nausea with vomiting, unspecified: Secondary | ICD-10-CM | POA: Diagnosis not present

## 2024-06-21 DIAGNOSIS — N132 Hydronephrosis with renal and ureteral calculous obstruction: Secondary | ICD-10-CM | POA: Diagnosis not present

## 2024-06-21 DIAGNOSIS — Z79899 Other long term (current) drug therapy: Secondary | ICD-10-CM | POA: Diagnosis not present

## 2024-10-19 ENCOUNTER — Other Ambulatory Visit (HOSPITAL_COMMUNITY): Payer: Self-pay

## 2024-10-19 MED ORDER — FLUZONE 0.5 ML IM SUSY
0.5000 mL | PREFILLED_SYRINGE | Freq: Once | INTRAMUSCULAR | 0 refills | Status: AC
  Filled 2024-10-19: qty 0.5, 1d supply, fill #0

## 2024-11-05 ENCOUNTER — Other Ambulatory Visit: Payer: Self-pay | Admitting: Pharmacist

## 2024-11-05 ENCOUNTER — Other Ambulatory Visit: Payer: Self-pay

## 2024-11-05 ENCOUNTER — Ambulatory Visit (INDEPENDENT_AMBULATORY_CARE_PROVIDER_SITE_OTHER): Admitting: Pharmacist

## 2024-11-05 ENCOUNTER — Other Ambulatory Visit (HOSPITAL_COMMUNITY): Payer: Self-pay

## 2024-11-05 DIAGNOSIS — Z79899 Other long term (current) drug therapy: Secondary | ICD-10-CM

## 2024-11-05 MED ORDER — DESCOVY 200-25 MG PO TABS
1.0000 | ORAL_TABLET | Freq: Every day | ORAL | 2 refills | Status: AC
  Filled 2024-11-05: qty 30, 30d supply, fill #0
  Filled 2024-11-29: qty 30, 30d supply, fill #1
  Filled 2024-12-27: qty 30, 30d supply, fill #2

## 2024-11-05 MED ORDER — DESCOVY 200-25 MG PO TABS
1.0000 | ORAL_TABLET | Freq: Every day | ORAL | 2 refills | Status: DC
  Filled 2024-11-05: qty 30, 30d supply, fill #0

## 2024-11-05 NOTE — Progress Notes (Addendum)
 Specialty Pharmacy Initial Fill Coordination Note  Thomas Howell is a 33 y.o. male contacted today regarding initial fill of specialty medication(s) Emtricitabine-Tenofovir AF (Descovy)   Patient requested [facility] at [facility] at [place] date: 11/06/24   Medication will be filled on 11/17.   Patient is aware of $0.00 copayment. With Copay card

## 2024-11-05 NOTE — Progress Notes (Signed)
 LVM requesting call back for initial clinical and fill. Thank you!

## 2024-11-05 NOTE — Progress Notes (Signed)
 Specialty Pharmacy Initiation Note   Thomas Howell is a 33 y.o. male who will be followed by the specialty pharmacy service for RxSp HIV PrEP    Review of administration, indication, effectiveness, safety, potential side effects, storage/disposable, and missed dose instructions occurred today for patient's specialty medication(s) Emtricitabine-Tenofovir AF (Descovy)     Patient/Caregiver did not have any additional questions or concerns.   Patient's therapy is appropriate to: Initiate    Goals Addressed             This Visit's Progress    Comply with lab assessments       Patient is on track. Patient will adhere to provider and/or lab appointments      Maintain optimal adherence to therapy       Patient is initiating therapy. Patient will be evaluated at upcoming provider appointment to assess progress      Practice safe sex       Patient is initiating therapy. Patient will be evaluated at upcoming provider appointment to assess progress         Alan JINNY Geralds Specialty Pharmacist

## 2024-11-05 NOTE — Progress Notes (Signed)
 Virtual Visit via Telephone Note  I connected with  Thomas Howell on 11/05/24 at  1:30 PM EST by telephone and verified that I am speaking with the correct person using two identifiers.  Location: Patient: Office Provider: Clinic   I discussed the limitations, risks, security and privacy concerns of performing an evaluation and management service by telephone and the availability of in person appointments. I also discussed with the patient that there may be a patient responsible charge related to this service. The patient expressed understanding and agreed to proceed.  Date:  11/05/2024   HPI: Thomas Howell is a 33 y.o. male who presents for follow-up of their specialty PrEP medication, Descovy.  Patient Active Problem List   Diagnosis Date Noted   UTI (urinary tract infection) 12/08/2023   Hydronephrosis concurrent with and due to calculi of kidney and ureter 12/08/2023   Ureteral calculus 11/04/2021   Anxiety 06/27/2021   Right ureteral stone 10/13/2018   Acute pyelonephritis 08/14/2018   Bilateral hydronephrosis 08/14/2018   Sepsis (HCC) 08/14/2018   SVT (supraventricular tachycardia) 05/22/2018   Attention deficit hyperactivity disorder (ADHD), combined type 04/08/2018   Chronic migraine 02/14/2017   Neck pain 02/04/2016   Hydronephrosis, left 08/19/2015   Kidney stone on left side 08/18/2015   Nephrolithiasis 08/18/2015   Kidney stone 07/06/2015   Left ureteral stone 07/06/2015   Analgesic rebound headache 06/05/2014   Migraine headache without aura 05/30/2014    Patient's Medications  New Prescriptions   No medications on file  Previous Medications   ONDANSETRON  (ZOFRAN -ODT) 4 MG DISINTEGRATING TABLET    Take 1 tablet (4 mg total) by mouth every 8 (eight) hours as needed for nausea or vomiting.   ONDANSETRON  (ZOFRAN -ODT) 4 MG DISINTEGRATING TABLET    Take 1 tablet (4 mg total) by mouth every 8 (eight) hours as needed for nausea or vomiting.    OXYBUTYNIN  (DITROPAN ) 5 MG TABLET    Take 1 tablet (5 mg total) by mouth every 8 (eight) hours as needed for bladder spasms.   OXYCODONE -ACETAMINOPHEN  (PERCOCET) 5-325 MG TABLET    Take 1-2 tablets by mouth every 6 (six) hours as needed.   OXYCODONE -ACETAMINOPHEN  (PERCOCET/ROXICET) 5-325 MG TABLET    Take 1 tablet by mouth every 6 (six) hours as needed for severe pain (pain score 7-10).   RIZATRIPTAN  (MAXALT -MLT) 10 MG DISINTEGRATING TABLET    DISSOLVE 1 TABLET(10 MG) ON THE TONGUE DAILY AS NEEDED FOR MIGRAINE. MAY REPEAT IN 2 HOURS AS NEEDED. MAX 2 PER 24 HOURS   TAMSULOSIN  (FLOMAX ) 0.4 MG CAPS CAPSULE    Take 1 capsule (0.4 mg total) by mouth at bedtime.  Modified Medications   Modified Medication Previous Medication   DESCOVY 200-25 MG TABLET DESCOVY 200-25 MG tablet      Take 1 tablet by mouth daily.    Take 1 tablet by mouth daily.  Discontinued Medications   No medications on file    Allergies: Allergies  Allergen Reactions   Bactrim  [Sulfamethoxazole -Trimethoprim ] Nausea And Vomiting   Ketamine  Other (See Comments)    Did not like the sensation    Past Medical History: Past Medical History:  Diagnosis Date   Complication of anesthesia    Dysrhythmia    hx of SVT ablation in 2019 - DR Danelle Birmingham dismissed by MD    Frequency-urgency syndrome    History of kidney stones    Hx of nausea and vomiting    d/t kidney stone   Hx of  sepsis 08/11/2017   due to kidney stone/ hydronephrosis   Hypothyroidism    Left ureteral stone    Migraine    hx of migraines    Pain due to ureteral stent    PONV (postoperative nausea and vomiting)    none recently   Scoliosis of lumbar spine 1994   treated at [facility] until age 18   Wears glasses    reading only    Social History: Social History   Socioeconomic History   Marital status: Single    Spouse name: Not on file   Number of children: Not on file   Years of education: Not on file   Highest education level: Not on file   Occupational History   Not on file  Tobacco Use   Smoking status: Former    Current packs/day: 0.00    Types: Cigarettes    Start date: 07/29/2012    Quit date: 07/29/2014    Years since quitting: 10.2   Smokeless tobacco: Never   Tobacco comments:    social smoker  Vaping Use   Vaping status: Every Day   Substances: Flavoring  Substance and Sexual Activity   Alcohol use: Not Currently    Alcohol/week: 0.0 standard drinks of alcohol   Drug use: No   Sexual activity: Yes  Other Topics Concern   Not on file  Social History Narrative   Not on file   Social Drivers of Health   Financial Resource Strain: Not on file  Food Insecurity: No Food Insecurity (12/10/2023)   Hunger Vital Sign    Worried About Running Out of Food in the Last Year: Never true    Ran Out of Food in the Last Year: Never true  Transportation Needs: No Transportation Needs (12/10/2023)   PRAPARE - Administrator, Civil Service (Medical): No    Lack of Transportation (Non-Medical): No  Physical Activity: Not on file  Stress: Not on file  Social Connections: Not on file        No data to display          Labs:  SCr: Lab Results  Component Value Date   CREATININE 1.25 (H) 05/21/2024   CREATININE 1.29 (H) 04/02/2024   CREATININE 1.14 03/10/2024   CREATININE 1.09 12/10/2023   CREATININE 1.21 12/09/2023   HIV Lab Results  Component Value Date   HIV Non Reactive 12/09/2023   HIV Non Reactive 11/04/2021   HIV Non Reactive 08/15/2018   Hepatitis B No results found for: HEPBSAB, HEPBSAG, HEPBCAB Hepatitis C No results found for: HEPCAB, HCVRNAPCRQN Hepatitis A No results found for: HAV RPR and STI No results found for: LABRPR, RPRTITER      No data to display          Assessment: I spoke with Nelwyn today regarding their specialty medication, Descovy for HIV prevention. This will be a new medication for him. Counseled patient that Descovy is a one pill  once daily regimen with or without food that can prevent HIV. Discussed the importance of taking the medication daily to provide protection and decreased adherence is associated with decreased efficacy.Counseled patient that Descovy is normally well tolerated, however some patients experience a start up syndrome with nausea, diarrhea, dizziness, and fatigue but that those should resolve soon after starting.  Advised that any nausea can be mitigated by taking it with food.    Updated/reviewed medication list - no drug interactions. All questions answered. Will follow up in 1 year.  Plan: - Initiate Descovy  - Follow up in 1 year  I discussed the assessment and treatment plan with the patient. The patient was provided an opportunity to ask questions and all were answered. The patient agreed with the plan and demonstrated an understanding of the instructions.   The patient was advised to call back or seek an in-person evaluation if the symptoms worsen or if the condition fails to improve as anticipated.  I provided 15 minutes of non-face-to-face time during this encounter.  Alan Geralds, PharmD, CPP, BCIDP, AAHIVP Clinical Pharmacist Practitioner Infectious Diseases Clinical Pharmacist [facility]

## 2024-11-06 ENCOUNTER — Other Ambulatory Visit: Payer: Self-pay

## 2024-11-29 ENCOUNTER — Other Ambulatory Visit (HOSPITAL_COMMUNITY): Payer: Self-pay

## 2024-11-30 ENCOUNTER — Other Ambulatory Visit (HOSPITAL_COMMUNITY): Payer: Self-pay

## 2024-11-30 DIAGNOSIS — J029 Acute pharyngitis, unspecified: Secondary | ICD-10-CM | POA: Diagnosis not present

## 2024-11-30 MED ORDER — AMOXICILLIN 875 MG PO TABS
875.0000 mg | ORAL_TABLET | Freq: Two times a day (BID) | ORAL | 0 refills | Status: AC
  Filled 2024-11-30: qty 14, 7d supply, fill #0

## 2024-12-03 ENCOUNTER — Other Ambulatory Visit: Payer: Self-pay

## 2024-12-03 ENCOUNTER — Other Ambulatory Visit (HOSPITAL_COMMUNITY): Payer: Self-pay

## 2024-12-03 NOTE — Progress Notes (Signed)
 Specialty Pharmacy Refill Coordination Note  Thomas Howell is a 33 y.o. male contacted today regarding refills of specialty medication(s) Emtricitabine -Tenofovir  AF (Descovy )   Patient requested Pickup at [facility] at [place] date: 12/03/24   Medication will be filled on: 12/03/24

## 2024-12-26 ENCOUNTER — Other Ambulatory Visit (HOSPITAL_COMMUNITY): Payer: Self-pay

## 2024-12-26 MED ORDER — CHLORHEXIDINE GLUCONATE 0.12 % MT SOLN
15.0000 mL | Freq: Two times a day (BID) | OROMUCOSAL | 99 refills | Status: AC
  Filled 2024-12-26: qty 473, 16d supply, fill #0

## 2024-12-26 MED ORDER — AMOXICILLIN 500 MG PO CAPS
500.0000 mg | ORAL_CAPSULE | Freq: Three times a day (TID) | ORAL | 0 refills | Status: AC
  Filled 2024-12-26: qty 21, 7d supply, fill #0

## 2024-12-27 ENCOUNTER — Other Ambulatory Visit (HOSPITAL_COMMUNITY): Payer: Self-pay

## 2024-12-31 ENCOUNTER — Other Ambulatory Visit (HOSPITAL_COMMUNITY): Payer: Self-pay

## 2025-01-01 ENCOUNTER — Other Ambulatory Visit: Payer: Self-pay

## 2025-01-01 NOTE — Progress Notes (Signed)
 Specialty Pharmacy Refill Coordination Note  Thomas Howell is a 34 y.o. male contacted today regarding refills of specialty medication(s) Emtricitabine -Tenofovir  AF (Descovy )   Patient requested Pickup at [facility] at [place] date: 01/01/25   Medication will be filled on: 01/01/25

## 2025-01-01 NOTE — Progress Notes (Signed)
 Clinical Intervention Note  Clinical Intervention Notes: Patient reported being on a short course of amoxicillin  and chlorhexidine  rinse following wisdom tooth removal. No DDIs identified with Descovy .   Clinical Intervention Outcomes: Prevention of an adverse drug event   Advertising Account Planner

## 2025-01-25 ENCOUNTER — Other Ambulatory Visit (HOSPITAL_COMMUNITY): Payer: Self-pay
